# Patient Record
Sex: Male | Born: 1945 | Race: White | Hispanic: No | Marital: Married | Smoking: Former smoker
Health system: Southern US, Community
[De-identification: ages and names within clinical notes are randomized; demographics above are authoritative.]

## PROBLEM LIST (undated history)

## (undated) DIAGNOSIS — I340 Nonrheumatic mitral (valve) insufficiency: Secondary | ICD-10-CM

## (undated) DIAGNOSIS — C679 Malignant neoplasm of bladder, unspecified: Secondary | ICD-10-CM

## (undated) DIAGNOSIS — Z860100 Personal history of colon polyps, unspecified: Secondary | ICD-10-CM

## (undated) DIAGNOSIS — I739 Peripheral vascular disease, unspecified: Secondary | ICD-10-CM

## (undated) DIAGNOSIS — E559 Vitamin D deficiency, unspecified: Secondary | ICD-10-CM

## (undated) DIAGNOSIS — G4733 Obstructive sleep apnea (adult) (pediatric): Secondary | ICD-10-CM

## (undated) DIAGNOSIS — E785 Hyperlipidemia, unspecified: Secondary | ICD-10-CM

## (undated) DIAGNOSIS — H9193 Unspecified hearing loss, bilateral: Secondary | ICD-10-CM

## (undated) DIAGNOSIS — I480 Paroxysmal atrial fibrillation: Secondary | ICD-10-CM

## (undated) DIAGNOSIS — Z9989 Dependence on other enabling machines and devices: Secondary | ICD-10-CM

## (undated) DIAGNOSIS — R7303 Prediabetes: Secondary | ICD-10-CM

## (undated) DIAGNOSIS — I10 Essential (primary) hypertension: Secondary | ICD-10-CM

## (undated) HISTORY — DX: Nonrheumatic mitral (valve) insufficiency: I34.0

## (undated) HISTORY — DX: Hyperlipidemia, unspecified: E78.5

## (undated) HISTORY — DX: Vitamin D deficiency, unspecified: E55.9

## (undated) HISTORY — PX: TONSILLECTOMY: SUR1361

## (undated) HISTORY — PX: CARDIAC ELECTROPHYSIOLOGY STUDY AND ABLATION: SHX1294

## (undated) HISTORY — PX: CATARACT EXTRACTION W/ INTRAOCULAR LENS  IMPLANT, BILATERAL: SHX1307

## (undated) HISTORY — DX: Prediabetes: R73.03

## (undated) HISTORY — PX: JOINT REPLACEMENT: SHX530

---

## 1998-05-14 ENCOUNTER — Encounter: Payer: Self-pay | Admitting: Internal Medicine

## 1998-05-14 ENCOUNTER — Ambulatory Visit (HOSPITAL_COMMUNITY): Admission: RE | Admit: 1998-05-14 | Discharge: 1998-05-14 | Payer: Self-pay | Admitting: Internal Medicine

## 1999-05-17 ENCOUNTER — Ambulatory Visit (HOSPITAL_COMMUNITY): Admission: RE | Admit: 1999-05-17 | Discharge: 1999-05-17 | Payer: Self-pay | Admitting: Internal Medicine

## 1999-05-17 ENCOUNTER — Encounter: Payer: Self-pay | Admitting: Internal Medicine

## 2001-07-06 ENCOUNTER — Encounter: Payer: Self-pay | Admitting: Internal Medicine

## 2001-07-06 ENCOUNTER — Ambulatory Visit (HOSPITAL_COMMUNITY): Admission: RE | Admit: 2001-07-06 | Discharge: 2001-07-06 | Payer: Self-pay | Admitting: Internal Medicine

## 2002-04-04 ENCOUNTER — Encounter: Payer: Self-pay | Admitting: Internal Medicine

## 2002-04-04 ENCOUNTER — Ambulatory Visit (HOSPITAL_COMMUNITY): Admission: RE | Admit: 2002-04-04 | Discharge: 2002-04-04 | Payer: Self-pay | Admitting: Internal Medicine

## 2007-11-08 ENCOUNTER — Ambulatory Visit (HOSPITAL_COMMUNITY): Admission: RE | Admit: 2007-11-08 | Discharge: 2007-11-08 | Payer: Self-pay | Admitting: Gastroenterology

## 2007-11-08 ENCOUNTER — Encounter (INDEPENDENT_AMBULATORY_CARE_PROVIDER_SITE_OTHER): Payer: Self-pay | Admitting: Gastroenterology

## 2008-07-05 ENCOUNTER — Ambulatory Visit: Payer: Self-pay | Admitting: Internal Medicine

## 2008-08-03 ENCOUNTER — Ambulatory Visit: Payer: Self-pay | Admitting: Internal Medicine

## 2009-06-08 ENCOUNTER — Ambulatory Visit: Payer: Self-pay | Admitting: Internal Medicine

## 2009-07-10 ENCOUNTER — Ambulatory Visit: Payer: Self-pay | Admitting: Internal Medicine

## 2009-10-01 ENCOUNTER — Encounter: Admission: RE | Admit: 2009-10-01 | Discharge: 2009-10-01 | Payer: Self-pay | Admitting: Orthopaedic Surgery

## 2009-11-28 ENCOUNTER — Ambulatory Visit: Payer: Self-pay | Admitting: Internal Medicine

## 2010-02-08 ENCOUNTER — Inpatient Hospital Stay (HOSPITAL_COMMUNITY): Admission: RE | Admit: 2010-02-08 | Discharge: 2010-02-11 | Payer: Self-pay | Admitting: Orthopaedic Surgery

## 2010-02-08 HISTORY — PX: TOTAL HIP ARTHROPLASTY: SHX124

## 2010-10-05 LAB — CBC
HCT: 34.9 % — ABNORMAL LOW (ref 39.0–52.0)
HCT: 37.4 % — ABNORMAL LOW (ref 39.0–52.0)
Hemoglobin: 12.4 g/dL — ABNORMAL LOW (ref 13.0–17.0)
MCH: 30.4 pg (ref 26.0–34.0)
MCHC: 34.2 g/dL (ref 30.0–36.0)
MCHC: 35.5 g/dL (ref 30.0–36.0)
Platelets: 197 10*3/uL (ref 150–400)
RBC: 3.94 MIL/uL — ABNORMAL LOW (ref 4.22–5.81)
RBC: 3.97 MIL/uL — ABNORMAL LOW (ref 4.22–5.81)
RBC: 4.21 MIL/uL — ABNORMAL LOW (ref 4.22–5.81)
RDW: 12.8 % (ref 11.5–15.5)
RDW: 13 % (ref 11.5–15.5)
WBC: 10.1 10*3/uL (ref 4.0–10.5)

## 2010-10-05 LAB — BASIC METABOLIC PANEL
CO2: 27 mEq/L (ref 19–32)
Calcium: 8.2 mg/dL — ABNORMAL LOW (ref 8.4–10.5)
Calcium: 8.3 mg/dL — ABNORMAL LOW (ref 8.4–10.5)
Chloride: 102 mEq/L (ref 96–112)
Chloride: 103 mEq/L (ref 96–112)
Chloride: 105 mEq/L (ref 96–112)
Creatinine, Ser: 0.88 mg/dL (ref 0.4–1.5)
Creatinine, Ser: 0.9 mg/dL (ref 0.4–1.5)
GFR calc non Af Amer: >60 mL/min (ref >60)
Glucose, Bld: 121 mg/dL — ABNORMAL HIGH (ref 70–99)
Glucose, Bld: 147 mg/dL — ABNORMAL HIGH (ref 70–99)
Potassium: 3.4 mEq/L — ABNORMAL LOW (ref 3.5–5.1)
Sodium: 138 mEq/L (ref 135–145)
Sodium: 138 mEq/L (ref 135–145)
Sodium: 138 mEq/L (ref 135–145)

## 2010-10-05 LAB — PROTIME-INR: Prothrombin Time: 18.3 seconds — ABNORMAL HIGH (ref 11.6–15.2)

## 2010-10-06 LAB — CBC
MCH: 30.9 pg (ref 26.0–34.0)
MCHC: 34.5 g/dL (ref 30.0–36.0)
MCV: 89.5 fL (ref 78.0–100.0)
WBC: 6.8 10*3/uL (ref 4.0–10.5)

## 2010-10-06 LAB — URINALYSIS, ROUTINE W REFLEX MICROSCOPIC
Bilirubin Urine: NEGATIVE
Hgb urine dipstick: NEGATIVE
Ketones, ur: NEGATIVE mg/dL
Nitrite: NEGATIVE
Specific Gravity, Urine: 1.021 (ref 1.005–1.030)

## 2010-10-06 LAB — DIFFERENTIAL
Basophils Relative: 1 % (ref 0–1)
Eosinophils Absolute: 0.3 10*3/uL (ref 0.0–0.7)
Eosinophils Relative: 4 % (ref 0–5)
Monocytes Absolute: 0.6 10*3/uL (ref 0.1–1.0)

## 2010-10-06 LAB — BASIC METABOLIC PANEL
CO2: 26 mEq/L (ref 19–32)
Calcium: 9.3 mg/dL (ref 8.4–10.5)
Chloride: 108 mEq/L (ref 96–112)
GFR calc Af Amer: >60 mL/min (ref >60)
GFR calc non Af Amer: >60 mL/min (ref >60)
Glucose, Bld: 91 mg/dL (ref 70–99)

## 2010-10-06 LAB — SURGICAL PCR SCREEN
MRSA, PCR: NEGATIVE
Staphylococcus aureus: NEGATIVE

## 2010-10-06 LAB — PROTIME-INR
INR: 1.03 (ref 0.00–1.49)
Prothrombin Time: 13.4 seconds (ref 11.6–15.2)

## 2011-06-10 ENCOUNTER — Ambulatory Visit (HOSPITAL_COMMUNITY)
Admission: RE | Admit: 2011-06-10 | Discharge: 2011-06-10 | Disposition: A | Discharge status: Home / Self Care | Payer: Medicare Other | Source: Ambulatory Visit | Attending: Internal Medicine | Admitting: Internal Medicine

## 2011-06-10 ENCOUNTER — Other Ambulatory Visit (HOSPITAL_COMMUNITY): Payer: Self-pay | Admitting: Internal Medicine

## 2011-06-10 DIAGNOSIS — I10 Essential (primary) hypertension: Secondary | ICD-10-CM

## 2011-06-10 DIAGNOSIS — R0902 Hypoxemia: Secondary | ICD-10-CM | POA: Insufficient documentation

## 2011-06-17 ENCOUNTER — Other Ambulatory Visit (HOSPITAL_COMMUNITY): Payer: Self-pay | Admitting: Orthopaedic Surgery

## 2011-07-11 ENCOUNTER — Encounter (HOSPITAL_COMMUNITY): Payer: Self-pay

## 2011-07-11 ENCOUNTER — Encounter (HOSPITAL_COMMUNITY)
Admission: RE | Admit: 2011-07-11 | Discharge: 2011-07-11 | Payer: Medicare Other | Source: Ambulatory Visit | Attending: Orthopaedic Surgery | Admitting: Orthopaedic Surgery

## 2011-07-11 HISTORY — DX: Essential (primary) hypertension: I10

## 2011-07-11 LAB — CBC
HCT: 42.1 % (ref 39.0–52.0)
Hemoglobin: 14.3 g/dL (ref 13.0–17.0)
MCH: 29.7 pg (ref 26.0–34.0)
MCHC: 34 g/dL (ref 30.0–36.0)

## 2011-07-11 LAB — URINALYSIS, ROUTINE W REFLEX MICROSCOPIC
Bilirubin Urine: NEGATIVE
Glucose, UA: NEGATIVE mg/dL
Hgb urine dipstick: NEGATIVE
Ketones, ur: NEGATIVE mg/dL
Protein, ur: NEGATIVE mg/dL

## 2011-07-11 LAB — BASIC METABOLIC PANEL
BUN: 13 mg/dL (ref 6–23)
GFR calc non Af Amer: 89 mL/min — ABNORMAL LOW (ref >90)
Glucose, Bld: 83 mg/dL (ref 70–99)
Potassium: 3.9 mEq/L (ref 3.5–5.1)

## 2011-07-11 LAB — PROTIME-INR: INR: 1.19 (ref 0.00–1.49)

## 2011-07-11 NOTE — Patient Instructions (Signed)
20 ANIK WESCH  07/11/2011   Your procedure is scheduled on: 07-25-11  Report to [facility] at 0800 AM.  Call this number if you have problems the morning of surgery: [phone]   Remember:   Do not eat food:After Midnight.  May have clear liquids:until Midnight .  Clear liquids include soda, tea, black coffee, apple or grape juice, broth.  Take these medicines the morning of surgery with A SIP OF WATER: none.Bring Cpap mask to hospital.   Do not wear jewelry, make-up or nail polish.  Do not wear lotions, powders, or perfumes. You may wear deodorant.  Do not shave 48 hours prior to surgery.  Do not bring valuables to the hospital.  Contacts, dentures or bridgework may not be worn into surgery.  Leave suitcase in the car. After surgery it may be brought to your room.  For patients admitted to the hospital, checkout time is 11:00 AM the day of discharge.   Patients discharged the day of surgery will not be allowed to drive home.  Name and phone number of your driver: spouse  Special Instructions: CHG Shower Use Special Wash: 1/2 bottle night before surgery and 1/2 bottle morning of surgery.   Please read over the following fact sheets that you were given: Blood Transfusion Information and MRSA Information

## 2011-07-11 NOTE — Pre-Procedure Instructions (Signed)
07-11-11 ION(6'29) report with chart.

## 2011-07-12 ENCOUNTER — Other Ambulatory Visit (HOSPITAL_COMMUNITY): Payer: Medicare Other

## 2011-07-25 ENCOUNTER — Encounter (HOSPITAL_COMMUNITY): Payer: Self-pay | Admitting: *Deleted

## 2011-07-25 ENCOUNTER — Encounter (HOSPITAL_COMMUNITY): Payer: Self-pay | Admitting: Anesthesiology

## 2011-07-25 ENCOUNTER — Encounter (HOSPITAL_COMMUNITY)
Admission: RE | Disposition: A | Discharge status: Home Health | Payer: Self-pay | Source: Ambulatory Visit | Attending: Orthopaedic Surgery

## 2011-07-25 ENCOUNTER — Inpatient Hospital Stay (HOSPITAL_COMMUNITY)
Admission: RE | Admit: 2011-07-25 | Discharge: 2011-07-28 | DRG: 470 | Disposition: A | Discharge status: Home Health | Payer: Medicare Other | Source: Ambulatory Visit | Attending: Orthopaedic Surgery | Admitting: Orthopaedic Surgery

## 2011-07-25 ENCOUNTER — Inpatient Hospital Stay (HOSPITAL_COMMUNITY): Payer: Medicare Other | Admitting: Anesthesiology

## 2011-07-25 ENCOUNTER — Inpatient Hospital Stay (HOSPITAL_COMMUNITY): Payer: Medicare Other

## 2011-07-25 DIAGNOSIS — I10 Essential (primary) hypertension: Secondary | ICD-10-CM | POA: Diagnosis present

## 2011-07-25 DIAGNOSIS — H9190 Unspecified hearing loss, unspecified ear: Secondary | ICD-10-CM | POA: Diagnosis present

## 2011-07-25 DIAGNOSIS — I4891 Unspecified atrial fibrillation: Secondary | ICD-10-CM | POA: Diagnosis present

## 2011-07-25 DIAGNOSIS — G4730 Sleep apnea, unspecified: Secondary | ICD-10-CM | POA: Diagnosis present

## 2011-07-25 DIAGNOSIS — M1610 Unilateral primary osteoarthritis, unspecified hip: Principal | ICD-10-CM | POA: Diagnosis present

## 2011-07-25 DIAGNOSIS — M169 Osteoarthritis of hip, unspecified: Secondary | ICD-10-CM

## 2011-07-25 HISTORY — PX: TOTAL HIP ARTHROPLASTY: SHX124

## 2011-07-25 LAB — ABO/RH: ABO/RH(D): O POS

## 2011-07-25 SURGERY — ARTHROPLASTY, HIP, TOTAL, ANTERIOR APPROACH
Anesthesia: General | Site: Hip | Laterality: Right | Wound class: Clean

## 2011-07-25 MED ORDER — DILTIAZEM HCL ER 120 MG PO CP24
120.0000 mg | ORAL_CAPSULE | Freq: Every evening | ORAL | Status: DC
  Administered 2011-07-25 – 2011-07-27 (×3): 120 mg via ORAL
  Filled 2011-07-25 (×4): qty 1

## 2011-07-25 MED ORDER — RIVAROXABAN 10 MG PO TABS
10.0000 mg | ORAL_TABLET | Freq: Every day | ORAL | Status: DC
  Administered 2011-07-26 – 2011-07-28 (×3): 10 mg via ORAL
  Filled 2011-07-25 (×3): qty 1

## 2011-07-25 MED ORDER — HYDROMORPHONE HCL PF 1 MG/ML IJ SOLN
INTRAMUSCULAR | Status: DC | PRN
  Administered 2011-07-25 (×2): 1 mg via INTRAVENOUS

## 2011-07-25 MED ORDER — METHOCARBAMOL 500 MG PO TABS
500.0000 mg | ORAL_TABLET | Freq: Four times a day (QID) | ORAL | Status: DC | PRN
  Administered 2011-07-26 – 2011-07-28 (×2): 500 mg via ORAL
  Filled 2011-07-25 (×2): qty 1

## 2011-07-25 MED ORDER — METOCLOPRAMIDE HCL 10 MG PO TABS: 5.0000 mg | ORAL_TABLET | Freq: Three times a day (TID) | ORAL | Status: DC | PRN

## 2011-07-25 MED ORDER — SENNA 8.6 MG PO TABS
1.0000 | ORAL_TABLET | Freq: Two times a day (BID) | ORAL | Status: DC
  Administered 2011-07-25 – 2011-07-28 (×6): 8.6 mg via ORAL
  Filled 2011-07-25 (×6): qty 1

## 2011-07-25 MED ORDER — OXYCODONE HCL 5 MG PO TABS: 5.0000 mg | ORAL_TABLET | ORAL | Status: DC | PRN

## 2011-07-25 MED ORDER — ACETAMINOPHEN 325 MG PO TABS: 650.0000 mg | ORAL_TABLET | Freq: Four times a day (QID) | ORAL | Status: DC | PRN

## 2011-07-25 MED ORDER — HETASTARCH-ELECTROLYTES 6 % IV SOLN
INTRAVENOUS | Status: DC | PRN
  Administered 2011-07-25: 11:00:00 via INTRAVENOUS

## 2011-07-25 MED ORDER — ZOLPIDEM TARTRATE 5 MG PO TABS: 5.0000 mg | ORAL_TABLET | Freq: Every evening | ORAL | Status: DC | PRN

## 2011-07-25 MED ORDER — CEFAZOLIN SODIUM 1-5 GM-% IV SOLN
1.0000 g | Freq: Four times a day (QID) | INTRAVENOUS | Status: AC
  Administered 2011-07-25 – 2011-07-26 (×3): 1 g via INTRAVENOUS
  Filled 2011-07-25 (×5): qty 50

## 2011-07-25 MED ORDER — ROCURONIUM BROMIDE 100 MG/10ML IV SOLN
INTRAVENOUS | Status: DC | PRN
  Administered 2011-07-25: 60 mg via INTRAVENOUS

## 2011-07-25 MED ORDER — ONDANSETRON HCL 4 MG/2ML IJ SOLN
4.0000 mg | Freq: Four times a day (QID) | INTRAMUSCULAR | Status: DC | PRN
Start: 2011-07-25 — End: 2011-07-28

## 2011-07-25 MED ORDER — ALUM & MAG HYDROXIDE-SIMETH 200-200-20 MG/5ML PO SUSP: 30.0000 mL | ORAL | Status: DC | PRN

## 2011-07-25 MED ORDER — PROPOFOL 10 MG/ML IV EMUL
INTRAVENOUS | Status: DC | PRN
  Administered 2011-07-25: 180 mg via INTRAVENOUS

## 2011-07-25 MED ORDER — ROSUVASTATIN CALCIUM 20 MG PO TABS
20.0000 mg | ORAL_TABLET | Freq: Every evening | ORAL | Status: DC
  Administered 2011-07-25 – 2011-07-27 (×3): 20 mg via ORAL
  Filled 2011-07-25 (×4): qty 1

## 2011-07-25 MED ORDER — CEFAZOLIN SODIUM-DEXTROSE 2-3 GM-% IV SOLR
2.0000 g | INTRAVENOUS | Status: AC
  Administered 2011-07-25: 2 g via INTRAVENOUS

## 2011-07-25 MED ORDER — NEOSTIGMINE METHYLSULFATE 1 MG/ML IJ SOLN
INTRAMUSCULAR | Status: DC | PRN
  Administered 2011-07-25: 4.6 mg via INTRAVENOUS

## 2011-07-25 MED ORDER — ONDANSETRON HCL 4 MG/2ML IJ SOLN: 4.0000 mg | Freq: Four times a day (QID) | INTRAMUSCULAR | Status: DC | PRN

## 2011-07-25 MED ORDER — HYDROMORPHONE HCL PF 1 MG/ML IJ SOLN
INTRAMUSCULAR | Status: AC
  Filled 2011-07-25: qty 1

## 2011-07-25 MED ORDER — DIPHENHYDRAMINE HCL 50 MG/ML IJ SOLN: 12.5000 mg | Freq: Four times a day (QID) | INTRAMUSCULAR | Status: DC | PRN

## 2011-07-25 MED ORDER — DIPHENHYDRAMINE HCL 12.5 MG/5ML PO ELIX: 12.5000 mg | ORAL_SOLUTION | ORAL | Status: DC | PRN

## 2011-07-25 MED ORDER — PHENOL 1.4 % MT LIQD: 1.0000 | OROMUCOSAL | Status: DC | PRN

## 2011-07-25 MED ORDER — NALOXONE HCL 0.4 MG/ML IJ SOLN: 0.4000 mg | INTRAMUSCULAR | Status: DC | PRN

## 2011-07-25 MED ORDER — ADULT MULTIVITAMIN W/MINERALS CH
1.0000 | ORAL_TABLET | Freq: Every day | ORAL | Status: DC
  Administered 2011-07-26 – 2011-07-28 (×3): 1 via ORAL
  Filled 2011-07-25 (×4): qty 1

## 2011-07-25 MED ORDER — LACTATED RINGERS IV SOLN
INTRAVENOUS | Status: DC
  Administered 2011-07-25: 12:00:00 via INTRAVENOUS
  Administered 2011-07-25: 1000 mL via INTRAVENOUS

## 2011-07-25 MED ORDER — BISOPROLOL-HYDROCHLOROTHIAZIDE 10-6.25 MG PO TABS
0.5000 | ORAL_TABLET | Freq: Every evening | ORAL | Status: DC
  Administered 2011-07-26 – 2011-07-27 (×2): 0.5 via ORAL
  Filled 2011-07-25 (×5): qty 1

## 2011-07-25 MED ORDER — SODIUM CHLORIDE 0.9 % IJ SOLN: 9.0000 mL | INTRAMUSCULAR | Status: DC | PRN

## 2011-07-25 MED ORDER — SODIUM CHLORIDE 0.9 % IV SOLN
INTRAVENOUS | Status: DC
  Administered 2011-07-25: 18:00:00 via INTRAVENOUS
  Administered 2011-07-26: 1000 mL via INTRAVENOUS
  Administered 2011-07-26 – 2011-07-27 (×2): via INTRAVENOUS
  Administered 2011-07-27: 1000 mL via INTRAVENOUS

## 2011-07-25 MED ORDER — 0.9 % SODIUM CHLORIDE (POUR BTL) OPTIME
TOPICAL | Status: DC | PRN
  Administered 2011-07-25: 1000 mL

## 2011-07-25 MED ORDER — MORPHINE SULFATE (PF) 1 MG/ML IV SOLN
INTRAVENOUS | Status: DC
  Administered 2011-07-25: 13:00:00 via INTRAVENOUS
  Administered 2011-07-26: 1.5 mg via INTRAVENOUS
  Administered 2011-07-26 (×3): 3 mg via INTRAVENOUS
  Administered 2011-07-27 – 2011-07-28 (×5): 1.5 mg via INTRAVENOUS
  Administered 2011-07-28: 07:00:00 via INTRAVENOUS
  Filled 2011-07-25 (×2): qty 25

## 2011-07-25 MED ORDER — ACETAMINOPHEN 10 MG/ML IV SOLN
INTRAVENOUS | Status: DC | PRN
  Administered 2011-07-25: 1000 mg via INTRAVENOUS

## 2011-07-25 MED ORDER — ONDANSETRON HCL 4 MG PO TABS: 4.0000 mg | ORAL_TABLET | Freq: Four times a day (QID) | ORAL | Status: DC | PRN

## 2011-07-25 MED ORDER — ACETAMINOPHEN 650 MG RE SUPP: 650.0000 mg | Freq: Four times a day (QID) | RECTAL | Status: DC | PRN

## 2011-07-25 MED ORDER — METOCLOPRAMIDE HCL 5 MG/ML IJ SOLN: 5.0000 mg | Freq: Three times a day (TID) | INTRAMUSCULAR | Status: DC | PRN

## 2011-07-25 MED ORDER — LIDOCAINE HCL (CARDIAC) 20 MG/ML IV SOLN
INTRAVENOUS | Status: DC | PRN
  Administered 2011-07-25: 100 mg via INTRAVENOUS

## 2011-07-25 MED ORDER — MENTHOL 3 MG MT LOZG: 1.0000 | LOZENGE | OROMUCOSAL | Status: DC | PRN

## 2011-07-25 MED ORDER — HYDROMORPHONE HCL PF 1 MG/ML IJ SOLN
0.2500 mg | INTRAMUSCULAR | Status: DC | PRN
  Administered 2011-07-25 (×2): 0.5 mg via INTRAVENOUS

## 2011-07-25 MED ORDER — HYDROCODONE-ACETAMINOPHEN 5-325 MG PO TABS
1.0000 | ORAL_TABLET | ORAL | Status: DC | PRN
  Administered 2011-07-26: 1 via ORAL
  Filled 2011-07-25: qty 1

## 2011-07-25 MED ORDER — PROMETHAZINE HCL 25 MG/ML IJ SOLN: 6.2500 mg | INTRAMUSCULAR | Status: DC | PRN

## 2011-07-25 MED ORDER — METHOCARBAMOL 100 MG/ML IJ SOLN
500.0000 mg | Freq: Four times a day (QID) | INTRAVENOUS | Status: DC | PRN
  Administered 2011-07-25: 500 mg via INTRAVENOUS
  Filled 2011-07-25: qty 5

## 2011-07-25 MED ORDER — SUFENTANIL CITRATE 50 MCG/ML IV SOLN
INTRAVENOUS | Status: DC | PRN
  Administered 2011-07-25: 15 ug via INTRAVENOUS
  Administered 2011-07-25 (×2): 10 ug via INTRAVENOUS
  Administered 2011-07-25: 5 ug via INTRAVENOUS
  Administered 2011-07-25: 10 ug via INTRAVENOUS

## 2011-07-25 MED ORDER — DIPHENHYDRAMINE HCL 12.5 MG/5ML PO ELIX
12.5000 mg | ORAL_SOLUTION | Freq: Four times a day (QID) | ORAL | Status: DC | PRN
  Filled 2011-07-25: qty 5

## 2011-07-25 MED ORDER — MORPHINE SULFATE 2 MG/ML IJ SOLN: 1.0000 mg | INTRAMUSCULAR | Status: DC | PRN

## 2011-07-25 SURGICAL SUPPLY — 34 items
BAG ZIPLOCK 12X15 (MISCELLANEOUS) ×2 IMPLANT
BLADE SAW SGTL 18X1.27X75 (BLADE) ×2 IMPLANT
CELLS DAT CNTRL 66122 CELL SVR (MISCELLANEOUS) ×1 IMPLANT
CLOTH BEACON ORANGE TIMEOUT ST (SAFETY) ×2 IMPLANT
DRAPE C-ARM 42X72 X-RAY (DRAPES) ×2 IMPLANT
DRAPE STERI IOBAN 125X83 (DRAPES) ×2 IMPLANT
DRAPE U-SHAPE 47X51 STRL (DRAPES) ×6 IMPLANT
DRSG MEPILEX BORDER 4X8 (GAUZE/BANDAGES/DRESSINGS) ×2 IMPLANT
DURAPREP 26ML APPLICATOR (WOUND CARE) ×2 IMPLANT
ELECT BLADE TIP CTD 4 INCH (ELECTRODE) ×2 IMPLANT
ELECT REM PT RETURN 9FT ADLT (ELECTROSURGICAL) ×2
ELECTRODE REM PT RTRN 9FT ADLT (ELECTROSURGICAL) ×1 IMPLANT
FACESHIELD LNG OPTICON STERILE (SAFETY) ×6 IMPLANT
GAUZE XEROFORM 1X8 LF (GAUZE/BANDAGES/DRESSINGS) ×2 IMPLANT
GLOVE BIO SURGEON STRL SZ7 (GLOVE) IMPLANT
GLOVE BIO SURGEON STRL SZ7.5 (GLOVE) ×2 IMPLANT
GLOVE BIOGEL PI IND STRL 7.5 (GLOVE) IMPLANT
GLOVE BIOGEL PI IND STRL 8 (GLOVE) ×1 IMPLANT
GLOVE BIOGEL PI INDICATOR 7.5 (GLOVE)
GLOVE BIOGEL PI INDICATOR 8 (GLOVE) ×1
GLOVE ECLIPSE 7.0 STRL STRAW (GLOVE) IMPLANT
GOWN STRL REIN XL XLG (GOWN DISPOSABLE) ×4 IMPLANT
KIT BASIN OR (CUSTOM PROCEDURE TRAY) ×2 IMPLANT
PACK TOTAL JOINT (CUSTOM PROCEDURE TRAY) ×2 IMPLANT
PADDING CAST COTTON 6X4 STRL (CAST SUPPLIES) ×2 IMPLANT
RTRCTR WOUND ALEXIS 18CM MED (MISCELLANEOUS) ×2
STAPLER SKIN PROX WIDE 3.9 (STAPLE) ×2 IMPLANT
SUT ETHIBOND NAB CT1 #1 30IN (SUTURE) ×2 IMPLANT
SUT VIC AB 1 CT1 36 (SUTURE) ×2 IMPLANT
SUT VIC AB 2-0 CT1 27 (SUTURE) ×1
SUT VIC AB 2-0 CT1 TAPERPNT 27 (SUTURE) ×1 IMPLANT
TOWEL OR 17X26 10 PK STRL BLUE (TOWEL DISPOSABLE) ×4 IMPLANT
TOWEL OR NON WOVEN STRL DISP B (DISPOSABLE) ×2 IMPLANT
TRAY FOLEY CATH 14FRSI W/METER (CATHETERS) ×2 IMPLANT

## 2011-07-25 NOTE — Anesthesia Preprocedure Evaluation (Signed)
Anesthesia Evaluation  Patient identified by MRN, date of birth, ID band Patient awake    Reviewed: Allergy & Precautions, H&P , NPO status , Patient's Chart, lab work & pertinent test results, reviewed documented beta blocker date and time   Airway Mallampati: II TM Distance: >3 FB Neck ROM: Full    Dental  (+) Teeth Intact   Pulmonary sleep apnea and Continuous Positive Airway Pressure Ventilation ,  clear to auscultation        Cardiovascular hypertension, Pt. on medications + dysrhythmias Atrial Fibrillation Regular Normal Cardioversion X2 for A Fib   Neuro/Psych Negative Neurological ROS  Negative Psych ROS   GI/Hepatic negative GI ROS, Neg liver ROS,   Endo/Other  Negative Endocrine ROS  Renal/GU negative Renal ROS  Genitourinary negative   Musculoskeletal negative musculoskeletal ROS (+)   Abdominal   Peds negative pediatric ROS (+)  Hematology negative hematology ROS (+)   Anesthesia Other Findings   Reproductive/Obstetrics negative OB ROS                           Anesthesia Physical Anesthesia Plan  ASA: III  Anesthesia Plan: General   Post-op Pain Management:    Induction: Intravenous  Airway Management Planned: Oral ETT  Additional Equipment:   Intra-op Plan:   Post-operative Plan: Extubation in OR  Informed Consent: I have reviewed the patients History and Physical, chart, labs and discussed the procedure including the risks, benefits and alternatives for the proposed anesthesia with the patient or authorized representative who has indicated his/her understanding and acceptance.     Plan Discussed with: CRNA and Surgeon  Anesthesia Plan Comments:         Anesthesia Quick Evaluation

## 2011-07-25 NOTE — Transfer of Care (Signed)
Immediate Anesthesia Transfer of Care Note  Patient: Damon [name]  Procedure(s) Performed:  TOTAL HIP ARTHROPLASTY ANTERIOR APPROACH  Patient Location: PACU  Anesthesia Type: General  Level of Consciousness: awake, alert , oriented and patient cooperative  Airway & Oxygen Therapy: Patient Spontanous Breathing and Patient connected to face mask oxygen  Post-op Assessment: Report given to PACU RN and Post -op Vital signs reviewed and stable  Post vital signs: stable  Complications: No apparent anesthesia complications

## 2011-07-25 NOTE — Progress Notes (Signed)
Utilization review completed.  

## 2011-07-25 NOTE — Plan of Care (Signed)
Problem: Consults Goal: Diagnosis- Total Joint Replacement Right anterior hip replacement     

## 2011-07-25 NOTE — Anesthesia Postprocedure Evaluation (Signed)
  Anesthesia Post-op Note  Patient: Damon [name]  Procedure(s) Performed:  TOTAL HIP ARTHROPLASTY ANTERIOR APPROACH  Patient Location: PACU  Anesthesia Type: General  Level of Consciousness: oriented and sedated  Airway and Oxygen Therapy: Patient Spontanous Breathing and Patient connected to nasal cannula oxygen  Post-op Pain: mild  Post-op Assessment: Post-op Vital signs reviewed, Patient's Cardiovascular Status Stable, Respiratory Function Stable and Patent Airway  Post-op Vital Signs: stable  Complications: No apparent anesthesia complications

## 2011-07-25 NOTE — Brief Op Note (Signed)
07/25/2011  11:59 AM  PATIENT:  Damon [name]  66 y.o. male  PRE-OPERATIVE DIAGNOSIS:  Severe Osteoarthritis right hip  POST-OPERATIVE DIAGNOSIS:  Severe Osteoarthritis right hip  PROCEDURE:  Procedure(s): TOTAL HIP ARTHROPLASTY ANTERIOR APPROACH  SURGEON:  Surgeon(s): Kathryne Hitch  PHYSICIAN ASSISTANT:   ASSISTANTS: none   ANESTHESIA:   general  EBL:  Total I/O In: 1000 [I.V.:1000] Out: 700 [Urine:150; Blood:550]  BLOOD ADMINISTERED:none  DRAINS: none   LOCAL MEDICATIONS USED:  NONE  SPECIMEN:  No Specimen  DISPOSITION OF SPECIMEN:  N/A  COUNTS:  YES  TOURNIQUET:  * No tourniquets in log *  DICTATION: .Other Dictation: Dictation Number [id number]  PLAN OF CARE: Admit to inpatient   PATIENT DISPOSITION:  PACU - hemodynamically stable.   Delay start of Pharmacological VTE agent (>24hrs) due to surgical blood loss or risk of bleeding:  {YES/NO/NOT APPLICABLE:20182

## 2011-07-25 NOTE — H&P (Signed)
Damon [name] is an 66 y.o. male.   Chief Complaint:   Right hip pain with know end-stage arthritis HPI:   66 yo male with bilateral hip arthritis.  Underwent successful left total hip replacment over a year ago.  Now presents to have the right hip replaced.  Has failed conservative treatment up to this point with NSAIDs, injections, rest and time.  He understands fully the risks and benefits of surgery.  Past Medical History  Diagnosis Date  . Hypertension   . Dysrhythmia 07-11-11    Dx. Paroxysmal A. Fib.-Cardioverted x3  . Sleep apnea 07-11-11    Cpap used nightly most times  . Colon polyps 07-11-11    past hx. x 2 (benign)  . Arthritis 07-11-11    Osteoarthritis-hips, s/p LTHA, now rt. planned  . Hearing loss 07-11-11    Bilaterally    Past Surgical History  Procedure Date  . Joint replacement 07-11-11    LTHA  . Cataract extraction, bilateral 07-11-11    also Lasik surgery  . Tonsillectomy 07-11-11    child  . Cardioversion 07-11-11    x3 due to hx. A. Fib    No family history on file. Social History:  reports that he quit smoking about 29 years ago. He does not have any smokeless tobacco history on file. He reports that he does not drink alcohol or use illicit drugs.  Allergies: No Known Allergies  No current facility-administered medications on file as of .   No current outpatient prescriptions on file as of .    No results found for this or any previous visit (from the past 48 hour(s)). No results found.  Review of Systems  All other systems reviewed and are negative.    There were no vitals taken for this visit. Physical Exam  Constitutional: He is oriented to person, place, and time. He appears well-developed and well-nourished.  HENT:  Head: Normocephalic and atraumatic.  Eyes: EOM are normal. Pupils are equal, round, and reactive to light.  Neck: Normal range of motion. Neck supple.  Cardiovascular: Normal rate and regular rhythm.   Respiratory:  Effort normal and breath sounds normal.  GI: Soft. Bowel sounds are normal.  Musculoskeletal:       Right hip: He exhibits decreased range of motion, decreased strength, bony tenderness and crepitus.  Neurological: He is alert and oriented to person, place, and time.  Skin: Skin is warm and dry.  Psychiatric: He has a normal mood and affect.     Assessment/Plan To the OR today for a right total hip replacement then admission as an inpatient.  Damon [name] Y 07/25/2011, 7:05 AM

## 2011-07-25 NOTE — Anesthesia Procedure Notes (Signed)
Performed by: Thedore Pickel       

## 2011-07-26 LAB — BASIC METABOLIC PANEL
BUN: 13 mg/dL (ref 6–23)
CO2: 22 mEq/L (ref 19–32)
Calcium: 8.8 mg/dL (ref 8.4–10.5)
Chloride: 104 mEq/L (ref 96–112)
Creatinine, Ser: 0.8 mg/dL (ref 0.50–1.35)

## 2011-07-26 LAB — CBC
HCT: 36.8 % — ABNORMAL LOW (ref 39.0–52.0)
MCH: 29.6 pg (ref 26.0–34.0)
MCV: 86.6 fL (ref 78.0–100.0)
Platelets: 225 10*3/uL (ref 150–400)
RBC: 4.25 MIL/uL (ref 4.22–5.81)
RDW: 12.8 % (ref 11.5–15.5)
WBC: 13.9 10*3/uL — ABNORMAL HIGH (ref 4.0–10.5)

## 2011-07-26 NOTE — Progress Notes (Signed)
Physical Therapy Treatment Patient Details Name: Damon [name] MRN: [id number] DOB: 1946-03-28 Today's Date: 07/26/2011 1115-1130 PT Assessment/Plan  PT - Assessment/Plan Comments on Treatment Session: pt tolerated a second walk well. Pt states he feels discomfort in the HIP, more so than the thigh during ambulation, O/W does not have same discomfort at rest PT Plan: Discharge plan remains appropriate;Frequency remains appropriate PT Frequency: 7X/week Follow Up Recommendations: Home health PT;24 hour supervision/assistance Equipment Recommended: 3 in 1 bedside comode PT Goals  Acute Rehab PT Goals PT Goal Formulation: With patient/family Time For Goal Achievement: 7 days Pt will go Supine/Side to Sit: with supervision;with HOB 0 degrees PT Goal: Supine/Side to Sit - Progress: Progressing toward goal Pt will go Sit to Supine/Side: with supervision PT Goal: Sit to Supine/Side - Progress: Progressing toward goal Pt will go Sit to Stand: with supervision PT Goal: Sit to Stand - Progress: Progressing toward goal Pt will go Stand to Sit: with supervision PT Goal: Stand to Sit - Progress: Progressing toward goal Pt will Go Up / Down Stairs: with supervision;with rolling walker Pt will Perform Home Exercise Program: with supervision, verbal cues required/provided  PT Treatment Precautions/Restrictions  Restrictions Weight Bearing Restrictions: No Mobility (including Balance) Transfers Transfers: Yes Sit to Stand: 4: Min assist;With upper extremity assist;From chair/3-in-1 Sit to Stand Details (indicate cue type and reason): pt able to perform with proper technique Stand to Sit: 4: Min assist;To chair/3-in-1;With upper extremity assist Ambulation/Gait Ambulation/Gait: Yes Ambulation/Gait Assistance: 4: Min assist Ambulation Distance (Feet): 200 Feet Assistive device: Rolling walker Gait Pattern: Step-through pattern    Exercise   End of Session PT - End of Session Activity  Tolerance: Patient tolerated treatment well Patient left: in chair;with call bell in reach;with family/visitor present Nurse Communication: Mobility status for transfers General Behavior During Session: [facility] for tasks performed Cognition: [facility] for tasks performed  [name] 07/26/2011, 3:51 PM

## 2011-07-26 NOTE — Op Note (Signed)
Damon [name], Damon [name]              ACCOUNT NO.:  [id number]  MEDICAL RECORD NO.:  [id number]  LOCATION:  1605                         FACILITY:  [facility]  PHYSICIAN:  Vanita Panda. Magnus Ivan, M.D.DATE OF BIRTH:  04/01/1946  DATE OF PROCEDURE:  07/25/2011 DATE OF DISCHARGE:                              OPERATIVE REPORT   PREOPERATIVE DIAGNOSIS:  End-stage osteoarthritis and degenerative joint disease, right hip.  POSTOPERATIVE DIAGNOSIS:  End-stage osteoarthritis and degenerative joint disease, right hip.  PROCEDURE:  Right total hip arthroplasty through direct anterior approach.  IMPLANTS:  Size 52 sector acetabular component with Gription from DePuy, size 36 + 4 neutral polyethylene liner, size 13 KLA Corail femoral component, size 36 + 1.5 ceramic hip ball.  SURGEON:  Vanita Panda. Magnus Ivan, MD  ANESTHESIA:  General.  ANTIBIOTICS:  2 g IV Ancef.  BLOOD LOSS:  550 cc.  COMPLICATIONS:  None.  INDICATIONS:  [name] is a 66 year old gentleman with bilateral degenerative arthritis of his hips.  He underwent a successful left total hip arthroplasty over a year ago.  He now presents to have the right hip replaced.  It has gotten worse to affect his activities of daily living.  He has radiographic evidence of bone on bone wear.  He has debilitating pain and he wishes to proceed with a total hip arthroplasty on the right side given the success on the left.  PROCEDURE DESCRIPTION:  After informed consent obtained, appropriate right hip was marked.  He was brought to the operating room.  General anesthesia was obtained while he was on the stretcher.  Foley catheter was placed and then traction boots placed on his feet.  He was then positioned on the Hana fracture table with the perineal post in place and both legs in inline skeletal traction, but no traction applied.  His right hip was then prepped and draped with DuraPrep and sterile drapes. A time-out was called and he was  identified as the correct patient and correct right hip.  I then made an incision just distal and posterior to the anterior superior iliac spine and carried this obliquely down the leg.  I dissected down to the tensor fascia lata and divided the tensor fascia lata longitudinally.  I proceeded with a direct anterior approach to the hip at that point.  A cobra retractor was placed around the lateral neck followed by teasing medial retractor underneath the rectus femoris muscle.  I then coagulated the lateral femoral circumflex vessels and then divided the hip capsule to expose the femoral neck and head.  I used an oscillating saw to cut the femoral neck proximal to the lesser trochanter and completed this cut with an osteotome.  I then placed a cork screw guide within the femoral head and removed the femoral head in its entirety.  I cleaned the acetabulum off debris and then began reaming in 2 mm increments from size 44 up to a size 50.  I then reamed size 50 and 51 under direct fluoroscopic guidance.  I then placed a real size 52 acetabular component under direct visualization and direct fluoroscopic guidance allowing for appropriate inclination, abduction and anteversion of the cup.  I then placed the real  polyethylene size 36 liner and proceeded with the femur.  All traction was off the leg and the leg was externally rotated to 90 degrees, extended and adducted.  I placed retractors medially and behind the greater trochanter.  I released the tissue from behind the greater trochanter and again exposure to the femoral canal.  I opened this up with a box cutting guide followed by a rongeur.  I then began broaching from a size 8, broached all the way up to size 13 and once the 13 was felt to be stable just like the other side, I placed the high offset neck and a 36 + 1.5 head and reduced this in the acetabulum.  This was stable on rotation with minimal Shuck.  His leg lengths were near  equal and radiographic guidance and assessment under fluoroscopy.  I then removed all trial instrumentation and placed the real HA coated femoral component with a collar size 13 with high offset followed by the real 36 + 1.5 ceramic hip ball.  We reduced this back in the acetabulum again. I then copiously irrigated the tissues in normal saline solution and closed the joint capsule with interrupted #1 Ethibond followed by running #1 Vicryl in the tensor fascia lata, 2-0 Vicryl in subcutaneous tissue and interrupted staples on the skin.  Xeroform followed.  A well- padded sterile dressing was applied.  The patient was awakened, extubated, taken to the recovery room in stable condition.  All final counts were correct.  No known complications noted.     Vanita Panda. Magnus Ivan, M.D.     CYB/MEDQ  D:  07/25/2011  T:  07/26/2011  Job:  [id number]

## 2011-07-26 NOTE — Plan of Care (Signed)
Patient uses a cpap at home and was upset that no one put it on him last night despite him asking multiple times. The patient's night time Rn reported that she called respiratory multiple times and got no response. I called respiratory and spoke with Deb who told me that there was no order for a cpap in the computer. I left a "sticky note" in the computer for the MD to address this. After MD had left the floor I found that there was nothing addressing the CPAP. I called Dr. Magnus Ivan and he gave a verbal order for a cpap.  Sheran Luz Rn BSN

## 2011-07-26 NOTE — Progress Notes (Signed)
Subjective: 1 Day Post-Op Procedure(s) (LRB): TOTAL HIP ARTHROPLASTY ANTERIOR APPROACH (Right) Patient reports pain as mild.    Objective: Vital signs in last 24 hours: Temp:  [97.3 F (36.3 C)-99.1 F (37.3 C)] 99 F (37.2 C) (01/05 0546) Pulse Rate:  [56-89] 72  (01/05 0546) Resp:  [7-16] 16  (01/05 0546) BP: (116-166)/(67-80) 122/72 mmHg (01/05 0546) SpO2:  [92 %-100 %] 97 % (01/05 0546) FiO2 (%):  [2 %] 2 % (01/04 2000) Weight:  [92.987 kg (205 lb)] 205 lb (92.987 kg) (01/04 1400)  Intake/Output from previous day: 01/04 0701 - 01/05 0700 In: 5000 [P.O.:1800; I.V.:2600; IV Piggyback:600] Out: 3500 [Urine:2850; Emesis/NG output:100; Blood:550] Intake/Output this shift:     Basename 07/26/11 0435  HGB 12.6*    Basename 07/26/11 0435  WBC 13.9*  RBC 4.25  HCT 36.8*  PLT 225    Basename 07/26/11 0435  NA 137  K 3.9  CL 104  CO2 22  BUN 13  CREATININE 0.80  GLUCOSE 142*  CALCIUM 8.8   No results found for this basename: LABPT:2,INR:2 in the last 72 hours  Sensation intact distally Intact pulses distally Dorsiflexion/Plantar flexion intact Incision: scant drainage  Assessment/Plan: 1 Day Post-Op Procedure(s) (LRB): TOTAL HIP ARTHROPLASTY ANTERIOR APPROACH (Right) Up with therapy  Abigaelle Verley Y 07/26/2011, 11:07 AM

## 2011-07-26 NOTE — Progress Notes (Signed)
Physical Therapy Treatment Patient Details Name: Damon [name] MRN: [id number] DOB: 01/11/1946 Today's Date: 07/26/2011 8295-6213 1g PT Assessment/Plan1g  PT - Assessment/Plan Comments on Treatment Session: pt tolerated very well, continues to express concern for feeling of hip pain that he does not recall from  previous surgery PT Plan: Discharge plan remains appropriate;Frequency remains appropriate PT Frequency: 7X/week Follow Up Recommendations: Home health PT;24 hour supervision/assistance Equipment Recommended: 3 in 1 bedside comode PT Goals  Acute Rehab PT Goals PT Goal Formulation: With patient/family Time For Goal Achievement: 7 days Pt will go Supine/Side to Sit: with supervision;with HOB 0 degrees PT Goal: Supine/Side to Sit - Progress: Progressing toward goal Pt will go Sit to Supine/Side: with supervision PT Goal: Sit to Supine/Side - Progress: Progressing toward goal Pt will go Sit to Stand: with supervision PT Goal: Sit to Stand - Progress: Progressing toward goal Pt will go Stand to Sit: with supervision PT Goal: Stand to Sit - Progress: Progressing toward goal Pt will Go Up / Down Stairs: with supervision;with rolling walker Pt will Perform Home Exercise Program: with supervision, verbal cues required/provided  PT Treatment Precautions/Restrictions  Restrictions Weight Bearing Restrictions: No Mobility (including Balance) Bed Mobility Bed Mobility: Yes Supine to Sit: 4: Min assist;HOB elevated (Comment degrees)@ 40 degrees Sitting - Scoot to Edge of Bed: 5: Supervision Sit to Supine - Right: 4: Min assist Sit to Supine - Right Details (indicate cue type and reason): assist for RLE onto bed Transfers Transfers: Yes Sit to Stand: 4: Min assist;From elevated surface;With upper extremity assist;From bed Sit to Stand Details (indicate cue type and reason): pt able to perform with proper technique Stand to Sit: 4: Min assist;To bed;With upper extremity  assist Ambulation/Gait Ambulation/Gait: Yes Ambulation/Gait Assistance: 4: Min assist Ambulation Distance (Feet): 200 Feet Assistive device: Rolling walker Gait Pattern: Step-through pattern    Exercise   End of Session PT - End of Session Equipment Utilized During Treatment: Gait belt Activity Tolerance: Patient tolerated treatment well Patient left: in bed;with call bell in reach;with family/visitor present Nurse Communication: Mobility status for transfers General Behavior During Session: [facility] for tasks performed Cognition: [facility] for tasks performed  [name] 07/26/2011, 3:59 PM

## 2011-07-26 NOTE — Progress Notes (Signed)
Pt placed on CPAP at home setting of 12 with 2 L/M bleed-in.  Pt requested a ramp time like home machine of atleast 20 minutes.  Pt tolerating well and encouraged to notify RN/ RT of any concerns.

## 2011-07-26 NOTE — Progress Notes (Signed)
Physical Therapy Evaluation Patient Details Name: Damon [name] MRN: [id number] DOB: 1946/01/24 Today's Date: 07/26/2011 [id number] ev2 Problem List:  Patient Active Problem List  Diagnoses  . Degenerative arthritis of hip    Past Medical History:  Past Medical History  Diagnosis Date  . Hypertension   . Dysrhythmia 07-11-11    Dx. Paroxysmal A. Fib.-Cardioverted x3  . Sleep apnea 07-11-11    Cpap used nightly most times  . Colon polyps 07-11-11    past hx. x 2 (benign)  . Arthritis 07-11-11    Osteoarthritis-hips, s/p LTHA, now rt. planned  . Hearing loss 07-11-11    Bilaterally   Past Surgical History:  Past Surgical History  Procedure Date  . Joint replacement 07-11-11    LTHA  . Cataract extraction, bilateral 07-11-11    also Lasik surgery  . Tonsillectomy 07-11-11    child  . Cardioversion 07-11-11    x3 due to hx. A. Fib    PT Assessment/Plan/Recommendation PT Assessment  Pt reports THA 1.5 years ago Clinical Impression Statement: pt s/p direct Ant. RTHA, tolerated activity very well. pt can benefit from PT for functional mobility, ROM and strength to DC to home. pt may need a 3-in-1 PT Recommendation/Assessment: Patient will need skilled PT in the acute care venue PT Problem List: Decreased strength;Decreased range of motion;Decreased activity tolerance;Decreased knowledge of use of DME;Pain PT Therapy Diagnosis : Difficulty walking;Acute pain PT Plan PT Frequency: 7X/week PT Treatment/Interventions: DME instruction;Gait training;Stair training;Functional mobility training;Therapeutic activities;Therapeutic exercise;Patient/family education PT Recommendation Follow Up Recommendations: Home health PT;24 hour supervision/assistance Equipment Recommended: 3 in 1 bedside comode PT Goals  Acute Rehab PT Goals PT Goal Formulation: With patient/family Time For Goal Achievement: 7 days Pt will go Supine/Side to Sit: with supervision;with HOB 0 degrees PT Goal:  Supine/Side to Sit - Progress: Not met Pt will go Sit to Supine/Side: with supervision PT Goal: Sit to Supine/Side - Progress: Not met Pt will go Sit to Stand: with supervision PT Goal: Sit to Stand - Progress: Not met Pt will go Stand to Sit: with supervision PT Goal: Stand to Sit - Progress: Not met Pt will Go Up / Down Stairs: with supervision;with rolling walker PT Goal: Up/Down Stairs - Progress: Not met Pt will Perform Home Exercise Program: with supervision, verbal cues required/provided PT Goal: Perform Home Exercise Program - Progress: Not met  PT Evaluation Precautions/Restrictions  Restrictions Weight Bearing Restrictions: No Prior Functioning  Home Living Lives With: Spouse Receives Help From: Family Type of Home: House Home Layout: One level Home Access: Stairs to enter Entrance Stairs-Rails: None Entrance Stairs-Number of Steps: 1 Home Adaptive Equipment: Walker - rolling Prior Function Level of Independence: Independent with basic ADLs;Independent with transfers;Independent with gait Able to Take Stairs?: Yes Cognition Cognition Arousal/Alertness: Awake/alert Overall Cognitive Status: Appears within functional limits for tasks assessed Orientation Level: Oriented X4 Sensation/Coordination Sensation Light Touch: Appears Intact Coordination Gross Motor Movements are Fluid and Coordinated: Yes Extremity Assessment RUE Assessment RUE Assessment: Within Functional Limits LUE Assessment LUE Assessment: Within Functional Limits RLE Assessment RLE Assessment: Exceptions to [facility] RLE AROM (degrees) RLE Overall AROM Comments: tolerated about 80 degrees of hip flexion in supine RLE Strength RLE Overall Strength Comments: pt able to advance RLE without difficulty LLE Assessment LLE Assessment: Within Functional Limits Mobility (including Balance) Bed Mobility Bed Mobility: Yes Supine to Sit: 4: Min assist;HOB elevated (Comment degrees) Supine to Sit Details  (indicate cue type and reason): HOB @ 40 Sitting - Scoot to  Edge of Bed: 5: Supervision Transfers Transfers: Yes Sit to Stand: 4: Min assist;From elevated surface;With upper extremity assist;From bed Sit to Stand Details (indicate cue type and reason): vc to push with UE from bed Stand to Sit: 4: Min assist;To chair/3-in-1;With upper extremity assist Stand to Sit Details: vc to place RLE Ambulation/Gait Ambulation/Gait: Yes Ambulation/Gait Assistance: 4: Min assist Ambulation/Gait Assistance Details (indicate cue type and reason): vc to roll, RW, vc for sequence Ambulation Distance (Feet): 200 Feet Assistive device: Rolling walker Gait Pattern: Step-through pattern    Exercise  Total Joint Exercises Ankle Circles/Pumps: AAROM;Right Heel Slides: Right;AAROM;10 reps;Supine (used sheet to assist) End of Session PT - End of Session Equipment Utilized During Treatment: Gait belt Activity Tolerance: Patient tolerated treatment well Patient left: in chair;with call bell in reach;with family/visitor present Nurse Communication: Mobility status for transfers General Behavior During Session: [facility] for tasks performed Cognition: [facility] for tasks performed  [name] 07/26/2011, 9:26 AM

## 2011-07-27 LAB — CBC
HCT: 33 % — ABNORMAL LOW (ref 39.0–52.0)
Hemoglobin: 11.3 g/dL — ABNORMAL LOW (ref 13.0–17.0)
MCHC: 34.2 g/dL (ref 30.0–36.0)

## 2011-07-27 NOTE — Progress Notes (Signed)
Occupational Therapy Evaluation Patient Details Name: Damon [name] MRN: [id number] DOB: 1946-03-26 Today's Date: 07/27/2011  Problem List:  Patient Active Problem List  Diagnoses  . Degenerative arthritis of hip    Past Medical History:  Past Medical History  Diagnosis Date  . Hypertension   . Dysrhythmia 07-11-11    Dx. Paroxysmal A. Fib.-Cardioverted x3  . Sleep apnea 07-11-11    Cpap used nightly most times  . Colon polyps 07-11-11    past hx. x 2 (benign)  . Arthritis 07-11-11    Osteoarthritis-hips, s/p LTHA, now rt. planned  . Hearing loss 07-11-11    Bilaterally   Past Surgical History:  Past Surgical History  Procedure Date  . Joint replacement 07-11-11    LTHA  . Cataract extraction, bilateral 07-11-11    also Lasik surgery  . Tonsillectomy 07-11-11    child  . Cardioversion 07-11-11    x3 due to hx. A. Fib    OT Assessment/Plan/Recommendation All education regarding AE and DME in addition ot ADL with ant THR was completed with pt and wife. Pt has all necessary  DME. Wife will help as needed with ADL. No further OT needed. OT Goals Acute Rehab OT Goals OT Goal Formulation:  (eval only) Equipment recommendation: NONE OT Evaluation Precautions/Restrictions  Precautions Precautions: Anterior Hip Restrictions Weight Bearing Restrictions: No Prior Functioning Home Living Lives With: Spouse Receives Help From: Family Type of Home: House Home Layout: One level Home Access: Stairs to enter Entrance Stairs-Rails: None Entrance Stairs-Number of Steps: 1 Bathroom Shower/Tub: Psychologist, counselling;Door [employer] Toilet: Standard Bathroom Accessibility: Yes How Accessible: Accessible via walker Home Adaptive Equipment: Walker - rolling;Built-in shower seat Prior Function Level of Independence: Independent with basic ADLs;Independent with transfers;Independent with gait Able to Take Stairs?: Yes Driving: Yes ADL ADL Eating/Feeding: Independent Grooming:  Performed;Wash/dry hands;Wash/dry face Where Assessed - Grooming: Standing at sink Upper Body Bathing: Simulated;Chest;Right arm;Left arm;Abdomen;Independent Lower Body Bathing: Minimal assistance Where Assessed - Lower Body Bathing: Sit to stand from chair Lower Body Dressing: Minimal assistance Toilet Transfer: Modified independent Toilet Transfer Method: Ambulating Toilet Transfer Equipment: Regular height toilet Toileting - Clothing Manipulation: Independent Toileting - Hygiene: Independent Tub/Shower Transfer: Performed;Modified independent Tub/Shower Transfer Method: Science writer: Walk in Scientist, research (physical sciences) Used: Rolling walker ADL Comments: Pt at [employer] A to Mod I level with ADL. Pt has all nec DME. Pt is aware of availability of AE. Wife will help as  needed with ADL.  Vision/Perception  Vision - History Baseline Vision: No visual deficits Cognition Cognition Arousal/Alertness: Awake/alert Overall Cognitive Status: Appears within functional limits for tasks assessed Orientation Level: Oriented X4 Sensation/Coordination Sensation Light Touch: Appears Intact Coordination Gross Motor Movements are Fluid and Coordinated: Yes Extremity Assessment RUE Assessment RUE Assessment: Within Functional Limits LUE Assessment LUE Assessment: Within Functional Limits Mobility  Bed Mobility Bed Mobility: no (pt in chair) Transfers Sit to Stand: From elevated surface;With upper extremity assist;From chair - Mod I Stand to Sit: Mod I  End of Session OT - End of Session Equipment Utilized During Treatment: Gait belt Activity Tolerance: Patient tolerated treatment well Patient left: in chair;with call bell in reach;with family/visitor present General Behavior During Session: [facility] for tasks performed Cognition: [facility] for tasks performed   [facility] 07/27/2011, 5:27 PM

## 2011-07-27 NOTE — Progress Notes (Signed)
Patient ID: Damon [name], male   DOB: 1946-03-14, 66 y.o.   MRN: [id number] No acute changes. Comfortable AF/VSS Exam of right hip stable  Continue therapy D/C tomorrow

## 2011-07-27 NOTE — Progress Notes (Signed)
Physical Therapy Treatment Patient Details Name: Damon [name] MRN: [id number] DOB: [birth year] Today's Date: 07/27/2011 1138-1200 g PT Assessment/Plan  PT - Assessment/Plan Comments on Treatment Session: pt tolerated very well, pt states he asked Dr. Magnus Ivan about pain in hip and said not to be concerned of hip pain, states that it is less today PT Plan: Discharge plan remains appropriate;Frequency remains appropriate PT Frequency: 7X/week Follow Up Recommendations: Home health PT;24 hour supervision/assistance Equipment Recommended: 3 in 1 bedside comode PT Goals  Acute Rehab PT Goals PT Goal Formulation: With patient/family Time For Goal Achievement: 7 days Pt will go Supine/Side to Sit: with supervision;with HOB 0 degrees PT Goal: Supine/Side to Sit - Progress: Progressing toward goal Pt will go Sit to Supine/Side: with supervision PT Goal: Sit to Supine/Side - Progress: Progressing toward goal Pt will go Sit to Stand: with supervision PT Goal: Sit to Stand - Progress: Progressing toward goal Pt will go Stand to Sit: with supervision PT Goal: Stand to Sit - Progress: Progressing toward goal Pt will Perform Home Exercise Program: with supervision, verbal cues required/provided PT Goal: Perform Home Exercise Program - Progress: Progressing toward goal  PT Treatment Precautions/Restrictions  Restrictions Weight Bearing Restrictions: No Mobility (including Balance) Bed Mobility Bed Mobility: Yes Supine to Sit: 4: Min assist;HOB elevated (Comment degrees) Supine to Sit Details (indicate cue type and reason): vc to try not to use rail, HOB @50 , encouraged to have bed flat. Sitting - Scoot to Edge of Bed: 5: Supervision Sit to Supine - Right: 4: Min assist Transfers Transfers: Yes Sit to Stand: From elevated surface;With upper extremity assist;From bed;5: Supervision Sit to Stand Details (indicate cue type and reason): vc for technique Stand to Sit: 4: Min assist;To  chair/3-in-1;With upper extremity assist Stand to Sit Details: vc to place RLE to lower self more easily Ambulation/Gait Ambulation/Gait: Yes Ambulation/Gait Assistance: 5: Supervision Ambulation Distance (Feet): 200 Feet Assistive device: Rolling walker Gait Pattern: Step-through pattern    Exercise   End of Session PT - End of Session Activity Tolerance: Patient tolerated treatment well Patient left: in chair;with call bell in reach;with family/visitor present Nurse Communication: Mobility status for transfers General Behavior During Session: [facility] for tasks performed Cognition: [facility] for tasks performed  [name] 07/27/2011, 1:28 PM

## 2011-07-27 NOTE — Progress Notes (Signed)
Pt refused to wear CPAP for the night.  Pt encouraged to notify RN/ RT of any concerns.  

## 2011-07-27 NOTE — Progress Notes (Signed)
Physical Therapy Treatment Patient Details Name: Damon [name] MRN: [id number] DOB: 1945/08/12 Today's Date: 07/27/2011 1545-1615 1TE,1GT  PT Assessment/Plan  PT - Assessment/Plan Comments on Treatment Session: Patient tolerating increased weight throughout session this pm.  Did not have PCA for awhile this pm and RN restarted.  Pain improved after PCA restarted.   PT Plan: Discharge plan remains appropriate PT Frequency: 7X/week Follow Up Recommendations: Home health PT;24 hour supervision/assistance Equipment Recommended: 3 in 1 bedside comode PT Goals  Acute Rehab PT Goals PT Goal Formulation: With patient/family Time For Goal Achievement: 7 days Pt will go Supine/Side to Sit: with supervision;with HOB 0 degrees PT Goal: Supine/Side to Sit - Progress: Progressing toward goal Pt will go Sit to Supine/Side: with supervision PT Goal: Sit to Supine/Side - Progress: Progressing toward goal Pt will go Sit to Stand: with supervision PT Goal: Sit to Stand - Progress: Met Pt will go Stand to Sit: with supervision PT Goal: Stand to Sit - Progress: Progressing toward goal Pt will Ambulate: >150 feet;with modified independence;with rolling walker PT Goal: Ambulate - Progress: Progressing toward goal Pt will Perform Home Exercise Program: with supervision, verbal cues required/provided PT Goal: Perform Home Exercise Program - Progress: Progressing toward goal  PT Treatment Precautions/Restrictions  Restrictions Weight Bearing Restrictions: No Mobility (including Balance) Bed Mobility Bed Mobility: No (pt in chair) Transfers Transfers: Yes Sit to Stand: 5: Supervision;From chair/3-in-1;With upper extremity assist;With armrests Sit to Stand Details (indicate cue type and reason): vc for technique Stand to Sit: 4: Min assist;To chair/3-in-1;With upper extremity assist;With armrests Stand to Sit Details: minguard assist Ambulation/Gait Ambulation/Gait: Yes Ambulation/Gait  Assistance: 5: Supervision Ambulation/Gait Assistance Details (indicate cue type and reason): for safety.  Pt using multiple sequences to test increased weight bearing right LE Ambulation Distance (Feet): 400 Feet Assistive device: Rolling walker Gait Pattern: Step-through pattern;Step-to pattern;Decreased step length - left (improved step through pattern with practice)    Exercise  Total Joint Exercises Ankle Circles/Pumps: AROM;Both;15 reps;Seated Quad Sets: AROM;Right;10 reps;Seated Short Arc Quad: AROM;10 reps;Right;Seated Heel Slides: AAROM;Right;10 reps;Seated Hip ABduction/ADduction: AROM;Right;10 reps;Seated;AAROM End of Session PT - End of Session Equipment Utilized During Treatment: Gait belt Activity Tolerance: Patient tolerated treatment well Patient left: in chair;with family/visitor present;with call bell in reach Nurse Communication: Mobility status for transfers General Behavior During Session: [facility] for tasks performed Cognition: [facility] for tasks performed  [facility] 07/27/2011, 4:30 PM

## 2011-07-28 LAB — CBC
MCH: 30.2 pg (ref 26.0–34.0)
MCV: 87.3 fL (ref 78.0–100.0)
Platelets: 159 10*3/uL (ref 150–400)
RBC: 3.61 MIL/uL — ABNORMAL LOW (ref 4.22–5.81)

## 2011-07-28 MED ORDER — RIVAROXABAN 10 MG PO TABS: 10.0000 mg | ORAL_TABLET | Freq: Every day | ORAL | Status: DC

## 2011-07-28 MED ORDER — OXYCODONE-ACETAMINOPHEN 5-325 MG PO TABS: 1.0000 | ORAL_TABLET | ORAL | Status: AC | PRN

## 2011-07-28 MED ORDER — RIVAROXABAN 20 MG PO TABS: 1.0000 | ORAL_TABLET | Freq: Every evening | ORAL | Status: DC

## 2011-07-28 MED ORDER — METHOCARBAMOL 500 MG PO TABS: 500.0000 mg | ORAL_TABLET | Freq: Four times a day (QID) | ORAL | Status: AC | PRN

## 2011-07-28 NOTE — Progress Notes (Signed)
07/28/2011 Colleen Can, rn bsn ccm [phone] PT ADMITTED WITH DX SEVERE OSTEOARTHRITIS RIGHT HIP; RT TOTAL HIP REPLACEMNT-ANTERIOR CM SPOKE WITH PATIENT. Plans are for patient to return to his home in [place], [place] wher hid spouse will be caregiver. He does not have DME needs. Will require HHPT. Wants agency that is in network. [facility] can provide HHP pt with start date of day after discharge.

## 2011-07-28 NOTE — Progress Notes (Signed)
Physical Therapy Treatment Patient Details Name: Damon [name] MRN: [id number] DOB: Jul 27, 1945 Today's Date: 07/28/2011 905-934eg PT Assessment/Plan  PT - Assessment/Plan Comments on Treatment Session: ptmhas achieved goals. redy fro DC PT Plan: All goals met and education completed, patient dischaged from PT services Follow Up Recommendations: Home health PT Equipment Recommended: None recommended by OT PT Goals  Acute Rehab PT Goals PT Goal Formulation: With patient/family Time For Goal Achievement: 7 days Pt will go Supine/Side to Sit: with supervision;with HOB 0 degrees PT Goal: Supine/Side to Sit - Progress: Met Pt will go Sit to Supine/Side: with supervision PT Goal: Sit to Supine/Side - Progress: Met Pt will go Sit to Stand: with supervision PT Goal: Sit to Stand - Progress: Met Pt will go Stand to Sit: with supervision PT Goal: Stand to Sit - Progress: Met Pt will Ambulate: >150 feet;with modified independence;with rolling walker PT Goal: Ambulate - Progress: Met Pt will Go Up / Down Stairs: with supervision;with rolling walker PT Goal: Up/Down Stairs - Progress: Met Pt will Perform Home Exercise Program: with supervision, verbal cues required/provided PT Goal: Perform Home Exercise Program - Progress: Met  PT Treatment Precautions/Restrictions  Precautions Precautions: Anterior Hip Restrictions Weight Bearing Restrictions: No Mobility (including Balance) Bed Mobility Bed Mobility: Yes (pt in chair) Supine to Sit: 5: Supervision Sitting - Scoot to Edge of Bed: 5: Supervision Transfers Transfers: Yes Sit to Stand: From elevated surface;With upper extremity assist;From bed;5: Supervision Stand to Sit: 4: Min assist;With upper extremity assist;5: Supervision;To bed Ambulation/Gait Ambulation/Gait: Yes Ambulation/Gait Assistance: 5: Supervision Ambulation Distance (Feet): 400 Feet Assistive device: Rolling walker Gait Pattern: Step-through pattern;Step-to  pattern;Decreased step length - left (improved step through pattern with practice) Stairs: Yes Stairs Assistance: 4: Min assist;5: Supervision Stairs Assistance Details (indicate cue type and reason): vc for technique Stair Management Technique: Backwards;No rails;With walker Number of Stairs: 1  Height of Stairs: 6     Exercise  Total Joint Exercises Quad Sets: AROM;Right;10 reps;Supine Short Arc Quad: AROM;10 reps;Right;Supine Heel Slides: AAROM;Right;10 reps;Supine Hip ABduction/ADduction: AROM;Right;10 reps;AAROM;Supine End of Session PT - End of Session Activity Tolerance: Patient tolerated treatment well Patient left: in bed;with call bell in reach;with family/visitor present Nurse Communication: Mobility status for transfers General Behavior During Session: [facility] for tasks performed Cognition: [facility] for tasks performed  [name] 07/28/2011, 4:22 PM

## 2011-07-28 NOTE — Discharge Summary (Signed)
Physician Discharge Summary  Patient ID: DIXIE JAFRI MRN: [id number] DOB/AGE: Nov 14, 1945 66 y.o.  Admit date: 07/25/2011 Discharge date: 07/28/2011  Admission Diagnoses:  Degenerative arthritis of hip  Discharge Diagnoses:  Principal Problem:  *Degenerative arthritis of hip   Past Medical History  Diagnosis Date  . Hypertension   . Dysrhythmia 07-11-11    Dx. Paroxysmal A. Fib.-Cardioverted x3  . Sleep apnea 07-11-11    Cpap used nightly most times  . Colon polyps 07-11-11    past hx. x 2 (benign)  . Arthritis 07-11-11    Osteoarthritis-hips, s/p LTHA, now rt. planned  . Hearing loss 07-11-11    Bilaterally    Surgeries: Procedure(s): TOTAL HIP ARTHROPLASTY ANTERIOR APPROACH on 07/25/2011   Consultants (if any):    Discharged Condition: Improved  Hospital Course: ROCHESTER SERPE is an 66 y.o. male who was admitted 07/25/2011 with a diagnosis of Degenerative arthritis of hip and went to the operating room on 07/25/2011 and underwent the above named procedures.    He was given perioperative antibiotics:  Anti-infectives     Start     Dose/Rate Route Frequency Ordered Stop   07/25/11 1500   ceFAZolin (ANCEF) IVPB 1 g/50 mL premix        1 g 100 mL/hr over 30 Minutes Intravenous Every 6 hours 07/25/11 1400 07/26/11 0309   07/25/11 0815   ceFAZolin (ANCEF) IVPB 2 g/50 mL premix        2 g 100 mL/hr over 30 Minutes Intravenous 60 min pre-op 07/25/11 0805 07/25/11 1013        .  He was given sequential compression devices, early ambulation, and chemoprophylaxis for DVT prophylaxis.  He benefited maximally from their hospital stay and there were no complications.    Recent vital signs:  Filed Vitals:   07/28/11 0540  BP: 120/69  Pulse: 73  Temp: 98.2 F (36.8 C)  Resp: 17    Recent laboratory studies:  Lab Results  Component Value Date   HGB 10.9* 07/28/2011   HGB 11.3* 07/27/2011   HGB 12.6* 07/26/2011   Lab Results  Component Value Date   WBC 10.9*  07/28/2011   PLT 159 07/28/2011   Lab Results  Component Value Date   INR 1.19 07/11/2011   Lab Results  Component Value Date   NA 137 07/26/2011   K 3.9 07/26/2011   CL 104 07/26/2011   CO2 22 07/26/2011   BUN 13 07/26/2011   CREATININE 0.80 07/26/2011   GLUCOSE 142* 07/26/2011    Discharge Medications:   Current Discharge Medication List    START taking these medications   Details  methocarbamol (ROBAXIN) 500 MG tablet Take 1 tablet (500 mg total) by mouth every 6 (six) hours as needed. Qty: 60 tablet, Refills: 0    oxyCODONE-acetaminophen (ROXICET) 5-325 MG per tablet Take 1 tablet by mouth every 4 (four) hours as needed for pain. Qty: 60 tablet, Refills: 0      CONTINUE these medications which have CHANGED   Details  !! rivaroxaban (XARELTO) 10 MG TABS tablet Take 1 tablet (10 mg total) by mouth daily with breakfast. Qty: 20 tablet, Refills: 0    !! Rivaroxaban (XARELTO) 20 MG TABS Take 1 tablet by mouth every evening. 07-11-11 Pt. Advised to notify MD -he remains taking this med. Qty: 20 tablet, Refills: 0     !! - Potential duplicate medications found. Please discuss with provider.    CONTINUE these medications which have NOT CHANGED  Details  Ascorbic Acid (VITAMIN C CR) 1500 MG TBCR Take 1 tablet by mouth daily.      bisoprolol-hydrochlorothiazide (ZIAC) 10-6.25 MG per tablet Take 0.5 tablets by mouth every evening.     Cholecalciferol (VITAMIN D-3) 5000 UNITS TABS Take 1 tablet by mouth daily.      diltiazem (DILACOR XR) 120 MG 24 hr capsule Take 120 mg by mouth every evening.     fish oil-omega-3 fatty acids 1000 MG capsule Take 1 g by mouth daily.     GLUCOSAMINE HCL PO Take 1 tablet by mouth daily.     MAGNESIUM PO Take 1 tablet by mouth daily.     rosuvastatin (CRESTOR) 20 MG tablet Take 20 mg by mouth every evening.     Multiple Vitamin (MULITIVITAMIN WITH MINERALS) TABS Take 1 tablet by mouth daily. FOR MEN        Diagnostic Studies: Dg Hip Complete  Right  23-Aug-2011  *RADIOLOGY REPORT*  Clinical Data: Intraoperative assessment, right hip replacement.  RIGHT HIP - COMPLETE 2+ VIEW  Comparison: None.  Findings: The first fluoroscopic spot image is centered on the lower pelvis.  We visualize the medial aspect of the proximal portions of the bilateral hip prosthesis.  The acetabular cup orientation appears normal, and the pubic rami appear grossly unremarkable.  The second image depicts the right hip from the frontal projection. A right hip prosthesis is in place without fracture, dislocation, or complicating feature.  IMPRESSION: 1.  Fluoroscopic spot images depict a right hip prosthesis without complicating feature observed.  Original Report Authenticated By: Dellia Cloud, M.D.   Dg Pelvis Portable  23-Aug-2011  *RADIOLOGY REPORT*  Clinical Data: Postoperative for right hip prosthesis placement.  PORTABLE PELVIS  Comparison: 02/08/2010  Findings: A right hip prosthesis is in place, without fracture, dislocation, or other complicating feature.  Alignment and positioning appear normal.  Skin clips are noted laterally; some of the soft tissues of the right upper thigh region are excluded laterally.  Arterial atherosclerosis noted.  Bands of heterotopic ossification are present between the left greater trochanter in the lateral rim of the left acetabulum.  IMPRESSION:  1.  Interval right hip prosthesis placement, without complicating feature.  Original Report Authenticated By: Dellia Cloud, M.D.   Dg Hip Portable 1 View Right  08-23-11  *RADIOLOGY REPORT*  Clinical Data: Postoperative radiograph after right hip replacement.  PORTABLE RIGHT HIP - 1 VIEW  Comparison: 08-23-11.  Findings: Lateral view the right hip demonstrates location of the new right total hip arthroplasty.  Soft tissue overlap is present along the proximal femur, degrading the study.  Expected postoperative changes in the soft tissues.  IMPRESSION: No apparent complication of  right total hip arthroplasty on lateral view.  Original Report Authenticated By: Andreas Newport, M.D.   Dg C-arm 61-120 Min-no Report  08-23-11  CLINICAL DATA: anterior hip   C-ARM 61-120 MINUTES  Fluoroscopy was utilized by the requesting physician.  No radiographic  interpretation.      Disposition: to home       Signed: Kathryne Hitch 07/28/2011, 6:54 AM

## 2011-07-29 ENCOUNTER — Encounter (HOSPITAL_COMMUNITY): Payer: Self-pay | Admitting: Orthopaedic Surgery

## 2011-08-26 IMAGING — CR DG PORTABLE PELVIS
1 series · 1 of 1 positions shown · non-contrast
Comparison: None.

CLINICAL DATA: Status post left hip replacement.

PORTABLE PELVIS,PORTABLE LEFT HIP - 1 VIEW
,

[view not recorded]
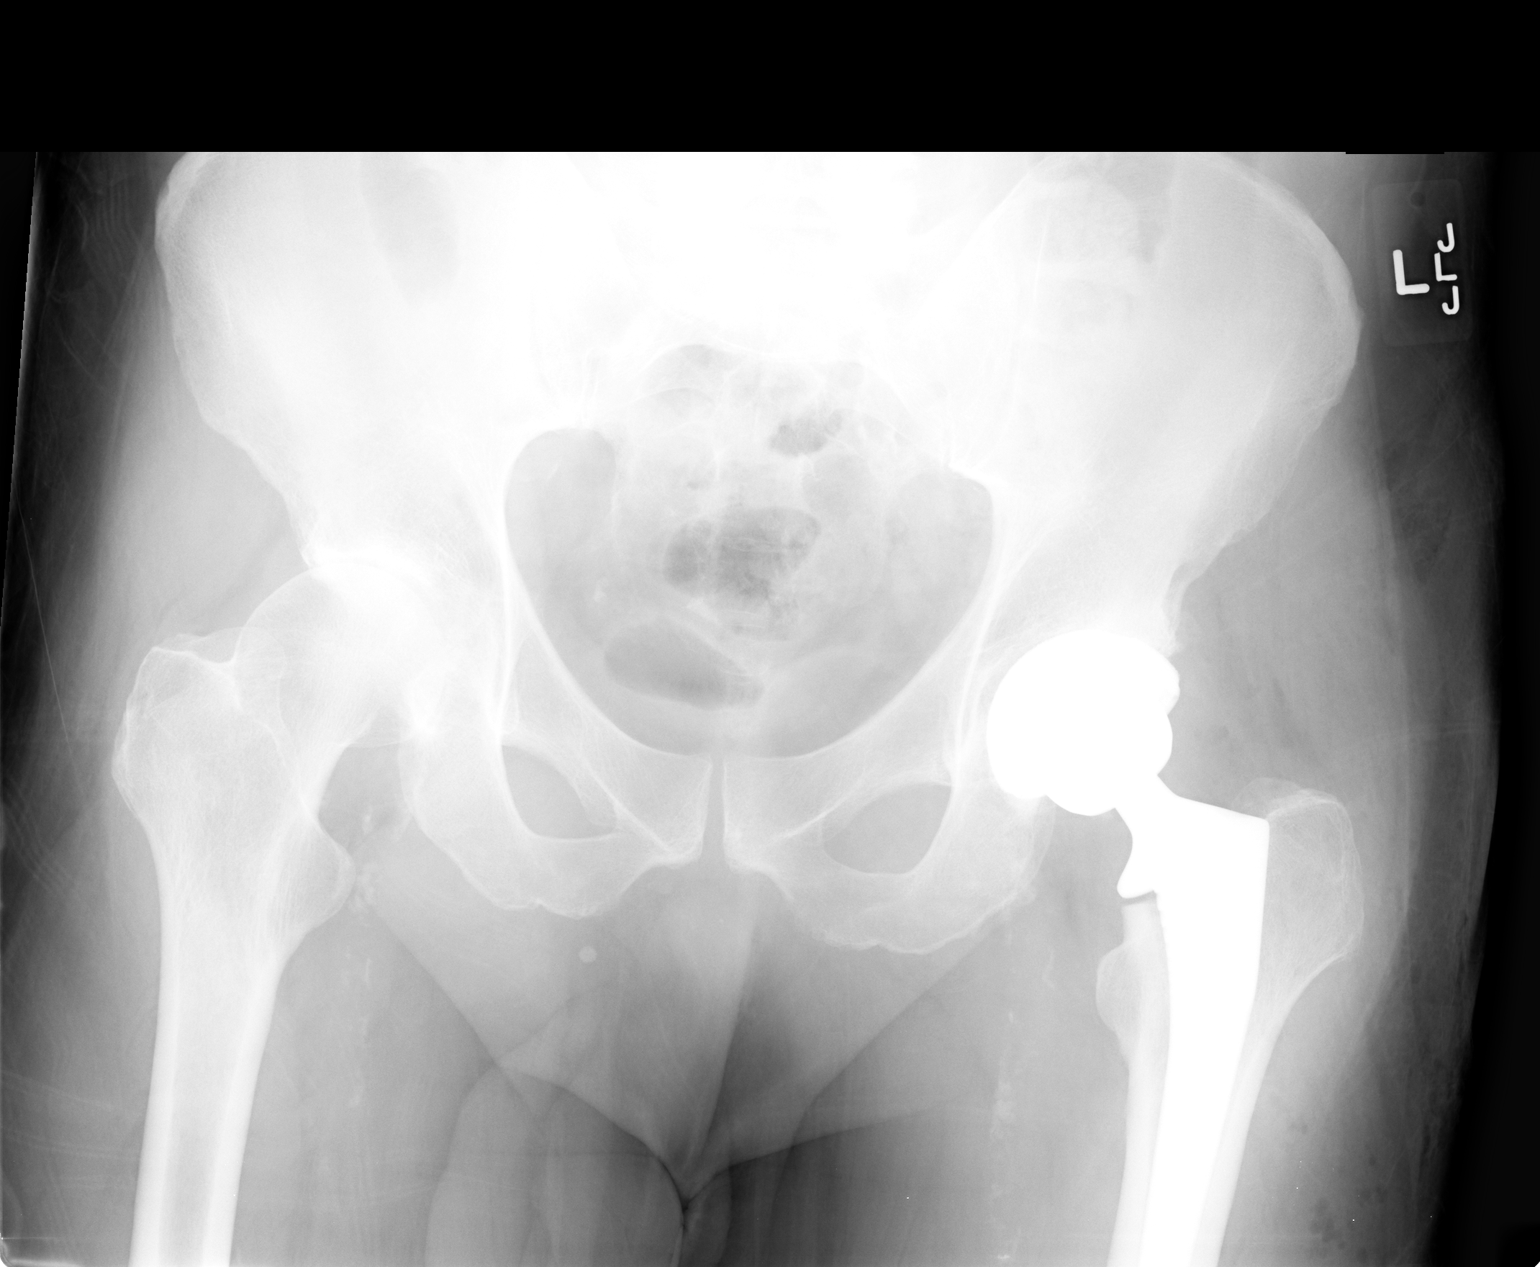

[1 of 1 positions shown; findings below may reference images not displayed]

FINDINGS: Status post total left hip replacement.  This appears in
satisfactory position.  The tip of the femoral component is not
imaged on the AP view and over penetrated lateral view.  No gross
abnormality.  Recommend follow-up for further delineation.

Right hip joint degenerative changes.  Vascular calcifications.
IMPRESSION: Grossly satisfactory position of the left hip prosthesis.  This can
be evaluated on follow-up.

This is a call report.

## 2011-08-26 IMAGING — CR DG HIP 1V PORT*L*
1 series · 1 of 1 positions shown · non-contrast
Comparison: None.

CLINICAL DATA: Status post left hip replacement.

PORTABLE PELVIS,PORTABLE LEFT HIP - 1 VIEW
,

[view not recorded]
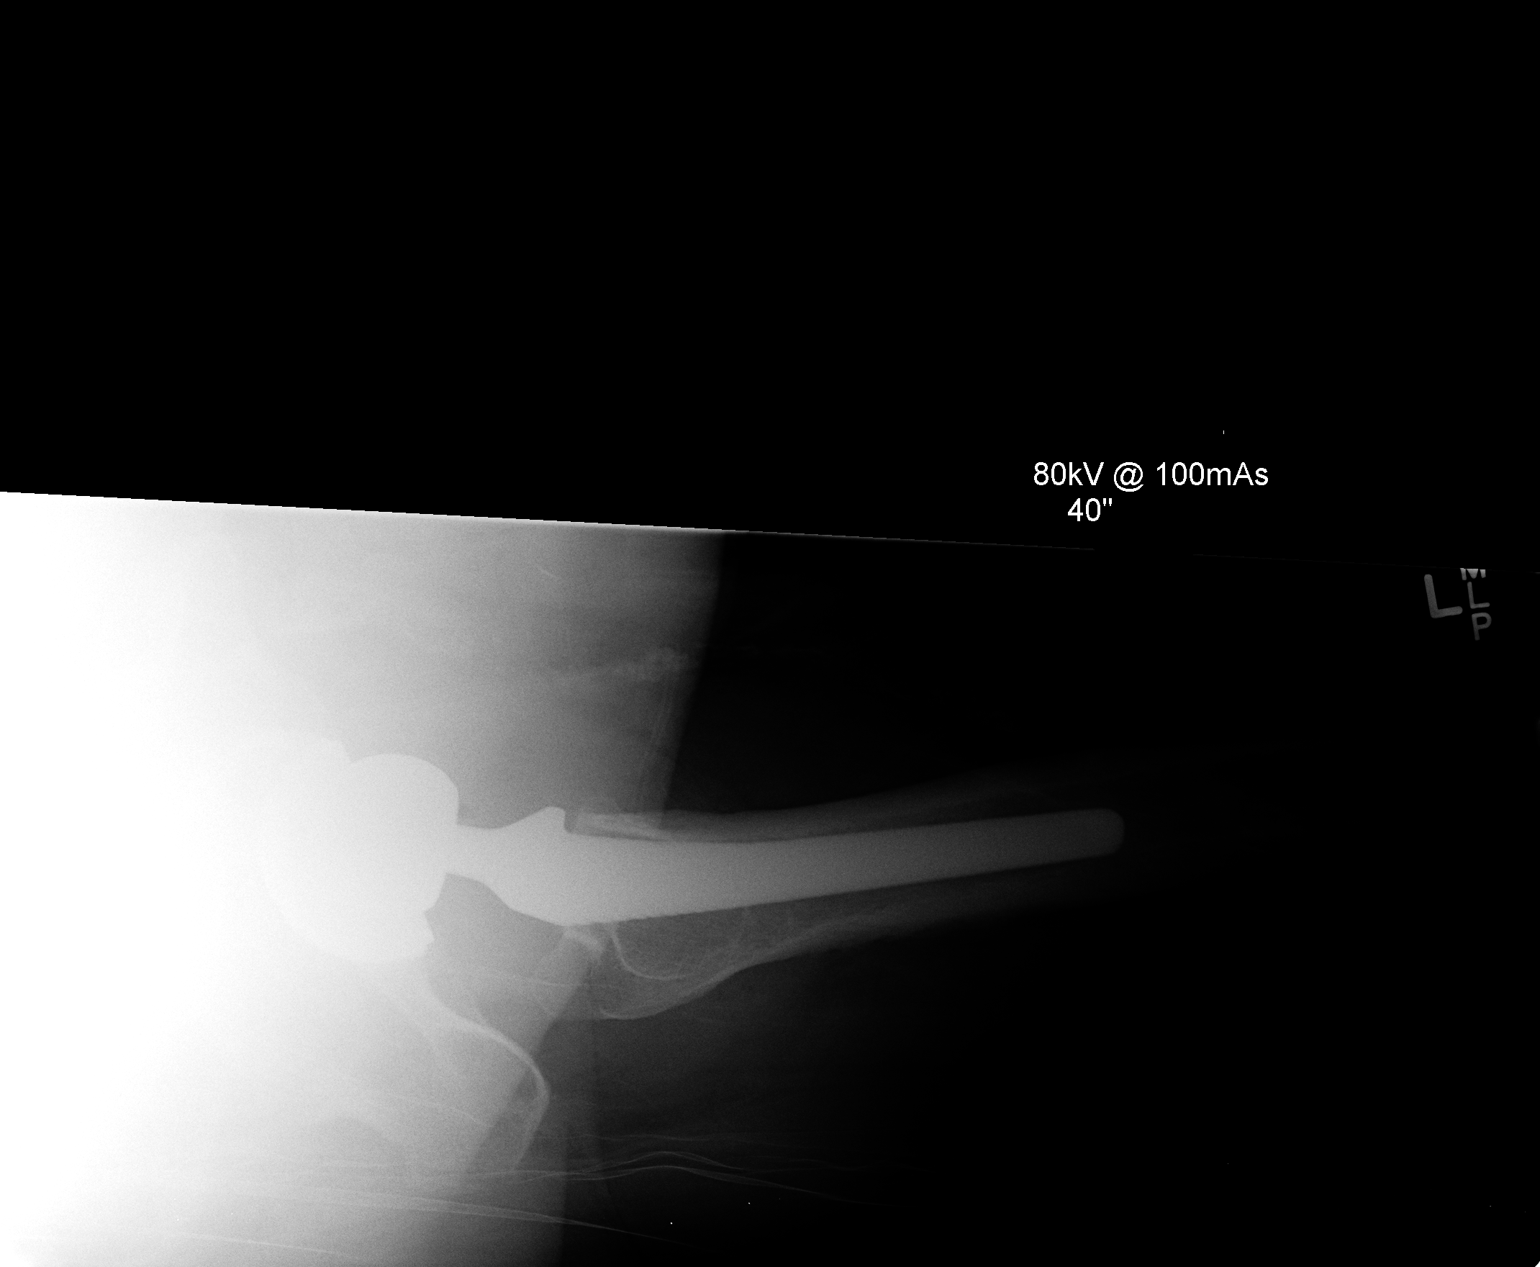

[1 of 1 positions shown; findings below may reference images not displayed]

FINDINGS: Status post total left hip replacement.  This appears in
satisfactory position.  The tip of the femoral component is not
imaged on the AP view and over penetrated lateral view.  No gross
abnormality.  Recommend follow-up for further delineation.

Right hip joint degenerative changes.  Vascular calcifications.
IMPRESSION: Grossly satisfactory position of the left hip prosthesis.  This can
be evaluated on follow-up.

This is a call report.

## 2012-03-15 ENCOUNTER — Ambulatory Visit: Payer: Self-pay | Admitting: Internal Medicine

## 2012-04-13 ENCOUNTER — Encounter: Payer: Self-pay | Admitting: *Deleted

## 2012-04-20 ENCOUNTER — Ambulatory Visit (INDEPENDENT_AMBULATORY_CARE_PROVIDER_SITE_OTHER): Payer: Medicare Other | Admitting: Internal Medicine

## 2012-04-20 ENCOUNTER — Encounter: Payer: Self-pay | Admitting: Internal Medicine

## 2012-04-20 VITALS — BP 124/82 | HR 90 | Ht 69.5 in | Wt 210.2 lb

## 2012-04-20 DIAGNOSIS — I4891 Unspecified atrial fibrillation: Secondary | ICD-10-CM

## 2012-04-20 DIAGNOSIS — G4730 Sleep apnea, unspecified: Secondary | ICD-10-CM

## 2012-04-20 DIAGNOSIS — I340 Nonrheumatic mitral (valve) insufficiency: Secondary | ICD-10-CM

## 2012-04-20 DIAGNOSIS — I10 Essential (primary) hypertension: Secondary | ICD-10-CM | POA: Insufficient documentation

## 2012-04-20 DIAGNOSIS — I059 Rheumatic mitral valve disease, unspecified: Secondary | ICD-10-CM

## 2012-04-20 MED ORDER — AMIODARONE HCL 400 MG PO TABS: ORAL_TABLET | ORAL | Status: DC

## 2012-04-20 MED ORDER — RIVAROXABAN 20 MG PO TABS: 20.0000 mg | ORAL_TABLET | Freq: Every evening | ORAL | Status: DC

## 2012-04-20 NOTE — Patient Instructions (Signed)
Your physician has recommended you make the following change in your medication:  1) Stop bisoprolol. 2) In 1 week decrease diltiazem to 120 mg once daily. 3) Start amiodarone 400 mg one tablet by mouth twice daily x 10 days, then decrease to one tablet by mouth daily. 4) Resume Xarelto 20 mg once daily.  You are being referred to : Dr. Hillis Range in our [place] office to discuss an atrial fib ablation- in the next 6-8 weeks.  Dr. Graciela Husbands will be talking with Dr. Gwen Pounds about setting you up for a TEE/ Cardioversion to be done.

## 2012-04-20 NOTE — Assessment & Plan Note (Signed)
The patient has symptomatic atrial fibrillation and has failed two antiarrhythmic drugs, propafenone and dronadarone.  Given his symptoms, he would like to pursue rhythm control is therapy with a target towards catheter ablation. In that day we discussed dofetilide and amiodarone as Bridges to therapy and he would prefer the latter as it can be initiated as an outpatient.  To that end we will begin him on amiodarone today. We will have him stop his Bystolic and decrease his diltiazem. I will be in touch with Dr. Gwen Pounds and ask him to undertake cardioversion and 10-14 days. We will also need to assess the question as to the evaluation of his mitral regurgitation and whether that is best done in sinus rhythm or atrial fibrillation. A TEE would help I think clarify the degree of his mitral regurgitation and whether it is primary or secondary.

## 2012-04-20 NOTE — Assessment & Plan Note (Signed)
As above He discuss this with Dr. Knute Neu, his father which I think makes good sense would be to reevaluate his mitral regurgitation following restoration and sustaining a sinus rhythm prior to the decision to undertake a TEE

## 2012-04-20 NOTE — Progress Notes (Signed)
ELECTROPHYSIOLOGY CONSULT NOTE  Patient ID: JULY NICKSON, MRN: [id number], DOB/AGE: 1945/11/30 66 y.o. Admit date: (Not on file) Date of Consult: 04/20/2012  Primary Physician: Nadean Corwin, MD Primary Cardiologist: BK  Chief Complaint: Afib   HPI JACQUES FIFE is a 66 y.o. male seen at the request of Dr. Gwen Pounds because of atrial fibrillation.  This initially presented about 7 years ago following an alcohol binge. He was found at that time to have apparently near-normal left ventricular function and some degree of mitral regurgitation and has been treated over the years with propafenone and previously with   Dronaderone. The latter is ineffective.  He is currently on the former and this was submitted for cardioversion a month or so ago and has reverted back to atrial fibrillation.  The atrial fibrillation and is associated with palpitations and some degree of exercise intolerance which he noted most retrospectively  following cardioversion He is aware of primarily by taking his pulse in his neck. He is sometimes unaware of it in his chest.  Echocardiogram demonstrated normal left ventricular function, a left atrial dimension 4.8 and moderate mitral regurgitation  Thromboembolic risk factors are notable for hypertension and age so his CHADS-2 score is 1 and his CHADS-VASc score is 2.  He is on Rivaroxaban   He is no longer drinking.   Past Medical History  Diagnosis Date  . Hypertension   . Atrial fibrillation 07-11-11     Cardioverted x3  . Sleep apnea 07-11-11    Cpap used nightly most times  . Colon polyps 07-11-11    past hx. x 2 (benign)  . Arthritis 07-11-11    Osteoarthritis-hips, s/p LTHA, now rt. planned  . Hearing loss 07-11-11    Bilaterally  . Hyperlipidemia       Surgical History:  Past Surgical History  Procedure Date  . Joint replacement 07-11-11    LTHA  . Cataract extraction, bilateral 07-11-11    also Lasik surgery  . Tonsillectomy  07-11-11    child  . Cardioversion 07-11-11    x3 due to hx. A. Fib  . Total hip arthroplasty 07/25/2011    Procedure: TOTAL HIP ARTHROPLASTY ANTERIOR APPROACH;  Surgeon: Kathryne Hitch;  Location: [facility];  Service: Orthopedics;  Laterality: Right;     Home Meds: Prior to Admission medications   Medication Sig Start Date End Date Taking? Authorizing Provider  Ascorbic Acid (VITAMIN C CR) 1500 MG TBCR Take 1 tablet by mouth daily.     Yes Historical Provider, MD  bisoprolol-hydrochlorothiazide (ZIAC) 10-6.25 MG per tablet Take 0.5 tablets by mouth every evening.    Yes Historical Provider, MD  Cholecalciferol (VITAMIN D-3) 5000 UNITS TABS Take 2 tablets by mouth daily.    Yes Historical Provider, MD  diltiazem (DILACOR XR) 120 MG 24 hr capsule Take 120 mg by mouth 2 (two) times daily.    Yes Historical Provider, MD  fish oil-omega-3 fatty acids 1000 MG capsule Take 1 g by mouth as needed.    Yes Historical Provider, MD  MAGNESIUM PO Take 1 tablet by mouth every other day.    Yes Historical Provider, MD  Multiple Vitamin (MULITIVITAMIN WITH MINERALS) TABS Take 1 tablet by mouth daily. FOR MEN   Yes Historical Provider, MD  Rivaroxaban (XARELTO) 20 MG TABS Take 1 tablet by mouth every evening. 07-11-11 Pt. Advised to notify MD -he remains taking this med. 07/28/11  Yes Kathryne Hitch, MD  rosuvastatin (CRESTOR) 20 MG tablet Take  20 mg by mouth every evening.    Yes Historical Provider, MD     :  Allergies: No Known Allergies  History   Social History  . Marital Status: Married    Spouse Name: N/A    Number of Children: N/A  . Years of Education: N/A   Occupational History  . Not on file.   Social History Main Topics  . Smoking status: Former Smoker -- 2.0 packs/day for 10 years    Types: Cigarettes    Quit date: 09/20/1981  . Smokeless tobacco: Not on file  . Alcohol Use: No  . Drug Use: No  . Sexually Active: Yes   Other Topics Concern  . Not on file   Social  History Narrative  . No narrative on file     Family History  Problem Relation Age of Onset  . Heart attack Mother   . Heart failure Father      ROS:  Please see the history of present illness.     All other systems reviewed and negative.    Physical Exam:Blood pressure 124/82, pulse 90, height 5' 9.5" (1.765 m), weight 210 lb 4 oz (95.369 kg). General: Well developed, well nourished male in no acute distress. Head: Normocephalic, atraumatic, sclera non-icteric, no xanthomas, nares are without discharge. Lymph Nodes:  none Back: without scoliosis/kyphosis, no CVA tendersness Neck: Negative for carotid bruits. JVD not elevated. Lungs: Clear bilaterally to auscultation without wheezes, rales, or rhonchi. Breathing is unlabored. Heart: Irregularly irregular rhythm with S1 S2. No  systolic murmur , rubs, or gallops appreciated. Abdomen: Soft, non-tender, non-distended with normoactive bowel sounds. No hepatomegaly. No rebound/guarding. No obvious abdominal masses. Msk:  Strength and tone appear normal for age. Extremities: No clubbing or cyanosis. No edema.  Distal pedal pulses are 2+ and equal bilaterally. Skin: Warm and Dry Neuro: Alert and oriented X 3. CN III-XII intact Grossly normal sensory and motor function . Psych:  Responds to questions appropriately with a normal affect.      Labs: Cardiac Enzymes No results found for this basename: CKTOTAL:4,CKMB:4,TROPONINI:4 in the last 72 hours CBC Lab Results  Component Value Date   WBC 10.9* 07/28/2011   HGB 10.9* 07/28/2011   HCT 31.5* 07/28/2011   MCV 87.3 07/28/2011   PLT 159 07/28/2011   PROTIME: No results found for this basename: LABPROT:3,INR:3 in the last 72 hours Chemistry No results found for this basename: NA,K,CL,CO2,BUN,CREATININE,CALCIUM,LABALBU,PROT,BILITOT,ALKPHOS,ALT,AST,GLUCOSE in the last 168 hours Lipids No results found for this basename: CHOL, HDL, LDLCALC, TRIG   BNP No results found for this basename: probnp    Miscellaneous No results found for this basename: DDIMER    Radiology/Studies:  No results found.  EKG: Atrial fibrillation   Assessment and Plan: Sherryl Manges

## 2012-04-20 NOTE — Assessment & Plan Note (Signed)
With hypertension, he has a CHADS2 score of one for long-term anticoagulation would be appropriate. We discussed the relative benefits of the NOACs compared to Coumadin as well as of apixaban compared to aspirin. For now we'll keep him on Xarelto .

## 2012-04-20 NOTE — Assessment & Plan Note (Signed)
treated

## 2012-04-21 ENCOUNTER — Encounter: Payer: Self-pay | Admitting: Cardiology

## 2012-05-03 ENCOUNTER — Telehealth: Payer: Self-pay | Admitting: Internal Medicine

## 2012-05-03 NOTE — Telephone Encounter (Signed)
Pt has questions about setting up for a TEE/ Cardioversion per his instructions on his last visit.  Pt states he hasn't received any further notice.  Please call pt to advise.

## 2012-05-03 NOTE — Telephone Encounter (Signed)
Do I need to schedule pt for this?

## 2012-05-09 NOTE — Telephone Encounter (Signed)
He was supposed to hear from Dr Laurena Bering;; he no longer needs a TEE but should get cardioversion and then echo to assess MR.  I will be in touch with Dr BK today and hopefully he should hear Mon tues,  We should see him in about 5 weeks, and there should be a repeat echo just before that visit thanks

## 2012-05-10 NOTE — Telephone Encounter (Signed)
lmtcb

## 2012-05-13 NOTE — Telephone Encounter (Signed)
Pt needs appt with Dr. Graciela Husbands in 5 weeks (see below) with echo just prior to

## 2012-05-17 NOTE — Telephone Encounter (Signed)
Looking at pt appt he has an appt with [facility] on 11/25 does he still need to see Graciela Husbands??

## 2012-05-18 NOTE — Telephone Encounter (Signed)
See below

## 2012-05-18 NOTE — Telephone Encounter (Signed)
Yes, seeing him for something diff

## 2012-06-10 ENCOUNTER — Encounter: Payer: Self-pay | Admitting: Internal Medicine

## 2012-06-14 ENCOUNTER — Encounter: Payer: Self-pay | Admitting: Internal Medicine

## 2012-06-14 ENCOUNTER — Ambulatory Visit (INDEPENDENT_AMBULATORY_CARE_PROVIDER_SITE_OTHER): Payer: Medicare Other | Admitting: Internal Medicine

## 2012-06-14 VITALS — BP 123/86 | HR 86 | Ht 69.5 in | Wt 214.4 lb

## 2012-06-14 DIAGNOSIS — I340 Nonrheumatic mitral (valve) insufficiency: Secondary | ICD-10-CM

## 2012-06-14 DIAGNOSIS — G4730 Sleep apnea, unspecified: Secondary | ICD-10-CM

## 2012-06-14 DIAGNOSIS — I4891 Unspecified atrial fibrillation: Secondary | ICD-10-CM

## 2012-06-14 DIAGNOSIS — I059 Rheumatic mitral valve disease, unspecified: Secondary | ICD-10-CM

## 2012-06-14 DIAGNOSIS — I10 Essential (primary) hypertension: Secondary | ICD-10-CM

## 2012-06-15 ENCOUNTER — Ambulatory Visit: Payer: Medicare Other | Admitting: Internal Medicine

## 2012-06-24 ENCOUNTER — Other Ambulatory Visit: Payer: Self-pay | Admitting: *Deleted

## 2012-06-24 ENCOUNTER — Encounter: Payer: Self-pay | Admitting: *Deleted

## 2012-06-24 DIAGNOSIS — I4891 Unspecified atrial fibrillation: Secondary | ICD-10-CM

## 2012-06-28 ENCOUNTER — Encounter: Payer: Self-pay | Admitting: Internal Medicine

## 2012-06-28 NOTE — Assessment & Plan Note (Signed)
The patient has symptomatic persistent atrial fibrillation.  He has failed medical therapy with propafenone and multaq. Therapeutic strategies for afib including medicine and ablation were discussed in detail with the patient today. Risk, benefits, and alternatives to EP study and radiofrequency ablation for afib were also discussed in detail today. These risks include but are not limited to stroke, bleeding, vascular damage, tamponade, perforation, damage to the esophagus, lungs, and other structures, pulmonary vein stenosis, worsening renal function, and death. The patient understands these risk and wishes to proceed.  We will therefore proceed with catheter ablation at the next available time.  The importance of compliance with xarelto was stressed today. We will perform TEE prior to his ablation to further evaluate his mitral regurgitation.  If his MR has progressed, then he may require surgical intervention and MAZE rather than ablation.  This will be determined following his TEE.

## 2012-06-28 NOTE — Progress Notes (Signed)
Primary Care Physician: Nadean Corwin, MD Referring Physician:  Dr Graciela Husbands Primary Cardiologist:  Dr Lupita Dawn is a 66 y.o. male with a h/o persistent atrial fibrillation who presents today for consideration of ablation.  He reports that his afib began 7 years ago and was initially triggered by ETOH. He was found at that time to have apparently near-normal left ventricular function and some degree of mitral regurgitation.  He has failed  medical therpay with propafenone and Dronaderone.   He reports increasing frequency and duration of atrial fibrillation.  He reports symptoms of palpitations and decreased exercise tolerance in afib.  Prior echo demonstrated normal left ventricular function, a left atrial dimension 4.8 and moderate mitral regurgitation.  He quit ETOH but continues to have afib.  He also uses CPAP for OSA.  He is presently anticoagulated with xarelto.   Today, he denies symptoms of  chest pain, shortness of breath, orthopnea, PND, lower extremity edema, dizziness, presyncope, syncope, or neurologic sequela. The patient is tolerating medications without difficulties and is otherwise without complaint today.   Past Medical History  Diagnosis Date  . Hypertension   . Persistent atrial fibrillation 07-11-11     Cardioverted x3  . Sleep apnea 07-11-11    Cpap used nightly most times  . Colon polyps 07-11-11    past hx. x 2 (benign)  . Arthritis 07-11-11    Osteoarthritis-hips, s/p LTHA, now rt. planned  . Hearing loss 07-11-11    Bilaterally  . Hyperlipidemia   . Mitral regurgitation    Past Surgical History  Procedure Date  . Joint replacement 07-11-11    LTHA  . Cataract extraction, bilateral 07-11-11    also Lasik surgery  . Tonsillectomy 07-11-11    child  . Cardioversion 07-11-11    x3 due to hx. A. Fib  . Total hip arthroplasty 07/25/2011    Procedure: TOTAL HIP ARTHROPLASTY ANTERIOR APPROACH;  Surgeon: Kathryne Hitch;  Location:  [facility];  Service: Orthopedics;  Laterality: Right;    Current Outpatient Prescriptions  Medication Sig Dispense Refill  . Ascorbic Acid (VITAMIN C CR) 1500 MG TBCR Take 1 tablet by mouth daily.        . bisoprolol-hydrochlorothiazide (ZIAC) 10-6.25 MG per tablet Take 1 tablet by mouth daily.      . Cholecalciferol (VITAMIN D-3) 5000 UNITS TABS Take 2 tablets by mouth daily.       Marland Kitchen diltiazem (DILACOR XR) 120 MG 24 hr capsule Take one capsule by mouth daily      . fish oil-omega-3 fatty acids 1000 MG capsule Take 1 g by mouth as needed.       Marland Kitchen MAGNESIUM PO Take 1 tablet by mouth every other day.       . Multiple Vitamin (MULITIVITAMIN WITH MINERALS) TABS Take 1 tablet by mouth daily. FOR MEN      . Rivaroxaban (XARELTO) 20 MG TABS Take 1 tablet (20 mg total) by mouth every evening. 07-11-11 Pt. Advised to notify MD -he remains taking this med.  30 tablet  6  . rosuvastatin (CRESTOR) 20 MG tablet Take 20 mg by mouth every evening.       Marland Kitchen amiodarone (PACERONE) 400 MG tablet Take one tablet by mouth twice daily for 10 days, then decrease to one tablet by mouth once daily  60 tablet  2    No Known Allergies  History   Social History  . Marital Status: Married    Spouse  Name: N/A    Number of Children: N/A  . Years of Education: N/A   Occupational History  . Not on file.   Social History Main Topics  . Smoking status: Former Smoker -- 2.0 packs/day for 10 years    Types: Cigarettes    Quit date: 09/20/1981  . Smokeless tobacco: Not on file  . Alcohol Use: No  . Drug Use: No  . Sexually Active: Yes   Other Topics Concern  . Not on file   Social History Narrative  . No narrative on file    Family History  Problem Relation Age of Onset  . Heart attack Mother   . Heart failure Father     ROS- All systems are reviewed and negative except as per the HPI above  Physical Exam: Filed Vitals:   06/14/12 1503  BP: 123/86  Pulse: 86  Height: 5' 9.5" (1.765 m)  Weight: 214  lb 6.4 oz (97.251 kg)    GEN- The patient is well appearing, alert and oriented x 3 today.   Head- normocephalic, atraumatic Eyes-  Sclera clear, conjunctiva pink Ears- hearing intact Oropharynx- clear Neck- supple, no JVP Lymph- no cervical lymphadenopathy Lungs- Clear to ausculation bilaterally, normal work of breathing Heart-irregular rate and rhythm, no murmurs, rubs or gallops, PMI not laterally displaced GI- soft, NT, ND, + BS Extremities- no clubbing, cyanosis, or edema MS- no significant deformity or atrophy Skin- no rash or lesion Psych- euthymic mood, full affect Neuro- strength and sensation are intact  EKG today reveals afib, V rate 76 bpm  Assessment and Plan:

## 2012-06-28 NOTE — Assessment & Plan Note (Signed)
Compliance with CPAP is encouraged   

## 2012-06-28 NOTE — Assessment & Plan Note (Signed)
Stable No change required today  

## 2012-06-28 NOTE — Assessment & Plan Note (Signed)
As above.

## 2012-07-15 ENCOUNTER — Encounter (HOSPITAL_COMMUNITY): Payer: Self-pay | Admitting: Pharmacy Technician

## 2012-07-19 ENCOUNTER — Other Ambulatory Visit (INDEPENDENT_AMBULATORY_CARE_PROVIDER_SITE_OTHER): Payer: Medicare Other

## 2012-07-19 DIAGNOSIS — I4891 Unspecified atrial fibrillation: Secondary | ICD-10-CM

## 2012-07-19 LAB — BASIC METABOLIC PANEL
Calcium: 8.9 mg/dL (ref 8.4–10.5)
Creatinine, Ser: 1 mg/dL (ref 0.4–1.5)

## 2012-07-20 LAB — CBC WITH DIFFERENTIAL/PLATELET
Basophils Relative: 0.2 % (ref 0.0–3.0)
Eosinophils Absolute: 0.1 10*3/uL (ref 0.0–0.7)
Eosinophils Relative: 0.8 % (ref 0.0–5.0)
Lymphocytes Relative: 11.8 % — ABNORMAL LOW (ref 12.0–46.0)
Neutrophils Relative %: 78.7 % — ABNORMAL HIGH (ref 43.0–77.0)
Platelets: 213 10*3/uL (ref 150.0–400.0)
RBC: 5.11 Mil/uL (ref 4.22–5.81)
WBC: 12.5 10*3/uL — ABNORMAL HIGH (ref 4.5–10.5)

## 2012-07-26 ENCOUNTER — Encounter (HOSPITAL_COMMUNITY)
Admission: RE | Disposition: A | Discharge status: Home / Self Care | Payer: Self-pay | Source: Ambulatory Visit | Attending: Internal Medicine

## 2012-07-26 ENCOUNTER — Ambulatory Visit (HOSPITAL_COMMUNITY)
Admission: RE | Admit: 2012-07-26 | Discharge: 2012-07-26 | Disposition: A | Discharge status: Home / Self Care | Payer: Medicare Other | Source: Ambulatory Visit | Attending: Internal Medicine | Admitting: Internal Medicine

## 2012-07-26 DIAGNOSIS — I4891 Unspecified atrial fibrillation: Secondary | ICD-10-CM

## 2012-07-26 HISTORY — PX: TEE WITHOUT CARDIOVERSION: SHX5443

## 2012-07-26 SURGERY — ECHOCARDIOGRAM, TRANSESOPHAGEAL
Anesthesia: Moderate Sedation

## 2012-07-26 MED ORDER — MIDAZOLAM HCL 10 MG/2ML IJ SOLN
INTRAMUSCULAR | Status: DC | PRN
  Administered 2012-07-26 (×2): 2 mg via INTRAVENOUS

## 2012-07-26 MED ORDER — MIDAZOLAM HCL 5 MG/ML IJ SOLN
INTRAMUSCULAR | Status: AC
  Filled 2012-07-26: qty 3

## 2012-07-26 MED ORDER — LIDOCAINE VISCOUS 2 % MT SOLN
OROMUCOSAL | Status: DC | PRN
  Administered 2012-07-26: 15 mL via OROMUCOSAL

## 2012-07-26 MED ORDER — FENTANYL CITRATE 0.05 MG/ML IJ SOLN
INTRAMUSCULAR | Status: DC | PRN
  Administered 2012-07-26 (×2): 25 ug via INTRAVENOUS

## 2012-07-26 MED ORDER — SODIUM CHLORIDE 0.9 % IV SOLN: INTRAVENOUS | Status: DC

## 2012-07-26 MED ORDER — LIDOCAINE VISCOUS 2 % MT SOLN
OROMUCOSAL | Status: AC
  Filled 2012-07-26: qty 15

## 2012-07-26 MED ORDER — FENTANYL CITRATE 0.05 MG/ML IJ SOLN
INTRAMUSCULAR | Status: AC
  Filled 2012-07-26: qty 4

## 2012-07-26 NOTE — H&P (View-Only) (Signed)
 Primary Care Physician: MCKEOWN,WILLIAM DAVID, MD Referring Physician:  Dr Klein Primary Cardiologist:  Dr Kowalski   Damon [name] is a 66 y.o. male with a h/o persistent atrial fibrillation who presents today for consideration of ablation.  He reports that his afib began 7 years ago and was initially triggered by ETOH. He was found at that time to have apparently near-normal left ventricular function and some degree of mitral regurgitation.  He has failed  medical therpay with propafenone and Dronaderone.   He reports increasing frequency and duration of atrial fibrillation.  He reports symptoms of palpitations and decreased exercise tolerance in afib.  Prior echo demonstrated normal left ventricular function, a left atrial dimension 4.8 and moderate mitral regurgitation.  He quit ETOH but continues to have afib.  He also uses CPAP for OSA.  He is presently anticoagulated with xarelto.   Today, he denies symptoms of  chest pain, shortness of breath, orthopnea, PND, lower extremity edema, dizziness, presyncope, syncope, or neurologic sequela. The patient is tolerating medications without difficulties and is otherwise without complaint today.   Past Medical History  Diagnosis Date  . Hypertension   . Persistent atrial fibrillation 07-11-11     Cardioverted x3  . Sleep apnea 07-11-11    Cpap used nightly most times  . Colon polyps 07-11-11    past hx. x 2 (benign)  . Arthritis 07-11-11    Osteoarthritis-hips, s/p LTHA, now rt. planned  . Hearing loss 07-11-11    Bilaterally  . Hyperlipidemia   . Mitral regurgitation    Past Surgical History  Procedure Date  . Joint replacement 07-11-11    LTHA  . Cataract extraction, bilateral 07-11-11    also Lasik surgery  . Tonsillectomy 07-11-11    child  . Cardioversion 07-11-11    x3 due to hx. A. Fib  . Total hip arthroplasty 07/25/2011    Procedure: TOTAL HIP ARTHROPLASTY ANTERIOR APPROACH;  Surgeon: Christopher Y Blackman;  Location:  [facility];  Service: Orthopedics;  Laterality: Right;    Current Outpatient Prescriptions  Medication Sig Dispense Refill  . Ascorbic Acid (VITAMIN C CR) 1500 MG TBCR Take 1 tablet by mouth daily.        . bisoprolol-hydrochlorothiazide (ZIAC) 10-6.25 MG per tablet Take 1 tablet by mouth daily.      . Cholecalciferol (VITAMIN D-3) 5000 UNITS TABS Take 2 tablets by mouth daily.       . diltiazem (DILACOR XR) 120 MG 24 hr capsule Take one capsule by mouth daily      . fish oil-omega-3 fatty acids 1000 MG capsule Take 1 g by mouth as needed.       . MAGNESIUM PO Take 1 tablet by mouth every other day.       . Multiple Vitamin (MULITIVITAMIN WITH MINERALS) TABS Take 1 tablet by mouth daily. FOR MEN      . Rivaroxaban (XARELTO) 20 MG TABS Take 1 tablet (20 mg total) by mouth every evening. 07-11-11 Pt. Advised to notify MD -he remains taking this med.  30 tablet  6  . rosuvastatin (CRESTOR) 20 MG tablet Take 20 mg by mouth every evening.       . amiodarone (PACERONE) 400 MG tablet Take one tablet by mouth twice daily for 10 days, then decrease to one tablet by mouth once daily  60 tablet  2    No Known Allergies  History   Social History  . Marital Status: Married    Spouse   Name: N/A    Number of Children: N/A  . Years of Education: N/A   Occupational History  . Not on file.   Social History Main Topics  . Smoking status: Former Smoker -- 2.0 packs/day for 10 years    Types: Cigarettes    Quit date: 09/20/1981  . Smokeless tobacco: Not on file  . Alcohol Use: No  . Drug Use: No  . Sexually Active: Yes   Other Topics Concern  . Not on file   Social History Narrative  . No narrative on file    Family History  Problem Relation Age of Onset  . Heart attack Mother   . Heart failure Father     ROS- All systems are reviewed and negative except as per the HPI above  Physical Exam: Filed Vitals:   06/14/12 1503  BP: 123/86  Pulse: 86  Height: 5' 9.5" (1.765 m)  Weight: 214  lb 6.4 oz (97.251 kg)    GEN- The patient is well appearing, alert and oriented x 3 today.   Head- normocephalic, atraumatic Eyes-  Sclera clear, conjunctiva pink Ears- hearing intact Oropharynx- clear Neck- supple, no JVP Lymph- no cervical lymphadenopathy Lungs- Clear to ausculation bilaterally, normal work of breathing Heart-irregular rate and rhythm, no murmurs, rubs or gallops, PMI not laterally displaced GI- soft, NT, ND, + BS Extremities- no clubbing, cyanosis, or edema MS- no significant deformity or atrophy Skin- no rash or lesion Psych- euthymic mood, full affect Neuro- strength and sensation are intact  EKG today reveals afib, V rate 76 bpm  Assessment and Plan:  

## 2012-07-26 NOTE — Op Note (Signed)
No thrombus in LAA or LA. Full report to follow.

## 2012-07-26 NOTE — Interval H&P Note (Signed)
History and Physical Interval Note:  07/26/2012 11:14 AM  Damon [name]  has presented today for surgery, with the diagnosis of a-fib  The various methods of treatment have been discussed with the patient and family. After consideration of risks, benefits and other options for treatment, the patient has consented to  Procedure(s) (LRB) with comments: TRANSESOPHAGEAL ECHOCARDIOGRAM (TEE) (N/A) as a surgical intervention .  The patient's history has been reviewed, patient examined, no change in status, stable for surgery.  I have reviewed the patient's chart and labs.  Questions were answered to the patient's satisfaction.     [name]

## 2012-07-27 ENCOUNTER — Encounter (HOSPITAL_COMMUNITY)
Admission: RE | Disposition: A | Discharge status: Home / Self Care | Payer: Self-pay | Source: Ambulatory Visit | Attending: Internal Medicine

## 2012-07-27 ENCOUNTER — Ambulatory Visit: Payer: Medicare Other | Admitting: Internal Medicine

## 2012-07-27 ENCOUNTER — Encounter (HOSPITAL_COMMUNITY): Payer: Self-pay | Admitting: Certified Registered"

## 2012-07-27 ENCOUNTER — Ambulatory Visit (HOSPITAL_COMMUNITY): Payer: Medicare Other | Admitting: Certified Registered"

## 2012-07-27 ENCOUNTER — Encounter (HOSPITAL_COMMUNITY): Payer: Self-pay | Admitting: General Practice

## 2012-07-27 ENCOUNTER — Ambulatory Visit (HOSPITAL_COMMUNITY)
Admission: RE | Admit: 2012-07-27 | Discharge: 2012-07-28 | Disposition: A | Discharge status: Home / Self Care | Payer: Medicare Other | Source: Ambulatory Visit | Attending: Internal Medicine | Admitting: Internal Medicine

## 2012-07-27 DIAGNOSIS — I340 Nonrheumatic mitral (valve) insufficiency: Secondary | ICD-10-CM | POA: Diagnosis present

## 2012-07-27 DIAGNOSIS — I10 Essential (primary) hypertension: Secondary | ICD-10-CM | POA: Diagnosis present

## 2012-07-27 DIAGNOSIS — I4891 Unspecified atrial fibrillation: Secondary | ICD-10-CM

## 2012-07-27 DIAGNOSIS — I059 Rheumatic mitral valve disease, unspecified: Secondary | ICD-10-CM | POA: Insufficient documentation

## 2012-07-27 DIAGNOSIS — E785 Hyperlipidemia, unspecified: Secondary | ICD-10-CM | POA: Insufficient documentation

## 2012-07-27 DIAGNOSIS — Z7901 Long term (current) use of anticoagulants: Secondary | ICD-10-CM | POA: Insufficient documentation

## 2012-07-27 HISTORY — PX: ATRIAL FIBRILLATION ABLATION: SHX5456

## 2012-07-27 LAB — MRSA PCR SCREENING: MRSA by PCR: NEGATIVE

## 2012-07-27 LAB — POCT ACTIVATED CLOTTING TIME
Activated Clotting Time: 306 seconds
Activated Clotting Time: 165 seconds

## 2012-07-27 SURGERY — ATRIAL FIBRILLATION ABLATION
Anesthesia: General

## 2012-07-27 MED ORDER — HEPARIN SODIUM (PORCINE) 1000 UNIT/ML IJ SOLN
INTRAMUSCULAR | Status: DC | PRN
  Administered 2012-07-27: 10000 [IU] via INTRAVENOUS
  Administered 2012-07-27: 3000 [IU] via INTRAVENOUS
  Administered 2012-07-27: 1000 [IU] via INTRAVENOUS

## 2012-07-27 MED ORDER — ONDANSETRON HCL 4 MG/2ML IJ SOLN: 4.0000 mg | Freq: Once | INTRAMUSCULAR | Status: AC | PRN

## 2012-07-27 MED ORDER — FENTANYL CITRATE 0.05 MG/ML IJ SOLN
INTRAMUSCULAR | Status: DC | PRN
  Administered 2012-07-27: 100 ug via INTRAVENOUS
  Administered 2012-07-27 (×3): 25 ug via INTRAVENOUS

## 2012-07-27 MED ORDER — BISOPROLOL-HYDROCHLOROTHIAZIDE 10-6.25 MG PO TABS
0.5000 | ORAL_TABLET | Freq: Every day | ORAL | Status: DC
  Administered 2012-07-27 – 2012-07-28 (×2): 0.5 via ORAL
  Filled 2012-07-27 (×2): qty 1

## 2012-07-27 MED ORDER — RIVAROXABAN 20 MG PO TABS
20.0000 mg | ORAL_TABLET | Freq: Every day | ORAL | Status: DC
  Administered 2012-07-27: 20 mg via ORAL
  Filled 2012-07-27 (×2): qty 1

## 2012-07-27 MED ORDER — BUPIVACAINE HCL (PF) 0.25 % IJ SOLN
INTRAMUSCULAR | Status: AC
  Filled 2012-07-27: qty 30

## 2012-07-27 MED ORDER — ACETAMINOPHEN 10 MG/ML IV SOLN
1000.0000 mg | Freq: Once | INTRAVENOUS | Status: AC | PRN
  Filled 2012-07-27: qty 100

## 2012-07-27 MED ORDER — ACETAMINOPHEN 325 MG PO TABS: 650.0000 mg | ORAL_TABLET | ORAL | Status: DC | PRN

## 2012-07-27 MED ORDER — HYDROCODONE-ACETAMINOPHEN 5-325 MG PO TABS: 1.0000 | ORAL_TABLET | ORAL | Status: DC | PRN

## 2012-07-27 MED ORDER — PROPOFOL 10 MG/ML IV BOLUS
INTRAVENOUS | Status: DC | PRN
  Administered 2012-07-27: 10 mg via INTRAVENOUS
  Administered 2012-07-27: 160 mg via INTRAVENOUS
  Administered 2012-07-27: 30 mg via INTRAVENOUS

## 2012-07-27 MED ORDER — SODIUM CHLORIDE 0.9 % IJ SOLN: 3.0000 mL | INTRAMUSCULAR | Status: DC | PRN

## 2012-07-27 MED ORDER — ONDANSETRON HCL 4 MG/2ML IJ SOLN
INTRAMUSCULAR | Status: DC | PRN
  Administered 2012-07-27: 4 mg via INTRAVENOUS

## 2012-07-27 MED ORDER — MIDAZOLAM HCL 5 MG/5ML IJ SOLN
INTRAMUSCULAR | Status: DC | PRN
  Administered 2012-07-27: 2 mg via INTRAVENOUS

## 2012-07-27 MED ORDER — ARTIFICIAL TEARS OP OINT
TOPICAL_OINTMENT | OPHTHALMIC | Status: DC | PRN
  Administered 2012-07-27: 1 via OPHTHALMIC

## 2012-07-27 MED ORDER — DILTIAZEM HCL ER 120 MG PO CP24
120.0000 mg | ORAL_CAPSULE | Freq: Two times a day (BID) | ORAL | Status: DC
  Filled 2012-07-27: qty 1

## 2012-07-27 MED ORDER — SODIUM CHLORIDE 0.9 % IV SOLN
INTRAVENOUS | Status: DC | PRN
  Administered 2012-07-27 (×2): via INTRAVENOUS

## 2012-07-27 MED ORDER — SODIUM CHLORIDE 0.9 % IJ SOLN
3.0000 mL | Freq: Two times a day (BID) | INTRAMUSCULAR | Status: DC
  Administered 2012-07-27: 3 mL via INTRAVENOUS

## 2012-07-27 MED ORDER — DILTIAZEM HCL ER COATED BEADS 120 MG PO CP24
120.0000 mg | ORAL_CAPSULE | Freq: Two times a day (BID) | ORAL | Status: DC
  Administered 2012-07-27 – 2012-07-28 (×2): 120 mg via ORAL
  Filled 2012-07-27 (×3): qty 1

## 2012-07-27 MED ORDER — PROTAMINE SULFATE 10 MG/ML IV SOLN
INTRAVENOUS | Status: DC | PRN
  Administered 2012-07-27 (×4): 10 mg via INTRAVENOUS

## 2012-07-27 MED ORDER — HYDROMORPHONE HCL PF 1 MG/ML IJ SOLN: 0.2500 mg | INTRAMUSCULAR | Status: DC | PRN

## 2012-07-27 MED ORDER — ONDANSETRON HCL 4 MG/2ML IJ SOLN: 4.0000 mg | Freq: Four times a day (QID) | INTRAMUSCULAR | Status: DC | PRN

## 2012-07-27 MED ORDER — SODIUM CHLORIDE 0.9 % IV SOLN: 250.0000 mL | INTRAVENOUS | Status: DC | PRN

## 2012-07-27 MED ORDER — HEPARIN SODIUM (PORCINE) 1000 UNIT/ML IJ SOLN
INTRAMUSCULAR | Status: AC
  Filled 2012-07-27: qty 1

## 2012-07-27 NOTE — Anesthesia Preprocedure Evaluation (Addendum)
Anesthesia Evaluation  Patient identified by MRN, date of birth, ID band Patient awake    Reviewed: Allergy & Precautions, H&P , NPO status , Patient's Chart, lab work & pertinent test results, reviewed documented beta blocker date and time   Airway Mallampati: II TM Distance: >3 FB Neck ROM: Full    Dental  (+) Teeth Intact and Dental Advisory Given   Pulmonary sleep apnea and Continuous Positive Airway Pressure Ventilation ,  breath sounds clear to auscultation        Cardiovascular hypertension, Pt. on home beta blockers + dysrhythmias Atrial Fibrillation Rhythm:Irregular Rate:Normal     Neuro/Psych    GI/Hepatic   Endo/Other    Renal/GU      Musculoskeletal   Abdominal   Peds  Hematology   Anesthesia Other Findings   Reproductive/Obstetrics                          Anesthesia Physical Anesthesia Plan  ASA: III  Anesthesia Plan: General   Post-op Pain Management:    Induction: Intravenous  Airway Management Planned: LMA  Additional Equipment:   Intra-op Plan:   Post-operative Plan:   Informed Consent: I have reviewed the patients History and Physical, chart, labs and discussed the procedure including the risks, benefits and alternatives for the proposed anesthesia with the patient or authorized representative who has indicated his/her understanding and acceptance.   Dental advisory given  Plan Discussed with: CRNA and Surgeon  Anesthesia Plan Comments: (Persistent AFib Htn   Plan GA with oral ETT  Kipp Brood, MD)       Anesthesia Quick Evaluation

## 2012-07-27 NOTE — Op Note (Signed)
SURGEON:  Hillis Range, MD  PREPROCEDURE DIAGNOSES: 1. Persistent atrial fibrillation.  POSTPROCEDURE DIAGNOSES: 1. Persistent atrial fibrillation.  PROCEDURES: 1. Comprehensive electrophysiologic study. 2. Coronary sinus pacing and recording. 3. Three-dimensional mapping of atrial fibrillation with additional mapping and ablation of a second atrial focus (CAFEs) 4. Ablation of atrial fibrillation with additional mapping and ablation of a second atrial focus (CAFEs) 5. Intracardiac echocardiography. 6. Transseptal puncture of an intact septum. 7. Rotational Angiography with processing at an independent workstation 8. Arrhythmia induction with pacing 9. External cardioversion.  INTRODUCTION:  Damon [name] is a 67 y.o. male with a history of persistent atrial fibrillation who now presents for EP study and radiofrequency ablation.  The patient reports initially being diagnosed with atrial fibrillation after presenting with symptomatic palpitations and fatgiue. The patient has symptomatic persistent atrial fibrillation. He has failed medical therapy with propafenone and multaq.   The patient therefore presents today for catheter ablation of atrial fibrillation.  DESCRIPTION OF PROCEDURE:  Informed written consent was obtained, and the patient was brought to the electrophysiology lab in a fasting state.  The patient was adequately sedated with intravenous medications as outlined in the anesthesia report.  The patient's left and right groins were prepped and draped in the usual sterile fashion by the EP lab staff.  Using a percutaneous Seldinger technique, two 7-French and one 11-French hemostasis sheath was placed into the right common femoral vein.   3 Dimensional Rotational Angiography: A 5 french pigtail catheter was introduced through the right common femoral vein and advanced into the inferior venocava.  3 demential rotational angiography was then performed by power injection of 100cc of  nonionic contrast.  Reprocessing at an independent work station was then performed.   This demonstrated a large sized left atrium with 4 separate pulmonary veins.  The inferior pulmonary veins were moderate in size. The superior pulmonary veins were large in size.  There were no anomalous veins or significant abnormalities.  A 3 dimensional rendering of the left atrium was then merged using [employer] onto the Well[employer] system and registered with intracardiac echo (see below).  The pigtail catheter was then removed.  Catheter Placement:  A 7-French Biosense Webster Decapolar coronary sinus catheter was introduced through the right common femoral vein and advanced into the coronary sinus for recording and pacing from this location.  A 6-French quadripolar Josephson catheter was introduced through the right common femoral vein and advanced into the right ventricle for recording and pacing.  This catheter was then pulled back to the His bundle location.    Initial Measurements: The patient presented to the electrophysiology lab in atrial fibrillation.  The HV interval 57 msec.     Intracardiac Echocardiography: A 10-French Biosense Webster AcuNav intracardiac echocardiography catheter was introduced through the left common femoral vein and advanced into the right atrium. Intracardiac echocardiography was performed of the left atrium, and a three-dimensional anatomical rendering of the left atrium was performed using CARTO sound technology.  The patient was noted to have a moderate sized left atrium.  The interatrial septum was prominent but not aneurysmal. All 4 pulmonary veins were visualized and noted to have separate ostia.   The left atrial appendage was visualized and did not reveal thrombus.   There was no evidence of pulmonary vein stenosis.   Transseptal Puncture: The middle right common femoral vein sheath was exchanged for an 8.5 [place] SL2 transseptal sheath and transseptal  access was achieved in a standard fashion using a  Brockenbrough needle under biplane fluoroscopy with intracardiac echocardiography confirmation of the transseptal puncture.  Once transseptal access had been achieved, heparin was administered intravenously and intra- arterially in order to maintain an ACT of greater than 350 seconds throughout the procedure.   3D Mapping and Ablation: The His bundle catheter was removed and in its place a 3.5 mm Biosense Webster EZ [employer] ablation catheter was advanced into the right atrium.  The transseptal sheath was pulled back into the IVC over a guidewire.  The ablation catheter was advanced across the transseptal hole using the wire as a guide.  The transseptal sheath was then re-advanced over the guidewire into the left atrium.  A duodecapolar Biosense Webster circular mapping catheter was introduced through the transseptal sheath and positioned over the mouth of all 4 pulmonary veins.  Three-dimensional electroanatomical mapping was performed using CARTO technology.  This demonstrated electrical activity within all four pulmonary veins at baseline. The patient underwent successful sequential electrical isolation and anatomical encircling of all four pulmonary veins using radiofrequency current with a circular mapping catheter as a guide.  Complex fractionated electrograms (CFAEs) were then identified and ablated along the roof of the left atrium, base of the left atrial appendage (LAA), the interatrial septum, along the lateral wall of the left atrium, and above the coronary sinus.     Cardioversion: The patient was then cardioverted to sinus rhythm with a single synchronized 360-J biphasic shock with cardioversion electrodes in the anterior-posterior thoracic configuration. He maintained sinus rhythm thereafter.  Measurements Following Ablation: In sinus rhythm with RR interval was 923, with PR , QRS 95 msec, and Qtc 448 msec.  Following ablation  the AH interval measured with an HV interval of 58 msec. Ventricular pacing was performed, which revealed VA dissociation when pacing at a cycle length of 600 msec.  Rapid atrial pacing was performed, which revealed an AV Wenckebach cycle length of 370 msec.  No arrhythmias were observed with rapid atrial pacing down to a cycle length of .  Electroisolation was then again confirmed in all four pulmonary veins.  Intracardiac echocardiography was again performed, which revealed no pericardial effusion.  The procedure was therefore considered completed.  All catheters were removed, and the sheaths were aspirated and flushed.  The patient was transferred to the recovery area for sheath removal per protocol.  A limited bedside transthoracic echocardiogram revealed no pericardial effusion.  There were no early apparent complications.  CONCLUSIONS: 1. Atrial fibrillation upon presentation.   2. Rotational Angiography reveals a large sized left atrium with four separate pulmonary veins without evidence of pulmonary vein stenosis. 3. Successful electrical isolation and anatomical encircling of all four pulmonary veins with radiofrequency current. 3. CFAEs were identified and ablated along the roof of the left atrium, base of the left atrial appendage (LAA),  the interatrial septum, along the lateral wall of the left atrium, and above the coronary sinus.   4. Atrial fibrillation successfully cardioverted to sinus rhythm. 5. No early apparent complications.   Fayrene Fearing Avondre Richens,MD 10:34 AM 07/27/2012

## 2012-07-27 NOTE — Preoperative (Signed)
Beta Blockers   Reason not to administer Beta Blockers:Not Applicable, last dose 07/26/12 at 19:00

## 2012-07-27 NOTE — Discharge Summary (Signed)
ELECTROPHYSIOLOGY PROCEDURE DISCHARGE SUMMARY    Patient ID: Damon [name],  MRN: [id number], DOB/AGE: December 20, 1945 67 y.o.  Admit date: 07/27/2012 Discharge date: 07/28/2012  Primary Care Physician: [name], MD Primary Cardiologist: Kyra Searles, MD Electrophysiologist: Samella Lucchetti/Allred  Primary Discharge Diagnosis:  Persistent atrial fibrillation status post ablation this admission  Secondary Discharge Diagnosis:  1.  Hypertension 2.  Sleep apnea- tx with CPAP 3.  Arthritis 4.  Chronic anticoagulation with Xarelto 5.  Hyperlipidemia 6.  Mitral regurgitation  Procedures This Admission:  1.  Electrophysiology study and radiofrequency catheter ablation on 07-27-2012 by Dr Johney Frame.  This study demonstrated atrial fibrillation upon presentation.  Rotational Angiography reveals a large sized left atrium with four separate pulmonary veins without evidence of pulmonary vein stenosis.  Successful electrical isolation and anatomical encircling of all four pulmonary veins with radiofrequency current was carried out. CFAEs were identified and ablated along the roof of the left atrium, base of the left atrial appendage (LAA), the interatrial septum, along the lateral wall of the left atrium, and above the coronary sinus.  Atrial fibrillation was successfully cardioverted to sinus rhythm. There were no early apparent complications.  Brief HPI: Damon [name] is a 67 y.o. male with a h/o persistent atrial fibrillation who was referred to Dr Johney Frame for consideration of ablation. He reports that his afib began 7 years ago and was initially triggered by ETOH. He was found at that time to have apparently near-normal left ventricular function and some degree of mitral regurgitation. He has failed medical therpay with propafenone and Dronaderone. He reports increasing frequency and duration of atrial fibrillation. He reports symptoms of palpitations and decreased exercise tolerance in afib. Prior  echo demonstrated normal left ventricular function, a left atrial dimension 4.8 and moderate mitral regurgitation. He quit ETOH but continues to have afib. He also uses CPAP for OSA. He is presently anticoagulated with xarelto. Therapeutic strategies for afib including medicine and ablation were discussed in detail with the patient today. Risk, benefits, and alternatives to EP study and radiofrequency ablation for afib were also discussed in detail and the patient wished to proceed. The patient underwent TEE on 07-26-2012 which demonstrated no LAA thrombus.  Hospital Course:  The patient was admitted and underwent ablation of atrial fibrillation with details as outlined above.  He was monitored on telemetry overnight which demonstrated sinus rhythm.  His groin incision is intact without bleeding or hematoma.  The morning after procedure, he was examined by Dr Graciela Husbands and considered stable for discharge to home.  The patient will follow up with Dr Johney Frame in 12 weeks. He will be maintained on anticoagulation until that visit.  The patient will be discharged on a PPI for 6 weeks following ablation.   Physical Exam: Vitals: Blood pressure 127/69, pulse 82, temperature 99.6 F (37.6 C), temperature source Oral, resp. rate 18, height 5' 9.5" (1.765 m), weight 205 lb (92.987 kg), SpO2 93.00%.   General: Well developed, well appearing 67 year old male in no acute distress. Heart: RRR. S1, S2 present. No murmur, rub or S3. Lungs: CTA bilaterally. No wheezes, rales or rhonchi. Abdomen: Soft, nondistended.  Extremities: No cyanosis, clubbing or edema. Groin site intact without bleeding or hematoma.   Labs:   Lab Results  Component Value Date   WBC 12.5* 07/19/2012   HGB 15.6 07/19/2012   HCT 45.3 07/19/2012   MCV 88.5 07/19/2012   PLT 213.0 07/19/2012     Discharge Medications:    Medication List  As of 07/28/2012  8:37 AM    TAKE these medications         bisoprolol-hydrochlorothiazide 10-6.25 MG  per tablet   Commonly known as: ZIAC   Take 0.5 tablets by mouth daily.      diltiazem 120 MG 24 hr capsule   Commonly known as: CARDIZEM CD   Take 120 mg by mouth 2 (two) times daily.      multivitamin with minerals Tabs   Take 1 tablet by mouth daily. FOR MEN      pantoprazole 40 MG tablet   Commonly known as: PROTONIX   Take 1 tablet (40 mg total) by mouth daily.      Rivaroxaban 20 MG Tabs   Commonly known as: XARELTO   Take 1 tablet (20 mg total) by mouth every evening. 07-11-11 Pt. Advised to notify MD -he remains taking this med.      rosuvastatin 20 MG tablet   Commonly known as: CRESTOR   Take 20 mg by mouth every evening.      Vitamin D-3 5000 UNITS Tabs   Take 1 tablet by mouth daily.        Follow-up: Follow-up Information    Follow up with Hillis Range, MD. On 10/15/2012. (At 2:00 PM)    Contact information:   [address] [place] [address] [phone]        Duration of Discharge Encounter: Greater than 30 minutes including physician time.  Results and recommendations reviewed

## 2012-07-27 NOTE — Anesthesia Postprocedure Evaluation (Signed)
  Anesthesia Post-op Note  Patient: Damon [name]  Procedure(s) Performed: Procedure(s) (LRB) with comments: ATRIAL FIBRILLATION ABLATION (N/A)  Patient Location: Cath Lab  Anesthesia Type:General  Level of Consciousness: awake, alert  and oriented  Airway and Oxygen Therapy: Patient Spontanous Breathing and Patient connected to nasal cannula oxygen  Post-op Pain: mild  Post-op Assessment: Post-op Vital signs reviewed, Patient's Cardiovascular Status Stable, Respiratory Function Stable, Patent Airway and Pain level controlled  Post-op Vital Signs: stable  Complications: No apparent anesthesia complications

## 2012-07-27 NOTE — Anesthesia Procedure Notes (Signed)
Procedure Name: LMA Insertion Date/Time: 07/27/2012 8:07 AM Performed by: Jefm Miles E Pre-anesthesia Checklist: Patient identified, Patient being monitored, Emergency Drugs available, Timeout performed and Suction available Patient Re-evaluated:Patient Re-evaluated prior to inductionOxygen Delivery Method: Circle system utilized Preoxygenation: Pre-oxygenation with 100% oxygen Intubation Type: IV induction LMA: LMA inserted LMA Size: 4.0 Number of attempts: 1 Placement Confirmation: positive ETCO2 and breath sounds checked- equal and bilateral Tube secured with: Tape Dental Injury: Teeth and Oropharynx as per pre-operative assessment

## 2012-07-27 NOTE — Progress Notes (Signed)
RT Note: Cpap set up at 13cm H2O with Full face mask per home use. Pt states he can place himself on when ready. RT will continue to monitor

## 2012-07-27 NOTE — Transfer of Care (Signed)
Immediate Anesthesia Transfer of Care Note  Patient: Damon [name]  Procedure(s) Performed: Procedure(s) (LRB) with comments: ATRIAL FIBRILLATION ABLATION (N/A)  Patient Location: PACU  Anesthesia Type:General  Level of Consciousness: awake, alert  and oriented  Airway & Oxygen Therapy: Patient Spontanous Breathing  Post-op Assessment: Report given to PACU RN  Post vital signs: Reviewed and stable  Complications: No apparent anesthesia complications

## 2012-07-27 NOTE — H&P (Signed)
Primary Care Physician: MCKEOWN,WILLIAM DAVID, MD  Referring Physician: Dr Klein  Primary Cardiologist: Dr Kowalski   Damon [name] is a 66 y.o. male with a h/o persistent atrial fibrillation who presents today for consideration of ablation. He reports that his afib began 7 years ago and was initially triggered by ETOH. He was found at that time to have apparently near-normal left ventricular function and some degree of mitral regurgitation. He has failed medical therpay with propafenone and Dronaderone. He reports increasing frequency and duration of atrial fibrillation. He reports symptoms of palpitations and decreased exercise tolerance in afib. Prior echo demonstrated normal left ventricular function, a left atrial dimension 4.8 and moderate mitral regurgitation. He quit ETOH but continues to have afib. He also uses CPAP for OSA. He is presently anticoagulated with xarelto.  Today, he denies symptoms of chest pain, shortness of breath, orthopnea, PND, lower extremity edema, dizziness, presyncope, syncope, or neurologic sequela. The patient is tolerating medications without difficulties and is otherwise without complaint today.   Past Medical History   Diagnosis  Date   .  Hypertension    .  Persistent atrial fibrillation  07-11-11     Cardioverted x3   .  Sleep apnea  07-11-11     Cpap used nightly most times   .  Colon polyps  07-11-11     past hx. x 2 (benign)   .  Arthritis  07-11-11     Osteoarthritis-hips, s/p LTHA, now rt. planned   .  Hearing loss  07-11-11     Bilaterally   .  Hyperlipidemia    .  Mitral regurgitation     Past Surgical History   Procedure  Date   .  Joint replacement  07-11-11     LTHA   .  Cataract extraction, bilateral  07-11-11     also Lasik surgery   .  Tonsillectomy  07-11-11     child   .  Cardioversion  07-11-11     x3 due to hx. A. Fib   .  Total hip arthroplasty  07/25/2011     Procedure: TOTAL HIP ARTHROPLASTY ANTERIOR APPROACH; Surgeon:  Christopher Y Blackman; Location: [facility]; Service: Orthopedics; Laterality: Right;    Current Outpatient Prescriptions   Medication  Sig  Dispense  Refill   .  Ascorbic Acid (VITAMIN C CR) 1500 MG TBCR  Take 1 tablet by mouth daily.     .  bisoprolol-hydrochlorothiazide (ZIAC) 10-6.25 MG per tablet  Take 1 tablet by mouth daily.     .  Cholecalciferol (VITAMIN D-3) 5000 UNITS TABS  Take 2 tablets by mouth daily.     .  diltiazem (DILACOR XR) 120 MG 24 hr capsule  Take one capsule by mouth daily     .  fish oil-omega-3 fatty acids 1000 MG capsule  Take 1 g by mouth as needed.     .  MAGNESIUM PO  Take 1 tablet by mouth every other day.     .  Multiple Vitamin (MULITIVITAMIN WITH MINERALS) TABS  Take 1 tablet by mouth daily. FOR MEN     .  Rivaroxaban (XARELTO) 20 MG TABS  Take 1 tablet (20 mg total) by mouth every evening. 07-11-11 Pt. Advised to notify MD -he remains taking this med.  30 tablet  6   .  rosuvastatin (CRESTOR) 20 MG tablet  Take 20 mg by mouth every evening.     .  amiodarone (PACERONE) 400 MG tablet    Take one tablet by mouth twice daily for 10 days, then decrease to one tablet by mouth once daily  60 tablet  2    No Known Allergies  History    Social History   .  Marital Status:  Married     Spouse Name:  N/A     Number of Children:  N/A   .  Years of Education:  N/A    Occupational History   .  Not on file.    Social History Main Topics   .  Smoking status:  Former Smoker -- 2.0 packs/day for 10 years     Types:  Cigarettes     Quit date:  09/20/1981   .  Smokeless tobacco:  Not on file   .  Alcohol Use:  No   .  Drug Use:  No   .  Sexually Active:  Yes    Other Topics  Concern   .  Not on file    Social History Narrative   .  No narrative on file    Family History   Problem  Relation  Age of Onset   .  Heart attack  Mother    .  Heart failure  Father     ROS- All systems are reviewed and negative except as per the HPI above   Physical Exam:  Filed  Vitals:    06/14/12 1503   BP:  123/86   Pulse:  86   Height:  5' 9.5" (1.765 m)   Weight:  214 lb 6.4 oz (97.251 kg)    GEN- The patient is well appearing, alert and oriented x 3 today.  Head- normocephalic, atraumatic  Eyes- Sclera clear, conjunctiva pink  Ears- hearing intact  Oropharynx- clear  Neck- supple, no JVP  Lymph- no cervical lymphadenopathy  Lungs- Clear to ausculation bilaterally, normal work of breathing  Heart-irregular rate and rhythm, no murmurs, rubs or gallops, PMI not laterally displaced  GI- soft, NT, ND, + BS  Extremities- no clubbing, cyanosis, or edema  MS- no significant deformity or atrophy  Skin- no rash or lesion  Psych- euthymic mood, full affect  Neuro- strength and sensation are intact    Assessment and Plan:   Atrial fibrillation - Latravia Southgate, MD  The patient has symptomatic persistent atrial fibrillation. He has failed medical therapy with propafenone and multaq.  Therapeutic strategies for afib including medicine and ablation were discussed in detail with the patient today. Risk, benefits, and alternatives to EP study and radiofrequency ablation for afib were also discussed in detail today. These risks include but are not limited to stroke, bleeding, vascular damage, tamponade, perforation, damage to the esophagus, lungs, and other structures, pulmonary vein stenosis, worsening renal function, and death. The patient understands these risk and wishes to proceed. We will therefore proceed with catheter ablation at the next available time. The importance of compliance with xarelto was stressed today.  We will perform TEE prior to his ablation to further evaluate his mitral regurgitation. If his MR has progressed, then he may require surgical intervention and MAZE rather than ablation. This will be determined following his TEE.   Mitral regurgitation - Haidar Muse, MD   As above  Hypertension - Randi College, MD   Stable  No change required today     Sleep apnea - Rochel Privett, MD   Compliance with CPAP is encouraged    

## 2012-07-28 DIAGNOSIS — I4891 Unspecified atrial fibrillation: Secondary | ICD-10-CM

## 2012-07-28 MED ORDER — PANTOPRAZOLE SODIUM 40 MG PO TBEC: 40.0000 mg | DELAYED_RELEASE_TABLET | Freq: Every day | ORAL | Status: DC

## 2012-07-30 ENCOUNTER — Telehealth: Payer: Self-pay | Admitting: Internal Medicine

## 2012-07-30 NOTE — Telephone Encounter (Signed)
PT HAS BEEN OUT OF RHYTHM SINCE YESTERDAY ABOUT 12 NOON

## 2012-07-30 NOTE — Telephone Encounter (Signed)
Pt had afib ablation 3 days ago, bp 110/78 p 100, feels ok/ denies SOB, worried because he has been out of rhythm. Discussed with Nehemiah Settle PA and she confirmed that this is normal and to call if symptoms change or pulse is over 110 bpm, pt agrees to call with questions or concerns and was told to call on Monday for an update or call during the weekend if any problems and he will be directed to on call staff, pt felt more assured and agreed to plan.

## 2012-08-02 ENCOUNTER — Encounter: Payer: Self-pay | Admitting: Internal Medicine

## 2012-08-02 NOTE — Telephone Encounter (Signed)
Pt has been in a-fib since Thursday and his ablation was Tuesday

## 2012-08-02 NOTE — Telephone Encounter (Signed)
Called and lmom for patient with Dr Jenel Lucks recommendations  He is to call me Wed morning and let me know if he is still out of rhythm.  If he is we will have him come over for an EKG and schedule a DCCV

## 2012-08-02 NOTE — Telephone Encounter (Signed)
Follow-up:    Patient called in because he is still in Afib and would like to know how to proceed.  Please call back.

## 2012-08-02 NOTE — Telephone Encounter (Signed)
This encounter was created in error - please disregard.

## 2012-08-02 NOTE — Telephone Encounter (Signed)
Damon [name] 08/02/2012 8:08 AM Signed  Pt has been in a-fib since Thursday and his ablation was Tuesday

## 2012-08-02 NOTE — Telephone Encounter (Signed)
Will need to have an ekg on Wednesday of this week. If he remains in afib, then he will likely need cardioversion.

## 2012-08-04 ENCOUNTER — Encounter: Payer: Self-pay | Admitting: *Deleted

## 2012-08-04 ENCOUNTER — Ambulatory Visit (INDEPENDENT_AMBULATORY_CARE_PROVIDER_SITE_OTHER): Payer: Medicare Other | Admitting: *Deleted

## 2012-08-04 ENCOUNTER — Ambulatory Visit: Payer: Medicare Other | Admitting: Internal Medicine

## 2012-08-04 DIAGNOSIS — I4891 Unspecified atrial fibrillation: Secondary | ICD-10-CM

## 2012-08-04 LAB — CBC WITH DIFFERENTIAL/PLATELET
Basophils Absolute: 0 10*3/uL (ref 0.0–0.1)
Basophils Relative: 0.5 % (ref 0.0–3.0)
Eosinophils Absolute: 0.2 10*3/uL (ref 0.0–0.7)
MCHC: 34 g/dL (ref 30.0–36.0)
MCV: 87.4 fl (ref 78.0–100.0)
Monocytes Absolute: 0.7 10*3/uL (ref 0.1–1.0)
Neutrophils Relative %: 71.8 % (ref 43.0–77.0)
RBC: 4.86 Mil/uL (ref 4.22–5.81)
RDW: 12.8 % (ref 11.5–14.6)

## 2012-08-04 LAB — BASIC METABOLIC PANEL
BUN: 17 mg/dL (ref 6–23)
CO2: 30 mEq/L (ref 19–32)
Chloride: 106 mEq/L (ref 96–112)
Creatinine, Ser: 1 mg/dL (ref 0.4–1.5)
Glucose, Bld: 96 mg/dL (ref 70–99)

## 2012-08-04 NOTE — Telephone Encounter (Signed)
DCCV set up for Fri

## 2012-08-05 ENCOUNTER — Other Ambulatory Visit: Payer: Self-pay | Admitting: *Deleted

## 2012-08-06 ENCOUNTER — Encounter (HOSPITAL_COMMUNITY): Payer: Self-pay | Admitting: *Deleted

## 2012-08-06 ENCOUNTER — Encounter (HOSPITAL_COMMUNITY)
Admission: RE | Disposition: A | Discharge status: Home / Self Care | Payer: Self-pay | Source: Ambulatory Visit | Attending: Internal Medicine

## 2012-08-06 ENCOUNTER — Ambulatory Visit (HOSPITAL_COMMUNITY): Payer: Medicare Other | Admitting: *Deleted

## 2012-08-06 ENCOUNTER — Ambulatory Visit (HOSPITAL_COMMUNITY)
Admission: RE | Admit: 2012-08-06 | Discharge: 2012-08-06 | Disposition: A | Discharge status: Home / Self Care | Payer: Medicare Other | Source: Ambulatory Visit | Attending: Internal Medicine | Admitting: Internal Medicine

## 2012-08-06 DIAGNOSIS — I4891 Unspecified atrial fibrillation: Secondary | ICD-10-CM | POA: Insufficient documentation

## 2012-08-06 DIAGNOSIS — Z79899 Other long term (current) drug therapy: Secondary | ICD-10-CM | POA: Insufficient documentation

## 2012-08-06 DIAGNOSIS — I059 Rheumatic mitral valve disease, unspecified: Secondary | ICD-10-CM | POA: Insufficient documentation

## 2012-08-06 DIAGNOSIS — Z7902 Long term (current) use of antithrombotics/antiplatelets: Secondary | ICD-10-CM | POA: Insufficient documentation

## 2012-08-06 DIAGNOSIS — E785 Hyperlipidemia, unspecified: Secondary | ICD-10-CM | POA: Insufficient documentation

## 2012-08-06 DIAGNOSIS — G4733 Obstructive sleep apnea (adult) (pediatric): Secondary | ICD-10-CM | POA: Insufficient documentation

## 2012-08-06 DIAGNOSIS — Z7901 Long term (current) use of anticoagulants: Secondary | ICD-10-CM | POA: Insufficient documentation

## 2012-08-06 DIAGNOSIS — I10 Essential (primary) hypertension: Secondary | ICD-10-CM | POA: Insufficient documentation

## 2012-08-06 HISTORY — PX: CARDIOVERSION: SHX1299

## 2012-08-06 SURGERY — CARDIOVERSION
Anesthesia: General

## 2012-08-06 MED ORDER — SODIUM CHLORIDE 0.9 % IV SOLN
INTRAVENOUS | Status: DC | PRN
  Administered 2012-08-06: 10:00:00 via INTRAVENOUS

## 2012-08-06 MED ORDER — PROPOFOL 10 MG/ML IV BOLUS
INTRAVENOUS | Status: DC | PRN
  Administered 2012-08-06: 60 mg via INTRAVENOUS

## 2012-08-06 MED ORDER — SODIUM CHLORIDE 0.9 % IV SOLN
INTRAVENOUS | Status: DC
  Administered 2012-08-06: 500 mL via INTRAVENOUS

## 2012-08-06 NOTE — Op Note (Signed)
Patient anesthetized with 60 mg Propofol IV  With pads in AP position patient cardioverted to SB with 200J synchronized biphasic energy. Procedure wihout complications.

## 2012-08-06 NOTE — Preoperative (Signed)
Beta Blockers   Reason not to administer Beta Blockers:Not Applicable, took last PM, QD dosing

## 2012-08-06 NOTE — Interval H&P Note (Signed)
History and Physical Interval Note:  08/06/2012 10:56 AM  Damon [name]  has presented today for surgery, with the diagnosis of a-fib  The various methods of treatment have been discussed with the patient and family. After consideration of risks, benefits and other options for treatment, the patient has consented to  Procedure(s) (LRB) with comments: CARDIOVERSION (N/A) as a surgical intervention .  The patient's history has been reviewed, patient examined, no change in status, stable for surgery.  I have reviewed the patient's chart and labs.  Questions were answered to the patient's satisfaction.     [name]  Patient went back into atrial fibrillation.  For DCCV today.

## 2012-08-06 NOTE — Anesthesia Preprocedure Evaluation (Addendum)
Anesthesia Evaluation  Patient identified by MRN, date of birth, ID band Patient awake    Reviewed: Allergy & Precautions, H&P , NPO status , Patient's Chart, lab work & pertinent test results, reviewed documented beta blocker date and time   Airway Mallampati: II TM Distance: >3 FB Neck ROM: Full    Dental  (+) Teeth Intact   Pulmonary sleep apnea and Continuous Positive Airway Pressure Ventilation ,  breath sounds clear to auscultation        Cardiovascular hypertension, Pt. on medications and Pt. on home beta blockers + dysrhythmias Atrial Fibrillation Rhythm:Irregular Rate:Normal     Neuro/Psych negative neurological ROS     GI/Hepatic negative GI ROS, Neg liver ROS,   Endo/Other  negative endocrine ROS  Renal/GU negative Renal ROS     Musculoskeletal negative musculoskeletal ROS (+)   Abdominal Normal abdominal exam  (+)   Peds  Hematology negative hematology ROS (+)   Anesthesia Other Findings   Reproductive/Obstetrics                           Anesthesia Physical Anesthesia Plan  ASA: II  Anesthesia Plan: General   Post-op Pain Management:    Induction: Intravenous  Airway Management Planned: Mask  Additional Equipment:   Intra-op Plan:   Post-operative Plan:   Informed Consent: I have reviewed the patients History and Physical, chart, labs and discussed the procedure including the risks, benefits and alternatives for the proposed anesthesia with the patient or authorized representative who has indicated his/her understanding and acceptance.   Dental advisory given  Plan Discussed with: Anesthesiologist, Surgeon and CRNA  Anesthesia Plan Comments:        Anesthesia Quick Evaluation

## 2012-08-06 NOTE — Anesthesia Postprocedure Evaluation (Signed)
  Anesthesia Post-op Note  Patient: Damon [name]  Procedure(s) Performed: Procedure(s) (LRB) with comments: CARDIOVERSION (N/A)  Patient Location: PACU and Endoscopy Unit  Anesthesia Type:General  Level of Consciousness: awake, oriented and patient cooperative  Airway and Oxygen Therapy: Patient Spontanous Breathing and Patient connected to nasal cannula oxygen  Post-op Pain: none  Post-op Assessment: Post-op Vital signs reviewed, Patient's Cardiovascular Status Stable and Respiratory Function Stable  Post-op Vital Signs: Reviewed and stable  Complications: No apparent anesthesia complications

## 2012-08-06 NOTE — H&P (View-Only) (Signed)
Primary Care Physician: Nadean Corwin, MD  Referring Physician: Dr Graciela Husbands  Primary Cardiologist: Dr Lupita Dawn is a 67 y.o. male with a h/o persistent atrial fibrillation who presents today for consideration of ablation. He reports that his afib began 7 years ago and was initially triggered by ETOH. He was found at that time to have apparently near-normal left ventricular function and some degree of mitral regurgitation. He has failed medical therpay with propafenone and Dronaderone. He reports increasing frequency and duration of atrial fibrillation. He reports symptoms of palpitations and decreased exercise tolerance in afib. Prior echo demonstrated normal left ventricular function, a left atrial dimension 4.8 and moderate mitral regurgitation. He quit ETOH but continues to have afib. He also uses CPAP for OSA. He is presently anticoagulated with xarelto.  Today, he denies symptoms of chest pain, shortness of breath, orthopnea, PND, lower extremity edema, dizziness, presyncope, syncope, or neurologic sequela. The patient is tolerating medications without difficulties and is otherwise without complaint today.   Past Medical History   Diagnosis  Date   .  Hypertension    .  Persistent atrial fibrillation  07-11-11     Cardioverted x3   .  Sleep apnea  07-11-11     Cpap used nightly most times   .  Colon polyps  07-11-11     past hx. x 2 (benign)   .  Arthritis  07-11-11     Osteoarthritis-hips, s/p LTHA, now rt. planned   .  Hearing loss  07-11-11     Bilaterally   .  Hyperlipidemia    .  Mitral regurgitation     Past Surgical History   Procedure  Date   .  Joint replacement  07-11-11     LTHA   .  Cataract extraction, bilateral  07-11-11     also Lasik surgery   .  Tonsillectomy  07-11-11     child   .  Cardioversion  07-11-11     x3 due to hx. A. Fib   .  Total hip arthroplasty  07/25/2011     Procedure: TOTAL HIP ARTHROPLASTY ANTERIOR APPROACH; Surgeon:  Kathryne Hitch; Location: [facility]; Service: Orthopedics; Laterality: Right;    Current Outpatient Prescriptions   Medication  Sig  Dispense  Refill   .  Ascorbic Acid (VITAMIN C CR) 1500 MG TBCR  Take 1 tablet by mouth daily.     .  bisoprolol-hydrochlorothiazide (ZIAC) 10-6.25 MG per tablet  Take 1 tablet by mouth daily.     .  Cholecalciferol (VITAMIN D-3) 5000 UNITS TABS  Take 2 tablets by mouth daily.     Marland Kitchen  diltiazem (DILACOR XR) 120 MG 24 hr capsule  Take one capsule by mouth daily     .  fish oil-omega-3 fatty acids 1000 MG capsule  Take 1 g by mouth as needed.     Marland Kitchen  MAGNESIUM PO  Take 1 tablet by mouth every other day.     .  Multiple Vitamin (MULITIVITAMIN WITH MINERALS) TABS  Take 1 tablet by mouth daily. FOR MEN     .  Rivaroxaban (XARELTO) 20 MG TABS  Take 1 tablet (20 mg total) by mouth every evening. 07-11-11 Pt. Advised to notify MD -he remains taking this med.  30 tablet  6   .  rosuvastatin (CRESTOR) 20 MG tablet  Take 20 mg by mouth every evening.     Marland Kitchen  amiodarone (PACERONE) 400 MG tablet  Take one tablet by mouth twice daily for 10 days, then decrease to one tablet by mouth once daily  60 tablet  2    No Known Allergies  History    Social History   .  Marital Status:  Married     Spouse Name:  N/A     Number of Children:  N/A   .  Years of Education:  N/A    Occupational History   .  Not on file.    Social History Main Topics   .  Smoking status:  Former Smoker -- 2.0 packs/day for 10 years     Types:  Cigarettes     Quit date:  09/20/1981   .  Smokeless tobacco:  Not on file   .  Alcohol Use:  No   .  Drug Use:  No   .  Sexually Active:  Yes    Other Topics  Concern   .  Not on file    Social History Narrative   .  No narrative on file    Family History   Problem  Relation  Age of Onset   .  Heart attack  Mother    .  Heart failure  Father     ROS- All systems are reviewed and negative except as per the HPI above   Physical Exam:  Filed  Vitals:    06/14/12 1503   BP:  123/86   Pulse:  86   Height:  5' 9.5" (1.765 m)   Weight:  214 lb 6.4 oz (97.251 kg)    GEN- The patient is well appearing, alert and oriented x 3 today.  Head- normocephalic, atraumatic  Eyes- Sclera clear, conjunctiva pink  Ears- hearing intact  Oropharynx- clear  Neck- supple, no JVP  Lymph- no cervical lymphadenopathy  Lungs- Clear to ausculation bilaterally, normal work of breathing  Heart-irregular rate and rhythm, no murmurs, rubs or gallops, PMI not laterally displaced  GI- soft, NT, ND, + BS  Extremities- no clubbing, cyanosis, or edema  MS- no significant deformity or atrophy  Skin- no rash or lesion  Psych- euthymic mood, full affect  Neuro- strength and sensation are intact    Assessment and Plan:   Atrial fibrillation - Hillis Range, MD  The patient has symptomatic persistent atrial fibrillation. He has failed medical therapy with propafenone and multaq.  Therapeutic strategies for afib including medicine and ablation were discussed in detail with the patient today. Risk, benefits, and alternatives to EP study and radiofrequency ablation for afib were also discussed in detail today. These risks include but are not limited to stroke, bleeding, vascular damage, tamponade, perforation, damage to the esophagus, lungs, and other structures, pulmonary vein stenosis, worsening renal function, and death. The patient understands these risk and wishes to proceed. We will therefore proceed with catheter ablation at the next available time. The importance of compliance with xarelto was stressed today.  We will perform TEE prior to his ablation to further evaluate his mitral regurgitation. If his MR has progressed, then he may require surgical intervention and MAZE rather than ablation. This will be determined following his TEE.   Mitral regurgitation - Hillis Range, MD   As above  Hypertension - Hillis Range, MD   Stable  No change required today     Sleep apnea - Hillis Range, MD   Compliance with CPAP is encouraged

## 2012-08-06 NOTE — Transfer of Care (Signed)
Immediate Anesthesia Transfer of Care Note  Patient: Damon [name]  Procedure(s) Performed: Procedure(s) (LRB) with comments: CARDIOVERSION (N/A)  Patient Location: PACU and Endoscopy Unit  Anesthesia Type:General  Level of Consciousness: awake, oriented and patient cooperative  Airway & Oxygen Therapy: Patient Spontanous Breathing and Patient connected to nasal cannula oxygen  Post-op Assessment: Report given to PACU RN and Post -op Vital signs reviewed and stable  Post vital signs: Reviewed and stable  Complications: No apparent anesthesia complications

## 2012-08-10 ENCOUNTER — Encounter (HOSPITAL_COMMUNITY): Payer: Self-pay | Admitting: Internal Medicine

## 2012-09-06 ENCOUNTER — Ambulatory Visit (INDEPENDENT_AMBULATORY_CARE_PROVIDER_SITE_OTHER): Payer: Medicare Other | Admitting: Internal Medicine

## 2012-09-06 ENCOUNTER — Encounter: Payer: Self-pay | Admitting: Internal Medicine

## 2012-09-06 VITALS — BP 118/67 | HR 44 | Ht 69.0 in | Wt 214.0 lb

## 2012-09-06 DIAGNOSIS — I4891 Unspecified atrial fibrillation: Secondary | ICD-10-CM

## 2012-09-06 NOTE — Patient Instructions (Addendum)
Keep appointment as scheduled

## 2012-09-06 NOTE — Assessment & Plan Note (Signed)
Doing well s/p ablation He remains within the 3 month healing period  No changes today

## 2012-09-06 NOTE — Progress Notes (Signed)
Primary Care Physician: Nadean Corwin, MD  Referring Physician: Dr Graciela Husbands  Primary Cardiologist: Dr Lupita Dawn is a 67 y.o. male who presents today for routine electrophysiology followup.  Since his recent afib ablation, the patient reports doing very well.  He denies procedure related complications.  He had ERAF immediately after the procedure and required cardioversion 08/06/12.  He has had no further sustained afib since that time.  Today, he denies symptoms of palpitations, chest pain, shortness of breath,  lower extremity edema, dizziness, presyncope, or syncope.  The patient is otherwise without complaint today.   Past Medical History  Diagnosis Date  . Hypertension   . Persistent atrial fibrillation 07-11-11     Cardioverted x3  . Sleep apnea 07-11-11    Cpap used nightly most times  . Colon polyps 07-11-11    past hx. x 2 (benign)  . Arthritis 07-11-11    Osteoarthritis-hips, s/p LTHA, now rt. planned  . Hearing loss 07-11-11    Bilaterally  . Hyperlipidemia   . Mitral regurgitation    Past Surgical History  Procedure Laterality Date  . Joint replacement  07-11-11    LTHA  . Cataract extraction, bilateral  07-11-11    also Lasik surgery  . Tonsillectomy  07-11-11    child  . Cardioversion  07-11-11    x3 due to hx. A. Fib  . Total hip arthroplasty  07/25/2011    Procedure: TOTAL HIP ARTHROPLASTY ANTERIOR APPROACH;  Surgeon: Kathryne Hitch;  Location: [facility];  Service: Orthopedics;  Laterality: Right;  . Tee without cardioversion  07/26/2012    Procedure: TRANSESOPHAGEAL ECHOCARDIOGRAM (TEE);  Surgeon: Pricilla Riffle, MD;  Location: [facility] ENDOSCOPY;  Service: Cardiovascular;  Laterality: N/A;  . Ablation of dysrhythmic focus  07/28/2011    Afib ablation by Dr Johney Frame  . Cardioversion  08/06/2012    Procedure: CARDIOVERSION;  Surgeon: Pricilla Riffle, MD;  Location: [facility] ENDOSCOPY;  Service: Cardiovascular;  Laterality: N/A;    Current Outpatient  Prescriptions  Medication Sig Dispense Refill  . bisoprolol-hydrochlorothiazide (ZIAC) 10-6.25 MG per tablet Take 1 tablet by mouth daily.       . Cholecalciferol (VITAMIN D-3) 5000 UNITS TABS Take 1 tablet by mouth daily.       Marland Kitchen diltiazem (CARDIZEM CD) 120 MG 24 hr capsule Take 120 mg by mouth 2 (two) times daily.      . Multiple Vitamin (MULITIVITAMIN WITH MINERALS) TABS Take 1 tablet by mouth daily. FOR MEN      . pantoprazole (PROTONIX) 40 MG tablet Take 1 tablet (40 mg total) by mouth daily.  30 tablet  1  . Rivaroxaban (XARELTO) 20 MG TABS Take 20 mg by mouth every evening.      . rosuvastatin (CRESTOR) 20 MG tablet Take 20 mg by mouth every evening.        No current facility-administered medications for this visit.    Physical Exam: Filed Vitals:   09/06/12 0938  BP: 118/67  Pulse: 44  Height: 5\' 9"  (1.753 m)  Weight: 214 lb (97.07 kg)    GEN- The patient is well appearing, alert and oriented x 3 today.   Head- normocephalic, atraumatic Eyes-  Sclera clear, conjunctiva pink Ears- hearing intact Oropharynx- clear Lungs- Clear to ausculation bilaterally, normal work of breathing Heart- Regular rate and rhythm, no murmurs, rubs or gallops, PMI not laterally displaced GI- soft, NT, ND, + BS Extremities- no clubbing, cyanosis, or edema  ekg today reveals  sinus bradycardia 45 bpm, otherwise normal ekg  Assessment and Plan:

## 2012-10-15 ENCOUNTER — Encounter: Payer: Self-pay | Admitting: Internal Medicine

## 2012-10-15 ENCOUNTER — Ambulatory Visit (INDEPENDENT_AMBULATORY_CARE_PROVIDER_SITE_OTHER): Payer: Medicare Other | Admitting: Internal Medicine

## 2012-10-15 VITALS — BP 122/60 | HR 51 | Ht 69.5 in | Wt 210.8 lb

## 2012-10-15 DIAGNOSIS — I4891 Unspecified atrial fibrillation: Secondary | ICD-10-CM

## 2012-10-15 MED ORDER — DILTIAZEM HCL ER COATED BEADS 120 MG PO CP24: 120.0000 mg | ORAL_CAPSULE | Freq: Every day | ORAL | Status: DC

## 2012-10-15 NOTE — Patient Instructions (Signed)
Your physician recommends that you schedule a follow-up appointment in: 3 months with Dr Johney Frame  Your physician has recommended you make the following change in your medication:  1) decrease Cardizem to 120mg  daily

## 2012-10-15 NOTE — Progress Notes (Signed)
PCP: Nadean Corwin, MD  Damon [name] is a 67 y.o. male who presents today for routine electrophysiology followup.  Since his afib ablation, the patient reports doing very well.  Today, he denies symptoms of palpitations, chest pain, shortness of breath,  lower extremity edema, dizziness, presyncope, or syncope.  The patient is otherwise without complaint today.   Past Medical History  Diagnosis Date  . Hypertension   . Persistent atrial fibrillation 07-11-11     Cardioverted x3  . Sleep apnea 07-11-11    Cpap used nightly most times  . Colon polyps 07-11-11    past hx. x 2 (benign)  . Arthritis 07-11-11    Osteoarthritis-hips, s/p LTHA, now rt. planned  . Hearing loss 07-11-11    Bilaterally  . Hyperlipidemia   . Mitral regurgitation    Past Surgical History  Procedure Laterality Date  . Joint replacement  07-11-11    LTHA  . Cataract extraction, bilateral  07-11-11    also Lasik surgery  . Tonsillectomy  07-11-11    child  . Cardioversion  07-11-11    x3 due to hx. A. Fib  . Total hip arthroplasty  07/25/2011    Procedure: TOTAL HIP ARTHROPLASTY ANTERIOR APPROACH;  Surgeon: Kathryne Hitch;  Location: [facility];  Service: Orthopedics;  Laterality: Right;  . Tee without cardioversion  07/26/2012    Procedure: TRANSESOPHAGEAL ECHOCARDIOGRAM (TEE);  Surgeon: Pricilla Riffle, MD;  Location: [facility] ENDOSCOPY;  Service: Cardiovascular;  Laterality: N/A;  . Ablation of dysrhythmic focus  07/28/2011    Afib ablation by Dr Johney Frame  . Cardioversion  08/06/2012    Procedure: CARDIOVERSION;  Surgeon: Pricilla Riffle, MD;  Location: [facility] ENDOSCOPY;  Service: Cardiovascular;  Laterality: N/A;    Current Outpatient Prescriptions  Medication Sig Dispense Refill  . bisoprolol-hydrochlorothiazide (ZIAC) 10-6.25 MG per tablet Take 1 tablet by mouth daily.       . Cholecalciferol (VITAMIN D-3) 5000 UNITS TABS Take 1 tablet by mouth daily.       Marland Kitchen diltiazem (CARDIZEM CD) 120 MG 24 hr capsule  Take 1 capsule (120 mg total) by mouth daily.      . Multiple Vitamin (MULITIVITAMIN WITH MINERALS) TABS Take 1 tablet by mouth daily. FOR MEN      . Rivaroxaban (XARELTO) 20 MG TABS Take 20 mg by mouth every evening.      . rosuvastatin (CRESTOR) 20 MG tablet Take 20 mg by mouth every evening.        No current facility-administered medications for this visit.    Physical Exam: Filed Vitals:   10/15/12 1409  BP: 122/60  Pulse: 51  Height: 5' 9.5" (1.765 m)  Weight: 210 lb 12.8 oz (95.618 kg)    GEN- The patient is well appearing, alert and oriented x 3 today.   Head- normocephalic, atraumatic Eyes-  Sclera clear, conjunctiva pink Ears- hearing intact Oropharynx- clear Lungs- Clear to ausculation bilaterally, normal work of breathing Heart- Regular rate and rhythm, no murmurs, rubs or gallops, PMI not laterally displaced GI- soft, NT, ND, + BS Extremities- no clubbing, cyanosis, or edema  ekg today reveals sinus bradycardia 52 bpm, otherwise normal ekg  Assessment and Plan:  1. afib Doing well s/p ablation  Maintaining sinus rhythm off of AAD Continue anticoagulation Decrease diltiazem to 120mg  daily  Return in 3 months

## 2012-11-10 ENCOUNTER — Other Ambulatory Visit: Payer: Self-pay | Admitting: Internal Medicine

## 2013-01-13 ENCOUNTER — Encounter: Payer: Self-pay | Admitting: Internal Medicine

## 2013-01-13 ENCOUNTER — Ambulatory Visit (INDEPENDENT_AMBULATORY_CARE_PROVIDER_SITE_OTHER): Payer: Medicare Other | Admitting: Internal Medicine

## 2013-01-13 VITALS — BP 112/65 | HR 50 | Wt 205.8 lb

## 2013-01-13 DIAGNOSIS — I10 Essential (primary) hypertension: Secondary | ICD-10-CM

## 2013-01-13 DIAGNOSIS — M79609 Pain in unspecified limb: Secondary | ICD-10-CM

## 2013-01-13 DIAGNOSIS — I4891 Unspecified atrial fibrillation: Secondary | ICD-10-CM

## 2013-01-13 DIAGNOSIS — M79662 Pain in left lower leg: Secondary | ICD-10-CM

## 2013-01-13 NOTE — Patient Instructions (Addendum)
Your physician wants you to follow-up in: 6 months with  Dr Jacquiline Doe will receive a reminder letter in the mail two months in advance. If you don't receive a letter, please call our office to schedule the follow-up appointment.  Your physician has recommended you make the following change in your medication:  1) Stop Diltiazem   .Your physician has requested that you have a lower or upper extremity arterial duplex. This test is an ultrasound of the arteries in the legs or arms. It looks at arterial blood flow in the legs and arms. Allow one hour for Lower and Upper Arterial scans. There are no restrictions or special instructions

## 2013-01-13 NOTE — Progress Notes (Signed)
PCP: Nadean Corwin, MD Referring Physician: Dr Graciela Husbands  Primary Cardiologist: Dr Lupita Dawn is a 67 y.o. male who presents today for routine electrophysiology followup.  Since last being seen in our clinic, the patient reports doing very well.  He has had no afib.  Today, he denies symptoms of palpitations, chest pain, shortness of breath,  lower extremity edema, dizziness, presyncope, or syncope.  He reports frequent calf pain with moderate activity.  The patient is otherwise without complaint today.   Past Medical History  Diagnosis Date  . Hypertension   . Persistent atrial fibrillation 07-11-11     Cardioverted x3  . Sleep apnea 07-11-11    Cpap used nightly most times  . Colon polyps 07-11-11    past hx. x 2 (benign)  . Arthritis 07-11-11    Osteoarthritis-hips, s/p LTHA, now rt. planned  . Hearing loss 07-11-11    Bilaterally  . Hyperlipidemia   . Mitral regurgitation    Past Surgical History  Procedure Laterality Date  . Joint replacement  07-11-11    LTHA  . Cataract extraction, bilateral  07-11-11    also Lasik surgery  . Tonsillectomy  07-11-11    child  . Cardioversion  07-11-11    x3 due to hx. A. Fib  . Total hip arthroplasty  07/25/2011    Procedure: TOTAL HIP ARTHROPLASTY ANTERIOR APPROACH;  Surgeon: Kathryne Hitch;  Location: [facility];  Service: Orthopedics;  Laterality: Right;  . Tee without cardioversion  07/26/2012    Procedure: TRANSESOPHAGEAL ECHOCARDIOGRAM (TEE);  Surgeon: Pricilla Riffle, MD;  Location: [facility] ENDOSCOPY;  Service: Cardiovascular;  Laterality: N/A;  . Ablation of dysrhythmic focus  07/28/2011    Afib ablation by Dr Johney Frame  . Cardioversion  08/06/2012    Procedure: CARDIOVERSION;  Surgeon: Pricilla Riffle, MD;  Location: [facility] ENDOSCOPY;  Service: Cardiovascular;  Laterality: N/A;    Current Outpatient Prescriptions  Medication Sig Dispense Refill  . bisoprolol-hydrochlorothiazide (ZIAC) 10-6.25 MG per tablet Take 1 tablet  by mouth daily.       . Cholecalciferol (VITAMIN D-3) 5000 UNITS TABS Take 1 tablet by mouth daily.       Marland Kitchen diltiazem (CARDIZEM CD) 120 MG 24 hr capsule Take 1 capsule (120 mg total) by mouth daily.      . Multiple Vitamin (MULITIVITAMIN WITH MINERALS) TABS Take 1 tablet by mouth daily. FOR MEN      . Rivaroxaban (XARELTO) 20 MG TABS Take 20 mg by mouth every evening.      . rosuvastatin (CRESTOR) 20 MG tablet Take 20 mg by mouth every evening.       Carlena Hurl 20 MG TABS TAKE ONE TABLET BY MOUTH EVERY DAY IN THE EVENING  30 tablet  5   No current facility-administered medications for this visit.    Physical Exam: Filed Vitals:   01/13/13 1049  BP: 112/65  Pulse: 50  Weight: 205 lb 12.8 oz (93.35 kg)    GEN- The patient is well appearing, alert and oriented x 3 today.   Head- normocephalic, atraumatic Eyes-  Sclera clear, conjunctiva pink Ears- hearing intact Oropharynx- clear Lungs- Clear to ausculation bilaterally, normal work of breathing Heart- Regular rate and rhythm, no murmurs, rubs or gallops, PMI not laterally displaced GI- soft, NT, ND, + BS Extremities- no clubbing, cyanosis, or edema  ekg today reveals sinus bradycardia 49 bpm, otherwise normal ekg  Assessment and Plan:  1. afib Doing well s/p ablation without  recurrence s/p ablation Stop diltiazem Continue xarelto  2. Calf pain Noninvasive dopplers/ ABIs  3. HTN Stable No change required today   Return in 6 months

## 2013-02-08 DIAGNOSIS — R0989 Other specified symptoms and signs involving the circulatory and respiratory systems: Secondary | ICD-10-CM

## 2013-02-08 IMAGING — CR DG HIP 1V PORT*R*
1 series · 1 of 1 positions shown · non-contrast
Comparison: 07/25/2011.

CLINICAL DATA: Postoperative radiograph after right hip
replacement.

PORTABLE RIGHT HIP - 1 VIEW

[AP]
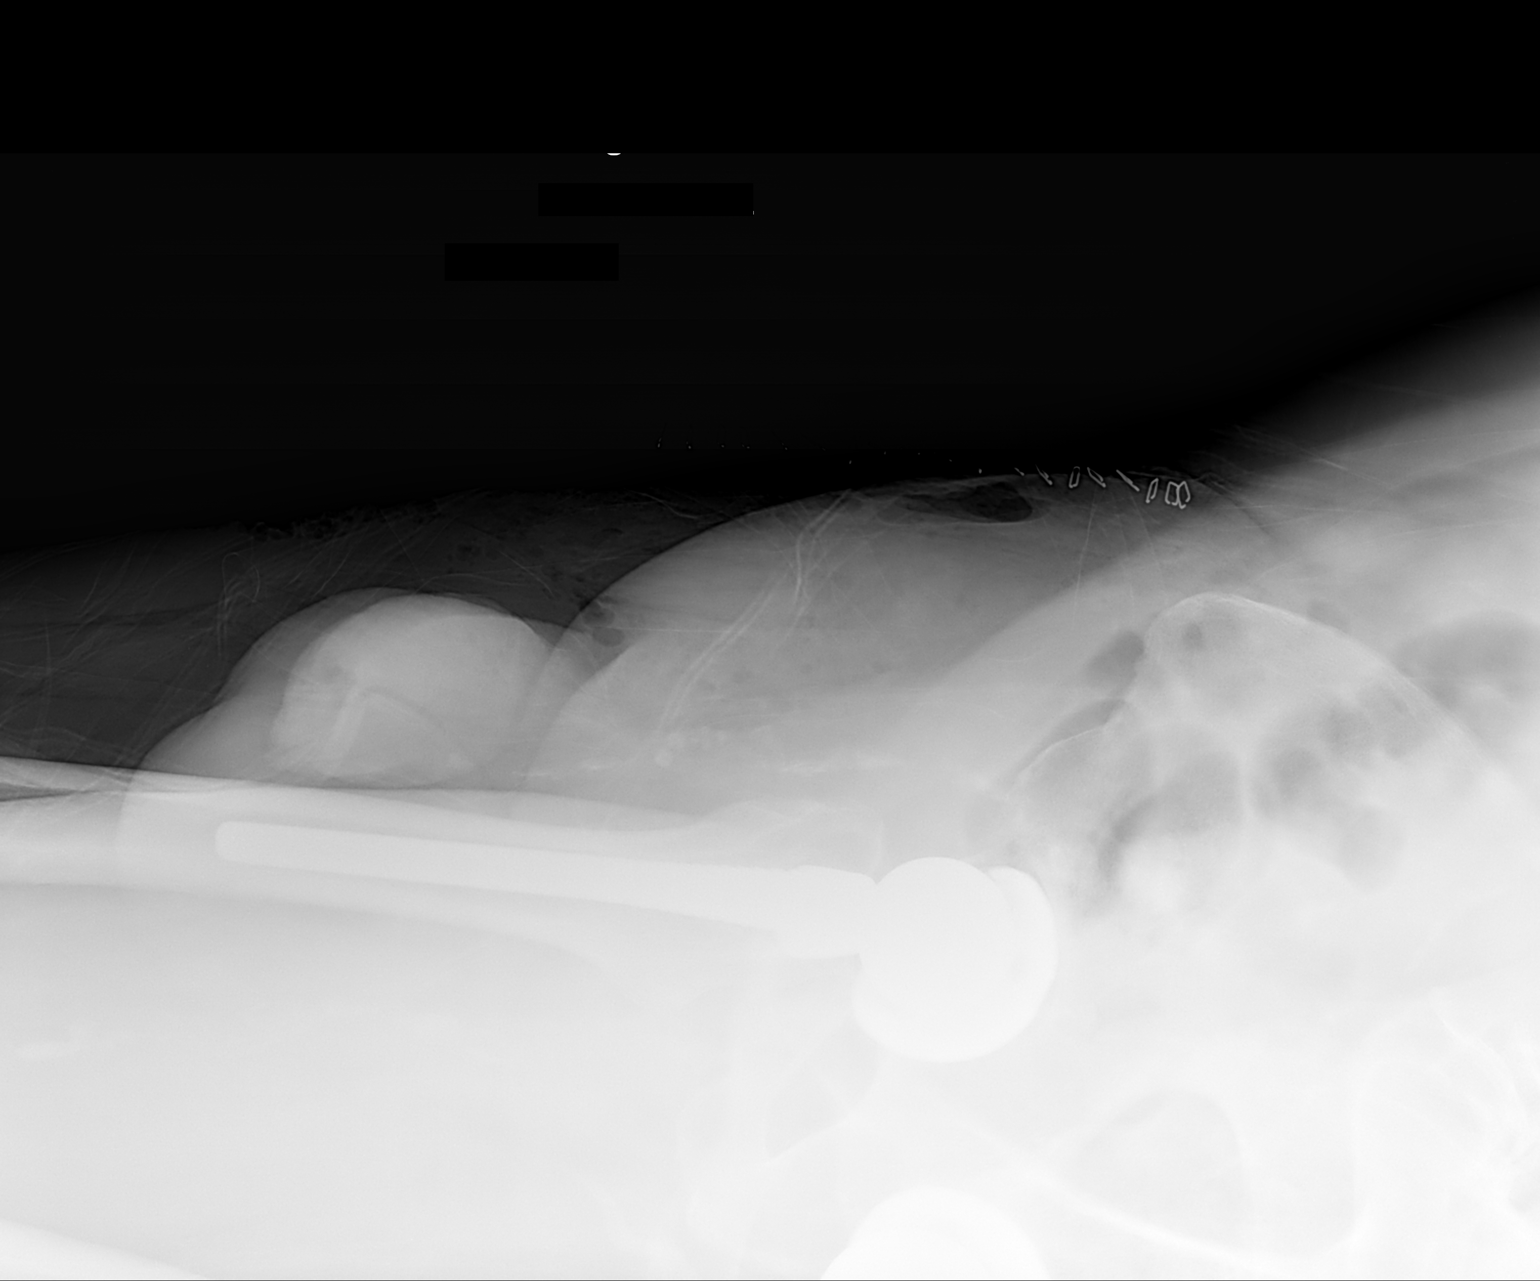

[1 of 1 positions shown; findings below may reference images not displayed]

FINDINGS: Lateral view the right hip demonstrates location of the
new right total hip arthroplasty.  Soft tissue overlap is present
along the proximal femur, degrading the study.  Expected
postoperative changes in the soft tissues.
IMPRESSION: No apparent complication of right total hip arthroplasty on lateral
view.

## 2013-02-08 IMAGING — CR DG PORTABLE PELVIS
1 series · 1 of 1 positions shown · non-contrast
Comparison: 02/08/2010

CLINICAL DATA: Postoperative for right hip prosthesis placement.

PORTABLE PELVIS

[AP]
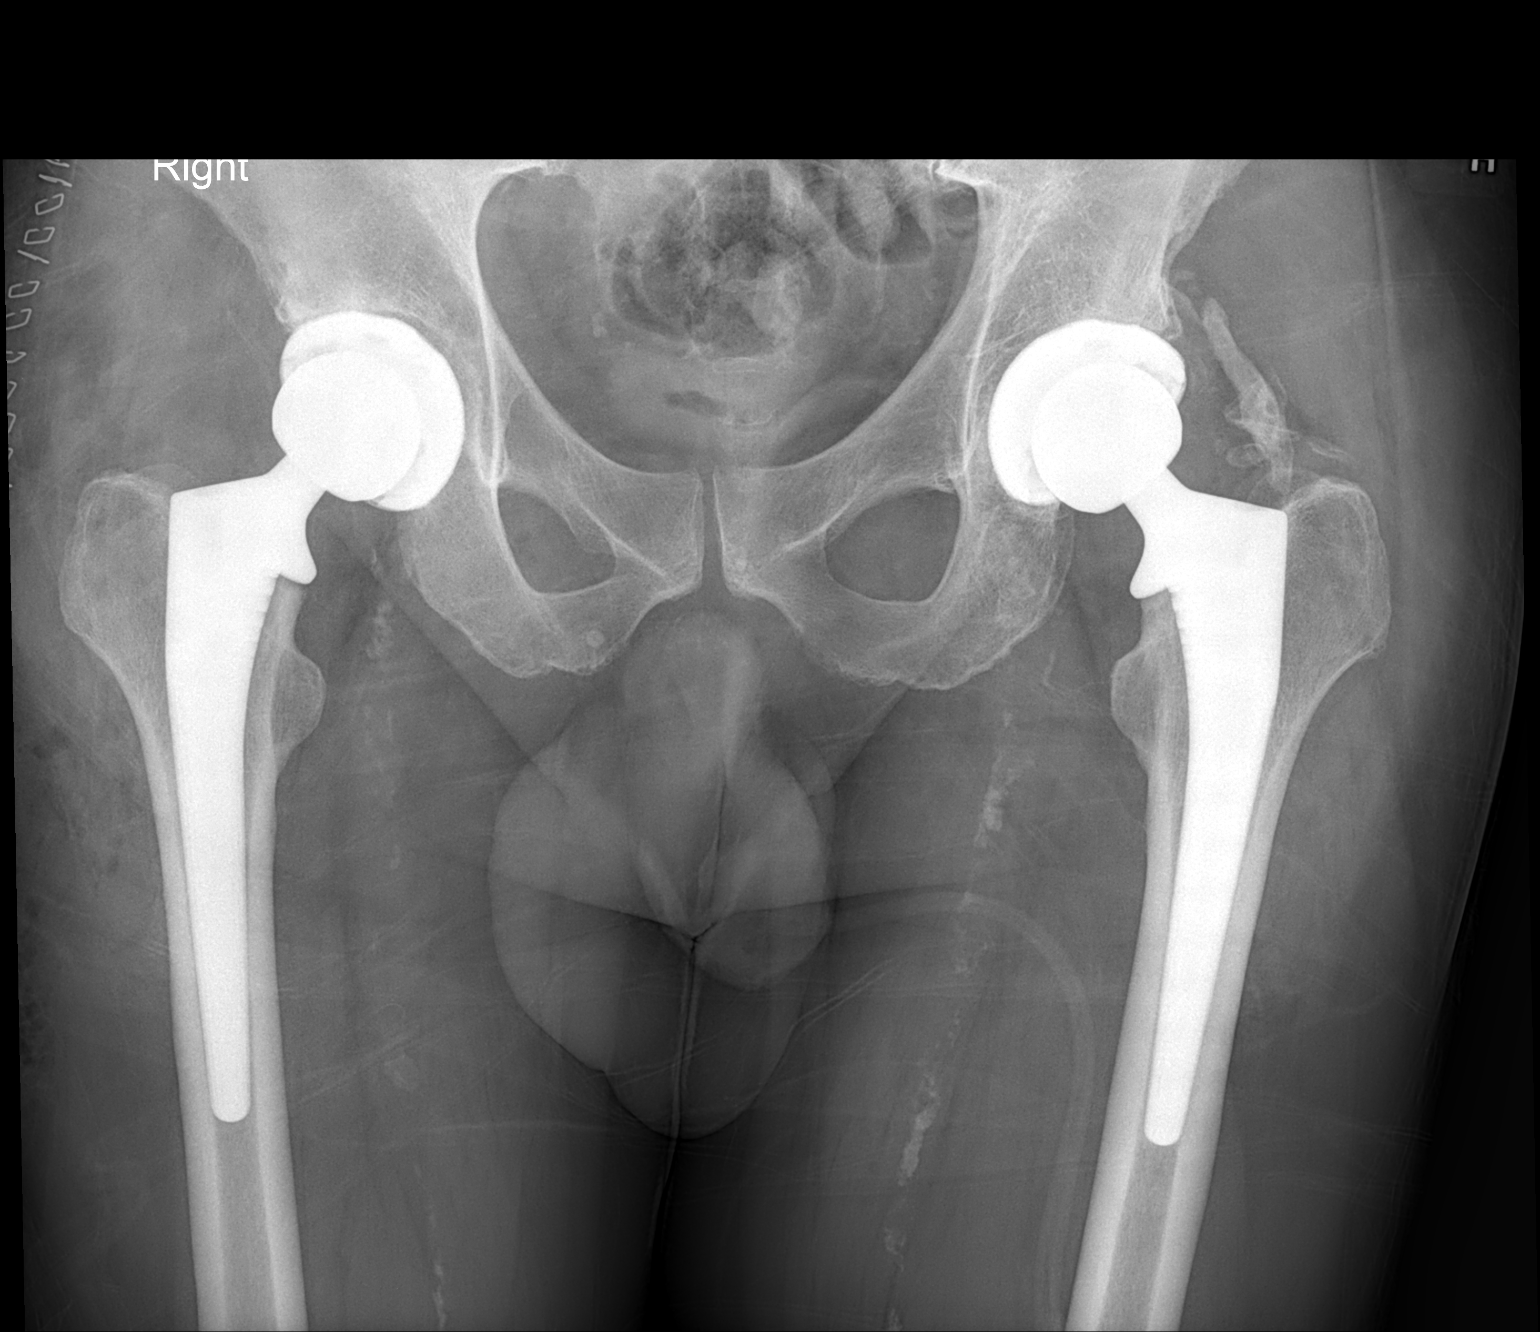

[1 of 1 positions shown; findings below may reference images not displayed]

FINDINGS: A right hip prosthesis is in place, without fracture,
dislocation, or other complicating feature.  Alignment and
positioning appear normal.  Skin clips are noted laterally; some of
the soft tissues of the right upper thigh region are excluded
laterally.

Arterial atherosclerosis noted.  Bands of heterotopic ossification
are present between the left greater trochanter in the lateral rim
of the left acetabulum.
IMPRESSION: 1.  Interval right hip prosthesis placement, without complicating
feature.

## 2013-02-08 IMAGING — RF DG HIP COMPLETE 2+V*R*
1 series · 2 of 2 positions shown · non-contrast
Comparison: None.

CLINICAL DATA: Intraoperative assessment, right hip replacement.

RIGHT HIP - COMPLETE 2+ VIEW

[Series 1: run · 2 of 2 slices shown]
[im 1/2]
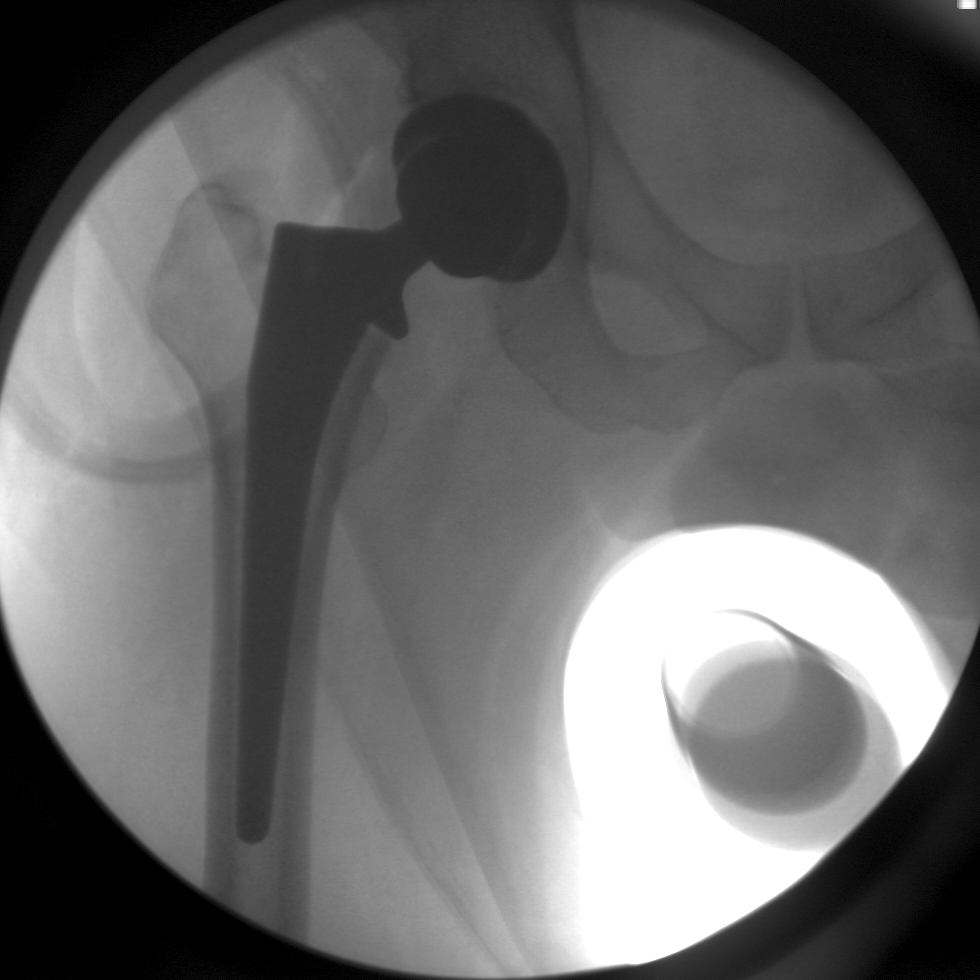
[im 2/2]
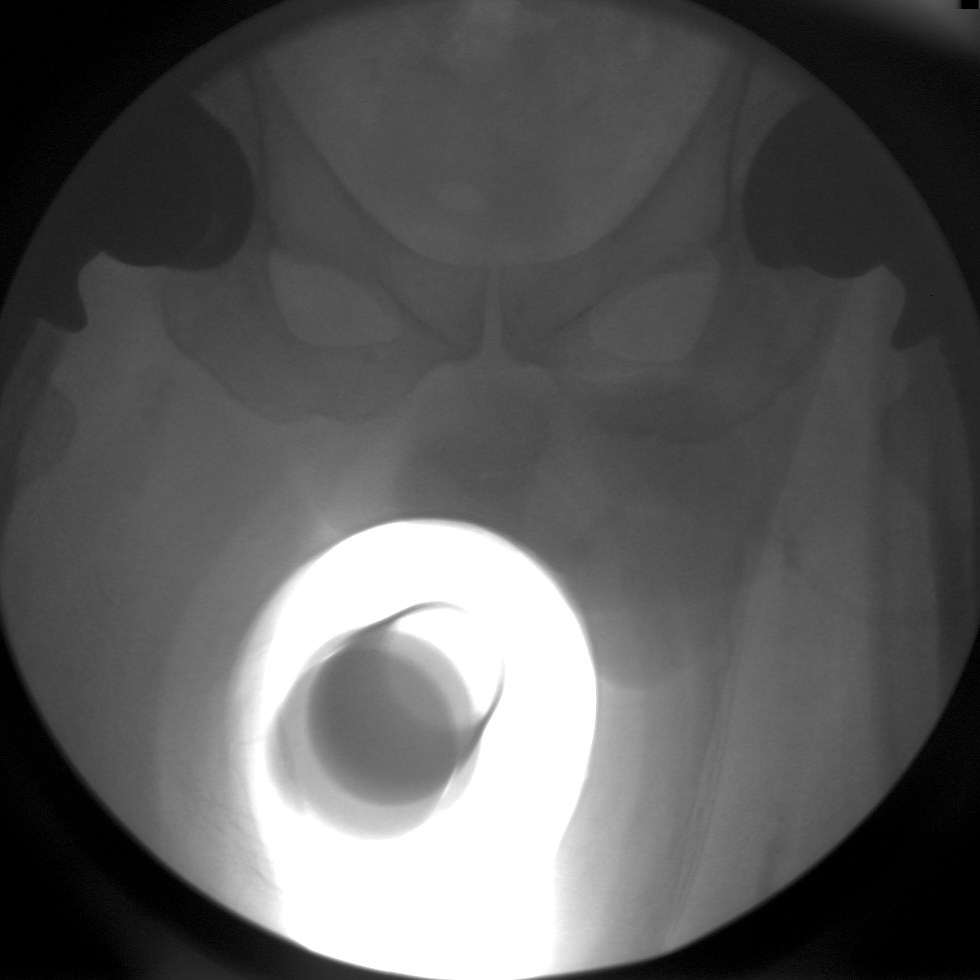

[2 of 2 positions shown; findings below may reference images not displayed]

FINDINGS: The first fluoroscopic spot image is centered on the
lower pelvis.  We visualize the medial aspect of the proximal
portions of the bilateral hip prosthesis.  The acetabular cup
orientation appears normal, and the pubic rami appear grossly
unremarkable.

The second image depicts the right hip from the frontal projection.
A right hip prosthesis is in place without fracture, dislocation,
or complicating feature.
IMPRESSION: 1.  Fluoroscopic spot images depict a right hip prosthesis without
complicating feature observed.

## 2013-02-15 ENCOUNTER — Encounter (INDEPENDENT_AMBULATORY_CARE_PROVIDER_SITE_OTHER): Payer: Medicare Other

## 2013-02-15 DIAGNOSIS — I739 Peripheral vascular disease, unspecified: Secondary | ICD-10-CM

## 2013-02-15 DIAGNOSIS — I10 Essential (primary) hypertension: Secondary | ICD-10-CM

## 2013-02-15 DIAGNOSIS — I4891 Unspecified atrial fibrillation: Secondary | ICD-10-CM

## 2013-02-15 DIAGNOSIS — M79662 Pain in left lower leg: Secondary | ICD-10-CM

## 2013-02-15 DIAGNOSIS — I70219 Atherosclerosis of native arteries of extremities with intermittent claudication, unspecified extremity: Secondary | ICD-10-CM

## 2013-03-16 ENCOUNTER — Other Ambulatory Visit: Payer: Self-pay | Admitting: Urology

## 2013-03-16 ENCOUNTER — Telehealth: Payer: Self-pay | Admitting: Internal Medicine

## 2013-03-16 NOTE — Telephone Encounter (Signed)
03-16-13 called pt to arrange appt with Kirke Corin , pt would like results of pv test in more detail before making an appt, dr Kirke Corin has appts open this Tuesday 03-22-13, [phone]

## 2013-03-16 NOTE — Telephone Encounter (Signed)
Spoke with patient and gave him the results of his dopplers and explained the need to see Dr Kirke Corin.  He is agreeable

## 2013-03-16 NOTE — Telephone Encounter (Signed)
New Problem  Pt states he received a VM saying that he needed to see Dr. Kirke Corin //request a call back for clarification of why the appt was needed.

## 2013-03-22 ENCOUNTER — Encounter: Payer: Self-pay | Admitting: Cardiovascular Disease

## 2013-03-22 ENCOUNTER — Ambulatory Visit (INDEPENDENT_AMBULATORY_CARE_PROVIDER_SITE_OTHER): Payer: Medicare Other | Admitting: Cardiovascular Disease

## 2013-03-22 VITALS — BP 120/78 | HR 57 | Ht 69.0 in | Wt 209.0 lb

## 2013-03-22 DIAGNOSIS — I739 Peripheral vascular disease, unspecified: Secondary | ICD-10-CM

## 2013-03-22 NOTE — Patient Instructions (Addendum)
Your physician wants you to follow-up in: 6 MONTHS with Dr Arida.  You will receive a reminder letter in the mail two months in advance. If you don't receive a letter, please call our office to schedule the follow-up appointment.  Your physician recommends that you continue on your current medications as directed. Please refer to the Current Medication list given to you today.  

## 2013-03-22 NOTE — Assessment & Plan Note (Signed)
The patient has evidence of underlying peripheral arterial disease with mild non-lifestyle limiting claudication. His ABI was normal on the right side moderately reduced on the left side with evidence of significant left SFA stenosis. Given that he is a nonsmoker and is not diabetic, I think the chance of progression is overall low. I suspect that his claudication will remain mild.  I discussed management options including medical therapy of risk factors versus proceeding with angiography and possible endovascular intervention. I recommend keeping the second option only for refractory symptoms. I advised him to start a walking program and regular basis. I will reevaluate his symptoms in 6 months. He is already on good medical therapy for hypertension and hyperlipidemia.

## 2013-03-22 NOTE — Progress Notes (Signed)
PCP: Nadean Corwin, MD  Damon [name] is a 67 y.o. male who was referred by Dr. Johney Frame for evaluation and management of peripheral arterial disease. He has known history of persistent atrial fibrillation status post ablation in January of 2014 with no recurrent episodes, hypertension and hyperlipidemia. He has known history of osteoarthritis status post replacement. He was noted on x-rays to have arterial calcifications and due to that he was referred for an ABI which was moderately reduced on the left side at 0.87 and normal on the right side. There was possible inflow disease in the left side with focal stenosis in the distal left SFA.  He reports bilateral calf discomfort only if he is going uphill at a fast pace. The discomfort is equal on both sides. He gets left leg discomfort that he bikes. The symptoms are overall mild and do not interfere with his activities of daily living.  Past Medical History  Diagnosis Date  . Hypertension   . Persistent atrial fibrillation 07-11-11     Cardioverted x3  . Sleep apnea 07-11-11    Cpap used nightly most times  . Colon polyps 07-11-11    past hx. x 2 (benign)  . Arthritis 07-11-11    Osteoarthritis-hips, s/p LTHA, now rt. planned  . Hearing loss 07-11-11    Bilaterally  . Hyperlipidemia   . Mitral regurgitation    Past Surgical History  Procedure Laterality Date  . Joint replacement  07-11-11    LTHA  . Cataract extraction, bilateral  07-11-11    also Lasik surgery  . Tonsillectomy  07-11-11    child  . Cardioversion  07-11-11    x3 due to hx. A. Fib  . Total hip arthroplasty  07/25/2011    Procedure: TOTAL HIP ARTHROPLASTY ANTERIOR APPROACH;  Surgeon: Kathryne Hitch;  Location: [facility];  Service: Orthopedics;  Laterality: Right;  . Tee without cardioversion  07/26/2012    Procedure: TRANSESOPHAGEAL ECHOCARDIOGRAM (TEE);  Surgeon: Pricilla Riffle, MD;  Location: [facility] ENDOSCOPY;  Service: Cardiovascular;  Laterality: N/A;  .  Ablation of dysrhythmic focus  07/28/2011    Afib ablation by Dr Johney Frame  . Cardioversion  08/06/2012    Procedure: CARDIOVERSION;  Surgeon: Pricilla Riffle, MD;  Location: [facility] ENDOSCOPY;  Service: Cardiovascular;  Laterality: N/A;    Current Outpatient Prescriptions  Medication Sig Dispense Refill  . bisoprolol-hydrochlorothiazide (ZIAC) 10-6.25 MG per tablet Take 1 tablet by mouth daily.       . Cholecalciferol (VITAMIN D-3) 5000 UNITS TABS Take 1 tablet by mouth daily.       . Multiple Vitamin (MULITIVITAMIN WITH MINERALS) TABS Take 1 tablet by mouth daily. FOR MEN      . Rivaroxaban (XARELTO) 20 MG TABS Take 20 mg by mouth every evening.      . rosuvastatin (CRESTOR) 20 MG tablet Take 20 mg by mouth every evening.        No current facility-administered medications for this visit.    Physical Exam: Filed Vitals:   03/22/13 0913  BP: 120/78  Pulse: 57  Height: 5\' 9"  (1.753 m)  Weight: 209 lb (94.802 kg)  SpO2: 96%   Constitutional: He is oriented to person, place, and time. He appears well-developed and well-nourished. No distress.  HENT: No nasal discharge.  Head: Normocephalic and atraumatic.  Eyes: Pupils are equal and round. Right eye exhibits no discharge. Left eye exhibits no discharge.  Neck: Normal range of motion. Neck supple. No JVD present. No thyromegaly  present.  Cardiovascular: Normal rate, regular rhythm, normal heart sounds and. Exam reveals no gallop and no friction rub. No murmur heard.  Pulmonary/Chest: Effort normal and breath sounds normal. No stridor. No respiratory distress. He has no wheezes. He has no rales. He exhibits no tenderness.  Abdominal: Soft. Bowel sounds are normal. He exhibits no distension. There is no tenderness. There is no rebound and no guarding.  Musculoskeletal: Normal range of motion. He exhibits no edema and no tenderness.  Neurological: He is alert and oriented to person, place, and time. Coordination normal.  Skin: Skin is warm and dry.  No rash noted. He is not diaphoretic. No erythema. No pallor.  Psychiatric: He has a normal mood and affect. His behavior is normal. Judgment and thought content normal.  Vascular: Femoral pulses are normal.  Radial pulses are normal.

## 2013-04-04 ENCOUNTER — Encounter (HOSPITAL_BASED_OUTPATIENT_CLINIC_OR_DEPARTMENT_OTHER): Payer: Self-pay | Admitting: *Deleted

## 2013-04-05 ENCOUNTER — Encounter (HOSPITAL_BASED_OUTPATIENT_CLINIC_OR_DEPARTMENT_OTHER): Payer: Self-pay | Admitting: *Deleted

## 2013-04-05 NOTE — Progress Notes (Signed)
NPO AFTER MN. ARRIVES AT 1045. NEEDS ISTAT 8. CURRENT EKG IN EPIC AND CHART.

## 2013-04-12 ENCOUNTER — Ambulatory Visit (HOSPITAL_COMMUNITY): Payer: Medicare Other

## 2013-04-12 ENCOUNTER — Ambulatory Visit (HOSPITAL_BASED_OUTPATIENT_CLINIC_OR_DEPARTMENT_OTHER)
Admission: RE | Admit: 2013-04-12 | Discharge: 2013-04-12 | Disposition: A | Discharge status: Home / Self Care | Payer: Medicare Other | Source: Ambulatory Visit | Attending: Urology | Admitting: Urology

## 2013-04-12 ENCOUNTER — Ambulatory Visit (HOSPITAL_BASED_OUTPATIENT_CLINIC_OR_DEPARTMENT_OTHER): Payer: Medicare Other | Admitting: Anesthesiology

## 2013-04-12 ENCOUNTER — Encounter (HOSPITAL_BASED_OUTPATIENT_CLINIC_OR_DEPARTMENT_OTHER): Payer: Self-pay | Admitting: Anesthesiology

## 2013-04-12 ENCOUNTER — Encounter (HOSPITAL_BASED_OUTPATIENT_CLINIC_OR_DEPARTMENT_OTHER): Payer: Self-pay

## 2013-04-12 ENCOUNTER — Encounter (HOSPITAL_BASED_OUTPATIENT_CLINIC_OR_DEPARTMENT_OTHER)
Admission: RE | Disposition: A | Discharge status: Home / Self Care | Payer: Self-pay | Source: Ambulatory Visit | Attending: Urology

## 2013-04-12 DIAGNOSIS — E785 Hyperlipidemia, unspecified: Secondary | ICD-10-CM | POA: Insufficient documentation

## 2013-04-12 DIAGNOSIS — Z7901 Long term (current) use of anticoagulants: Secondary | ICD-10-CM | POA: Insufficient documentation

## 2013-04-12 DIAGNOSIS — R310 Gross hematuria: Secondary | ICD-10-CM | POA: Insufficient documentation

## 2013-04-12 DIAGNOSIS — I10 Essential (primary) hypertension: Secondary | ICD-10-CM | POA: Insufficient documentation

## 2013-04-12 DIAGNOSIS — I4891 Unspecified atrial fibrillation: Secondary | ICD-10-CM | POA: Insufficient documentation

## 2013-04-12 DIAGNOSIS — C672 Malignant neoplasm of lateral wall of bladder: Secondary | ICD-10-CM | POA: Insufficient documentation

## 2013-04-12 DIAGNOSIS — Z87891 Personal history of nicotine dependence: Secondary | ICD-10-CM | POA: Insufficient documentation

## 2013-04-12 DIAGNOSIS — Z96649 Presence of unspecified artificial hip joint: Secondary | ICD-10-CM | POA: Insufficient documentation

## 2013-04-12 DIAGNOSIS — G4733 Obstructive sleep apnea (adult) (pediatric): Secondary | ICD-10-CM | POA: Insufficient documentation

## 2013-04-12 DIAGNOSIS — I739 Peripheral vascular disease, unspecified: Secondary | ICD-10-CM | POA: Insufficient documentation

## 2013-04-12 HISTORY — DX: Personal history of colon polyps, unspecified: Z86.0100

## 2013-04-12 HISTORY — DX: Paroxysmal atrial fibrillation: I48.0

## 2013-04-12 HISTORY — DX: Unspecified hearing loss, bilateral: H91.93

## 2013-04-12 HISTORY — DX: Peripheral vascular disease, unspecified: I73.9

## 2013-04-12 HISTORY — DX: Malignant neoplasm of bladder, unspecified: C67.9

## 2013-04-12 HISTORY — PX: CYSTOSCOPY W/ RETROGRADES: SHX1426

## 2013-04-12 HISTORY — DX: Obstructive sleep apnea (adult) (pediatric): G47.33

## 2013-04-12 HISTORY — DX: Dependence on other enabling machines and devices: Z99.89

## 2013-04-12 HISTORY — PX: TRANSURETHRAL RESECTION OF BLADDER TUMOR WITH GYRUS (TURBT-GYRUS): SHX6458

## 2013-04-12 HISTORY — DX: Personal history of colonic polyps: Z86.010

## 2013-04-12 LAB — POCT I-STAT, CHEM 8
Calcium, Ion: 1.02 mmol/L — ABNORMAL LOW (ref 1.13–1.30)
Chloride: 109 mEq/L (ref 96–112)
HCT: 44 % (ref 39.0–52.0)
Hemoglobin: 15 g/dL (ref 13.0–17.0)
Potassium: 4 mEq/L (ref 3.5–5.1)

## 2013-04-12 SURGERY — CYSTOSCOPY, WITH RETROGRADE PYELOGRAM
Anesthesia: General | Site: Ureter | Wound class: Clean Contaminated

## 2013-04-12 MED ORDER — GLYCOPYRROLATE 0.2 MG/ML IJ SOLN
INTRAMUSCULAR | Status: DC | PRN
  Administered 2013-04-12: 0.2 mg via INTRAVENOUS
  Administered 2013-04-12: 0.6 mg via INTRAVENOUS

## 2013-04-12 MED ORDER — ONDANSETRON HCL 4 MG/2ML IJ SOLN
INTRAMUSCULAR | Status: DC | PRN
  Administered 2013-04-12: 4 mg via INTRAVENOUS

## 2013-04-12 MED ORDER — SODIUM CHLORIDE 0.9 % IR SOLN
Status: DC | PRN
  Administered 2013-04-12: 6000 mL

## 2013-04-12 MED ORDER — OXYCODONE HCL 5 MG PO TABS
5.0000 mg | ORAL_TABLET | Freq: Once | ORAL | Status: AC | PRN
  Administered 2013-04-12: 5 mg via ORAL
  Filled 2013-04-12: qty 1

## 2013-04-12 MED ORDER — IOHEXOL 300 MG/ML  SOLN
INTRAMUSCULAR | Status: DC | PRN
  Administered 2013-04-12: 15 mL

## 2013-04-12 MED ORDER — LACTATED RINGERS IV SOLN
INTRAVENOUS | Status: DC
  Administered 2013-04-12 (×2): via INTRAVENOUS
  Filled 2013-04-12: qty 1000

## 2013-04-12 MED ORDER — PROMETHAZINE HCL 25 MG/ML IJ SOLN
6.2500 mg | INTRAMUSCULAR | Status: DC | PRN
  Filled 2013-04-12: qty 1

## 2013-04-12 MED ORDER — DEXAMETHASONE SODIUM PHOSPHATE 4 MG/ML IJ SOLN
INTRAMUSCULAR | Status: DC | PRN
  Administered 2013-04-12: 10 mg via INTRAVENOUS

## 2013-04-12 MED ORDER — OXYCODONE-ACETAMINOPHEN 5-325 MG PO TABS: 1.0000 | ORAL_TABLET | Freq: Four times a day (QID) | ORAL | Status: DC | PRN

## 2013-04-12 MED ORDER — FENTANYL CITRATE 0.05 MG/ML IJ SOLN
INTRAMUSCULAR | Status: DC | PRN
  Administered 2013-04-12: 100 ug via INTRAVENOUS

## 2013-04-12 MED ORDER — GENTAMICIN SULFATE 40 MG/ML IJ SOLN
400.0000 mg | INTRAVENOUS | Status: DC
  Filled 2013-04-12: qty 10

## 2013-04-12 MED ORDER — LIDOCAINE HCL (CARDIAC) 20 MG/ML IV SOLN
INTRAVENOUS | Status: DC | PRN
  Administered 2013-04-12: 80 mg via INTRAVENOUS

## 2013-04-12 MED ORDER — SENNOSIDES-DOCUSATE SODIUM 8.6-50 MG PO TABS: 1.0000 | ORAL_TABLET | Freq: Two times a day (BID) | ORAL | Status: DC

## 2013-04-12 MED ORDER — PROPOFOL 10 MG/ML IV BOLUS
INTRAVENOUS | Status: DC | PRN
  Administered 2013-04-12: 200 mg via INTRAVENOUS

## 2013-04-12 MED ORDER — NEOSTIGMINE METHYLSULFATE 1 MG/ML IJ SOLN
INTRAMUSCULAR | Status: DC | PRN
  Administered 2013-04-12: 2 mg via INTRAVENOUS
  Administered 2013-04-12: 3 mg via INTRAVENOUS

## 2013-04-12 MED ORDER — MEPERIDINE HCL 25 MG/ML IJ SOLN
6.2500 mg | INTRAMUSCULAR | Status: DC | PRN
  Filled 2013-04-12: qty 1

## 2013-04-12 MED ORDER — HYDROMORPHONE HCL PF 1 MG/ML IJ SOLN
0.2500 mg | INTRAMUSCULAR | Status: DC | PRN
  Filled 2013-04-12: qty 1

## 2013-04-12 MED ORDER — OXYCODONE HCL 5 MG/5ML PO SOLN
5.0000 mg | Freq: Once | ORAL | Status: AC | PRN
Start: 2013-04-12 — End: 2013-04-12
  Filled 2013-04-12: qty 5

## 2013-04-12 MED ORDER — GENTAMICIN IN SALINE 1.6-0.9 MG/ML-% IV SOLN
80.0000 mg | INTRAVENOUS | Status: DC
  Filled 2013-04-12: qty 50

## 2013-04-12 MED ORDER — ROCURONIUM BROMIDE 100 MG/10ML IV SOLN
INTRAVENOUS | Status: DC | PRN
  Administered 2013-04-12: 30 mg via INTRAVENOUS

## 2013-04-12 SURGICAL SUPPLY — 29 items
BAG URINE DRAINAGE (UROLOGICAL SUPPLIES) IMPLANT
BAG URINE LEG 19OZ MD ST LTX (BAG) IMPLANT
BAG URINE LEG 500ML (DRAIN) IMPLANT
BAG URO CATCHER STRL LF (DRAPE) ×3 IMPLANT
BASKET ZERO TIP NITINOL 2.4FR (BASKET) IMPLANT
CANISTER SUCT LVC 12 LTR MEDI- (MISCELLANEOUS) ×3 IMPLANT
CATH FOLEY 2WAY SLVR  5CC 22FR (CATHETERS)
CATH FOLEY 2WAY SLVR 30CC 20FR (CATHETERS) IMPLANT
CATH FOLEY 2WAY SLVR 5CC 22FR (CATHETERS) IMPLANT
CATH INTERMIT  6FR 70CM (CATHETERS) ×3 IMPLANT
CLOTH BEACON ORANGE TIMEOUT ST (SAFETY) ×3 IMPLANT
DRAPE CAMERA CLOSED 9X96 (DRAPES) ×3 IMPLANT
ELECT LOOP MED HF 24F 12D CBL (CLIP) ×3 IMPLANT
ELECT REM PT RETURN 9FT ADLT (ELECTROSURGICAL) ×3
ELECT RESECT VAPORIZE 12D CBL (ELECTRODE) IMPLANT
ELECTRODE REM PT RTRN 9FT ADLT (ELECTROSURGICAL) ×2 IMPLANT
EVACUATOR MICROVAS BLADDER (UROLOGICAL SUPPLIES) IMPLANT
GLOVE BIO SURGEON STRL SZ7.5 (GLOVE) ×3 IMPLANT
GLOVE BIOGEL PI IND STRL 7.5 (GLOVE) ×4 IMPLANT
GLOVE BIOGEL PI INDICATOR 7.5 (GLOVE) ×2
GOWN PREVENTION PLUS XLARGE (GOWN DISPOSABLE) IMPLANT
GOWN STRL NON-REIN LRG LVL3 (GOWN DISPOSABLE) ×6 IMPLANT
GUIDEWIRE ANG ZIPWIRE 038X150 (WIRE) ×3 IMPLANT
GUIDEWIRE STR DUAL SENSOR (WIRE) ×3 IMPLANT
IV NS IRRIG 3000ML ARTHROMATIC (IV SOLUTION) ×6 IMPLANT
KIT ASPIRATION TUBING (SET/KITS/TRAYS/PACK) ×3 IMPLANT
PACK CYSTOSCOPY (CUSTOM PROCEDURE TRAY) ×3 IMPLANT
SET ASPIRATION TUBING (TUBING) IMPLANT
SYRINGE IRR TOOMEY STRL 70CC (SYRINGE) IMPLANT

## 2013-04-12 NOTE — H&P (Signed)
Damon [name] is an 67 y.o. male.    Chief Complaint: Pre-op Transurethral Resection of Bladder Tumor  HPI:   1 - Bladder Cancer - Pt with left wall papillary tumor noted by Dr. Sherron Monday by office cysto on evaluation for gross hematuria. Remote 15PY smoker. 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  2 - Gross Hematuria - Pt with gross blood x several prompting GU eval 02/2013. Cysto wtih small bladder tumor. CT  without obious upper tract or locally advanced disease.   3 - Prostate Screening - No FHX prostate cancer 2014 - PSA 0.71  PMH sig for AFib (Xarelto for primary prevention), TNA, Bilateral Hip replacement. No MI/CVA.  Today Damon [name] is seen to proceed with tranurethral resection of bladder tumor. He stoppped Xarelto pre-op and denies interval fevers.  Past Medical History  Diagnosis Date  . Hyperlipidemia   . Mitral regurgitation   . Bladder cancer   . OSA on CPAP   . Hearing loss of both ears   . History of colon polyps     BENIGN  . PAD (peripheral artery disease)     ABI--  RIGHT SIDE NORMAL LEFT SIDE MODERATELY REDUCED W/ DISTAL LEFT SFA STENOSIS//  MILD CALDICATION  . S/P ablation of atrial fibrillation     07-27-2012  . Anticoagulant long-term use     XARELTO  . Hypertension   . PAF (paroxysmal atrial fibrillation) dx  2007 /   PREVIOUS CARDIOVERTED X3  LAST ONE 07-27-2012  POST  ABLATION  07-27-2012    PRIMARY CARDIOLOGIST-  DR Evon Slack CARDIO--  DR Johney Frame    Past Surgical History  Procedure Laterality Date  . Total hip arthroplasty  07/25/2011    Procedure: TOTAL HIP ARTHROPLASTY ANTERIOR APPROACH;  Surgeon: Kathryne Hitch;  Location: [facility] ORS;  Service: Orthopedics;  Laterality: Right;  . Tee without cardioversion  07/26/2012    Procedure: TRANSESOPHAGEAL ECHOCARDIOGRAM (TEE);  Surgeon: Pricilla Riffle, MD;  Location: [facility] ENDOSCOPY;  Service: Cardiovascular;  Laterality: N/A;  . Cardioversion  08/06/2012    Procedure: CARDIOVERSION;  Surgeon:  Pricilla Riffle, MD;  Location: [facility] ENDOSCOPY;  Service: Cardiovascular;  Laterality: N/A;  . Total hip arthroplasty Left 02-08-2010  . Cataract extraction w/ intraocular lens  implant, bilateral    . Cardiac electrophysiology study and ablation  07-27-2012  DR ALLRED    SUCCESSFUL ABLATION OF A-FIB  . Tonsillectomy  age 13    Family History  Problem Relation Age of Onset  . Heart attack Mother   . Heart failure Father    Social History:  reports that he quit smoking about 31 years ago. His smoking use included Cigarettes. He has a 20 pack-year smoking history. He has never used smokeless tobacco. He reports that he does not drink alcohol or use illicit drugs.  Allergies: No Known Allergies  No prescriptions prior to admission    No results found for this or any previous visit (from the past 48 hour(s)). No results found.  Review of Systems  Constitutional: Negative.  Negative for fever and chills.  HENT: Negative.   Eyes: Negative.   Respiratory: Negative.   Cardiovascular: Negative.   Gastrointestinal: Negative.  Negative for nausea and vomiting.  Genitourinary: Negative.  Negative for flank pain.  Musculoskeletal: Negative.   Skin: Negative.   Neurological: Negative.   Endo/Heme/Allergies: Negative.   Psychiatric/Behavioral: Negative.     Height 5\' 9"  (1.753 m), weight 94.802 kg (209 lb). Physical Exam  Constitutional: He is oriented  to person, place, and time. He appears well-developed and well-nourished.  HENT:  Head: Normocephalic and atraumatic.  Eyes: EOM are normal. Pupils are equal, round, and reactive to light.  Neck: Normal range of motion.  Cardiovascular: Normal rate and regular rhythm.   Respiratory: Effort normal.  GI: Soft. Bowel sounds are normal.  Genitourinary: Penis normal.  Musculoskeletal: Normal range of motion.  Neurological: He is alert and oriented to person, place, and time.  Skin: Skin is warm and dry.  Psychiatric: He has a normal mood and  affect. His behavior is normal. Judgment and thought content normal.     Assessment/Plan  1 - Bladder Cancer -  We rediscussed operative biopsy / transurethral resection as the best next step for diagnostic and therapeutic purposes with goals being to remove all visible cancer and obtain tissue for pathologic exam. We rediscussed that for some low-grade tumors, this may be all the treatment required, but that for many other tumors such as high-grade lesions, further therapy including surgery and or chemotherapy may be warranted. We also reoutlined the fact that any bladder cancer diagnosis will require close follow-up with periodic upper and lower tract evaluation. We reiscussed risks including bleeding, infection, damage to kidney / ureter / bladder including bladder perforation which can typically managed with prolonged foley catheterization. We rementioned anesthetic and other rare risks including DVT, PE, MI, and mortality. I also mentioned that adjunctive procedures such as ureteral stenting, retrograde pyelography, and ureteroscopy may be necessary to fully evaluate the urinary tract depending on intra-operative findings. After answering all questions to the patient's satisfaction, they wish to proceed.   Given left wall location I reiterated possibility of left ureteral stenting / needing to resect UO or near UO for goal of cancer resection. Proceed today as planned.   2 - Gross Hematuria - Eval with bladder cancer as per above.   3 - Prostate Screening - up to date this year, continue annual screening.  Damon [name] 04/12/2013, 8:16 AM

## 2013-04-12 NOTE — Transfer of Care (Signed)
Immediate Anesthesia Transfer of Care Note  Patient: Damon [name]  Procedure(s) Performed: Procedure(s): CYSTOSCOPY WITH RETROGRADE PYELOGRAM (Bilateral) TRANSURETHRAL RESECTION OF BLADDER TUMOR WITH GYRUS (TURBT-GYRUS) (N/A)  Patient Location: PACU  Anesthesia Type:General  Level of Consciousness: awake, alert  and oriented  Airway & Oxygen Therapy: Patient Spontanous Breathing and Patient connected to nasal cannula oxygen  Post-op Assessment: Report given to PACU RN  Post vital signs: Reviewed and stable  Complications: No apparent anesthesia complications

## 2013-04-12 NOTE — Anesthesia Procedure Notes (Signed)
Procedure Name: Intubation Date/Time: 04/12/2013 12:35 PM Performed by: Maris Berger T Pre-anesthesia Checklist: Patient identified, Emergency Drugs available, Suction available and Patient being monitored Patient Re-evaluated:Patient Re-evaluated prior to inductionOxygen Delivery Method: Circle System Utilized Preoxygenation: Pre-oxygenation with 100% oxygen Intubation Type: IV induction Ventilation: Mask ventilation without difficulty Laryngoscope Size: Mac and 4 Grade View: Grade I Tube type: Oral Tube size: 8.0 mm Number of attempts: 1 Airway Equipment and Method: stylet and oral airway Placement Confirmation: ETT inserted through vocal cords under direct vision,  positive ETCO2 and breath sounds checked- equal and bilateral Secured at: 22 cm Tube secured with: Tape Dental Injury: Teeth and Oropharynx as per pre-operative assessment

## 2013-04-12 NOTE — Anesthesia Preprocedure Evaluation (Addendum)
Anesthesia Evaluation  Patient identified by MRN, date of birth, ID band Patient awake    Reviewed: Allergy & Precautions, H&P , NPO status , Patient's Chart, lab work & pertinent test results, reviewed documented beta blocker date and time   Airway Mallampati: II TM Distance: >3 FB Neck ROM: Full    Dental  (+) Teeth Intact and Dental Advisory Given   Pulmonary sleep apnea and Continuous Positive Airway Pressure Ventilation , former smoker,  breath sounds clear to auscultation        Cardiovascular hypertension, Pt. on medications and Pt. on home beta blockers + Peripheral Vascular Disease + dysrhythmias Atrial Fibrillation Rhythm:Irregular Rate:Normal     Neuro/Psych negative neurological ROS     GI/Hepatic negative GI ROS, Neg liver ROS,   Endo/Other  negative endocrine ROS  Renal/GU negative Renal ROS     Musculoskeletal negative musculoskeletal ROS (+)   Abdominal Normal abdominal exam  (+) + obese,   Peds  Hematology negative hematology ROS (+)   Anesthesia Other Findings   Reproductive/Obstetrics                           Anesthesia Physical  Anesthesia Plan  ASA: III  Anesthesia Plan: General   Post-op Pain Management:    Induction: Intravenous  Airway Management Planned: LMA  Additional Equipment:   Intra-op Plan:   Post-operative Plan: Extubation in OR  Informed Consent: I have reviewed the patients History and Physical, chart, labs and discussed the procedure including the risks, benefits and alternatives for the proposed anesthesia with the patient or authorized representative who has indicated his/her understanding and acceptance.   Dental advisory given  Plan Discussed with: CRNA  Anesthesia Plan Comments:         Anesthesia Quick Evaluation

## 2013-04-12 NOTE — Brief Op Note (Signed)
04/12/2013  12:56 PM  PATIENT:  Damon [name]  67 y.o. male  PRE-OPERATIVE DIAGNOSIS:  BLADDER CANCER  POST-OPERATIVE DIAGNOSIS:  BLADDER CANCER  PROCEDURE:  Procedure(s): CYSTOSCOPY WITH RETROGRADE PYELOGRAM (Bilateral) TRANSURETHRAL RESECTION OF BLADDER TUMOR WITH GYRUS (TURBT-GYRUS) (N/A)  SURGEON:  Surgeon(s) and Role:    * Sebastian Ache, MD - Primary  PHYSICIAN ASSISTANT:   ASSISTANTS: none   ANESTHESIA:   general  EBL:     BLOOD ADMINISTERED:none  DRAINS: none   LOCAL MEDICATIONS USED:  NONE  SPECIMEN:  Source of Specimen:  1 - bladder tumor, 2 - base of bladder tumor  DISPOSITION OF SPECIMEN:  PATHOLOGY  COUNTS:  YES  TOURNIQUET:  * No tourniquets in log *  DICTATION: .Other Dictation: Dictation Number [id number]  PLAN OF CARE: Discharge to home after PACU  PATIENT DISPOSITION:  PACU - hemodynamically stable.   Delay start of Pharmacological VTE agent (>24hrs) due to surgical blood loss or risk of bleeding: yes

## 2013-04-12 NOTE — Anesthesia Postprocedure Evaluation (Signed)
  Anesthesia Post-op Note  Patient: Damon [name]  Procedure(s) Performed: Procedure(s) (LRB): CYSTOSCOPY WITH RETROGRADE PYELOGRAM (Bilateral) TRANSURETHRAL RESECTION OF BLADDER TUMOR WITH GYRUS (TURBT-GYRUS) (N/A)  Patient Location: PACU  Anesthesia Type: General  Level of Consciousness: awake and alert   Airway and Oxygen Therapy: Patient Spontanous Breathing  Post-op Pain: mild  Post-op Assessment: Post-op Vital signs reviewed, Patient's Cardiovascular Status Stable, Respiratory Function Stable, Patent Airway and No signs of Nausea or vomiting  Last Vitals:  Filed Vitals:   04/12/13 1510  BP: 126/66  Pulse: 46  Temp:   Resp: 14    Post-op Vital Signs: stable   Complications: No apparent anesthesia complications

## 2013-04-13 ENCOUNTER — Encounter (HOSPITAL_BASED_OUTPATIENT_CLINIC_OR_DEPARTMENT_OTHER): Payer: Self-pay | Admitting: Urology

## 2013-04-13 NOTE — Op Note (Deleted)
[name], [name]              ACCOUNT NO.:  [id number]  MEDICAL RECORD NO.:  [id number]  LOCATION:                                 FACILITY:  PHYSICIAN:  Sebastian Ache, MD          DATE OF BIRTH:  DATE OF PROCEDURE:  04/12/2013 DATE OF DISCHARGE:                              OPERATIVE REPORT   DIAGNOSIS:  Bladder cancer.  PROCEDURE: 1. Cystoscopy with bilateral retrograde pyelogram interpretation. 2. Transurethral resection of bladder tumor, volume medium.  COMPLICATIONS:  None.  SPECIMEN: 1. Bladder tumor. 2. Base of bladder tumor.  FINDINGS: 1. Papillary tumor approximately 4 cm diameter, approximately 3 cm     lateral to the left ureteral orifice. 2. Unremarkable bilateral retrograde pyelograms. 3. No evidence of bladder perforation following resection. 4. Embolus nil.  INDICATION:  Damon [name] is a very pleasant 67 year old gentleman, who on workup had gross hematuria, was found to have a papillary left wall bladder lesion consistent with carcinoma.  He had no other worrisome findings on his staging evaluation CT.  Options were discussed including transurethral resection for diagnostic and therapeutic purposes, and he wished to proceed.  Informed consent was obtained, placed in medical record.  DESCRIPTION OF PROCEDURE:  [name]. Procedure being transurethral resection of bladder tumor and retrograde pyelogram was confirmed.  The procedure was carried out.  Time-out was performed.  Intravenous antibiotics were administered.  General endotracheal anesthesia was introduced.  The patient was placed into a low lithotomy position.  Sterile field was created by prepping and draping the patient's penis, perineum, and proximal thighs using iodine x3.  Next, cystourethroscopy was performed using a 22-French rigid cystoscope with 12-degree offset lens.  Inspection of urinary bladder revealed a papillary tumor, approximately 4 cm in  diameter, 3 cm lateral to the left ureteral orifice.  There were no adjacent or additional papillary tumors upon endoscopy of his urinary bladder.  The right ureteral orifice was cannulated with a 6-French end-hole catheter and right retrograde pyelogram was obtained.  Right retrograde pyelogram demonstrated single right ureter, single system, right kidney.  No filling defects or narrowing noted. Similarly, the left ureteral orifice was cannulated and left retrograde pyelogram was obtained.  Left retrograde pyelogram demonstrated single left ureter, single system left kidney.  No filling defects or narrowing noted.  The cystoscope was then exchanged a 26-French ACMI continuous flow resectoscope with a medium-sized loop and using bipolar resection and saline.  Resection of the papillary tumor was taken down what appeared to be flushed in the urinary bladder wall.  These fragments were set aside labeled bladder tumor.  Next, additional deep swipes were taken including cold cup biopsy of the deep margin, set aside labeled base of bladder tumor. Repeat inspection revealed, no evidence of perforation.  Additional hemostasis was achieved with point coagulation.  The left ureteral orifice was uninjured and well away from the resection site and was seen to visibly expel urine following these maneuvers.  Bladder was emptied. Procedure was then terminated.  The patient tolerated the procedure well.  There were no immediate periprocedural complications.  The patient was taken to the postanesthesia care  unit in stable condition.          ______________________________ Sebastian Ache, MD     TM/MEDQ  D:  04/12/2013  T:  04/13/2013  Job:  [id number]

## 2013-04-13 NOTE — Op Note (Signed)
NAME:  [name], Damon              ACCOUNT NO.:  [id number]  MEDICAL RECORD NO.:  [id number]  LOCATION:                                 FACILITY:  PHYSICIAN:  Dot Splinter, MD          DATE OF BIRTH:  DATE OF PROCEDURE:  04/12/2013 DATE OF DISCHARGE:                              OPERATIVE REPORT   DIAGNOSIS:  Bladder cancer.  PROCEDURE: 1. Cystoscopy with bilateral retrograde pyelogram interpretation. 2. Transurethral resection of bladder tumor, volume medium.  COMPLICATIONS:  None.  SPECIMEN: 1. Bladder tumor. 2. Base of bladder tumor.  FINDINGS: 1. Papillary tumor approximately 4 cm diameter, approximately 3 cm     lateral to the left ureteral orifice. 2. Unremarkable bilateral retrograde pyelograms. 3. No evidence of bladder perforation following resection. 4. Embolus nil.  INDICATION:  Damon [name] is a very pleasant 66-year-old gentleman, who on workup had gross hematuria, was found to have a papillary left wall bladder lesion consistent with carcinoma.  He had no other worrisome findings on his staging evaluation CT.  Options were discussed including transurethral resection for diagnostic and therapeutic purposes, and he wished to proceed.  Informed consent was obtained, placed in medical record.  DESCRIPTION OF PROCEDURE:  [name]. Procedure being transurethral resection of bladder tumor and retrograde pyelogram was confirmed.  The procedure was carried out.  Time-out was performed.  Intravenous antibiotics were administered.  General endotracheal anesthesia was introduced.  The patient was placed into a low lithotomy position.  Sterile field was created by prepping and draping the patient's penis, perineum, and proximal thighs using iodine x3.  Next, cystourethroscopy was performed using a 22-French rigid cystoscope with 12-degree offset lens.  Inspection of urinary bladder revealed a papillary tumor, approximately 4 cm in  diameter, 3 cm lateral to the left ureteral orifice.  There were no adjacent or additional papillary tumors upon endoscopy of his urinary bladder.  The right ureteral orifice was cannulated with a 6-French end-hole catheter and right retrograde pyelogram was obtained.  Right retrograde pyelogram demonstrated single right ureter, single system, right kidney.  No filling defects or narrowing noted. Similarly, the left ureteral orifice was cannulated and left retrograde pyelogram was obtained.  Left retrograde pyelogram demonstrated single left ureter, single system left kidney.  No filling defects or narrowing noted.  The cystoscope was then exchanged a 26-French ACMI continuous flow resectoscope with a medium-sized loop and using bipolar resection and saline.  Resection of the papillary tumor was taken down what appeared to be flushed in the urinary bladder wall.  These fragments were set aside labeled bladder tumor.  Next, additional deep swipes were taken including cold cup biopsy of the deep margin, set aside labeled base of bladder tumor. Repeat inspection revealed, no evidence of perforation.  Additional hemostasis was achieved with point coagulation.  The left ureteral orifice was uninjured and well away from the resection site and was seen to visibly expel urine following these maneuvers.  Bladder was emptied. Procedure was then terminated.  The patient tolerated the procedure well.  There were no immediate periprocedural complications.  The patient was taken to the postanesthesia care   unit in stable condition.          ______________________________ Damon Downum, MD     TM/MEDQ  D:  04/12/2013  T:  04/13/2013  Job:  [id number] 

## 2013-06-14 ENCOUNTER — Other Ambulatory Visit: Payer: Self-pay | Admitting: Internal Medicine

## 2013-06-21 ENCOUNTER — Encounter: Payer: Self-pay | Admitting: Internal Medicine

## 2013-06-22 ENCOUNTER — Ambulatory Visit (INDEPENDENT_AMBULATORY_CARE_PROVIDER_SITE_OTHER): Payer: Medicare Other | Admitting: Internal Medicine

## 2013-06-22 ENCOUNTER — Encounter: Payer: Self-pay | Admitting: Internal Medicine

## 2013-06-22 VITALS — BP 126/80 | HR 64 | Temp 98.1°F | Resp 18 | Ht 68.75 in | Wt 212.6 lb

## 2013-06-22 DIAGNOSIS — E782 Mixed hyperlipidemia: Secondary | ICD-10-CM

## 2013-06-22 DIAGNOSIS — Z79899 Other long term (current) drug therapy: Secondary | ICD-10-CM

## 2013-06-22 DIAGNOSIS — R7309 Other abnormal glucose: Secondary | ICD-10-CM

## 2013-06-22 DIAGNOSIS — E559 Vitamin D deficiency, unspecified: Secondary | ICD-10-CM

## 2013-06-22 DIAGNOSIS — I10 Essential (primary) hypertension: Secondary | ICD-10-CM

## 2013-06-22 LAB — LIPID PANEL
HDL: 64 mg/dL (ref >39)
LDL Cholesterol: 94 mg/dL (ref 0–99)
Total CHOL/HDL Ratio: 2.7 Ratio
Triglycerides: 69 mg/dL (ref <150)
VLDL: 14 mg/dL (ref 0–40)

## 2013-06-22 LAB — BASIC METABOLIC PANEL WITH GFR
CO2: 29 mEq/L (ref 19–32)
Calcium: 9.5 mg/dL (ref 8.4–10.5)
Chloride: 104 mEq/L (ref 96–112)
Creat: 0.92 mg/dL (ref 0.50–1.35)
GFR, Est Non African American: 85 mL/min
Glucose, Bld: 81 mg/dL (ref 70–99)
Potassium: 3.9 mEq/L (ref 3.5–5.3)
Sodium: 141 mEq/L (ref 135–145)

## 2013-06-22 LAB — CBC WITH DIFFERENTIAL/PLATELET
Basophils Absolute: 0 10*3/uL (ref 0.0–0.1)
Eosinophils Absolute: 0.2 10*3/uL (ref 0.0–0.7)
Hemoglobin: 14.4 g/dL (ref 13.0–17.0)
Lymphocytes Relative: 29 % (ref 12–46)
Lymphs Abs: 2.1 10*3/uL (ref 0.7–4.0)
MCH: 29.9 pg (ref 26.0–34.0)
Monocytes Relative: 9 % (ref 3–12)
Neutrophils Relative %: 58 % (ref 43–77)
Platelets: 231 10*3/uL (ref 150–400)
RBC: 4.82 MIL/uL (ref 4.22–5.81)
WBC: 7.1 10*3/uL (ref 4.0–10.5)

## 2013-06-22 LAB — HEPATIC FUNCTION PANEL
ALT: 27 U/L (ref 0–53)
AST: 21 U/L (ref 0–37)
Albumin: 4.3 g/dL (ref 3.5–5.2)
Alkaline Phosphatase: 52 U/L (ref 39–117)
Bilirubin, Direct: 0.3 mg/dL (ref 0.0–0.3)
Indirect Bilirubin: 1 mg/dL — ABNORMAL HIGH (ref 0.0–0.9)

## 2013-06-22 LAB — TSH: TSH: 0.802 u[IU]/mL (ref 0.350–4.500)

## 2013-06-22 NOTE — Progress Notes (Signed)
Patient ID: Damon [name], male   DOB: 08-Aug-1945, 67 y.o.   MRN: [id number]   This very nice14 yo MWM presents for 3 month follow up with Hypertension, Hyperlipidemia, pre-diabetes and Vitamin D deficiency.    BP has been controlled at home. Today's BP is 126/80. Patient did undergo a cardiac ablation in January by Dr Johney Frame in January and has done well since wit rare transient/brief palpitations. Currently, he is continuing on Xarelto til f/u with Dr Johney Frame. Patient denies any cardiac type chest pain, dyspnea/orthopnea/PND, dizziness, claudication, or dependent edema.   Hyperlipidemia is controlled with diet & meds. Last cholesterol was  180, Triglycerides were 71, HDL 55, and LDL 81 - all at goal. Patient denies myalgias or other med SE's.    Also, the patient has history of prediabetes/insulin resistance with last A1c of 5.4% in June (5.7% in 2011). Patient denies any symptoms of reactive hypoglycemia, diabetic polys, paresthesias or visual blurring.   Further, Patient has history of vitamin D deficiency with last vitamin D of 82. Patient supplements vitamin D without any suspected side-effects.  Medication Sig Dispense Refill  . Ascorbic Acid (VITAMIN C) 1000 MG tablet Take 1,500 mg by mouth daily. States he takes aout 1000mg  per day      . bisoprolol-hydrochlorothiazide (ZIAC) 10-6.25 MG per tablet Take 1 tablet by mouth daily.       . cholecalciferol (VITAMIN D) 1000 UNITS tablet Take 10,000 Units by mouth daily.      . Multiple Vitamin (MULITIVITAMIN WITH MINERALS) TABS Take 1 tablet by mouth daily. FOR MEN      . rosuvastatin (CRESTOR) 20 MG tablet Take 20 mg by mouth every evening.       Carlena Hurl 20 MG TABS tablet TAKE ONE TABLET BY MOUTH IN THE EVENING  30 tablet  6     Allergies  Allergen Reactions  . Zetia [Ezetimibe] Other (See Comments)    myalgias    PMHx:   Past Medical History  Diagnosis Date  . Hyperlipidemia   . Mitral regurgitation   . Bladder cancer   . OSA  on CPAP   . Hearing loss of both ears   . History of colon polyps     BENIGN  . PAD (peripheral artery disease)     ABI--  RIGHT SIDE NORMAL LEFT SIDE MODERATELY REDUCED W/ DISTAL LEFT SFA STENOSIS//  MILD CALDICATION  . S/P ablation of atrial fibrillation     07-27-2012  . Anticoagulant long-term use     XARELTO  . Hypertension   . PAF (paroxysmal atrial fibrillation) dx  2007 /   PREVIOUS CARDIOVERTED X3  LAST ONE 07-27-2012  POST  ABLATION  07-27-2012    PRIMARY CARDIOLOGIST-  DR Evon Slack CARDIO--  DR Johney Frame  . Pre-diabetes   . Vitamin D deficiency     FHx:    Reviewed / unchanged  SHx:    Reviewed / unchanged  Systems Review: Constitutional: Denies fever, chills, wt changes, headaches, insomnia, fatigue, night sweats, change in appetite. Eyes: Denies redness, blurred vision, diplopia, discharge, itchy, watery eyes.  ENT: Denies discharge, congestion, post nasal drip, epistaxis, sore throat, earache, hearing loss, dental pain, tinnitus, vertigo, sinus pain, snoring.  CV: Denies chest pain, palpitations, irregular heartbeat, syncope, dyspnea, diaphoresis, orthopnea, PND, claudication, edema. Respiratory: denies cough, dyspnea, DOE, pleurisy, hoarseness, laryngitis, wheezing.  Gastrointestinal: Denies dysphagia, odynophagia, heartburn, reflux, water brash, abdominal pain or cramps, nausea, vomiting, bloating, diarrhea, constipation, hematemesis, melena, hematochezia,  or hemorrhoids. Genitourinary: Denies dysuria, frequency, urgency, nocturia, hesitancy, discharge, hematuria, flank pain. Musculoskeletal: Denies arthralgias, myalgias, stiffness, jt. swelling, pain, limp, strain/sprain.  Skin: Denies pruritus, rash, hives, warts, acne, eczema, change in skin lesion(s). Neuro: No weakness, tremor, incoordination, spasms, paresthesia, or pain. Psychiatric: Denies confusion, memory loss, or sensory loss. Endo: Denies change in weight, skin, hair change.  Heme/Lymph: No  excessive bleeding, bruising, orenlarged lymph nodes.  Filed Vitals:   06/22/13 1610  BP: 126/80  Pulse: 64  Temp: 98.1 F (36.7 C)  Resp: 18    Estimated body mass index is 31.63 kg/(m^2) as calculated from the following:   Height as of this encounter: 5' 8.75" (1.746 m).   Weight as of this encounter: 212 lb 9.6 oz (96.435 kg).  On Exam: Appears well nourished - in no distress. Eyes: PERRLA, EOMs, conjunctiva no swelling or erythema. Sinuses: No frontal/maxillary tenderness ENT/Mouth: EAC's clear, TM's nl w/o erythema, bulging. Nares clear w/o erythema, swelling, exudates. Oropharynx clear without erythema or exudates. Oral hygiene is good. Tongue normal, non obstructing. Hearing intact.  Neck: Supple. Thyroid nl. Car 2+/2+ without bruits, nodes or JVD. Chest: Respirations nl with BS clear & equal w/o rales, rhonchi, wheezing or stridor.  Cor: Heart sounds normal w/ regular rate and rhythm without sig. murmurs, gallops, clicks, or rubs. Peripheral pulses normal and equal  without edema.  Abdomen: Soft & bowel sounds normal. Non-tender w/o guarding, rebound, hernias, masses, or organomegaly.  Lymphatics: Unremarkable.  Musculoskeletal: Full ROM all peripheral extremities, joint stability, 5/5 strength, and normal gait.  Skin: Warm, dry without exposed rashes, lesions, ecchymosis apparent.  Neuro: Cranial nerves intact, reflexes equal bilaterally. Sensory-motor testing grossly intact. Tendon reflexes grossly intact.  Pysch: Alert & oriented x 3. Insight and judgement nl & appropriate. No ideations.  Assessment and Plan:  1. Hypertension - Continue monitor blood pressure at home. Continue diet/meds same.  2. Hyperlipidemia - Continue diet/meds, exercise,& lifestyle modifications. Continue monitor periodic cholesterol/liver & renal functions   3. Pre-diabetes - Continue diet, exercise, lifestyle modifications. Monitor appropriate labs.  4. Vitamin D Deficiency - Continue  supplementation  5. PA.fif- hx/o  Further disposition pending results of labs.

## 2013-06-22 NOTE — Patient Instructions (Signed)
Hypertension As your heart beats, it forces blood through your arteries. This force is your blood pressure. If the pressure is too high, it is called hypertension (HTN) or high blood pressure. HTN is dangerous because you may have it and not know it. High blood pressure may mean that your heart has to work harder to pump blood. Your arteries may be narrow or stiff. The extra work puts you at risk for heart disease, stroke, and other problems.  Blood pressure consists of two numbers, a higher number over a lower, 110/72, for example. It is stated as "110 over 72." The ideal is below 120 for the top number (systolic) and under 80 for the bottom (diastolic). Write down your blood pressure today. You should pay close attention to your blood pressure if you have certain conditions such as:  Heart failure.  Prior heart attack.  Diabetes  Chronic kidney disease.  Prior stroke.  Multiple risk factors for heart disease. To see if you have HTN, your blood pressure should be measured while you are seated with your arm held at the level of the heart. It should be measured at least twice. A one-time elevated blood pressure reading (especially in the Emergency Department) does not mean that you need treatment. There may be conditions in which the blood pressure is different between your right and left arms. It is important to see your caregiver soon for a recheck. Most people have essential hypertension which means that there is not a specific cause. This type of high blood pressure may be lowered by changing lifestyle factors such as:  Stress.  Smoking.  Lack of exercise.  Excessive weight.  Drug/tobacco/alcohol use.  Eating less salt. Most people do not have symptoms from high blood pressure until it has caused damage to the body. Effective treatment can often prevent, delay or reduce that damage. TREATMENT  When a cause has been identified, treatment for high blood pressure is directed at  the cause. There are a large number of medications to treat HTN. These fall into several categories, and your caregiver will help you select the medicines that are best for you. Medications may have side effects. You should review side effects with your caregiver. If your blood pressure stays high after you have made lifestyle changes or started on medicines,   Your medication(s) may need to be changed.  Other problems may need to be addressed.  Be certain you understand your prescriptions, and know how and when to take your medicine.  Be sure to follow up with your caregiver within the time frame advised (usually within two weeks) to have your blood pressure rechecked and to review your medications.  If you are taking more than one medicine to lower your blood pressure, make sure you know how and at what times they should be taken. Taking two medicines at the same time can result in blood pressure that is too low. SEEK IMMEDIATE MEDICAL CARE IF:  You develop a severe headache, blurred or changing vision, or confusion.  You have unusual weakness or numbness, or a faint feeling.  You have severe chest or abdominal pain, vomiting, or breathing problems. MAKE SURE YOU:   Understand these instructions.  Will watch your condition.  Will get help right away if you are not doing well or get worse. Document Released: 07/07/2005 Document Revised: 09/29/2011 Document Reviewed: 02/25/2008 [facility] Patient Information 2014 [place], [place]. Cholesterol Cholesterol is a white, waxy, fat-like protein needed by your body in small  amounts. The liver makes all the cholesterol you need. It is carried from the liver by the blood through the blood vessels. Deposits (plaque) may build up on blood vessel walls. This makes the arteries narrower and stiffer. Plaque increases the risk for heart attack and stroke. You cannot feel your cholesterol level even if it is very high. The only way to know is by a blood  test to check your lipid (fats) levels. Once you know your cholesterol levels, you should keep a record of the test results. Work with your caregiver to to keep your levels in the desired range. WHAT THE RESULTS MEAN:  Total cholesterol is a rough measure of all the cholesterol in your blood.  LDL is the so-called bad cholesterol. This is the type that deposits cholesterol in the walls of the arteries. You want this level to be low.  HDL is the good cholesterol because it cleans the arteries and carries the LDL away. You want this level to be high.  Triglycerides are fat that the body can either burn for energy or store. High levels are closely linked to heart disease. DESIRED LEVELS:  Total cholesterol below 200.  LDL below 100 for people at risk, below 70 for very high risk.  HDL above 50 is good, above 60 is best.  Triglycerides below 150. HOW TO LOWER YOUR CHOLESTEROL:  Diet.  Choose fish or white meat chicken and [place], roasted or baked. Limit fatty cuts of red meat, fried foods, and processed meats, such as sausage and lunch meat.  Eat lots of fresh fruits and vegetables. Choose whole grains, beans, pasta, potatoes and cereals.  Use only small amounts of olive, corn or canola oils. Avoid butter, mayonnaise, shortening or palm kernel oils. Avoid foods with trans-fats.  Use skim/nonfat milk and low-fat/nonfat yogurt and cheeses. Avoid whole milk, cream, ice cream, egg yolks and cheeses. Healthy desserts include angel food cake, ginger snaps, animal crackers, hard candy, popsicles, and low-fat/nonfat frozen yogurt. Avoid pastries, cakes, pies and cookies.  Exercise.  A regular program helps decrease LDL and raises HDL.  Helps with weight control.  Do things that increase your activity level like gardening, walking, or taking the stairs.  Medication.  May be prescribed by your caregiver to help lowering cholesterol and the risk for heart disease.  You may need medicine  even if your levels are normal if you have several risk factors. HOME CARE INSTRUCTIONS   Follow your diet and exercise programs as suggested by your caregiver.  Take medications as directed.  Have blood work done when your caregiver feels it is necessary. MAKE SURE YOU:   Understand these instructions.  Will watch your condition.  Will get help right away if you are not doing well or get worse. Document Released: 04/01/2001 Document Revised: 09/29/2011 Document Reviewed: 09/22/2007 [facility] Patient Information 2014 [place], [place]. Vitamin D Deficiency Vitamin D is an important vitamin that your body needs. Having too little of it in your body is called a deficiency. A very bad deficiency can make your bones soft and can cause a condition called rickets.  Vitamin D is important to your body for different reasons, such as:   It helps your body absorb 2 minerals called calcium and phosphorus.  It helps make your bones healthy.  It may prevent some diseases, such as diabetes and multiple sclerosis.  It helps your muscles and heart. You can get vitamin D in several ways. It is a natural part of some foods.  The vitamin is also added to some dairy products and cereals. Some people take vitamin D supplements. Also, your body makes vitamin D when you are in the sun. It changes the sun's rays into a form of the vitamin that your body can use. CAUSES   Not eating enough foods that contain vitamin D.  Not getting enough sunlight.  Having certain digestive system diseases that make it hard to absorb vitamin D. These diseases include Crohn's disease, chronic pancreatitis, and cystic fibrosis.  Having a surgery in which part of the stomach or small intestine is removed.  Being obese. Fat cells pull vitamin D out of your blood. That means that obese people may not have enough vitamin D left in their blood and in other body tissues.  Having chronic kidney or liver disease. RISK  FACTORS Risk factors are things that make you more likely to develop a vitamin D deficiency. They include:  Being older.  Not being able to get outside very much.  Living in a nursing home.  Having had broken bones.  Having weak or thin bones (osteoporosis).  Having a disease or condition that changes how your body absorbs vitamin D.  Having dark skin.  Some medicines such as seizure medicines or steroids.  Being overweight or obese. SYMPTOMS Mild cases of vitamin D deficiency may not have any symptoms. If you have a very bad case, symptoms may include:  Bone pain.  Muscle pain.  Falling often.  Broken bones caused by a minor injury, due to osteoporosis. DIAGNOSIS A blood test is the best way to tell if you have a vitamin D deficiency. TREATMENT Vitamin D deficiency can be treated in different ways. Treatment for vitamin D deficiency depends on what is causing it. Options include:  Taking vitamin D supplements.  Taking a calcium supplement. Your caregiver will suggest what dose is best for you. HOME CARE INSTRUCTIONS  Take any supplements that your caregiver prescribes. Follow the directions carefully. Take only the suggested amount.  Have your blood tested 2 months after you start taking supplements.  Eat foods that contain vitamin D. Healthy choices include:  Fortified dairy products, cereals, or juices. Fortified means vitamin D has been added to the food. Check the label on the package to be sure.  Fatty fish like salmon or trout.  Eggs.  Oysters.  Do not use a tanning bed.  Keep your weight at a healthy level. Lose weight if you need to.  Keep all follow-up appointments. Your caregiver will need to perform blood tests to make sure your vitamin D deficiency is going away. SEEK MEDICAL CARE IF:  You have any questions about your treatment.  You continue to have symptoms of vitamin D deficiency.  You have nausea or vomiting.  You are  constipated.  You feel confused.  You have severe abdominal or back pain. MAKE SURE YOU:  Understand these instructions.  Will watch your condition.  Will get help right away if you are not doing well or get worse. Document Released: 09/29/2011 Document Revised: 11/01/2012 Document Reviewed: 09/29/2011 [facility] Patient Information 2014 [place], [place].

## 2013-06-23 LAB — HEMOGLOBIN A1C: Hgb A1c MFr Bld: 5.6 % (ref <5.7)

## 2013-07-05 ENCOUNTER — Ambulatory Visit: Payer: Self-pay | Admitting: Internal Medicine

## 2013-07-20 ENCOUNTER — Encounter: Payer: Self-pay | Admitting: Internal Medicine

## 2013-08-17 ENCOUNTER — Ambulatory Visit: Payer: Medicare Other | Admitting: Internal Medicine

## 2013-09-12 ENCOUNTER — Ambulatory Visit (INDEPENDENT_AMBULATORY_CARE_PROVIDER_SITE_OTHER): Payer: Medicare Other | Admitting: Internal Medicine

## 2013-09-12 ENCOUNTER — Encounter: Payer: Self-pay | Admitting: Internal Medicine

## 2013-09-12 VITALS — BP 138/80 | HR 62 | Ht 68.75 in | Wt 216.0 lb

## 2013-09-12 DIAGNOSIS — I10 Essential (primary) hypertension: Secondary | ICD-10-CM

## 2013-09-12 DIAGNOSIS — I4891 Unspecified atrial fibrillation: Secondary | ICD-10-CM

## 2013-09-12 NOTE — Patient Instructions (Signed)
Your physician wants you to follow-up in: 12 months with Dr Allred You will receive a reminder letter in the mail two months in advance. If you don't receive a letter, please call our office to schedule the follow-up appointment.  

## 2013-09-12 NOTE — Progress Notes (Signed)
PCP: [name], MD Referring Physician: Dr Caryl Comes  [name] is a 68 y.o. male who presents today for routine electrophysiology followup.  Since last being seen in our clinic, the patient reports doing very well.  He has had no afib.  Today, he denies symptoms of palpitations, chest pain, shortness of breath,  lower extremity edema, dizziness, presyncope, or syncope.  He reports frequent calf pain with moderate activity.  The patient is otherwise without complaint today.   Past Medical History  Diagnosis Date  . Hyperlipidemia   . Mitral regurgitation   . Bladder cancer   . OSA on CPAP   . Hearing loss of both ears   . History of colon polyps     BENIGN  . PAD (peripheral artery disease)     ABI--  RIGHT SIDE NORMAL LEFT SIDE MODERATELY REDUCED W/ DISTAL LEFT SFA STENOSIS//  MILD CALDICATION  . S/P ablation of atrial fibrillation     07-27-2012  . Anticoagulant long-term use     XARELTO  . Hypertension   . PAF (paroxysmal atrial fibrillation) dx  2007 /   PREVIOUS CARDIOVERTED X3  LAST ONE 07-27-2012  POST  ABLATION  07-27-2012    PRIMARY CARDIOLOGIST-  DR [name] CARDIO--  DR [name]  . Pre-diabetes   . Vitamin D deficiency    Past Surgical History  Procedure Laterality Date  . Total hip arthroplasty  07/25/2011    Procedure: TOTAL HIP ARTHROPLASTY ANTERIOR APPROACH;  Surgeon: Mcarthur Rossetti;  Location: [facility];  Service: Orthopedics;  Laterality: Right;  . Tee without cardioversion  07/26/2012    Procedure: TRANSESOPHAGEAL ECHOCARDIOGRAM (TEE);  Surgeon: [name], MD;  Location: [facility];  Service: Cardiovascular;  Laterality: N/A;  . Cardioversion  08/06/2012    Procedure: CARDIOVERSION;  Surgeon: [name], MD;  Location: [place];  Service: Cardiovascular;  Laterality: N/A;  . Total hip arthroplasty Left 02-08-2010  . Cataract extraction w/ intraocular lens  implant, bilateral    . Cardiac  electrophysiology study and ablation  07-27-2012  DR Arby Dahir    SUCCESSFUL ABLATION OF A-FIB  . Tonsillectomy  age 31  . Cystoscopy w/ retrogrades Bilateral 04/12/2013    Procedure: CYSTOSCOPY WITH RETROGRADE PYELOGRAM;  Surgeon: [name], MD;  Location: [facility];  Service: Urology;  Laterality: Bilateral;  . Transurethral resection of bladder tumor with gyrus (turbt-gyrus) N/A 04/12/2013    Procedure: TRANSURETHRAL RESECTION OF BLADDER TUMOR WITH GYRUS (TURBT-GYRUS);  Surgeon: [name], MD;  Location: [facility];  Service: Urology;  Laterality: N/A;    Current Outpatient Prescriptions  Medication Sig Dispense Refill  . Ascorbic Acid (VITAMIN C) 1000 MG tablet Take 1,500 mg by mouth daily. States he takes aout 1000mg  per day      . bisoprolol-hydrochlorothiazide (ZIAC) 10-6.25 MG per tablet Take 1 tablet by mouth daily.       . cholecalciferol (VITAMIN D) 1000 UNITS tablet Take 10,000 Units by mouth daily.      Marland Kitchen ketorolac (ACULAR) 0.4 % SOLN Place 1 drop into both eyes 3 (three) times daily.      . Multiple Vitamin (MULITIVITAMIN WITH MINERALS) TABS Take 1 tablet by mouth daily. FOR MEN      . rosuvastatin (CRESTOR) 20 MG tablet Take 20 mg by mouth every evening.       [name] tablet TAKE ONE TABLET BY MOUTH IN THE EVENING  30 tablet  6   No current facility-administered medications for this visit.    Physical Exam: Filed Vitals:   09/12/13 1538  BP: 138/80  Pulse: 62  Height: 5' 8.75" (1.746 m)  Weight: 216 lb (97.977 kg)    GEN- The patient is well appearing, alert and oriented x 3 today.   Head- normocephalic, atraumatic Eyes-  Sclera clear, conjunctiva pink Ears- hearing intact Oropharynx- clear Lungs- Clear to ausculation bilaterally, normal work of breathing Heart- Regular rate and rhythm, no murmurs, rubs or gallops, PMI not laterally displaced GI- soft, NT, ND, + BS Extremities- no clubbing, cyanosis, or  edema  ekg today reveals sinus bradycardia 49 bpm, otherwise normal ekg  Assessment and Plan:  1. afib Doing well s/p ablation without recurrence s/p ablation off of AAD therapy His CHADS2VASC score is 2.  He would like to stop anticoagulation.  Guidelines would suggest that he should continue anticoagulaton long term.  As his stroke risk are relatively low, he may still want to stop xarelto.  He is willing to continue xarelto at this time but will let me know if he stops it going forward.  2.  HTN Stable No change required today   Return in 12 months

## 2013-09-22 ENCOUNTER — Ambulatory Visit (INDEPENDENT_AMBULATORY_CARE_PROVIDER_SITE_OTHER): Payer: Medicare Other | Admitting: Emergency Medicine

## 2013-09-22 ENCOUNTER — Encounter: Payer: Self-pay | Admitting: Emergency Medicine

## 2013-09-22 VITALS — BP 116/64 | HR 64 | Temp 98.2°F | Resp 18 | Wt 214.0 lb

## 2013-09-22 DIAGNOSIS — I10 Essential (primary) hypertension: Secondary | ICD-10-CM

## 2013-09-22 DIAGNOSIS — R5383 Other fatigue: Secondary | ICD-10-CM

## 2013-09-22 DIAGNOSIS — E782 Mixed hyperlipidemia: Secondary | ICD-10-CM

## 2013-09-22 DIAGNOSIS — R109 Unspecified abdominal pain: Secondary | ICD-10-CM

## 2013-09-22 DIAGNOSIS — Z23 Encounter for immunization: Secondary | ICD-10-CM

## 2013-09-22 DIAGNOSIS — R5381 Other malaise: Secondary | ICD-10-CM

## 2013-09-22 DIAGNOSIS — R7309 Other abnormal glucose: Secondary | ICD-10-CM

## 2013-09-22 LAB — CBC WITH DIFFERENTIAL/PLATELET
Basophils Absolute: 0 10*3/uL (ref 0.0–0.1)
Basophils Relative: 0 % (ref 0–1)
EOS ABS: 0.3 10*3/uL (ref 0.0–0.7)
EOS PCT: 4 % (ref 0–5)
HCT: 45.6 % (ref 39.0–52.0)
HEMOGLOBIN: 16.1 g/dL (ref 13.0–17.0)
Lymphocytes Relative: 25 % (ref 12–46)
Lymphs Abs: 1.8 10*3/uL (ref 0.7–4.0)
MCH: 30 pg (ref 26.0–34.0)
MCHC: 35.3 g/dL (ref 30.0–36.0)
MCV: 84.9 fL (ref 78.0–100.0)
MONOS PCT: 11 % (ref 3–12)
Monocytes Absolute: 0.8 10*3/uL (ref 0.1–1.0)
Neutro Abs: 4.3 10*3/uL (ref 1.7–7.7)
Neutrophils Relative %: 60 % (ref 43–77)
PLATELETS: 225 10*3/uL (ref 150–400)
RBC: 5.37 MIL/uL (ref 4.22–5.81)
RDW: 13.6 % (ref 11.5–15.5)
WBC: 7.2 10*3/uL (ref 4.0–10.5)

## 2013-09-22 LAB — HEMOGLOBIN A1C
Hgb A1c MFr Bld: 5.7 % — ABNORMAL HIGH (ref <5.7)
Mean Plasma Glucose: 117 mg/dL — ABNORMAL HIGH (ref <117)

## 2013-09-22 LAB — HEPATIC FUNCTION PANEL
ALT: 22 U/L (ref 0–53)
AST: 18 U/L (ref 0–37)
Albumin: 4.8 g/dL (ref 3.5–5.2)
Alkaline Phosphatase: 43 U/L (ref 39–117)
BILIRUBIN DIRECT: 0.3 mg/dL (ref 0.0–0.3)
Indirect Bilirubin: 1.5 mg/dL — ABNORMAL HIGH (ref 0.2–1.2)
Total Bilirubin: 1.8 mg/dL — ABNORMAL HIGH (ref 0.2–1.2)
Total Protein: 6.8 g/dL (ref 6.0–8.3)

## 2013-09-22 LAB — BASIC METABOLIC PANEL WITH GFR
BUN: 16 mg/dL (ref 6–23)
CO2: 28 mEq/L (ref 19–32)
Calcium: 9.8 mg/dL (ref 8.4–10.5)
Chloride: 103 mEq/L (ref 96–112)
Creat: 0.9 mg/dL (ref 0.50–1.35)
GFR, Est African American: >89 mL/min
GFR, Est Non African American: 87 mL/min
Glucose, Bld: 96 mg/dL (ref 70–99)
Potassium: 4.1 mEq/L (ref 3.5–5.3)
Sodium: 139 mEq/L (ref 135–145)

## 2013-09-22 LAB — LIPID PANEL
CHOLESTEROL: 153 mg/dL (ref 0–200)
HDL: 58 mg/dL (ref >39)
LDL Cholesterol: 83 mg/dL (ref 0–99)
TRIGLYCERIDES: 61 mg/dL (ref <150)
Total CHOL/HDL Ratio: 2.6 Ratio
VLDL: 12 mg/dL (ref 0–40)

## 2013-09-22 NOTE — Progress Notes (Signed)
Subjective:    Patient ID: Damon [name], male    DOB: 12-09-1945, 68 y.o.   MRN: [id number]  HPI Comments: 68 yo male presents for 3 month F/U for HTN, Cholesterol, Pre-Dm, D. Deficient. He has not been exercising as much with weather. He has not been eating as well with cold weather. He notes BP has been good.  LAST LABS CHOL         172   06/22/2013 HDL           64   06/22/2013 LDLCALC       94   06/22/2013 TRIG          69   06/22/2013 CHOLHDL      2.7   06/22/2013 ALT           27   06/22/2013 AST           21   06/22/2013 ALKPHOS       52   06/22/2013 BILITOT      1.3   06/22/2013 CREATININE     0.92   06/22/2013 BUN              13   06/22/2013 NA              141   06/22/2013 K               3.9   06/22/2013 CL              104   06/22/2013 CO2              29   06/22/2013 HGBA1C      5.6   06/22/2013 VIT D 77 MAG 1.9 Insulin 6  He notes mild allergy drainage x 43month. He denies any OTC.   He has noted increased low abdomen pain/ fullness mostly with sitting. He has evaluation by urology (Dr Tresa Moore) 9/ 2014. His colonoscopy 12/2009 and is due to repeat 2016. He denies any urine/ BM changes/ difficulties. He notes CT abd/pelvis was undiagnostic due to artifact from hip replacements.  Hyperlipidemia  Hypertension    Current Outpatient Prescriptions on File Prior to Visit  Medication Sig Dispense Refill  . Ascorbic Acid (VITAMIN C) 1000 MG tablet Take 1,500 mg by mouth daily. States he takes aout 1000mg  per day      . bisoprolol-hydrochlorothiazide (ZIAC) 10-6.25 MG per tablet Take 1 tablet by mouth daily.       . cholecalciferol (VITAMIN D) 1000 UNITS tablet Take 10,000 Units by mouth daily.      . Multiple Vitamin (MULITIVITAMIN WITH MINERALS) TABS Take 1 tablet by mouth daily. FOR MEN      . rosuvastatin (CRESTOR) 20 MG tablet Take 20 mg by mouth every evening.       [name] tablet TAKE ONE TABLET BY MOUTH IN THE EVENING  30 tablet  6   No current  facility-administered medications on file prior to visit.   Allergies  Allergen Reactions  . Zetia [Ezetimibe] Other (See Comments)    myalgias   Past Medical History  Diagnosis Date  . Hyperlipidemia   . Mitral regurgitation   . Bladder cancer   . OSA on CPAP   . Hearing loss of both ears   . History of colon polyps     BENIGN  . PAD (peripheral artery disease)     ABI--  RIGHT SIDE NORMAL LEFT SIDE MODERATELY REDUCED W/ DISTAL LEFT  SFA STENOSIS//  MILD CALDICATION  . S/P ablation of atrial fibrillation     07-27-2012  . Anticoagulant long-term use     XARELTO  . Hypertension   . PAF (paroxysmal atrial fibrillation) dx  2007 /   PREVIOUS CARDIOVERTED X3  LAST ONE 07-27-2012  POST  ABLATION  07-27-2012    PRIMARY CARDIOLOGIST-  DR [name] CARDIO--  DR [name]  . Pre-diabetes   . Vitamin D deficiency       Review of Systems  Constitutional: Positive for fatigue.  Gastrointestinal: Positive for abdominal pain.  All other systems reviewed and are negative.   BP 116/64  Pulse 64  Temp(Src) 98.2 F (36.8 C) (Temporal)  Resp 18  Wt 214 lb (97.07 kg)     Objective:   Physical Exam  Nursing note and vitals reviewed. Constitutional: He is oriented to person, place, and time. He appears well-developed and well-nourished.  HENT:  Head: Normocephalic and atraumatic.  Right Ear: External ear normal.  Left Ear: External ear normal.  Nose: Nose normal.  Mouth/Throat: No oropharyngeal exudate.  Cobblestones posterior pharynx   Eyes: Conjunctivae and EOM are normal.  Neck: Normal range of motion. Neck supple. No JVD present. No thyromegaly present.  Cardiovascular: Normal rate, regular rhythm, normal heart sounds and intact distal pulses.   Pulmonary/Chest: Effort normal and breath sounds normal.  Abdominal: Soft. Bowel sounds are normal. He exhibits no distension and no mass. There is no tenderness. There is no rebound and no guarding.  Musculoskeletal: Normal  range of motion. He exhibits no edema and no tenderness.  Lymphadenopathy:    He has no cervical adenopathy.  Neurological: He is alert and oriented to person, place, and time. He has normal reflexes. No cranial nerve deficit. Coordination normal.  Skin: Skin is warm and dry.  Psychiatric: He has a normal mood and affect. His behavior is normal. Judgment and thought content normal.      NEG Depression/ Fall screen    Assessment & Plan:  1.  3 month F/U for HTN, Cholesterol, Pre-Dm, D. Deficient. Needs healthy diet, cardio QD and obtain healthy weight. Check Labs, Check BP if >130/80 call office 2. Occasional Fatigue- check labs, increase activity and H2O 3. Low abdomen pain/ fullness- Request CT from Urology, consider pelvis ultrasound with artifact on prior CT.  4. Allergic rhinitis- Allegra OTC, increase H2o, allergy hygiene explained.

## 2013-09-22 NOTE — Patient Instructions (Signed)
PRETetanus, Diphtheria, Pertussis (Tdap) Vaccine What You Need to Know WHY GET VACCINATED? Tetanus, diphtheria and pertussis can be very serious diseases, even for adolescents and adults. Tdap vaccine can protect [place] from these diseases. TETANUS (Lockjaw) causes painful muscle tightening and stiffness, usually all over the body.  It can lead to tightening of muscles in the head and neck so you can't open your mouth, swallow, or sometimes even breathe. Tetanus kills about 1 out of 5 people who are infected. DIPHTHERIA can cause a thick coating to form in the back of the throat.  It can lead to breathing problems, paralysis, heart failure, and death. PERTUSSIS (Whooping Cough) causes severe coughing spells, which can cause difficulty breathing, vomiting and disturbed sleep.  It can also lead to weight loss, incontinence, and rib fractures. Up to 2 in 100 adolescents and 5 in 100 adults with pertussis are hospitalized or have complications, which could include pneumonia and death. These diseases are caused by bacteria. Diphtheria and pertussis are spread from person to person through coughing or sneezing. Tetanus enters the body through cuts, scratches, or wounds. Before vaccines, the [place] saw as many as 200,000 cases a year of diphtheria and pertussis, and hundreds of cases of tetanus. Since vaccination began, tetanus and diphtheria have dropped by about 99% and pertussis by about 80%. TDAP VACCINE Tdap vaccine can protect adolescents and adults from tetanus, diphtheria, and pertussis. One dose of Tdap is routinely given at age 71 or 61. People who did not get Tdap at that age should get it as soon as possible. Tdap is especially important for health care professionals and anyone having close contact with a baby younger than 12 months. Pregnant women should get a dose of Tdap during every pregnancy, to protect the newborn from pertussis. Infants are most at risk for severe, life-threatening  complications from pertussis. A similar vaccine, called Td, protects from tetanus and diphtheria, but not pertussis. A Td booster should be given every 10 years. Tdap may be given as one of these boosters if you have not already gotten a dose. Tdap may also be given after a severe cut or burn to prevent tetanus infection. Your doctor can give you more information. Tdap may safely be given at the same time as other vaccines. SOME PEOPLE SHOULD NOT GET THIS VACCINE  If you ever had a life-threatening allergic reaction after a dose of any tetanus, diphtheria, or pertussis containing vaccine, OR if you have a severe allergy to any part of this vaccine, you should not get Tdap. Tell your doctor if you have any severe allergies.  If you had a coma, or long or multiple seizures within 7 days after a childhood dose of DTP or DTaP, you should not get Tdap, unless a cause other than the vaccine was found. You can still get Td.  Talk to your doctor if you:  have epilepsy or another nervous system problem,  had severe pain or swelling after any vaccine containing diphtheria, tetanus or pertussis,  ever had Guillain-Barr Syndrome (GBS),  aren't feeling well on the day the shot is scheduled. RISKS OF A VACCINE REACTION With any medicine, including vaccines, there is a chance of side effects. These are usually mild and go away on their own, but serious reactions are also possible. Brief fainting spells can follow a vaccination, leading to injuries from falling. Sitting or lying down for about 15 minutes can help prevent these. Tell your doctor if you feel dizzy or light-headed, or  have vision changes or ringing in the ears. Mild problems following Tdap (Did not interfere with activities)  Pain where the shot was given (about 3 in 4 adolescents or 2 in 3 adults)  Redness or swelling where the shot was given (about 1 person in 5)  Mild fever of at least 100.26F (up to about 1 in 25 adolescents or 1 in  100 adults)  Headache (about 3 or 4 people in 10)  Tiredness (about 1 person in 3 or 4)  Nausea, vomiting, diarrhea, stomach ache (up to 1 in 4 adolescents or 1 in 10 adults)  Chills, body aches, sore joints, rash, swollen glands (uncommon) Moderate problems following Tdap (Interfered with activities, but did not require medical attention)  Pain where the shot was given (about 1 in 5 adolescents or 1 in 100 adults)  Redness or swelling where the shot was given (up to about 1 in 16 adolescents or 1 in 25 adults)  Fever over 102F (about 1 in 100 adolescents or 1 in 250 adults)  Headache (about 3 in 20 adolescents or 1 in 10 adults)  Nausea, vomiting, diarrhea, stomach ache (up to 1 or 3 people in 100)  Swelling of the entire arm where the shot was given (up to about 3 in 100). Severe problems following Tdap (Unable to perform usual activities, required medical attention)  Swelling, severe pain, bleeding and redness in the arm where the shot was given (rare). A severe allergic reaction could occur after any vaccine (estimated less than 1 in a million doses). WHAT IF THERE IS A SERIOUS REACTION? What should I look for?  Look for anything that concerns you, such as signs of a severe allergic reaction, very high fever, or behavior changes. Signs of a severe allergic reaction can include hives, swelling of the face and throat, difficulty breathing, a fast heartbeat, dizziness, and weakness. These would start a few minutes to a few hours after the vaccination. What should I do?  If you think it is a severe allergic reaction or other emergency that can't wait, call 9-1-1 or get the person to the nearest hospital. Otherwise, call your doctor.  Afterward, the reaction should be reported to the "Vaccine Adverse Event Reporting System" (VAERS). Your doctor might file this report, or you can do it yourself through the VAERS web site at www.vaers.LAgents.no, or by calling 1-831-488-6284. VAERS is  only for reporting reactions. They do not give medical advice.  THE NATIONAL VACCINE INJURY COMPENSATION PROGRAM The National Vaccine Injury Compensation Program (VICP) is a federal program that was created to compensate people who may have been injured by certain vaccines. Persons who believe they may have been injured by a vaccine can learn about the program and about filing a claim by calling 1-217-618-1616 or visiting the VICP website at SpiritualWord.at. HOW CAN I LEARN MORE?  Ask your doctor.  Call your local or state health department.  Contact the Centers for Disease Control and Prevention (CDC):  Call 321-143-1952 or visit CDC's website at PicCapture.uy. CDC Tdap Vaccine VIS (11/27/11) Document Released: 01/06/2012 Document Revised: 11/01/2012 Document Reviewed: 10/27/2012 [facility] Patient Information 2014 [place], [place]. Diabetes and Exercise Exercising regularly is important. It is not just about losing weight. It has many health benefits, such as:  Improving your overall fitness, flexibility, and endurance.  Increasing your bone density.  Helping with weight control.  Decreasing your body fat.  Increasing your muscle strength.  Reducing stress and tension.  Improving your overall health. People with  diabetes who exercise gain additional benefits because exercise:  Reduces appetite.  Improves the body's use of blood sugar (glucose).  Helps lower or control blood glucose.  Decreases blood pressure.  Helps control blood lipids (such as cholesterol and triglycerides).  Improves the body's use of the hormone insulin by:  Increasing the body's insulin sensitivity.  Reducing the body's insulin needs.  Decreases the risk for heart disease because exercising:  Lowers cholesterol and triglycerides levels.  Increases the levels of good cholesterol (such as high-density lipoproteins [HDL]) in the body.  Lowers blood glucose  levels. YOUR ACTIVITY PLAN  Choose an activity that you enjoy and set realistic goals. Your health care provider or diabetes educator can help you make an activity plan that works for you. You can break activities into 2 or 3 sessions throughout the day. Doing so is as good as one long session. Exercise ideas include:  Taking the dog for a walk.  Taking the stairs instead of the elevator.  Dancing to your favorite song.  Doing your favorite exercise with a friend. RECOMMENDATIONS FOR EXERCISING WITH TYPE 1 OR TYPE 2 DIABETES   Check your blood glucose before exercising. If blood glucose levels are greater than 240 mg/dL, check for urine ketones. Do not exercise if ketones are present.  Avoid injecting insulin into areas of the body that are going to be exercised. For example, avoid injecting insulin into:  The arms when playing tennis.  The legs when jogging.  Keep a record of:  Food intake before and after you exercise.  Expected peak times of insulin action.  Blood glucose levels before and after you exercise.  The type and amount of exercise you have done.  Review your records with your health care provider. Your health care provider will help you to develop guidelines for adjusting food intake and insulin amounts before and after exercising.  If you take insulin or oral hypoglycemic agents, watch for signs and symptoms of hypoglycemia. They include:  Dizziness.  Shaking.  Sweating.  Chills.  Confusion.  Drink plenty of water while you exercise to prevent dehydration or heat stroke. Body water is lost during exercise and must be replaced.  Talk to your health care provider before starting an exercise program to make sure it is safe for you. Remember, almost any type of activity is better than none. Document Released: 09/27/2003 Document Revised: 03/09/2013 Document Reviewed: 12/14/2012 [facility] Patient Information 2014 [place]. Adhesions  Adhesions are  strings of scar tissue that can make the insides of your belly (abdomen) stick together. Belly adhesions can twist your intestines. This can block the passage of food or cause pain. Adhesions can be caused by surgery, infection, or radiation treatment. The most common places where adhesions form are the belly and pelvis. HOME CARE  Only take medicine as told by your doctor.  Pay attention to the pain:  Has it changed?  Has it moved?  Is it gone?  Eat or drink as told by your doctor. Ask your doctor if you should be on a special diet. GET HELP RIGHT AWAY IF:  Your pain is not gone in 24 hours.  Your pain is worse, changes location, or feels different.  You have a fever.  You cannot stop throwing up (vomiting).  You have blood or brown flecks (like coffee grounds) in your throw up.  You have blood in your poop (stool).  Your poop is dark or black.  You cannot poop (constipation) or pass gas.  MAKE SURE YOU:  Understand these instructions.  Will watch your condition.  Will get help right away if you are not doing well or get worse. Document Released: 10/03/2008 Document Revised: 09/29/2011 Document Reviewed: 04/27/2009 [facility] Patient Information 2014 [place], [place].

## 2013-09-29 ENCOUNTER — Telehealth: Payer: Self-pay | Admitting: Internal Medicine

## 2013-09-29 NOTE — Telephone Encounter (Signed)
PATIENT CALLED TO SEE STATUS OF THE LOWER ABD CT SCAN THAT WAS TO BE ORDERED AFTER HIS RECENT APPT.  PLEASE ADVISE PT.

## 2013-09-30 ENCOUNTER — Other Ambulatory Visit: Payer: Self-pay | Admitting: Emergency Medicine

## 2013-09-30 ENCOUNTER — Ambulatory Visit
Admission: RE | Admit: 2013-09-30 | Discharge: 2013-09-30 | Disposition: A | Discharge status: Home / Self Care | Payer: Medicare Other | Source: Ambulatory Visit | Attending: Emergency Medicine | Admitting: Emergency Medicine

## 2013-09-30 DIAGNOSIS — R918 Other nonspecific abnormal finding of lung field: Secondary | ICD-10-CM

## 2013-09-30 DIAGNOSIS — R109 Unspecified abdominal pain: Secondary | ICD-10-CM

## 2013-10-03 ENCOUNTER — Other Ambulatory Visit: Payer: Self-pay | Admitting: Emergency Medicine

## 2013-10-03 ENCOUNTER — Telehealth: Payer: Self-pay | Admitting: *Deleted

## 2013-10-03 DIAGNOSIS — R109 Unspecified abdominal pain: Secondary | ICD-10-CM

## 2013-10-03 NOTE — Telephone Encounter (Signed)
PT IS STILL HAVING THE ISSUE ASKING TO GO AHEAD & SEND REF TO " GI DR "    ALSO ASKING ABOUT INFO FOUND ON CXR PLEASE CALL PT & CLARIFY THE RESULTS

## 2013-10-14 ENCOUNTER — Ambulatory Visit
Admission: RE | Admit: 2013-10-14 | Discharge: 2013-10-14 | Disposition: A | Discharge status: Home / Self Care | Payer: Medicare Other | Source: Ambulatory Visit | Attending: Emergency Medicine | Admitting: Emergency Medicine

## 2013-10-14 ENCOUNTER — Other Ambulatory Visit: Payer: Medicare Other

## 2013-10-14 DIAGNOSIS — R918 Other nonspecific abnormal finding of lung field: Secondary | ICD-10-CM

## 2013-10-17 ENCOUNTER — Encounter: Payer: Self-pay | Admitting: Internal Medicine

## 2013-10-17 ENCOUNTER — Other Ambulatory Visit: Payer: Self-pay | Admitting: Emergency Medicine

## 2013-10-17 DIAGNOSIS — R918 Other nonspecific abnormal finding of lung field: Secondary | ICD-10-CM

## 2013-10-18 ENCOUNTER — Ambulatory Visit (INDEPENDENT_AMBULATORY_CARE_PROVIDER_SITE_OTHER): Payer: Medicare Other | Admitting: Nurse Practitioner

## 2013-10-18 ENCOUNTER — Encounter: Payer: Self-pay | Admitting: Nurse Practitioner

## 2013-10-18 VITALS — BP 130/72 | HR 60 | Ht 69.5 in | Wt 212.8 lb

## 2013-10-18 DIAGNOSIS — I10 Essential (primary) hypertension: Secondary | ICD-10-CM

## 2013-10-18 DIAGNOSIS — R9389 Abnormal findings on diagnostic imaging of other specified body structures: Secondary | ICD-10-CM

## 2013-10-18 NOTE — Patient Instructions (Addendum)
We will arrange for a stress Myoview  Keep your follow up with Dr. [name] as planned  Stay on your current medicines for now  Call the [place] office at [phone] if you have any questions, problems or concerns.

## 2013-10-18 NOTE — Progress Notes (Addendum)
Damon [name] Date of Birth: 11-19-1945 Medical Record #[id number]  History of Present Illness: Damon [name] is seen back today for a work in visit. Seen for Damon [name]. He is a 68 year old male with prior AF ablation, HLD, MR, OSA on CPAP, PAD and HTN.  Last seen here in February of 2015. Felt to be doing ok but noted to have claudication in the past. Referred to Dr. Fletcher Anon. His recommendations were as follows:    "The patient has evidence of underlying peripheral arterial disease with mild non-lifestyle limiting claudication. His ABI was normal on the right side moderately  reduced on the left side with evidence of significant left SFA stenosis. Given that he is a nonsmoker and is not diabetic, I think the chance of progression is overall low.  I suspect that his claudication will remain mild.   I discussed management options including medical therapy of risk factors versus proceeding with angiography and possible endovascular intervention. I recommend  keeping the second option only for refractory symptoms. I advised him to start a walking program and regular basis. I will reevaluate his symptoms in 6 months.   He is already on good medical therapy for hypertension and hyperlipidemia."  Comes back today. Here alone. Here because of a chest CT that he had for lung screening. Saw a message on MyChart. Thought his CT was reviewed by Damon [name]. No active symptoms. No chest pain. No chest tightness. Might get a little dizzy if he bends over. Remote smoker. FH positive for CAD. Remains on statin therapy. Wants to know what he should do next.   Current Outpatient Prescriptions  Medication Sig Dispense Refill  . Ascorbic Acid (VITAMIN C) 1000 MG tablet Take 1,500 mg by mouth daily. States he takes aout 1000mg  per day      . bisoprolol-hydrochlorothiazide (ZIAC) 10-6.25 MG per tablet Take 1 tablet by mouth daily.       . cholecalciferol (VITAMIN D) 1000 UNITS tablet Take 10,000 Units by mouth daily.       . magnesium 30 MG tablet Take 30 mg by mouth daily.      . Multiple Vitamin (MULITIVITAMIN WITH MINERALS) TABS Take 1 tablet by mouth daily. FOR MEN      . rosuvastatin (CRESTOR) 20 MG tablet Take 20 mg by mouth every evening.       [name] tablet TAKE ONE TABLET BY MOUTH IN THE EVENING  30 tablet  6   No current facility-administered medications for this visit.    Allergies  Allergen Reactions  . Zetia [Ezetimibe] Other (See Comments)    myalgias    Past Medical History  Diagnosis Date  . Hyperlipidemia   . Mitral regurgitation   . Bladder cancer   . OSA on CPAP   . Hearing loss of both ears   . History of colon polyps     BENIGN  . PAD (peripheral artery disease)     ABI--  RIGHT SIDE NORMAL LEFT SIDE MODERATELY REDUCED W/ DISTAL LEFT SFA STENOSIS//  MILD CALDICATION  . S/P ablation of atrial fibrillation     07-27-2012  . Anticoagulant long-term use     XARELTO  . Hypertension   . PAF (paroxysmal atrial fibrillation) dx  2007 /   PREVIOUS CARDIOVERTED X3  LAST ONE 07-27-2012  POST  ABLATION  07-27-2012    PRIMARY CARDIOLOGIST-  DR [name] CARDIO--  DR Rayann [name]  . Pre-diabetes   .  Vitamin D deficiency     Past Surgical History  Procedure Laterality Date  . Total hip arthroplasty  07/25/2011    Procedure: TOTAL HIP ARTHROPLASTY ANTERIOR APPROACH;  Surgeon: Mcarthur Rossetti;  Location: [facility];  Service: Orthopedics;  Laterality: Right;  . Tee without cardioversion  07/26/2012    Procedure: TRANSESOPHAGEAL ECHOCARDIOGRAM (TEE);  Surgeon: [name], MD;  Location: [facility];  Service: Cardiovascular;  Laterality: N/A;  . Cardioversion  08/06/2012    Procedure: CARDIOVERSION;  Surgeon: [name], MD;  Location: [facility];  Service: Cardiovascular;  Laterality: N/A;  . Total hip arthroplasty Left 02-08-2010  . Cataract extraction w/ intraocular lens  implant, bilateral    . Cardiac electrophysiology study and ablation  07-27-2012  DR  ALLRED    SUCCESSFUL ABLATION OF A-FIB  . Tonsillectomy  age 89  . Cystoscopy w/ retrogrades Bilateral 04/12/2013    Procedure: CYSTOSCOPY WITH RETROGRADE PYELOGRAM;  Surgeon: [name], MD;  Location: [facility];  Service: Urology;  Laterality: Bilateral;  . Transurethral resection of bladder tumor with gyrus (turbt-gyrus) N/A 04/12/2013    Procedure: TRANSURETHRAL RESECTION OF BLADDER TUMOR WITH GYRUS (TURBT-GYRUS);  Surgeon: [name], MD;  Location: [facility];  Service: Urology;  Laterality: N/A;    History  Smoking status  . Former Smoker -- 2.00 packs/day for 10 years  . Types: Cigarettes  . Quit date: 09/20/1981  Smokeless tobacco  . Never Used    History  Alcohol Use No    Comment: pt states he has stopped drinking alcohol    Family History  Problem Relation Age of Onset  . Heart attack Mother   . Heart failure Father     Review of Systems: The review of systems is per the HPI.  All other systems were reviewed and are negative.  Physical Exam: BP 130/72  Pulse 60  Ht 5' 9.5" (1.765 m)  Wt 212 lb 12.8 oz (96.525 kg)  BMI 30.98 kg/m2  SpO2 93% Patient is very pleasant and in no acute distress. Skin is warm and dry. Color is normal.  HEENT is unremarkable. Normocephalic/atraumatic. PERRL. Sclera are nonicteric. Neck is supple. No masses. No JVD. Lungs are clear. Cardiac exam shows a regular rate and rhythm. Abdomen is soft. Extremities are without edema. Gait and ROM are intact. No gross neurologic deficits noted.  LABORATORY DATA:   Lab Results  Component Value Date   WBC 7.2 09/22/2013   HGB 16.1 09/22/2013   HCT 45.6 09/22/2013   PLT 225 09/22/2013   GLUCOSE 96 09/22/2013   CHOL 153 09/22/2013   TRIG 61 09/22/2013   HDL 58 09/22/2013   LDLCALC 83 09/22/2013   ALT 22 09/22/2013   AST 18 09/22/2013   NA 139 09/22/2013   K 4.1 09/22/2013   CL 103 09/22/2013   CREATININE 0.90 09/22/2013   BUN 16 09/22/2013   CO2 28 09/22/2013   TSH 0.802  06/22/2013   INR 1.19 07/11/2011   HGBA1C 5.7* 09/22/2013   CT CHEST FINDINGS: 7 mm nodule in the left lower lobe on image 43 is stable since prior study. Small clustered nodules peripherally in the left lower lobe on image 40 are also stable. 4 mm nodule in the right middle lobe on image 37. This area was not definitively seen on the prior abdominal CT. Linear densities and ground-glass opacities in the lingula likely reflects scarring, stable.  3 mm nodule in the right lower lobe on image  74.  Densely calcified coronary arteries. Insert Heart aortic arch and descending thoracic aorta calcified. No mediastinal, hilar, or axillary adenopathy. Chest wall soft tissues are unremarkable.  Imaging into the upper abdomen demonstrates calcifications in the liver and spleen compatible with old granulomatous disease.  No acute bony abnormality.  IMPRESSION: Stable nodules in the left lower lobe. There are also small nodules in the right middle lobe and right lower lobe which were not imaged on the prior CT abdomen. Recommend additional follow-up study in 12 months to assess stability.  Severe coronary artery disease.  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   Electronically Signed By: Rolm Baptise M.D. On: 10/14/2013 14:19      Assessment / Plan: 1. PAF - prior ablation - remains on his Xarelto and in sinus by last EKG last month.  2. Abnormal chest CT - has known HLD and PAD with positive FH - ? Needs cardiac CT versus stress testing? Have talked with both Dr. Caryl Comes and Dr. Burt Knack. Dr. Burt Knack prefers to proceed on with stress Myoview. Has had some past nonspecific T wave changes noted on last EKG. He is advised that risk factor modification is priority as well. He has been walking at home and we will attempt to walk on the treadmill as well.  Patient is agreeable to this plan and will call if any problems develop in the interim.   Burtis Junes, RN,  [place] [address] [place], [place]  [phone]

## 2013-10-28 ENCOUNTER — Encounter: Payer: Self-pay | Admitting: Cardiovascular Disease

## 2013-10-30 ENCOUNTER — Other Ambulatory Visit: Payer: Self-pay | Admitting: Emergency Medicine

## 2013-10-30 DIAGNOSIS — R918 Other nonspecific abnormal finding of lung field: Secondary | ICD-10-CM

## 2013-10-31 ENCOUNTER — Ambulatory Visit (HOSPITAL_COMMUNITY): Payer: Medicare Other | Attending: Internal Medicine | Admitting: Radiology

## 2013-10-31 VITALS — BP 121/67 | HR 64 | Ht 69.5 in | Wt 211.0 lb

## 2013-10-31 DIAGNOSIS — Z8249 Family history of ischemic heart disease and other diseases of the circulatory system: Secondary | ICD-10-CM | POA: Insufficient documentation

## 2013-10-31 DIAGNOSIS — R0602 Shortness of breath: Secondary | ICD-10-CM

## 2013-10-31 DIAGNOSIS — Z87891 Personal history of nicotine dependence: Secondary | ICD-10-CM | POA: Insufficient documentation

## 2013-10-31 MED ORDER — TECHNETIUM TC 99M SESTAMIBI GENERIC - CARDIOLITE
30.0000 | Freq: Once | INTRAVENOUS | Status: AC | PRN
  Administered 2013-10-31: 30 via INTRAVENOUS

## 2013-10-31 MED ORDER — TECHNETIUM TC 99M SESTAMIBI GENERIC - CARDIOLITE
10.0000 | Freq: Once | INTRAVENOUS | Status: AC | PRN
  Administered 2013-10-31: 10 via INTRAVENOUS

## 2013-10-31 NOTE — Progress Notes (Signed)
[facility] [address] [place], [place] [address] [phone]    Cardiology Nuclear Med Study  JAYVIER BURGHER is a 68 y.o. male     MRN : [id number]     DOB: [birth year]  Procedure Date: 10/31/2013  Nuclear Med Background Indication for Stress Test:  Evaluation for Ischemia, and 09-30-2013 CT Chest: (+) Coronary Calcification History:  No known CAD, Prior AF ablation, Echo 2014, MPI 2003 EF 65% Cardiac Risk Factors: Family History - CAD, History of Smoking, Hypertension, Lipids and PVD  Symptoms:  SOB   Nuclear Pre-Procedure Caffeine/Decaff Intake:  11:00pm NPO After: 11:00pm   Lungs:  clear O2 Sat: 94% on room air. IV 0.9% NS with Angio Cath:  22g  IV Site: R Antecubital x 1, tolerated well IV Started by:  Irven Baltimore, RN  Chest Size (in):  44 Cup Size: n/a  Height: 5' 9.5" (1.765 m)  Weight:  211 lb (95.709 kg)  BMI:  Body mass index is 30.72 kg/(m^2). Tech Comments: Last dose Ziac was 6:00pm yesterday    Nuclear Med Study 1 or 2 day study: 1 day  Stress Test Type:  Stress  Reading MD: N/A  Order Authorizing Provider:  [name], MD, Truitt Merle, NP  Resting Radionuclide: Technetium 38m Sestamibi  Resting Radionuclide Dose: 11.0 mCi   Stress Radionuclide:  Technetium 23m Sestamibi  Stress Radionuclide Dose: 33.0 mCi           Stress Protocol Rest HR: 64 Stress HR: 142  Rest BP: 121/67 Stress BP: 181/80  Exercise Time (min): 7:30 METS: 9.3           Dose of Adenosine (mg):  n/a Dose of Lexiscan: n/a mg  Dose of Atropine (mg): n/a Dose of Dobutamine: n/a mcg/kg/min (at max HR)  Stress Test Technologist: Glade Lloyd, BS-ES  Nuclear Technologist:  Annye Rusk, CNMT     Rest Procedure:  Myocardial perfusion imaging was performed at rest 45 minutes following the intravenous administration of Technetium 46m Sestamibi. Rest ECG: NSR - Normal EKG  Stress Procedure:  The patient exercised on the treadmill utilizing the Bruce  Protocol for 7:30 minutes. The patient stopped due to SOB and 1-2/10 chest tightness.  Technetium 24m Sestamibi was injected at peak exercise and myocardial perfusion imaging was performed after a brief delay.  Stress ECG: 2-3 mm horizontal ST depression in lateral leads  QPS Raw Data Images:  Normal; no motion artifact; normal heart/lung ratio. Stress Images:  There is decreased uptake in the inferior wall. Rest Images:  There is decreased uptake in the inferior wall. Subtraction (SDS):  There is a fixed inferior defect that is most consistent with diaphragmatic attenuation. Transient Ischemic Dilatation (Normal <1.22):  0.86 Lung/Heart Ratio (Normal <0.45):  0.36  Quantitative Gated Spect Images QGS EDV:  119 ml QGS ESV:  54 ml  Impression Exercise Capacity:  Good exercise capacity. BP Response:  Normal blood pressure response. Clinical Symptoms:  1-2/10 chest tightness, dyspnea, PVC's ECG Impression:  2 mm horizontal ST depression laterally, scattered PVC's Comparison with Prior Nuclear Study: Prior study in 2013 was non-ischemic  Overall Impression:  Intermediate risk stress nuclear study with fixed inferior bowel attenuation artifact. and abnormal treadmill EKG findings with ST depression, chest pain and Duke Score of -2.  Clinical correlation is advised.  LV Ejection Fraction: 55%.  LV Wall Motion:  NL LV Function; NL Wall Motion  Pixie Casino, MD, [facility] Board Certified in Nuclear Cardiology  Attending Cardiologist [place]   .

## 2013-11-02 ENCOUNTER — Encounter: Payer: Self-pay | Admitting: Internal Medicine

## 2013-11-03 ENCOUNTER — Telehealth: Payer: Self-pay | Admitting: *Deleted

## 2013-11-03 NOTE — Telephone Encounter (Signed)
S/w pt's wife scheduled appt with Dr. [name] in the on 4/29   office for pre-cath instructions stated with Cecille Rubin noted EKG changes and chest discomfort

## 2013-11-16 ENCOUNTER — Encounter: Payer: Self-pay | Admitting: Nurse Practitioner

## 2013-11-16 ENCOUNTER — Ambulatory Visit (INDEPENDENT_AMBULATORY_CARE_PROVIDER_SITE_OTHER): Payer: Medicare Other | Admitting: Nurse Practitioner

## 2013-11-16 VITALS — BP 118/70 | HR 58 | Ht 69.5 in | Wt 208.4 lb

## 2013-11-16 DIAGNOSIS — I10 Essential (primary) hypertension: Secondary | ICD-10-CM

## 2013-11-16 DIAGNOSIS — E785 Hyperlipidemia, unspecified: Secondary | ICD-10-CM

## 2013-11-16 DIAGNOSIS — R9439 Abnormal result of other cardiovascular function study: Secondary | ICD-10-CM

## 2013-11-16 DIAGNOSIS — I4891 Unspecified atrial fibrillation: Secondary | ICD-10-CM

## 2013-11-16 LAB — BASIC METABOLIC PANEL
BUN: 17 mg/dL (ref 6–23)
CO2: 25 mEq/L (ref 19–32)
Calcium: 9.7 mg/dL (ref 8.4–10.5)
Chloride: 104 mEq/L (ref 96–112)
Creatinine, Ser: 0.9 mg/dL (ref 0.4–1.5)
GFR: 95.12 mL/min (ref >60.00)
Glucose, Bld: 78 mg/dL (ref 70–99)
Potassium: 3.6 mEq/L (ref 3.5–5.1)
Sodium: 139 mEq/L (ref 135–145)

## 2013-11-16 LAB — PROTIME-INR
INR: 1.1 ratio — ABNORMAL HIGH (ref 0.8–1.0)
Prothrombin Time: 12.1 s (ref 9.6–13.1)

## 2013-11-16 LAB — CBC
HCT: 45 % (ref 39.0–52.0)
Hemoglobin: 15.4 g/dL (ref 13.0–17.0)
MCHC: 34.1 g/dL (ref 30.0–36.0)
MCV: 88.9 fl (ref 78.0–100.0)
Platelets: 227 10*3/uL (ref 150.0–400.0)
RBC: 5.06 Mil/uL (ref 4.22–5.81)
RDW: 13.3 % (ref 11.5–14.6)
WBC: 8 10*3/uL (ref 4.5–10.5)

## 2013-11-16 LAB — APTT: aPTT: 29.9 s — ABNORMAL HIGH (ref 21.7–28.8)

## 2013-11-16 NOTE — Progress Notes (Signed)
Damon [name] Date of Birth: 03-Jan-1946 Medical Record #[id number]  History of Present Illness: Damon [name] is seen back today for a follow up visit. Seen for Dr. [name]. He is a 68 year old male with prior AF ablation, HLD, MR, OSA on CPAP, PAD and HTN.   Last seen here in February of 2015. Felt to be doing ok but noted to have claudication in the past. Referred to Dr. Fletcher Anon. His recommendations were as follows:  "The patient has evidence of underlying peripheral arterial disease with mild non-lifestyle limiting claudication. His ABI was normal on the right side moderately reduced on the left side with evidence of significant left SFA stenosis. Given that he is a nonsmoker and is not diabetic, I think the chance of progression is overall low. I suspect that his claudication will remain mild.  I discussed management options including medical therapy of risk factors versus proceeding with angiography and possible endovascular intervention. I recommend keeping the second option only for refractory symptoms. I advised him to start a walking program and regular basis. I will reevaluate his symptoms in 6 months.  He is already on good medical therapy for hypertension and hyperlipidemia."   I saw him last month - He was here because of a chest CT that he had for a lung screening. Saw a message on MyChart.  No active symptoms. No chest pain. No chest tightness. Wanted to know what he should do next. Has multiple CV risk factors for CAD. He was referred for Myoview after discussion with both Dr. Caryl Comes and Dr. Burt Knack.   Comes back today. Here alone. His Myoview is reviewed with him. He says he got really short of breath with the study but no chest pain. He plays golf a couple of times a week and is active on his farm but no real exercise program. FH positive for CAD - both parents died in their 52's. Has treated HTN and HLD. Remote smoke. Gender is male. He has cut his Xarelto back to every other day - had  been told by Dr. [name] in the past that he could stop but he has elected to stay on - just taking every other day.   Current Outpatient Prescriptions  Medication Sig Dispense Refill  . Ascorbic Acid (VITAMIN C) 1000 MG tablet Take 1,500 mg by mouth daily. States he takes aout 1000mg  per day      . bisoprolol-hydrochlorothiazide (ZIAC) 10-6.25 MG per tablet Take 1 tablet by mouth daily.       . cholecalciferol (VITAMIN D) 1000 UNITS tablet Take 10,000 Units by mouth daily.      . Multiple Vitamin (MULITIVITAMIN WITH MINERALS) TABS Take 1 tablet by mouth daily. FOR MEN      . Rivaroxaban (XARELTO) 20 MG TABS tablet TAKE ONE TABLET BY MOUTH every other day      . rosuvastatin (CRESTOR) 20 MG tablet Take 20 mg by mouth every evening.        No current facility-administered medications for this visit.    Allergies  Allergen Reactions  . Zetia [Ezetimibe] Other (See Comments)    myalgias    Past Medical History  Diagnosis Date  . Hyperlipidemia   . Mitral regurgitation   . Bladder cancer   . OSA on CPAP   . Hearing loss of both ears   . History of colon polyps     BENIGN  . PAD (peripheral artery disease)     ABI--  RIGHT SIDE NORMAL  LEFT SIDE MODERATELY REDUCED W/ DISTAL LEFT SFA STENOSIS//  MILD CALDICATION  . S/P ablation of atrial fibrillation     07-27-2012  . Anticoagulant long-term use     XARELTO  . Hypertension   . PAF (paroxysmal atrial fibrillation) dx  2007 /   PREVIOUS CARDIOVERTED X3  LAST ONE 07-27-2012  POST  ABLATION  07-27-2012    PRIMARY CARDIOLOGIST-  DR [name] CARDIO--  DR [name]  . Pre-diabetes   . Vitamin D deficiency     Past Surgical History  Procedure Laterality Date  . Total hip arthroplasty  07/25/2011    Procedure: TOTAL HIP ARTHROPLASTY ANTERIOR APPROACH;  Surgeon: Mcarthur Rossetti;  Location: [facility];  Service: Orthopedics;  Laterality: Right;  . Tee without cardioversion  07/26/2012    Procedure: TRANSESOPHAGEAL ECHOCARDIOGRAM  (TEE);  Surgeon: [name], MD;  Location: [facility];  Service: Cardiovascular;  Laterality: N/A;  . Cardioversion  08/06/2012    Procedure: CARDIOVERSION;  Surgeon: [name], MD;  Location: [place];  Service: Cardiovascular;  Laterality: N/A;  . Total hip arthroplasty Left 02-08-2010  . Cataract extraction w/ intraocular lens  implant, bilateral    . Cardiac electrophysiology study and ablation  07-27-2012  DR ALLRED    SUCCESSFUL ABLATION OF A-FIB  . Tonsillectomy  age 14  . Cystoscopy w/ retrogrades Bilateral 04/12/2013    Procedure: CYSTOSCOPY WITH RETROGRADE PYELOGRAM;  Surgeon: [name], MD;  Location: [facility];  Service: Urology;  Laterality: Bilateral;  . Transurethral resection of bladder tumor with gyrus (turbt-gyrus) N/A 04/12/2013    Procedure: TRANSURETHRAL RESECTION OF BLADDER TUMOR WITH GYRUS (TURBT-GYRUS);  Surgeon: [name], MD;  Location: [facility];  Service: Urology;  Laterality: N/A;    History  Smoking status  . Former Smoker -- 2.00 packs/day for 10 years  . Types: Cigarettes  . Quit date: 09/20/1981  Smokeless tobacco  . Never Used    History  Alcohol Use No    Comment: pt states he has stopped drinking alcohol    Family History  Problem Relation Age of Onset  . Heart attack Mother   . Heart failure Father     Review of Systems: The review of systems is per the HPI.  All other systems were reviewed and are negative.  Physical Exam: BP 118/70  Pulse 58  Ht 5' 9.5" (1.765 m)  Wt 208 lb 6.4 oz (94.53 kg)  BMI 30.34 kg/m2 Patient is very pleasant and in no acute distress. Skin is warm and dry. Color is normal.  HEENT is unremarkable. Normocephalic/atraumatic. PERRL. Sclera are nonicteric. Neck is supple. No masses. No JVD. Lungs are clear. Cardiac exam shows a regular rate and rhythm. Abdomen is soft. Extremities are without edema. Gait and ROM are intact. No gross neurologic deficits  noted.  LABORATORY DATA: EKG shows sinus brady and is normal.   Lab Results  Component Value Date   WBC 7.2 09/22/2013   HGB 16.1 09/22/2013   HCT 45.6 09/22/2013   PLT 225 09/22/2013   GLUCOSE 96 09/22/2013   CHOL 153 09/22/2013   TRIG 61 09/22/2013   HDL 58 09/22/2013   LDLCALC 83 09/22/2013   ALT 22 09/22/2013   AST 18 09/22/2013   NA 139 09/22/2013   K 4.1 09/22/2013   CL 103 09/22/2013   CREATININE 0.90 09/22/2013   BUN 16 09/22/2013   CO2 28 09/22/2013   TSH 0.802 06/22/2013   INR  1.19 07/11/2011   HGBA1C 5.7* 09/22/2013      Myoview Overall Impression: Intermediate risk stress nuclear study with fixed inferior bowel attenuation artifact. and abnormal treadmill EKG findings with ST depression, chest pain and Duke Score of -2. Clinical correlation is advised.  LV Ejection Fraction: 55%. LV Wall Motion: NL LV Function; NL Wall Motion  Pixie Casino, MD, [facility]  Board Certified in Nuclear Cardiology  Attending Cardiologist  [facility]  CT CHEST FINDINGS: 7 mm nodule in the left lower lobe on image 43 is stable since prior study. Small clustered nodules peripherally in the left lower lobe on image 40 are also stable. 4 mm nodule in the right middle lobe on image 37. This area was not definitively seen on the prior abdominal CT. Linear densities and ground-glass opacities in the lingula likely reflects scarring, stable.  3 mm nodule in the right lower lobe on image 31.  Densely calcified coronary arteries. Insert Heart aortic arch and descending thoracic aorta calcified. No mediastinal, hilar, or axillary adenopathy. Chest wall soft tissues are unremarkable.  Imaging into the upper abdomen demonstrates calcifications in the liver and spleen compatible with old granulomatous disease.  No acute bony abnormality.  IMPRESSION: Stable nodules in the left lower lobe. There are also small nodules in the right middle lobe and right lower lobe which were not imaged on the prior CT abdomen.  Recommend additional follow-up study in 12 months to assess stability.  Severe coronary artery disease.  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   Electronically Signed By: Rolm Baptise M.D. On: 10/14/2013 14:19   Assessment / Plan: 1. PAF - prior ablation - remains on his Xarelto (but taking every other day - probably not beneficial) and remains in sinus.   2. Abnormal chest CT - as noted above.  3. Intermediate Myoview in a patient with multiple CV risk factors - discussed option of continued medical management and CV risk factor modification versus cardiac cath - patient is wanting to proceed with cardiac cath - discussed with Dr. [name] who is in agreement.   Patient is agreeable to this plan and will call if any problems develop in the interim.   Burtis Junes, RN, [place]  [address] [place]  [place], [place] [address]  [phone]

## 2013-11-16 NOTE — Patient Instructions (Signed)
We will check labs today  We will arrange for a cardiac catheterization with Dr. Shirley Friar are scheduled for a cardiac catheterization on Thursday, May 7th at [place] with Dr. Angelena Form or associate.  Go to [facility] 2nd Floor Short Stay on Thursday, May 7th at [place] thru the [employer] entrance A No food or drink after midnight on Wednesday, May 6th. You may take your medications with a sip of water on the day of your procedure.   You will need someone to drive you home after your procedure.   Do not take your Xarelto for 48 hours prior to this procedure (skip Tuesday and Wednesday)  Call the [place] office at [phone] if you have any questions, problems or concerns.  Coronary Angiography Coronary angiography is an X-ray procedure used to look at the arteries in the heart. In this procedure, a dye (contrast dye) is injected through a long, hollow tube (catheter). The catheter is about the size of a piece of cooked spaghetti and is inserted through your groin, wrist, or arm. The dye is injected into each artery, and X-rays are then taken to show if there is a blockage in the arteries of your heart. LET [facility] CARE PROVIDER KNOW ABOUT:  Any allergies you have, including allergies to shellfish or contrast dye.   All medicines you are taking, including vitamins, herbs, eye drops, creams, and over-the-counter medicines.   Previous problems you or members of your family have had with the use of anesthetics.   Any blood disorders you have.   Previous surgeries you have had.  History of kidney problems or failure.   Other medical conditions you have. RISKS AND COMPLICATIONS  Generally, coronary angiography is a safe procedure. However, as with any procedure, complications can occur. Possible complications include:  Allergic reaction to the dye.  Bleeding from the access site or other locations.  Kidney injury, especially in people  with impaired kidney function.  Stroke (rare).  Heart attack (rare). BEFORE THE PROCEDURE   Do not eat or drink anything after midnight the night before the procedure, or as directed by your health care provider.   Ask your health care provider if it is okay to take any needed medicines with a sip of water.  PROCEDURE  You may be given a medicine to help you relax (sedative) before the procedure. This medicine is given through an intravenous (IV) access tube that is inserted into one of your veins.   The area where the catheter will be inserted is washed and shaved. This is usually done in the groin but may be done in the fold of your arm (near your elbow) or in the wrist.   A medicine will be given to numb the area where the catheter will be inserted (local anesthetic).   The health care provider will insert the catheter into an artery. The catheter is guided by using a special type of X-ray (fluoroscopy) of the blood vessel being examined.   A special dye is then injected into the catheter, and X-rays are taken. The dye helps to show where any narrowing or blockages are located in the heart arteries.  AFTER THE PROCEDURE   If the procedure is done through the leg, you will be kept in bed lying flat for several hours. You will be instructed to not bend or cross your legs.  The insertion site will be checked frequently.   The pulse in your  feet or wrist will be checked frequently.   Additional blood tests, X-rays, and an electrocardiogram may be done.   You may need to stay in the hospital overnight for observation.  Document Released: 01/11/2003 Document Revised: 03/09/2013 Document Reviewed: 11/29/2012 [facility] Patient Information 2014 [place].

## 2013-11-24 ENCOUNTER — Ambulatory Visit (HOSPITAL_COMMUNITY)
Admission: RE | Admit: 2013-11-24 | Discharge: 2013-11-25 | Disposition: A | Discharge status: Home / Self Care | Payer: Medicare Other | Source: Ambulatory Visit | Attending: Cardiovascular Disease | Admitting: Cardiovascular Disease

## 2013-11-24 ENCOUNTER — Encounter (HOSPITAL_COMMUNITY)
Admission: RE | Disposition: A | Discharge status: Home / Self Care | Payer: Self-pay | Source: Ambulatory Visit | Attending: Cardiovascular Disease

## 2013-11-24 DIAGNOSIS — I200 Unstable angina: Secondary | ICD-10-CM | POA: Insufficient documentation

## 2013-11-24 DIAGNOSIS — Z96649 Presence of unspecified artificial hip joint: Secondary | ICD-10-CM | POA: Insufficient documentation

## 2013-11-24 DIAGNOSIS — I4891 Unspecified atrial fibrillation: Secondary | ICD-10-CM

## 2013-11-24 DIAGNOSIS — I2510 Atherosclerotic heart disease of native coronary artery without angina pectoris: Secondary | ICD-10-CM

## 2013-11-24 DIAGNOSIS — I10 Essential (primary) hypertension: Secondary | ICD-10-CM | POA: Diagnosis present

## 2013-11-24 DIAGNOSIS — I059 Rheumatic mitral valve disease, unspecified: Secondary | ICD-10-CM | POA: Insufficient documentation

## 2013-11-24 DIAGNOSIS — R7309 Other abnormal glucose: Secondary | ICD-10-CM | POA: Insufficient documentation

## 2013-11-24 DIAGNOSIS — Z8249 Family history of ischemic heart disease and other diseases of the circulatory system: Secondary | ICD-10-CM | POA: Insufficient documentation

## 2013-11-24 DIAGNOSIS — E559 Vitamin D deficiency, unspecified: Secondary | ICD-10-CM | POA: Insufficient documentation

## 2013-11-24 DIAGNOSIS — R918 Other nonspecific abnormal finding of lung field: Secondary | ICD-10-CM | POA: Insufficient documentation

## 2013-11-24 DIAGNOSIS — E782 Mixed hyperlipidemia: Secondary | ICD-10-CM | POA: Insufficient documentation

## 2013-11-24 DIAGNOSIS — Z87891 Personal history of nicotine dependence: Secondary | ICD-10-CM | POA: Insufficient documentation

## 2013-11-24 DIAGNOSIS — Z8551 Personal history of malignant neoplasm of bladder: Secondary | ICD-10-CM | POA: Insufficient documentation

## 2013-11-24 DIAGNOSIS — G4733 Obstructive sleep apnea (adult) (pediatric): Secondary | ICD-10-CM | POA: Insufficient documentation

## 2013-11-24 DIAGNOSIS — Z7901 Long term (current) use of anticoagulants: Secondary | ICD-10-CM | POA: Insufficient documentation

## 2013-11-24 DIAGNOSIS — I70219 Atherosclerosis of native arteries of extremities with intermittent claudication, unspecified extremity: Secondary | ICD-10-CM | POA: Insufficient documentation

## 2013-11-24 HISTORY — PX: LEFT HEART CATHETERIZATION WITH CORONARY ANGIOGRAM: SHX5451

## 2013-11-24 HISTORY — PX: PERCUTANEOUS CORONARY STENT INTERVENTION (PCI-S): SHX5485

## 2013-11-24 LAB — POCT ACTIVATED CLOTTING TIME
ACTIVATED CLOTTING TIME: 254 s
ACTIVATED CLOTTING TIME: 570 s
Activated Clotting Time: >1000 seconds

## 2013-11-24 SURGERY — LEFT HEART CATHETERIZATION WITH CORONARY ANGIOGRAM
Anesthesia: LOCAL

## 2013-11-24 MED ORDER — SODIUM CHLORIDE 0.9 % IJ SOLN: 3.0000 mL | Freq: Two times a day (BID) | INTRAMUSCULAR | Status: DC

## 2013-11-24 MED ORDER — FAMOTIDINE IN NACL 20-0.9 MG/50ML-% IV SOLN
INTRAVENOUS | Status: AC
Start: 2013-11-24 — End: 2013-11-24
  Filled 2013-11-24: qty 50

## 2013-11-24 MED ORDER — MIDAZOLAM HCL 2 MG/2ML IJ SOLN
INTRAMUSCULAR | Status: AC
  Filled 2013-11-24: qty 2

## 2013-11-24 MED ORDER — ATORVASTATIN CALCIUM 40 MG PO TABS
40.0000 mg | ORAL_TABLET | Freq: Every day | ORAL | Status: DC
  Administered 2013-11-24: 17:00:00 40 mg via ORAL
  Filled 2013-11-24 (×2): qty 1

## 2013-11-24 MED ORDER — CLOPIDOGREL BISULFATE 75 MG PO TABS
ORAL_TABLET | ORAL | Status: AC
Start: 2013-11-24 — End: 2013-11-24
  Filled 2013-11-24: qty 8

## 2013-11-24 MED ORDER — ADENOSINE 12 MG/4ML IV SOLN
16.0000 mL | Freq: Once | INTRAVENOUS | Status: DC
  Filled 2013-11-24: qty 16

## 2013-11-24 MED ORDER — VERAPAMIL HCL 2.5 MG/ML IV SOLN
INTRAVENOUS | Status: AC
  Filled 2013-11-24: qty 2

## 2013-11-24 MED ORDER — DIAZEPAM 5 MG PO TABS
10.0000 mg | ORAL_TABLET | ORAL | Status: AC
  Administered 2013-11-24: 10 mg via ORAL
  Filled 2013-11-24: qty 2

## 2013-11-24 MED ORDER — SODIUM CHLORIDE 0.9 % IV SOLN: 250.0000 mL | INTRAVENOUS | Status: DC | PRN

## 2013-11-24 MED ORDER — SODIUM CHLORIDE 0.9 % IV SOLN
INTRAVENOUS | Status: DC
  Administered 2013-11-24: 08:00:00 via INTRAVENOUS

## 2013-11-24 MED ORDER — NITROGLYCERIN 0.2 MG/ML ON CALL CATH LAB
INTRAVENOUS | Status: AC
  Filled 2013-11-24: qty 1

## 2013-11-24 MED ORDER — LIDOCAINE HCL (PF) 1 % IJ SOLN
INTRAMUSCULAR | Status: AC
Start: 2013-11-24 — End: 2013-11-24
  Filled 2013-11-24: qty 30

## 2013-11-24 MED ORDER — ASPIRIN 81 MG PO CHEW
81.0000 mg | CHEWABLE_TABLET | ORAL | Status: AC
  Administered 2013-11-24: 81 mg via ORAL
  Filled 2013-11-24: qty 1

## 2013-11-24 MED ORDER — HEPARIN SODIUM (PORCINE) 1000 UNIT/ML IJ SOLN
INTRAMUSCULAR | Status: AC
Start: 2013-11-24 — End: 2013-11-24
  Filled 2013-11-24: qty 1

## 2013-11-24 MED ORDER — HEPARIN (PORCINE) IN NACL 2-0.9 UNIT/ML-% IJ SOLN
INTRAMUSCULAR | Status: AC
  Filled 2013-11-24: qty 1000

## 2013-11-24 MED ORDER — FENTANYL CITRATE 0.05 MG/ML IJ SOLN
INTRAMUSCULAR | Status: AC
  Filled 2013-11-24: qty 2

## 2013-11-24 MED ORDER — ASPIRIN 81 MG PO CHEW
81.0000 mg | CHEWABLE_TABLET | Freq: Every day | ORAL | Status: DC
  Administered 2013-11-25: 81 mg via ORAL
  Filled 2013-11-24: qty 1

## 2013-11-24 MED ORDER — BISOPROLOL-HYDROCHLOROTHIAZIDE 10-6.25 MG PO TABS
1.0000 | ORAL_TABLET | Freq: Every day | ORAL | Status: DC
  Administered 2013-11-24 – 2013-11-25 (×2): 1 via ORAL
  Filled 2013-11-24 (×2): qty 1

## 2013-11-24 MED ORDER — HEPARIN SODIUM (PORCINE) 1000 UNIT/ML IJ SOLN
INTRAMUSCULAR | Status: AC
  Filled 2013-11-24: qty 1

## 2013-11-24 MED ORDER — CLOPIDOGREL BISULFATE 75 MG PO TABS
75.0000 mg | ORAL_TABLET | Freq: Every day | ORAL | Status: DC
  Administered 2013-11-25: 08:00:00 75 mg via ORAL
  Filled 2013-11-24: qty 1

## 2013-11-24 MED ORDER — SODIUM CHLORIDE 0.9 % IJ SOLN: 3.0000 mL | INTRAMUSCULAR | Status: DC | PRN

## 2013-11-24 MED ORDER — SODIUM CHLORIDE 0.9 % IV SOLN: INTRAVENOUS | Status: AC

## 2013-11-24 NOTE — CV Procedure (Signed)
Cardiac Catheterization Operative Report  Damon [name] [id number] 5/7/201511:01 AM MCKEOWN,WILLIAM DAVID, MD  Procedure Performed:  1. Left Heart Catheterization 2. Selective Coronary Angiography 3. Left ventricular angiogram 4. PTCA proximal Circumflex with balloon angioplasty only, failed attempt to place stent due to calcified vessel. 5. Fractional flow reserve LAD  Operator: Lauree Chandler, MD  Arterial access site:  Right radial artery.   Indication:  68 yo male with HTN, HLD, former tobacco abuse, atrial fib with recent stress myoview with inferior ischemia, chest pain and dypsnea with exercise, ST changes c/w ischemia. He reports class II exertional angina/dyspnea.                                     Procedure Details: The risks, benefits, complications, treatment options, and expected outcomes were discussed with the patient. The patient and/or family concurred with the proposed plan, giving informed consent. The patient was brought to the cath lab after IV hydration was begun and oral premedication was given. The patient was further sedated with Versed and Fentanyl. The right wrist was assessed with an Allens test which was positive. The right wrist was prepped and draped in a sterile fashion. 1% lidocaine was used for local anesthesia. Using the modified Seldinger access technique, a 5 French sheath was placed in the right radial artery. 3 mg Verapamil was given through the sheath. 5000 units IV heparin was given. Standard diagnostic catheters were used to perform selective coronary angiography. A pigtail catheter was used to perform a left ventricular angiogram. He was found to have moderate LAD stenosis and severe proximal Circumflex stenosis. I elected to proceed to FFR of the LAD and PCI of the Circumflex.   PCI Note:   FFR of LAD: The patient was given an additional 4000 Units IV heparin. ACT was over 200. The left main was engaged with a XB LAD 3.5 guiding  catheter. I then passed a flow wire down the LAD. Baseline FFR 0.93. With infusion of IV adenosine the FFR was 0.87 suggesting the moderate calcified mid LAD stenosis was not flow limiting. I then turned my attention to the Circumflex.   Lesion #1 proximal Circumflex: He was given 600 mg Plavix po x 1. I then advanced a Cougar IC wire down the Circumflex. I could not pass a balloon. I then used a Whisper wire as a buddy wire for support. I was then able to pass a 1.5 mm balloon into the proximal Circumflex and inflated to 12 atm x 2. I then passed a 2.0 x 12 mm balloon over the wire and inflated x 2 at 12 atm. I was unable to pass a stent over the wire. I tried to pass an Angiosculpt balloon into the vessel could not pass this device to calcification. I then passed a Mailman wire into the Circumflex for better support but could not pass the stent over this heavier wire. I had good flow down the vessel so I stopped the case with plans to stage rotablator atherectomy next week.   The sheath was removed from the right radial artery and a Terumo hemostasis band was applied at the arteriotomy site on the right wrist.  There were no immediate complications. The patient was taken to the recovery area in stable condition.   Hemodynamic Findings: Central aortic pressure: 110/55 Left ventricular pressure: 106/7/14  Angiographic Findings:  Left main: Diffuse proximal and mid  20-30% stenosis.   Left Anterior Descending Artery: Large caliber vessel that courses to the apex. The vessel is heavily calcified in the proximal and mid segments. The mid vessel has a 50% heavily calcified stenosis. The distal vessel has diffuse plaque. The first diagonal branch is very small in caliber with ostial 50% stenosis. The second diagonal branch is moderate in caliber with mild plaque.    Circumflex Artery: Large vessel with heavy calcification in the proximal segment. There is a 99% proximal stenosis just beyond the ostium. The  first OM branch is patent with mild plaque. The continuation of the AV groove Circumflex has a 40% stenosis.   Right Coronary Artery: Large dominant vessel with diffuse 20% stenosis, moderate proximal and mid calcification.   Left Ventricular Angiogram: LVEF=55-60%.   Impression: 1. Severe single vessel CAD with heavily calcified stenosis in the proximal Circumflex, likely culprit lesion for unstable angina 2. PTCA of the Circumflex with some improvement in luminal narrowing but unable to deliver stent due to calcification 3. Moderate LAD stenosis with FFR demonstrating adequate flow down vessel 4. Normal LV systolic function  Recommendations: Will continue ASA and Plavix. Will plan to bring back in 1-2 weeks for staged PCI with rotablator atherectomy of the proximal Circumflex. Continue statin, beta blocker. Monitor overnight and discharge home in am if stable.        Complications:  None. The patient tolerated the procedure well.

## 2013-11-24 NOTE — Interval H&P Note (Signed)
History and Physical Interval Note:  11/24/2013 8:51 AM  Damon [name]  has presented today for cardiac cath with the diagnosis of abnormal myoview  The various methods of treatment have been discussed with the patient and family. After consideration of risks, benefits and other options for treatment, the patient has consented to  Procedure(s): LEFT HEART CATHETERIZATION WITH CORONARY ANGIOGRAM (N/A) as a surgical intervention .  The patient's history has been reviewed, patient examined, no change in status, stable for surgery.  I have reviewed the patient's chart and labs.  Questions were answered to the patient's satisfaction.    Cath Lab Visit (complete for each Cath Lab visit)  Clinical Evaluation Leading to the Procedure:   ACS: no  Non-ACS:    Anginal Classification: No Symptoms  Anti-ischemic medical therapy: single therapy  Non-Invasive Test Results: Intermediate-risk stress test findings: cardiac mortality 1-3%/year  Prior CABG: No previous CABG        [name]

## 2013-11-24 NOTE — H&P (View-Only) (Signed)
Damon [name] Date of Birth: 03-Jan-1946 Medical Record #[id number]  History of Present Illness: Damon [name] is seen back today for a follow up visit. Seen for Dr. [name]. He is a 68 year old male with prior AF ablation, HLD, MR, OSA on CPAP, PAD and HTN.   Last seen here in February of 2015. Felt to be doing ok but noted to have claudication in the past. Referred to Dr. Fletcher Anon. His recommendations were as follows:  "The patient has evidence of underlying peripheral arterial disease with mild non-lifestyle limiting claudication. His ABI was normal on the right side moderately reduced on the left side with evidence of significant left SFA stenosis. Given that he is a nonsmoker and is not diabetic, I think the chance of progression is overall low. I suspect that his claudication will remain mild.  I discussed management options including medical therapy of risk factors versus proceeding with angiography and possible endovascular intervention. I recommend keeping the second option only for refractory symptoms. I advised him to start a walking program and regular basis. I will reevaluate his symptoms in 6 months.  He is already on good medical therapy for hypertension and hyperlipidemia."   I saw him last month - He was here because of a chest CT that he had for a lung screening. Saw a message on MyChart.  No active symptoms. No chest pain. No chest tightness. Wanted to know what he should do next. Has multiple CV risk factors for CAD. He was referred for Myoview after discussion with both Dr. Caryl Comes and Dr. Burt Knack.   Comes back today. Here alone. His Myoview is reviewed with him. He says he got really short of breath with the study but no chest pain. He plays golf a couple of times a week and is active on his farm but no real exercise program. FH positive for CAD - both parents died in their 52's. Has treated HTN and HLD. Remote smoke. Gender is male. He has cut his Xarelto back to every other day - had  been told by Dr. [name] in the past that he could stop but he has elected to stay on - just taking every other day.   Current Outpatient Prescriptions  Medication Sig Dispense Refill  . Ascorbic Acid (VITAMIN C) 1000 MG tablet Take 1,500 mg by mouth daily. States he takes aout 1000mg  per day      . bisoprolol-hydrochlorothiazide (ZIAC) 10-6.25 MG per tablet Take 1 tablet by mouth daily.       . cholecalciferol (VITAMIN D) 1000 UNITS tablet Take 10,000 Units by mouth daily.      . Multiple Vitamin (MULITIVITAMIN WITH MINERALS) TABS Take 1 tablet by mouth daily. FOR MEN      . Rivaroxaban (XARELTO) 20 MG TABS tablet TAKE ONE TABLET BY MOUTH every other day      . rosuvastatin (CRESTOR) 20 MG tablet Take 20 mg by mouth every evening.        No current facility-administered medications for this visit.    Allergies  Allergen Reactions  . Zetia [Ezetimibe] Other (See Comments)    myalgias    Past Medical History  Diagnosis Date  . Hyperlipidemia   . Mitral regurgitation   . Bladder cancer   . OSA on CPAP   . Hearing loss of both ears   . History of colon polyps     BENIGN  . PAD (peripheral artery disease)     ABI--  RIGHT SIDE NORMAL  LEFT SIDE MODERATELY REDUCED W/ DISTAL LEFT SFA STENOSIS//  MILD CALDICATION  . S/P ablation of atrial fibrillation     07-27-2012  . Anticoagulant long-term use     XARELTO  . Hypertension   . PAF (paroxysmal atrial fibrillation) dx  2007 /   PREVIOUS CARDIOVERTED X3  LAST ONE 07-27-2012  POST  ABLATION  07-27-2012    PRIMARY CARDIOLOGIST-  DR KOWALSKI/   [facility]--  DR ALLRED  . Pre-diabetes   . Vitamin D deficiency     Past Surgical History  Procedure Laterality Date  . Total hip arthroplasty  07/25/2011    Procedure: TOTAL HIP ARTHROPLASTY ANTERIOR APPROACH;  Surgeon: Christopher Y Blackman;  Location: [facility];  Service: Orthopedics;  Laterality: Right;  . Tee without cardioversion  07/26/2012    Procedure: TRANSESOPHAGEAL ECHOCARDIOGRAM  (TEE);  Surgeon: Paula V Ross, MD;  Location: [facility];  Service: Cardiovascular;  Laterality: N/A;  . Cardioversion  08/06/2012    Procedure: CARDIOVERSION;  Surgeon: Paula V Ross, MD;  Location: [facility];  Service: Cardiovascular;  Laterality: N/A;  . Total hip arthroplasty Left 02-08-2010  . Cataract extraction w/ intraocular lens  implant, bilateral    . Cardiac electrophysiology study and ablation  07-27-2012  DR ALLRED    SUCCESSFUL ABLATION OF A-FIB  . Tonsillectomy  age 18  . Cystoscopy w/ retrogrades Bilateral 04/12/2013    Procedure: CYSTOSCOPY WITH RETROGRADE PYELOGRAM;  Surgeon: Theodore Manny, MD;  Location: [facility];  Service: Urology;  Laterality: Bilateral;  . Transurethral resection of bladder tumor with gyrus (turbt-gyrus) N/A 04/12/2013    Procedure: TRANSURETHRAL RESECTION OF BLADDER TUMOR WITH GYRUS (TURBT-GYRUS);  Surgeon: Theodore Manny, MD;  Location: [facility];  Service: Urology;  Laterality: N/A;    History  Smoking status  . Former Smoker -- 2.00 packs/day for 10 years  . Types: Cigarettes  . Quit date: 09/20/1981  Smokeless tobacco  . Never Used    History  Alcohol Use No    Comment: pt states he has stopped drinking alcohol    Family History  Problem Relation Age of Onset  . Heart attack Mother   . Heart failure Father     Review of Systems: The review of systems is per the HPI.  All other systems were reviewed and are negative.  Physical Exam: BP 118/70  Pulse 58  Ht 5' 9.5" (1.765 m)  Wt 208 lb 6.4 oz (94.53 kg)  BMI 30.34 kg/m2 Patient is very pleasant and in no acute distress. Skin is warm and dry. Color is normal.  HEENT is unremarkable. Normocephalic/atraumatic. PERRL. Sclera are nonicteric. Neck is supple. No masses. No JVD. Lungs are clear. Cardiac exam shows a regular rate and rhythm. Abdomen is soft. Extremities are without edema. Gait and ROM are intact. No gross neurologic deficits  noted.  LABORATORY DATA: EKG shows sinus brady and is normal.   Lab Results  Component Value Date   WBC 7.2 09/22/2013   HGB 16.1 09/22/2013   HCT 45.6 09/22/2013   PLT 225 09/22/2013   GLUCOSE 96 09/22/2013   CHOL 153 09/22/2013   TRIG 61 09/22/2013   HDL 58 09/22/2013   LDLCALC 83 09/22/2013   ALT 22 09/22/2013   AST 18 09/22/2013   NA 139 09/22/2013   K 4.1 09/22/2013   CL 103 09/22/2013   CREATININE 0.90 09/22/2013   BUN 16 09/22/2013   CO2 28 09/22/2013   TSH 0.802 06/22/2013   INR   1.19 07/11/2011   HGBA1C 5.7* 09/22/2013      Myoview Overall Impression: Intermediate risk stress nuclear study with fixed inferior bowel attenuation artifact. and abnormal treadmill EKG findings with ST depression, chest pain and Duke Score of -2. Clinical correlation is advised.  LV Ejection Fraction: 55%. LV Wall Motion: NL LV Function; NL Wall Motion  Kenneth C. Hilty, MD, FACC  Board Certified in Nuclear Cardiology  Attending Cardiologist  [facility]  CT CHEST FINDINGS: 7 mm nodule in the left lower lobe on image 43 is stable since prior study. Small clustered nodules peripherally in the left lower lobe on image 40 are also stable. 4 mm nodule in the right middle lobe on image 37. This area was not definitively seen on the prior abdominal CT. Linear densities and ground-glass opacities in the lingula likely reflects scarring, stable.  3 mm nodule in the right lower lobe on image 31.  Densely calcified coronary arteries. Insert Heart aortic arch and descending thoracic aorta calcified. No mediastinal, hilar, or axillary adenopathy. Chest wall soft tissues are unremarkable.  Imaging into the upper abdomen demonstrates calcifications in the liver and spleen compatible with old granulomatous disease.  No acute bony abnormality.  IMPRESSION: Stable nodules in the left lower lobe. There are also small nodules in the right middle lobe and right lower lobe which were not imaged on the prior CT abdomen.  Recommend additional follow-up study in 12 months to assess stability.  Severe coronary artery disease.  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   Electronically Signed By: Kevin Dover M.D. On: 10/14/2013 14:19   Assessment / Plan: 1. PAF - prior ablation - remains on his Xarelto (but taking every other day - probably not beneficial) and remains in sinus.   2. Abnormal chest CT - as noted above.  3. Intermediate Myoview in a patient with multiple CV risk factors - discussed option of continued medical management and CV risk factor modification versus cardiac cath - patient is wanting to proceed with cardiac cath - discussed with Dr. Allred who is in agreement.   Patient is agreeable to this plan and will call if any problems develop in the interim.   Ashara Lounsbury C. Consuelo Suthers, RN, ANP-C  [facility]  [address]  [place], [place] [address]  [phone]  

## 2013-11-25 DIAGNOSIS — I200 Unstable angina: Secondary | ICD-10-CM

## 2013-11-25 DIAGNOSIS — I2510 Atherosclerotic heart disease of native coronary artery without angina pectoris: Secondary | ICD-10-CM | POA: Diagnosis present

## 2013-11-25 LAB — BASIC METABOLIC PANEL
BUN: 15 mg/dL (ref 6–23)
CHLORIDE: 106 meq/L (ref 96–112)
CO2: 23 meq/L (ref 19–32)
Calcium: 8.8 mg/dL (ref 8.4–10.5)
Creatinine, Ser: 0.88 mg/dL (ref 0.50–1.35)
GFR calc non Af Amer: 86 mL/min — ABNORMAL LOW (ref >90)
Glucose, Bld: 100 mg/dL — ABNORMAL HIGH (ref 70–99)
POTASSIUM: 3.6 meq/L — AB (ref 3.7–5.3)
SODIUM: 144 meq/L (ref 137–147)

## 2013-11-25 LAB — CBC
HCT: 39.6 % (ref 39.0–52.0)
Hemoglobin: 13.6 g/dL (ref 13.0–17.0)
MCH: 29.9 pg (ref 26.0–34.0)
MCHC: 34.3 g/dL (ref 30.0–36.0)
MCV: 87 fL (ref 78.0–100.0)
PLATELETS: 174 10*3/uL (ref 150–400)
RBC: 4.55 MIL/uL (ref 4.22–5.81)
RDW: 13.4 % (ref 11.5–15.5)
WBC: 5.4 10*3/uL (ref 4.0–10.5)

## 2013-11-25 MED ORDER — ASPIRIN 81 MG PO CHEW: 81.0000 mg | CHEWABLE_TABLET | Freq: Every day | ORAL | Status: DC

## 2013-11-25 MED ORDER — CLOPIDOGREL BISULFATE 75 MG PO TABS: 75.0000 mg | ORAL_TABLET | Freq: Every day | ORAL | Status: DC

## 2013-11-25 MED ORDER — NITROGLYCERIN 0.4 MG SL SUBL: 0.4000 mg | SUBLINGUAL_TABLET | SUBLINGUAL | Status: DC | PRN

## 2013-11-25 NOTE — Progress Notes (Signed)
CARDIAC REHAB PHASE I   PRE:  Rate/Rhythm: 53 SB  BP:  Supine: 117/58  Sitting:   Standing:    SaO2:   MODE:  Ambulation: 1000 ft   POST:  Rate/Rhythm: 68 SR  BP:  Supine:   Sitting: 111/60  Standing:    SaO2:  5366-4403 Pt tolerated ambulation well without c/o of pain or SOB. VS stable Pt to bed after walk with call light in reach. Completed PCI discharge education with pt. He voices understanding. Encouraged pt to not do anything strenuous over the weekend. We will follow pt on his return procedure as ordered. We did discuss Outpt. CRP and I gave information on the program.  Rodney Langton RN 11/25/2013 9:20 AM

## 2013-11-25 NOTE — Discharge Summary (Signed)
   Patient ID: Damon [name],  MRN: [id number], DOB/AGE: 69/17/1947 67 y.o.  Admit date: 11/24/2013 Discharge date: 11/25/2013  Primary Care Provider: Dr McKeown Primary Cardiologist: Dr Alred  Discharge Diagnoses Active Problems:   CAD- CFX PTCA 11/24/13 with plans for DES 11/28/13   Abnormal cardiovascular stress test   Atrial fibrillation   Chronic anticoagulation- Xarelto stopped for PCI   Hypertension   Sleep apnea   Peripheral arterial disease   Mixed hyperlipidemia    Procedures: Coronary angiogram, PTCA of CFX 11/24/13   Hospital Course: Pt is a 67 year old male with prior AF ablation followed by Dr Alred. He has multiple risk factors for CAD including HLD,  PAD, a family history of CAD, and HTN. He had a screening chest CT as an OP in March that showed "severe CAD". He has no angina history. Myoview as an OP 11/01/13 was abnormal with chest pain and ST depression with stress. It was decided to proceed with diagnostic cath. This was done 11/24/13 and revealed severe single vessel CAD with heavily calcified stenosis in the proximal Circumflex, likely culprit lesion for unstable angina. He underwent PTCA of the CFX on 11/24/13. He also had moderate LAD stenosis with FFR demonstrating adequate flow down vessel and normal LV systolic function. The plan is to continue ASA and Plavix, (no Xarelto) and bring back in Monday 5/11 for staged PCI with rotablator atherectomy of the proximal Circumflex. He was seen the morning of the 8th and felt to be stable for discharge.      Discharge Vitals:  Blood pressure 117/58, pulse 48, temperature 98.3 F (36.8 C), temperature source Oral, resp. rate 16, height 5' 9.5" (1.765 m), weight 209 lb 14.1 oz (95.2 kg), SpO2 95.00%.    Labs: Results for orders placed during the hospital encounter of 11/24/13 (from the past 48 hour(s))  POCT ACTIVATED CLOTTING TIME     Status: None   Collection Time    11/24/13  9:37 AM      Result Value Ref Range   Activated Clotting Time 254    POCT ACTIVATED CLOTTING TIME     Status: None   Collection Time    11/24/13  9:58 AM      Result Value Ref Range   Activated Clotting Time 570    POCT ACTIVATED CLOTTING TIME     Status: None   Collection Time    11/24/13 10:46 AM      Result Value Ref Range   Activated Clotting Time >1000    CBC     Status: None   Collection Time    11/25/13  4:47 AM      Result Value Ref Range   WBC 5.4  4.0 - 10.5 K/uL   RBC 4.55  4.22 - 5.81 MIL/uL   Hemoglobin 13.6  13.0 - 17.0 g/dL   HCT 39.6  39.0 - 52.0 %   MCV 87.0  78.0 - 100.0 fL   MCH 29.9  26.0 - 34.0 pg   MCHC 34.3  30.0 - 36.0 g/dL   RDW 13.4  11.5 - 15.5 %   Platelets 174  150 - 400 K/uL  BASIC METABOLIC PANEL     Status: Abnormal   Collection Time    11/25/13  4:47 AM      Result Value Ref Range   Sodium 144  137 - 147 mEq/L   Potassium 3.6 (*) 3.7 - 5.3 mEq/L   Chloride 106  96 -   112 mEq/L   CO2 23  19 - 32 mEq/L   Glucose, Bld 100 (*) 70 - 99 mg/dL   BUN 15  6 - 23 mg/dL   Creatinine, Ser 0.88  0.50 - 1.35 mg/dL   Calcium 8.8  8.4 - 10.5 mg/dL   GFR calc non Af Amer 86 (*) >90 mL/min   GFR calc Af Amer >90  >90 mL/min   Comment: (NOTE)     The eGFR has been calculated using the CKD EPI equation.     This calculation has not been validated in all clinical situations.     eGFR's persistently <90 mL/min signify possible Chronic Kidney     Disease.    Disposition:      Follow-up Information   Follow up with Lauree Chandler, MD. (PCI Monday- arrive 11 am)    Specialty:  Cardiology   Contact information:   [address] [place] [address] [phone]       Discharge Medications:    Medication List    STOP taking these medications       rivaroxaban 20 MG Tabs tablet  Commonly known as:  XARELTO      TAKE these medications       aspirin 81 MG chewable tablet  Chew 1 tablet (81 mg total) by mouth daily.     bisoprolol-hydrochlorothiazide 10-6.25  MG per tablet  Commonly known as:  ZIAC  Take 1 tablet by mouth daily.     cholecalciferol 1000 UNITS tablet  Commonly known as:  VITAMIN D  Take 5,000 Units by mouth daily.     clopidogrel 75 MG tablet  Commonly known as:  PLAVIX  Take 1 tablet (75 mg total) by mouth daily with breakfast.     multivitamin with minerals Tabs tablet  Take 1 tablet by mouth daily. FOR MEN     nitroGLYCERIN 0.4 MG SL tablet  Commonly known as:  NITROSTAT  Place 1 tablet (0.4 mg total) under the tongue every 5 (five) minutes as needed for chest pain.     rosuvastatin 20 MG tablet  Commonly known as:  CRESTOR  Take 20 mg by mouth every evening.     sodium chloride 5 % ophthalmic solution  Commonly known as:  MURO 128  Place 1 drop into both eyes every 4 (four) hours as needed for irritation.     vitamin C 1000 MG tablet  Take 1,000 mg by mouth daily.         Duration of Discharge Encounter: Greater than 30 minutes including physician time.  Angelena Form PA-C 11/25/2013 8:47 AM

## 2013-11-25 NOTE — Progress Notes (Signed)
     SUBJECTIVE: No chest pain or SOB.   BP 114/66  Pulse 50  Temp(Src) 98.2 F (36.8 C) (Oral)  Resp 18  Ht 5' 9.5" (1.765 m)  Wt 209 lb 14.1 oz (95.2 kg)  BMI 30.56 kg/m2  SpO2 94%  Intake/Output Summary (Last 24 hours) at 11/25/13 0706 Last data filed at 11/24/13 2045  Gross per 24 hour  Intake  907.5 ml  Output   1000 ml  Net  -92.5 ml    PHYSICAL EXAM General: Well developed, well nourished, in no acute distress. Alert and oriented x 3.  Psych:  Good affect, responds appropriately Neck: No JVD. No masses noted.  Lungs: Clear bilaterally with no wheezes or rhonci noted.  Heart: RRR with no murmurs noted. Abdomen: Bowel sounds are present. Soft, non-tender.  Extremities: No lower extremity edema.   LABS: Basic Metabolic Panel:  Recent Labs  11/25/13 0447  NA 144  K 3.6*  CL 106  CO2 23  GLUCOSE 100*  BUN 15  CREATININE 0.88  CALCIUM 8.8   CBC:  Recent Labs  11/25/13 0447  WBC 5.4  HGB 13.6  HCT 39.6  MCV 87.0  PLT 174    Current Meds: . adenosine  16 mL Intravenous Once  . aspirin  81 mg Oral Daily  . atorvastatin  40 mg Oral q1800  . bisoprolol-hydrochlorothiazide  1 tablet Oral Daily  . clopidogrel  75 mg Oral Q breakfast     ASSESSMENT AND PLAN:  1. Unstable angina/CAD: Pt with recent fatigue and dyspnea on exertion c/w class III unstable angina, abnormal stress test.  Cardiac cath with severe stenosis ostial/proximal Circumflex and moderate disease mid LAD. FFR of LAD showed that the LAD lesion is not flow limiting. Attempted PCI yesterday of Circumflex but could not place stent due to calcification of vessel. I did perform balloon angioplasty of the Circumflex. Will bring back next Monday for rotablator atherectomy of the Circumflex at 1pm. Continue ASA, Plavix, beta blocker, statin. Will not restart Xarelto until after PCI next week. Discharge home today. I will place cath orders. Pt should be at short stay next week at 11am. Same  procedure for parking etc as yesterday.   [name]  5/8/20157:06 AM

## 2013-11-25 NOTE — Discharge Instructions (Signed)
Coronary Angioplasty Coronary angioplasty is a procedure to widen a narrowed or blocked blood vessel of the heart (coronary arteries). The artery is usually blocked by cholesterol buildup (plaque) in the lining or walls. When a vessel in the heart becomes partially blocked, there is decreased blood flow to that area. This may lead to chest pain or a heart attack (myocardial infarction).  LET YOUR CAREGIVER KNOW ABOUT:  Any allergies you have, including allergies to shellfish or contrast dye.  All medicines you are taking, including vitamins, herbs, eye drops, creams, and over-the-counter medicines.  Previous problems you or members of your family have had with the use of anesthetics.  Any blood disorders you have.  Previous surgeries you have had.  Any previous kidney problems or failure you have had.  Medical conditions you have.  Possibility of pregnancy, if this applies. RISKS AND COMPLICATIONS Generally, this is a safe procedure. However, as with any procedure, problems can occur. Possible problems include:   Injury to the blood vessels, including rupture or bleeding.   Infection or bruising at the catheter site.   Allergic reaction to the dye or contrast used.   Kidney damage from the dye or contrast used.   Blood clots that can lead to a stroke or heart attack.  BEFORE THE PROCEDURE  Do not eat or drink after midnight on the night before the procedure up until the procedure or as directed by your health care provider.   You may drink enough water to take your medicines the morning of the procedure, if you were instructed to do so.  PROCEDURE  You may be given a medicine to help you relax (sedative) before and during the procedure through an IV tube.   The area where the catheter will be inserted will be washed and shaved. This is usually done in the groin but may be done in the fold of your arm (near your elbow) or in the wrist.   A medicine will be given to  numb the area where the catheter will be inserted (local anesthetic).   The catheter will be inserted into an artery using a guide wire. The catheter is guided to the location of the narrowed or blocked artery with a type of X-ray called fluoroscopy.   Dye is then injected and X-rays are taken. The dye helps to show where any narrowing or blockages are located.   Once positioned at the narrowed or blocked portion of the blood vessel, a balloon is inflated to make the artery wider. Expanding the balloon crushes the plaque into the wall of the vessel and improves the blood flow.   Sometimes the artery may be made wider by removing plaque using a laser or other tools.   When the blood flow is better, the balloon is deflated and the catheter is removed.   The catheter is removed and a special dressing is placed over the insertion site. AFTER THE PROCEDURE  If the procedure is done through the leg, you will be kept in bed lying flat for 6 hours. You will be told to not bend or cross your legs.   The insertion site will be checked often.   The pulse in your feet or wrist will be checked often.   Additional blood tests, X-rays, and an electrocardiogram (or electrocardiography) may be done.   You may need to stay in the hospital overnight for monitoring.  Document Released: 07/04/2000 Document Revised: 04/27/2013 Document Reviewed: 03/14/2013 [facility] Patient Information 2014 [place].

## 2013-11-25 NOTE — Discharge Summary (Signed)
See full note this am. cdm 

## 2013-11-27 NOTE — Addendum Note (Signed)
Addended by: Lauree Chandler D on: 11/27/2013 12:26 PM   Modules accepted: Orders

## 2013-11-28 ENCOUNTER — Other Ambulatory Visit: Payer: Self-pay

## 2013-11-28 ENCOUNTER — Encounter (HOSPITAL_COMMUNITY)
Admission: AD | Disposition: A | Discharge status: Home / Self Care | Payer: Medicare Other | Source: Ambulatory Visit | Attending: Cardiovascular Disease

## 2013-11-28 ENCOUNTER — Ambulatory Visit (HOSPITAL_COMMUNITY)
Admission: AD | Admit: 2013-11-28 | Discharge: 2013-11-29 | Disposition: A | Discharge status: Home / Self Care | Payer: Medicare Other | Source: Ambulatory Visit | Attending: Cardiovascular Disease | Admitting: Cardiovascular Disease

## 2013-11-28 DIAGNOSIS — I10 Essential (primary) hypertension: Secondary | ICD-10-CM

## 2013-11-28 DIAGNOSIS — E785 Hyperlipidemia, unspecified: Secondary | ICD-10-CM | POA: Insufficient documentation

## 2013-11-28 DIAGNOSIS — I70219 Atherosclerosis of native arteries of extremities with intermittent claudication, unspecified extremity: Secondary | ICD-10-CM | POA: Insufficient documentation

## 2013-11-28 DIAGNOSIS — Z87891 Personal history of nicotine dependence: Secondary | ICD-10-CM | POA: Insufficient documentation

## 2013-11-28 DIAGNOSIS — R9439 Abnormal result of other cardiovascular function study: Secondary | ICD-10-CM

## 2013-11-28 DIAGNOSIS — I200 Unstable angina: Secondary | ICD-10-CM

## 2013-11-28 DIAGNOSIS — H9190 Unspecified hearing loss, unspecified ear: Secondary | ICD-10-CM | POA: Insufficient documentation

## 2013-11-28 DIAGNOSIS — E782 Mixed hyperlipidemia: Secondary | ICD-10-CM

## 2013-11-28 DIAGNOSIS — G4733 Obstructive sleep apnea (adult) (pediatric): Secondary | ICD-10-CM | POA: Insufficient documentation

## 2013-11-28 DIAGNOSIS — I4891 Unspecified atrial fibrillation: Secondary | ICD-10-CM | POA: Insufficient documentation

## 2013-11-28 DIAGNOSIS — I2510 Atherosclerotic heart disease of native coronary artery without angina pectoris: Secondary | ICD-10-CM

## 2013-11-28 DIAGNOSIS — Z860100 Personal history of colon polyps, unspecified: Secondary | ICD-10-CM | POA: Insufficient documentation

## 2013-11-28 DIAGNOSIS — I059 Rheumatic mitral valve disease, unspecified: Secondary | ICD-10-CM | POA: Insufficient documentation

## 2013-11-28 DIAGNOSIS — R7309 Other abnormal glucose: Secondary | ICD-10-CM | POA: Insufficient documentation

## 2013-11-28 DIAGNOSIS — E559 Vitamin D deficiency, unspecified: Secondary | ICD-10-CM | POA: Insufficient documentation

## 2013-11-28 DIAGNOSIS — Z96649 Presence of unspecified artificial hip joint: Secondary | ICD-10-CM | POA: Insufficient documentation

## 2013-11-28 DIAGNOSIS — Z8551 Personal history of malignant neoplasm of bladder: Secondary | ICD-10-CM | POA: Insufficient documentation

## 2013-11-28 DIAGNOSIS — Z7901 Long term (current) use of anticoagulants: Secondary | ICD-10-CM | POA: Insufficient documentation

## 2013-11-28 HISTORY — PX: PERCUTANEOUS CORONARY ROTOBLATOR INTERVENTION (PCI-R): SHX5484

## 2013-11-28 LAB — POCT ACTIVATED CLOTTING TIME: ACTIVATED CLOTTING TIME: 581 s

## 2013-11-28 SURGERY — PERCUTANEOUS CORONARY ROTOBLATOR INTERVENTION (PCI-R)
Anesthesia: LOCAL

## 2013-11-28 MED ORDER — SODIUM CHLORIDE 0.9 % IJ SOLN: 3.0000 mL | INTRAMUSCULAR | Status: DC | PRN

## 2013-11-28 MED ORDER — NITROGLYCERIN 0.4 MG SL SUBL: 0.4000 mg | SUBLINGUAL_TABLET | SUBLINGUAL | Status: DC | PRN

## 2013-11-28 MED ORDER — FENTANYL CITRATE 0.05 MG/ML IJ SOLN
INTRAMUSCULAR | Status: AC
  Filled 2013-11-28: qty 2

## 2013-11-28 MED ORDER — LIDOCAINE HCL (PF) 1 % IJ SOLN
INTRAMUSCULAR | Status: AC
  Filled 2013-11-28: qty 30

## 2013-11-28 MED ORDER — VERAPAMIL HCL 2.5 MG/ML IV SOLN
INTRAVENOUS | Status: AC
Start: 2013-11-28 — End: 2013-11-28
  Filled 2013-11-28: qty 2

## 2013-11-28 MED ORDER — SODIUM CHLORIDE 0.9 % IJ SOLN: 3.0000 mL | Freq: Two times a day (BID) | INTRAMUSCULAR | Status: DC

## 2013-11-28 MED ORDER — LORAZEPAM 0.5 MG PO TABS
0.5000 mg | ORAL_TABLET | ORAL | Status: DC | PRN
Start: 2013-11-28 — End: 2013-11-29
  Administered 2013-11-28: 0.5 mg via ORAL
  Filled 2013-11-28: qty 1

## 2013-11-28 MED ORDER — SODIUM CHLORIDE 0.9 % IV SOLN
INTRAVENOUS | Status: AC
  Administered 2013-11-28: 16:00:00 via INTRAVENOUS

## 2013-11-28 MED ORDER — HEPARIN SODIUM (PORCINE) 1000 UNIT/ML IJ SOLN
INTRAMUSCULAR | Status: AC
  Filled 2013-11-28: qty 1

## 2013-11-28 MED ORDER — ATORVASTATIN CALCIUM 40 MG PO TABS
40.0000 mg | ORAL_TABLET | Freq: Every day | ORAL | Status: DC
  Filled 2013-11-28 (×2): qty 1

## 2013-11-28 MED ORDER — CLOPIDOGREL BISULFATE 75 MG PO TABS
75.0000 mg | ORAL_TABLET | Freq: Every day | ORAL | Status: DC
  Administered 2013-11-29: 75 mg via ORAL
  Filled 2013-11-28: qty 1

## 2013-11-28 MED ORDER — VERAPAMIL HCL 2.5 MG/ML IV SOLN
INTRAVENOUS | Status: AC
  Filled 2013-11-28: qty 2

## 2013-11-28 MED ORDER — NITROGLYCERIN 0.2 MG/ML ON CALL CATH LAB
INTRAVENOUS | Status: AC
  Filled 2013-11-28: qty 1

## 2013-11-28 MED ORDER — DIAZEPAM 5 MG PO TABS: 5.0000 mg | ORAL_TABLET | Freq: Four times a day (QID) | ORAL | Status: DC | PRN

## 2013-11-28 MED ORDER — HEPARIN (PORCINE) IN NACL 2-0.9 UNIT/ML-% IJ SOLN
INTRAMUSCULAR | Status: AC
  Filled 2013-11-28: qty 1000

## 2013-11-28 MED ORDER — MIDAZOLAM HCL 2 MG/2ML IJ SOLN
INTRAMUSCULAR | Status: AC
  Filled 2013-11-28: qty 2

## 2013-11-28 MED ORDER — BIVALIRUDIN 250 MG IV SOLR
INTRAVENOUS | Status: AC
  Filled 2013-11-28: qty 250

## 2013-11-28 MED ORDER — ASPIRIN 81 MG PO CHEW
81.0000 mg | CHEWABLE_TABLET | Freq: Every day | ORAL | Status: DC
  Filled 2013-11-28 (×2): qty 1

## 2013-11-28 MED ORDER — ASPIRIN 81 MG PO CHEW
CHEWABLE_TABLET | ORAL | Status: AC
  Filled 2013-11-28: qty 1

## 2013-11-28 MED ORDER — BISOPROLOL-HYDROCHLOROTHIAZIDE 10-6.25 MG PO TABS
1.0000 | ORAL_TABLET | Freq: Every day | ORAL | Status: DC
  Filled 2013-11-28 (×2): qty 1

## 2013-11-28 MED ORDER — DIAZEPAM 5 MG PO TABS
ORAL_TABLET | ORAL | Status: AC
  Administered 2013-11-28: 5 mg
  Filled 2013-11-28: qty 1

## 2013-11-28 MED ORDER — ASPIRIN 81 MG PO CHEW
81.0000 mg | CHEWABLE_TABLET | ORAL | Status: AC
  Administered 2013-11-28: 81 mg via ORAL

## 2013-11-28 MED ORDER — SODIUM CHLORIDE 0.9 % IV SOLN: 250.0000 mL | INTRAVENOUS | Status: DC | PRN

## 2013-11-28 MED ORDER — SODIUM CHLORIDE 0.9 % IV SOLN
INTRAVENOUS | Status: DC
  Administered 2013-11-28: 12:00:00 via INTRAVENOUS

## 2013-11-28 NOTE — H&P (View-Only) (Signed)
Damon [name] Date of Birth: 03-Jan-1946 Medical Record #[id number]  History of Present Illness: Damon [name] is seen back today for a follow up visit. Seen for Dr. [name]. He is a 68 year old male with prior AF ablation, HLD, MR, OSA on CPAP, PAD and HTN.   Last seen here in February of 2015. Felt to be doing ok but noted to have claudication in the past. Referred to Dr. Fletcher Anon. His recommendations were as follows:  "The patient has evidence of underlying peripheral arterial disease with mild non-lifestyle limiting claudication. His ABI was normal on the right side moderately reduced on the left side with evidence of significant left SFA stenosis. Given that he is a nonsmoker and is not diabetic, I think the chance of progression is overall low. I suspect that his claudication will remain mild.  I discussed management options including medical therapy of risk factors versus proceeding with angiography and possible endovascular intervention. I recommend keeping the second option only for refractory symptoms. I advised him to start a walking program and regular basis. I will reevaluate his symptoms in 6 months.  He is already on good medical therapy for hypertension and hyperlipidemia."   I saw him last month - He was here because of a chest CT that he had for a lung screening. Saw a message on MyChart.  No active symptoms. No chest pain. No chest tightness. Wanted to know what he should do next. Has multiple CV risk factors for CAD. He was referred for Myoview after discussion with both Dr. Caryl Comes and Dr. Burt Knack.   Comes back today. Here alone. His Myoview is reviewed with him. He says he got really short of breath with the study but no chest pain. He plays golf a couple of times a week and is active on his farm but no real exercise program. FH positive for CAD - both parents died in their 52's. Has treated HTN and HLD. Remote smoke. Gender is male. He has cut his Xarelto back to every other day - had  been told by Dr. [name] in the past that he could stop but he has elected to stay on - just taking every other day.   Current Outpatient Prescriptions  Medication Sig Dispense Refill  . Ascorbic Acid (VITAMIN C) 1000 MG tablet Take 1,500 mg by mouth daily. States he takes aout 1000mg  per day      . bisoprolol-hydrochlorothiazide (ZIAC) 10-6.25 MG per tablet Take 1 tablet by mouth daily.       . cholecalciferol (VITAMIN D) 1000 UNITS tablet Take 10,000 Units by mouth daily.      . Multiple Vitamin (MULITIVITAMIN WITH MINERALS) TABS Take 1 tablet by mouth daily. FOR MEN      . Rivaroxaban (XARELTO) 20 MG TABS tablet TAKE ONE TABLET BY MOUTH every other day      . rosuvastatin (CRESTOR) 20 MG tablet Take 20 mg by mouth every evening.        No current facility-administered medications for this visit.    Allergies  Allergen Reactions  . Zetia [Ezetimibe] Other (See Comments)    myalgias    Past Medical History  Diagnosis Date  . Hyperlipidemia   . Mitral regurgitation   . Bladder cancer   . OSA on CPAP   . Hearing loss of both ears   . History of colon polyps     BENIGN  . PAD (peripheral artery disease)     ABI--  RIGHT SIDE NORMAL  LEFT SIDE MODERATELY REDUCED W/ DISTAL LEFT SFA STENOSIS//  MILD CALDICATION  . S/P ablation of atrial fibrillation     07-27-2012  . Anticoagulant long-term use     XARELTO  . Hypertension   . PAF (paroxysmal atrial fibrillation) dx  2007 /   PREVIOUS CARDIOVERTED X3  LAST ONE 07-27-2012  POST  ABLATION  07-27-2012    PRIMARY CARDIOLOGIST-  DR KOWALSKI/   [facility]--  DR ALLRED  . Pre-diabetes   . Vitamin D deficiency     Past Surgical History  Procedure Laterality Date  . Total hip arthroplasty  07/25/2011    Procedure: TOTAL HIP ARTHROPLASTY ANTERIOR APPROACH;  Surgeon: Christopher Y Blackman;  Location: [facility];  Service: Orthopedics;  Laterality: Right;  . Tee without cardioversion  07/26/2012    Procedure: TRANSESOPHAGEAL ECHOCARDIOGRAM  (TEE);  Surgeon: Paula V Ross, MD;  Location: [facility];  Service: Cardiovascular;  Laterality: N/A;  . Cardioversion  08/06/2012    Procedure: CARDIOVERSION;  Surgeon: Paula V Ross, MD;  Location: [facility];  Service: Cardiovascular;  Laterality: N/A;  . Total hip arthroplasty Left 02-08-2010  . Cataract extraction w/ intraocular lens  implant, bilateral    . Cardiac electrophysiology study and ablation  07-27-2012  DR ALLRED    SUCCESSFUL ABLATION OF A-FIB  . Tonsillectomy  age 18  . Cystoscopy w/ retrogrades Bilateral 04/12/2013    Procedure: CYSTOSCOPY WITH RETROGRADE PYELOGRAM;  Surgeon: Theodore Manny, MD;  Location: [facility];  Service: Urology;  Laterality: Bilateral;  . Transurethral resection of bladder tumor with gyrus (turbt-gyrus) N/A 04/12/2013    Procedure: TRANSURETHRAL RESECTION OF BLADDER TUMOR WITH GYRUS (TURBT-GYRUS);  Surgeon: Theodore Manny, MD;  Location: [facility];  Service: Urology;  Laterality: N/A;    History  Smoking status  . Former Smoker -- 2.00 packs/day for 10 years  . Types: Cigarettes  . Quit date: 09/20/1981  Smokeless tobacco  . Never Used    History  Alcohol Use No    Comment: pt states he has stopped drinking alcohol    Family History  Problem Relation Age of Onset  . Heart attack Mother   . Heart failure Father     Review of Systems: The review of systems is per the HPI.  All other systems were reviewed and are negative.  Physical Exam: BP 118/70  Pulse 58  Ht 5' 9.5" (1.765 m)  Wt 208 lb 6.4 oz (94.53 kg)  BMI 30.34 kg/m2 Patient is very pleasant and in no acute distress. Skin is warm and dry. Color is normal.  HEENT is unremarkable. Normocephalic/atraumatic. PERRL. Sclera are nonicteric. Neck is supple. No masses. No JVD. Lungs are clear. Cardiac exam shows a regular rate and rhythm. Abdomen is soft. Extremities are without edema. Gait and ROM are intact. No gross neurologic deficits  noted.  LABORATORY DATA: EKG shows sinus brady and is normal.   Lab Results  Component Value Date   WBC 7.2 09/22/2013   HGB 16.1 09/22/2013   HCT 45.6 09/22/2013   PLT 225 09/22/2013   GLUCOSE 96 09/22/2013   CHOL 153 09/22/2013   TRIG 61 09/22/2013   HDL 58 09/22/2013   LDLCALC 83 09/22/2013   ALT 22 09/22/2013   AST 18 09/22/2013   NA 139 09/22/2013   K 4.1 09/22/2013   CL 103 09/22/2013   CREATININE 0.90 09/22/2013   BUN 16 09/22/2013   CO2 28 09/22/2013   TSH 0.802 06/22/2013   INR   1.19 07/11/2011   HGBA1C 5.7* 09/22/2013      Myoview Overall Impression: Intermediate risk stress nuclear study with fixed inferior bowel attenuation artifact. and abnormal treadmill EKG findings with ST depression, chest pain and Duke Score of -2. Clinical correlation is advised.  LV Ejection Fraction: 55%. LV Wall Motion: NL LV Function; NL Wall Motion  Kenneth C. Hilty, MD, FACC  Board Certified in Nuclear Cardiology  Attending Cardiologist  [facility]  CT CHEST FINDINGS: 7 mm nodule in the left lower lobe on image 43 is stable since prior study. Small clustered nodules peripherally in the left lower lobe on image 40 are also stable. 4 mm nodule in the right middle lobe on image 37. This area was not definitively seen on the prior abdominal CT. Linear densities and ground-glass opacities in the lingula likely reflects scarring, stable.  3 mm nodule in the right lower lobe on image 31.  Densely calcified coronary arteries. Insert Heart aortic arch and descending thoracic aorta calcified. No mediastinal, hilar, or axillary adenopathy. Chest wall soft tissues are unremarkable.  Imaging into the upper abdomen demonstrates calcifications in the liver and spleen compatible with old granulomatous disease.  No acute bony abnormality.  IMPRESSION: Stable nodules in the left lower lobe. There are also small nodules in the right middle lobe and right lower lobe which were not imaged on the prior CT abdomen.  Recommend additional follow-up study in 12 months to assess stability.  Severe coronary artery disease.  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   Electronically Signed By: Kevin Dover M.D. On: 10/14/2013 14:19   Assessment / Plan: 1. PAF - prior ablation - remains on his Xarelto (but taking every other day - probably not beneficial) and remains in sinus.   2. Abnormal chest CT - as noted above.  3. Intermediate Myoview in a patient with multiple CV risk factors - discussed option of continued medical management and CV risk factor modification versus cardiac cath - patient is wanting to proceed with cardiac cath - discussed with Dr. Allred who is in agreement.   Patient is agreeable to this plan and will call if any problems develop in the interim.   Steel Kerney C. Evania Lyne, RN, ANP-C  [facility]  [address]  [place], [place] [address]  [phone]  

## 2013-11-28 NOTE — Progress Notes (Signed)
Site area: right groin  Site Prior to Removal:  Level 0  Pressure Applied For 10 MINUTES    Minutes Beginning at 1750  Manual:   yes  Patient Status During Pull:  stable  Post Pull Groin Site:  Level 0  Post Pull Instructions Given:  yes  Post Pull Pulses Present:  yes  Dressing Applied:  yes  Comments:

## 2013-11-28 NOTE — Interval H&P Note (Signed)
History and Physical Interval Note:  11/28/2013 1:11 PM  Damon [name]  has presented today for cardiac cath with the diagnosis of CAD, unstable angina.  The various methods of treatment have been discussed with the patient and family. After consideration of risks, benefits and other options for treatment, the patient has consented to  Procedure(s): PERCUTANEOUS CORONARY ROTOBLATOR INTERVENTION (PCI-R) (N/A) as a surgical intervention .  The patient's history has been reviewed, patient examined, no change in status, stable for surgery.  I have reviewed the patient's chart and labs.  Questions were answered to the patient's satisfaction.    Cath Lab Visit (complete for each Cath Lab visit)  Clinical Evaluation Leading to the Procedure:   ACS: no  Non-ACS:    Anginal Classification: CCS III  Anti-ischemic medical therapy: Minimal Therapy (1 class of medications)  Non-Invasive Test Results: Intermediate-risk stress test findings: cardiac mortality 1-3%/year  Prior CABG: No previous CABG        [name]

## 2013-11-28 NOTE — CV Procedure (Addendum)
     Cardiac Catheterization Operative Report  Damon [name] [id number] 5/11/20152:53 PM MCKEOWN,WILLIAM DAVID, MD  Procedure Performed:  1. Rotablator atherectomy circumflex 2. PTCA/DES x 1 proximal Circumflex 3. Angioseal RFA  Operator: Lauree Chandler, MD  Indication: 68 yo male with history of HTN, CAD with recent dyspnea and fatigue c/w class III angina. Stress myoview with ischemia, intermediate risk. Cath last week with severe stenosis proximal Circumflex and attempted PCI of Circumflex, unable to deliver stent.                                    Procedure Details: The risks, benefits, complications, treatment options, and expected outcomes were discussed with the patient. The patient and/or family concurred with the proposed plan, giving informed consent. The patient was brought to the cath lab after IV hydration was begun and oral premedication was given. The patient was further sedated with Versed and Fentanyl. The right groin was prepped and draped in the usual manner. Using the modified Seldinger access technique, a 7 French sheath was placed in the right femoral artery. A 6 French sheath was placed in the right femoral vein. He had been loaded with Plavix. He was given a weight based bolus of Angiomax and a drip was started. I then engaged the left main with a CLS 3.5 7Frguide. A temporary pacing wire was placed in the RV. When the ACT was over 200, I passed a rotafloppy wire down the Circumflex. I then made 4 runs with a 1.5 mm rotablator burr. A 2.75 x 8 mm [place] balloon was inflated x 2 in the proximal vessel. I then carefully positioned and deployed a 3.0 x 12 mm Resolute Integrity DES in the proximal Circumflex. The stent was post-dilated with a 3.25 x 8 mm [place] balloon x 1. The stenosis was taken from 99% down to 0%.   There were no immediate complications. The patient was taken to the recovery area in stable condition.   Hemodynamic Findings: Central aortic pressure:  98/30  Impression: 1. Unstable angina (class III) 2. Severe, calcified stenosis proximal Circumflex 3. Successful rotablator atherectomy followed by PTCA/DES proximal Circumflex  Recommendations: He will need to be continued on ASA and Plavix. He had been on Xarelto following atrial fibrillation ablation but has been told by Dr. [name] that he no longer needs to be anti-coagulated. He has had no recurrence of atrial fibrillation. Will not restart Xarelto.  Hopefully d/c home in am.        Complications:  None. The patient tolerated the procedure well.

## 2013-11-29 ENCOUNTER — Other Ambulatory Visit: Payer: Self-pay | Admitting: Physician Assistant

## 2013-11-29 DIAGNOSIS — E782 Mixed hyperlipidemia: Secondary | ICD-10-CM

## 2013-11-29 DIAGNOSIS — I10 Essential (primary) hypertension: Secondary | ICD-10-CM

## 2013-11-29 DIAGNOSIS — I200 Unstable angina: Secondary | ICD-10-CM

## 2013-11-29 DIAGNOSIS — R9439 Abnormal result of other cardiovascular function study: Secondary | ICD-10-CM

## 2013-11-29 DIAGNOSIS — I2510 Atherosclerotic heart disease of native coronary artery without angina pectoris: Secondary | ICD-10-CM

## 2013-11-29 LAB — CBC
HEMATOCRIT: 40.9 % (ref 39.0–52.0)
Hemoglobin: 14 g/dL (ref 13.0–17.0)
MCH: 30.2 pg (ref 26.0–34.0)
MCHC: 34.2 g/dL (ref 30.0–36.0)
MCV: 88.1 fL (ref 78.0–100.0)
Platelets: 176 10*3/uL (ref 150–400)
RBC: 4.64 MIL/uL (ref 4.22–5.81)
RDW: 13.4 % (ref 11.5–15.5)
WBC: 6 10*3/uL (ref 4.0–10.5)

## 2013-11-29 LAB — BASIC METABOLIC PANEL
BUN: 13 mg/dL (ref 6–23)
CHLORIDE: 108 meq/L (ref 96–112)
CO2: 24 mEq/L (ref 19–32)
Calcium: 8.7 mg/dL (ref 8.4–10.5)
Creatinine, Ser: 0.8 mg/dL (ref 0.50–1.35)
GFR, EST NON AFRICAN AMERICAN: 90 mL/min — AB (ref >90)
Glucose, Bld: 102 mg/dL — ABNORMAL HIGH (ref 70–99)
POTASSIUM: 3.7 meq/L (ref 3.7–5.3)
Sodium: 140 mEq/L (ref 137–147)

## 2013-11-29 MED FILL — Sodium Chloride IV Soln 0.9%: INTRAVENOUS | Qty: 50 | Status: AC

## 2013-11-29 NOTE — Discharge Summary (Signed)
Physician Discharge Summary     Patient ID: Damon [name] MRN: [id number] DOB/AGE: 69-Dec-1947 69 y.o.  Admit date: 11/28/2013 Discharge date: 11/29/2013  Admission Diagnoses:  Unstable angina  Discharge Diagnoses:  Active Problems:   Unstable angina   CAD    HLD  Discharged Condition: stable  Hospital Course:  68 yo male with history of HTN, CAD with recent dyspnea and fatigue c/w class III angina. Stress myoview with ischemia, intermediate risk. Cath last week with severe stenosis proximal Circumflex and attempted PCI of Circumflex, unable to deliver stent.   The patient presented for PCI and had successful rotablator atherectomy followed by PTCA/DES proximal Circumflex.  He will be continued on ASA and Plavix.  Xarelto discontinued because he has had no reoccurrence of afib after his ablation.  The patient was seen by Dr. Martinique who felt he was stable for DC home.  He is on a statin and beta blocker.      Consults: None  Significant Diagnostic Studies:   Procedure Details:  The risks, benefits, complications, treatment options, and expected outcomes were discussed with the patient. The patient and/or family concurred with the proposed plan, giving informed consent. The patient was brought to the cath lab after IV hydration was begun and oral premedication was given. The patient was further sedated with Versed and Fentanyl. The right groin was prepped and draped in the usual manner. Using the modified Seldinger access technique, a 7 French sheath was placed in the right femoral artery. A 6 French sheath was placed in the right femoral vein. He had been loaded with Plavix. He was given a weight based bolus of Angiomax and a drip was started. I then engaged the left main with a CLS 3.5 7Frguide. A temporary pacing wire was placed in the RV. When the ACT was over 200, I passed a rotafloppy wire down the Circumflex. I then made 4 runs with a 1.5 mm rotablator burr. A 2.75 x 8 mm [place]  balloon was inflated x 2 in the proximal vessel. I then carefully positioned and deployed a 3.0 x 12 mm Resolute Integrity DES in the proximal Circumflex. The stent was post-dilated with a 3.25 x 8 mm [place] balloon x 1. The stenosis was taken from 99% down to 0%.  There were no immediate complications. The patient was taken to the recovery area in stable condition.  Hemodynamic Findings:  Central aortic pressure: 98/30  Impression:  1. Unstable angina (class III)  2. Severe, calcified stenosis proximal Circumflex  3. Successful rotablator atherectomy followed by PTCA/DES proximal Circumflex  Recommendations: He will need to be continued on ASA and Plavix. He had been on Xarelto following atrial fibrillation ablation but has been told by Dr. [name] that he no longer needs to be anti-coagulated. He has had no recurrence of atrial fibrillation. Will not restart Xarelto. Hopefully d/c home in am.  Complications: None. The patient tolerated the procedure well.    Treatments: See above  Discharge Exam: Blood pressure 112/63, pulse 54, temperature 97.9 F (36.6 C), temperature source Oral, resp. rate 17, height 5' 9.5" (1.765 m), weight 205 lb 11 oz (93.3 kg), SpO2 94.00%.   Disposition: 01-Home or Self Care      Discharge Orders   Future Appointments Provider Department Dept Phone   12/28/2013 3:00 PM [name], MD [facility] [phone]   01/10/2014 9:30 AM Burtis Junes, NP [facility] [phone]   Future Orders Complete By Expires  Diet - low sodium heart healthy  As directed    Discharge instructions  As directed    Increase activity slowly  As directed        Medication List         aspirin 81 MG chewable tablet  Chew 1 tablet (81 mg total) by mouth daily.     bisoprolol-hydrochlorothiazide 10-6.25 MG per tablet  Commonly known as:  ZIAC  Take 1 tablet by mouth daily.     cholecalciferol 1000 UNITS tablet    Commonly known as:  VITAMIN D  Take 5,000 Units by mouth daily.     clopidogrel 75 MG tablet  Commonly known as:  PLAVIX  Take 75 mg by mouth daily with breakfast.     multivitamin with minerals Tabs tablet  Take 1 tablet by mouth daily. FOR MEN     nitroGLYCERIN 0.4 MG SL tablet  Commonly known as:  NITROSTAT  Place 1 tablet (0.4 mg total) under the tongue every 5 (five) minutes as needed for chest pain.     rosuvastatin 20 MG tablet  Commonly known as:  CRESTOR  Take 20 mg by mouth every evening.     sodium chloride 5 % ophthalmic solution  Commonly known as:  MURO 128  Place 1 drop into both eyes every 4 (four) hours as needed for irritation.     vitamin C 1000 MG tablet  Take 1,000 mg by mouth daily.       Follow-up Information   Follow up with Truitt Merle, NP On 01/10/2014. (9:30 AM)    Specialty:  Nurse Practitioner   Contact information:   [address] [place] [address] [phone]       Signed: Tarri Fuller, [facility] 11/29/2013, 8:29 AM  Patient seen and examined and history reviewed. Agree with above findings and plan. See earlier rounding note.  Ander Slade [facility] 11/29/2013 9:28 AM

## 2013-11-29 NOTE — Progress Notes (Signed)
    Subjective: No complaints  Objective: Vital signs in last 24 hours: Temp:  [97.6 F (36.4 C)-98.4 F (36.9 C)] 97.9 F (36.6 C) (05/12 0528) Pulse Rate:  [25-67] 54 (05/12 0528) Resp:  [15-20] 17 (05/12 0528) BP: (103-133)/(58-76) 112/63 mmHg (05/12 0528) SpO2:  [94 %-99 %] 94 % (05/12 0528) Weight:  [205 lb (92.987 kg)-205 lb 11 oz (93.3 kg)] 205 lb 11 oz (93.3 kg) (05/12 0026) Last BM Date: 11/24/13  Intake/Output from previous day: 05/11 0701 - 05/12 0700 In: 751.3 [P.O.:360; I.V.:391.3] Out: 1250 [Urine:1250] Intake/Output this shift: Total I/O In: 131.3 [I.V.:131.3] Out: 1250 [Urine:1250]  Medications Current Facility-Administered Medications  Medication Dose Route Frequency Provider Last Rate Last Dose  . aspirin chewable tablet 81 mg  81 mg Oral Daily [name], MD      . atorvastatin (LIPITOR) tablet 40 mg  40 mg Oral q1800 [name], MD      . bisoprolol-hydrochlorothiazide [facility]) 10-6.25 MG per tablet 1 tablet  1 tablet Oral Daily [name], MD      . clopidogrel (PLAVIX) tablet 75 mg  75 mg Oral Q breakfast [name], MD      . LORazepam (ATIVAN) tablet 0.5 mg  0.5 mg Oral Q4H PRN [name], MD   0.5 mg at 11/28/13 1649  . nitroGLYCERIN (NITROSTAT) SL tablet 0.4 mg  0.4 mg Sublingual Q5 min PRN [name], MD        PE: General appearance: alert, cooperative and no distress Lungs: clear to auscultation bilaterally Heart: regular rate and rhythm, S1, S2 normal, no murmur, click, rub or gallop Extremities: No LEE Pulses: 2+ and symmetric Skin:  Right groin mildly tender. Neurologic: Grossly normal  Lab Results:   Recent Labs  11/29/13 0357  WBC 6.0  HGB 14.0  HCT 40.9  PLT 176   BMET  Recent Labs  11/29/13 0357  NA 140  K 3.7  CL 108  CO2 24  GLUCOSE 102*  BUN 13  CREATININE 0.80  CALCIUM 8.7     Assessment/Plan 68 yo male with history of HTN, CAD with  recent dyspnea and fatigue c/w class III angina. Stress myoview with ischemia, intermediate risk. Cath last week with severe stenosis proximal Circumflex and attempted PCI of Circumflex, unable to deliver stent.   Active Problems:   Unstable angina   CAD   HLD    Plan:  SP successful rotablator atherectomy followed by PTCA/DES proximal Circumflex.   BP and HR stable.  ASA, plavix.  Statin.  BB.  SCr stable.  Sinus brady on tele.  DC home today.    LOS: 1 day    Tarri Fuller PA-C 11/29/2013 6:15 AM  Patient seen and examined and history reviewed. Agree with above findings and plan. Patient without complaints today. Exam is normal. Right groin without hematoma. Ecg is normal. Labs are stable. OK for DC today. Continue ASA and Plavix for at least one year. Can DC Xarelto. Follow up in office about 2 weeks.  Ander Slade [facility] 11/29/2013 6:32 AM

## 2013-11-29 NOTE — Progress Notes (Signed)
CARDIAC REHAB PHASE I   PRE:  Rate/Rhythm: 51 SB  BP:  Supine: 131/66  Sitting:   Standing:    SaO2:   MODE:  Ambulation: 800 ft   POST:  Rate/Rhythm: 65 SR  BP:  Supine: 128/63  Sitting:   Standing:    SaO2:  0569-7948 Pt walked 800 ft with steady gait independently. No CP. Brief review of ed as pt just here last week and seen. Gave ex ed and discussed importance of plavix, reviewed NTG use. Pt voiced understanding. Not interested in Phase 2 Cardiac Rehab. Wants to ex on his own.   Graylon Good, RN BSN  11/29/2013 8:23 AM

## 2013-12-26 ENCOUNTER — Other Ambulatory Visit: Payer: Self-pay | Admitting: *Deleted

## 2013-12-26 MED ORDER — BISOPROLOL-HYDROCHLOROTHIAZIDE 10-6.25 MG PO TABS: 1.0000 | ORAL_TABLET | Freq: Every day | ORAL | Status: DC

## 2013-12-28 ENCOUNTER — Encounter: Payer: Self-pay | Admitting: Internal Medicine

## 2013-12-28 ENCOUNTER — Ambulatory Visit (INDEPENDENT_AMBULATORY_CARE_PROVIDER_SITE_OTHER): Payer: Medicare Other | Admitting: Internal Medicine

## 2013-12-28 VITALS — BP 116/68 | HR 60 | Temp 97.9°F | Resp 18 | Ht 69.0 in | Wt 208.4 lb

## 2013-12-28 DIAGNOSIS — Z0000 Encounter for general adult medical examination without abnormal findings: Secondary | ICD-10-CM

## 2013-12-28 DIAGNOSIS — E559 Vitamin D deficiency, unspecified: Secondary | ICD-10-CM

## 2013-12-28 DIAGNOSIS — Z125 Encounter for screening for malignant neoplasm of prostate: Secondary | ICD-10-CM

## 2013-12-28 DIAGNOSIS — Z79899 Other long term (current) drug therapy: Secondary | ICD-10-CM

## 2013-12-28 DIAGNOSIS — E782 Mixed hyperlipidemia: Secondary | ICD-10-CM

## 2013-12-28 DIAGNOSIS — I10 Essential (primary) hypertension: Secondary | ICD-10-CM

## 2013-12-28 DIAGNOSIS — Z789 Other specified health status: Secondary | ICD-10-CM

## 2013-12-28 DIAGNOSIS — R7309 Other abnormal glucose: Secondary | ICD-10-CM

## 2013-12-28 DIAGNOSIS — Z23 Encounter for immunization: Secondary | ICD-10-CM

## 2013-12-28 DIAGNOSIS — Z1331 Encounter for screening for depression: Secondary | ICD-10-CM

## 2013-12-28 DIAGNOSIS — Z1212 Encounter for screening for malignant neoplasm of rectum: Secondary | ICD-10-CM

## 2013-12-28 LAB — CBC WITH DIFFERENTIAL/PLATELET
BASOS ABS: 0 10*3/uL (ref 0.0–0.1)
BASOS PCT: 0 % (ref 0–1)
Eosinophils Absolute: 0.1 10*3/uL (ref 0.0–0.7)
Eosinophils Relative: 2 % (ref 0–5)
HEMATOCRIT: 41.8 % (ref 39.0–52.0)
Hemoglobin: 14.5 g/dL (ref 13.0–17.0)
LYMPHS PCT: 25 % (ref 12–46)
Lymphs Abs: 1.5 10*3/uL (ref 0.7–4.0)
MCH: 30.2 pg (ref 26.0–34.0)
MCHC: 34.7 g/dL (ref 30.0–36.0)
MCV: 87.1 fL (ref 78.0–100.0)
MONO ABS: 0.4 10*3/uL (ref 0.1–1.0)
Monocytes Relative: 7 % (ref 3–12)
NEUTROS ABS: 4 10*3/uL (ref 1.7–7.7)
NEUTROS PCT: 66 % (ref 43–77)
Platelets: 191 10*3/uL (ref 150–400)
RBC: 4.8 MIL/uL (ref 4.22–5.81)
RDW: 13.4 % (ref 11.5–15.5)
WBC: 6.1 10*3/uL (ref 4.0–10.5)

## 2013-12-28 NOTE — Progress Notes (Signed)
Patient ID: Damon [name], male   DOB: 1946/05/28, 68 y.o.   MRN: [id number]   Annual Screening Comprehensive Examination  This very nice 68 y.o.MWM presents for complete physical.  Patient has been followed for HTN, Diabetes  Prediabetes, Hyperlipidemia, and Vitamin D Deficiency. Patient had recent CV and ablation for pAfib in Jan 2014 and more recently on Nov 28, 2013 has Rotablastor Atherectomy and PTCA/DES placement in the prox. Circ.   HTN predates since 1996. Patient's BP has been controlled at home.Today's BP: 116/68 mmHg. Patient denies any cardiac symptoms as chest pain, palpitations, shortness of breath, dizziness or ankle swelling.   Patient's hyperlipidemia is controlled with diet and medications. Patient denies myalgias or other medication SE's. Last Lipids in Mar 2015 a below were at goal.  Lab Results  Component Value Date   CHOL 153 09/22/2013   HDL 58 09/22/2013   LDLCALC 83 09/22/2013   TRIG 61 09/22/2013   CHOLHDL 2.6 09/22/2013    Patient has prediabetes with A1c 5.7% since May 2011 and last A1c was 5.7% in Mar 2015.Marland Kitchen Patient denies reactive hypoglycemic symptoms, visual blurring, diabetic polys or paresthesias.    Finally, patient has history of Vitamin D Deficiency of 27 in 2009  and last vitamin D   Medication Sig  . VITAMIN C 1000 MG tablet Take 1,000 mg by mouth daily.   Marland Kitchen aspirin 81 MG Chew 1 tablet (81 mg total) by mouth daily.  . bisoprolol-hctz (ZIAC) 10-6.25 MG per tablet Take 1 tablet by mouth daily.  Marland Kitchen VITAMIN D 1000 UNITS tablet Take 5,000 Units by mouth daily.   . clopidogrel (PLAVIX) 75 MG tablet Take 75 mg by mouth daily with breakfast.  . MULITIVITAMIN WITH MINERALS Take 1 tablet by mouth daily. FOR MEN  . NITROSTAT 0.4 MG SL tablet  as needed for chest pain.  . rosuvastatin  20 MG tablet Take 20 mg by mouth every evening.   . sodium chloride (MURO 128) 5 % ophthalmic solution Place 1 drop into both eyes every 4 (four) hours as needed    Allergies   Allergen Reactions  . Zetia [Ezetimibe] Other (See Comments)    myalgias    Past Medical History  Diagnosis Date  . Hyperlipidemia   . Mitral regurgitation   . Bladder cancer   . OSA on CPAP   . Hearing loss of both ears   . History of colon polyps     BENIGN  . PAD (peripheral artery disease)     ABI--  RIGHT SIDE NORMAL LEFT SIDE MODERATELY REDUCED W/ DISTAL LEFT SFA STENOSIS//  MILD CALDICATION  . S/P ablation of atrial fibrillation     07-27-2012  . Anticoagulant long-term use     XARELTO  . Hypertension   . PAF (paroxysmal atrial fibrillation) dx  2007 /   PREVIOUS CARDIOVERTED X3  LAST ONE 07-27-2012  POST  ABLATION  07-27-2012    PRIMARY CARDIOLOGIST-  DR [name] CARDIO--  DR [name]  . Pre-diabetes   . Vitamin D deficiency     Past Surgical History  Procedure Laterality Date  . Total hip arthroplasty  07/25/2011    Procedure: TOTAL HIP ARTHROPLASTY ANTERIOR APPROACH;  Surgeon: Mcarthur Rossetti;  Location: [facility];  Service: Orthopedics;  Laterality: Right;  . Tee without cardioversion  07/26/2012    Procedure: TRANSESOPHAGEAL ECHOCARDIOGRAM (TEE);  Surgeon: [name], MD;  Location: [place];  Service: Cardiovascular;  Laterality: N/A;  . Cardioversion  08/06/2012    Procedure: CARDIOVERSION;  Surgeon: [name], MD;  Location: [place];  Service: Cardiovascular;  Laterality: N/A;  . Total hip arthroplasty Left 02-08-2010  . Cataract extraction w/ intraocular lens  implant, bilateral    . Cardiac electrophysiology study and ablation  07-27-2012  DR ALLRED    SUCCESSFUL ABLATION OF A-FIB  . Tonsillectomy  age 12  . Cystoscopy w/ retrogrades Bilateral 04/12/2013    Procedure: CYSTOSCOPY WITH RETROGRADE PYELOGRAM;  Surgeon: [name], MD;  Location: [facility];  Service: Urology;  Laterality: Bilateral;  . Transurethral resection of bladder tumor with gyrus (turbt-gyrus) N/A 04/12/2013    Procedure: TRANSURETHRAL RESECTION  OF BLADDER TUMOR WITH GYRUS (TURBT-GYRUS);  Surgeon: [name], MD;  Location: [facility];  Service: Urology;  Laterality: N/A;   Family History  Problem Relation Age of Onset  . Heart attack Mother   . Heart disease Mother   . Heart failure Father   . Heart disease Father    History   Social History  . Marital Status: Married    Spouse Name: N/A    Number of Children: N/A  . Years of Education: N/A   Occupational History  . 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   Social History Main Topics  . Smoking status: Former Smoker -- 2.00 packs/day for 10 years    Types: Cigarettes    Quit date: 09/20/1981  . Smokeless tobacco: Never Used  . Alcohol Use: No     Comment: pt states he has stopped drinking alcohol  . Drug Use: No  . Sexual Activity: Not on file    ROS Constitutional: Denies fever, chills, weight loss/gain, headaches, insomnia, fatigue, night sweats or change in appetite. Eyes: Denies redness, blurred vision, diplopia, discharge, itchy or watery eyes.  ENT: Denies discharge, congestion, post nasal drip, epistaxis, sore throat, earache, hearing loss, dental pain, Tinnitus, Vertigo, Sinus pain or snoring.  Cardio: Denies chest pain, palpitations, irregular heartbeat, syncope, dyspnea, diaphoresis, orthopnea, PND, claudication or edema Respiratory: denies cough, dyspnea, DOE, pleurisy, hoarseness, laryngitis or wheezing.  Gastrointestinal: Denies dysphagia, heartburn, reflux, water brash, pain, cramps, nausea, vomiting, bloating, diarrhea, constipation, hematemesis, melena, hematochezia, jaundice or hemorrhoids Genitourinary: Denies dysuria, frequency, urgency, nocturia, hesitancy, discharge, hematuria or flank pain Musculoskeletal: Denies arthralgia, myalgia, stiffness, Jt. Swelling, pain, limp or strain/sprain. Skin: Denies puritis, rash, hives, warts, acne, eczema or change in skin lesion Neuro: No weakness, tremor, incoordination, spasms,  paresthesia or pain Psychiatric: Denies confusion, memory loss or sensory loss Endocrine: Denies change in weight, skin, hair change, nocturia, and paresthesia, diabetic polys, visual blurring or hyper / hypo glycemic episodes.  Heme/Lymph: No excessive bleeding, bruising or enlarged lymph nodes.  Physical Exam  BP 116/68  Pulse 60  Temp 97.9 F   Resp 18  Ht '5\' 9"'    Wt 208 lb 6.4 oz   BMI 30.76 kg/m2  General Appearance: Well nourished, in no apparent distress. Eyes: PERRLA, EOMs, conjunctiva no swelling or erythema, normal fundi and vessels. Sinuses: No frontal/maxillary tenderness ENT/Mouth: EACs patent / TMs  nl. Nares clear without erythema, swelling, mucoid exudates. Oral hygiene is good. No erythema, swelling, or exudate. Tongue normal, non-obstructing. Tonsils not swollen or erythematous. Hearing normal.  Neck: Supple, thyroid normal. No bruits, nodes or JVD. Respiratory: Respiratory effort normal.  BS equal and clear bilateral without rales, rhonci, wheezing or stridor. Cardio: Heart sounds are normal with regular rate and rhythm and no murmurs, rubs or gallops.  Peripheral pulses are normal and equal bilaterally without edema. No aortic or femoral bruits. Chest: symmetric with normal excursions and percussion.  Abdomen: Flat, soft, with bowl sounds. Nontender, no guarding, rebound, hernias, masses, or organomegaly.  Lymphatics: Non tender without lymphadenopathy.  Genitourinary: No hernias.Testes nl. DRE - prostate nl for age - smooth & firm w/o nodules. Musculoskeletal: Full ROM all peripheral extremities, joint stability, 5/5 strength, and normal gait. Skin: Warm and dry without rashes, lesions, cyanosis, clubbing or  ecchymosis.  Neuro: Cranial nerves intact, reflexes equal bilaterally. Normal muscle tone, no cerebellar symptoms. Sensation intact.  Pysch: Awake and oriented X 3, normal affect, insight and judgment appropriate.   Assessment and Plan  1. Annual Screening  Examination 2. Hypertension  3. Hyperlipidemia 4. Pre Diabetes 5. Vitamin D Deficiency 6. ASCAD, s/p PTCA & DES (11/2013) 7. pAfib, s/p CV & ablation  Continue prudent diet as discussed, weight control, BP monitoring, regular exercise, and medications as discussed.  Discussed med effects and SE's. Routine screening labs and tests as requested with regular follow-up as recommended.

## 2013-12-28 NOTE — Patient Instructions (Signed)

## 2013-12-29 LAB — BASIC METABOLIC PANEL WITH GFR
BUN: 9 mg/dL (ref 6–23)
CHLORIDE: 107 meq/L (ref 96–112)
CO2: 28 mEq/L (ref 19–32)
Calcium: 9.1 mg/dL (ref 8.4–10.5)
Creat: 0.78 mg/dL (ref 0.50–1.35)
GFR, Est Non African American: >89 mL/min
Glucose, Bld: 85 mg/dL (ref 70–99)
POTASSIUM: 3.9 meq/L (ref 3.5–5.3)
SODIUM: 142 meq/L (ref 135–145)

## 2013-12-29 LAB — URINALYSIS, MICROSCOPIC ONLY
Bacteria, UA: NONE SEEN
CASTS: NONE SEEN
CRYSTALS: NONE SEEN
Squamous Epithelial / LPF: NONE SEEN

## 2013-12-29 LAB — PSA: PSA: 0.89 ng/mL (ref <=4.00)

## 2013-12-29 LAB — HEPATIC FUNCTION PANEL
ALK PHOS: 50 U/L (ref 39–117)
ALT: 19 U/L (ref 0–53)
AST: 18 U/L (ref 0–37)
Albumin: 4.3 g/dL (ref 3.5–5.2)
BILIRUBIN DIRECT: 0.3 mg/dL (ref 0.0–0.3)
BILIRUBIN TOTAL: 1.7 mg/dL — AB (ref 0.2–1.2)
Indirect Bilirubin: 1.4 mg/dL — ABNORMAL HIGH (ref 0.2–1.2)
Total Protein: 6.4 g/dL (ref 6.0–8.3)

## 2013-12-29 LAB — MICROALBUMIN / CREATININE URINE RATIO
Creatinine, Urine: 43.4 mg/dL
MICROALB/CREAT RATIO: 11.5 mg/g (ref 0.0–30.0)
Microalb, Ur: 0.5 mg/dL (ref 0.00–1.89)

## 2013-12-29 LAB — LIPID PANEL
Cholesterol: 141 mg/dL (ref 0–200)
HDL: 59 mg/dL (ref >39)
LDL Cholesterol: 67 mg/dL (ref 0–99)
Total CHOL/HDL Ratio: 2.4 Ratio
Triglycerides: 76 mg/dL (ref <150)
VLDL: 15 mg/dL (ref 0–40)

## 2013-12-29 LAB — VITAMIN D 25 HYDROXY (VIT D DEFICIENCY, FRACTURES): Vit D, 25-Hydroxy: 91 ng/mL — ABNORMAL HIGH (ref 30–89)

## 2013-12-29 LAB — TSH: TSH: 0.424 u[IU]/mL (ref 0.350–4.500)

## 2013-12-29 LAB — MAGNESIUM: Magnesium: 1.9 mg/dL (ref 1.5–2.5)

## 2013-12-29 LAB — HEMOGLOBIN A1C
HEMOGLOBIN A1C: 5.3 % (ref <5.7)
Mean Plasma Glucose: 105 mg/dL (ref <117)

## 2013-12-29 LAB — INSULIN, FASTING: Insulin fasting, serum: 8 u[IU]/mL (ref 3–28)

## 2014-01-10 ENCOUNTER — Encounter: Payer: Self-pay | Admitting: Nurse Practitioner

## 2014-01-10 ENCOUNTER — Ambulatory Visit (INDEPENDENT_AMBULATORY_CARE_PROVIDER_SITE_OTHER): Payer: Medicare Other | Admitting: Nurse Practitioner

## 2014-01-10 VITALS — BP 100/70 | HR 68 | Ht 69.0 in | Wt 207.8 lb

## 2014-01-10 DIAGNOSIS — I739 Peripheral vascular disease, unspecified: Secondary | ICD-10-CM

## 2014-01-10 DIAGNOSIS — I480 Paroxysmal atrial fibrillation: Secondary | ICD-10-CM

## 2014-01-10 DIAGNOSIS — I259 Chronic ischemic heart disease, unspecified: Secondary | ICD-10-CM

## 2014-01-10 DIAGNOSIS — Z9861 Coronary angioplasty status: Secondary | ICD-10-CM

## 2014-01-10 DIAGNOSIS — I4891 Unspecified atrial fibrillation: Secondary | ICD-10-CM

## 2014-01-10 DIAGNOSIS — Z955 Presence of coronary angioplasty implant and graft: Secondary | ICD-10-CM

## 2014-01-10 NOTE — Progress Notes (Signed)
Damon [name] Date of Birth: 1946/04/19 Medical Record #[id number]  History of Present Illness: Damon [name] is seen back today for a follow up visit. Seen for Dr. [name]. He is a 68 year old male with prior AF ablation, HLD, MR, OSA on CPAP, PAD and HTN.   Last seen here in February of 2015. Felt to be doing ok but noted to have claudication in the past. Referred to Dr. Fletcher Anon. His recommendations were as follows:  "The patient has evidence of underlying peripheral arterial disease with mild non-lifestyle limiting claudication. His ABI was normal on the right side moderately reduced on the left side with evidence of significant left SFA stenosis. Given that he is a nonsmoker and is not diabetic, I think the chance of progression is overall low. I suspect that his claudication will remain mild.  I discussed management options including medical therapy of risk factors versus proceeding with angiography and possible endovascular intervention. I recommend keeping the second option only for refractory symptoms. I advised him to start a walking program and regular basis. I will reevaluate his symptoms in 6 months.  He is already on good medical therapy for hypertension and hyperlipidemia."   I saw him in March of 2015 - He was here because of a chest CT that he had for a lung screening. Saw a message on MyChart. No active symptoms. No chest pain. No chest tightness. Wanted to know what he should do next. Has multiple CV risk factors for CAD. He was referred for Myoview after discussion with both Dr. Caryl Comes and Dr. Burt Knack. This turned out to be abnormal - he was then referred for cardiac cath with subsequent PCI per Dr. Angelena Form.   Comes back today. Here alone. [name]. No chest pain. Feels like he has some "spasms" in the back of his neck. It moves around and goes into his chest - worse with twisting and turning. No exertional symptoms. Thinks his fatigue/dyspnea has improved some. Tolerating his  medicines. Now on aspirin/Plavix. Rhythm ok. Notes that his claudication symptoms have basically resolved.   Current Outpatient Prescriptions  Medication Sig Dispense Refill  . Ascorbic Acid (VITAMIN C) 1000 MG tablet Take 1,000 mg by mouth daily.       Marland Kitchen aspirin 81 MG chewable tablet Chew 1 tablet (81 mg total) by mouth daily.      . bisoprolol-hydrochlorothiazide (ZIAC) 10-6.25 MG per tablet Take 1 tablet by mouth daily.  90 tablet  3  . cholecalciferol (VITAMIN D) 1000 UNITS tablet Take 5,000 Units by mouth daily.       . clopidogrel (PLAVIX) 75 MG tablet Take 75 mg by mouth daily with breakfast.      . Multiple Vitamin (MULITIVITAMIN WITH MINERALS) TABS Take 1 tablet by mouth daily. FOR MEN      . nitroGLYCERIN (NITROSTAT) 0.4 MG SL tablet Place 1 tablet (0.4 mg total) under the tongue every 5 (five) minutes as needed for chest pain.  25 tablet  2  . rosuvastatin (CRESTOR) 20 MG tablet Take 20 mg by mouth every evening.       . sodium chloride (MURO 128) 5 % ophthalmic solution Place 1 drop into both eyes every 4 (four) hours as needed for irritation.       No current facility-administered medications for this visit.    Allergies  Allergen Reactions  . Zetia [Ezetimibe] Other (See Comments)    myalgias    Past Medical History  Diagnosis Date  . Hyperlipidemia   .  Mitral regurgitation   . Bladder cancer   . OSA on CPAP   . Hearing loss of both ears   . History of colon polyps     BENIGN  . PAD (peripheral artery disease)     ABI--  RIGHT SIDE NORMAL LEFT SIDE MODERATELY REDUCED W/ DISTAL LEFT SFA STENOSIS//  MILD CALDICATION  . S/P ablation of atrial fibrillation     07-27-2012  . Anticoagulant long-term use     XARELTO  . Hypertension   . PAF (paroxysmal atrial fibrillation) dx  2007 /   PREVIOUS CARDIOVERTED X3  LAST ONE 07-27-2012  POST  ABLATION  07-27-2012    PRIMARY CARDIOLOGIST-  DR [name] CARDIO--  DR [name]  . Pre-diabetes   . Vitamin D deficiency       Past Surgical History  Procedure Laterality Date  . Total hip arthroplasty  07/25/2011    Procedure: TOTAL HIP ARTHROPLASTY ANTERIOR APPROACH;  Surgeon: Mcarthur Rossetti;  Location: [facility];  Service: Orthopedics;  Laterality: Right;  . Tee without cardioversion  07/26/2012    Procedure: TRANSESOPHAGEAL ECHOCARDIOGRAM (TEE);  Surgeon: [name], MD;  Location: [facility];  Service: Cardiovascular;  Laterality: N/A;  . Cardioversion  08/06/2012    Procedure: CARDIOVERSION;  Surgeon: [name], MD;  Location: [place];  Service: Cardiovascular;  Laterality: N/A;  . Total hip arthroplasty Left 02-08-2010  . Cataract extraction w/ intraocular lens  implant, bilateral    . Cardiac electrophysiology study and ablation  07-27-2012  DR ALLRED    SUCCESSFUL ABLATION OF A-FIB  . Tonsillectomy  age 66  . Cystoscopy w/ retrogrades Bilateral 04/12/2013    Procedure: CYSTOSCOPY WITH RETROGRADE PYELOGRAM;  Surgeon: [name], MD;  Location: [facility];  Service: Urology;  Laterality: Bilateral;  . Transurethral resection of bladder tumor with gyrus (turbt-gyrus) N/A 04/12/2013    Procedure: TRANSURETHRAL RESECTION OF BLADDER TUMOR WITH GYRUS (TURBT-GYRUS);  Surgeon: [name], MD;  Location: [facility];  Service: Urology;  Laterality: N/A;    History  Smoking status  . Former Smoker -- 2.00 packs/day for 10 years  . Types: Cigarettes  . Quit date: 09/20/1981  Smokeless tobacco  . Never Used    History  Alcohol Use No    Comment: pt states he has stopped drinking alcohol    Family History  Problem Relation Age of Onset  . Heart attack Mother   . Heart disease Mother   . Heart failure Father   . Heart disease Father     Review of Systems: The review of systems is per the HPI.  All other systems were reviewed and are negative.  Physical Exam: There were no vitals taken for this visit. Patient is very pleasant and in no acute  distress. Skin is warm and dry. Color is normal.  HEENT is unremarkable. Normocephalic/atraumatic. PERRL. Sclera are nonicteric. Neck is supple. No masses. No JVD. Lungs are clear. Cardiac exam shows a regular rate and rhythm. Abdomen is soft. Extremities are without edema. Gait and ROM are intact. No gross neurologic deficits noted.  LABORATORY DATA: Lab Results  Component Value Date   WBC 6.1 12/28/2013   HGB 14.5 12/28/2013   HCT 41.8 12/28/2013   PLT 191 12/28/2013   GLUCOSE 85 12/28/2013   CHOL 141 12/28/2013   TRIG 76 12/28/2013   HDL 59 12/28/2013   LDLCALC 67 12/28/2013   ALT 19 12/28/2013   AST 18 12/28/2013  NA 142 12/28/2013   K 3.9 12/28/2013   CL 107 12/28/2013   CREATININE 0.78 12/28/2013   BUN 9 12/28/2013   CO2 28 12/28/2013   TSH 0.424 12/28/2013   PSA 0.89 12/28/2013   INR 1.1* 11/16/2013   HGBA1C 5.3 12/28/2013   MICROALBUR 0.50 12/28/2013      BNP (last 3 results) No results found for this basename: PROBNP,  in the last 8760 hours  Procedure Performed:  1. Left Heart Catheterization 2. Selective Coronary Angiography 3. Left ventricular angiogram 4. PTCA proximal Circumflex with balloon angioplasty only, failed attempt to place stent due to calcified vessel. 5. Fractional flow reserve LAD Operator: Lauree Chandler, MD  Arterial access site: Right radial artery.  Indication: 68 yo male with HTN, HLD, former tobacco abuse, atrial fib with recent stress myoview with inferior ischemia, chest pain and dypsnea with exercise, ST changes c/w ischemia. He reports class II exertional angina/dyspnea.  Procedure Details:  The risks, benefits, complications, treatment options, and expected outcomes were discussed with the patient. The patient and/or family concurred with the proposed plan, giving informed consent. The patient was brought to the cath lab after IV hydration was begun and oral premedication was given. The patient was further sedated with Versed and Fentanyl. The right  wrist was assessed with an Allens test which was positive. The right wrist was prepped and draped in a sterile fashion. 1% lidocaine was used for local anesthesia. Using the modified Seldinger access technique, a 5 French sheath was placed in the right radial artery. 3 mg Verapamil was given through the sheath. 5000 units IV heparin was given. Standard diagnostic catheters were used to perform selective coronary angiography. A pigtail catheter was used to perform a left ventricular angiogram. He was found to have moderate LAD stenosis and severe proximal Circumflex stenosis. I elected to proceed to FFR of the LAD and PCI of the Circumflex.  PCI Note:  FFR of LAD: The patient was given an additional 4000 Units IV heparin. ACT was over 200. The left main was engaged with a XB LAD 3.5 guiding catheter. I then passed a flow wire down the LAD. Baseline FFR 0.93. With infusion of IV adenosine the FFR was 0.87 suggesting the moderate calcified mid LAD stenosis was not flow limiting. I then turned my attention to the Circumflex.  Lesion #1 proximal Circumflex: He was given 600 mg Plavix po x 1. I then advanced a Cougar IC wire down the Circumflex. I could not pass a balloon. I then used a Whisper wire as a buddy wire for support. I was then able to pass a 1.5 mm balloon into the proximal Circumflex and inflated to 12 atm x 2. I then passed a 2.0 x 12 mm balloon over the wire and inflated x 2 at 12 atm. I was unable to pass a stent over the wire. I tried to pass an Angiosculpt balloon into the vessel could not pass this device to calcification. I then passed a Mailman wire into the Circumflex for better support but could not pass the stent over this heavier wire. I had good flow down the vessel so I stopped the case with plans to stage rotablator atherectomy next week.  The sheath was removed from the right radial artery and a Terumo hemostasis band was applied at the arteriotomy site on the right wrist. There were no  immediate complications. The patient was taken to the recovery area in stable condition.  Hemodynamic Findings:  Central aortic pressure:  110/55  Left ventricular pressure: 106/7/14  Angiographic Findings:  Left main: Diffuse proximal and mid 20-30% stenosis.  Left Anterior Descending Artery: Large caliber vessel that courses to the apex. The vessel is heavily calcified in the proximal and mid segments. The mid vessel has a 50% heavily calcified stenosis. The distal vessel has diffuse plaque. The first diagonal branch is very small in caliber with ostial 50% stenosis. The second diagonal branch is moderate in caliber with mild plaque.  Circumflex Artery: Large vessel with heavy calcification in the proximal segment. There is a 99% proximal stenosis just beyond the ostium. The first OM branch is patent with mild plaque. The continuation of the AV groove Circumflex has a 40% stenosis.  Right Coronary Artery: Large dominant vessel with diffuse 20% stenosis, moderate proximal and mid calcification.  Left Ventricular Angiogram: LVEF=55-60%.  Impression:  1. Severe single vessel CAD with heavily calcified stenosis in the proximal Circumflex, likely culprit lesion for unstable angina  2. PTCA of the Circumflex with some improvement in luminal narrowing but unable to deliver stent due to calcification  3. Moderate LAD stenosis with FFR demonstrating adequate flow down vessel  4. Normal LV systolic function  Recommendations: Will continue ASA and Plavix. Will plan to bring back in 1-2 weeks for staged PCI with rotablator atherectomy of the proximal Circumflex. Continue statin, beta blocker. Monitor overnight and discharge home in am if stable.  Complications: None. The patient tolerated the procedure well.     Procedure Performed:  1. Rotablator atherectomy circumflex  2. PTCA/DES x 1 proximal Circumflex  3. Angioseal RFA  Operator: Lauree Chandler, MD  Indication: 68 yo male with history of  HTN, CAD with recent dyspnea and fatigue c/w class III angina. Stress myoview with ischemia, intermediate risk. Cath last week with severe stenosis proximal Circumflex and attempted PCI of Circumflex, unable to deliver stent.  Procedure Details:  The risks, benefits, complications, treatment options, and expected outcomes were discussed with the patient. The patient and/or family concurred with the proposed plan, giving informed consent. The patient was brought to the cath lab after IV hydration was begun and oral premedication was given. The patient was further sedated with Versed and Fentanyl. The right groin was prepped and draped in the usual manner. Using the modified Seldinger access technique, a 7 French sheath was placed in the right femoral artery. A 6 French sheath was placed in the right femoral vein. He had been loaded with Plavix. He was given a weight based bolus of Angiomax and a drip was started. I then engaged the left main with a CLS 3.5 7Frguide. A temporary pacing wire was placed in the RV. When the ACT was over 200, I passed a rotafloppy wire down the Circumflex. I then made 4 runs with a 1.5 mm rotablator burr. A 2.75 x 8 mm Slater balloon was inflated x 2 in the proximal vessel. I then carefully positioned and deployed a 3.0 x 12 mm Resolute Integrity DES in the proximal Circumflex. The stent was post-dilated with a 3.25 x 8 mm Mosheim balloon x 1. The stenosis was taken from 99% down to 0%.  There were no immediate complications. The patient was taken to the recovery area in stable condition.  Hemodynamic Findings:  Central aortic pressure: 98/30  Impression:  1. Unstable angina (class III)  2. Severe, calcified stenosis proximal Circumflex  3. Successful rotablator atherectomy followed by PTCA/DES proximal Circumflex  Recommendations: He will need to be continued on ASA and Plavix.  He had been on Xarelto following atrial fibrillation ablation but has been told by Dr. [name] that he no  longer needs to be anti-coagulated. He has had no recurrence of atrial fibrillation. Will not restart Xarelto. Hopefully d/c home in am.  Complications: None. The patient tolerated the procedure well.   Assessment / Plan: 1. Post PCI to the LCX with rotablator atherectomy followed by PTCA/DES - residual disease in the LAD to be treated medically. Doing well clinically. See back in 4 months. Continue with CV risk factor modification.   2. PAF - rhythm ok on exam today.   3. PVD - no real symptoms at this time.   Patient is agreeable to this plan and will call if any problems develop in the interim.   Burtis Junes, RN, [place] [address] [place], [place]  [phone]

## 2014-01-10 NOTE — Patient Instructions (Signed)
I will see you in 4 months  Stay on your current medicines  Stay active  Call the [place] office at [phone] if you have any questions, problems or concerns.

## 2014-03-01 NOTE — Progress Notes (Signed)
MEDICARE ANNUAL WELLNESS VISIT AND FOLLOW UP Assessment:   1. Essential hypertension - CBC with Differential - BASIC METABOLIC PANEL WITH GFR - Hepatic function panel - TSH  2. Peripheral arterial disease Continue exercise and ASA  3. Paroxysmal atrial fibrillation Continue anticoagulant and follow up with cardio, controlled rate  4. Encounter for long-term (current) use of other medications - Magnesium  5. Hyperlipidemia - Lipid panel  6. PreDiabetes Discussed general issues about diabetes pathophysiology and management., Educational material distributed., Suggested low cholesterol diet., Encouraged aerobic exercise., Discussed foot care., Reminded to get yearly retinal exam. - [facility] DIABETES FOOT EXAM  7. Sleep apnea Continue CPAP  8. Vitamin D Deficiency - Vit D  25 hydroxy (rtn osteoporosis monitoring)  9. Atypical chest pain, non exertional, improving-  ? GERD versus musculoskeletal, does not want meds right now since improving, if worse will call office and if exertional/SOB, dizziness will follow up cardio   Plan:   During the course of the visit the patient was educated and counseled about appropriate screening and preventive services including:    Pneumococcal vaccine   Influenza vaccine  Td vaccine  Screening electrocardiogram  Colorectal cancer screening  Diabetes screening  Glaucoma screening  Nutrition counseling   Screening recommendations, referrals: Vaccinations: Tdap vaccine not indicated Influenza vaccine requested Pneumococcal vaccine dicussed prevnar 13 Shingles vaccine declined Hep B vaccine not indicated  Nutrition assessed and recommended  Colonoscopy due 2016 Recommended yearly ophthalmology/optometry visit for glaucoma screening and checkup Recommended yearly dental visit for hygiene and checkup Advanced directives - declined  Conditions/risks identified: BMI: Discussed weight loss, diet, and increase physical activity.   Increase physical activity: AHA recommends 150 minutes of physical activity a week.  Medications reviewed Diabetes is at goal, ACE/ARB therapy: only predm Urinary Incontinence is not an issue: discussed non pharmacology and pharmacology options.  Fall risk: low- discussed PT, home fall assessment, medications.   Subjective:  Damon [name] is a 68 y.o. male who presents for Medicare Annual Wellness Visit and 3 month follow up for HTN, hyperlipidemia, prediabetes, and vitamin D Def.  Date of last medicare wellness visit was is unknown.  His blood pressure has been controlled at home, today their BP is BP: 110/78 mmHg He does workout. He denies chest pain, shortness of breath, dizziness.  He is on cholesterol medication and denies myalgias. His cholesterol is at goal. The cholesterol last visit was:   Lab Results  Component Value Date   CHOL 141 12/28/2013   HDL 59 12/28/2013   LDLCALC 67 12/28/2013   TRIG 76 12/28/2013   CHOLHDL 2.4 12/28/2013   He has been working on diet and exercise for prediabetes, and denies polydipsia and polyuria. Last A1C in the office was:  Lab Results  Component Value Date   HGBA1C 5.3 12/28/2013   Patient is on Vitamin D supplement.   Lab Results  Component Value Date   VD25OH 60* 12/28/2013     He has a complicated cardiovascular history. He follows with Dr. [name] for pAfib s/p ablation 07/2012, PAD, and most recently had a PTCA/DES to proximal circumflex in May 2015.  Has history of multiple pulmonary nodules, more recent CT chest 09/2013 recommends repeat study in 1 year.   Wt Readings from Last 3 Encounters:  03/02/14 202 lb (91.627 kg)  01/10/14 207 lb 12.8 oz (94.257 kg)  12/28/13 208 lb 6.4 oz (94.53 kg)   Since stent placement has had intermittent nonexertional very localized substernal "ache", no radiation, no  SOB, dizziness, sweatiness. He states he can occ move his neck and it can help it. Last for 10 mins- an hour. Possibly after eating, but  nothing really worse or better. Denies NSAID use.  Has been becoming less frequent.   Names of Other Physician/Practitioners you currently use: 1. [facility] here for primary care 2. Dr. Graciella Belton, eye doctor, q 2 years saw 1 year 3. NONE dentist, last visit as needed Patient Care Team: [name], MD as PCP - General (Internal Medicine) Dwan Bolt, MD as Referring Physician (Cardiology) [name], MD as Consulting Physician (Cardiology) [name], MD as Consulting Physician (Urology) Mcarthur Rossetti, MD as Consulting Physician (Orthopedic Surgery) Ponciano Ort, MD as Referring Physician (Urology)  Medication Review: Current Outpatient Prescriptions on File Prior to Visit  Medication Sig Dispense Refill  . Ascorbic Acid (VITAMIN C) 1000 MG tablet Take 1,000 mg by mouth as needed.       Marland Kitchen aspirin 81 MG chewable tablet Chew 1 tablet (81 mg total) by mouth daily.      . bisoprolol-hydrochlorothiazide (ZIAC) 10-6.25 MG per tablet Take 1 tablet by mouth daily.  90 tablet  3  . cholecalciferol (VITAMIN D) 1000 UNITS tablet Take 5,000 Units by mouth daily.       . clopidogrel (PLAVIX) 75 MG tablet Take 75 mg by mouth daily with breakfast.      . Multiple Vitamin (MULITIVITAMIN WITH MINERALS) TABS Take 1 tablet by mouth daily. FOR MEN      . nitroGLYCERIN (NITROSTAT) 0.4 MG SL tablet Place 1 tablet (0.4 mg total) under the tongue every 5 (five) minutes as needed for chest pain.  25 tablet  2  . rosuvastatin (CRESTOR) 20 MG tablet Take 20 mg by mouth every evening.       . sodium chloride (MURO 128) 5 % ophthalmic solution Place 1 drop into both eyes every 4 (four) hours as needed for irritation.       No current facility-administered medications on file prior to visit.    Current Problems (verified) Patient Active Problem List   Diagnosis Date Noted  . Encounter for long-term (current) use of other medications  12/28/2013  . CAD- CFX PTCA 11/24/13 with plans for DES 11/28/13 11/25/2013  . Abnormal cardiovascular stress test 11/25/2013  . Chronic anticoagulation- Xarelto stopped for PCI 11/25/2013  . Unstable angina 11/24/2013  . Hyperlipidemia 06/22/2013  . PreDiabetes 06/22/2013  . Vitamin D Deficiency 06/22/2013  . Peripheral arterial disease 03/22/2013  . Hypertension   . Mitral regurgitation   . Degenerative arthritis of hip 07/25/2011  . Atrial fibrillation 07/11/2011  . Sleep apnea 07/11/2011    Screening Tests Health Maintenance  Topic Date Due  . Zostavax  05/04/2005  . Pneumococcal Polysaccharide Vaccine Age 43 And Over  05/04/2010  . Influenza Vaccine  02/18/2014  . Colonoscopy  12/26/2019  . Tetanus/tdap  09/23/2023    Immunization History  Administered Date(s) Administered  . DT 07/22/2003, 12/28/2013  . Influenza Whole 05/19/2013  . Pneumococcal Polysaccharide-23 12/04/2009  . Tdap 09/22/2013    Preventative care: Last colonoscopy: 2011 due 2016  Prior vaccinations: TD or Tdap: 2015  Influenza: 2014  Pneumococcal: 2011 Prevnar 13: discussed Shingles/Zostavax: declines- has had shingles twice and was minor Cath May 2015 s/p PTCA/DES pCircumflex   History reviewed: allergies, current medications, past family history, past medical history, past social history, past surgical history and problem list   Risk Factors: Tobacco History  Substance Use Topics  . Smoking status: Former Smoker -- 2.00 packs/day for 10 years    Types: Cigarettes    Quit date: 09/20/1981  . Smokeless tobacco: Never Used  . Alcohol Use: No     Comment: pt states he has stopped drinking alcohol   He does not smoke.  Patient is a former smoker. Are there smokers in your home (other than you)?  No  Alcohol Current alcohol use: none  Caffeine Current caffeine use: coffee 2 /day  Exercise Current exercise: golfs and bikes 12-15 miles a day  Nutrition/Diet Current diet: in  general, a "healthy" diet    Cardiac risk factors: advanced age (older than 4 for men, 65 for women), dyslipidemia, hypertension, male gender, obesity (BMI >= 30 kg/m2) and sedentary lifestyle.  Depression Screen (Note: if answer to either of the following is "Yes", a more complete depression screening is indicated)   Q1: Over the past two weeks, have you felt down, depressed or hopeless? No  Q2: Over the past two weeks, have you felt little interest or pleasure in doing things? No  Have you lost interest or pleasure in daily life? No  Do you often feel hopeless? No  Do you cry easily over simple problems? No  Activities of Daily Living In your present state of health, do you have any difficulty performing the following activities?:  Driving? No Managing money?  No Feeding yourself? No Getting from bed to chair? No Climbing a flight of stairs? No Preparing food and eating?: No Bathing or showering? No Getting dressed: No Getting to the toilet? No Using the toilet:No Moving around from place to place: No In the past year have you fallen or had a near fall?:No   Are you sexually active?  Yes  Do you have more than one partner?  No  Vision Difficulties: No  Hearing Difficulties: Yes Do you often ask people to speak up or repeat themselves? Yes Do you experience ringing or noises in your ears? No Do you have difficulty understanding soft or whispered voices? Yes  Cognition  Do you feel that you have a problem with memory?No  Do you often misplace items? No  Do you feel safe at home?  Yes  Advanced directives Does patient have a [place]? No Does patient have a Living Will? No   Objective:   Blood pressure 110/78, pulse 56, temperature 97.7 F (36.5 C), resp. rate 16, height 5\' 8"  (1.727 m), weight 202 lb (91.627 kg). Body mass index is 30.72 kg/(m^2).  General appearance: alert, no distress, WD/WN, male Cognitive Testing  Alert? Yes  Normal  Appearance?Yes  Oriented to person? Yes  Place? Yes   Time? Yes  Recall of three objects?  Yes  Can perform simple calculations? Yes  Displays appropriate judgment?Yes  Can read the correct time from a watch face?Yes  HEENT: normocephalic, sclerae anicteric, TMs pearly, nares patent, no discharge or erythema, pharynx normal Oral cavity: MMM, no lesions Neck: supple, no lymphadenopathy, no thyromegaly, no masses Heart: RRR, normal S1, S2, no murmurs Lungs: CTA bilaterally, no wheezes, rhonchi, or rales Abdomen: +bs, soft, non tender, non distended, no masses, no hepatomegaly, no splenomegaly Musculoskeletal: nontender, no swelling, no obvious deformity Extremities: no edema, no cyanosis, no clubbing Pulses: 2+ symmetric, upper and lower extremities, normal cap refill Neurological: alert, oriented x 3, CN2-12 intact, strength normal upper extremities and lower extremities, sensation normal throughout, DTRs 2+ throughout, no cerebellar signs, gait normal Psychiatric:  normal affect, behavior normal, pleasant   Medicare Attestation I have personally reviewed: The patient's medical and social history Their use of alcohol, tobacco or illicit drugs Their current medications and supplements The patient's functional ability including ADLs,fall risks, home safety risks, cognitive, and hearing and visual impairment Diet and physical activities Evidence for depression or mood disorders  The patient's weight, height, BMI, and visual acuity have been recorded in the chart.  I have made referrals, counseling, and provided education to the patient based on review of the above and I have provided the patient with a written personalized care plan for preventive services.     Vicie Mutters, PA-C   03/02/2014

## 2014-03-02 ENCOUNTER — Encounter: Payer: Self-pay | Admitting: Physician Assistant

## 2014-03-02 ENCOUNTER — Ambulatory Visit (INDEPENDENT_AMBULATORY_CARE_PROVIDER_SITE_OTHER): Payer: Medicare Other | Admitting: Physician Assistant

## 2014-03-02 VITALS — BP 110/78 | HR 56 | Temp 97.7°F | Resp 16 | Ht 68.0 in | Wt 202.0 lb

## 2014-03-02 DIAGNOSIS — I480 Paroxysmal atrial fibrillation: Secondary | ICD-10-CM

## 2014-03-02 DIAGNOSIS — G4730 Sleep apnea, unspecified: Secondary | ICD-10-CM

## 2014-03-02 DIAGNOSIS — E782 Mixed hyperlipidemia: Secondary | ICD-10-CM

## 2014-03-02 DIAGNOSIS — Z0000 Encounter for general adult medical examination without abnormal findings: Secondary | ICD-10-CM

## 2014-03-02 DIAGNOSIS — E559 Vitamin D deficiency, unspecified: Secondary | ICD-10-CM

## 2014-03-02 DIAGNOSIS — I739 Peripheral vascular disease, unspecified: Secondary | ICD-10-CM

## 2014-03-02 DIAGNOSIS — Z789 Other specified health status: Secondary | ICD-10-CM

## 2014-03-02 DIAGNOSIS — I10 Essential (primary) hypertension: Secondary | ICD-10-CM

## 2014-03-02 DIAGNOSIS — R7309 Other abnormal glucose: Secondary | ICD-10-CM

## 2014-03-02 DIAGNOSIS — Z79899 Other long term (current) drug therapy: Secondary | ICD-10-CM

## 2014-03-02 DIAGNOSIS — Z1331 Encounter for screening for depression: Secondary | ICD-10-CM

## 2014-03-02 LAB — BASIC METABOLIC PANEL WITH GFR
BUN: 18 mg/dL (ref 6–23)
CHLORIDE: 108 meq/L (ref 96–112)
CO2: 26 mEq/L (ref 19–32)
Calcium: 9.5 mg/dL (ref 8.4–10.5)
Creat: 0.8 mg/dL (ref 0.50–1.35)
GFR, Est African American: >89 mL/min
Glucose, Bld: 110 mg/dL — ABNORMAL HIGH (ref 70–99)
Potassium: 4.1 mEq/L (ref 3.5–5.3)
Sodium: 143 mEq/L (ref 135–145)

## 2014-03-02 LAB — CBC WITH DIFFERENTIAL/PLATELET
BASOS PCT: 0 % (ref 0–1)
Basophils Absolute: 0 10*3/uL (ref 0.0–0.1)
EOS PCT: 5 % (ref 0–5)
Eosinophils Absolute: 0.3 10*3/uL (ref 0.0–0.7)
HEMATOCRIT: 42.5 % (ref 39.0–52.0)
Hemoglobin: 14.9 g/dL (ref 13.0–17.0)
LYMPHS PCT: 25 % (ref 12–46)
Lymphs Abs: 1.5 10*3/uL (ref 0.7–4.0)
MCH: 30 pg (ref 26.0–34.0)
MCHC: 35.1 g/dL (ref 30.0–36.0)
MCV: 85.7 fL (ref 78.0–100.0)
Monocytes Absolute: 0.5 10*3/uL (ref 0.1–1.0)
Monocytes Relative: 8 % (ref 3–12)
Neutro Abs: 3.6 10*3/uL (ref 1.7–7.7)
Neutrophils Relative %: 62 % (ref 43–77)
Platelets: 192 10*3/uL (ref 150–400)
RBC: 4.96 MIL/uL (ref 4.22–5.81)
RDW: 13.1 % (ref 11.5–15.5)
WBC: 5.8 10*3/uL (ref 4.0–10.5)

## 2014-03-02 LAB — LIPID PANEL
Cholesterol: 164 mg/dL (ref 0–200)
HDL: 66 mg/dL (ref >39)
LDL CALC: 84 mg/dL (ref 0–99)
Total CHOL/HDL Ratio: 2.5 Ratio
Triglycerides: 68 mg/dL (ref <150)
VLDL: 14 mg/dL (ref 0–40)

## 2014-03-02 LAB — TSH: TSH: 0.757 u[IU]/mL (ref 0.350–4.500)

## 2014-03-02 LAB — HEPATIC FUNCTION PANEL
ALBUMIN: 4.3 g/dL (ref 3.5–5.2)
ALK PHOS: 51 U/L (ref 39–117)
ALT: 22 U/L (ref 0–53)
AST: 19 U/L (ref 0–37)
Bilirubin, Direct: 0.2 mg/dL (ref 0.0–0.3)
Indirect Bilirubin: 0.8 mg/dL (ref 0.2–1.2)
Total Bilirubin: 1 mg/dL (ref 0.2–1.2)
Total Protein: 6.7 g/dL (ref 6.0–8.3)

## 2014-03-02 LAB — MAGNESIUM: Magnesium: 1.8 mg/dL (ref 1.5–2.5)

## 2014-03-02 NOTE — Patient Instructions (Signed)
Preventative Care for Adults, Male       REGULAR HEALTH EXAMS:  A routine yearly physical is a good way to check in with your primary care provider about your health and preventive screening. It is also an opportunity to share updates about your health and any concerns you have, and receive a thorough all-over exam.   Most health insurance companies pay for at least some preventative services.  Check with your health plan for specific coverages.  WHAT PREVENTATIVE SERVICES DO MEN NEED?  Adult men should have their weight and blood pressure checked regularly.   Men age 35 and older should have their cholesterol levels checked regularly.  Beginning at age 50 and continuing to age 75, men should be screened for colorectal cancer.  Certain people should may need continued testing until age 85.  Other cancer screening may include exams for testicular and prostate cancer.  Updating vaccinations is part of preventative care.  Vaccinations help protect against diseases such as the flu.  Lab tests are generally done as part of preventative care to screen for anemia and blood disorders, to screen for problems with the kidneys and liver, to screen for bladder problems, to check blood sugar, and to check your cholesterol level.  Preventative services generally include counseling about diet, exercise, avoiding tobacco, drugs, excessive alcohol consumption, and sexually transmitted infections.    GENERAL RECOMMENDATIONS FOR GOOD HEALTH:  Healthy diet:  Eat a variety of foods, including fruit, vegetables, animal or vegetable protein, such as meat, fish, chicken, and eggs, or beans, lentils, tofu, and grains, such as rice.  Drink plenty of water daily.  Decrease saturated fat in the diet, avoid lots of red meat, processed foods, sweets, fast foods, and fried foods.  Exercise:  Aerobic exercise helps maintain good heart health. At least 30-40 minutes of moderate-intensity exercise is recommended.  For example, a brisk walk that increases your heart rate and breathing. This should be done on most days of the week.   Find a type of exercise or a variety of exercises that you enjoy so that it becomes a part of your daily life.  Examples are running, walking, swimming, water aerobics, and biking.  For motivation and support, explore group exercise such as aerobic class, spin class, Zumba, Yoga,or  martial arts, etc.    Set exercise goals for yourself, such as a certain weight goal, walk or run in a race such as a 5k walk/run.  Speak to your primary care provider about exercise goals.  Disease prevention:  If you smoke or chew tobacco, find out from your caregiver how to quit. It can literally save your life, no matter how long you have been a tobacco user. If you do not use tobacco, never begin.   Maintain a healthy diet and normal weight. Increased weight leads to problems with blood pressure and diabetes.   The Body Mass Index or BMI is a way of measuring how much of your body is fat. Having a BMI above 27 increases the risk of heart disease, diabetes, hypertension, stroke and other problems related to obesity. Your caregiver can help determine your BMI and based on it develop an exercise and dietary program to help you achieve or maintain this important measurement at a healthful level.  High blood pressure causes heart and blood vessel problems.  Persistent high blood pressure should be treated with medicine if weight loss and exercise do not work.   Fat and cholesterol leaves deposits in your arteries   that can block them. This causes heart disease and vessel disease elsewhere in your body.  If your cholesterol is found to be high, or if you have heart disease or certain other medical conditions, then you may need to have your cholesterol monitored frequently and be treated with medication.   Ask if you should have a stress test if your history suggests this. A stress test is a test done on  a treadmill that looks for heart disease. This test can find disease prior to there being a problem.  Avoid drinking alcohol in excess (more than two drinks per day).  Avoid use of street drugs. Do not share needles with anyone. Ask for professional help if you need assistance or instructions on stopping the use of alcohol, cigarettes, and/or drugs.  Brush your teeth twice a day with fluoride toothpaste, and floss once a day. Good oral hygiene prevents tooth decay and gum disease. The problems can be painful, unattractive, and can cause other health problems. Visit your dentist for a routine oral and dental check up and preventive care every 6-12 months.   Look at your skin regularly.  Use a mirror to look at your back. Notify your caregivers of changes in moles, especially if there are changes in shapes, colors, a size larger than a pencil eraser, an irregular border, or development of new moles.  Safety:  Use seatbelts 100% of the time, whether driving or as a passenger.  Use safety devices such as hearing protection if you work in environments with loud noise or significant background noise.  Use safety glasses when doing any work that could send debris in to the eyes.  Use a helmet if you ride a bike or motorcycle.  Use appropriate safety gear for contact sports.  Talk to your caregiver about gun safety.  Use sunscreen with a SPF (or skin protection factor) of 15 or greater.  Lighter skinned people are at a greater risk of skin cancer. Don't forget to also wear sunglasses in order to protect your eyes from too much damaging sunlight. Damaging sunlight can accelerate cataract formation.   Practice safe sex. Use condoms. Condoms are used for birth control and to help reduce the spread of sexually transmitted infections (or STIs).  Some of the STIs are gonorrhea (the clap), chlamydia, syphilis, trichomonas, herpes, HPV (human papilloma virus) and HIV (human immunodeficiency virus) which causes AIDS.  The herpes, HIV and HPV are viral illnesses that have no cure. These can result in disability, cancer and death.   Keep carbon monoxide and smoke detectors in your home functioning at all times. Change the batteries every 6 months or use a model that plugs into the wall.   Vaccinations:  Stay up to date with your tetanus shots and other required immunizations. You should have a booster for tetanus every 10 years. Be sure to get your flu shot every year, since 5%-20% of the [place] population comes down with the flu. The flu vaccine changes each year, so being vaccinated once is not enough. Get your shot in the fall, before the flu season peaks.   Other vaccines to consider:  Pneumococcal vaccine to protect against certain types of pneumonia.  This is normally recommended for adults age 65 or older.  However, adults younger than 69 years old with certain underlying conditions such as diabetes, heart or lung disease should also receive the vaccine.  Shingles vaccine to protect against Varicella Zoster if you are older than age 60, or younger   than 69 years old with certain underlying illness.  Hepatitis A vaccine to protect against a form of infection of the liver by a virus acquired from food.  Hepatitis B vaccine to protect against a form of infection of the liver by a virus acquired from blood or body fluids, particularly if you work in health care.  If you plan to travel internationally, check with your local health department for specific vaccination recommendations.  Cancer Screening:  Most routine colon cancer screening begins at the age of 50. On a yearly basis, doctors may provide special easy to use take-home tests to check for hidden blood in the stool. Sigmoidoscopy or colonoscopy can detect the earliest forms of colon cancer and is life saving. These tests use a small camera at the end of a tube to directly examine the colon. Speak to your caregiver about this at age 50, when routine  screening begins (and is repeated every 5 years unless early forms of pre-cancerous polyps or small growths are found).   At the age of 50 men usually start screening for prostate cancer every year. Screening may begin at a younger age for those with higher risk. Those at higher risk include African-Americans or having a family history of prostate cancer. There are two types of tests for prostate cancer:   Prostate-specific antigen (PSA) testing. Recent studies raise questions about prostate cancer using PSA and you should discuss this with your caregiver.   Digital rectal exam (in which your doctor's lubricated and gloved finger feels for enlargement of the prostate through the anus).   Screening for testicular cancer.  Do a monthly exam of your testicles. Gently roll each testicle between your thumb and fingers, feeling for any abnormal lumps. The best time to do this is after a hot shower or bath when the tissues are looser. Notify your caregivers of any lumps, tenderness or changes in size or shape immediately.     

## 2014-03-03 LAB — VITAMIN D 25 HYDROXY (VIT D DEFICIENCY, FRACTURES): Vit D, 25-Hydroxy: 84 ng/mL (ref 30–89)

## 2014-03-16 ENCOUNTER — Other Ambulatory Visit: Payer: Medicare Other

## 2014-03-16 DIAGNOSIS — Z1212 Encounter for screening for malignant neoplasm of rectum: Secondary | ICD-10-CM

## 2014-03-16 LAB — POC HEMOCCULT BLD/STL (HOME/3-CARD/SCREEN)
Card #3 Fecal Occult Blood, POC: NEGATIVE
FECAL OCCULT BLD: NEGATIVE
FECAL OCCULT BLD: NEGATIVE

## 2014-04-06 ENCOUNTER — Ambulatory Visit: Payer: Self-pay | Admitting: Physician Assistant

## 2014-05-09 ENCOUNTER — Encounter: Payer: Self-pay | Admitting: Nurse Practitioner

## 2014-05-09 ENCOUNTER — Ambulatory Visit (INDEPENDENT_AMBULATORY_CARE_PROVIDER_SITE_OTHER): Payer: Medicare Other | Admitting: Nurse Practitioner

## 2014-05-09 VITALS — BP 108/62 | HR 48 | Ht 69.0 in | Wt 203.0 lb

## 2014-05-09 DIAGNOSIS — Z955 Presence of coronary angioplasty implant and graft: Secondary | ICD-10-CM

## 2014-05-09 DIAGNOSIS — I739 Peripheral vascular disease, unspecified: Secondary | ICD-10-CM

## 2014-05-09 DIAGNOSIS — I10 Essential (primary) hypertension: Secondary | ICD-10-CM

## 2014-05-09 DIAGNOSIS — E785 Hyperlipidemia, unspecified: Secondary | ICD-10-CM

## 2014-05-09 DIAGNOSIS — Z23 Encounter for immunization: Secondary | ICD-10-CM

## 2014-05-09 DIAGNOSIS — I259 Chronic ischemic heart disease, unspecified: Secondary | ICD-10-CM

## 2014-05-09 DIAGNOSIS — I480 Paroxysmal atrial fibrillation: Secondary | ICD-10-CM

## 2014-05-09 NOTE — Progress Notes (Signed)
Damon [name] Date of Birth: [birth year] Medical Record #[id number]  History of Present Illness: Damon [name] is seen back today for a follow up visit. This is a 4 month check. Seen for Dr. [name]. He is a 68 year old male with prior AF ablation, HLD, MR, OSA on CPAP, PAD and HTN.   Seen here in February of 2015. Felt to be doing ok but noted to have claudication in the past. Referred to Dr. Fletcher Anon. His recommendations were as follows:  "The patient has evidence of underlying peripheral arterial disease with mild non-lifestyle limiting claudication. His ABI was normal on the right side moderately reduced on the left side with evidence of significant left SFA stenosis. Given that he is a nonsmoker and is not diabetic, I think the chance of progression is overall low. I suspect that his claudication will remain mild.  I discussed management options including medical therapy of risk factors versus proceeding with angiography and possible endovascular intervention. I recommend keeping the second option only for refractory symptoms. I advised him to start a walking program and regular basis. I will reevaluate his symptoms in 6 months.  He is already on good medical therapy for hypertension and hyperlipidemia."   I saw him in March of 2015 - He was here because of a chest CT that he had for a lung screening. Saw a message on MyChart. No active symptoms. No chest pain. No chest tightness. Wanted to know what he should do next. Has multiple CV risk factors for CAD. He was referred for Myoview after discussion with both Dr. Caryl Comes and Dr. Burt Knack. This turned out to be abnormal - he was then referred for cardiac cath with subsequent PCI to the LCX - has residual moderate LAD disease per Dr. Angelena Form in May of 2015.   Last seen by me in June - he was doing well from our standpoint.  Comes back today. Here alone. No chest pain. Not short of breath. No claudication. Rhythm good - just a rare palpitation. Not  dizzy or lightheaded. Riding his bike most days. Tolerating his medicines without issue. Has had labs earlier this fall with PCP and this looked ok.   Current Outpatient Prescriptions  Medication Sig Dispense Refill  . Ascorbic Acid (VITAMIN C) 1000 MG tablet Take 1,000 mg by mouth as needed.       Marland Kitchen aspirin 81 MG chewable tablet Chew 1 tablet (81 mg total) by mouth daily.      . bisoprolol-hydrochlorothiazide (ZIAC) 10-6.25 MG per tablet Take 1 tablet by mouth daily.  90 tablet  3  . cholecalciferol (VITAMIN D) 1000 UNITS tablet Take 5,000 Units by mouth daily.       . clopidogrel (PLAVIX) 75 MG tablet Take 75 mg by mouth daily with breakfast.      . Multiple Vitamin (MULITIVITAMIN WITH MINERALS) TABS Take 1 tablet by mouth daily. FOR MEN      . nitroGLYCERIN (NITROSTAT) 0.4 MG SL tablet Place 1 tablet (0.4 mg total) under the tongue every 5 (five) minutes as needed for chest pain.  25 tablet  2  . rosuvastatin (CRESTOR) 20 MG tablet Take 20 mg by mouth every evening.       . sodium chloride (MURO 128) 5 % ophthalmic solution Place 1 drop into both eyes every 4 (four) hours as needed for irritation.       No current facility-administered medications for this visit.    Allergies  Allergen Reactions  . Zetia [  Ezetimibe] Other (See Comments)    myalgias    Past Medical History  Diagnosis Date  . Hyperlipidemia   . Mitral regurgitation   . Bladder cancer   . OSA on CPAP   . Hearing loss of both ears   . History of colon polyps     BENIGN  . PAD (peripheral artery disease)     ABI--  RIGHT SIDE NORMAL LEFT SIDE MODERATELY REDUCED W/ DISTAL LEFT SFA STENOSIS//  MILD CALDICATION  . S/P ablation of atrial fibrillation     07-27-2012  . Anticoagulant long-term use     XARELTO  . Hypertension   . PAF (paroxysmal atrial fibrillation) dx  2007 /   PREVIOUS CARDIOVERTED X3  LAST ONE 07-27-2012  POST  ABLATION  07-27-2012    PRIMARY CARDIOLOGIST-  DR [name] CARDIO--  DR [name]   . Pre-diabetes   . Vitamin D deficiency     Past Surgical History  Procedure Laterality Date  . Total hip arthroplasty  07/25/2011    Procedure: TOTAL HIP ARTHROPLASTY ANTERIOR APPROACH;  Surgeon: Mcarthur Rossetti;  Location: [facility];  Service: Orthopedics;  Laterality: Right;  . Tee without cardioversion  07/26/2012    Procedure: TRANSESOPHAGEAL ECHOCARDIOGRAM (TEE);  Surgeon: [name], MD;  Location: [facility];  Service: Cardiovascular;  Laterality: N/A;  . Cardioversion  08/06/2012    Procedure: CARDIOVERSION;  Surgeon: [name], MD;  Location: [place];  Service: Cardiovascular;  Laterality: N/A;  . Total hip arthroplasty Left 02-08-2010  . Cataract extraction w/ intraocular lens  implant, bilateral    . Cardiac electrophysiology study and ablation  07-27-2012  DR ALLRED    SUCCESSFUL ABLATION OF A-FIB  . Tonsillectomy  age 58  . Cystoscopy w/ retrogrades Bilateral 04/12/2013    Procedure: CYSTOSCOPY WITH RETROGRADE PYELOGRAM;  Surgeon: [name], MD;  Location: [facility];  Service: Urology;  Laterality: Bilateral;  . Transurethral resection of bladder tumor with gyrus (turbt-gyrus) N/A 04/12/2013    Procedure: TRANSURETHRAL RESECTION OF BLADDER TUMOR WITH GYRUS (TURBT-GYRUS);  Surgeon: [name], MD;  Location: [facility];  Service: Urology;  Laterality: N/A;    History  Smoking status  . Former Smoker -- 2.00 packs/day for 10 years  . Types: Cigarettes  . Quit date: 09/20/1981  Smokeless tobacco  . Never Used    History  Alcohol Use No    Comment: pt states he has stopped drinking alcohol    Family History  Problem Relation Age of Onset  . Heart attack Mother   . Heart disease Mother   . Heart failure Father   . Heart disease Father     Review of Systems: The review of systems is per the HPI.  All other systems were reviewed and are negative.  Physical Exam: BP 108/62  Pulse 48  Ht '5\' 9"'  (1.753 m)  Wt  203 lb (92.08 kg)  BMI 29.96 kg/m2  SpO2 96% Patient is very pleasant and in no acute distress. Skin is warm and dry. Color is normal.  HEENT is unremarkable. Normocephalic/atraumatic. PERRL. Sclera are nonicteric. Neck is supple. No masses. No JVD. Lungs are clear. Cardiac exam shows a regular rate and rhythm. Abdomen is soft. Extremities are without edema. Gait and ROM are intact. No gross neurologic deficits noted.  Wt Readings from Last 3 Encounters:  05/09/14 203 lb (92.08 kg)  03/02/14 202 lb (91.627 kg)  01/10/14 207 lb 12.8 oz (94.257 kg)  LABORATORY DATA/PROCEDURES:  Lab Results  Component Value Date   WBC 5.8 03/02/2014   HGB 14.9 03/02/2014   HCT 42.5 03/02/2014   PLT 192 03/02/2014   GLUCOSE 110* 03/02/2014   CHOL 164 03/02/2014   TRIG 68 03/02/2014   HDL 66 03/02/2014   LDLCALC 84 03/02/2014   ALT 22 03/02/2014   AST 19 03/02/2014   NA 143 03/02/2014   K 4.1 03/02/2014   CL 108 03/02/2014   CREATININE 0.80 03/02/2014   BUN 18 03/02/2014   CO2 26 03/02/2014   TSH 0.757 03/02/2014   PSA 0.89 12/28/2013   INR 1.1* 11/16/2013   HGBA1C 5.3 12/28/2013   MICROALBUR 0.50 12/28/2013    BNP (last 3 results) No results found for this basename: PROBNP,  in the last 8760 hours  Procedure Performed:  1. Left Heart Catheterization 2. Selective Coronary Angiography 3. Left ventricular angiogram 4. PTCA proximal Circumflex with balloon angioplasty only, failed attempt to place stent due to calcified vessel. 5. Fractional flow reserve LAD Operator: Lauree Chandler, MD  Arterial access site: Right radial artery.  Indication: 68 yo male with HTN, HLD, former tobacco abuse, atrial fib with recent stress myoview with inferior ischemia, chest pain and dypsnea with exercise, ST changes c/w ischemia. He reports class II exertional angina/dyspnea.  Procedure Details:  The risks, benefits, complications, treatment options, and expected outcomes were discussed with the patient. The patient  and/or family concurred with the proposed plan, giving informed consent. The patient was brought to the cath lab after IV hydration was begun and oral premedication was given. The patient was further sedated with Versed and Fentanyl. The right wrist was assessed with an Allens test which was positive. The right wrist was prepped and draped in a sterile fashion. 1% lidocaine was used for local anesthesia. Using the modified Seldinger access technique, a 5 French sheath was placed in the right radial artery. 3 mg Verapamil was given through the sheath. 5000 units IV heparin was given. Standard diagnostic catheters were used to perform selective coronary angiography. A pigtail catheter was used to perform a left ventricular angiogram. He was found to have moderate LAD stenosis and severe proximal Circumflex stenosis. I elected to proceed to FFR of the LAD and PCI of the Circumflex.  PCI Note:  FFR of LAD: The patient was given an additional 4000 Units IV heparin. ACT was over 200. The left main was engaged with a XB LAD 3.5 guiding catheter. I then passed a flow wire down the LAD. Baseline FFR 0.93. With infusion of IV adenosine the FFR was 0.87 suggesting the moderate calcified mid LAD stenosis was not flow limiting. I then turned my attention to the Circumflex.  Lesion #1 proximal Circumflex: He was given 600 mg Plavix po x 1. I then advanced a Cougar IC wire down the Circumflex. I could not pass a balloon. I then used a Whisper wire as a buddy wire for support. I was then able to pass a 1.5 mm balloon into the proximal Circumflex and inflated to 12 atm x 2. I then passed a 2.0 x 12 mm balloon over the wire and inflated x 2 at 12 atm. I was unable to pass a stent over the wire. I tried to pass an Angiosculpt balloon into the vessel could not pass this device to calcification. I then passed a Mailman wire into the Circumflex for better support but could not pass the stent over this heavier wire. I had good flow  down the vessel so I stopped the case with plans to stage rotablator atherectomy next week.  The sheath was removed from the right radial artery and a Terumo hemostasis band was applied at the arteriotomy site on the right wrist. There were no immediate complications. The patient was taken to the recovery area in stable condition.  Hemodynamic Findings:  Central aortic pressure: 110/55  Left ventricular pressure: 106/7/14  Angiographic Findings:  Left main: Diffuse proximal and mid 20-30% stenosis.  Left Anterior Descending Artery: Large caliber vessel that courses to the apex. The vessel is heavily calcified in the proximal and mid segments. The mid vessel has a 50% heavily calcified stenosis. The distal vessel has diffuse plaque. The first diagonal branch is very small in caliber with ostial 50% stenosis. The second diagonal branch is moderate in caliber with mild plaque.  Circumflex Artery: Large vessel with heavy calcification in the proximal segment. There is a 99% proximal stenosis just beyond the ostium. The first OM branch is patent with mild plaque. The continuation of the AV groove Circumflex has a 40% stenosis.  Right Coronary Artery: Large dominant vessel with diffuse 20% stenosis, moderate proximal and mid calcification.  Left Ventricular Angiogram: LVEF=55-60%.  Impression:  1. Severe single vessel CAD with heavily calcified stenosis in the proximal Circumflex, likely culprit lesion for unstable angina  2. PTCA of the Circumflex with some improvement in luminal narrowing but unable to deliver stent due to calcification  3. Moderate LAD stenosis with FFR demonstrating adequate flow down vessel  4. Normal LV systolic function  Recommendations: Will continue ASA and Plavix. Will plan to bring back in 1-2 weeks for staged PCI with rotablator atherectomy of the proximal Circumflex. Continue statin, beta blocker. Monitor overnight and discharge home in am if stable.  Complications: None. The  patient tolerated the procedure well.     Procedure Performed:  1. Rotablator atherectomy circumflex  2. PTCA/DES x 1 proximal Circumflex  3. Angioseal RFA  Operator: Lauree Chandler, MD  Indication: 68 yo male with history of HTN, CAD with recent dyspnea and fatigue c/w class III angina. Stress myoview with ischemia, intermediate risk. Cath last week with severe stenosis proximal Circumflex and attempted PCI of Circumflex, unable to deliver stent.  Procedure Details:  The risks, benefits, complications, treatment options, and expected outcomes were discussed with the patient. The patient and/or family concurred with the proposed plan, giving informed consent. The patient was brought to the cath lab after IV hydration was begun and oral premedication was given. The patient was further sedated with Versed and Fentanyl. The right groin was prepped and draped in the usual manner. Using the modified Seldinger access technique, a 7 French sheath was placed in the right femoral artery. A 6 French sheath was placed in the right femoral vein. He had been loaded with Plavix. He was given a weight based bolus of Angiomax and a drip was started. I then engaged the left main with a CLS 3.5 7Frguide. A temporary pacing wire was placed in the RV. When the ACT was over 200, I passed a rotafloppy wire down the Circumflex. I then made 4 runs with a 1.5 mm rotablator burr. A 2.75 x 8 mm Pillager balloon was inflated x 2 in the proximal vessel. I then carefully positioned and deployed a 3.0 x 12 mm Resolute Integrity DES in the proximal Circumflex. The stent was post-dilated with a 3.25 x 8 mm [place] balloon x 1. The stenosis was taken from 99% down  to 0%.  There were no immediate complications. The patient was taken to the recovery area in stable condition.  Hemodynamic Findings:  Central aortic pressure: 98/30  Impression:  1. Unstable angina (class III)  2. Severe, calcified stenosis proximal Circumflex  3. Successful  rotablator atherectomy followed by PTCA/DES proximal Circumflex  Recommendations: He will need to be continued on ASA and Plavix. He had been on Xarelto following atrial fibrillation ablation but has been told by Dr. [name] that he no longer needs to be anti-coagulated. He has had no recurrence of atrial fibrillation. Will not restart Xarelto. Hopefully d/c home in am.  Complications: None. The patient tolerated the procedure well.     Assessment / Plan:  1. Post PCI to the LCX with rotablator atherectomy followed by PTCA/DES from May 2015 - residual disease in the LAD to be treated medically. Doing well clinically. Seeing Dr. [name] in February.  Continue with CV risk factor modification.   2. PAF - rhythm ok on exam today.   3. PVD - no symptoms at this time.   4. HLD - on statin - recent lipids noted.   Flu shot today. No change in his current regimen. See back in February as planned.  Patient is agreeable to this plan and will call if any problems develop in the interim.   Burtis Junes, RN, [facility] [address] [place], [place]  [address] [phone]

## 2014-05-09 NOTE — Patient Instructions (Addendum)
Stay on your current medicines  We will give you a flu shot today  See Dr. [name] in February of 2016  Call the [place] office at [phone] if you have any questions, problems or concerns.

## 2014-05-17 ENCOUNTER — Other Ambulatory Visit: Payer: Self-pay | Admitting: Internal Medicine

## 2014-06-26 ENCOUNTER — Other Ambulatory Visit: Payer: Self-pay

## 2014-06-26 MED ORDER — ROSUVASTATIN CALCIUM 40 MG PO TABS: 40.0000 mg | ORAL_TABLET | Freq: Every day | ORAL | Status: DC

## 2014-06-29 ENCOUNTER — Encounter (HOSPITAL_COMMUNITY): Payer: Self-pay | Admitting: Internal Medicine

## 2014-07-11 ENCOUNTER — Encounter: Payer: Self-pay | Admitting: Physician Assistant

## 2014-07-11 ENCOUNTER — Ambulatory Visit (INDEPENDENT_AMBULATORY_CARE_PROVIDER_SITE_OTHER): Payer: Medicare Other | Admitting: Physician Assistant

## 2014-07-11 VITALS — BP 102/60 | HR 60 | Temp 98.1°F | Resp 16 | Ht 68.0 in | Wt 208.0 lb

## 2014-07-11 DIAGNOSIS — R7303 Prediabetes: Secondary | ICD-10-CM

## 2014-07-11 DIAGNOSIS — I480 Paroxysmal atrial fibrillation: Secondary | ICD-10-CM

## 2014-07-11 DIAGNOSIS — E782 Mixed hyperlipidemia: Secondary | ICD-10-CM

## 2014-07-11 DIAGNOSIS — Z23 Encounter for immunization: Secondary | ICD-10-CM

## 2014-07-11 DIAGNOSIS — I10 Essential (primary) hypertension: Secondary | ICD-10-CM

## 2014-07-11 DIAGNOSIS — Z79899 Other long term (current) drug therapy: Secondary | ICD-10-CM

## 2014-07-11 DIAGNOSIS — E559 Vitamin D deficiency, unspecified: Secondary | ICD-10-CM

## 2014-07-11 DIAGNOSIS — R7309 Other abnormal glucose: Secondary | ICD-10-CM

## 2014-07-11 LAB — HEPATIC FUNCTION PANEL
ALBUMIN: 4.1 g/dL (ref 3.5–5.2)
ALT: 22 U/L (ref 0–53)
AST: 20 U/L (ref 0–37)
Alkaline Phosphatase: 44 U/L (ref 39–117)
Bilirubin, Direct: 0.3 mg/dL (ref 0.0–0.3)
Indirect Bilirubin: 1.4 mg/dL — ABNORMAL HIGH (ref 0.2–1.2)
TOTAL PROTEIN: 6 g/dL (ref 6.0–8.3)
Total Bilirubin: 1.7 mg/dL — ABNORMAL HIGH (ref 0.2–1.2)

## 2014-07-11 LAB — CBC WITH DIFFERENTIAL/PLATELET
Basophils Absolute: 0 10*3/uL (ref 0.0–0.1)
Basophils Relative: 0 % (ref 0–1)
EOS PCT: 2 % (ref 0–5)
Eosinophils Absolute: 0.2 10*3/uL (ref 0.0–0.7)
HCT: 42.3 % (ref 39.0–52.0)
Hemoglobin: 14.9 g/dL (ref 13.0–17.0)
LYMPHS ABS: 1.5 10*3/uL (ref 0.7–4.0)
LYMPHS PCT: 17 % (ref 12–46)
MCH: 30.5 pg (ref 26.0–34.0)
MCHC: 35.2 g/dL (ref 30.0–36.0)
MCV: 86.5 fL (ref 78.0–100.0)
MPV: 9.1 fL — ABNORMAL LOW (ref 9.4–12.4)
Monocytes Absolute: 0.5 10*3/uL (ref 0.1–1.0)
Monocytes Relative: 6 % (ref 3–12)
Neutro Abs: 6.7 10*3/uL (ref 1.7–7.7)
Neutrophils Relative %: 75 % (ref 43–77)
Platelets: 206 10*3/uL (ref 150–400)
RBC: 4.89 MIL/uL (ref 4.22–5.81)
RDW: 13.6 % (ref 11.5–15.5)
WBC: 8.9 10*3/uL (ref 4.0–10.5)

## 2014-07-11 LAB — BASIC METABOLIC PANEL WITH GFR
BUN: 12 mg/dL (ref 6–23)
CHLORIDE: 108 meq/L (ref 96–112)
CO2: 26 meq/L (ref 19–32)
CREATININE: 0.84 mg/dL (ref 0.50–1.35)
Calcium: 9.1 mg/dL (ref 8.4–10.5)
GFR, Est African American: >89 mL/min
GFR, Est Non African American: 89 mL/min
GLUCOSE: 86 mg/dL (ref 70–99)
Potassium: 3.9 mEq/L (ref 3.5–5.3)
Sodium: 142 mEq/L (ref 135–145)

## 2014-07-11 LAB — LIPID PANEL
Cholesterol: 136 mg/dL (ref 0–200)
HDL: 55 mg/dL (ref >39)
LDL Cholesterol: 65 mg/dL (ref 0–99)
Total CHOL/HDL Ratio: 2.5 Ratio
Triglycerides: 78 mg/dL (ref <150)
VLDL: 16 mg/dL (ref 0–40)

## 2014-07-11 LAB — MAGNESIUM: Magnesium: 1.8 mg/dL (ref 1.5–2.5)

## 2014-07-11 NOTE — Progress Notes (Signed)
Assessment and Plan:  Hypertension: Continue medication, monitor blood pressure at home. Continue DASH diet.  Reminder to go to the ER if any CP, SOB, nausea, dizziness, severe HA, changes vision/speech, left arm numbness and tingling, and jaw pain. Cholesterol: Continue diet and exercise. Check cholesterol.  Pre-diabetes-Continue diet and exercise. Check A1C Vitamin D Def- check level and continue medications.  CAD-Control blood pressure, cholesterol, glucose, increase exercise.  PAD- continue walking, bASA Obesity with co morbidities- long discussion about weight loss, diet, and exercise   Continue diet and meds as discussed. Further disposition pending results of labs.  HPI 68 y.o. male  presents for 3 month follow up with hypertension, hyperlipidemia, prediabetes and vitamin D. His blood pressure has been controlled at home, today their BP is BP: 102/60 mmHg He does workout, did not for a while due to having a bacterial infection on his back and being on levaquin. He denies chest pain, shortness of breath, dizziness.  Has history of PTCA/DES in May 2015, pAFib s/p ablation in 2014 and is no longer on anticoagulation per Dr. [name], PAD follows with Dr. Ruben Im, NP. He is on ASA and plavix, has NTG but does not use it.  He is on cholesterol medication, crestor 40mg  (1/2 daily) and denies myalgias. His cholesterol is at goal of less than 70. The cholesterol last visit was:   Lab Results  Component Value Date   CHOL 164 03/02/2014   HDL 66 03/02/2014   LDLCALC 84 03/02/2014   TRIG 68 03/02/2014   CHOLHDL 2.5 03/02/2014   He has been working on diet and exercise for prediabetes, and denies polyuria and visual disturbances. Last A1C in the office was:  Lab Results  Component Value Date   HGBA1C 5.3 12/28/2013   Patient is on Vitamin D supplement.   Lab Results  Component Value Date   VD25OH 84 03/02/2014     BMI is Body mass index is 31.63 kg/(m^2)., he is working on  diet and exercise. Wt Readings from Last 3 Encounters:  07/11/14 208 lb (94.348 kg)  05/09/14 203 lb (92.08 kg)  03/02/14 202 lb (91.627 kg)    Current Medications:  Current Outpatient Prescriptions on File Prior to Visit  Medication Sig Dispense Refill  . Ascorbic Acid (VITAMIN C) 1000 MG tablet Take 1,000 mg by mouth as needed.     Marland Kitchen aspirin 81 MG chewable tablet Chew 1 tablet (81 mg total) by mouth daily.    . bisoprolol-hydrochlorothiazide (ZIAC) 10-6.25 MG per tablet Take 1 tablet by mouth daily. 90 tablet 3  . cholecalciferol (VITAMIN D) 1000 UNITS tablet Take 5,000 Units by mouth daily.     . clopidogrel (PLAVIX) 75 MG tablet Take 75 mg by mouth daily with breakfast.    . Multiple Vitamin (MULITIVITAMIN WITH MINERALS) TABS Take 1 tablet by mouth daily. FOR MEN    . nitroGLYCERIN (NITROSTAT) 0.4 MG SL tablet Place 1 tablet (0.4 mg total) under the tongue every 5 (five) minutes as needed for chest pain. 25 tablet 2  . rosuvastatin (CRESTOR) 40 MG tablet Take 1 tablet (40 mg total) by mouth daily. 30 tablet 0  . sodium chloride (MURO 128) 5 % ophthalmic solution Place 1 drop into both eyes every 4 (four) hours as needed for irritation.     No current facility-administered medications on file prior to visit.   Medical History:  Past Medical History  Diagnosis Date  . Hyperlipidemia   . Mitral regurgitation   . Bladder  cancer   . OSA on CPAP   . Hearing loss of both ears   . History of colon polyps     BENIGN  . PAD (peripheral artery disease)     ABI--  RIGHT SIDE NORMAL LEFT SIDE MODERATELY REDUCED W/ DISTAL LEFT SFA STENOSIS//  MILD CALDICATION  . S/P ablation of atrial fibrillation     07-27-2012  . Anticoagulant long-term use     XARELTO  . Hypertension   . PAF (paroxysmal atrial fibrillation) dx  2007 /   PREVIOUS CARDIOVERTED X3  LAST ONE 07-27-2012  POST  ABLATION  07-27-2012    PRIMARY CARDIOLOGIST-  DR [name] CARDIO--  DR [name]  . Pre-diabetes   .  Vitamin D deficiency    Allergies:  Allergies  Allergen Reactions  . Zetia [Ezetimibe] Other (See Comments)    myalgias     Review of Systems:  Review of Systems  Constitutional: Negative.   HENT: Negative.   Eyes: Negative.   Respiratory: Negative.  Negative for shortness of breath.   Cardiovascular: Positive for claudication and leg swelling. Negative for chest pain, palpitations, orthopnea and PND.  Gastrointestinal: Negative.   Genitourinary: Negative.   Musculoskeletal: Negative.   Skin: Negative.   Neurological: Negative.   Psychiatric/Behavioral: Negative.    Family history- Review and unchanged Social history- Review and unchanged Physical Exam: BP 102/60 mmHg  Pulse 60  Temp(Src) 98.1 F (36.7 C)  Resp 16  Ht 5\' 8"  (1.727 m)  Wt 208 lb (94.348 kg)  BMI 31.63 kg/m2 Wt Readings from Last 3 Encounters:  07/11/14 208 lb (94.348 kg)  05/09/14 203 lb (92.08 kg)  03/02/14 202 lb (91.627 kg)   General Appearance: Well nourished, in no apparent distress. Eyes: PERRLA, EOMs, conjunctiva no swelling or erythema Sinuses: No Frontal/maxillary tenderness ENT/Mouth: Ext aud canals clear, TMs without erythema, bulging. No erythema, swelling, or exudate on post pharynx.  Tonsils not swollen or erythematous. Hearing normal.  Neck: Supple, thyroid normal.  Respiratory: Respiratory effort normal, BS equal bilaterally without rales, rhonchi, wheezing or stridor.  Cardio: RRR with no MRGs. Brisk peripheral pulses with 1-2 + edema.  Abdomen: Soft, + BS.  Non tender, no guarding, rebound, hernias, masses. Lymphatics: Non tender without lymphadenopathy.  Musculoskeletal: Full ROM, 5/5 strength, normal gait.  Skin: Warm, dry without rashes, lesions, ecchymosis.  Neuro: Cranial nerves intact. Normal muscle tone, no cerebellar symptoms. Sensation intact.  Psych: Awake and oriented X 3, normal affect, Insight and Judgment appropriate.    Vicie Mutters, PA-C 1:27 PM [facility]

## 2014-07-11 NOTE — Patient Instructions (Signed)
Your LDL is not in range. Your LDL is the bad cholesterol that can lead to heart attack and stroke. To lower your number you can decrease your fatty foods, red meat, cheese, milk and increase fiber like whole grains and veggies. You can also add a fiber supplement like Metamucil or Benefiber.   Benefiber is good for constipation/diarrhea/irritable bowel syndrome, it helps with weight loss and can help lower your bad cholesterol. Please do 1-2 TBSP in the morning in water, coffee, or tea. It can take up to a month before you can see a difference with your bowel movements. It is cheapest from [employer]co, sam's, [employer].   Intermittent Claudication Blockage of leg arteries results from poor circulation of blood in the leg arteries. This produces an aching, tired, and sometimes burning pain in the legs that is brought on by exercise and made better by rest. Claudication refers to the limping that happens from leg cramps. It is also referred to as Vaso-occlusive disease of the legs, arterial insufficiency of the legs, recurrent leg pain, recurrent leg cramping and calf pain with exercise.  CAUSES  This condition is due to narrowing or blockage of the arteries (muscular vessels which carry blood away from the heart and around the body). Blockage of arteries can occur anywhere in the body. If they occur in the heart, a person may experience angina (chest pain) or even a heart attack. If they occur in the neck or the brain, a person may have a stroke. Intermittent claudication is when the blockage occurs in the legs, most commonly in the calf or the foot.  Atherosclerosis, or blockage of arteries, can occur for many reasons. Some of these are smoking, diabetes, and high cholesterol. SYMPTOMS  Intermittent claudication may occur in both legs, and it often continues to get worse over time. However, some people complain only of weakness in the legs when walking, or a feeling of "tiredness" in the buttocks. Impotence  (not able to have an erection) is an occasional complaint in men. Pain while resting is uncommon.  WHAT TO EXPECT AT [facility] PROVIDER'S OFFICE: Your medical history will be asked for and a physical examination will be performed. Medical history questions documenting claudication in detail may include:   Time pattern  Do you have leg cramps at night (nocturnal cramps)?  How often does leg pain with cramping occur?  Is it getting worse?  What is the quality of the pain?  Is the pain sharp?  Is there an aching pain with the cramps?  Aggravating factors  Is it worse after you exercise?  Is it worse after you are standing for a while?  Do you smoke? How much?  Do you drink alcohol? How much?  Are you diabetic? How well is your blood sugar controlled?  Other  What other symptoms are also present?  Has there been impotence (men)?  Is there pain in the back?  Is there a darkening of the skin of the legs, feet or toes?  Is there weakness or paralysis of the legs? The physical examination may include evaluation of the femoral pulse (in the groin) and the other areas where the pulse can be felt in the legs. DIAGNOSIS  Diagnostic tests that may be performed include:  Blood pressure measured in arms and legs for comparison.  Doppler ultrasonography on the legs and the heart.  Duplex Doppler/ultrasound exam of extremity to visualize arterial blood flow.  ECG- to evaluate the activity of your heart.  Aortography- to visualize blockages in your arteries. TREATMENT Surgical treatment may be suggested if claudication interferes with the patient's activities or work, and if the diseased arteries do not seem to be improving after treatment. Be aware that this condition can worsen over time and you should carefully monitor your condition. HOME CARE INSTRUCTIONS  Talk to your caregiver about the cause of your leg cramping and about what to do at home to relieve it.  A  healthy diet is important to lessen the likeliness of atherosclerosis.  A program of daily walking for short periods, and stopping for pain or cramping, may help improve function.  It is important to stop smoking.  Avoid putting hot or cold items on legs.  Avoid tight shoes. SEEK MEDICAL CARE IF: There are many other causes of leg pain such as arthritis or low blood potassium. However, some causes of leg pain may be life threatening such as a blood clot in the legs. Seek medical attention if you have:  Leg pain that does not go away.  Legs that may be red, hot or swollen.  Ulcers or sores appear on your ankle or foot.  Any chest pain or shortness of breath accompanying leg pain.  Diabetes.  You are pregnant. SEEK IMMEDIATE MEDICAL CARE IF:   Your leg pain becomes severe or will not go away.  Your foot turns blue or a dark color.  Your leg becomes red, hot or swollen or you develop a fever over 102F.  Any chest pain or shortness of breath accompanying leg pain. MAKE SURE YOU:   Understand these instructions.  Will watch your condition.  Will get help right away if you are not doing well or get worse. Document Released: 05/09/2004 Document Revised: 09/29/2011 Document Reviewed: 10/13/2013 [facility] Patient Information 2015 [place], [place]. This information is not intended to replace advice given to you by your health care provider. Make sure you discuss any questions you have with your health care provider.

## 2014-07-12 LAB — VITAMIN D 25 HYDROXY (VIT D DEFICIENCY, FRACTURES): Vit D, 25-Hydroxy: 64 ng/mL (ref 30–100)

## 2014-07-12 LAB — TSH: TSH: 0.689 u[IU]/mL (ref 0.350–4.500)

## 2014-07-18 ENCOUNTER — Ambulatory Visit: Payer: Self-pay | Admitting: Internal Medicine

## 2014-08-15 ENCOUNTER — Telehealth: Payer: Self-pay | Admitting: Internal Medicine

## 2014-08-15 NOTE — Telephone Encounter (Signed)
Will have Melissa call and offer him to morrow with Roderic Palau, NP

## 2014-08-15 NOTE — Telephone Encounter (Signed)
On to see Butch Penny at 9:30am

## 2014-08-15 NOTE — Telephone Encounter (Signed)
New problem   Pt stated his heart is in afib and want to speak to nurse. Offered pt an appt for next week but he declined. Please call pt.

## 2014-08-16 ENCOUNTER — Encounter: Payer: Self-pay | Admitting: Nurse Practitioner

## 2014-08-16 ENCOUNTER — Ambulatory Visit (INDEPENDENT_AMBULATORY_CARE_PROVIDER_SITE_OTHER): Payer: Medicare Other | Admitting: Nurse Practitioner

## 2014-08-16 VITALS — BP 128/76 | HR 87 | Ht 69.0 in | Wt 207.0 lb

## 2014-08-16 DIAGNOSIS — I4819 Other persistent atrial fibrillation: Secondary | ICD-10-CM

## 2014-08-16 DIAGNOSIS — I481 Persistent atrial fibrillation: Secondary | ICD-10-CM

## 2014-08-16 MED ORDER — DILTIAZEM HCL ER COATED BEADS 120 MG PO CP24: 120.0000 mg | ORAL_CAPSULE | Freq: Every day | ORAL | Status: DC

## 2014-08-16 MED ORDER — RIVAROXABAN 20 MG PO TABS: 20.0000 mg | ORAL_TABLET | Freq: Every day | ORAL | Status: DC

## 2014-08-16 NOTE — Patient Instructions (Addendum)
Your physician has recommended you make the following change in your medication:   1.) STOP ASPIRIN 2.) START DILTIAZEM 120 MG (CARDIZEM CD) ONCE DAILY 3.) RIVAROXABAN 20 MG (XARELTO) ONCE DAILY (WITH DINNER)  YOU MAY USE PLAIN ROBITUSSIN OR [place] WITHOUT DECONGESTANT  Your physician recommends that you return for a follow up appointment Wednesday February 3 AT 2:30 PM WITH DR [name].

## 2014-08-16 NOTE — Progress Notes (Signed)
PCP: Damon Richards, MD Referring Physician: Dr Caryl Comes  Primary Cardiologist: Dr Austin Miles is a 69 y.o. male who presents today for  electrophysiology follow up for return of afib this past weekend, first episode since his ablation in 1/ 2014.Damon KitchenHe was in his usual state of health until he developed a cold and head congestion and took over the counter decongestants and ate a lot of chocolate ice cream. He is minimally symptomatic with just the sensation of irregular heart beat. Ekg shows rate controlled afib.  He was last seen 6/14 by Dr. [name] at which time chads2vasc score was 2, he was maintaining SR and pt wanted to stop NOAC, although  he was encouraged to continue. Was on diltiazem which was stopped after ablation.  Since last being seen in our clinic last, the patient had LHC in May 2015, receiving PCI and had successful rotablator atherectomy followed by PTCA/DES proximal Circumflex. He was continued on ASA and Plavix. Xarelto discontinued because he has had no reoccurrence of afib after his ablation.   He now has a chads2vasc score of 3 (AGE, HTN, CAD)    Today, he denies symptoms of palpitations, chest pain, shortness of breath,  lower extremity edema, dizziness, presyncope, or syncope.  He reports frequent calf pain with moderate activity.  The patient is otherwise without complaint today.   Past Medical History  Diagnosis Date  . Hyperlipidemia   . Mitral regurgitation   . Bladder cancer   . OSA on CPAP   . Hearing loss of both ears   . History of colon polyps     BENIGN  . PAD (peripheral artery disease)     ABI--  RIGHT SIDE NORMAL LEFT SIDE MODERATELY REDUCED W/ DISTAL LEFT SFA STENOSIS//  MILD CALDICATION  . S/P ablation of atrial fibrillation     07-27-2012  . Anticoagulant long-term use     XARELTO  . Hypertension   . PAF (paroxysmal atrial fibrillation) dx  2007 /   PREVIOUS CARDIOVERTED X3  LAST ONE 07-27-2012  POST  ABLATION  07-27-2012     PRIMARY CARDIOLOGIST-  DR [name] CARDIO--  DR [name]  . Pre-diabetes   . Vitamin D deficiency    Past Surgical History  Procedure Laterality Date  . Total hip arthroplasty  07/25/2011    Procedure: TOTAL HIP ARTHROPLASTY ANTERIOR APPROACH;  Surgeon: Mcarthur Rossetti;  Location: [facility];  Service: Orthopedics;  Laterality: Right;  . Tee without cardioversion  07/26/2012    Procedure: TRANSESOPHAGEAL ECHOCARDIOGRAM (TEE);  Surgeon: [name], MD;  Location: [facility] ENDOSCOPY;  Service: Cardiovascular;  Laterality: N/A;  . Cardioversion  08/06/2012    Procedure: CARDIOVERSION;  Surgeon: [name], MD;  Location: [facility];  Service: Cardiovascular;  Laterality: N/A;  . Total hip arthroplasty Left 02-08-2010  . Cataract extraction w/ intraocular lens  implant, bilateral    . Cardiac electrophysiology study and ablation  07-27-2012  DR ALLRED    SUCCESSFUL ABLATION OF A-FIB  . Tonsillectomy  age 15  . Cystoscopy w/ retrogrades Bilateral 04/12/2013    Procedure: CYSTOSCOPY WITH RETROGRADE PYELOGRAM;  Surgeon: [name], MD;  Location: [facility];  Service: Urology;  Laterality: Bilateral;  . Transurethral resection of bladder tumor with gyrus (turbt-gyrus) N/A 04/12/2013    Procedure: TRANSURETHRAL RESECTION OF BLADDER TUMOR WITH GYRUS (TURBT-GYRUS);  Surgeon: [name], MD;  Location: [facility];  Service: Urology;  Laterality: N/A;  .  Atrial fibrillation ablation N/A 07/27/2012    Procedure: ATRIAL FIBRILLATION ABLATION;  Surgeon: [name], MD;  Location: [facility] CATH LAB;  Service: Cardiovascular;  Laterality: N/A;  . Left heart catheterization with coronary angiogram N/A 11/24/2013    Procedure: LEFT HEART CATHETERIZATION WITH CORONARY ANGIOGRAM;  Surgeon: [name], MD;  Location: [facility] CATH LAB;  Service: Cardiovascular;  Laterality: N/A;  . Percutaneous coronary stent intervention (pci-s)  11/24/2013    Procedure: PERCUTANEOUS  CORONARY STENT INTERVENTION (PCI-S);  Surgeon: [name], MD;  [facility] CATH LAB;  Service: Cardiovascular;;  . Percutaneous coronary rotoblator intervention (pci-r) N/A 11/28/2013    Procedure: PERCUTANEOUS CORONARY ROTOBLATOR INTERVENTION (PCI-R);  Surgeon: [name], MD;  Location: [facility] CATH LAB;  Service: Cardiovascular;  Laterality: N/A;    Current Outpatient Prescriptions  Medication Sig Dispense Refill  . Ascorbic Acid (VITAMIN C) 1000 MG tablet Take 1,000 mg by mouth as directed.     . bisoprolol-hydrochlorothiazide (ZIAC) 10-6.25 MG per tablet Take 1 tablet by mouth daily. 90 tablet 3  . cholecalciferol (VITAMIN D) 1000 UNITS tablet Take 5,000 Units by mouth daily.     . clopidogrel (PLAVIX) 75 MG tablet Take 75 mg by mouth daily with breakfast.    . diltiazem (CARDIZEM CD) 120 MG 24 hr capsule Take 1 capsule (120 mg total) by mouth daily. 30 capsule 11  . Multiple Vitamin (MULITIVITAMIN WITH MINERALS) TABS Take 1 tablet by mouth daily. FOR MEN    . nitroGLYCERIN (NITROSTAT) 0.4 MG SL tablet Place 1 tablet (0.4 mg total) under the tongue every 5 (five) minutes as needed for chest pain. 25 tablet 2  . rivaroxaban (XARELTO) 20 MG TABS tablet Take 1 tablet (20 mg total) by mouth daily with supper. 30 tablet 11  . rosuvastatin (CRESTOR) 40 MG tablet Take 1 tablet (40 mg total) by mouth daily. 30 tablet 0  . sodium chloride (MURO 128) 5 % ophthalmic solution Place 1 drop into both eyes every 4 (four) hours as needed for irritation.     No current facility-administered medications for this visit.    Physical Exam: Filed Vitals:   08/16/14 0932  BP: 128/76  Pulse: 87  Height: 5\' 9"  (1.753 m)  Weight: 207 lb (93.895 kg)    GEN- The patient is well appearing, alert and oriented x 3 today.   Head- normocephalic, atraumatic Eyes-  Sclera clear, conjunctiva pink Ears- hearing intact Oropharynx- clear Lungs- Clear to ausculation bilaterally, normal work of  breathing Heart- Irregular rate and rhythm, no murmurs, rubs or gallops, PMI not laterally displaced GI- soft, NT, ND, + BS Extremities- no clubbing, cyanosis, or edema  ekg today reveals afib at 87 bpm,    Assessment and Plan:  1. afib s/p ablation Maintained sinus rhythm until last few days, possibly triggered by OTC decongestants and chocolate ice cream. Continue bb, add back Cardizem cd 120 mg to encourage return to SR.   2.  Chads2vasc score is now 3 with DES stent May 2015. Spoke to DOD, Dr. Burt Knack and he recommended to stop asa and add back Xarelto 20 mg qd, continue plavix, so pt would be at less risk of bleeding from triple therapy.  3. Can take plain muccinex for sinus congestion, avoid decongestants.  4. F/u with Dr. [name] 08-23-14.

## 2014-08-18 ENCOUNTER — Encounter: Payer: Self-pay | Admitting: Physician Assistant

## 2014-08-18 ENCOUNTER — Ambulatory Visit (INDEPENDENT_AMBULATORY_CARE_PROVIDER_SITE_OTHER): Payer: 59 | Admitting: Physician Assistant

## 2014-08-18 VITALS — BP 140/82 | HR 88 | Temp 99.0°F | Resp 16 | Ht 68.0 in | Wt 213.0 lb

## 2014-08-18 DIAGNOSIS — J0100 Acute maxillary sinusitis, unspecified: Secondary | ICD-10-CM

## 2014-08-18 MED ORDER — AZITHROMYCIN 250 MG PO TABS: ORAL_TABLET | ORAL | Status: DC

## 2014-08-18 NOTE — Patient Instructions (Signed)
Sinusitis can be uncomfortable. People with sinusitis have congestion with yellow/green/gray discharge, sinus pain/pressure, pain around the eyes. Sinus infections almost ALWAYS stem from a viral infection and antibiotics don't work against a virus. Even when bacteria is responsible, the infections usually clear up on their own in a week or so.   PLEASE TRY TO DO OVER THE COUNTER TREATMENT AND PREDNISONE FOR 5-7 DAYS AND IF YOU ARE NOT GETTING BETTER OR GETTING WORSE THEN YOU CAN START ON AN ANTIBIOTIC GIVEN.  Can take the prednisone AT NIGHT WITH DINNER, it take 8-12 hours to start working so it will NOT affect your sleeping if you take it at night with your food!! Take two pills the first night and 1 or two pill the second night and then 1 pill the other nights.   Risk of antibiotic use: About 1 in 4 people who take antibiotics have side effects including stomach problems, dizziness, or rashes. Those problems clear up soon after stopping the drugs, but in rare cases antibiotics can cause severe allergic reaction. Over use of antibiotics also encourages the growth of bacteria that can't be controlled easily with drugs. That makes you more vunerable to antibiotic-resistant infections and undermines the benefits of antibiotics for others.   Waste of Money: Antibiotics often aren't very expensive, but any money spent on unnecessary drugs is money down the drain.   When are antibiotics needed? Only when symptoms last longer than a week.  Start to improve but then worsen again  -It can take up to 2 weeks to feel better.   -If you do not get better in 7-10 days (Have fever, facial pain, dental pain and swelling), then please call the office and it is now appropriate to start an antibiotic.   -Please take Tylenol or Ibuprofen for pain. -Acetaminiphen 325mg orally every 4-6 hours for pain.  Max: 10 per day -Ibuprofen 200mg orally every 6-8 hours for pain.  Take with food to avoid ulcers.   Max 10 per  day  Please pick one of the over the counter allergy medications below and take it once daily for allergies.  Claritin or loratadine cheapest but likely the weakest  Zyrtec or certizine at night because it can make you sleepy The strongest is allegra or fexafinadine  Cheapest at [employer]mart, [employer], [employer]co  -While drinking fluids, pinch and hold nose close and swallow.  This will help open up your eustachian tubes to drain the fluid behind your ear drums. -Try steam showers to open your nasal passages.   Drink lots of water to stay hydrated and to thin mucous.  Flonase/Nasonex is to help the inflammation.  Take 2 sprays in each nostril at bedtime.  Make sure you spray towards the outside of each nostril towards the outer corner of your eye, hold nose close and tilt head back.  This will help the medication get into your sinuses.  If you do not like this medication, then use saline nasal sprays same directions as above for Flonase. Stop the medication right away if you get blurring of your vision or nose bleeds.  Sinusitis Sinusitis is redness, soreness, and inflammation of the paranasal sinuses. Paranasal sinuses are air pockets within the bones of your face (beneath the eyes, the middle of the forehead, or above the eyes). In healthy paranasal sinuses, mucus is able to drain out, and air is able to circulate through them by way of your nose. However, when your paranasal sinuses are inflamed, mucus and air can   become trapped. This can allow bacteria and other germs to grow and cause infection. Sinusitis can develop quickly and last only a short time (acute) or continue over a long period (chronic). Sinusitis that lasts for more than 12 weeks is considered chronic.  CAUSES  Causes of sinusitis include: Allergies. Structural abnormalities, such as displacement of the cartilage that separates your nostrils (deviated septum), which can decrease the air flow through your nose and sinuses and affect sinus  drainage. Functional abnormalities, such as when the small hairs (cilia) that line your sinuses and help remove mucus do not work properly or are not present. SIGNS AND SYMPTOMS  Symptoms of acute and chronic sinusitis are the same. The primary symptoms are pain and pressure around the affected sinuses. Other symptoms include: Upper toothache. Earache. Headache. Bad breath. Decreased sense of smell and taste. A cough, which worsens when you are lying flat. Fatigue. Fever. Thick drainage from your nose, which often is green and may contain pus (purulent). Swelling and warmth over the affected sinuses. DIAGNOSIS  Your health care provider will perform a physical exam. During the exam, your health care provider may: Look in your nose for signs of abnormal growths in your nostrils (nasal polyps).  Tap over the affected sinus to check for signs of infection. View the inside of your sinuses (endoscopy) using an imaging device that has a light attached (endoscope). If your health care provider suspects that you have chronic sinusitis, one or more of the following tests may be recommended: Allergy tests. Nasal culture. A sample of mucus is taken from your nose, sent to a lab, and screened for bacteria. Nasal cytology. A sample of mucus is taken from your nose and examined by your health care provider to determine if your sinusitis is related to an allergy. TREATMENT  Most cases of acute sinusitis are related to a viral infection and will resolve on their own within 10 days. Sometimes medicines are prescribed to help relieve symptoms (pain medicine, decongestants, nasal steroid sprays, or saline sprays).  However, for sinusitis related to a bacterial infection, your health care provider will prescribe antibiotic medicines. These are medicines that will help kill the bacteria causing the infection.  Rarely, sinusitis is caused by a fungal infection. In theses cases, your health care provider will  prescribe antifungal medicine. For some cases of chronic sinusitis, surgery is needed. Generally, these are cases in which sinusitis recurs more than 3 times per year, despite other treatments. HOME CARE INSTRUCTIONS  Drink plenty of water. Water helps thin the mucus so your sinuses can drain more easily. Use a humidifier. Inhale steam 3 to 4 times a day (for example, sit in the bathroom with the shower running). Apply a warm, moist washcloth to your face 3 to 4 times a day, or as directed by your health care provider. Use saline nasal sprays to help moisten and clean your sinuses. Take medicines only as directed by your health care provider. If you were prescribed either an antibiotic or antifungal medicine, finish it all even if you start to feel better. SEEK IMMEDIATE MEDICAL CARE IF: You have increasing pain or severe headaches. You have nausea, vomiting, or drowsiness. You have swelling around your face. You have vision problems. You have a stiff neck. You have difficulty breathing. MAKE SURE YOU:  Understand these instructions. Will watch your condition. Will get help right away if you are not doing well or get worse. Document Released: 07/07/2005 Document Revised: 11/21/2013 Document Reviewed: 07/22/2011 [employer]   Patient Information 2015 [employer]. This information is not intended to replace advice given to you by your health care provider. Make sure you discuss any questions you have with your health care provider.   

## 2014-08-18 NOTE — Progress Notes (Signed)
   Subjective:    Patient ID: Damon [name], male    DOB: [birth year], 69 y.o.   MRN: [id number]  HPI 69 y.o. male with history of HTN, Afib, PAD, CAD, OSA, preDM presents with sinus symptoms. Patient has not had Afib for 2 year but states has had afib with this cold, saw cardiologist Wednesday, started back on xarelto and cardizem 120mg  and will follow up next week.  Symptoms include bilateral ear pressure/pain, congestion, facial pain, no  fever, post nasal drip and sore throat. Onset of symptoms was 10 days ago, and has been gradually worsening since that time. Treatment to date: none.  Review of Systems  Constitutional: Negative for fever, chills and diaphoresis.  HENT: Positive for congestion, sinus pressure, sneezing and sore throat. Negative for ear pain, trouble swallowing and voice change.   Eyes: Negative.   Respiratory: Positive for cough. Negative for chest tightness, shortness of breath and wheezing.   Cardiovascular: Positive for palpitations. Negative for chest pain and leg swelling.  Gastrointestinal: Negative.   Genitourinary: Negative.   Musculoskeletal: Negative for neck pain.  Neurological: Negative.  Negative for headaches.      Objective:   Physical Exam  Constitutional: He is oriented to person, place, and time. He appears well-developed and well-nourished.  HENT:  Head: Normocephalic and atraumatic.  Right Ear: Hearing and tympanic membrane normal.  Left Ear: Hearing and tympanic membrane normal.  Nose: Right sinus exhibits maxillary sinus tenderness. Left sinus exhibits maxillary sinus tenderness.  Mouth/Throat: Uvula is midline and mucous membranes are normal. Posterior oropharyngeal erythema present. No oropharyngeal exudate or posterior oropharyngeal edema.  Eyes: Conjunctivae are normal. Pupils are equal, round, and reactive to light.  Neck: Normal range of motion. Neck supple.  Cardiovascular: Normal rate.  An irregularly irregular rhythm present.   Pulmonary/Chest: Effort normal and breath sounds normal.  Abdominal: Soft. Bowel sounds are normal.  Musculoskeletal: Normal range of motion.  Lymphadenopathy:    He has no cervical adenopathy.  Neurological: He is alert and oriented to person, place, and time.  Skin: Skin is warm and dry. No rash noted.       Assessment & Plan:  1. Acute maxillary sinusitis, recurrence not specified [J01.00] Will give dymista samples, given information about sinus infection, follow up cardio.  - azithromycin (ZITHROMAX) 250 MG tablet; 2 tablets by mouth today then one tablet daily for 4 days.  Dispense: 6 tablet; Refill: 1

## 2014-08-23 ENCOUNTER — Ambulatory Visit (INDEPENDENT_AMBULATORY_CARE_PROVIDER_SITE_OTHER): Payer: Medicare Other | Admitting: Internal Medicine

## 2014-08-23 ENCOUNTER — Encounter: Payer: Self-pay | Admitting: Internal Medicine

## 2014-08-23 ENCOUNTER — Encounter: Payer: Self-pay | Admitting: *Deleted

## 2014-08-23 VITALS — BP 122/78 | HR 91 | Ht 69.0 in | Wt 213.0 lb

## 2014-08-23 DIAGNOSIS — I481 Persistent atrial fibrillation: Secondary | ICD-10-CM

## 2014-08-23 DIAGNOSIS — I2510 Atherosclerotic heart disease of native coronary artery without angina pectoris: Secondary | ICD-10-CM

## 2014-08-23 DIAGNOSIS — I10 Essential (primary) hypertension: Secondary | ICD-10-CM

## 2014-08-23 DIAGNOSIS — I4819 Other persistent atrial fibrillation: Secondary | ICD-10-CM

## 2014-08-23 LAB — CBC WITH DIFFERENTIAL/PLATELET
Basophils Absolute: 0.1 10*3/uL (ref 0.0–0.1)
Basophils Relative: 0.6 % (ref 0.0–3.0)
Eosinophils Absolute: 0.3 10*3/uL (ref 0.0–0.7)
Eosinophils Relative: 3.2 % (ref 0.0–5.0)
HCT: 44.9 % (ref 39.0–52.0)
HEMOGLOBIN: 15.4 g/dL (ref 13.0–17.0)
LYMPHS PCT: 24.6 % (ref 12.0–46.0)
Lymphs Abs: 2.1 10*3/uL (ref 0.7–4.0)
MCHC: 34.2 g/dL (ref 30.0–36.0)
MCV: 88 fl (ref 78.0–100.0)
MONO ABS: 0.7 10*3/uL (ref 0.1–1.0)
Monocytes Relative: 8.4 % (ref 3.0–12.0)
Neutro Abs: 5.3 10*3/uL (ref 1.4–7.7)
Neutrophils Relative %: 63.2 % (ref 43.0–77.0)
Platelets: 282 10*3/uL (ref 150.0–400.0)
RBC: 5.1 Mil/uL (ref 4.22–5.81)
RDW: 13 % (ref 11.5–15.5)
WBC: 8.3 10*3/uL (ref 4.0–10.5)

## 2014-08-23 LAB — BASIC METABOLIC PANEL
BUN: 13 mg/dL (ref 6–23)
CALCIUM: 9.3 mg/dL (ref 8.4–10.5)
CHLORIDE: 105 meq/L (ref 96–112)
CO2: 30 meq/L (ref 19–32)
Creatinine, Ser: 0.92 mg/dL (ref 0.40–1.50)
GFR: 86.62 mL/min (ref >60.00)
Glucose, Bld: 87 mg/dL (ref 70–99)
Potassium: 3.8 mEq/L (ref 3.5–5.1)
SODIUM: 139 meq/L (ref 135–145)

## 2014-08-23 NOTE — Progress Notes (Signed)
PCP: [name], MD [name] is a 69 y.o. male who presents today for routine electrophysiology followup.  He underwent PCI of the LCx with a Resolute Integrity DES 11/2013.  He did well thereafter.  Most recently, he developed URI two weeks ago.  With this, he has returned to afib (after being in sinus rhythm for about a year.  He was seen by Roderic Palau NP and started on both xarelto and diltiazem.  He remains in afib today.  He reports fatigue and decreased exercise tolerance.   He has occasional palpitations.  Today, he denies symptoms of chest pain, shortness of breath,  lower extremity edema, dizziness, presyncope, or syncope.  He reports frequent calf pain with moderate activity.  The patient is otherwise without complaint today.   Past Medical History  Diagnosis Date  . Hyperlipidemia   . Mitral regurgitation   . Bladder cancer   . OSA on CPAP   . Hearing loss of both ears   . History of colon polyps     BENIGN  . PAD (peripheral artery disease)     ABI--  RIGHT SIDE NORMAL LEFT SIDE MODERATELY REDUCED W/ DISTAL LEFT SFA STENOSIS//  MILD CALDICATION  . S/P ablation of atrial fibrillation     07-27-2012  . Anticoagulant long-term use     XARELTO  . Hypertension   . PAF (paroxysmal atrial fibrillation) dx  2007 /   PREVIOUS CARDIOVERTED X3  LAST ONE 07-27-2012  POST  ABLATION  07-27-2012    PRIMARY CARDIOLOGIST-  DR [name] CARDIO--  DR [name]  . Pre-diabetes   . Vitamin D deficiency    Past Surgical History  Procedure Laterality Date  . Total hip arthroplasty  07/25/2011    Procedure: TOTAL HIP ARTHROPLASTY ANTERIOR APPROACH;  Surgeon: Mcarthur Rossetti;  Location: [facility];  Service: Orthopedics;  Laterality: Right;  . Tee without cardioversion  07/26/2012    Procedure: TRANSESOPHAGEAL ECHOCARDIOGRAM (TEE);  Surgeon: [name], MD;  Location: [facility] ENDOSCOPY;  Service: Cardiovascular;  Laterality: N/A;  .  Cardioversion  08/06/2012    Procedure: CARDIOVERSION;  Surgeon: [name], MD;  Location: [place];  Service: Cardiovascular;  Laterality: N/A;  . Total hip arthroplasty Left 02-08-2010  . Cataract extraction w/ intraocular lens  implant, bilateral    . Cardiac electrophysiology study and ablation  07-27-2012  DR Kiya Eno    SUCCESSFUL ABLATION OF A-FIB  . Tonsillectomy  age 1  . Cystoscopy w/ retrogrades Bilateral 04/12/2013    Procedure: CYSTOSCOPY WITH RETROGRADE PYELOGRAM;  Surgeon: [name], MD;  Location: [facility];  Service: Urology;  Laterality: Bilateral;  . Transurethral resection of bladder tumor with gyrus (turbt-gyrus) N/A 04/12/2013    Procedure: TRANSURETHRAL RESECTION OF BLADDER TUMOR WITH GYRUS (TURBT-GYRUS);  Surgeon: [name], MD;  Location: [facility];  Service: Urology;  Laterality: N/A;  . Atrial fibrillation ablation N/A 07/27/2012    Procedure: ATRIAL FIBRILLATION ABLATION;  Surgeon: [name], MD;  [facility] CATH LAB;  Service: Cardiovascular;  Laterality: N/A;  . Left heart catheterization with coronary angiogram N/A 11/24/2013    Procedure: LEFT HEART CATHETERIZATION WITH CORONARY ANGIOGRAM;  Surgeon: [name], MD;  Location: [facility] CATH LAB;  Service: Cardiovascular;  Laterality: N/A;  . Percutaneous coronary stent intervention (pci-s)  11/24/2013    Procedure: PERCUTANEOUS CORONARY STENT INTERVENTION (PCI-S);  Surgeon: [name], MD;  Location: [facility] CATH LAB;  Service: Cardiovascular;;  . Percutaneous coronary rotoblator intervention (pci-r) N/A 11/28/2013    Procedure: PERCUTANEOUS CORONARY ROTOBLATOR INTERVENTION (PCI-R);  Surgeon: [name], MD;  Location: [facility] CATH LAB;  Service: Cardiovascular;  Laterality: N/A;    Current Outpatient Prescriptions  Medication Sig Dispense Refill  . Ascorbic Acid (VITAMIN C) 1000 MG tablet Take 1,000 mg by mouth as directed.     .  bisoprolol-hydrochlorothiazide (ZIAC) 10-6.25 MG per tablet Take 1 tablet by mouth daily. 90 tablet 3  . cholecalciferol (VITAMIN D) 1000 UNITS tablet Take 5,000 Units by mouth daily.     . clopidogrel (PLAVIX) 75 MG tablet Take 75 mg by mouth daily with breakfast.    . diltiazem (CARDIZEM CD) 120 MG 24 hr capsule Take 1 capsule (120 mg total) by mouth daily. 30 capsule 11  . Multiple Vitamin (MULITIVITAMIN WITH MINERALS) TABS Take 1 tablet by mouth daily. FOR MEN    . nitroGLYCERIN (NITROSTAT) 0.4 MG SL tablet Place 1 tablet (0.4 mg total) under the tongue every 5 (five) minutes as needed for chest pain. 25 tablet 2  . rivaroxaban (XARELTO) 20 MG TABS tablet Take 1 tablet (20 mg total) by mouth daily with supper. 30 tablet 11  . rosuvastatin (CRESTOR) 40 MG tablet Take 1 tablet (40 mg total) by mouth daily. 30 tablet 0   No current facility-administered medications for this visit.    Physical Exam: Filed Vitals:   08/23/14 1415  BP: 122/78  Pulse: 91  Height: 5\' 9"  (1.753 m)  Weight: 213 lb (96.616 kg)    GEN- The patient is well appearing, alert and oriented x 3 today.   Head- normocephalic, atraumatic Eyes-  Sclera clear, conjunctiva pink Ears- hearing intact Oropharynx- clear Lungs- Clear to ausculation bilaterally, normal work of breathing Heart- Regular rate and rhythm, no murmurs, rubs or gallops, PMI not laterally displaced GI- soft, NT, ND, + BS Extremities- no clubbing, cyanosis, or edema  ekg today reveals afib, V rate 91 bpm  Cath reports and Roderic Palau NPs notes are reviewed  Assessment and Plan:  1. afib Doing well s/p ablation without recurrence s/p ablation off of AAD therapy until his recent URI At this time, I would favor cardioversion.  He has been on xarelto for 1 week now.  He is at risks for worsening afib, clinical decompensation, CHF, and hospitalization.  I would live to proceed with cardioversion at the next available time.  Risks, benefits, and  alternatives to TEE guided cardioversion were discussed at length with the patient who wishes to proceed.  Continue diltiazem for rate control in the interim.   2.  HTN Stable No change required today  3. CAD Stable No change required today Hopefully he can stop plavix in May I will arrange follow-up with Dr Angelena Form to follow-up on his CAD 5/16  Follow-up with Roderic Palau NP in the AF clinic in 4 weeks

## 2014-08-23 NOTE — Patient Instructions (Addendum)
Your physician recommends that you schedule a follow-up appointment in: 4 weeks with Roderic Palau, NP and in May with Dr Julianne Handler     Your physician has requested that you have a TEE/Cardioversion. During a TEE, sound waves are used to create images of your heart. It provides your doctor with information about the size and shape of your heart and how well your heart's chambers and valves are working. In this test, a transducer is attached to the end of a flexible tube that is guided down you throat and into your esophagus (the tube leading from your mouth to your stomach) to get a more detailed image of your heart. Once the TEE has determined that a blood clot is not present, the cardioversion begins. Electrical Cardioversion uses a jolt of electricity to your heart either through paddles or wired patches attached to your chest. This is a controlled, usually prescheduled, procedure. This procedure is done at the hospital and you are not awake during the procedure. You usually go home the day of the procedure. Please see the instruction sheet given to you today for more information.  See instruction sheet for procedure

## 2014-08-24 ENCOUNTER — Encounter (HOSPITAL_COMMUNITY): Payer: Self-pay | Admitting: Pharmacy Technician

## 2014-08-25 ENCOUNTER — Encounter (HOSPITAL_COMMUNITY): Payer: Self-pay | Admitting: *Deleted

## 2014-08-25 ENCOUNTER — Ambulatory Visit (HOSPITAL_COMMUNITY)
Admission: RE | Admit: 2014-08-25 | Discharge: 2014-08-25 | Disposition: A | Discharge status: Home / Self Care | Payer: Medicare Other | Source: Ambulatory Visit | Attending: Cardiology | Admitting: Cardiology

## 2014-08-25 ENCOUNTER — Ambulatory Visit (HOSPITAL_COMMUNITY): Payer: Medicare Other | Admitting: Anesthesiology

## 2014-08-25 ENCOUNTER — Encounter (HOSPITAL_COMMUNITY)
Admission: RE | Disposition: A | Discharge status: Home / Self Care | Payer: Self-pay | Source: Ambulatory Visit | Attending: Cardiology

## 2014-08-25 DIAGNOSIS — I10 Essential (primary) hypertension: Secondary | ICD-10-CM | POA: Insufficient documentation

## 2014-08-25 DIAGNOSIS — Z7901 Long term (current) use of anticoagulants: Secondary | ICD-10-CM | POA: Insufficient documentation

## 2014-08-25 DIAGNOSIS — I739 Peripheral vascular disease, unspecified: Secondary | ICD-10-CM | POA: Insufficient documentation

## 2014-08-25 DIAGNOSIS — Z955 Presence of coronary angioplasty implant and graft: Secondary | ICD-10-CM | POA: Diagnosis not present

## 2014-08-25 DIAGNOSIS — G4733 Obstructive sleep apnea (adult) (pediatric): Secondary | ICD-10-CM | POA: Diagnosis not present

## 2014-08-25 DIAGNOSIS — I2510 Atherosclerotic heart disease of native coronary artery without angina pectoris: Secondary | ICD-10-CM | POA: Insufficient documentation

## 2014-08-25 DIAGNOSIS — I4891 Unspecified atrial fibrillation: Secondary | ICD-10-CM | POA: Insufficient documentation

## 2014-08-25 DIAGNOSIS — Z8551 Personal history of malignant neoplasm of bladder: Secondary | ICD-10-CM | POA: Diagnosis not present

## 2014-08-25 DIAGNOSIS — E785 Hyperlipidemia, unspecified: Secondary | ICD-10-CM | POA: Diagnosis not present

## 2014-08-25 DIAGNOSIS — E559 Vitamin D deficiency, unspecified: Secondary | ICD-10-CM | POA: Diagnosis not present

## 2014-08-25 DIAGNOSIS — Z87891 Personal history of nicotine dependence: Secondary | ICD-10-CM | POA: Insufficient documentation

## 2014-08-25 HISTORY — PX: CARDIOVERSION: SHX1299

## 2014-08-25 HISTORY — PX: TEE WITHOUT CARDIOVERSION: SHX5443

## 2014-08-25 SURGERY — ECHOCARDIOGRAM, TRANSESOPHAGEAL
Anesthesia: Monitor Anesthesia Care

## 2014-08-25 MED ORDER — LIDOCAINE HCL (CARDIAC) 20 MG/ML IV SOLN
INTRAVENOUS | Status: DC | PRN
  Administered 2014-08-25: 50 mg via INTRAVENOUS

## 2014-08-25 MED ORDER — LACTATED RINGERS IV SOLN
INTRAVENOUS | Status: DC
  Administered 2014-08-25: 1000 mL via INTRAVENOUS

## 2014-08-25 MED ORDER — PROPOFOL 10 MG/ML IV BOLUS
INTRAVENOUS | Status: DC | PRN
  Administered 2014-08-25 (×4): 20 mg via INTRAVENOUS
  Administered 2014-08-25: 30 mg via INTRAVENOUS
  Administered 2014-08-25: 10 mg via INTRAVENOUS
  Administered 2014-08-25: 30 mg via INTRAVENOUS
  Administered 2014-08-25: 10 mg via INTRAVENOUS
  Administered 2014-08-25 (×2): 20 mg via INTRAVENOUS

## 2014-08-25 NOTE — Progress Notes (Signed)
  Echocardiogram Echocardiogram Transesophageal has been performed.  Damon [name] [name] 08/25/2014, 1:33 PM

## 2014-08-25 NOTE — Discharge Instructions (Signed)
Transesophageal Echocardiogram Transesophageal echocardiography (TEE) is a picture test of your heart using sound waves. The pictures taken can give very detailed pictures of your heart. This can help your doctor see if there are problems with your heart. TEE can check:  If your heart has blood clots in it.  How well your heart valves are working.  If you have an infection on the inside of your heart.  Some of the major arteries of your heart.  If your heart valve is working after a Office manager.  Your heart before a procedure that uses a shock to your heart to get the rhythm back to normal. BEFORE THE PROCEDURE  Do not eat or drink for 6 hours before the procedure or as told by your doctor.  Electrical Cardioversion, Care After Refer to this sheet in the next few weeks. These instructions provide you with information on caring for yourself after your procedure. Your health care provider may also give you more specific instructions. Your treatment has been planned according to current medical practices, but problems sometimes occur. Call your health care provider if you have any problems or questions after your procedure. WHAT TO EXPECT AFTER THE PROCEDURE After your procedure, it is typical to have the following sensations:  Some redness on the skin where the shocks were delivered. If this is tender, a sunburn lotion or hydrocortisone cream may help.  Possible return of an abnormal heart rhythm within hours or days after the procedure. HOME CARE INSTRUCTIONS  Take medicines only as directed by your health care provider. Be sure you understand how and when to take your medicine.  Learn how to feel your pulse and check it often.  Limit your activity for 48 hours after the procedure or as directed by your health care provider.  Avoid or minimize caffeine and other stimulants as directed by your health care provider. SEEK MEDICAL CARE IF:  You feel like your heart is beating too fast  or your pulse is not regular.  You have any questions about your medicines.  You have bleeding that will not stop. SEEK IMMEDIATE MEDICAL CARE IF:  You are dizzy or feel faint.  It is hard to breathe or you feel short of breath.  There is a change in discomfort in your chest.  Your speech is slurred or you have trouble moving an arm or leg on one side of your body.  You get a serious muscle cramp that does not go away.  Your fingers or toes turn cold or blue. Document Released: 04/27/2013 Document Revised: 11/21/2013 Document Reviewed: 04/27/2013 [facility] Patient Information 2015 [place], [place]. This information is not intended to replace advice given to you by your health care provider. Make sure you discuss any questions you have with your health care provider.   Make plans to have someone drive you home after the procedure. Do not drive yourself home.  An IV tube will be put in your arm. PROCEDURE  You will be given a medicine to help you relax (sedative). It will be given through the IV tube.  A numbing medicine will be sprayed or gargled in the back of your throat to help numb it.  The tip of the probe is placed into the back of your mouth. You will be asked to swallow. This helps to pass the probe into your esophagus.  Once the tip of the probe is in the right place, your doctor can take pictures of your heart.  You may feel  pressure at the back of your throat. AFTER THE PROCEDURE  You will be taken to a recovery area so the sedative can wear off.  Your throat may be sore and scratchy. This will go away slowly over time.  You will go home when you are fully awake and able to swallow liquids.  You should have someone stay with you for the next 24 hours.  Do not drive or operate machinery for the next 24 hours. Document Released: 05/04/2009 Document Revised: 07/12/2013 Document Reviewed: 01/06/2013 [facility] Patient Information 2015 [place], [place]. This  information is not intended to replace advice given to you by your health care provider. Make sure you discuss any questions you have with your health care provider.   Conscious Sedation, Adult, Care After Refer to this sheet in the next few weeks. These instructions provide you with information on caring for yourself after your procedure. Your health care provider may also give you more specific instructions. Your treatment has been planned according to current medical practices, but problems sometimes occur. Call your health care provider if you have any problems or questions after your procedure. WHAT TO EXPECT AFTER THE PROCEDURE  After your procedure:  You may feel sleepy, clumsy, and have poor balance for several hours.  Vomiting may occur if you eat too soon after the procedure. HOME CARE INSTRUCTIONS  Do not participate in any activities where you could become injured for at least 24 hours. Do not:  Drive.  Swim.  Ride a bicycle.  Operate heavy machinery.  Cook.  Use power tools.  Climb ladders.  Work from a high place.  Do not make important decisions or sign legal documents until you are improved.  If you vomit, drink water, juice, or soup when you can drink without vomiting. Make sure you have little or no nausea before eating solid foods.  Only take over-the-counter or prescription medicines for pain, discomfort, or fever as directed by your health care provider.  Make sure you and your family fully understand everything about the medicines given to you, including what side effects may occur.  You should not drink alcohol, take sleeping pills, or take medicines that cause drowsiness for at least 24 hours.  If you smoke, do not smoke without supervision.  If you are feeling better, you may resume normal activities 24 hours after you were sedated.  Keep all appointments with your health care provider. SEEK MEDICAL CARE IF:  Your skin is pale or bluish in  color.  You continue to feel nauseous or vomit.  Your pain is getting worse and is not helped by medicine.  You have bleeding or swelling.  You are still sleepy or feeling clumsy after 24 hours. SEEK IMMEDIATE MEDICAL CARE IF:  You develop a rash.  You have difficulty breathing.  You develop any type of allergic problem.  You have a fever. MAKE SURE YOU:  Understand these instructions.  Will watch your condition.  Will get help right away if you are not doing well or get worse. Document Released: 04/27/2013 Document Reviewed: 04/27/2013 [facility] Patient Information 2015 [place], [place]. This information is not intended to replace advice given to you by your health care provider. Make sure you discuss any questions you have with your health care provider.

## 2014-08-25 NOTE — Transfer of Care (Signed)
Immediate Anesthesia Transfer of Care Note  Patient: [name]  Procedure(s) Performed: Procedure(s): TRANSESOPHAGEAL ECHOCARDIOGRAM (TEE) (N/A) CARDIOVERSION (N/A)  Patient Location: Endoscopy Unit  Anesthesia Type:MAC  Level of Consciousness: awake, oriented and patient cooperative  Airway & Oxygen Therapy: Patient Spontanous Breathing and Patient connected to nasal cannula oxygen  Post-op Assessment: Report given to RN and Post -op Vital signs reviewed and stable  Post vital signs: Reviewed  Last Vitals:  Filed Vitals:   08/25/14 1337  BP: 93/62  Temp: 36.6 C  Resp: 11    Complications: No apparent anesthesia complications

## 2014-08-25 NOTE — H&P (View-Only) (Signed)
PCP: [name], MD [name] is a 69 y.o. male who presents today for routine electrophysiology followup.  He underwent PCI of the LCx with a Resolute Integrity DES 11/2013.  He did well thereafter.  Most recently, he developed URI two weeks ago.  With this, he has returned to afib (after being in sinus rhythm for about a year.  He was seen by Roderic Palau NP and started on both xarelto and diltiazem.  He remains in afib today.  He reports fatigue and decreased exercise tolerance.   He has occasional palpitations.  Today, he denies symptoms of chest pain, shortness of breath,  lower extremity edema, dizziness, presyncope, or syncope.  He reports frequent calf pain with moderate activity.  The patient is otherwise without complaint today.   Past Medical History  Diagnosis Date  . Hyperlipidemia   . Mitral regurgitation   . Bladder cancer   . OSA on CPAP   . Hearing loss of both ears   . History of colon polyps     BENIGN  . PAD (peripheral artery disease)     ABI--  RIGHT SIDE NORMAL LEFT SIDE MODERATELY REDUCED W/ DISTAL LEFT SFA STENOSIS//  MILD CALDICATION  . S/P ablation of atrial fibrillation     07-27-2012  . Anticoagulant long-term use     XARELTO  . Hypertension   . PAF (paroxysmal atrial fibrillation) dx  2007 /   PREVIOUS CARDIOVERTED X3  LAST ONE 07-27-2012  POST  ABLATION  07-27-2012    PRIMARY CARDIOLOGIST-  DR [name] CARDIO--  DR [name]  . Pre-diabetes   . Vitamin D deficiency    Past Surgical History  Procedure Laterality Date  . Total hip arthroplasty  07/25/2011    Procedure: TOTAL HIP ARTHROPLASTY ANTERIOR APPROACH;  Surgeon: Mcarthur Rossetti;  Location: [facility];  Service: Orthopedics;  Laterality: Right;  . Tee without cardioversion  07/26/2012    Procedure: TRANSESOPHAGEAL ECHOCARDIOGRAM (TEE);  Surgeon: [name], MD;  Location: [facility] ENDOSCOPY;  Service: Cardiovascular;  Laterality: N/A;  .  Cardioversion  08/06/2012    Procedure: CARDIOVERSION;  Surgeon: [name], MD;  Location: [place];  Service: Cardiovascular;  Laterality: N/A;  . Total hip arthroplasty Left 02-08-2010  . Cataract extraction w/ intraocular lens  implant, bilateral    . Cardiac electrophysiology study and ablation  07-27-2012  DR Reann Dobias    SUCCESSFUL ABLATION OF A-FIB  . Tonsillectomy  age 53  . Cystoscopy w/ retrogrades Bilateral 04/12/2013    Procedure: CYSTOSCOPY WITH RETROGRADE PYELOGRAM;  Surgeon: [name], MD;  Location: [facility];  Service: Urology;  Laterality: Bilateral;  . Transurethral resection of bladder tumor with gyrus (turbt-gyrus) N/A 04/12/2013    Procedure: TRANSURETHRAL RESECTION OF BLADDER TUMOR WITH GYRUS (TURBT-GYRUS);  Surgeon: [name], MD;  Location: [facility];  Service: Urology;  Laterality: N/A;  . Atrial fibrillation ablation N/A 07/27/2012    Procedure: ATRIAL FIBRILLATION ABLATION;  Surgeon: [name], MD;  Location: [facility] CATH LAB;  Service: Cardiovascular;  Laterality: N/A;  . Left heart catheterization with coronary angiogram N/A 11/24/2013    Procedure: LEFT HEART CATHETERIZATION WITH CORONARY ANGIOGRAM;  Surgeon: [name], MD;  Location: [facility] CATH LAB;  Service: Cardiovascular;  Laterality: N/A;  . Percutaneous coronary stent intervention (pci-s)  11/24/2013    Procedure: PERCUTANEOUS CORONARY STENT INTERVENTION (PCI-S);  Surgeon: [name], MD;  Location: [facility] CATH LAB;  Service: Cardiovascular;;  . Percutaneous coronary rotoblator intervention (pci-r) N/A 11/28/2013    Procedure: PERCUTANEOUS CORONARY ROTOBLATOR INTERVENTION (PCI-R);  Surgeon: [name], MD;  Location: [facility] CATH LAB;  Service: Cardiovascular;  Laterality: N/A;    Current Outpatient Prescriptions  Medication Sig Dispense Refill  . Ascorbic Acid (VITAMIN C) 1000 MG tablet Take 1,000 mg by mouth as directed.     .  bisoprolol-hydrochlorothiazide (ZIAC) 10-6.25 MG per tablet Take 1 tablet by mouth daily. 90 tablet 3  . cholecalciferol (VITAMIN D) 1000 UNITS tablet Take 5,000 Units by mouth daily.     . clopidogrel (PLAVIX) 75 MG tablet Take 75 mg by mouth daily with breakfast.    . diltiazem (CARDIZEM CD) 120 MG 24 hr capsule Take 1 capsule (120 mg total) by mouth daily. 30 capsule 11  . Multiple Vitamin (MULITIVITAMIN WITH MINERALS) TABS Take 1 tablet by mouth daily. FOR MEN    . nitroGLYCERIN (NITROSTAT) 0.4 MG SL tablet Place 1 tablet (0.4 mg total) under the tongue every 5 (five) minutes as needed for chest pain. 25 tablet 2  . rivaroxaban (XARELTO) 20 MG TABS tablet Take 1 tablet (20 mg total) by mouth daily with supper. 30 tablet 11  . rosuvastatin (CRESTOR) 40 MG tablet Take 1 tablet (40 mg total) by mouth daily. 30 tablet 0   No current facility-administered medications for this visit.    Physical Exam: Filed Vitals:   08/23/14 1415  BP: 122/78  Pulse: 91  Height: 5\' 9"  (1.753 m)  Weight: 213 lb (96.616 kg)    GEN- The patient is well appearing, alert and oriented x 3 today.   Head- normocephalic, atraumatic Eyes-  Sclera clear, conjunctiva pink Ears- hearing intact Oropharynx- clear Lungs- Clear to ausculation bilaterally, normal work of breathing Heart- Regular rate and rhythm, no murmurs, rubs or gallops, PMI not laterally displaced GI- soft, NT, ND, + BS Extremities- no clubbing, cyanosis, or edema  ekg today reveals afib, V rate 91 bpm  Cath reports and Roderic Palau NPs notes are reviewed  Assessment and Plan:  1. afib Doing well s/p ablation without recurrence s/p ablation off of AAD therapy until his recent URI At this time, I would favor cardioversion.  He has been on xarelto for 1 week now.  He is at risks for worsening afib, clinical decompensation, CHF, and hospitalization.  I would live to proceed with cardioversion at the next available time.  Risks, benefits, and  alternatives to TEE guided cardioversion were discussed at length with the patient who wishes to proceed.  Continue diltiazem for rate control in the interim.   2.  HTN Stable No change required today  3. CAD Stable No change required today Hopefully he can stop plavix in May I will arrange follow-up with Dr Angelena Form to follow-up on his CAD 5/16  Follow-up with Roderic Palau NP in the AF clinic in 4 weeks

## 2014-08-25 NOTE — Anesthesia Preprocedure Evaluation (Addendum)
Anesthesia Evaluation  Patient identified by MRN, date of birth, ID band Patient awake    Reviewed: Allergy & Precautions, NPO status , Patient's Chart, lab work & pertinent test results, reviewed documented beta blocker date and time   Airway Mallampati: II   Neck ROM: Full    Dental  (+) Dental Advisory Given, Teeth Intact   Pulmonary sleep apnea , former smoker,  breath sounds clear to auscultation        Cardiovascular hypertension, Pt. on medications + angina + CAD and + Cardiac Stents Rhythm:Irregular  STENT 11/2013   Neuro/Psych    GI/Hepatic negative GI ROS, Neg liver ROS,   Endo/Other    Renal/GU negative Renal ROS     Musculoskeletal   Abdominal (+)  Abdomen: soft.    Peds  Hematology   Anesthesia Other Findings   Reproductive/Obstetrics                           Anesthesia Physical Anesthesia Plan  ASA: III  Anesthesia Plan: MAC   Post-op Pain Management:    Induction: Intravenous  Airway Management Planned: Nasal Cannula  Additional Equipment:   Intra-op Plan:   Post-operative Plan:   Informed Consent:   Plan Discussed with:   Anesthesia Plan Comments:         Anesthesia Quick Evaluation

## 2014-08-25 NOTE — Interval H&P Note (Signed)
History and Physical Interval Note:  08/25/2014 1:08 PM  Damon [name]  has presented today for surgery, with the diagnosis of afib  The various methods of treatment have been discussed with the patient and family. After consideration of risks, benefits and other options for treatment, the patient has consented to  Procedure(s): TRANSESOPHAGEAL ECHOCARDIOGRAM (TEE) (N/A) CARDIOVERSION (N/A) as a surgical intervention .  The patient's history has been reviewed, patient examined, no change in status, stable for surgery.  I have reviewed the patient's chart and labs.  Questions were answered to the patient's satisfaction.     Amberlea Spagnuolo [employer]

## 2014-08-25 NOTE — CV Procedure (Signed)
Procedure: TEE  Indication: Pre-DCCV  Sedation: Propofol per anesthesiology  Findings:  See echo section for full report.  Normal LV size and systolic function, EF 41-66%. Normal RV size and systolic function.  Trivial MR.  Trileaflet aortic valve with no AI or AS.  Mild LAE with no LAA thrombus.  Negative bubble study, no ASD or PFO.  Normal caliber aorta with grade III plaque in the descending thoracic aorta.   May proceed to DCCV.   Loralie Champagne 08/25/2014 1:24 PM

## 2014-08-25 NOTE — Procedures (Addendum)
Electrical Cardioversion Procedure Note Damon [name] [id number] 25-Sep-1945  Procedure: Electrical Cardioversion Indications:  Atrial Fibrillation.  Patient has been on Xarelto without missing doses.  TEE showed no LAA thrombus.   Procedure Details Consent: Risks of procedure as well as the alternatives and risks of each were explained to the (patient/caregiver).  Consent for procedure obtained. Time Out: Verified patient identification, verified procedure, site/side was marked, verified correct patient position, special equipment/implants available, medications/allergies/relevent history reviewed, required imaging and test results available.  Performed  Patient placed on cardiac monitor, pulse oximetry, supplemental oxygen as necessary.  Sedation given: Propofol Pacer pads placed anterior and posterior chest.  Cardioverted 1 time(s).  Cardioverted at [place].  Evaluation Findings: Post procedure EKG shows: NSR Complications: None Patient did tolerate procedure well.   Loralie Champagne 08/25/2014, 1:25 PM

## 2014-08-25 NOTE — Anesthesia Postprocedure Evaluation (Signed)
  Anesthesia Post-op Note  Patient: Damon [name]  Procedure(s) Performed: Procedure(s): TRANSESOPHAGEAL ECHOCARDIOGRAM (TEE) (N/A) CARDIOVERSION (N/A)  Patient Location: PACU  Anesthesia Type:General  Level of Consciousness: awake and alert   Airway and Oxygen Therapy: Patient Spontanous Breathing  Post-op Pain: mild  Post-op Assessment: Post-op Vital signs reviewed, Patient's Cardiovascular Status Stable, Respiratory Function Stable, Patent Airway and No signs of Nausea or vomiting  Post-op Vital Signs: Reviewed and stable  Last Vitals:  Filed Vitals:   08/25/14 1337  BP: 93/62  Temp: 36.6 C  Resp: 11    Complications: No apparent anesthesia complications

## 2014-08-28 ENCOUNTER — Encounter (HOSPITAL_COMMUNITY): Payer: Self-pay | Admitting: Cardiology

## 2014-09-14 ENCOUNTER — Ambulatory Visit: Payer: Medicare Other | Admitting: Internal Medicine

## 2014-09-29 ENCOUNTER — Inpatient Hospital Stay (HOSPITAL_COMMUNITY): Admission: RE | Admit: 2014-09-29 | Payer: Medicare Other | Source: Ambulatory Visit | Admitting: Nurse Practitioner

## 2014-10-03 ENCOUNTER — Encounter: Payer: Self-pay | Admitting: Nurse Practitioner

## 2014-10-03 ENCOUNTER — Ambulatory Visit (INDEPENDENT_AMBULATORY_CARE_PROVIDER_SITE_OTHER): Payer: Medicare Other | Admitting: Nurse Practitioner

## 2014-10-03 VITALS — BP 128/74 | HR 55 | Ht 69.0 in | Wt 210.4 lb

## 2014-10-03 DIAGNOSIS — I480 Paroxysmal atrial fibrillation: Secondary | ICD-10-CM

## 2014-10-03 DIAGNOSIS — I4819 Other persistent atrial fibrillation: Secondary | ICD-10-CM

## 2014-10-03 DIAGNOSIS — I481 Persistent atrial fibrillation: Secondary | ICD-10-CM

## 2014-10-03 NOTE — Progress Notes (Signed)
PCP: [name], MD Primary Cardiologist: Dabid Godown is a 69 y.o. male who presents today for routine electrophysiology followup.  He underwent PCI of the LCx with a Resolute Integrity DES 11/2013.  He did well thereafter.  Most recently, he developed URI 1/16.  With this, he has returned to afib (after being in sinus rhythm for about a year.  He was seen  and started on both xarelto and diltiazem.  He reported fatigue and decreased exercise tolerance.   He had successful DCCV 08/25/14 and has been maintaining SR. He has a chads2vasc score of 3 and should stay on xarelto but fp would like to d/c if possible.He continues on Plavix for but may be eligible to have this d/c'ed when he f/u's with cardiology in May. Currently, no bleeding issues.   Today, he denies symptoms of chest pain, shortness of breath,  lower extremity edema, dizziness, presyncope, or syncope.  He reports frequent calf pain with moderate activity.  The patient is otherwise without complaint today.   Past Medical History  Diagnosis Date  . Hyperlipidemia   . Mitral regurgitation   . Bladder cancer   . OSA on CPAP   . Hearing loss of both ears   . History of colon polyps     BENIGN  . PAD (peripheral artery disease)     ABI--  RIGHT SIDE NORMAL LEFT SIDE MODERATELY REDUCED W/ DISTAL LEFT SFA STENOSIS//  MILD CALDICATION  . S/P ablation of atrial fibrillation     07-27-2012  . Anticoagulant long-term use     XARELTO  . Hypertension   . PAF (paroxysmal atrial fibrillation) dx  2007 /   PREVIOUS CARDIOVERTED X3  LAST ONE 07-27-2012  POST  ABLATION  07-27-2012    PRIMARY CARDIOLOGIST-  DR [name] CARDIO--  DR [name]  . Pre-diabetes   . Vitamin D deficiency    Past Surgical History  Procedure Laterality Date  . Total hip arthroplasty  07/25/2011    Procedure: TOTAL HIP ARTHROPLASTY ANTERIOR APPROACH;  Surgeon: Mcarthur Rossetti;  Location: [facility];  Service: Orthopedics;   Laterality: Right;  . Tee without cardioversion  07/26/2012    Procedure: TRANSESOPHAGEAL ECHOCARDIOGRAM (TEE);  Surgeon: [name], MD;  Location: [facility] ENDOSCOPY;  Service: Cardiovascular;  Laterality: N/A;  . Cardioversion  08/06/2012    Procedure: CARDIOVERSION;  Surgeon: [name], MD;  Location: [place];  Service: Cardiovascular;  Laterality: N/A;  . Total hip arthroplasty Left 02-08-2010  . Cataract extraction w/ intraocular lens  implant, bilateral    . Cardiac electrophysiology study and ablation  07-27-2012  DR ALLRED    SUCCESSFUL ABLATION OF A-FIB  . Tonsillectomy  age 39  . Cystoscopy w/ retrogrades Bilateral 04/12/2013    Procedure: CYSTOSCOPY WITH RETROGRADE PYELOGRAM;  Surgeon: [name], MD;  Location: [facility];  Service: Urology;  Laterality: Bilateral;  . Transurethral resection of bladder tumor with gyrus (turbt-gyrus) N/A 04/12/2013    Procedure: TRANSURETHRAL RESECTION OF BLADDER TUMOR WITH GYRUS (TURBT-GYRUS);  Surgeon: [name], MD;  Location: [facility];  Service: Urology;  Laterality: N/A;  . Atrial fibrillation ablation N/A 07/27/2012    Procedure: ATRIAL FIBRILLATION ABLATION;  Surgeon: [name], MD;  Location: [facility] CATH LAB;  Service: Cardiovascular;  Laterality: N/A;  . Left heart catheterization with coronary angiogram N/A 11/24/2013    Procedure: LEFT HEART CATHETERIZATION WITH CORONARY ANGIOGRAM;  Surgeon: [name],  MD;  Location: [place] CATH LAB;  Service: Cardiovascular;  Laterality: N/A;  . Percutaneous coronary stent intervention (pci-s)  11/24/2013    Procedure: PERCUTANEOUS CORONARY STENT INTERVENTION (PCI-S);  Surgeon: [name], MD;  Location: [facility] CATH LAB;  Service: Cardiovascular;;  . Percutaneous coronary rotoblator intervention (pci-r) N/A 11/28/2013    Procedure: PERCUTANEOUS CORONARY ROTOBLATOR INTERVENTION (PCI-R);  Surgeon: [name], MD;  Location: [facility] CATH LAB;   Service: Cardiovascular;  Laterality: N/A;  . Tee without cardioversion N/A 08/25/2014    Procedure: TRANSESOPHAGEAL ECHOCARDIOGRAM (TEE);  Surgeon: Larey Dresser, MD;  Location: [place];  Service: Cardiovascular;  Laterality: N/A;  . Cardioversion N/A 08/25/2014    Procedure: CARDIOVERSION;  Surgeon: Larey Dresser, MD;  Location: [place];  Service: Cardiovascular;  Laterality: N/A;    Current Outpatient Prescriptions  Medication Sig Dispense Refill  . Ascorbic Acid (VITAMIN C) 1000 MG tablet Take 1,000 mg by mouth daily as needed (for calcium).     . bisoprolol-hydrochlorothiazide (ZIAC) 10-6.25 MG per tablet Take 1 tablet by mouth daily. 90 tablet 3  . cholecalciferol (VITAMIN D) 1000 UNITS tablet Take 5,000 Units by mouth daily.     . clopidogrel (PLAVIX) 75 MG tablet Take 75 mg by mouth daily with breakfast.    . diltiazem (CARDIZEM CD) 120 MG 24 hr capsule Take 1 capsule (120 mg total) by mouth daily. 30 capsule 11  . Multiple Vitamin (MULITIVITAMIN WITH MINERALS) TABS Take 1 tablet by mouth daily. FOR MEN    . nitroGLYCERIN (NITROSTAT) 0.4 MG SL tablet Place 1 tablet (0.4 mg total) under the tongue every 5 (five) minutes as needed for chest pain. 25 tablet 2  . rivaroxaban (XARELTO) 20 MG TABS tablet Take 1 tablet (20 mg total) by mouth daily with supper. 30 tablet 11  . rosuvastatin (CRESTOR) 40 MG tablet Take 1 tablet (40 mg total) by mouth daily. 30 tablet 0   No current facility-administered medications for this visit.    Physical Exam: Filed Vitals:   10/03/14 1403  BP: 128/74  Pulse: 55  Height: 5\' 9"  (1.753 m)  Weight: 210 lb 6.4 oz (95.437 kg)    GEN- The patient is well appearing, alert and oriented x 3 today.   Head- normocephalic, atraumatic Eyes-  Sclera clear, conjunctiva pink Ears- hearing intact Oropharynx- clear Lungs- Clear to ausculation bilaterally, normal work of breathing Heart- Regular rate and rhythm, no murmurs, rubs or gallops, PMI not  laterally displaced GI- soft, NT, ND, + BS Extremities- no clubbing, cyanosis, or edema  ekg today reveals SR at 55 bpm, normal ekg.  DCCV notes reviewed.  Assessment and Plan:  1.Persitent afib Doing well s/p DCCV continuing in SR.  Chads2vasc score of 3(HTN, AGE, CAD) Advised pt to stay on NOAC but will review with Dr. [name] since pt  wishes to stop drug but willing to defer to our advise. Eligible to stop Plavix maybe in May since stent placed last May, defer to cardiologist..Has f/u pending. May stop diltiazem.   2.  HTN Stable No change required today  3. CAD Stable No change required today   Follow-up with Dr. [name] in 3 months.

## 2014-10-03 NOTE — Patient Instructions (Signed)
Your physician recommends that you schedule a follow-up appointment in 3 months with Dr Allred    

## 2014-10-04 ENCOUNTER — Telehealth: Payer: Self-pay | Admitting: *Deleted

## 2014-10-04 NOTE — Telephone Encounter (Signed)
Pt notified of Dr. Jackalyn Lombard recommendation to stay on xarelto due to risk factors of CHADsVas score of 3 (htn, age, cad). Told him to talk with his general cardiologist in May regarding continuing the plavix but xarelto should be continued. Patient verbalized understanding.

## 2014-10-17 ENCOUNTER — Ambulatory Visit (INDEPENDENT_AMBULATORY_CARE_PROVIDER_SITE_OTHER): Payer: Medicare Other | Admitting: Internal Medicine

## 2014-10-17 ENCOUNTER — Encounter: Payer: Self-pay | Admitting: Internal Medicine

## 2014-10-17 VITALS — BP 118/74 | HR 72 | Temp 97.5°F | Resp 16 | Ht 68.75 in | Wt 208.6 lb

## 2014-10-17 DIAGNOSIS — Z79899 Other long term (current) drug therapy: Secondary | ICD-10-CM

## 2014-10-17 DIAGNOSIS — R7303 Prediabetes: Secondary | ICD-10-CM

## 2014-10-17 DIAGNOSIS — J0410 Acute tracheitis without obstruction: Secondary | ICD-10-CM

## 2014-10-17 DIAGNOSIS — E782 Mixed hyperlipidemia: Secondary | ICD-10-CM

## 2014-10-17 DIAGNOSIS — I10 Essential (primary) hypertension: Secondary | ICD-10-CM

## 2014-10-17 DIAGNOSIS — E559 Vitamin D deficiency, unspecified: Secondary | ICD-10-CM

## 2014-10-17 DIAGNOSIS — R7309 Other abnormal glucose: Secondary | ICD-10-CM

## 2014-10-17 LAB — HEPATIC FUNCTION PANEL
ALT: 21 U/L (ref 0–53)
AST: 20 U/L (ref 0–37)
Albumin: 4.1 g/dL (ref 3.5–5.2)
Alkaline Phosphatase: 50 U/L (ref 39–117)
BILIRUBIN INDIRECT: 1.3 mg/dL — AB (ref 0.2–1.2)
Bilirubin, Direct: 0.3 mg/dL (ref 0.0–0.3)
Total Bilirubin: 1.6 mg/dL — ABNORMAL HIGH (ref 0.2–1.2)
Total Protein: 6.4 g/dL (ref 6.0–8.3)

## 2014-10-17 LAB — LIPID PANEL
CHOLESTEROL: 146 mg/dL (ref 0–200)
HDL: 60 mg/dL (ref >=40)
LDL Cholesterol: 70 mg/dL (ref 0–99)
Total CHOL/HDL Ratio: 2.4 Ratio
Triglycerides: 80 mg/dL (ref <150)
VLDL: 16 mg/dL (ref 0–40)

## 2014-10-17 LAB — CBC WITH DIFFERENTIAL/PLATELET
Basophils Absolute: 0 10*3/uL (ref 0.0–0.1)
Basophils Relative: 0 % (ref 0–1)
Eosinophils Absolute: 0.2 10*3/uL (ref 0.0–0.7)
Eosinophils Relative: 3 % (ref 0–5)
HCT: 44.3 % (ref 39.0–52.0)
Hemoglobin: 14.9 g/dL (ref 13.0–17.0)
LYMPHS ABS: 1.5 10*3/uL (ref 0.7–4.0)
LYMPHS PCT: 28 % (ref 12–46)
MCH: 29.9 pg (ref 26.0–34.0)
MCHC: 33.6 g/dL (ref 30.0–36.0)
MCV: 88.8 fL (ref 78.0–100.0)
MONO ABS: 0.4 10*3/uL (ref 0.1–1.0)
MPV: 9.4 fL (ref 8.6–12.4)
Monocytes Relative: 7 % (ref 3–12)
NEUTROS ABS: 3.3 10*3/uL (ref 1.7–7.7)
Neutrophils Relative %: 62 % (ref 43–77)
Platelets: 174 10*3/uL (ref 150–400)
RBC: 4.99 MIL/uL (ref 4.22–5.81)
RDW: 14 % (ref 11.5–15.5)
WBC: 5.3 10*3/uL (ref 4.0–10.5)

## 2014-10-17 LAB — BASIC METABOLIC PANEL WITH GFR
BUN: 12 mg/dL (ref 6–23)
CO2: 27 mEq/L (ref 19–32)
Calcium: 9.3 mg/dL (ref 8.4–10.5)
Chloride: 106 mEq/L (ref 96–112)
Creat: 0.77 mg/dL (ref 0.50–1.35)
GFR, Est African American: >89 mL/min
Glucose, Bld: 87 mg/dL (ref 70–99)
Potassium: 4.2 mEq/L (ref 3.5–5.3)
Sodium: 142 mEq/L (ref 135–145)

## 2014-10-17 LAB — TSH: TSH: 0.781 u[IU]/mL (ref 0.350–4.500)

## 2014-10-17 LAB — MAGNESIUM: MAGNESIUM: 2 mg/dL (ref 1.5–2.5)

## 2014-10-17 MED ORDER — AZITHROMYCIN 250 MG PO TABS: ORAL_TABLET | ORAL | Status: DC

## 2014-10-17 NOTE — Patient Instructions (Signed)

## 2014-10-17 NOTE — Progress Notes (Signed)
Patient ID: Damon [name], male   DOB: 1946-03-27, 69 y.o.   MRN: [id number]   This very nice 69 y.o. MWM presents for 3 month follow up with Hypertension, ASCAD, Hyperlipidemia, Pre-Diabetes and Vitamin D Deficiency.    Patient is treated for HTN since 1996  & BP has been controlled at home. Today's BP: 118/74 mmHg. Patient has had no complaints of any cardiac type chest pain, palpitations, dyspnea/orthopnea/PND, dizziness, claudication, or dependent edema.Pt has hx/o pAfib & had an ablation in 2012 and did well until a reoccurrence prompted an elective CV in Feb this year.  Pt is also s/p DES placement in May 2015 and is followed by Dr's Alred & Aundra Dubin.    Hyperlipidemia is controlled with diet & meds. Patient denies myalgias or other med SE's. Last Lipids were at goal - Total Chol 136; HDL 55; LDL 65; Trig 78 on 07/11/2014:   Also, the patient has history of Morbid Obesity (BMI 31+) and consequent PreDiabetes since May 2011 with A1c 5.7% and has had no symptoms of reactive hypoglycemia, diabetic polys, paresthesias or visual blurring.  Last A1c was 5.3% on   12/28/2013.   Further, the patient also has history of Vitamin D Deficiency and supplements vitamin D without any suspected side-effects. Last vitamin D was  64 on  07/11/2014.  Medication Sig  . Ascorbic Acid (VITAMIN C) 1000 MG tablet Take 1,000 mg by mouth daily as needed (for calcium).   . bisoprolol-hctz (ZIAC) 10-6.25 MG per tablet Take 1 tablet by mouth daily.  Marland Kitchen VITAMIN D Take 5,000 Units by mouth daily.   . clopidogrel (PLAVIX) 75 MG tablet Take 75 mg by mouth daily with breakfast.  . MULITIVIT WITH MIN Take 1 tablet by mouth daily. FOR MEN  . nitroGLYCERIN 0.4 MG SL tablet Place 1 tablet (0.4 mg total) under the tongue every 5 (five) minutes as needed for chest pain.  . rivaroxaban (XARELTO) 20 MG TABS tablet Take 1 tablet (20 mg total) by mouth daily with supper.  . rosuvastatin (CRESTOR) 40 MG tablet Take 1 tablet (40 mg  total) by mouth daily.  Marland Kitchen diltiazem (CARDIZEM CD) 120 MG 24 hr capsule Take 1 capsule (120 mg total) by mouth daily.   Allergies  Allergen Reactions  . Zetia [Ezetimibe] Other (See Comments)    myalgias   PMHx:   Past Medical History  Diagnosis Date  . Hyperlipidemia   . Mitral regurgitation   . Bladder cancer   . OSA on CPAP   . Hearing loss of both ears   . History of colon polyps     BENIGN  . PAD (peripheral artery disease)     ABI--  RIGHT SIDE NORMAL LEFT SIDE MODERATELY REDUCED W/ DISTAL LEFT SFA STENOSIS//  MILD CALDICATION  . S/P ablation of atrial fibrillation     07-27-2012  . Anticoagulant long-term use     XARELTO  . Hypertension   . PAF (paroxysmal atrial fibrillation) dx  2007 /   PREVIOUS CARDIOVERTED X3  LAST ONE 07-27-2012  POST  ABLATION  07-27-2012    PRIMARY CARDIOLOGIST-  DR [name] CARDIO--  DR [name]  . Pre-diabetes   . Vitamin D deficiency    Immunization History  Administered Date(s) Administered  . DT 07/22/2003, 12/28/2013  . Influenza Whole 05/19/2013  . Influenza,inj,Quad PF,36+ Mos 05/09/2014  . Pneumococcal Conjugate-13 07/11/2014  . Pneumococcal Polysaccharide-23 12/04/2009  . Tdap 09/22/2013   Past Surgical History  Procedure Laterality Date  .  Total hip arthroplasty  07/25/2011    Procedure: TOTAL HIP ARTHROPLASTY ANTERIOR APPROACH;  Surgeon: Mcarthur Rossetti;  Location: [facility];  Service: Orthopedics;  Laterality: Right;  . Tee without cardioversion  07/26/2012    Procedure: TRANSESOPHAGEAL ECHOCARDIOGRAM (TEE);  Surgeon: [name], MD;  Location: [facility] ENDOSCOPY;  Service: Cardiovascular;  Laterality: N/A;  . Cardioversion  08/06/2012    Procedure: CARDIOVERSION;  Surgeon: [name], MD;  Location: [place];  Service: Cardiovascular;  Laterality: N/A;  . Total hip arthroplasty Left 02-08-2010  . Cataract extraction w/ intraocular lens  implant, bilateral    . Cardiac electrophysiology study and ablation   07-27-2012  DR ALLRED    SUCCESSFUL ABLATION OF A-FIB  . Tonsillectomy  age 46  . Cystoscopy w/ retrogrades Bilateral 04/12/2013    Procedure: CYSTOSCOPY WITH RETROGRADE PYELOGRAM;  Surgeon: [name], MD;  Location: [facility];  Service: Urology;  Laterality: Bilateral;  . Transurethral resection of bladder tumor with gyrus (turbt-gyrus) N/A 04/12/2013    Procedure: TRANSURETHRAL RESECTION OF BLADDER TUMOR WITH GYRUS (TURBT-GYRUS);  Surgeon: [name], MD;  Location: [facility];  Service: Urology;  Laterality: N/A;  . Atrial fibrillation ablation N/A 07/27/2012    Procedure: ATRIAL FIBRILLATION ABLATION;  Surgeon: [name], MD;  Location: [facility] CATH LAB;  Service: Cardiovascular;  Laterality: N/A;  . Left heart catheterization with coronary angiogram N/A 11/24/2013    Procedure: LEFT HEART CATHETERIZATION WITH CORONARY ANGIOGRAM;  Surgeon: [name], MD;  Location: [facility] CATH LAB;  Service: Cardiovascular;  Laterality: N/A;  . Percutaneous coronary stent intervention (pci-s)  11/24/2013    Procedure: PERCUTANEOUS CORONARY STENT INTERVENTION (PCI-S);  Surgeon: [name], MD;  Location: [facility] CATH LAB;  Service: Cardiovascular;;  . Percutaneous coronary rotoblator intervention (pci-r) N/A 11/28/2013    Procedure: PERCUTANEOUS CORONARY ROTOBLATOR INTERVENTION (PCI-R);  Surgeon: [name], MD;  Location: [facility] CATH LAB;  Service: Cardiovascular;  Laterality: N/A;  . Tee without cardioversion N/A 08/25/2014    Procedure: TRANSESOPHAGEAL ECHOCARDIOGRAM (TEE);  Surgeon: Larey Dresser, MD;  Location: [place];  Service: Cardiovascular;  Laterality: N/A;  . Cardioversion N/A 08/25/2014    Procedure: CARDIOVERSION;  Surgeon: Larey Dresser, MD;  Location: [facility] ENDOSCOPY;  Service: Cardiovascular;  Laterality: N/A;   FHx:    Reviewed / unchanged  SHx:    Reviewed / unchanged  Systems Review:  Constitutional: Denies fever, chills, wt  changes, headaches, insomnia, fatigue, night sweats, change in appetite. Eyes: Denies redness, blurred vision, diplopia, discharge, itchy, watery eyes.  ENT: Denies discharge, congestion, post nasal drip, epistaxis, sore throat, earache, hearing loss, dental pain, tinnitus, vertigo, sinus pain, snoring.  CV: Denies chest pain, palpitations, irregular heartbeat, syncope, dyspnea, diaphoresis, orthopnea, PND, claudication or edema. Respiratory: denies cough, dyspnea, DOE, pleurisy, hoarseness, laryngitis, wheezing.  Gastrointestinal: Denies dysphagia, odynophagia, heartburn, reflux, water brash, abdominal pain or cramps, nausea, vomiting, bloating, diarrhea, constipation, hematemesis, melena, hematochezia  or hemorrhoids. Genitourinary: Denies dysuria, frequency, urgency, nocturia, hesitancy, discharge, hematuria or flank pain. Musculoskeletal: Denies arthralgias, myalgias, stiffness, jt. swelling, pain, limping or strain/sprain.  Skin: Denies pruritus, rash, hives, warts, acne, eczema or change in skin lesion(s). Neuro: No weakness, tremor, incoordination, spasms, paresthesia or pain. Psychiatric: Denies confusion, memory loss or sensory loss. Endo: Denies change in weight, skin or hair change.  Heme/Lymph: No excessive bleeding, bruising or enlarged lymph nodes.  Physical Exam  BP 118/74   Pulse 72  Temp(Src) 97.5 F   Resp 16  Ht 5' 8.75"   Wt 208 lb 9.6 oz     BMI 31.04   Appears well nourished and in no distress. Eyes: PERRLA, EOMs, conjunctiva no swelling or erythema. Sinuses: No frontal/maxillary tenderness ENT/Mouth: EAC's clear, TM's nl w/o erythema, bulging. Nares clear w/o erythema, swelling, exudates. Oropharynx clear without erythema or exudates. Oral hygiene is good. Tongue normal, non obstructing. Hearing intact.  Neck: Supple. Thyroid nl. Car 2+/2+ without bruits, nodes or JVD. Chest: Respirations nl with BS clear & equal w/o rales, rhonchi, wheezing or stridor.  Cor:  Heart sounds normal w/ regular rate and rhythm without sig. murmurs, gallops, clicks, or rubs. Peripheral pulses normal and equal  without edema.  Abdomen: Soft & bowel sounds normal. Non-tender w/o guarding, rebound, hernias, masses, or organomegaly.  Lymphatics: Unremarkable.  Musculoskeletal: Full ROM all peripheral extremities, joint stability, 5/5 strength, and normal gait.  Skin: Warm, dry without exposed rashes, lesions or ecchymosis apparent.  Neuro: Cranial nerves intact, reflexes equal bilaterally. Sensory-motor testing grossly intact. Tendon reflexes grossly intact.  Pysch: Alert & oriented x 3.  Insight and judgement nl & appropriate. No ideations.  Assessment and Plan:   1. Essential hypertension  - TSH  2. Hyperlipidemia  - Lipid panel  3. Prediabetes  - Hemoglobin A1c - Insulin, random  4. Vitamin D deficiency  - Vit D  25 hydroxy (rtn osteoporosis monitoring)  5. Medication management  - CBC with Differential/Platelet - BASIC METABOLIC PANEL WITH GFR - Hepatic function panel - Magnesium  6. ASCAD, s/p DES and s/p Ablation pAfib -  7. Morbid Obesity (BMI 31) -   Recommended regular exercise, BP monitoring, weight control, and discussed med and SE's. Recommended labs to assess and monitor clinical status. Further disposition pending results of labs. Over 30 minutes of exam, counseling, chart review was performed

## 2014-10-18 LAB — INSULIN, RANDOM: INSULIN: 5.6 u[IU]/mL (ref 2.0–19.6)

## 2014-10-18 LAB — HEMOGLOBIN A1C
Hgb A1c MFr Bld: 5.6 % (ref <5.7)
Mean Plasma Glucose: 114 mg/dL (ref <117)

## 2014-10-18 LAB — VITAMIN D 25 HYDROXY (VIT D DEFICIENCY, FRACTURES): Vit D, 25-Hydroxy: 49 ng/mL (ref 30–100)

## 2014-10-24 ENCOUNTER — Ambulatory Visit: Payer: Self-pay | Admitting: Internal Medicine

## 2014-11-10 ENCOUNTER — Encounter (HOSPITAL_COMMUNITY): Payer: Self-pay | Admitting: Nurse Practitioner

## 2014-11-10 ENCOUNTER — Other Ambulatory Visit (HOSPITAL_COMMUNITY): Payer: Self-pay | Admitting: *Deleted

## 2014-11-10 ENCOUNTER — Telehealth: Payer: Self-pay | Admitting: Internal Medicine

## 2014-11-10 ENCOUNTER — Ambulatory Visit (HOSPITAL_COMMUNITY)
Admission: RE | Admit: 2014-11-10 | Discharge: 2014-11-10 | Disposition: A | Discharge status: Home / Self Care | Payer: Medicare Other | Source: Ambulatory Visit | Attending: Nurse Practitioner | Admitting: Nurse Practitioner

## 2014-11-10 VITALS — BP 110/78 | HR 97 | Ht 69.0 in | Wt 204.0 lb

## 2014-11-10 DIAGNOSIS — I481 Persistent atrial fibrillation: Secondary | ICD-10-CM | POA: Diagnosis present

## 2014-11-10 DIAGNOSIS — I2510 Atherosclerotic heart disease of native coronary artery without angina pectoris: Secondary | ICD-10-CM | POA: Insufficient documentation

## 2014-11-10 DIAGNOSIS — I10 Essential (primary) hypertension: Secondary | ICD-10-CM | POA: Insufficient documentation

## 2014-11-10 DIAGNOSIS — I4819 Other persistent atrial fibrillation: Secondary | ICD-10-CM

## 2014-11-10 LAB — BASIC METABOLIC PANEL
ANION GAP: 8 (ref 5–15)
BUN: 16 mg/dL (ref 6–23)
CALCIUM: 9.4 mg/dL (ref 8.4–10.5)
CO2: 24 mmol/L (ref 19–32)
Chloride: 108 mmol/L (ref 96–112)
Creatinine, Ser: 0.81 mg/dL (ref 0.50–1.35)
GFR calc Af Amer: >90 mL/min (ref >90)
GFR calc non Af Amer: 89 mL/min — ABNORMAL LOW (ref >90)
GLUCOSE: 109 mg/dL — AB (ref 70–99)
Potassium: 3.9 mmol/L (ref 3.5–5.1)
Sodium: 140 mmol/L (ref 135–145)

## 2014-11-10 LAB — CBC
HEMATOCRIT: 47.8 % (ref 39.0–52.0)
HEMOGLOBIN: 16.6 g/dL (ref 13.0–17.0)
MCH: 30.2 pg (ref 26.0–34.0)
MCHC: 34.7 g/dL (ref 30.0–36.0)
MCV: 87.1 fL (ref 78.0–100.0)
PLATELETS: 217 10*3/uL (ref 150–400)
RBC: 5.49 MIL/uL (ref 4.22–5.81)
RDW: 13 % (ref 11.5–15.5)
WBC: 7.4 10*3/uL (ref 4.0–10.5)

## 2014-11-10 MED ORDER — DILTIAZEM HCL ER COATED BEADS 120 MG PO CP24: 120.0000 mg | ORAL_CAPSULE | Freq: Every day | ORAL | Status: DC

## 2014-11-10 NOTE — Telephone Encounter (Signed)
New Message  Pt called states that he is back in Afib. Req a call back to discuss possible sooner appt//sr

## 2014-11-10 NOTE — Patient Instructions (Signed)
Cardioversion is scheduled for Monday, April 25th @ 2pm Arrive at [facility] at [employer] and go to admitting Do not eat or drink anything after midnight. Take your medication with a sip of water prior to arrival.   Your physician has recommended you make the following change in your medication:  1)Cardizem 120mg  once a day.

## 2014-11-10 NOTE — Telephone Encounter (Signed)
Talked with patient stated his heart rate has been up since last night; highest 120 but it jumps around 90-110s.  No symptoms other than can feel some pressure in his chest but states hes not having chest pain.  Offered for him to come in today for EKG and he wanted to do this. Appointment made in AFib clinic for 11 today.

## 2014-11-10 NOTE — Progress Notes (Signed)
Patient ID: Damon [name], male   DOB: February 26, 1946, 68 y.o.   MRN: [id number]        PCP: [name], MD Primary Cardiologist: Omid Deardorff is a 69 y.o. male who presents today for urgent  evaluation in the afib clinic. H/o Afib ablation 1/14 and last DCCV 2/16 following an upper respiratory infection. Also h/o  PCI of the LCx with a Resolute Integrity DES 11/2013.  He is on concomitant therapy with plavix and xarelto for a chadsvasc score of 3.No bleeding issues.  He had successful DCCV 08/25/14 and has been maintaining SR until yesterday when he started into afibwith v rates in the 120's. No identifiable triggers. No alcohol, using cpap consistently. Started back on cardizem 120 mg qd with v rates now in 90's. He is symptomatic with fatigue when in afib. Usually requires DCCV to restore rhythm. He is wanting to discuss with Dr. [name] a second ablation when he sees him 6/2. No missed doses of xarelto.    Today, he denies symptoms of chest pain, shortness of breath,  lower extremity edema, dizziness, presyncope, or syncope.  He reports frequent calf pain with moderate activity.Positive for fatigue.The patient is otherwise without complaint today.   Past Medical History  Diagnosis Date  . Hyperlipidemia   . Mitral regurgitation   . Bladder cancer   . OSA on CPAP   . Hearing loss of both ears   . History of colon polyps     BENIGN  . PAD (peripheral artery disease)     ABI--  RIGHT SIDE NORMAL LEFT SIDE MODERATELY REDUCED W/ DISTAL LEFT SFA STENOSIS//  MILD CALDICATION  . S/P ablation of atrial fibrillation     07-27-2012  . Anticoagulant long-term use     XARELTO  . Hypertension   . PAF (paroxysmal atrial fibrillation) dx  2007 /   PREVIOUS CARDIOVERTED X3  LAST ONE 07-27-2012  POST  ABLATION  07-27-2012    PRIMARY CARDIOLOGIST-  DR [name] CARDIO--  DR [name]  . Pre-diabetes   . Vitamin D deficiency    Past Surgical History  Procedure  Laterality Date  . Total hip arthroplasty  07/25/2011    Procedure: TOTAL HIP ARTHROPLASTY ANTERIOR APPROACH;  Surgeon: Mcarthur Rossetti;  Location: [facility];  Service: Orthopedics;  Laterality: Right;  . Tee without cardioversion  07/26/2012    Procedure: TRANSESOPHAGEAL ECHOCARDIOGRAM (TEE);  Surgeon: [name], MD;  Location: [facility] ENDOSCOPY;  Service: Cardiovascular;  Laterality: N/A;  . Cardioversion  08/06/2012    Procedure: CARDIOVERSION;  Surgeon: [name], MD;  Location: [place];  Service: Cardiovascular;  Laterality: N/A;  . Total hip arthroplasty Left 02-08-2010  . Cataract extraction w/ intraocular lens  implant, bilateral    . Cardiac electrophysiology study and ablation  07-27-2012  DR ALLRED    SUCCESSFUL ABLATION OF A-FIB  . Tonsillectomy  age 10  . Cystoscopy w/ retrogrades Bilateral 04/12/2013    Procedure: CYSTOSCOPY WITH RETROGRADE PYELOGRAM;  Surgeon: [name], MD;  Location: [facility];  Service: Urology;  Laterality: Bilateral;  . Transurethral resection of bladder tumor with gyrus (turbt-gyrus) N/A 04/12/2013    Procedure: TRANSURETHRAL RESECTION OF BLADDER TUMOR WITH GYRUS (TURBT-GYRUS);  Surgeon: [name], MD;  Location: [facility];  Service: Urology;  Laterality: N/A;  . Atrial fibrillation ablation N/A 07/27/2012    Procedure: ATRIAL FIBRILLATION ABLATION;  Surgeon: [name], MD;  Location: [facility]  CATH LAB;  Service: Cardiovascular;  Laterality: N/A;  . Left heart catheterization with coronary angiogram N/A 11/24/2013    Procedure: LEFT HEART CATHETERIZATION WITH CORONARY ANGIOGRAM;  Surgeon: [name], MD;  Location: [facility] CATH LAB;  Service: Cardiovascular;  Laterality: N/A;  . Percutaneous coronary stent intervention (pci-s)  11/24/2013    Procedure: PERCUTANEOUS CORONARY STENT INTERVENTION (PCI-S);  Surgeon: [name], MD;  Location: [facility] CATH LAB;  Service: Cardiovascular;;  . Percutaneous coronary  rotoblator intervention (pci-r) N/A 11/28/2013    Procedure: PERCUTANEOUS CORONARY ROTOBLATOR INTERVENTION (PCI-R);  Surgeon: [name], MD;  Location: [facility] CATH LAB;  Service: Cardiovascular;  Laterality: N/A;  . Tee without cardioversion N/A 08/25/2014    Procedure: TRANSESOPHAGEAL ECHOCARDIOGRAM (TEE);  Surgeon: Larey Dresser, MD;  Location: [place];  Service: Cardiovascular;  Laterality: N/A;  . Cardioversion N/A 08/25/2014    Procedure: CARDIOVERSION;  Surgeon: Larey Dresser, MD;  Location: [place];  Service: Cardiovascular;  Laterality: N/A;    Current Outpatient Prescriptions  Medication Sig Dispense Refill  . Ascorbic Acid (VITAMIN C) 1000 MG tablet Take 1,000 mg by mouth daily as needed (for calcium).     . bisoprolol-hydrochlorothiazide (ZIAC) 10-6.25 MG per tablet Take 1 tablet by mouth daily. 90 tablet 3  . cholecalciferol (VITAMIN D) 1000 UNITS tablet Take 5,000 Units by mouth daily.     . clopidogrel (PLAVIX) 75 MG tablet Take 75 mg by mouth daily with breakfast.    . Multiple Vitamin (MULITIVITAMIN WITH MINERALS) TABS Take 1 tablet by mouth daily. FOR MEN    . nitroGLYCERIN (NITROSTAT) 0.4 MG SL tablet Place 1 tablet (0.4 mg total) under the tongue every 5 (five) minutes as needed for chest pain. 25 tablet 2  . rivaroxaban (XARELTO) 20 MG TABS tablet Take 1 tablet (20 mg total) by mouth daily with supper. 30 tablet 11  . rosuvastatin (CRESTOR) 40 MG tablet Take 1 tablet (40 mg total) by mouth daily. 30 tablet 0  . diltiazem (CARDIZEM CD) 120 MG 24 hr capsule Take 1 capsule (120 mg total) by mouth daily. 30 capsule 1   No current facility-administered medications for this encounter.    Physical Exam: Filed Vitals:   11/10/14 1054  BP: 110/78  Pulse: 97  Height: 5\' 9"  (1.753 m)  Weight: 204 lb (92.534 kg)    GEN- The patient is well appearing, alert and oriented x 3 today.   Head- normocephalic, atraumatic Eyes-  Sclera clear, conjunctiva pink Ears-  hearing intact Oropharynx- clear Lungs- Clear to ausculation bilaterally, normal work of breathing Heart- Irregular rate and rhythm, no murmurs, rubs or gallops, PMI not laterally displaced GI- soft, NT, ND, + BS Extremities- no clubbing, cyanosis, or edema  ekg today reveals afib at 97 bpm. Epic notes reviewed.  Assessment and Plan:  1.Persitent afib Onset afib with RVR yesterday.Usually requires DCCV to restore rhythm. Will schedule  DCCV for Monday, no missed doses of xarelto. Chads2vasc score of 3(HTN, AGE, CAD) Continue on Cardizem 120 mg a day.  CBC,BMET today  2.  HTN Stable No change required today  3. CAD Stable No change required today Continue plavix.   Follow-up with Dr. [name] in June as scheduled at which time pt wants to discuss repeat ablation.

## 2014-11-11 NOTE — Addendum Note (Signed)
Encounter addended by: Yehuda Mao on: 11/11/2014 12:05 PM<BR>     Documentation filed: Charges VN

## 2014-11-13 ENCOUNTER — Encounter (HOSPITAL_COMMUNITY)
Admission: RE | Disposition: A | Discharge status: Home / Self Care | Payer: Self-pay | Source: Ambulatory Visit | Attending: Cardiology

## 2014-11-13 ENCOUNTER — Ambulatory Visit (HOSPITAL_COMMUNITY): Payer: Medicare Other | Admitting: Anesthesiology

## 2014-11-13 ENCOUNTER — Ambulatory Visit (HOSPITAL_COMMUNITY)
Admission: RE | Admit: 2014-11-13 | Discharge: 2014-11-13 | Disposition: A | Discharge status: Home / Self Care | Payer: Medicare Other | Source: Ambulatory Visit | Attending: Cardiology | Admitting: Cardiology

## 2014-11-13 DIAGNOSIS — E785 Hyperlipidemia, unspecified: Secondary | ICD-10-CM | POA: Insufficient documentation

## 2014-11-13 DIAGNOSIS — H9193 Unspecified hearing loss, bilateral: Secondary | ICD-10-CM | POA: Insufficient documentation

## 2014-11-13 DIAGNOSIS — I481 Persistent atrial fibrillation: Secondary | ICD-10-CM | POA: Insufficient documentation

## 2014-11-13 DIAGNOSIS — I2510 Atherosclerotic heart disease of native coronary artery without angina pectoris: Secondary | ICD-10-CM | POA: Insufficient documentation

## 2014-11-13 DIAGNOSIS — I739 Peripheral vascular disease, unspecified: Secondary | ICD-10-CM | POA: Insufficient documentation

## 2014-11-13 DIAGNOSIS — I10 Essential (primary) hypertension: Secondary | ICD-10-CM | POA: Insufficient documentation

## 2014-11-13 DIAGNOSIS — Z87891 Personal history of nicotine dependence: Secondary | ICD-10-CM | POA: Insufficient documentation

## 2014-11-13 DIAGNOSIS — G4733 Obstructive sleep apnea (adult) (pediatric): Secondary | ICD-10-CM | POA: Diagnosis not present

## 2014-11-13 DIAGNOSIS — Z9989 Dependence on other enabling machines and devices: Secondary | ICD-10-CM | POA: Insufficient documentation

## 2014-11-13 DIAGNOSIS — Z8551 Personal history of malignant neoplasm of bladder: Secondary | ICD-10-CM | POA: Diagnosis not present

## 2014-11-13 DIAGNOSIS — Z7902 Long term (current) use of antithrombotics/antiplatelets: Secondary | ICD-10-CM | POA: Diagnosis not present

## 2014-11-13 DIAGNOSIS — Z7901 Long term (current) use of anticoagulants: Secondary | ICD-10-CM | POA: Insufficient documentation

## 2014-11-13 DIAGNOSIS — Z96641 Presence of right artificial hip joint: Secondary | ICD-10-CM | POA: Diagnosis not present

## 2014-11-13 DIAGNOSIS — Z955 Presence of coronary angioplasty implant and graft: Secondary | ICD-10-CM | POA: Diagnosis not present

## 2014-11-13 DIAGNOSIS — E559 Vitamin D deficiency, unspecified: Secondary | ICD-10-CM | POA: Diagnosis not present

## 2014-11-13 DIAGNOSIS — I4891 Unspecified atrial fibrillation: Secondary | ICD-10-CM | POA: Diagnosis present

## 2014-11-13 DIAGNOSIS — Z9889 Other specified postprocedural states: Secondary | ICD-10-CM | POA: Insufficient documentation

## 2014-11-13 HISTORY — PX: CARDIOVERSION: SHX1299

## 2014-11-13 SURGERY — CARDIOVERSION
Anesthesia: Monitor Anesthesia Care

## 2014-11-13 MED ORDER — LIDOCAINE HCL (CARDIAC) 20 MG/ML IV SOLN
INTRAVENOUS | Status: DC | PRN
  Administered 2014-11-13: 60 mg via INTRAVENOUS

## 2014-11-13 MED ORDER — PROPOFOL 10 MG/ML IV BOLUS
INTRAVENOUS | Status: DC | PRN
  Administered 2014-11-13: 100 mg via INTRAVENOUS

## 2014-11-13 MED ORDER — SODIUM CHLORIDE 0.9 % IV SOLN
INTRAVENOUS | Status: DC
  Administered 2014-11-13: 14:00:00 via INTRAVENOUS
  Administered 2014-11-13: 1000 mL via INTRAVENOUS

## 2014-11-13 NOTE — Transfer of Care (Signed)
Immediate Anesthesia Transfer of Care Note  Patient: Damon [name]  Procedure(s) Performed: Procedure(s): CARDIOVERSION (N/A)  Patient Location: Endoscopy Unit  Anesthesia Type:MAC  Level of Consciousness: awake, alert  and oriented  Airway & Oxygen Therapy: Patient Spontanous Breathing and Patient connected to nasal cannula oxygen  Post-op Assessment: Report given to RN and Post -op Vital signs reviewed and stable  Post vital signs: Reviewed and stable  Last Vitals:  Filed Vitals:   11/13/14 1358  BP:   Temp: 15.3 C    Complications: No apparent anesthesia complications

## 2014-11-13 NOTE — Anesthesia Preprocedure Evaluation (Addendum)
Anesthesia Evaluation  Patient identified by MRN, date of birth, ID band Patient awake    Reviewed: Allergy & Precautions, NPO status , Patient's Chart, lab work & pertinent test results  Airway Mallampati: II  TM Distance: >3 FB Neck ROM: Full    Dental  (+) Teeth Intact   Pulmonary former smoker,  breath sounds clear to auscultation        Cardiovascular hypertension, Pt. on medications and Pt. on home beta blockers + dysrhythmias Rhythm:Irregular Rate:Normal     Neuro/Psych    GI/Hepatic   Endo/Other    Renal/GU      Musculoskeletal   Abdominal   Peds  Hematology   Anesthesia Other Findings   Reproductive/Obstetrics                            Anesthesia Physical Anesthesia Plan  ASA: III  Anesthesia Plan: MAC   Post-op Pain Management:    Induction: Intravenous  Airway Management Planned: Mask  Additional Equipment:   Intra-op Plan:   Post-operative Plan:   Informed Consent: I have reviewed the patients History and Physical, chart, labs and discussed the procedure including the risks, benefits and alternatives for the proposed anesthesia with the patient or authorized representative who has indicated his/her understanding and acceptance.   Dental advisory given  Plan Discussed with: Anesthesiologist and Surgeon  Anesthesia Plan Comments:         Anesthesia Quick Evaluation

## 2014-11-13 NOTE — H&P (View-Only) (Signed)
Patient ID: Damon [name], male   DOB: 1946-01-08, 69 y.o.   MRN: [id number]        PCP: [name], MD Primary Cardiologist: [name] is a 69 y.o. male who presents today for urgent  evaluation in the afib clinic. H/o Afib ablation 1/14 and last DCCV 2/16 following an upper respiratory infection. Also h/o  PCI of the LCx with a Resolute Integrity DES 11/2013.  He is on concomitant therapy with plavix and xarelto for a chadsvasc score of 3.No bleeding issues.  He had successful DCCV 08/25/14 and has been maintaining SR until yesterday when he started into afibwith v rates in the 120's. No identifiable triggers. No alcohol, using cpap consistently. Started back on cardizem 120 mg qd with v rates now in 90's. He is symptomatic with fatigue when in afib. Usually requires DCCV to restore rhythm. He is wanting to discuss with Dr. [name] a second ablation when he sees him 6/2. No missed doses of xarelto.    Today, he denies symptoms of chest pain, shortness of breath,  lower extremity edema, dizziness, presyncope, or syncope.  He reports frequent calf pain with moderate activity.Positive for fatigue.The patient is otherwise without complaint today.   Past Medical History  Diagnosis Date  . Hyperlipidemia   . Mitral regurgitation   . Bladder cancer   . OSA on CPAP   . Hearing loss of both ears   . History of colon polyps     BENIGN  . PAD (peripheral artery disease)     ABI--  RIGHT SIDE NORMAL LEFT SIDE MODERATELY REDUCED W/ DISTAL LEFT SFA STENOSIS//  MILD CALDICATION  . S/P ablation of atrial fibrillation     07-27-2012  . Anticoagulant long-term use     XARELTO  . Hypertension   . PAF (paroxysmal atrial fibrillation) dx  2007 /   PREVIOUS CARDIOVERTED X3  LAST ONE 07-27-2012  POST  ABLATION  07-27-2012    PRIMARY CARDIOLOGIST-  DR [name] CARDIO--  DR [name]  . Pre-diabetes   . Vitamin D deficiency    Past Surgical History  Procedure  Laterality Date  . Total hip arthroplasty  07/25/2011    Procedure: TOTAL HIP ARTHROPLASTY ANTERIOR APPROACH;  Surgeon: Mcarthur Rossetti;  Location: [facility];  Service: Orthopedics;  Laterality: Right;  . Tee without cardioversion  07/26/2012    Procedure: TRANSESOPHAGEAL ECHOCARDIOGRAM (TEE);  Surgeon: [name], MD;  Location: [facility] ENDOSCOPY;  Service: Cardiovascular;  Laterality: N/A;  . Cardioversion  08/06/2012    Procedure: CARDIOVERSION;  Surgeon: [name], MD;  Location: [place];  Service: Cardiovascular;  Laterality: N/A;  . Total hip arthroplasty Left 02-08-2010  . Cataract extraction w/ intraocular lens  implant, bilateral    . Cardiac electrophysiology study and ablation  07-27-2012  DR ALLRED    SUCCESSFUL ABLATION OF A-FIB  . Tonsillectomy  age 39  . Cystoscopy w/ retrogrades Bilateral 04/12/2013    Procedure: CYSTOSCOPY WITH RETROGRADE PYELOGRAM;  Surgeon: [name], MD;  Location: [facility];  Service: Urology;  Laterality: Bilateral;  . Transurethral resection of bladder tumor with gyrus (turbt-gyrus) N/A 04/12/2013    Procedure: TRANSURETHRAL RESECTION OF BLADDER TUMOR WITH GYRUS (TURBT-GYRUS);  Surgeon: [name], MD;  Location: [facility];  Service: Urology;  Laterality: N/A;  . Atrial fibrillation ablation N/A 07/27/2012    Procedure: ATRIAL FIBRILLATION ABLATION;  Surgeon: [name], MD;  Location: [facility]  CATH LAB;  Service: Cardiovascular;  Laterality: N/A;  . Left heart catheterization with coronary angiogram N/A 11/24/2013    Procedure: LEFT HEART CATHETERIZATION WITH CORONARY ANGIOGRAM;  Surgeon: [name], MD;  Location: [facility] CATH LAB;  Service: Cardiovascular;  Laterality: N/A;  . Percutaneous coronary stent intervention (pci-s)  11/24/2013    Procedure: PERCUTANEOUS CORONARY STENT INTERVENTION (PCI-S);  Surgeon: [name], MD;  Location: [facility] CATH LAB;  Service: Cardiovascular;;  . Percutaneous coronary  rotoblator intervention (pci-r) N/A 11/28/2013    Procedure: PERCUTANEOUS CORONARY ROTOBLATOR INTERVENTION (PCI-R);  Surgeon: [name], MD;  Location: [facility] CATH LAB;  Service: Cardiovascular;  Laterality: N/A;  . Tee without cardioversion N/A 08/25/2014    Procedure: TRANSESOPHAGEAL ECHOCARDIOGRAM (TEE);  Surgeon: Larey Dresser, MD;  Location: [place];  Service: Cardiovascular;  Laterality: N/A;  . Cardioversion N/A 08/25/2014    Procedure: CARDIOVERSION;  Surgeon: Larey Dresser, MD;  Location: [place];  Service: Cardiovascular;  Laterality: N/A;    Current Outpatient Prescriptions  Medication Sig Dispense Refill  . Ascorbic Acid (VITAMIN C) 1000 MG tablet Take 1,000 mg by mouth daily as needed (for calcium).     . bisoprolol-hydrochlorothiazide (ZIAC) 10-6.25 MG per tablet Take 1 tablet by mouth daily. 90 tablet 3  . cholecalciferol (VITAMIN D) 1000 UNITS tablet Take 5,000 Units by mouth daily.     . clopidogrel (PLAVIX) 75 MG tablet Take 75 mg by mouth daily with breakfast.    . Multiple Vitamin (MULITIVITAMIN WITH MINERALS) TABS Take 1 tablet by mouth daily. FOR MEN    . nitroGLYCERIN (NITROSTAT) 0.4 MG SL tablet Place 1 tablet (0.4 mg total) under the tongue every 5 (five) minutes as needed for chest pain. 25 tablet 2  . rivaroxaban (XARELTO) 20 MG TABS tablet Take 1 tablet (20 mg total) by mouth daily with supper. 30 tablet 11  . rosuvastatin (CRESTOR) 40 MG tablet Take 1 tablet (40 mg total) by mouth daily. 30 tablet 0  . diltiazem (CARDIZEM CD) 120 MG 24 hr capsule Take 1 capsule (120 mg total) by mouth daily. 30 capsule 1   No current facility-administered medications for this encounter.    Physical Exam: Filed Vitals:   11/10/14 1054  BP: 110/78  Pulse: 97  Height: 5\' 9"  (1.753 m)  Weight: 204 lb (92.534 kg)    GEN- The patient is well appearing, alert and oriented x 3 today.   Head- normocephalic, atraumatic Eyes-  Sclera clear, conjunctiva pink Ears-  hearing intact Oropharynx- clear Lungs- Clear to ausculation bilaterally, normal work of breathing Heart- Irregular rate and rhythm, no murmurs, rubs or gallops, PMI not laterally displaced GI- soft, NT, ND, + BS Extremities- no clubbing, cyanosis, or edema  ekg today reveals afib at 97 bpm. Epic notes reviewed.  Assessment and Plan:  1.Persitent afib Onset afib with RVR yesterday.Usually requires DCCV to restore rhythm. Will schedule  DCCV for Monday, no missed doses of xarelto. Chads2vasc score of 3(HTN, AGE, CAD) Continue on Cardizem 120 mg a day.  CBC,BMET today  2.  HTN Stable No change required today  3. CAD Stable No change required today Continue plavix.   Follow-up with Dr. [name] in June as scheduled at which time pt wants to discuss repeat ablation.

## 2014-11-13 NOTE — Interval H&P Note (Signed)
History and Physical Interval Note:  11/13/2014 2:45 PM  Damon [name]  has presented today for surgery, with the diagnosis of AFIB  The various methods of treatment have been discussed with the patient and family. After consideration of risks, benefits and other options for treatment, the patient has consented to  Procedure(s): CARDIOVERSION (N/A) as a surgical intervention .  The patient's history has been reviewed, patient examined, no change in status, stable for surgery.  I have reviewed the patient's chart and labs.  Questions were answered to the patient's satisfaction.     Zaeden Lastinger R

## 2014-11-13 NOTE — Discharge Instructions (Signed)

## 2014-11-13 NOTE — Interval H&P Note (Signed)
History and Physical Interval Note:  11/13/2014 2:39 PM  Damon [name]  has presented today for surgery, with the diagnosis of AFIB  The various methods of treatment have been discussed with the patient and family. After consideration of risks, benefits and other options for treatment, the patient has consented to  Procedure(s): CARDIOVERSION (N/A) as a surgical intervention .  The patient's history has been reviewed, patient examined, no change in status, stable for surgery.  I have reviewed the patient's chart and labs.  Questions were answered to the patient's satisfaction.     TURNER,TRACI R

## 2014-11-13 NOTE — Anesthesia Postprocedure Evaluation (Signed)
  Anesthesia Post-op Note  Patient: Damon [name]  Procedure(s) Performed: Procedure(s): CARDIOVERSION (N/A)  Patient Location: Endoscopy Unit  Anesthesia Type:MAC  Level of Consciousness: awake, alert  and oriented  Airway and Oxygen Therapy: Patient Spontanous Breathing and Patient connected to nasal cannula oxygen  Post-op Pain: none  Post-op Assessment: Post-op Vital signs reviewed, Patient's Cardiovascular Status Stable and Respiratory Function Stable  Post-op Vital Signs: Reviewed and stable  Last Vitals:  Filed Vitals:   11/13/14 1358  BP:   Temp: 16.1 C    Complications: No apparent anesthesia complications

## 2014-11-13 NOTE — CV Procedure (Signed)
   Electrical Cardioversion Procedure Note Damon [name] [id number] Jul 24, 1945  Procedure: Electrical Cardioversion Indications:  Atrial Fibrillation  Time Out: Verified patient identification, verified procedure,medications/allergies/relevent history reviewed, required imaging and test results available.  Performed  Procedure Details  The patient was NPO after midnight. Anesthesia was administered at the beside  by Dr. Ola Spurr with 100mg  of propofol and Lidocaine 50mg  IV.  Cardioversion was done with synchronized biphasic defibrillation with AP pads with 150watts.  The patient converted to normal sinus rhythm. The patient tolerated the procedure well   IMPRESSION:  Successful cardioversion of atrial fibrillation    Damon [name] 11/13/2014, 2:40 PM

## 2014-11-14 ENCOUNTER — Encounter (HOSPITAL_COMMUNITY): Payer: Self-pay | Admitting: Cardiology

## 2014-11-16 ENCOUNTER — Other Ambulatory Visit: Payer: Medicare Other

## 2014-11-16 ENCOUNTER — Other Ambulatory Visit: Payer: Self-pay | Admitting: Gastroenterology

## 2014-11-16 ENCOUNTER — Ambulatory Visit
Admission: RE | Admit: 2014-11-16 | Discharge: 2014-11-16 | Disposition: A | Discharge status: Home / Self Care | Payer: Medicare Other | Source: Ambulatory Visit | Attending: Emergency Medicine | Admitting: Emergency Medicine

## 2014-11-16 DIAGNOSIS — R918 Other nonspecific abnormal finding of lung field: Secondary | ICD-10-CM

## 2014-11-22 ENCOUNTER — Other Ambulatory Visit: Payer: Self-pay | Admitting: Cardiovascular Disease

## 2014-11-22 MED ORDER — CLOPIDOGREL BISULFATE 75 MG PO TABS: 75.0000 mg | ORAL_TABLET | Freq: Every day | ORAL | Status: DC

## 2014-11-28 ENCOUNTER — Encounter: Payer: Self-pay | Admitting: Cardiovascular Disease

## 2014-11-28 ENCOUNTER — Ambulatory Visit (INDEPENDENT_AMBULATORY_CARE_PROVIDER_SITE_OTHER): Payer: Medicare Other | Admitting: Cardiovascular Disease

## 2014-11-28 VITALS — BP 116/60 | HR 48 | Ht 69.0 in | Wt 206.1 lb

## 2014-11-28 DIAGNOSIS — I480 Paroxysmal atrial fibrillation: Secondary | ICD-10-CM

## 2014-11-28 DIAGNOSIS — I2510 Atherosclerotic heart disease of native coronary artery without angina pectoris: Secondary | ICD-10-CM

## 2014-11-28 MED ORDER — ASPIRIN EC 81 MG PO TBEC: 81.0000 mg | DELAYED_RELEASE_TABLET | Freq: Every day | ORAL | Status: DC

## 2014-11-28 NOTE — Patient Instructions (Signed)
Medication Instructions:  Your physician has recommended you make the following change in your medication:  Stop Clopidogrel. Start aspirin 81 mg by mouth daily.   Labwork: none  Testing/Procedures: none  Follow-Up: Your physician wants you to follow-up in: 12 months.  You will receive a reminder letter in the mail two months in advance. If you don't receive a letter, please call our office to schedule the follow-up appointment.

## 2014-11-28 NOTE — Progress Notes (Signed)
Chief Complaint  Patient presents with  . Follow-up    History of Present Illness: 69 yo male with history of CAD, HTN and paroxysmal atrial fibrillation who is here today for follow up of his CAD. His atrial fibrillation is followed by Dr. [name]. He has not been seen in my office before today. I performed a cardiac cath in May 2015 with rotablator atherectomy of the proximal Circumflex followed by placement of a DES in the Circumflex. FFR of the LAD was not flow limiting. He has had several recent cardioversions arranged by Dr. [name].   He tells me today that he feels well. He has no chest pain or SOB. He has been cycling several days per week, 10-11 miles per day. No LE edema.   Primary Care Physician: Melford Aase  Last Lipid Profile:Lipid Panel     Component Value Date/Time   CHOL 146 10/17/2014 1232   TRIG 80 10/17/2014 1232   HDL 60 10/17/2014 1232   CHOLHDL 2.4 10/17/2014 1232   VLDL 16 10/17/2014 1232   LDLCALC 70 10/17/2014 1232     Past Medical History  Diagnosis Date  . Hyperlipidemia   . Mitral regurgitation   . Bladder cancer   . OSA on CPAP   . Hearing loss of both ears   . History of colon polyps     BENIGN  . PAD (peripheral artery disease)     ABI--  RIGHT SIDE NORMAL LEFT SIDE MODERATELY REDUCED W/ DISTAL LEFT SFA STENOSIS//  MILD CALDICATION  . S/P ablation of atrial fibrillation     07-27-2012  . Anticoagulant long-term use     XARELTO  . Hypertension   . PAF (paroxysmal atrial fibrillation) dx  2007 /   PREVIOUS CARDIOVERTED X3  LAST ONE 07-27-2012  POST  ABLATION  07-27-2012    PRIMARY CARDIOLOGIST-  DR [name] CARDIO--  DR [name]  . Pre-diabetes   . Vitamin D deficiency     Past Surgical History  Procedure Laterality Date  . Total hip arthroplasty  07/25/2011    Procedure: TOTAL HIP ARTHROPLASTY ANTERIOR APPROACH;  Surgeon: Mcarthur Rossetti;  Location: [facility];  Service: Orthopedics;  Laterality: Right;  . Tee without  cardioversion  07/26/2012    Procedure: TRANSESOPHAGEAL ECHOCARDIOGRAM (TEE);  Surgeon: [name], MD;  Location: [facility] ENDOSCOPY;  Service: Cardiovascular;  Laterality: N/A;  . Cardioversion  08/06/2012    Procedure: CARDIOVERSION;  Surgeon: [name], MD;  Location: [place];  Service: Cardiovascular;  Laterality: N/A;  . Total hip arthroplasty Left 02-08-2010  . Cataract extraction w/ intraocular lens  implant, bilateral    . Cardiac electrophysiology study and ablation  07-27-2012  DR ALLRED    SUCCESSFUL ABLATION OF A-FIB  . Tonsillectomy  age 45  . Cystoscopy w/ retrogrades Bilateral 04/12/2013    Procedure: CYSTOSCOPY WITH RETROGRADE PYELOGRAM;  Surgeon: [name], MD;  Location: [facility];  Service: Urology;  Laterality: Bilateral;  . Transurethral resection of bladder tumor with gyrus (turbt-gyrus) N/A 04/12/2013    Procedure: TRANSURETHRAL RESECTION OF BLADDER TUMOR WITH GYRUS (TURBT-GYRUS);  Surgeon: [name], MD;  Location: [facility];  Service: Urology;  Laterality: N/A;  . Atrial fibrillation ablation N/A 07/27/2012    Procedure: ATRIAL FIBRILLATION ABLATION;  Surgeon: [name], MD;  Location: [facility] CATH LAB;  Service: Cardiovascular;  Laterality: N/A;  . Left heart catheterization with coronary angiogram N/A 11/24/2013    Procedure: LEFT HEART CATHETERIZATION  WITH CORONARY ANGIOGRAM;  Surgeon: [name], MD;  Location: [facility] CATH LAB;  Service: Cardiovascular;  Laterality: N/A;  . Percutaneous coronary stent intervention (pci-s)  11/24/2013    Procedure: PERCUTANEOUS CORONARY STENT INTERVENTION (PCI-S);  Surgeon: [name], MD;  Location: [facility] CATH LAB;  Service: Cardiovascular;;  . Percutaneous coronary rotoblator intervention (pci-r) N/A 11/28/2013    Procedure: PERCUTANEOUS CORONARY ROTOBLATOR INTERVENTION (PCI-R);  Surgeon: [name], MD;  Location: [facility] CATH LAB;  Service: Cardiovascular;  Laterality: N/A;   . Tee without cardioversion N/A 08/25/2014    Procedure: TRANSESOPHAGEAL ECHOCARDIOGRAM (TEE);  Surgeon: Larey Dresser, MD;  Location: [place];  Service: Cardiovascular;  Laterality: N/A;  . Cardioversion N/A 08/25/2014    Procedure: CARDIOVERSION;  Surgeon: Larey Dresser, MD;  Location: [place];  Service: Cardiovascular;  Laterality: N/A;  . Cardioversion N/A 11/13/2014    Procedure: CARDIOVERSION;  Surgeon: Sueanne Margarita, MD;  Location: [place] ENDOSCOPY;  Service: Cardiovascular;  Laterality: N/A;    Current Outpatient Prescriptions  Medication Sig Dispense Refill  . Ascorbic Acid (VITAMIN C) 1000 MG tablet Take 1,000 mg by mouth daily as needed (for calcium).     . bisoprolol-hydrochlorothiazide (ZIAC) 10-6.25 MG per tablet Take 1 tablet by mouth daily. 90 tablet 3  . cholecalciferol (VITAMIN D) 1000 UNITS tablet Take 5,000 Units by mouth daily.     Marland Kitchen diltiazem (CARDIZEM CD) 120 MG 24 hr capsule Take 1 capsule (120 mg total) by mouth daily. 30 capsule 1  . Multiple Vitamin (MULITIVITAMIN WITH MINERALS) TABS Take 1 tablet by mouth daily. FOR MEN    . nitroGLYCERIN (NITROSTAT) 0.4 MG SL tablet Place 1 tablet (0.4 mg total) under the tongue every 5 (five) minutes as needed for chest pain. 25 tablet 2  . rivaroxaban (XARELTO) 20 MG TABS tablet Take 1 tablet (20 mg total) by mouth daily with supper. 30 tablet 11  . rosuvastatin (CRESTOR) 40 MG tablet Take 1 tablet (40 mg total) by mouth daily. 30 tablet 0  . aspirin EC 81 MG tablet Take 1 tablet (81 mg total) by mouth daily. 90 tablet 3   No current facility-administered medications for this visit.    Allergies  Allergen Reactions  . Zetia [Ezetimibe] Other (See Comments)    myalgias    History   Social History  . Marital Status: Married    Spouse Name: N/A  . Number of Children: N/A  . Years of Education: N/A   Occupational History  . Not on file.   Social History Main Topics  . Smoking status: Former Smoker -- 2.00  packs/day for 10 years    Types: Cigarettes    Quit date: 09/20/1981  . Smokeless tobacco: Never Used  . Alcohol Use: No     Comment: pt states he has stopped drinking alcohol  . Drug Use: No  . Sexual Activity: Not on file   Other Topics Concern  . Not on file   Social History Narrative    Family History  Problem Relation Age of Onset  . Heart attack Mother   . Heart disease Mother   . Heart failure Father   . Heart disease Father     Review of Systems:  As stated in the HPI and otherwise negative.   BP 116/60 mmHg  Pulse 48  Ht 5\' 9"  (1.753 m)  Wt 206 lb 1.9 oz (93.495 kg)  BMI 30.42 kg/m2  Physical Examination: General: Well developed, well nourished, NAD HEENT: OP  clear, mucus membranes moist SKIN: warm, dry. No rashes. Neuro: No focal deficits Musculoskeletal: Muscle strength 5/5 all ext Psychiatric: Mood and affect normal Neck: No JVD, no carotid bruits, no thyromegaly, no lymphadenopathy. Lungs:Clear bilaterally, no wheezes, rhonci, crackles Cardiovascular: Regular rate and rhythm. No murmurs, gallops or rubs. Abdomen:Soft. Bowel sounds present. Non-tender.  Extremities: No lower extremity edema. Pulses are 2 + in the bilateral DP/PT.  Cardiac cath 11/24/13: Angiographic Findings: Left main: Diffuse proximal and mid 20-30% stenosis.  Left Anterior Descending Artery: Large caliber vessel that courses to the apex. The vessel is heavily calcified in the proximal and mid segments. The mid vessel has a 50% heavily calcified stenosis. The distal vessel has diffuse plaque. The first diagonal branch is very small in caliber with ostial 50% stenosis. The second diagonal branch is moderate in caliber with mild plaque.  Circumflex Artery: Large vessel with heavy calcification in the proximal segment. There is a 99% proximal stenosis just beyond the ostium. The first OM branch is patent with mild plaque. The continuation of the AV groove Circumflex has a 40% stenosis.   Right Coronary Artery: Large dominant vessel with diffuse 20% stenosis, moderate proximal and mid calcification.  Left Ventricular Angiogram: LVEF=55-60%.   EKG:  EKG is ordered today. The ekg ordered today demonstrates Sinus brady, rate 48 bpm.   Recent Labs: 10/17/2014: ALT 21; Magnesium 2.0; TSH 0.781 11/10/2014: BUN 16; Creatinine 0.81; Hemoglobin 16.6; Platelets 217; Potassium 3.9; Sodium 140   Lipid Panel    Component Value Date/Time   CHOL 146 10/17/2014 1232   TRIG 80 10/17/2014 1232   HDL 60 10/17/2014 1232   CHOLHDL 2.4 10/17/2014 1232   VLDL 16 10/17/2014 1232   LDLCALC 70 10/17/2014 1232     Wt Readings from Last 3 Encounters:  11/28/14 206 lb 1.9 oz (93.495 kg)  11/10/14 204 lb (92.534 kg)  10/17/14 208 lb 9.6 oz (94.62 kg)     Other studies Reviewed: Additional studies/ records that were reviewed today include:  Review of the above records demonstrates:    Assessment and Plan:   1. CAD: Stable. He has no chest pain. Will stop Plavix since he is one year out from stent and is now on Xarelto. Will add ASA 81 mg daily. Continue statin.   2. Atrial fibrillation, paroxysmal: Sinus today on Cardizem and Xarelto. Followed in atrial fib clinic.   3. HTN: BP controlled. No changes.   Current medicines are reviewed at length with the patient today.  The patient does not have concerns regarding medicines.  The following changes have been made:  no change  Labs/ tests ordered today include:   Orders Placed This Encounter  Procedures  . EKG 12-Lead     Disposition:   FU with me in 12  months  Signed, Lauree Chandler, MD 11/28/2014 11:48 AM    [facility] [address], [place], [place]  [address] Phone: [phone]; Fax: [phone]

## 2014-12-21 ENCOUNTER — Ambulatory Visit: Payer: Medicare Other | Admitting: Internal Medicine

## 2014-12-22 ENCOUNTER — Other Ambulatory Visit: Payer: Self-pay

## 2014-12-22 ENCOUNTER — Encounter: Payer: Self-pay | Admitting: Internal Medicine

## 2014-12-22 ENCOUNTER — Ambulatory Visit (INDEPENDENT_AMBULATORY_CARE_PROVIDER_SITE_OTHER): Payer: Medicare Other | Admitting: Internal Medicine

## 2014-12-22 VITALS — BP 112/62 | HR 54 | Ht 69.0 in | Wt 210.2 lb

## 2014-12-22 DIAGNOSIS — I10 Essential (primary) hypertension: Secondary | ICD-10-CM

## 2014-12-22 DIAGNOSIS — G4730 Sleep apnea, unspecified: Secondary | ICD-10-CM | POA: Diagnosis not present

## 2014-12-22 DIAGNOSIS — I480 Paroxysmal atrial fibrillation: Secondary | ICD-10-CM

## 2014-12-22 DIAGNOSIS — I2510 Atherosclerotic heart disease of native coronary artery without angina pectoris: Secondary | ICD-10-CM

## 2014-12-22 NOTE — Progress Notes (Signed)
Electrophysiology Office Note   Date:  12/22/2014   ID:  Damon [name], DOB 1946/04/10, MRN [id number]  PCP:  [name], MD  Cardiologist:  Dr Angelena Form Primary Electrophysiologist: [name], MD    Chief Complaint  Patient presents with  . Persistent AFIB     History of Present Illness: Damon [name] is a 69 y.o. male who presents today for electrophysiology evaluation.   He has had recurrent afib 1/16 and 4/16 both requiring cardioversion.  Prior to this, he had had no afib since his ablation in 2014.  During afib, he reports fatigue and decreased exercise tolerance.  Today, he denies symptoms of palpitations, chest pain, shortness of breath, orthopnea, PND, lower extremity edema, claudication, dizziness, presyncope, syncope, bleeding, or neurologic sequela. The patient is tolerating medications without difficulties and is otherwise without complaint today.    Past Medical History  Diagnosis Date  . Hyperlipidemia   . Mitral regurgitation   . Bladder cancer   . OSA on CPAP   . Hearing loss of both ears   . History of colon polyps     BENIGN  . PAD (peripheral artery disease)     ABI--  RIGHT SIDE NORMAL LEFT SIDE MODERATELY REDUCED W/ DISTAL LEFT SFA STENOSIS//  MILD CALDICATION  . S/P ablation of atrial fibrillation     07-27-2012  . Anticoagulant long-term use     XARELTO  . Hypertension   . PAF (paroxysmal atrial fibrillation) dx  2007 /   PREVIOUS CARDIOVERTED X3  LAST ONE 07-27-2012  POST  ABLATION  07-27-2012    PRIMARY CARDIOLOGIST-  DR [name] CARDIO--  DR [name]  . Pre-diabetes   . Vitamin D deficiency    Past Surgical History  Procedure Laterality Date  . Total hip arthroplasty  07/25/2011    Procedure: TOTAL HIP ARTHROPLASTY ANTERIOR APPROACH;  Surgeon: Mcarthur Rossetti;  Location: [facility];  Service: Orthopedics;  Laterality: Right;  . Tee without cardioversion  07/26/2012    Procedure: TRANSESOPHAGEAL ECHOCARDIOGRAM  (TEE);  Surgeon: [name], MD;  Location: [facility] ENDOSCOPY;  Service: Cardiovascular;  Laterality: N/A;  . Cardioversion  08/06/2012    Procedure: CARDIOVERSION;  Surgeon: [name], MD;  Location: [place];  Service: Cardiovascular;  Laterality: N/A;  . Total hip arthroplasty Left 02-08-2010  . Cataract extraction w/ intraocular lens  implant, bilateral    . Cardiac electrophysiology study and ablation  07-27-2012  DR Dnya Hickle    SUCCESSFUL ABLATION OF A-FIB  . Tonsillectomy  age 89  . Cystoscopy w/ retrogrades Bilateral 04/12/2013    Procedure: CYSTOSCOPY WITH RETROGRADE PYELOGRAM;  Surgeon: [name], MD;  Location: [facility];  Service: Urology;  Laterality: Bilateral;  . Transurethral resection of bladder tumor with gyrus (turbt-gyrus) N/A 04/12/2013    Procedure: TRANSURETHRAL RESECTION OF BLADDER TUMOR WITH GYRUS (TURBT-GYRUS);  Surgeon: [name], MD;  Location: [facility];  Service: Urology;  Laterality: N/A;  . Atrial fibrillation ablation N/A 07/27/2012    Procedure: ATRIAL FIBRILLATION ABLATION;  Surgeon: [name], MD;  Location: [facility] CATH LAB;  Service: Cardiovascular;  Laterality: N/A;  . Left heart catheterization with coronary angiogram N/A 11/24/2013    Procedure: LEFT HEART CATHETERIZATION WITH CORONARY ANGIOGRAM;  Surgeon: [name], MD;  Location: [facility] CATH LAB;  Service: Cardiovascular;  Laterality: N/A;  . Percutaneous coronary stent intervention (pci-s)  11/24/2013    Procedure: PERCUTANEOUS CORONARY STENT INTERVENTION (PCI-S);  Surgeon: Annita Brod  Angelena Form, MD;  Location: [place] CATH LAB;  Service: Cardiovascular;;  . Percutaneous coronary rotoblator intervention (pci-r) N/A 11/28/2013    Procedure: PERCUTANEOUS CORONARY ROTOBLATOR INTERVENTION (PCI-R);  Surgeon: [name], MD;  Location: [facility] CATH LAB;  Service: Cardiovascular;  Laterality: N/A;  . Tee without cardioversion N/A 08/25/2014    Procedure:  TRANSESOPHAGEAL ECHOCARDIOGRAM (TEE);  Surgeon: Larey Dresser, MD;  Location: [place];  Service: Cardiovascular;  Laterality: N/A;  . Cardioversion N/A 08/25/2014    Procedure: CARDIOVERSION;  Surgeon: Larey Dresser, MD;  Location: [place];  Service: Cardiovascular;  Laterality: N/A;  . Cardioversion N/A 11/13/2014    Procedure: CARDIOVERSION;  Surgeon: Sueanne Margarita, MD;  Location: [place] ENDOSCOPY;  Service: Cardiovascular;  Laterality: N/A;     Current Outpatient Prescriptions  Medication Sig Dispense Refill  . Ascorbic Acid (VITAMIN C) 1000 MG tablet Take 1,000 mg by mouth daily as needed (vitamin supplement).     Marland Kitchen aspirin EC 81 MG tablet Take 1 tablet (81 mg total) by mouth daily. 90 tablet 3  . bisoprolol-hydrochlorothiazide (ZIAC) 10-6.25 MG per tablet Take 1 tablet by mouth daily. 90 tablet 3  . cholecalciferol (VITAMIN D) 1000 UNITS tablet Take 5,000 Units by mouth daily.     Marland Kitchen diltiazem (CARDIZEM CD) 120 MG 24 hr capsule Take 1 capsule (120 mg total) by mouth daily. 30 capsule 1  . Multiple Vitamin (MULITIVITAMIN WITH MINERALS) TABS Take 1 tablet by mouth daily. FOR MEN    . nitroGLYCERIN (NITROSTAT) 0.4 MG SL tablet Place 1 tablet (0.4 mg total) under the tongue every 5 (five) minutes as needed for chest pain. 25 tablet 2  . rivaroxaban (XARELTO) 20 MG TABS tablet Take 1 tablet (20 mg total) by mouth daily with supper. 30 tablet 11  . rosuvastatin (CRESTOR) 40 MG tablet Take 1 tablet (40 mg total) by mouth daily. 30 tablet 0   No current facility-administered medications for this visit.    Allergies:   Zetia   Social History:  The patient  reports that he quit smoking about 33 years ago. His smoking use included Cigarettes. He has a 20 pack-year smoking history. He has never used smokeless tobacco. He reports that he does not drink alcohol or use illicit drugs.   Family History:  The patient's  family history includes Heart attack in his mother; Heart disease in his  father and mother; Heart failure in his father.    ROS:  Please see the history of present illness.   All other systems are reviewed and negative.    PHYSICAL EXAM: VS:  BP 112/62 mmHg  Pulse 54  Ht 5\' 9"  (1.753 m)  Wt 95.346 kg (210 lb 3.2 oz)  BMI 31.03 kg/m2 , BMI Body mass index is 31.03 kg/(m^2). GEN: Well nourished, well developed, in no acute distress HEENT: normal Neck: no JVD, carotid bruits, or masses Cardiac: RRR; no murmurs, rubs, or gallops,no edema  Respiratory:  clear to auscultation bilaterally, normal work of breathing GI: soft, nontender, nondistended, + BS MS: no deformity or atrophy Skin: warm and dry  Neuro:  Strength and sensation are intact Psych: euthymic mood, full affect  EKG:  EKG is ordered today. The ekg ordered today shows sinus rhythm 54 bpm, otherwise normal ekg  Recent Labs: 10/17/2014: ALT 21; Magnesium 2.0; TSH 0.781 11/10/2014: BUN 16; Creatinine 0.81; Hemoglobin 16.6; Platelets 217; Potassium 3.9; Sodium 140    Lipid Panel     Component Value Date/Time   CHOL 146  10/17/2014 1232   TRIG 80 10/17/2014 1232   HDL 60 10/17/2014 1232   CHOLHDL 2.4 10/17/2014 1232   VLDL 16 10/17/2014 1232   LDLCALC 70 10/17/2014 1232     Wt Readings from Last 3 Encounters:  12/22/14 95.346 kg (210 lb 3.2 oz)  11/28/14 93.495 kg (206 lb 1.9 oz)  11/10/14 92.534 kg (204 lb)     Other studies Reviewed: Additional studies/ records that were reviewed today include: Roderic Palau NPs notes are reviewed   ASSESSMENT AND PLAN:  1.  Persistent atrial fibrillation Recurrent afib post ablation.  Therapeutic strategies for afib including medicine and repeat ablation were discussed in detail with the patient today. Risk, benefits, and alternatives to EP study and radiofrequency ablation were also discussed in detail today. These risks include but are not limited to stroke, bleeding, vascular damage, tamponade, perforation, damage to the esophagus, lungs, and  other structures, pulmonary vein stenosis, worsening renal function, and death. The patient understands these risk and wishes to proceed.  We will therefore proceed with catheter at the next available time. Continue anticoagulation  2. CAD Doing well Dr Angelena Form has recently stopped plavix.  Now on asa and xarelto  3. OSA He reports compliance with CPAP but wonders about its effectiveness.  Does not have anyone actively following his CPAP.  Requests referral to Dr Maxwell Caul for sleep management.  4. HTN Stable No change required today   Current medicines are reviewed at length with the patient today.   The patient does not have concerns regarding his medicines.  The following changes were made today:  none  Labs/ tests ordered today include:  Orders Placed This Encounter  Procedures  . Basic metabolic panel  . CBC with Differential/Platelet  . Ambulatory referral to Internal Medicine  . EKG 12-Lead    Signed, [name], MD  12/22/2014 2:13 PM     [place] [address] [phone] (office) [phone] (fax)

## 2014-12-22 NOTE — Patient Instructions (Addendum)
Medication Instructions: - no changes  Labwork: - none  Procedures/Testing: - Your physician has requested that you have a TEE. During a TEE, sound waves are used to create images of your heart. It provides your doctor with information about the size and shape of your heart and how well your heart's chambers and valves are working. In this test, a transducer is attached to the end of a flexible tube that's guided down your throat and into your esophagus (the tube leading from you mouth to your stomach) to get a more detailed image of your heart. You are not awake for the procedure. Please see the instruction sheet given to you today. For further information please visit [email or web address].  -Your physician has recommended that you have an ablation. Catheter ablation is a medical procedure used to treat some cardiac arrhythmias (irregular heartbeats). During catheter ablation, a long, thin, flexible tube is put into a blood vessel in your groin (upper thigh), or neck. This tube is called an ablation catheter. It is then guided to your heart through the blood vessel. Radio frequency waves destroy small areas of heart tissue where abnormal heartbeats may cause an arrhythmia to start. Please see the instruction sheet given to you today.  We will mail you a copy of all of your instructions with the dates and times of your procedures.  Follow-Up: - Your physician recommends that you schedule a follow-up appointment in: 4 weeks (from 01/26/15 - post procedure) with Roderic Palau, NP for Dr. [name]   - You have been referred to Dr. Nehemiah Settle [facility]) for evaluation of sleep apnea   Any Additional Special Instructions Will Be Listed Below (If Applicable). - none

## 2014-12-25 ENCOUNTER — Ambulatory Visit (HOSPITAL_COMMUNITY)
Admission: RE | Admit: 2014-12-25 | Discharge: 2014-12-25 | Disposition: A | Discharge status: Home / Self Care | Payer: Medicare Other | Source: Ambulatory Visit | Attending: Nurse Practitioner | Admitting: Nurse Practitioner

## 2014-12-25 ENCOUNTER — Encounter: Payer: Self-pay | Admitting: *Deleted

## 2014-12-25 ENCOUNTER — Encounter (HOSPITAL_COMMUNITY): Payer: Self-pay | Admitting: Nurse Practitioner

## 2014-12-25 VITALS — BP 110/70 | HR 78 | Ht 69.0 in | Wt 207.6 lb

## 2014-12-25 DIAGNOSIS — I4819 Other persistent atrial fibrillation: Secondary | ICD-10-CM

## 2014-12-25 DIAGNOSIS — I2510 Atherosclerotic heart disease of native coronary artery without angina pectoris: Secondary | ICD-10-CM | POA: Insufficient documentation

## 2014-12-25 DIAGNOSIS — Z87891 Personal history of nicotine dependence: Secondary | ICD-10-CM | POA: Insufficient documentation

## 2014-12-25 DIAGNOSIS — I10 Essential (primary) hypertension: Secondary | ICD-10-CM | POA: Insufficient documentation

## 2014-12-25 DIAGNOSIS — I481 Persistent atrial fibrillation: Secondary | ICD-10-CM | POA: Diagnosis not present

## 2014-12-25 LAB — BASIC METABOLIC PANEL
Anion gap: 6 (ref 5–15)
BUN: 16 mg/dL (ref 6–20)
CALCIUM: 9 mg/dL (ref 8.9–10.3)
CO2: 25 mmol/L (ref 22–32)
CREATININE: 0.88 mg/dL (ref 0.61–1.24)
Chloride: 110 mmol/L (ref 101–111)
GFR calc Af Amer: >60 mL/min (ref >60)
GLUCOSE: 140 mg/dL — AB (ref 65–99)
Potassium: 3.7 mmol/L (ref 3.5–5.1)
Sodium: 141 mmol/L (ref 135–145)

## 2014-12-25 LAB — CBC
HEMATOCRIT: 44.7 % (ref 39.0–52.0)
Hemoglobin: 15.4 g/dL (ref 13.0–17.0)
MCH: 29.8 pg (ref 26.0–34.0)
MCHC: 34.5 g/dL (ref 30.0–36.0)
MCV: 86.6 fL (ref 78.0–100.0)
PLATELETS: 208 10*3/uL (ref 150–400)
RBC: 5.16 MIL/uL (ref 4.22–5.81)
RDW: 12.8 % (ref 11.5–15.5)
WBC: 5.8 10*3/uL (ref 4.0–10.5)

## 2014-12-25 NOTE — Patient Instructions (Signed)
Cardioversion is scheduled for Thursday, June 9th @ [place] at the [facility] @ 730am  - Do not eat or drink anything after midnight  - Take your medications with a sip of water prior to arrival  - You will not be able to drive home after your procedure.

## 2014-12-25 NOTE — Progress Notes (Signed)
Patient ID: Damon [name], male   DOB: 26-Aug-1945, 69 y.o.   MRN: [id number]           PCP: [name], MD Primary Cardiologist: Linford Quintela is a 69 y.o. male who presents today for urgent evaluation in the afib clinic. H/o Afib ablation 1/14 and last DCCV 2/16 following an upper respiratory infection. Also h/o  PCI of the LCx with a Resolute Integrity DES 11/2013. Is now off plavix recently due to therapy x one year. He is on concomitant therapy with asa and xarelto for a chadsvasc score of 3.No bleeding issues.  He had successful DCCV 08/25/14 and 11/13/14 but asked to be seen today due to returning to Afib since Saturday. He has persistent afib and requires DCCV to return to SR. V rates are controlled but he does not feel well in afib and is requesting a repeat DCCV to hopefully hold him in SR until he has repeat ablation scheduled early July. No identifiable triggers. No alcohol, using cpap consistently. Marland KitchenNo missed doses of xarelto.   Today, he denies symptoms of chest pain, shortness of breath,  lower extremity edema, dizziness, presyncope, or syncope. Positive for fatigue.The patient is otherwise without complaint today.   Past Medical History  Diagnosis Date  . Hyperlipidemia   . Mitral regurgitation   . Bladder cancer   . OSA on CPAP   . Hearing loss of both ears   . History of colon polyps     BENIGN  . PAD (peripheral artery disease)     ABI--  RIGHT SIDE NORMAL LEFT SIDE MODERATELY REDUCED W/ DISTAL LEFT SFA STENOSIS//  MILD CALDICATION  . S/P ablation of atrial fibrillation     07-27-2012  . Anticoagulant long-term use     XARELTO  . Hypertension   . PAF (paroxysmal atrial fibrillation) dx  2007 /   PREVIOUS CARDIOVERTED X3  LAST ONE 07-27-2012  POST  ABLATION  07-27-2012    PRIMARY CARDIOLOGIST-  DR [name] CARDIO--  DR [name]  . Pre-diabetes   . Vitamin D deficiency    Past Surgical History  Procedure Laterality Date  . Total  hip arthroplasty  07/25/2011    Procedure: TOTAL HIP ARTHROPLASTY ANTERIOR APPROACH;  Surgeon: Mcarthur Rossetti;  Location: [facility];  Service: Orthopedics;  Laterality: Right;  . Tee without cardioversion  07/26/2012    Procedure: TRANSESOPHAGEAL ECHOCARDIOGRAM (TEE);  Surgeon: [name], MD;  Location: [facility] ENDOSCOPY;  Service: Cardiovascular;  Laterality: N/A;  . Cardioversion  08/06/2012    Procedure: CARDIOVERSION;  Surgeon: [name], MD;  Location: [place];  Service: Cardiovascular;  Laterality: N/A;  . Total hip arthroplasty Left 02-08-2010  . Cataract extraction w/ intraocular lens  implant, bilateral    . Cardiac electrophysiology study and ablation  07-27-2012  DR ALLRED    SUCCESSFUL ABLATION OF A-FIB  . Tonsillectomy  age 82  . Cystoscopy w/ retrogrades Bilateral 04/12/2013    Procedure: CYSTOSCOPY WITH RETROGRADE PYELOGRAM;  Surgeon: [name], MD;  Location: [facility];  Service: Urology;  Laterality: Bilateral;  . Transurethral resection of bladder tumor with gyrus (turbt-gyrus) N/A 04/12/2013    Procedure: TRANSURETHRAL RESECTION OF BLADDER TUMOR WITH GYRUS (TURBT-GYRUS);  Surgeon: [name], MD;  Location: [facility];  Service: Urology;  Laterality: N/A;  . Atrial fibrillation ablation N/A 07/27/2012    Procedure: ATRIAL FIBRILLATION ABLATION;  Surgeon: [name], MD;  Location: [facility] CATH LAB;  Service: Cardiovascular;  Laterality: N/A;  . Left heart catheterization with coronary angiogram N/A 11/24/2013    Procedure: LEFT HEART CATHETERIZATION WITH CORONARY ANGIOGRAM;  Surgeon: [name], MD;  Location: [facility] CATH LAB;  Service: Cardiovascular;  Laterality: N/A;  . Percutaneous coronary stent intervention (pci-s)  11/24/2013    Procedure: PERCUTANEOUS CORONARY STENT INTERVENTION (PCI-S);  Surgeon: [name], MD;  Location: [facility] CATH LAB;  Service: Cardiovascular;;  . Percutaneous coronary rotoblator intervention  (pci-r) N/A 11/28/2013    Procedure: PERCUTANEOUS CORONARY ROTOBLATOR INTERVENTION (PCI-R);  Surgeon: [name], MD;  Location: [facility] CATH LAB;  Service: Cardiovascular;  Laterality: N/A;  . Tee without cardioversion N/A 08/25/2014    Procedure: TRANSESOPHAGEAL ECHOCARDIOGRAM (TEE);  Surgeon: Larey Dresser, MD;  Location: [place];  Service: Cardiovascular;  Laterality: N/A;  . Cardioversion N/A 08/25/2014    Procedure: CARDIOVERSION;  Surgeon: Larey Dresser, MD;  Location: [place];  Service: Cardiovascular;  Laterality: N/A;  . Cardioversion N/A 11/13/2014    Procedure: CARDIOVERSION;  Surgeon: Sueanne Margarita, MD;  Location: [place] ENDOSCOPY;  Service: Cardiovascular;  Laterality: N/A;    Current Outpatient Prescriptions  Medication Sig Dispense Refill  . Ascorbic Acid (VITAMIN C) 1000 MG tablet Take 1,000 mg by mouth daily as needed (vitamin supplement).     Marland Kitchen aspirin EC 81 MG tablet Take 1 tablet (81 mg total) by mouth daily. 90 tablet 3  . bisoprolol-hydrochlorothiazide (ZIAC) 10-6.25 MG per tablet Take 1 tablet by mouth daily. 90 tablet 3  . cholecalciferol (VITAMIN D) 1000 UNITS tablet Take 5,000 Units by mouth daily.     Marland Kitchen diltiazem (CARDIZEM CD) 120 MG 24 hr capsule Take 1 capsule (120 mg total) by mouth daily. 30 capsule 1  . Multiple Vitamin (MULITIVITAMIN WITH MINERALS) TABS Take 1 tablet by mouth daily. FOR MEN    . nitroGLYCERIN (NITROSTAT) 0.4 MG SL tablet Place 1 tablet (0.4 mg total) under the tongue every 5 (five) minutes as needed for chest pain. 25 tablet 2  . rivaroxaban (XARELTO) 20 MG TABS tablet Take 1 tablet (20 mg total) by mouth daily with supper. 30 tablet 11  . rosuvastatin (CRESTOR) 40 MG tablet Take 1 tablet (40 mg total) by mouth daily. 30 tablet 0   No current facility-administered medications for this encounter.    Physical Exam: Filed Vitals:   12/25/14 1109  BP: 110/70  Pulse: 78  Height: 5\' 9"  (1.753 m)  Weight: 207 lb 9.6 oz (94.167 kg)     GEN- The patient is well appearing, alert and oriented x 3 today.   Head- normocephalic, atraumatic Eyes-  Sclera clear, conjunctiva pink Ears- hearing intact Oropharynx- clear Lungs- Clear to ausculation bilaterally, normal work of breathing Heart- Irregular rate and rhythm, no murmurs, rubs or gallops, PMI not laterally displaced GI- soft, NT, ND, + BS Extremities- no clubbing, cyanosis, or edema  ekg today reveals afib at 78bpm, . Epic notes reviewed.  Assessment and Plan:  1.Persitent afib Requires DCCV to restore rhythm. Will schedule  DCCV for Thursday, no missed doses of xarelto. Chads2vasc score of 3(HTN, AGE, CAD) Continue on Cardizem 120 mg a day.  CBC,BMET today  2.  HTN Stable No change required today  3. CAD Stable No change required today Now off plavix, continue asa.   Follow-up with Dr. [name] in July as scheduled for redo abaltion.

## 2014-12-28 ENCOUNTER — Encounter (HOSPITAL_COMMUNITY): Payer: Self-pay | Admitting: *Deleted

## 2014-12-28 ENCOUNTER — Ambulatory Visit (HOSPITAL_COMMUNITY): Payer: Medicare Other | Admitting: Anesthesiology

## 2014-12-28 ENCOUNTER — Encounter (HOSPITAL_COMMUNITY)
Admission: RE | Disposition: A | Discharge status: Home / Self Care | Payer: Self-pay | Source: Ambulatory Visit | Attending: Internal Medicine

## 2014-12-28 ENCOUNTER — Ambulatory Visit (HOSPITAL_COMMUNITY)
Admission: RE | Admit: 2014-12-28 | Discharge: 2014-12-28 | Disposition: A | Discharge status: Home / Self Care | Payer: Medicare Other | Source: Ambulatory Visit | Attending: Internal Medicine | Admitting: Internal Medicine

## 2014-12-28 DIAGNOSIS — I481 Persistent atrial fibrillation: Secondary | ICD-10-CM | POA: Diagnosis not present

## 2014-12-28 DIAGNOSIS — I4891 Unspecified atrial fibrillation: Secondary | ICD-10-CM | POA: Diagnosis present

## 2014-12-28 HISTORY — PX: CARDIOVERSION: SHX1299

## 2014-12-28 SURGERY — CARDIOVERSION
Anesthesia: General

## 2014-12-28 MED ORDER — PROPOFOL 10 MG/ML IV BOLUS
INTRAVENOUS | Status: DC | PRN
  Administered 2014-12-28: 100 mg via INTRAVENOUS

## 2014-12-28 MED ORDER — SODIUM CHLORIDE 0.9 % IV SOLN
INTRAVENOUS | Status: DC | PRN
  Administered 2014-12-28: 10:00:00 via INTRAVENOUS

## 2014-12-28 MED ORDER — LIDOCAINE HCL (CARDIAC) 20 MG/ML IV SOLN
INTRAVENOUS | Status: DC | PRN
  Administered 2014-12-28: 50 mg via INTRAVENOUS

## 2014-12-28 NOTE — Anesthesia Preprocedure Evaluation (Addendum)
Anesthesia Evaluation  Patient identified by MRN, date of birth, ID band Patient awake    Reviewed: Allergy & Precautions, H&P , NPO status , Patient's Chart, lab work & pertinent test results, reviewed documented beta blocker date and time   Airway Mallampati: III  TM Distance: >3 FB Neck ROM: Full    Dental no notable dental hx. (+) Teeth Intact, Dental Advisory Given   Pulmonary sleep apnea and Continuous Positive Airway Pressure Ventilation , former smoker,  breath sounds clear to auscultation  Pulmonary exam normal       Cardiovascular hypertension, Pt. on medications and Pt. on home beta blockers + CAD and + Peripheral Vascular Disease + dysrhythmias Atrial Fibrillation Rhythm:Irregular Rate:Normal     Neuro/Psych negative neurological ROS  negative psych ROS   GI/Hepatic negative GI ROS, Neg liver ROS,   Endo/Other  negative endocrine ROS  Renal/GU negative Renal ROS  negative genitourinary   Musculoskeletal  (+) Arthritis -, Osteoarthritis,    Abdominal   Peds  Hematology negative hematology ROS (+)   Anesthesia Other Findings   Reproductive/Obstetrics negative OB ROS                            Anesthesia Physical Anesthesia Plan  ASA: III  Anesthesia Plan: General   Post-op Pain Management:    Induction: Intravenous  Airway Management Planned: Mask  Additional Equipment:   Intra-op Plan:   Post-operative Plan:   Informed Consent: I have reviewed the patients History and Physical, chart, labs and discussed the procedure including the risks, benefits and alternatives for the proposed anesthesia with the patient or authorized representative who has indicated his/her understanding and acceptance.   Dental advisory given  Plan Discussed with: CRNA  Anesthesia Plan Comments:         Anesthesia Quick Evaluation

## 2014-12-28 NOTE — Op Note (Signed)
Patient anesthetized with propofol IV With pads in AP position patient cardioverted to SR with 150 J synchronized biphasic energy 12 lead EKG pending   Procedure without complications.

## 2014-12-28 NOTE — Transfer of Care (Signed)
Immediate Anesthesia Transfer of Care Note  Patient: Damon [name]  Procedure(s) Performed: Procedure(s): CARDIOVERSION (N/A)  Patient Location: Endoscopy Unit  Anesthesia Type:MAC  Level of Consciousness: awake and responds to stimulation  Airway & Oxygen Therapy: Patient Spontanous Breathing  Post-op Assessment: Report given to RN and Post -op Vital signs reviewed and stable  Post vital signs: Reviewed and stable  Last Vitals:  Filed Vitals:   12/28/14 1005  BP: 120/76  Pulse:   Temp:   Resp: 14    Complications: No apparent anesthesia complications

## 2014-12-28 NOTE — Anesthesia Postprocedure Evaluation (Signed)
  Anesthesia Post-op Note  Patient: Damon [name]  Procedure(s) Performed: Procedure(s): CARDIOVERSION (N/A)  Patient Location: PACU  Anesthesia Type:General  Level of Consciousness: awake and alert   Airway and Oxygen Therapy: Patient Spontanous Breathing  Post-op Pain: none  Post-op Assessment: Post-op Vital signs reviewed              Post-op Vital Signs: Reviewed  Last Vitals:  Filed Vitals:   12/28/14 1050  BP: 114/71  Pulse: 55  Temp:   Resp: 17    Complications: No apparent anesthesia complications

## 2014-12-28 NOTE — Discharge Instructions (Signed)

## 2014-12-29 ENCOUNTER — Encounter (HOSPITAL_COMMUNITY): Payer: Self-pay | Admitting: Internal Medicine

## 2015-01-03 ENCOUNTER — Telehealth: Payer: Self-pay | Admitting: Internal Medicine

## 2015-01-03 ENCOUNTER — Encounter: Payer: Self-pay | Admitting: Internal Medicine

## 2015-01-03 NOTE — Telephone Encounter (Signed)
ERROR

## 2015-01-04 ENCOUNTER — Ambulatory Visit (HOSPITAL_COMMUNITY)
Admission: RE | Admit: 2015-01-04 | Discharge: 2015-01-04 | Disposition: A | Discharge status: Home / Self Care | Payer: Medicare Other | Source: Ambulatory Visit | Attending: Nurse Practitioner | Admitting: Nurse Practitioner

## 2015-01-04 ENCOUNTER — Encounter (HOSPITAL_COMMUNITY): Payer: Self-pay | Admitting: Nurse Practitioner

## 2015-01-04 VITALS — BP 130/68 | HR 48 | Ht 69.0 in | Wt 203.6 lb

## 2015-01-04 DIAGNOSIS — I4891 Unspecified atrial fibrillation: Secondary | ICD-10-CM | POA: Diagnosis present

## 2015-01-04 DIAGNOSIS — I481 Persistent atrial fibrillation: Secondary | ICD-10-CM | POA: Diagnosis not present

## 2015-01-04 DIAGNOSIS — I4819 Other persistent atrial fibrillation: Secondary | ICD-10-CM

## 2015-01-04 DIAGNOSIS — R001 Bradycardia, unspecified: Secondary | ICD-10-CM | POA: Diagnosis not present

## 2015-01-04 NOTE — Progress Notes (Signed)
Patient ID: Damon [name], male   DOB: [birth year], 69 y.o.   MRN: [id number]     Primary Care Physician: [name], MD Referring Physician:   [name] is a 69 y.o. male with a h/o persistent afib for evaluation today in the afib clinic s/p cardioversion 12/28/14. He had an successful cardioversion and is maintaining sinus rhythm. He does have sinus brady today at 48 bpm but is asymptomatic . He is pending a redo ablation 7/8 with Dr. [name].  Today, he denies symptoms of palpitations, chest pain, shortness of breath, orthopnea, PND, lower extremity edema, dizziness, presyncope, syncope, or neurologic sequela. The patient is tolerating medications without difficulties and is otherwise without complaint today.   Past Medical History  Diagnosis Date  . Hyperlipidemia   . Mitral regurgitation   . Bladder cancer   . OSA on CPAP   . Hearing loss of both ears   . History of colon polyps     BENIGN  . PAD (peripheral artery disease)     ABI--  RIGHT SIDE NORMAL LEFT SIDE MODERATELY REDUCED W/ DISTAL LEFT SFA STENOSIS//  MILD CALDICATION  . S/P ablation of atrial fibrillation     07-27-2012  . Anticoagulant long-term use     XARELTO  . Hypertension   . PAF (paroxysmal atrial fibrillation) dx  2007 /   PREVIOUS CARDIOVERTED X3  LAST ONE 07-27-2012  POST  ABLATION  07-27-2012    PRIMARY CARDIOLOGIST-  DR [name] CARDIO--  DR [name]  . Pre-diabetes   . Vitamin D deficiency    Past Surgical History  Procedure Laterality Date  . Total hip arthroplasty  07/25/2011    Procedure: TOTAL HIP ARTHROPLASTY ANTERIOR APPROACH;  Surgeon: Mcarthur Rossetti;  Location: [facility];  Service: Orthopedics;  Laterality: Right;  . Tee without cardioversion  07/26/2012    Procedure: TRANSESOPHAGEAL ECHOCARDIOGRAM (TEE);  Surgeon: [name], MD;  Location: [facility] ENDOSCOPY;  Service: Cardiovascular;  Laterality: N/A;  . Cardioversion  08/06/2012    Procedure: CARDIOVERSION;   Surgeon: [name], MD;  Location: [place];  Service: Cardiovascular;  Laterality: N/A;  . Total hip arthroplasty Left 02-08-2010  . Cataract extraction w/ intraocular lens  implant, bilateral    . Cardiac electrophysiology study and ablation  07-27-2012  DR ALLRED    SUCCESSFUL ABLATION OF A-FIB  . Tonsillectomy  age 28  . Cystoscopy w/ retrogrades Bilateral 04/12/2013    Procedure: CYSTOSCOPY WITH RETROGRADE PYELOGRAM;  Surgeon: [name], MD;  Location: [facility];  Service: Urology;  Laterality: Bilateral;  . Transurethral resection of bladder tumor with gyrus (turbt-gyrus) N/A 04/12/2013    Procedure: TRANSURETHRAL RESECTION OF BLADDER TUMOR WITH GYRUS (TURBT-GYRUS);  Surgeon: [name], MD;  Location: [facility];  Service: Urology;  Laterality: N/A;  . Atrial fibrillation ablation N/A 07/27/2012    Procedure: ATRIAL FIBRILLATION ABLATION;  Surgeon: [name], MD;  Location: [facility] CATH LAB;  Service: Cardiovascular;  Laterality: N/A;  . Left heart catheterization with coronary angiogram N/A 11/24/2013    Procedure: LEFT HEART CATHETERIZATION WITH CORONARY ANGIOGRAM;  Surgeon: [name], MD;  Location: [facility] CATH LAB;  Service: Cardiovascular;  Laterality: N/A;  . Percutaneous coronary stent intervention (pci-s)  11/24/2013    Procedure: PERCUTANEOUS CORONARY STENT INTERVENTION (PCI-S);  Surgeon: [name], MD;  Location: [facility] CATH LAB;  Service: Cardiovascular;;  . Percutaneous coronary rotoblator intervention (pci-r) N/A 11/28/2013    Procedure: PERCUTANEOUS CORONARY  ROTOBLATOR INTERVENTION (PCI-R);  Surgeon: [name], MD;  Location: [facility] CATH LAB;  Service: Cardiovascular;  Laterality: N/A;  . Tee without cardioversion N/A 08/25/2014    Procedure: TRANSESOPHAGEAL ECHOCARDIOGRAM (TEE);  Surgeon: Larey Dresser, MD;  Location: [place];  Service: Cardiovascular;  Laterality: N/A;  . Cardioversion N/A 08/25/2014     Procedure: CARDIOVERSION;  Surgeon: Larey Dresser, MD;  Location: [place];  Service: Cardiovascular;  Laterality: N/A;  . Cardioversion N/A 11/13/2014    Procedure: CARDIOVERSION;  Surgeon: Sueanne Margarita, MD;  Location: [place];  Service: Cardiovascular;  Laterality: N/A;  . Cardioversion N/A 12/28/2014    Procedure: CARDIOVERSION;  Surgeon: [name], MD;  Location: [facility] ENDOSCOPY;  Service: Cardiovascular;  Laterality: N/A;    Current Outpatient Prescriptions  Medication Sig Dispense Refill  . Ascorbic Acid (VITAMIN C) 1000 MG tablet Take 1,000 mg by mouth daily as needed (vitamin supplement).     Marland Kitchen aspirin EC 81 MG tablet Take 1 tablet (81 mg total) by mouth daily. 90 tablet 3  . bisoprolol-hydrochlorothiazide (ZIAC) 10-6.25 MG per tablet Take 1 tablet by mouth daily. 90 tablet 3  . cholecalciferol (VITAMIN D) 1000 UNITS tablet Take 5,000 Units by mouth daily.     Marland Kitchen diltiazem (CARDIZEM CD) 120 MG 24 hr capsule Take 1 capsule (120 mg total) by mouth daily. 30 capsule 1  . Multiple Vitamin (MULITIVITAMIN WITH MINERALS) TABS Take 1 tablet by mouth daily. FOR MEN    . nitroGLYCERIN (NITROSTAT) 0.4 MG SL tablet Place 1 tablet (0.4 mg total) under the tongue every 5 (five) minutes as needed for chest pain. 25 tablet 2  . rivaroxaban (XARELTO) 20 MG TABS tablet Take 1 tablet (20 mg total) by mouth daily with supper. 30 tablet 11  . rosuvastatin (CRESTOR) 40 MG tablet Take 1 tablet (40 mg total) by mouth daily. (Patient taking differently: Take 20 mg by mouth daily. ) 30 tablet 0   No current facility-administered medications for this encounter.    Allergies  Allergen Reactions  . Zetia [Ezetimibe] Other (See Comments)    myalgias    History   Social History  . Marital Status: Married    Spouse Name: N/A  . Number of Children: N/A  . Years of Education: N/A   Occupational History  . Not on file.   Social History Main Topics  . Smoking status: Former Smoker -- 2.00  packs/day for 10 years    Types: Cigarettes    Quit date: 09/20/1981  . Smokeless tobacco: Never Used  . Alcohol Use: No     Comment: pt states he has stopped drinking alcohol  . Drug Use: No  . Sexual Activity: Not on file   Other Topics Concern  . Not on file   Social History Narrative    Family History  Problem Relation Age of Onset  . Heart attack Mother   . Heart disease Mother   . Heart failure Father   . Heart disease Father     ROS- All systems are reviewed and negative except as per the HPI above  Physical Exam: Filed Vitals:   01/04/15 1102  BP: 130/68  Pulse: 48  Height: 5\' 9"  (1.753 m)  Weight: 203 lb 9.6 oz (92.352 kg)    GEN- The patient is well appearing, alert and oriented x 3 today.   Head- normocephalic, atraumatic Eyes-  Sclera clear, conjunctiva pink Ears- hearing intact Oropharynx- clear Neck- supple, no JVP Lymph- no cervical  lymphadenopathy Lungs- Clear to ausculation bilaterally, normal work of breathing Heart- Regular rate and rhythm, no murmurs, rubs or gallops, PMI not laterally displaced GI- soft, NT, ND, + BS Extremities- no clubbing, cyanosis, or edema MS- no significant deformity or atrophy Skin- no rash or lesion Psych- euthymic mood, full affect Neuro- strength and sensation are intact  EKG- Sinus brady at 48 bpm, otherwise normal EKG.  Assessment and Plan:  1. Persistent afib Maintaining SR after successful cardioversion Scheduled for redo ablation 7/8.  2. Bradycardia Asymptomatic  3. HTN Stable  AFib clinic as needed

## 2015-01-19 ENCOUNTER — Other Ambulatory Visit: Payer: Medicare Other

## 2015-01-23 ENCOUNTER — Other Ambulatory Visit (INDEPENDENT_AMBULATORY_CARE_PROVIDER_SITE_OTHER): Payer: Medicare Other | Admitting: *Deleted

## 2015-01-23 DIAGNOSIS — I480 Paroxysmal atrial fibrillation: Secondary | ICD-10-CM | POA: Diagnosis not present

## 2015-01-23 LAB — BASIC METABOLIC PANEL
BUN: 23 mg/dL (ref 6–23)
CALCIUM: 9.5 mg/dL (ref 8.4–10.5)
CHLORIDE: 106 meq/L (ref 96–112)
CO2: 26 meq/L (ref 19–32)
CREATININE: 0.94 mg/dL (ref 0.40–1.50)
GFR: 84.4 mL/min (ref >60.00)
Glucose, Bld: 91 mg/dL (ref 70–99)
Potassium: 3.9 mEq/L (ref 3.5–5.1)
Sodium: 141 mEq/L (ref 135–145)

## 2015-01-23 LAB — CBC WITH DIFFERENTIAL/PLATELET
BASOS PCT: 0.4 % (ref 0.0–3.0)
Basophils Absolute: 0 10*3/uL (ref 0.0–0.1)
EOS PCT: 3.6 % (ref 0.0–5.0)
Eosinophils Absolute: 0.2 10*3/uL (ref 0.0–0.7)
HCT: 43.9 % (ref 39.0–52.0)
HEMOGLOBIN: 14.9 g/dL (ref 13.0–17.0)
Lymphocytes Relative: 22.8 % (ref 12.0–46.0)
Lymphs Abs: 1.3 10*3/uL (ref 0.7–4.0)
MCHC: 34.1 g/dL (ref 30.0–36.0)
MCV: 89.2 fl (ref 78.0–100.0)
MONOS PCT: 7.7 % (ref 3.0–12.0)
Monocytes Absolute: 0.4 10*3/uL (ref 0.1–1.0)
NEUTROS ABS: 3.8 10*3/uL (ref 1.4–7.7)
Neutrophils Relative %: 65.5 % (ref 43.0–77.0)
Platelets: 196 10*3/uL (ref 150.0–400.0)
RBC: 4.92 Mil/uL (ref 4.22–5.81)
RDW: 12.9 % (ref 11.5–15.5)
WBC: 5.8 10*3/uL (ref 4.0–10.5)

## 2015-01-26 ENCOUNTER — Encounter (HOSPITAL_COMMUNITY): Payer: Self-pay | Admitting: Certified Registered Nurse Anesthetist

## 2015-01-26 ENCOUNTER — Encounter (HOSPITAL_COMMUNITY)
Admission: RE | Disposition: A | Discharge status: Home / Self Care | Payer: Self-pay | Source: Ambulatory Visit | Attending: Internal Medicine

## 2015-01-26 ENCOUNTER — Ambulatory Visit (HOSPITAL_COMMUNITY): Payer: Medicare Other | Admitting: Anesthesiology

## 2015-01-26 ENCOUNTER — Ambulatory Visit (HOSPITAL_COMMUNITY)
Admission: RE | Admit: 2015-01-26 | Discharge: 2015-01-27 | Disposition: A | Discharge status: Home / Self Care | Payer: Medicare Other | Source: Ambulatory Visit | Attending: Internal Medicine | Admitting: Internal Medicine

## 2015-01-26 ENCOUNTER — Ambulatory Visit (HOSPITAL_BASED_OUTPATIENT_CLINIC_OR_DEPARTMENT_OTHER): Payer: Medicare Other

## 2015-01-26 DIAGNOSIS — Z87891 Personal history of nicotine dependence: Secondary | ICD-10-CM | POA: Diagnosis not present

## 2015-01-26 DIAGNOSIS — E785 Hyperlipidemia, unspecified: Secondary | ICD-10-CM | POA: Diagnosis not present

## 2015-01-26 DIAGNOSIS — Z96641 Presence of right artificial hip joint: Secondary | ICD-10-CM | POA: Diagnosis not present

## 2015-01-26 DIAGNOSIS — I340 Nonrheumatic mitral (valve) insufficiency: Secondary | ICD-10-CM | POA: Diagnosis not present

## 2015-01-26 DIAGNOSIS — Z7982 Long term (current) use of aspirin: Secondary | ICD-10-CM | POA: Insufficient documentation

## 2015-01-26 DIAGNOSIS — C679 Malignant neoplasm of bladder, unspecified: Secondary | ICD-10-CM | POA: Diagnosis not present

## 2015-01-26 DIAGNOSIS — I481 Persistent atrial fibrillation: Secondary | ICD-10-CM | POA: Insufficient documentation

## 2015-01-26 DIAGNOSIS — Z7901 Long term (current) use of anticoagulants: Secondary | ICD-10-CM | POA: Diagnosis not present

## 2015-01-26 DIAGNOSIS — G4733 Obstructive sleep apnea (adult) (pediatric): Secondary | ICD-10-CM | POA: Diagnosis not present

## 2015-01-26 DIAGNOSIS — I10 Essential (primary) hypertension: Secondary | ICD-10-CM | POA: Insufficient documentation

## 2015-01-26 DIAGNOSIS — I4892 Unspecified atrial flutter: Secondary | ICD-10-CM | POA: Diagnosis not present

## 2015-01-26 DIAGNOSIS — I4891 Unspecified atrial fibrillation: Secondary | ICD-10-CM | POA: Diagnosis not present

## 2015-01-26 HISTORY — PX: ELECTROPHYSIOLOGIC STUDY: SHX172A

## 2015-01-26 HISTORY — PX: TEE WITHOUT CARDIOVERSION: SHX5443

## 2015-01-26 HISTORY — PX: ABLATION OF DYSRHYTHMIC FOCUS: SHX254

## 2015-01-26 LAB — POCT ACTIVATED CLOTTING TIME
ACTIVATED CLOTTING TIME: 300 s
ACTIVATED CLOTTING TIME: 307 s
ACTIVATED CLOTTING TIME: 313 s
ACTIVATED CLOTTING TIME: 153 s
Activated Clotting Time: 325 seconds

## 2015-01-26 SURGERY — ATRIAL FIBRILLATION ABLATION
Anesthesia: General

## 2015-01-26 SURGERY — ECHOCARDIOGRAM, TRANSESOPHAGEAL
Anesthesia: Moderate Sedation

## 2015-01-26 MED ORDER — PROPOFOL 10 MG/ML IV BOLUS
INTRAVENOUS | Status: DC | PRN
  Administered 2015-01-26: 20 mg via INTRAVENOUS
  Administered 2015-01-26: 150 mg via INTRAVENOUS

## 2015-01-26 MED ORDER — HEPARIN SODIUM (PORCINE) 1000 UNIT/ML IJ SOLN
INTRAMUSCULAR | Status: DC | PRN
  Administered 2015-01-26: 12000 [IU] via INTRAVENOUS

## 2015-01-26 MED ORDER — IOHEXOL 350 MG/ML SOLN
INTRAVENOUS | Status: DC | PRN
  Administered 2015-01-26: 8 mL via INTRAVENOUS
  Administered 2015-01-26: 100 mL via INTRAVENOUS

## 2015-01-26 MED ORDER — SODIUM CHLORIDE 0.9 % IV SOLN
INTRAVENOUS | Status: DC
Start: 2015-01-26 — End: 2015-01-26
  Administered 2015-01-26: 07:00:00 via INTRAVENOUS

## 2015-01-26 MED ORDER — HEPARIN SODIUM (PORCINE) 1000 UNIT/ML IJ SOLN
INTRAMUSCULAR | Status: DC | PRN
  Administered 2015-01-26: 2000 [IU] via INTRAVENOUS
  Administered 2015-01-26: 1000 [IU] via INTRAVENOUS
  Administered 2015-01-26: 2000 [IU] via INTRAVENOUS

## 2015-01-26 MED ORDER — PROTAMINE SULFATE 10 MG/ML IV SOLN
INTRAVENOUS | Status: DC | PRN
  Administered 2015-01-26: 30 mg via INTRAVENOUS

## 2015-01-26 MED ORDER — HYDROCODONE-ACETAMINOPHEN 5-325 MG PO TABS: 1.0000 | ORAL_TABLET | ORAL | Status: DC | PRN

## 2015-01-26 MED ORDER — PANTOPRAZOLE SODIUM 40 MG PO TBEC: 40.0000 mg | DELAYED_RELEASE_TABLET | Freq: Every day | ORAL | Status: DC

## 2015-01-26 MED ORDER — BUPIVACAINE HCL (PF) 0.25 % IJ SOLN
INTRAMUSCULAR | Status: AC
  Filled 2015-01-26: qty 30

## 2015-01-26 MED ORDER — MIDAZOLAM HCL 5 MG/5ML IJ SOLN
INTRAMUSCULAR | Status: DC | PRN
  Administered 2015-01-26 (×2): 1 mg via INTRAVENOUS

## 2015-01-26 MED ORDER — DOBUTAMINE IN D5W 4-5 MG/ML-% IV SOLN
INTRAVENOUS | Status: AC
  Filled 2015-01-26: qty 250

## 2015-01-26 MED ORDER — RIVAROXABAN 20 MG PO TABS
20.0000 mg | ORAL_TABLET | Freq: Every day | ORAL | Status: DC
  Administered 2015-01-26: 20 mg via ORAL
  Filled 2015-01-26: qty 1

## 2015-01-26 MED ORDER — SODIUM CHLORIDE 0.9 % IJ SOLN: 3.0000 mL | Freq: Two times a day (BID) | INTRAMUSCULAR | Status: DC

## 2015-01-26 MED ORDER — FENTANYL CITRATE (PF) 100 MCG/2ML IJ SOLN: 25.0000 ug | INTRAMUSCULAR | Status: DC | PRN

## 2015-01-26 MED ORDER — SODIUM CHLORIDE 0.9 % IV SOLN: 250.0000 mL | INTRAVENOUS | Status: DC | PRN

## 2015-01-26 MED ORDER — ONDANSETRON HCL 4 MG/2ML IJ SOLN
INTRAMUSCULAR | Status: DC | PRN
  Administered 2015-01-26: 4 mg via INTRAVENOUS

## 2015-01-26 MED ORDER — SODIUM CHLORIDE 0.9 % IJ SOLN: 3.0000 mL | INTRAMUSCULAR | Status: DC | PRN

## 2015-01-26 MED ORDER — HEPARIN SODIUM (PORCINE) 1000 UNIT/ML IJ SOLN
INTRAMUSCULAR | Status: AC
  Filled 2015-01-26: qty 1

## 2015-01-26 MED ORDER — ACETAMINOPHEN 325 MG PO TABS: 650.0000 mg | ORAL_TABLET | ORAL | Status: DC | PRN

## 2015-01-26 MED ORDER — LIDOCAINE HCL (CARDIAC) 20 MG/ML IV SOLN
INTRAVENOUS | Status: DC | PRN
  Administered 2015-01-26: 20 mg via INTRAVENOUS
  Administered 2015-01-26: 50 mg via INTRAVENOUS

## 2015-01-26 MED ORDER — DOBUTAMINE IN D5W 4-5 MG/ML-% IV SOLN
INTRAVENOUS | Status: DC | PRN
  Administered 2015-01-26: 10 ug/kg/min via INTRAVENOUS

## 2015-01-26 MED ORDER — ONDANSETRON HCL 4 MG/2ML IJ SOLN: 4.0000 mg | Freq: Four times a day (QID) | INTRAMUSCULAR | Status: DC | PRN

## 2015-01-26 MED ORDER — PROPOFOL INFUSION 10 MG/ML OPTIME
INTRAVENOUS | Status: DC | PRN
  Administered 2015-01-26: 50 ug/kg/min via INTRAVENOUS

## 2015-01-26 MED ORDER — SODIUM CHLORIDE 0.9 % IV SOLN
INTRAVENOUS | Status: DC
  Administered 2015-01-26 (×2): via INTRAVENOUS

## 2015-01-26 MED ORDER — FENTANYL CITRATE (PF) 100 MCG/2ML IJ SOLN
INTRAMUSCULAR | Status: DC | PRN
  Administered 2015-01-26 (×8): 25 ug via INTRAVENOUS

## 2015-01-26 SURGICAL SUPPLY — 24 items
BAG SNAP BAND KOVER 36X36 (MISCELLANEOUS) ×4 IMPLANT
BLANKET WARM UNDERBOD FULL ACC (MISCELLANEOUS) ×4 IMPLANT
CATH DIAG 6FR PIGTAIL (CATHETERS) ×4 IMPLANT
CATH NAVISTAR SMARTTOUCH DF (ABLATOR) ×4 IMPLANT
CATH SOUNDSTAR 3D IMAGING (CATHETERS) ×4 IMPLANT
CATH VARIABLE LASSO NAV 2515 (CATHETERS) ×4 IMPLANT
CATH WEBSTER BI DIR CS D-F CRV (CATHETERS) ×4 IMPLANT
COVER SWIFTLINK CONNECTOR (BAG) ×4 IMPLANT
NDL TRANSSPETAL BRK-XS 71CM (NEEDLE) ×2 IMPLANT
NEEDLE TRANSEP BRK 71CM 407200 (NEEDLE) ×4 IMPLANT
NEEDLE TRANSSPETAL BRK-XS 71CM (NEEDLE) ×2
PACK EP LATEX FREE (CUSTOM PROCEDURE TRAY) ×2
PACK EP LF (CUSTOM PROCEDURE TRAY) ×2 IMPLANT
PAD DEFIB LIFELINK (PAD) ×4 IMPLANT
PATCH CARTO3 (PAD) ×4 IMPLANT
SHEATH AVANTI 11F 11CM (SHEATH) ×4 IMPLANT
SHEATH PINNACLE 7F 10CM (SHEATH) ×8 IMPLANT
SHEATH PINNACLE 9F 10CM (SHEATH) ×4 IMPLANT
SHEATH SWARTZ TS SL2 63CM 8.5F (SHEATH) ×4 IMPLANT
SHIELD RADPAD SCOOP 12X17 (MISCELLANEOUS) ×4 IMPLANT
SYR MEDRAD MARK V 150ML (SYRINGE) ×4 IMPLANT
TUBING CONTRAST HIGH PRESS 48 (TUBING) ×4 IMPLANT
TUBING COOLFLOW (TUBING) ×4 IMPLANT
TUBING SMART ABLATE COOLFLOW (TUBING) ×4 IMPLANT

## 2015-01-26 NOTE — Interval H&P Note (Signed)
History and Physical Interval Note:  01/26/2015 7:36 AM  [name]  has presented today for surgery, with the diagnosis of afib  The various methods of treatment have been discussed with the patient and family. After consideration of risks, benefits and other options for treatment, the patient has consented to  Procedure(s): Atrial Fibrillation Ablation (N/A) Transesophageal Echocardiogram [name]) as a surgical intervention .  The patient's history has been reviewed, patient examined, no change in status, stable for surgery.  I have reviewed the patient's chart and labs.  Questions were answered to the patient's satisfaction.     chads2vasc score is 3.  He reports compliance with xarelto.  TEE by Dr Acie Fredrickson this am prior to ablation. Therapeutic strategies for afib including medicine and ablation were discussed in detail with the patient today. Risk, benefits, and alternatives to EP study and radiofrequency ablation for afib were also discussed in detail today. These risks include but are not limited to stroke, bleeding, vascular damage, tamponade, perforation, damage to the esophagus, lungs, and other structures, pulmonary vein stenosis, worsening renal function, and death. The patient understands these risk and wishes to proceed.     [name]

## 2015-01-26 NOTE — Transfer of Care (Signed)
Immediate Anesthesia Transfer of Care Note  Patient: Damon [name]  Procedure(s) Performed: Procedure(s): Atrial Fibrillation Ablation (N/A) Transesophageal Echocardiogram [name])  Patient Location: PACU and Cath Lab  Anesthesia Type:General  Level of Consciousness: awake, patient cooperative and responds to stimulation  Airway & Oxygen Therapy: Patient Spontanous Breathing and Patient connected to nasal cannula oxygen  Post-op Assessment: Report given to RN and Post -op Vital signs reviewed and stable  Post vital signs: Reviewed and stable  Last Vitals:  Filed Vitals:   01/26/15 0558  BP: 116/71  Pulse: 54  Temp: 36.9 C  Resp: 18    Complications: No apparent anesthesia complications

## 2015-01-26 NOTE — H&P (View-Only) (Signed)
Patient ID: Damon [name], male   DOB: 1946-07-09, 69 y.o.   MRN: [id number]     Primary Care Physician: [name], MD Referring Physician:   RINALDO MACQUEEN is a 69 y.o. male with a h/o persistent afib for evaluation today in the afib clinic s/p cardioversion 12/28/14. He had an successful cardioversion and is maintaining sinus rhythm. He does have sinus brady today at 48 bpm but is asymptomatic . He is pending a redo ablation 7/8 with Dr. [name].  Today, he denies symptoms of palpitations, chest pain, shortness of breath, orthopnea, PND, lower extremity edema, dizziness, presyncope, syncope, or neurologic sequela. The patient is tolerating medications without difficulties and is otherwise without complaint today.   Past Medical History  Diagnosis Date  . Hyperlipidemia   . Mitral regurgitation   . Bladder cancer   . OSA on CPAP   . Hearing loss of both ears   . History of colon polyps     BENIGN  . PAD (peripheral artery disease)     ABI--  RIGHT SIDE NORMAL LEFT SIDE MODERATELY REDUCED W/ DISTAL LEFT SFA STENOSIS//  MILD CALDICATION  . S/P ablation of atrial fibrillation     07-27-2012  . Anticoagulant long-term use     XARELTO  . Hypertension   . PAF (paroxysmal atrial fibrillation) dx  2007 /   PREVIOUS CARDIOVERTED X3  LAST ONE 07-27-2012  POST  ABLATION  07-27-2012    PRIMARY CARDIOLOGIST-  DR [name] CARDIO--  DR [name]  . Pre-diabetes   . Vitamin D deficiency    Past Surgical History  Procedure Laterality Date  . Total hip arthroplasty  07/25/2011    Procedure: TOTAL HIP ARTHROPLASTY ANTERIOR APPROACH;  Surgeon: Mcarthur Rossetti;  Location: [facility];  Service: Orthopedics;  Laterality: Right;  . Tee without cardioversion  07/26/2012    Procedure: TRANSESOPHAGEAL ECHOCARDIOGRAM (TEE);  Surgeon: [name], MD;  Location: [facility] ENDOSCOPY;  Service: Cardiovascular;  Laterality: N/A;  . Cardioversion  08/06/2012    Procedure: CARDIOVERSION;   Surgeon: [name], MD;  Location: [place];  Service: Cardiovascular;  Laterality: N/A;  . Total hip arthroplasty Left 02-08-2010  . Cataract extraction w/ intraocular lens  implant, bilateral    . Cardiac electrophysiology study and ablation  07-27-2012  DR ALLRED    SUCCESSFUL ABLATION OF A-FIB  . Tonsillectomy  age 45  . Cystoscopy w/ retrogrades Bilateral 04/12/2013    Procedure: CYSTOSCOPY WITH RETROGRADE PYELOGRAM;  Surgeon: [name], MD;  Location: [facility];  Service: Urology;  Laterality: Bilateral;  . Transurethral resection of bladder tumor with gyrus (turbt-gyrus) N/A 04/12/2013    Procedure: TRANSURETHRAL RESECTION OF BLADDER TUMOR WITH GYRUS (TURBT-GYRUS);  Surgeon: [name], MD;  Location: [facility];  Service: Urology;  Laterality: N/A;  . Atrial fibrillation ablation N/A 07/27/2012    Procedure: ATRIAL FIBRILLATION ABLATION;  Surgeon: [name], MD;  Location: [facility] CATH LAB;  Service: Cardiovascular;  Laterality: N/A;  . Left heart catheterization with coronary angiogram N/A 11/24/2013    Procedure: LEFT HEART CATHETERIZATION WITH CORONARY ANGIOGRAM;  Surgeon: [name], MD;  Location: [facility] CATH LAB;  Service: Cardiovascular;  Laterality: N/A;  . Percutaneous coronary stent intervention (pci-s)  11/24/2013    Procedure: PERCUTANEOUS CORONARY STENT INTERVENTION (PCI-S);  Surgeon: [name], MD;  Location: [facility] CATH LAB;  Service: Cardiovascular;;  . Percutaneous coronary rotoblator intervention (pci-r) N/A 11/28/2013    Procedure: PERCUTANEOUS CORONARY  ROTOBLATOR INTERVENTION (PCI-R);  Surgeon: [name], MD;  Location: [facility] CATH LAB;  Service: Cardiovascular;  Laterality: N/A;  . Tee without cardioversion N/A 08/25/2014    Procedure: TRANSESOPHAGEAL ECHOCARDIOGRAM (TEE);  Surgeon: Larey Dresser, MD;  Location: [place];  Service: Cardiovascular;  Laterality: N/A;  . Cardioversion N/A 08/25/2014     Procedure: CARDIOVERSION;  Surgeon: Larey Dresser, MD;  Location: [place];  Service: Cardiovascular;  Laterality: N/A;  . Cardioversion N/A 11/13/2014    Procedure: CARDIOVERSION;  Surgeon: Sueanne Margarita, MD;  Location: [place];  Service: Cardiovascular;  Laterality: N/A;  . Cardioversion N/A 12/28/2014    Procedure: CARDIOVERSION;  Surgeon: [name], MD;  Location: [facility] ENDOSCOPY;  Service: Cardiovascular;  Laterality: N/A;    Current Outpatient Prescriptions  Medication Sig Dispense Refill  . Ascorbic Acid (VITAMIN C) 1000 MG tablet Take 1,000 mg by mouth daily as needed (vitamin supplement).     Marland Kitchen aspirin EC 81 MG tablet Take 1 tablet (81 mg total) by mouth daily. 90 tablet 3  . bisoprolol-hydrochlorothiazide (ZIAC) 10-6.25 MG per tablet Take 1 tablet by mouth daily. 90 tablet 3  . cholecalciferol (VITAMIN D) 1000 UNITS tablet Take 5,000 Units by mouth daily.     Marland Kitchen diltiazem (CARDIZEM CD) 120 MG 24 hr capsule Take 1 capsule (120 mg total) by mouth daily. 30 capsule 1  . Multiple Vitamin (MULITIVITAMIN WITH MINERALS) TABS Take 1 tablet by mouth daily. FOR MEN    . nitroGLYCERIN (NITROSTAT) 0.4 MG SL tablet Place 1 tablet (0.4 mg total) under the tongue every 5 (five) minutes as needed for chest pain. 25 tablet 2  . rivaroxaban (XARELTO) 20 MG TABS tablet Take 1 tablet (20 mg total) by mouth daily with supper. 30 tablet 11  . rosuvastatin (CRESTOR) 40 MG tablet Take 1 tablet (40 mg total) by mouth daily. (Patient taking differently: Take 20 mg by mouth daily. ) 30 tablet 0   No current facility-administered medications for this encounter.    Allergies  Allergen Reactions  . Zetia [Ezetimibe] Other (See Comments)    myalgias    History   Social History  . Marital Status: Married    Spouse Name: N/A  . Number of Children: N/A  . Years of Education: N/A   Occupational History  . Not on file.   Social History Main Topics  . Smoking status: Former Smoker -- 2.00  packs/day for 10 years    Types: Cigarettes    Quit date: 09/20/1981  . Smokeless tobacco: Never Used  . Alcohol Use: No     Comment: pt states he has stopped drinking alcohol  . Drug Use: No  . Sexual Activity: Not on file   Other Topics Concern  . Not on file   Social History Narrative    Family History  Problem Relation Age of Onset  . Heart attack Mother   . Heart disease Mother   . Heart failure Father   . Heart disease Father     ROS- All systems are reviewed and negative except as per the HPI above  Physical Exam: Filed Vitals:   01/04/15 1102  BP: 130/68  Pulse: 48  Height: 5\' 9"  (1.753 m)  Weight: 203 lb 9.6 oz (92.352 kg)    GEN- The patient is well appearing, alert and oriented x 3 today.   Head- normocephalic, atraumatic Eyes-  Sclera clear, conjunctiva pink Ears- hearing intact Oropharynx- clear Neck- supple, no JVP Lymph- no cervical  lymphadenopathy Lungs- Clear to ausculation bilaterally, normal work of breathing Heart- Regular rate and rhythm, no murmurs, rubs or gallops, PMI not laterally displaced GI- soft, NT, ND, + BS Extremities- no clubbing, cyanosis, or edema MS- no significant deformity or atrophy Skin- no rash or lesion Psych- euthymic mood, full affect Neuro- strength and sensation are intact  EKG- Sinus brady at 48 bpm, otherwise normal EKG.  Assessment and Plan:  1. Persistent afib Maintaining SR after successful cardioversion Scheduled for redo ablation 7/8.  2. Bradycardia Asymptomatic  3. HTN Stable  AFib clinic as needed

## 2015-01-26 NOTE — Progress Notes (Signed)
  Echocardiogram Echocardiogram Transesophageal has been performed.  Damon [name] 01/26/2015, 8:16 AM

## 2015-01-26 NOTE — Discharge Instructions (Signed)
No driving for 4 days. No lifting over 5 lbs for 1 week. No sexual activity for 1 week. You may return to work in 1 week. Keep procedure site clean & dry. If you notice increased pain, swelling, bleeding or pus, call/return!  You may shower, but no soaking baths/hot tubs/pools for 1 week.  ° ° °

## 2015-01-26 NOTE — Anesthesia Postprocedure Evaluation (Signed)
Anesthesia Post Note  Patient: Damon [name]  Procedure(s) Performed: Procedure(s) (LRB): Atrial Fibrillation Ablation (N/A) Transesophageal Echocardiogram [name])  Anesthesia type: general  Patient location: PACU  Post pain: Pain level controlled  Post assessment: Patient's Cardiovascular Status Stable  Last Vitals:  Filed Vitals:   01/26/15 1300  BP: 125/74  Pulse: 76  Temp:   Resp: 14    Post vital signs: Reviewed and stable  Level of consciousness: sedated  Complications: No apparent anesthesia complications

## 2015-01-26 NOTE — Progress Notes (Signed)
Wife in to visit.Resting quietly.

## 2015-01-26 NOTE — CV Procedure (Signed)
    Transesophageal Echocardiogram Note  Damon [name] [id number] [birth year]  Procedure: Transesophageal Echocardiogram Indications: pre-ablation , hx of atrial fib   Procedure Details Consent: Obtained Time Out: Verified patient identification, verified procedure, site/side was marked, verified correct patient position, special equipment/implants available, Radiology Safety Procedures followed,  medications/allergies/relevent history reviewed, required imaging and test results available.  Performed  Medications: Propofol : 50 mg iv drip   Left Ventrical:  Normal LV function   Mitral Valve: trivial MR   Aortic Valve: normal AV  Tricuspid Valve: normal TR, trivial TR   Pulmonic Valve: normal, whiff of PI  Left Atrium/ Left atrial appendage: no thrombi   Atrial septum: no PFO by color flow   Aorta: mild calcification    Complications: No apparent complications Patient did tolerate procedure well.   [name], Brooke Bonito., MD, [facility] 01/26/2015, 8:03 AM

## 2015-01-26 NOTE — Discharge Summary (Signed)
ELECTROPHYSIOLOGY PROCEDURE DISCHARGE SUMMARY    Patient ID: Damon [name],  MRN: [id number], DOB/AGE: 10-09-1945 69 y.o.  Admit date: 01/26/2015 Discharge date: 01/26/2015  Primary Care Physician: [name], MD Primary Cardiologist: Julianne Handler Electrophysiologist: [name], MD  Primary Discharge Diagnosis:  Persistent atrial fibrillation and atrial flutter status post ablation this admission  Secondary Discharge Diagnosis:  1.  Hyperlipidemia 2.  Mitral regurgitation 3.  Hypertension 4.  Bladder cancer 5.  OSA on CPAP  Procedures This Admission:  1.  Electrophysiology study and radiofrequency catheter ablation on 01/26/15 by Dr [name].  This study demonstrated sinus rhythm upon presentation; rotational Angiography reveals a moderate sized left atrium with four separate pulmonary veins without evidence of pulmonary vein stenosis; return of conduction within the right inferior and left inferior pulmonary veins after a prior ablation procedure; successful electrical re-isolation and anatomical encircling of all four pulmonary veins with radiofrequency current. A WACA approach was used. Additional left atrial ablation was performed to create a box lesions along the posterior wall; cavo-tricuspid isthmus ablation was performed with complete bidirectional isthmus block achieved; no inducible arrhythmias following ablation both on and off of dobutamine; no early apparent complications..    Brief HPI: Damon [name] is a 69 y.o. male with a history of persistent atrial fibrillation.  They have failed medical therapy with propafenone and dronederone.  He underwent ablation in 2014 and did very well but recently developed recurrent atrial fibrillation requiring cardioversion.  Risks, benefits, and alternatives to catheter ablation of atrial fibrillation were reviewed with the patient who wished to proceed.  The patient underwent TEE prior to the procedure which  demonstrated normal LV function and no LAA thrombus.    Hospital Course:  The patient was admitted and underwent EPS/RFCA of atrial fibrillation with details as outlined above.  They were monitored on telemetry overnight which demonstrated nsr.  Groin was without complication on the day of discharge.  The patient was examined and considered to be stable for discharge.  Wound care and restrictions were reviewed with the patient.  The patient will be seen back by Roderic Palau in 4 weeks and Dr [name] in 12 weeks for post ablation follow up.   Protonix will be added for 6 weeks following ablation.  Will discontinue Diltiazem at discharge.   This patients CHA2DS2-VASc Score and unadjusted Ischemic Stroke Rate (% per year) is equal to 2.2 % stroke rate/year from a score of 2 Above score calculated as 1 point each if present [CHF, HTN, DM, Vascular=MI/PAD/Aortic Plaque, Age if 65-74, or Male] Above score calculated as 2 points each if present [Age > 75, or Stroke/TIA/TE]   Physical Exam: Filed Vitals:   01/26/15 1440 01/26/15 1515 01/26/15 1700 01/26/15 1948  BP: 144/65 132/61 122/62 113/55  Pulse: 76 76 85 85  Temp:  98.5 F (36.9 C)  98.4 F (36.9 C)  TempSrc:  Oral Oral Oral  Resp: 13 16  20   Height:      Weight:      SpO2: 96% 97% 92% 94%    GEN- The patient is well appearing, alert and oriented x 3 today.   HEENT: normocephalic, atraumatic; sclera clear, conjunctiva pink; hearing intact; oropharynx clear; neck supple, no JVP Lymph- no cervical lymphadenopathy Lungs- Clear to ausculation bilaterally, normal work of breathing.  No wheezes, rales, rhonchi Heart- Regular rate and rhythm, no murmurs, rubs or gallops, PMI not laterally displaced GI- soft, non-tender, non-distended, bowel sounds present, no hepatosplenomegaly Extremities- no  clubbing, cyanosis, or edema; DP/PT/radial pulses 2+ bilaterally, groin without hematoma/bruit MS- no significant deformity or atrophy Skin- warm  and dry, no rash or lesion Psych- euthymic mood, full affect Neuro- strength and sensation are intact   Labs:   Lab Results  Component Value Date   WBC 5.8 01/23/2015   HGB 14.9 01/23/2015   HCT 43.9 01/23/2015   MCV 89.2 01/23/2015   PLT 196.0 01/23/2015     Recent Labs Lab 01/23/15 0909  NA 141  K 3.9  CL 106  CO2 26  BUN 23  CREATININE 0.94  CALCIUM 9.5  GLUCOSE 91     Discharge Medications:    Medication List    STOP taking these medications        diltiazem 120 MG 24 hr capsule  Commonly known as:  CARDIZEM CD      TAKE these medications        aspirin EC 81 MG tablet  Take 1 tablet (81 mg total) by mouth daily.     bisoprolol-hydrochlorothiazide 10-6.25 MG per tablet  Commonly known as:  ZIAC  Take 1 tablet by mouth daily.     cholecalciferol 1000 UNITS tablet  Commonly known as:  VITAMIN D  Take 5,000 Units by mouth daily.     multivitamin with minerals Tabs tablet  Take 1 tablet by mouth daily. FOR MEN     nitroGLYCERIN 0.4 MG SL tablet  Commonly known as:  NITROSTAT  Place 1 tablet (0.4 mg total) under the tongue every 5 (five) minutes as needed for chest pain.     pantoprazole 40 MG tablet  Commonly known as:  PROTONIX  Take 1 tablet (40 mg total) by mouth daily.     rivaroxaban 20 MG Tabs tablet  Commonly known as:  XARELTO  Take 1 tablet (20 mg total) by mouth daily with supper.     rosuvastatin 40 MG tablet  Commonly known as:  CRESTOR  Take 1 tablet (40 mg total) by mouth daily.     vitamin C 1000 MG tablet  Take 1,000 mg by mouth daily as needed (vitamin supplement).        Disposition:  Discharge Instructions    Diet - low sodium heart healthy    Complete by:  As directed      Increase activity slowly    Complete by:  As directed           Follow-up Information    Follow up with [facility] On 02/23/2015.   Why:  at [facility] information:   [place]  [phone]      Follow up with [name], MD On 04/30/2015.   Specialty:  Cardiology   Why:  at [facility] information:   [place] [address] [phone]       Duration of Discharge Encounter: Greater than 30 minutes including physician time.  Signed, Chanetta Marshall, NP 01/26/2015 7:55 PM  EP Attending  Patient seen and examined. Agree with note above. He is doing well after catheter ablation. Minimal chest pain which is pleuritic. He is stable for discharge. Usual followup.   Mikle Bosworth.D.

## 2015-01-26 NOTE — Progress Notes (Signed)
IV flushed w/sterile NS and locked per order. Waiting on TCU/2H bed assignment.

## 2015-01-26 NOTE — Progress Notes (Signed)
Site area: rt groin 3 fv sheaths Site Prior to Removal:  Level 0 Pressure Applied For: 15 minutes Manual:   yes Patient Status During Pull:  stable Post Pull Site:  Level  0 Post Pull Instructions Given:  yes Post Pull Pulses Present: yes Dressing Applied:  Small tegaderm Bedrest begins @  1310 Comments:  none

## 2015-01-26 NOTE — Anesthesia Preprocedure Evaluation (Addendum)
Anesthesia Evaluation  Patient identified by MRN, date of birth, ID band Patient awake    Reviewed: Allergy & Precautions, NPO status , Patient's Chart, lab work & pertinent test results  Airway Mallampati: II  TM Distance: >3 FB Neck ROM: Full    Dental   Pulmonary sleep apnea , former smoker,  breath sounds clear to auscultation        Cardiovascular hypertension, + angina + CAD and + Peripheral Vascular Disease Rhythm:Regular Rate:Normal     Neuro/Psych    GI/Hepatic negative GI ROS, Neg liver ROS,   Endo/Other  negative endocrine ROS  Renal/GU negative Renal ROS     Musculoskeletal   Abdominal   Peds  Hematology   Anesthesia Other Findings   Reproductive/Obstetrics                            Anesthesia Physical Anesthesia Plan  ASA: III  Anesthesia Plan: General   Post-op Pain Management:    Induction: Intravenous  Airway Management Planned: LMA  Additional Equipment:   Intra-op Plan:   Post-operative Plan: Extubation in OR  Informed Consent: I have reviewed the patients History and Physical, chart, labs and discussed the procedure including the risks, benefits and alternatives for the proposed anesthesia with the patient or authorized representative who has indicated his/her understanding and acceptance.   Dental advisory given  Plan Discussed with: CRNA and Anesthesiologist  Anesthesia Plan Comments:         Anesthesia Quick Evaluation

## 2015-01-26 NOTE — Interval H&P Note (Signed)
History and Physical Interval Note:  01/26/2015 8:03 AM  Damon [name]  has presented today for surgery, with the diagnosis of afib  The various methods of treatment have been discussed with the patient and family. After consideration of risks, benefits and other options for treatment, the patient has consented to  Procedure(s): Atrial Fibrillation Ablation (N/A) Transesophageal Echocardiogram [name]) as a surgical intervention .  The patient's history has been reviewed, patient examined, no change in status, stable for surgery.  I have reviewed the patient's chart and labs.  Questions were answered to the patient's satisfaction.     Nahser, Wonda Cheng

## 2015-01-27 DIAGNOSIS — I480 Paroxysmal atrial fibrillation: Secondary | ICD-10-CM

## 2015-01-27 DIAGNOSIS — I10 Essential (primary) hypertension: Secondary | ICD-10-CM | POA: Diagnosis not present

## 2015-01-27 DIAGNOSIS — I481 Persistent atrial fibrillation: Secondary | ICD-10-CM | POA: Diagnosis not present

## 2015-01-27 DIAGNOSIS — I340 Nonrheumatic mitral (valve) insufficiency: Secondary | ICD-10-CM | POA: Diagnosis not present

## 2015-01-27 DIAGNOSIS — E785 Hyperlipidemia, unspecified: Secondary | ICD-10-CM | POA: Diagnosis not present

## 2015-01-27 LAB — BASIC METABOLIC PANEL
Anion gap: 7 (ref 5–15)
BUN: 12 mg/dL (ref 6–20)
CALCIUM: 8.5 mg/dL — AB (ref 8.9–10.3)
CO2: 26 mmol/L (ref 22–32)
Chloride: 106 mmol/L (ref 101–111)
Creatinine, Ser: 0.97 mg/dL (ref 0.61–1.24)
GFR calc Af Amer: >60 mL/min (ref >60)
GLUCOSE: 95 mg/dL (ref 65–99)
Potassium: 3.4 mmol/L — ABNORMAL LOW (ref 3.5–5.1)
Sodium: 139 mmol/L (ref 135–145)

## 2015-01-27 MED ORDER — OFF THE BEAT BOOK
Freq: Once | Status: AC
  Administered 2015-01-27: 06:00:00
  Filled 2015-01-27: qty 1

## 2015-01-29 MED FILL — Bupivacaine HCl Preservative Free (PF) Inj 0.25%: INTRAMUSCULAR | Qty: 30 | Status: AC

## 2015-02-06 ENCOUNTER — Encounter (HOSPITAL_COMMUNITY): Payer: Self-pay | Admitting: *Deleted

## 2015-02-12 ENCOUNTER — Other Ambulatory Visit: Payer: Self-pay | Admitting: Gastroenterology

## 2015-02-13 ENCOUNTER — Ambulatory Visit (HOSPITAL_COMMUNITY): Payer: Medicare Other | Admitting: Anesthesiology

## 2015-02-13 ENCOUNTER — Encounter (HOSPITAL_COMMUNITY)
Admission: RE | Disposition: A | Discharge status: Home / Self Care | Payer: Self-pay | Source: Ambulatory Visit | Attending: Gastroenterology

## 2015-02-13 ENCOUNTER — Encounter (HOSPITAL_COMMUNITY): Payer: Self-pay

## 2015-02-13 ENCOUNTER — Ambulatory Visit (HOSPITAL_COMMUNITY)
Admission: RE | Admit: 2015-02-13 | Discharge: 2015-02-13 | Disposition: A | Discharge status: Home / Self Care | Payer: Medicare Other | Source: Ambulatory Visit | Attending: Gastroenterology | Admitting: Gastroenterology

## 2015-02-13 DIAGNOSIS — Z8551 Personal history of malignant neoplasm of bladder: Secondary | ICD-10-CM | POA: Diagnosis not present

## 2015-02-13 DIAGNOSIS — I4891 Unspecified atrial fibrillation: Secondary | ICD-10-CM | POA: Insufficient documentation

## 2015-02-13 DIAGNOSIS — D120 Benign neoplasm of cecum: Secondary | ICD-10-CM | POA: Diagnosis not present

## 2015-02-13 DIAGNOSIS — G4730 Sleep apnea, unspecified: Secondary | ICD-10-CM | POA: Diagnosis not present

## 2015-02-13 DIAGNOSIS — Z9989 Dependence on other enabling machines and devices: Secondary | ICD-10-CM | POA: Insufficient documentation

## 2015-02-13 DIAGNOSIS — M1990 Unspecified osteoarthritis, unspecified site: Secondary | ICD-10-CM | POA: Diagnosis not present

## 2015-02-13 DIAGNOSIS — Z09 Encounter for follow-up examination after completed treatment for conditions other than malignant neoplasm: Secondary | ICD-10-CM | POA: Diagnosis present

## 2015-02-13 DIAGNOSIS — I739 Peripheral vascular disease, unspecified: Secondary | ICD-10-CM | POA: Diagnosis not present

## 2015-02-13 DIAGNOSIS — K219 Gastro-esophageal reflux disease without esophagitis: Secondary | ICD-10-CM | POA: Insufficient documentation

## 2015-02-13 DIAGNOSIS — I10 Essential (primary) hypertension: Secondary | ICD-10-CM | POA: Diagnosis not present

## 2015-02-13 DIAGNOSIS — D759 Disease of blood and blood-forming organs, unspecified: Secondary | ICD-10-CM | POA: Insufficient documentation

## 2015-02-13 DIAGNOSIS — Z86010 Personal history of colonic polyps: Secondary | ICD-10-CM | POA: Diagnosis not present

## 2015-02-13 DIAGNOSIS — I2510 Atherosclerotic heart disease of native coronary artery without angina pectoris: Secondary | ICD-10-CM | POA: Diagnosis not present

## 2015-02-13 DIAGNOSIS — Z7902 Long term (current) use of antithrombotics/antiplatelets: Secondary | ICD-10-CM | POA: Insufficient documentation

## 2015-02-13 DIAGNOSIS — Z87891 Personal history of nicotine dependence: Secondary | ICD-10-CM | POA: Diagnosis not present

## 2015-02-13 DIAGNOSIS — D122 Benign neoplasm of ascending colon: Secondary | ICD-10-CM | POA: Diagnosis not present

## 2015-02-13 DIAGNOSIS — Z79899 Other long term (current) drug therapy: Secondary | ICD-10-CM | POA: Insufficient documentation

## 2015-02-13 DIAGNOSIS — Z7982 Long term (current) use of aspirin: Secondary | ICD-10-CM | POA: Diagnosis not present

## 2015-02-13 DIAGNOSIS — Z6830 Body mass index (BMI) 30.0-30.9, adult: Secondary | ICD-10-CM | POA: Diagnosis not present

## 2015-02-13 DIAGNOSIS — Z955 Presence of coronary angioplasty implant and graft: Secondary | ICD-10-CM | POA: Insufficient documentation

## 2015-02-13 HISTORY — PX: COLONOSCOPY WITH PROPOFOL: SHX5780

## 2015-02-13 SURGERY — COLONOSCOPY WITH PROPOFOL
Anesthesia: Monitor Anesthesia Care

## 2015-02-13 MED ORDER — LIDOCAINE HCL (CARDIAC) 20 MG/ML IV SOLN
INTRAVENOUS | Status: DC | PRN
  Administered 2015-02-13: 100 mg via INTRAVENOUS

## 2015-02-13 MED ORDER — SPOT INK MARKER SYRINGE KIT
PACK | SUBMUCOSAL | Status: DC | PRN
  Administered 2015-02-13: 1 mL via SUBMUCOSAL

## 2015-02-13 MED ORDER — SPOT INK MARKER SYRINGE KIT
PACK | SUBMUCOSAL | Status: AC
  Filled 2015-02-13: qty 5

## 2015-02-13 MED ORDER — LACTATED RINGERS IV SOLN
INTRAVENOUS | Status: DC
  Administered 2015-02-13: 1000 mL via INTRAVENOUS

## 2015-02-13 MED ORDER — PROPOFOL 10 MG/ML IV BOLUS
INTRAVENOUS | Status: AC
Start: 2015-02-13 — End: 2015-02-13
  Filled 2015-02-13: qty 20

## 2015-02-13 MED ORDER — PROPOFOL 10 MG/ML IV BOLUS
INTRAVENOUS | Status: AC
  Filled 2015-02-13: qty 20

## 2015-02-13 MED ORDER — SODIUM CHLORIDE 0.9 % IV SOLN: INTRAVENOUS | Status: DC

## 2015-02-13 MED ORDER — PROPOFOL 10 MG/ML IV BOLUS
INTRAVENOUS | Status: DC | PRN
  Administered 2015-02-13: 20 mg via INTRAVENOUS
  Administered 2015-02-13: 10 mg via INTRAVENOUS
  Administered 2015-02-13: 50 mg via INTRAVENOUS
  Administered 2015-02-13 (×2): 20 mg via INTRAVENOUS
  Administered 2015-02-13: 50 mg via INTRAVENOUS
  Administered 2015-02-13 (×2): 20 mg via INTRAVENOUS
  Administered 2015-02-13 (×2): 50 mg via INTRAVENOUS
  Administered 2015-02-13 (×2): 20 mg via INTRAVENOUS

## 2015-02-13 SURGICAL SUPPLY — 21 items

## 2015-02-13 NOTE — Transfer of Care (Signed)
Immediate Anesthesia Transfer of Care Note  Patient: Damon [name]  Procedure(s) Performed: Procedure(s): COLONOSCOPY WITH PROPOFOL (N/A)  Patient Location: PACU  Anesthesia Type:MAC  Level of Consciousness: awake, alert  and oriented  Airway & Oxygen Therapy: Patient Spontanous Breathing and Patient connected to face mask oxygen  Post-op Assessment: Report given to RN and Post -op Vital signs reviewed and stable  Post vital signs: Reviewed and stable  Last Vitals:  Filed Vitals:   02/13/15 1055  BP: 130/68  Pulse: 65  Temp: 36.4 C  Resp: 14    Complications: No apparent anesthesia complications

## 2015-02-13 NOTE — Op Note (Signed)
Procedure: Surveillance colonoscopy. History of adenomatous colon polyps removed colonoscopically in the past. Chronic aspirin therapy following placement of a drug eluting coronary artery stent in May 2015. Xarelto anticoagulation to treat atrial fibrillation. Recent cardiac ablation therapy to treat atrial fibrillation. Patient is in sinus rhythm.  Endoscopist: Earle Gell  Premedication: Propofol administered by anesthesia  Procedure: The patient was placed in the left lateral decubitus position. Anal inspection and digital rectal exam were normal. The Pentax pediatric colonoscope was introduced into the rectum and advanced to the cecum. A normal-appearing appendiceal orifice was identified. A normal-appearing ileocecal valve was intubated and the terminal ileum suspected. Colonic preparation for the exam today was good. Withdrawal time was 24 minutes  Rectum. Normal. Retroflex view of the distal rectum was normal  Sigmoid colon and descending colon. Normal  Splenic flexure. Normal  Transverse colon. Normal  Hepatic flexure. Normal  Ascending colon. Located in the proximal ascending colon behind a mucosal fold was a 2 cm sessile polyp which was biopsied to confirm the presence of neoplastic tissue. The mucosal fold proximal to the suspected polyp was tattooed with spot for future location of the polyp.  Cecum and ileocecal valve. A 3 mm sessile polyp was removed from the cecum with the cold biopsy forceps  Terminal ileum. Normal  Assessment:  #1. A diminutive polyp was removed from the cecum with the cold biopsy forceps  #2. A suspected neoplastic polyp located in the proximal ascending colon behind a mucosal fold was biopsied to determine if the polyp should be removed by Dr. Ivor Messier at [facility]. The mucosal fold just proximal to the suspicious polyp was tattooed with spot for future identification.

## 2015-02-13 NOTE — Anesthesia Postprocedure Evaluation (Signed)
  Anesthesia Post-op Note  Patient: Damon [name]  Procedure(s) Performed: Procedure(s): COLONOSCOPY WITH PROPOFOL (N/A)  Patient Location: Endoscopy Unit  Anesthesia Type:MAC  Level of Consciousness: awake, alert , oriented and patient cooperative  Airway and Oxygen Therapy: Patient Spontanous Breathing  Post-op Pain: none  Post-op Assessment: Post-op Vital signs reviewed, Patient's Cardiovascular Status Stable, Respiratory Function Stable, Patent Airway, No signs of Nausea or vomiting and Pain level controlled              Post-op Vital Signs: Reviewed and stable  Last Vitals:  Filed Vitals:   02/13/15 1230  BP: 126/74  Pulse: 58  Temp:   Resp: 16    Complications: No apparent anesthesia complications

## 2015-02-13 NOTE — H&P (Signed)
  Procedure: Surveillance colonoscopy. History of adenomatous colon polyps removed colonoscopically in the past. 657/2011 normal surveillance colonoscopy. Xarelto anticoagulation to treat atrial fibrillation. Chronic aspirin and Plavix therapy prescribed following placement of a drug eluting coronary artery stent in May 2015.  History: The patient is a 69 year old male born Oct 25, 1945. He is scheduled to undergo a surveillance colonoscopy.  In May 2015, the patient had a drug eluting coronary artery stent placed. He chronically takes Plavix.  He has undergone 2 cardiac ablation therapies to treat atrial fibrillation. His last procedure was performed a few weeks ago and he is currently in sinus rhythm.  The patient stopped taking Plavix one week ago and stopped taking xarelto 4 days ago.  Medication allergies: None  Past medical history: Hypertriglyceridemia. Hypertension. Atrial fibrillation. Coronary artery disease with drug-eluting coronary artery stent placement in May 2015. Cataract surgery. Tonsillectomy.  Exam: The patient is alert and lying comfortably on the endoscopy stretcher. Abdomen is soft and nontender to palpation. Lungs are clear to auscultation. Cardiac exam reveals a regular rhythm post cardiac ablation therapy to treat atrial fibrillation.  Plan: Proceed with surveillance colonoscopy

## 2015-02-13 NOTE — Discharge Instructions (Signed)
Conscious Sedation, Adult, Care After Refer to this sheet in the next few weeks. These instructions provide you with information on caring for yourself after your procedure. Your health care provider may also give you more specific instructions. Your treatment has been planned according to current medical practices, but problems sometimes occur. Call your health care provider if you have any problems or questions after your procedure. WHAT TO EXPECT AFTER THE PROCEDURE  After your procedure:  You may feel sleepy, clumsy, and have poor balance for several hours.  Vomiting may occur if you eat too soon after the procedure. HOME CARE INSTRUCTIONS  Do not participate in any activities where you could become injured for at least 24 hours. Do not:  Drive.  Swim.  Ride a bicycle.  Operate heavy machinery.  Cook.  Use power tools.  Climb ladders.  Work from a high place.  Do not make important decisions or sign legal documents until you are improved.  If you vomit, drink water, juice, or soup when you can drink without vomiting. Make sure you have little or no nausea before eating solid foods.  Only take over-the-counter or prescription medicines for pain, discomfort, or fever as directed by your health care provider.  Make sure you and your family fully understand everything about the medicines given to you, including what side effects may occur.  You should not drink alcohol, take sleeping pills, or take medicines that cause drowsiness for at least 24 hours.  If you smoke, do not smoke without supervision.  If you are feeling better, you may resume normal activities 24 hours after you were sedated.  Keep all appointments with your health care provider. SEEK MEDICAL CARE IF:  Your skin is pale or bluish in color.  You continue to feel nauseous or vomit.  Your pain is getting worse and is not helped by medicine.  You have bleeding or swelling.  You are still sleepy or  feeling clumsy after 24 hours. SEEK IMMEDIATE MEDICAL CARE IF:  You develop a rash.  You have difficulty breathing.  You develop any type of allergic problem.  You have a fever. MAKE SURE YOU:  Understand these instructions.  Will watch your condition.  Will get help right away if you are not doing well or get worse. Document Released: 04/27/2013 Document Reviewed: 04/27/2013 [facility] Patient Information 2015 [place], [place]. This information is not intended to replace advice given to you by your health care provider. Make sure you discuss any questions you have with your health care provider.     Colonoscopy, Care After Refer to this sheet in the next few weeks. These instructions provide you with information on caring for yourself after your procedure. Your health care provider may also give you more specific instructions. Your treatment has been planned according to current medical practices, but problems sometimes occur. Call your health care provider if you have any problems or questions after your procedure. WHAT TO EXPECT AFTER THE PROCEDURE  After your procedure, it is typical to have the following:  A small amount of blood in your stool.  Moderate amounts of gas and mild abdominal cramping or bloating. HOME CARE INSTRUCTIONS  Do not drive, operate machinery, or sign important documents for 24 hours.  You may shower and resume your regular physical activities, but move at a slower pace for the first 24 hours.  Take frequent rest periods for the first 24 hours.  Walk around or put a warm pack on your abdomen to  help reduce abdominal cramping and bloating.  Drink enough fluids to keep your urine clear or pale yellow.  You may resume your normal diet as instructed by your health care provider. Avoid heavy or fried foods that are hard to digest.  Avoid drinking alcohol for 24 hours or as instructed by your health care provider.  Only take over-the-counter or  prescription medicines as directed by your health care provider.  If a tissue sample (biopsy) was taken during your procedure:  Do not take aspirin or blood thinners for 7 days, or as instructed by your health care provider.  Do not drink alcohol for 7 days, or as instructed by your health care provider.  Eat soft foods for the first 24 hours. SEEK MEDICAL CARE IF: You have persistent spotting of blood in your stool 2-3 days after the procedure. SEEK IMMEDIATE MEDICAL CARE IF:  You have more than a small spotting of blood in your stool.  You pass large blood clots in your stool.  Your abdomen is swollen (distended).  You have nausea or vomiting.  You have a fever.  You have increasing abdominal pain that is not relieved with medicine. Document Released: 02/19/2004 Document Revised: 04/27/2013 Document Reviewed: 03/14/2013 [facility] Patient Information 2015 [place], [place]. This information is not intended to replace advice given to you by your health care provider. Make sure you discuss any questions you have with your health care provider.

## 2015-02-13 NOTE — Anesthesia Preprocedure Evaluation (Addendum)
Anesthesia Evaluation  Patient identified by MRN, date of birth, ID band Patient awake    Reviewed: Allergy & Precautions, NPO status , Patient's Chart, lab work & pertinent test results  History of Anesthesia Complications Negative for: history of anesthetic complications  Airway Mallampati: II  TM Distance: >3 FB Neck ROM: Full    Dental  (+) Dental Advisory Given   Pulmonary sleep apnea and Continuous Positive Airway Pressure Ventilation , former smoker (quit 1983),  breath sounds clear to auscultation        Cardiovascular hypertension, Pt. on medications and Pt. on home beta blockers - angina+ CAD, + Cardiac Stents and + Peripheral Vascular Disease + dysrhythmias (s/p ablation) Atrial Fibrillation Rhythm:Regular Rate:Normal  1/16 TEE: EF 55-60%, valves OK   Neuro/Psych negative neurological ROS     GI/Hepatic Neg liver ROS, GERD-  Medicated,  Endo/Other  Morbid obesity  Renal/GU negative Renal ROS   Bladder cancer    Musculoskeletal  (+) Arthritis -,   Abdominal (+) + obese,   Peds  Hematology  (+) Blood dyscrasia (xarelto), ,   Anesthesia Other Findings   Reproductive/Obstetrics                          Anesthesia Physical Anesthesia Plan  ASA: III  Anesthesia Plan: MAC   Post-op Pain Management:    Induction: Intravenous  Airway Management Planned: Natural Airway and Nasal Cannula  Additional Equipment:   Intra-op Plan:   Post-operative Plan:   Informed Consent: I have reviewed the patients History and Physical, chart, labs and discussed the procedure including the risks, benefits and alternatives for the proposed anesthesia with the patient or authorized representative who has indicated his/her understanding and acceptance.   Dental advisory given  Plan Discussed with: CRNA and Surgeon  Anesthesia Plan Comments: (Plan routine monitors, MAC)        Anesthesia  Quick Evaluation

## 2015-02-14 ENCOUNTER — Encounter (HOSPITAL_COMMUNITY): Payer: Self-pay | Admitting: Gastroenterology

## 2015-02-15 ENCOUNTER — Encounter: Payer: Self-pay | Admitting: Internal Medicine

## 2015-02-16 ENCOUNTER — Encounter: Payer: Self-pay | Admitting: Internal Medicine

## 2015-02-16 ENCOUNTER — Ambulatory Visit (INDEPENDENT_AMBULATORY_CARE_PROVIDER_SITE_OTHER): Payer: Medicare Other | Admitting: Internal Medicine

## 2015-02-16 VITALS — BP 112/74 | HR 72 | Temp 97.7°F | Resp 16 | Ht 69.5 in | Wt 194.4 lb

## 2015-02-16 DIAGNOSIS — Z6828 Body mass index (BMI) 28.0-28.9, adult: Secondary | ICD-10-CM

## 2015-02-16 DIAGNOSIS — I25118 Atherosclerotic heart disease of native coronary artery with other forms of angina pectoris: Secondary | ICD-10-CM

## 2015-02-16 DIAGNOSIS — C679 Malignant neoplasm of bladder, unspecified: Secondary | ICD-10-CM | POA: Insufficient documentation

## 2015-02-16 DIAGNOSIS — G4730 Sleep apnea, unspecified: Secondary | ICD-10-CM

## 2015-02-16 DIAGNOSIS — R7309 Other abnormal glucose: Secondary | ICD-10-CM | POA: Diagnosis not present

## 2015-02-16 DIAGNOSIS — R6889 Other general symptoms and signs: Secondary | ICD-10-CM | POA: Diagnosis not present

## 2015-02-16 DIAGNOSIS — I10 Essential (primary) hypertension: Secondary | ICD-10-CM

## 2015-02-16 DIAGNOSIS — E559 Vitamin D deficiency, unspecified: Secondary | ICD-10-CM

## 2015-02-16 DIAGNOSIS — Z0001 Encounter for general adult medical examination with abnormal findings: Secondary | ICD-10-CM | POA: Diagnosis not present

## 2015-02-16 DIAGNOSIS — Z125 Encounter for screening for malignant neoplasm of prostate: Secondary | ICD-10-CM

## 2015-02-16 DIAGNOSIS — Z79899 Other long term (current) drug therapy: Secondary | ICD-10-CM

## 2015-02-16 DIAGNOSIS — R7303 Prediabetes: Secondary | ICD-10-CM

## 2015-02-16 DIAGNOSIS — K219 Gastro-esophageal reflux disease without esophagitis: Secondary | ICD-10-CM | POA: Insufficient documentation

## 2015-02-16 DIAGNOSIS — Z9181 History of falling: Secondary | ICD-10-CM

## 2015-02-16 DIAGNOSIS — Z1331 Encounter for screening for depression: Secondary | ICD-10-CM

## 2015-02-16 DIAGNOSIS — E782 Mixed hyperlipidemia: Secondary | ICD-10-CM

## 2015-02-16 LAB — TSH: TSH: 0.954 u[IU]/mL (ref 0.350–4.500)

## 2015-02-16 LAB — CBC WITH DIFFERENTIAL/PLATELET
Basophils Absolute: 0.1 10*3/uL (ref 0.0–0.1)
Basophils Relative: 1 % (ref 0–1)
EOS ABS: 0.2 10*3/uL (ref 0.0–0.7)
EOS PCT: 3 % (ref 0–5)
HCT: 44.1 % (ref 39.0–52.0)
HEMOGLOBIN: 15.2 g/dL (ref 13.0–17.0)
Lymphocytes Relative: 23 % (ref 12–46)
Lymphs Abs: 1.5 10*3/uL (ref 0.7–4.0)
MCH: 30.2 pg (ref 26.0–34.0)
MCHC: 34.5 g/dL (ref 30.0–36.0)
MCV: 87.5 fL (ref 78.0–100.0)
MPV: 9.4 fL (ref 8.6–12.4)
Monocytes Absolute: 0.6 10*3/uL (ref 0.1–1.0)
Monocytes Relative: 9 % (ref 3–12)
Neutro Abs: 4.2 10*3/uL (ref 1.7–7.7)
Neutrophils Relative %: 64 % (ref 43–77)
Platelets: 207 10*3/uL (ref 150–400)
RBC: 5.04 MIL/uL (ref 4.22–5.81)
RDW: 13.7 % (ref 11.5–15.5)
WBC: 6.5 10*3/uL (ref 4.0–10.5)

## 2015-02-16 LAB — HEMOGLOBIN A1C
Hgb A1c MFr Bld: 5.5 % (ref <5.7)
MEAN PLASMA GLUCOSE: 111 mg/dL (ref <117)

## 2015-02-16 NOTE — Patient Instructions (Signed)

## 2015-02-16 NOTE — Progress Notes (Signed)
Patient ID: Damon [name], male   DOB: 1946-01-28, 69 y.o.   MRN: [id number]  MEDICARE ANNUAL WELLNESS VISIT AND CPE  Assessment:   1. Essential hypertension  - Microalbumin / creatinine urine ratio - [place], RETROPERITNL ABD,  LTD - TSH  2. Hyperlipidemia  - Lipid panel  3. Prediabetes  - Hemoglobin A1c - Insulin, random  4. Vitamin D deficiency  - Vit D  25 hydroxy   5. ASCAD s/p PTCA/DES   6. Gastroesophageal reflux disease   7. Sleep apnea   8. Prostate cancer screening  - PSA  9. Depression screen   10. At low risk for fall   11. Medication management  - Urine Microscopic - BASIC METABOLIC PANEL WITH GFR - Hepatic function panel - Magnesium  12. Encounter for general adult medical examination with abnormal findings  - Urine Microscopic - Microalbumin / creatinine urine ratio - [place], RETROPERITNL ABD,  LTD - PSA - CBC with Differential/Platelet - BASIC METABOLIC PANEL WITH GFR - Hepatic function panel - Magnesium - Lipid panel - TSH - Hemoglobin A1c - Insulin, random - Vit D  25 hydroxy (rtn osteoporosis monitoring)  Plan:   During the course of the visit the patient was educated and counseled about appropriate screening and preventive services including:    Pneumococcal vaccine   Influenza vaccine  Td vaccine  Screening electrocardiogram  Bone densitometry screening  Colorectal cancer screening  Diabetes screening  Glaucoma screening  Nutrition counseling   Advanced directives: requested  Screening recommendations, referrals: Vaccinations: Immunization History  Administered Date(s) Administered  . DT 07/22/2003, 12/28/2013  . Influenza Whole 05/19/2013  . Influenza,inj,Quad PF,36+ Mos 05/09/2014  . Pneumococcal Conjugate-13 07/11/2014  . Pneumococcal Polysaccharide-23 12/04/2009  . Tdap 09/22/2013  Shingles vaccine declined Hep B vaccine not indicated  Nutrition assessed and recommended  Colonoscopy 02/13/2015  as above Recommended yearly ophthalmology/optometry visit for glaucoma screening and checkup Recommended yearly dental visit for hygiene and checkup Advanced directives - no - offered forms  Conditions/risks identified: BMI: Discussed weight loss, diet, and increase physical activity.  Increase physical activity: AHA recommends 150 minutes of physical activity a week.  Medications reviewed PreDiabetes is at goal, ACE/ARB therapy: Not Indicated Urinary Incontinence is not an issue: discussed non pharmacology and pharmacology options.  Fall risk: low- discussed PT, home fall assessment, medications.   Subjective:     Damon [name]  presents for Medicare Annual Wellness Visit and complete physical.  Date of 1st medicare wellness visit was 03/20/2014. This very nice 69 y.o. MWM presents for follow up with Hypertension, Hyperlipidemia, Pre-Diabetes and Vitamin D Deficiency. Patient also relates he had recent Colonoscopy on 02/13/2015 by Dr Jerilynn Mages. Wynetta Emery and path of a "flat" polyp is pending. Patient also has GERD with Sx's controlled with diet & meds.     In Sept 2014 , Patient had TUR of a Bladder cancer by Dr Carrie Mew who continues close surveillance.     Patient is treated for HTN since 1996  & BP has been controlled at home. Today's BP: 112/74 mmHg.  Patient has hx/o pAfib/flutter predating back to 2004 and has had several CV with # 3 this year in Jan, Feb & June. His 1st ablation was in Jan 2014 and 2sd most recent was on January 26 2015.  In May 2015 he had PDCA/DES following Rotablator atherotomy at the prox circ. Patient has had no complaints of any cardiac type chest pain, palpitations, dyspnea/orthopnea/PND, dizziness, claudication, or dependent edema.  Hyperlipidemia is controlled with diet & meds. Patient denies myalgias or other med SE's. Last Lipids were at goal -  Cholesterol 146; HDL 60; LDL 70; Triglycerides 80 on 10/17/2014.    Also, the patient has history of PreDiabetes with A1c 5.7%  in 2011 and 5.9% in 2013, and then 5.6% x 2 in late 2013.  He's had no symptoms of reactive hypoglycemia, diabetic polys, paresthesias or visual blurring.  Last A1c was  5.6% on 10/17/2014.    Further, the patient also has history of Vitamin D Deficiency of 27 in 2008 and supplements vitamin D without any suspected side-effects. Last vitamin D was 49 on 10/17/2014.  Names of Other Physician/Practitioners you currently use: 1. [facility] here for primary care 2. Dr Sherley Bounds, eye doctor, last visit  2015 3. ? Name of his dentist, dentist, last visit 2014  Patient Care Team: [name], MD as PCP - General (Internal Medicine) Dwan Bolt, MD as Referring Physician (Cardiology) [name], MD as Consulting Physician (Cardiology) [name], MD as Consulting Physician (Urology) Mcarthur Rossetti, MD as Consulting Physician (Orthopedic Surgery) Ponciano Ort, MD as Referring Physician (Urology) Garlan Fair, MD as Consulting Physician (Gastroenterology) [name], MD as Consulting Physician (Cardiology)  Medication Review: Medication Sig  . VITAMIN C 1000 MG tablet Take 1,000 mg by mouth daily as needed (vitamin supplement).   Marland Kitchen aspirin EC 81 MG tablet Take 1 tablet (81 mg total) by mouth daily.  . bisoprolol-hctz (ZIAC) 10-6.25 MG per tablet Take 1 tablet by mouth daily.  Marland Kitchen VITAMIN D 1000 UNITS tablet Take 5,000 Units by mouth daily.   Edd Fabian WITH MINERALS Take 1 tablet by mouth daily. FOR MEN  . NITROSTAT 0.4 MG SL tablet Place 1 tablet (0.4 mg total) under the tongue every 5 (five) minutes as needed for chest pain.  . pantoprazole  40 MG tablet Take 1 tablet (40 mg total) by mouth daily.  . rivaroxaban (XARELTO) 20 MG TABS  Take 1 tablet (20 mg total) by mouth daily with supper.   Diltiazem 120 mg CD Take 1 tablet daily  . rosuvastatin  40 MG tablet Take 1 tablet (40 mg total) by mouth  daily. (Patient taking differently: Take 20 mg by mouth daily. )   Allergies  Allergen Reactions  . Zetia [Ezetimibe] Other (See Comments)    myalgias   Current Problems (verified) Patient Active Problem List   Diagnosis Date Noted  . Bladder cancer 02/16/2015  . GERD  02/16/2015  . A-fib 01/26/2015  . Persistent atrial fibrillation   . Medication management 12/28/2013  . CAD- CFX PTCA 11/24/13 with plans for DES 11/28/13 11/25/2013  . Hyperlipidemia 06/22/2013  . Prediabetes 06/22/2013  . Vitamin D deficiency 06/22/2013  . Peripheral arterial disease 03/22/2013  . Hypertension   . Mitral regurgitation   . Degenerative arthritis of hip 07/25/2011  . Atrial fibrillation 07/11/2011  . Sleep apnea 07/11/2011    Screening Tests Health Maintenance  Topic Date Due  . ZOSTAVAX  05/04/2005  . INFLUENZA VACCINE  02/19/2015  . PNA vac Low Risk Adult (2 of 2 - PPSV23) 07/12/2015  . COLONOSCOPY  02/13/2020  . TETANUS/TDAP  09/23/2023    Immunization History  Administered Date(s) Administered  . DT 07/22/2003, 12/28/2013  . Influenza Whole 05/19/2013  . Influenza,inj,Quad PF,36+ Mos 05/09/2014  . Pneumococcal Conjugate-13 07/11/2014  . Pneumococcal Polysaccharide-23 12/04/2009  . Tdap 09/22/2013    Preventative care:  Last colonoscopy: 02/13/2015 as above  Past Medical History  Diagnosis Date  . Hyperlipidemia   . Mitral regurgitation   . Bladder cancer   . Hearing loss of both ears   . History of colon polyps     BENIGN  . PAD (peripheral artery disease)     ABI--  RIGHT SIDE NORMAL LEFT SIDE MODERATELY REDUCED W/ DISTAL LEFT SFA STENOSIS//  MILD CALDICATION  . Hypertension   . PAF (paroxysmal atrial fibrillation)      a. s/p PVI 2014; b. s/p redo PVI and CTI 01/2015  . Pre-diabetes   . Vitamin D deficiency   . OSA on CPAP     cpap settign of 13    Past Surgical History  Procedure Laterality Date  . Total hip arthroplasty  07/25/2011    Procedure: TOTAL HIP  ARTHROPLASTY ANTERIOR APPROACH;  Surgeon: Mcarthur Rossetti;  Location: [facility];  Service: Orthopedics;  Laterality: Right;  . Tee without cardioversion  07/26/2012    Procedure: TRANSESOPHAGEAL ECHOCARDIOGRAM (TEE);  Surgeon: [name], MD;  Location: [facility] ENDOSCOPY;  Service: Cardiovascular;  Laterality: N/A;  . Cardioversion  08/06/2012    Procedure: CARDIOVERSION;  Surgeon: [name], MD;  Location: [place];  Service: Cardiovascular;  Laterality: N/A;  . Total hip arthroplasty Left 02-08-2010  . Cataract extraction w/ intraocular lens  implant, bilateral    . Cardiac electrophysiology study and ablation  07-27-2012  DR ALLRED    SUCCESSFUL ABLATION OF A-FIB  . Tonsillectomy  age 67  . Cystoscopy w/ retrogrades Bilateral 04/12/2013    Procedure: CYSTOSCOPY WITH RETROGRADE PYELOGRAM;  Surgeon: [name], MD;  Location: [facility];  Service: Urology;  Laterality: Bilateral;  . Transurethral resection of bladder tumor with gyrus (turbt-gyrus) N/A 04/12/2013    Procedure: TRANSURETHRAL RESECTION OF BLADDER TUMOR WITH GYRUS (TURBT-GYRUS);  Surgeon: [name], MD;  Location: [facility];  Service: Urology;  Laterality: N/A;  . Atrial fibrillation ablation N/A 07/27/2012    Procedure: ATRIAL FIBRILLATION ABLATION;  Surgeon: [name], MD;  Location: [facility] CATH LAB;  Service: Cardiovascular;  Laterality: N/A;  . Left heart catheterization with coronary angiogram N/A 11/24/2013    Procedure: LEFT HEART CATHETERIZATION WITH CORONARY ANGIOGRAM;  Surgeon: [name], MD;  Location: [facility] CATH LAB;  Service: Cardiovascular;  Laterality: N/A;  . Percutaneous coronary stent intervention (pci-s)  11/24/2013    Procedure: PERCUTANEOUS CORONARY STENT INTERVENTION (PCI-S);  Surgeon: [name], MD;  Location: [facility] CATH LAB;  Service: Cardiovascular;;  . Percutaneous coronary rotoblator intervention (pci-r) N/A 11/28/2013    Procedure: PERCUTANEOUS  CORONARY ROTOBLATOR INTERVENTION (PCI-R);  Surgeon: [name], MD;  Location: [facility] CATH LAB;  Service: Cardiovascular;  Laterality: N/A;  . Tee without cardioversion N/A 08/25/2014    Procedure: TRANSESOPHAGEAL ECHOCARDIOGRAM (TEE);  Surgeon: Larey Dresser, MD;  Location: [place];  Service: Cardiovascular;  Laterality: N/A;  . Cardioversion N/A 08/25/2014    Procedure: CARDIOVERSION;  Surgeon: Larey Dresser, MD;  Location: [place];  Service: Cardiovascular;  Laterality: N/A;  . Cardioversion N/A 11/13/2014    Procedure: CARDIOVERSION;  Surgeon: Sueanne Margarita, MD;  Location: [facility] ENDOSCOPY;  Service: Cardiovascular;  Laterality: N/A;  . Cardioversion N/A 12/28/2014    Procedure: CARDIOVERSION;  Surgeon: [name], MD;  Location: [place];  Service: Cardiovascular;  Laterality: N/A;  . Electrophysiologic study N/A 01/26/2015    Procedure: Atrial Fibrillation Ablation;  Surgeon: [name], MD;  Location: [facility]  INVASIVE CV LAB;  Service: Cardiovascular;  Laterality: N/A;  . Tee without cardioversion  01/26/2015    Procedure: Transesophageal Echocardiogram (Tee);  Surgeon: [name], MD;  Location: [place] CV LAB;  Service: Cardiovascular;;  . Ablation of dysrhythmic focus  01/26/2015  . Colonoscopy with propofol N/A 02/13/2015    Procedure: COLONOSCOPY WITH PROPOFOL;  Surgeon: Garlan Fair, MD;  Location: [facility] ENDOSCOPY;  Service: Endoscopy;  Laterality: N/A;    Risk Factors: Tobacco History  Substance Use Topics  . Smoking status: Former Smoker -- 2.00 packs/day for 10 years    Types: Cigarettes    Quit date: 09/20/1981  . Smokeless tobacco: Never Used  . Alcohol Use: No     Comment: pt states he has stopped drinking alcohol   He does not smoke.  Patient is a former smoker. Are there smokers in your home (other than you)?  No  Alcohol Current alcohol use: none  Caffeine Current caffeine use: denies use  Exercise Current exercise: bicycling, walking  and occas golf  Nutrition/Diet Current diet: in general, a "healthy" diet    Cardiac risk factors: advanced age (older than 108 for men, 53 for women), dyslipidemia, hypertension, male gender, obesity (BMI >= 30 kg/m2), sedentary lifestyle and smoking/ tobacco exposure.  Depression Screen (Note: if answer to either of the following is "Yes", a more complete depression screening is indicated)   Q1: Over the past two weeks, have you felt down, depressed or hopeless? No  Q2: Over the past two weeks, have you felt little interest or pleasure in doing things? No  Have you lost interest or pleasure in daily life? No  Do you often feel hopeless? No  Do you cry easily over simple problems? No  Activities of Daily Living In your present state of health, do you have any difficulty performing the following activities?:  Driving? No Managing money?  No Feeding yourself? No Getting from bed to chair? No Climbing a flight of stairs? No Preparing food and eating?: No Bathing or showering? No Getting dressed: No Getting to the toilet? No Using the toilet:No Moving around from place to place: No In the past year have you fallen or had a near fall?:No   Are you sexually active?  Yes  Do you have more than one partner?  No  Vision Difficulties: No  Hearing Difficulties: No Do you often ask people to speak up or repeat themselves? No Do you experience ringing or noises in your ears? No Do you have difficulty understanding soft or whispered voices? No  Cognition  Do you feel that you have a problem with memory?No  Do you often misplace items? No  Do you feel safe at home?  Yes  Advanced directives Does patient have a [place]? No - offered forms   Does patient have a Living Will? No - offered forms  ROS: Constitutional: Denies fever, chills, weight loss/gain, headaches, insomnia, fatigue, night sweats or change in appetite. Eyes: Denies redness, blurred vision,  diplopia, discharge, itchy or watery eyes.  ENT: Denies discharge, congestion, post nasal drip, epistaxis, sore throat, earache, hearing loss, dental pain, Tinnitus, Vertigo, Sinus pain or snoring.  Cardio: Denies chest pain, palpitations, irregular heartbeat, syncope, dyspnea, diaphoresis, orthopnea, PND, claudication or edema Respiratory: denies cough, dyspnea, DOE, pleurisy, hoarseness, laryngitis or wheezing.  Gastrointestinal: Denies dysphagia, heartburn, reflux, water brash, pain, cramps, nausea, vomiting, bloating, diarrhea, constipation, hematemesis, melena, hematochezia, jaundice or hemorrhoids Genitourinary: Denies dysuria, frequency, urgency, nocturia,  hesitancy, discharge, hematuria or flank pain Musculoskeletal: Denies arthralgia, myalgia, stiffness, Jt. Swelling, pain, limp or strain/sprain. Denies Falls. Skin: Denies puritis, rash, hives, warts, acne, eczema or change in skin lesion Neuro: No weakness, tremor, incoordination, spasms, paresthesia or pain Psychiatric: Denies confusion, memory loss or sensory loss. Denies Depression. Endocrine: Denies change in weight, skin, hair change, nocturia, and paresthesia, diabetic polys, visual blurring or hyper / hypo glycemic episodes.  Heme/Lymph: No excessive bleeding, bruising or enlarged lymph nodes.  Objective:     BP 112/74   Pulse 72  Temp 97.7 F   Resp 16  Ht 5' 9.5"   Wt 194 lb 6.4 oz    BMI 28.31   General Appearance:  Alert  WD/WN, male  in no apparent distress. Eyes: PERRLA, EOMs nl, conjunctiva normal, normal fundi and vessels. Sinuses: No frontal/maxillary tenderness ENT/Mouth: EACs patent / TMs  nl. Nares clear without erythema, swelling, mucoid exudates. Oral hygiene is good. No erythema, swelling, or exudate. Tongue normal, non-obstructing. Tonsils not swollen or erythematous. Hearing normal.  Neck: Supple, thyroid normal. No bruits, nodes or JVD. Respiratory: Respiratory effort normal.  BS equal and clear  bilateral without rales, rhonci, wheezing or stridor. Cardio: Heart sounds are normal with regular rate and rhythm and no murmurs, rubs or gallops. Peripheral pulses are normal and equal bilaterally without edema. No aortic or femoral bruits. Chest: symmetric with normal excursions and percussion.  Abdomen: Flat, soft, with nl bowel sounds. Nontender, no guarding, rebound, hernias, masses, or organomegaly.  Lymphatics: Non tender without lymphadenopathy.  Genitourinary: Deferred for recent Colonoscopy and seeds Dr Carrie Mew annually for f/u of Bladder Cancer Musculoskeletal: Full ROM all peripheral extremities, joint stability, 5/5 strength, and normal gait. Skin: Warm and dry without rashes, lesions, cyanosis, clubbing or  ecchymosis.  Neuro: Cranial nerves intact, reflexes equal bilaterally. Normal muscle tone, no cerebellar symptoms. Sensation intact.  Pysch: Alert and oriented X 3 with normal affect, insight and judgment appropriate.   Cognitive Testing  Alert? Yes  Normal Appearance? Yes  Oriented to person? Yes  Place? Yes   Time? Yes  Recall of three objects?  Yes  Can perform simple calculations? Yes  Displays appropriate judgment? Yes  Can read the correct time from a watch/clock? Yes  Medicare Attestation I have personally reviewed: The patient's medical and social history Their use of alcohol, tobacco or illicit drugs Their current medications and supplements The patient's functional ability including ADLs,fall risks, home safety risks, cognitive, and hearing and visual impairment Diet and physical activities Evidence for depression or mood disorders  The patient's weight, height, BMI, and visual acuity have been recorded in the chart.  I have made referrals, counseling, and provided education to the patient based on review of the above and I have provided the patient with a written personalized care plan for preventive services.  Over 40 minutes of exam, counseling, chart review  was performed.  Willman Cuny DAVID, MD   02/16/2015

## 2015-02-17 LAB — MICROALBUMIN / CREATININE URINE RATIO
Creatinine, Urine: 93.4 mg/dL
MICROALB/CREAT RATIO: 3.2 mg/g (ref 0.0–30.0)
Microalb, Ur: 0.3 mg/dL (ref <2.0)

## 2015-02-17 LAB — BASIC METABOLIC PANEL WITH GFR
BUN: 22 mg/dL (ref 7–25)
CHLORIDE: 101 mmol/L (ref 98–110)
CO2: 22 mmol/L (ref 20–31)
CREATININE: 0.87 mg/dL (ref 0.70–1.25)
Calcium: 9.3 mg/dL (ref 8.6–10.3)
GFR, Est Non African American: 88 mL/min (ref >=60)
Glucose, Bld: 93 mg/dL (ref 65–99)
Potassium: 3.9 mmol/L (ref 3.5–5.3)
Sodium: 141 mmol/L (ref 135–146)

## 2015-02-17 LAB — URINALYSIS, MICROSCOPIC ONLY
Bacteria, UA: NONE SEEN [HPF]
CASTS: NONE SEEN [LPF]
CRYSTALS: NONE SEEN [HPF]
RBC / HPF: NONE SEEN RBC/HPF (ref <=2)
SQUAMOUS EPITHELIAL / LPF: NONE SEEN [HPF] (ref <=5)
WBC, UA: NONE SEEN WBC/HPF (ref <=5)
YEAST: NONE SEEN [HPF]

## 2015-02-17 LAB — VITAMIN D 25 HYDROXY (VIT D DEFICIENCY, FRACTURES): Vit D, 25-Hydroxy: 74 ng/mL (ref 30–100)

## 2015-02-17 LAB — HEPATIC FUNCTION PANEL
ALT: 22 U/L (ref 9–46)
AST: 20 U/L (ref 10–35)
Albumin: 4.4 g/dL (ref 3.6–5.1)
Alkaline Phosphatase: 51 U/L (ref 40–115)
Bilirubin, Direct: 0.3 mg/dL — ABNORMAL HIGH (ref <=0.2)
Indirect Bilirubin: 1.2 mg/dL (ref 0.2–1.2)
TOTAL PROTEIN: 6.6 g/dL (ref 6.1–8.1)
Total Bilirubin: 1.5 mg/dL — ABNORMAL HIGH (ref 0.2–1.2)

## 2015-02-17 LAB — MAGNESIUM: MAGNESIUM: 2 mg/dL (ref 1.5–2.5)

## 2015-02-17 LAB — LIPID PANEL
Cholesterol: 166 mg/dL (ref 125–200)
HDL: 58 mg/dL (ref >=40)
LDL CALC: 92 mg/dL (ref <130)
Total CHOL/HDL Ratio: 2.9 Ratio (ref <=5.0)
Triglycerides: 81 mg/dL (ref <150)
VLDL: 16 mg/dL (ref <30)

## 2015-02-17 LAB — INSULIN, RANDOM: INSULIN: 3.9 u[IU]/mL (ref 2.0–19.6)

## 2015-02-17 LAB — PSA: PSA: 0.77 ng/mL (ref <=4.00)

## 2015-02-22 ENCOUNTER — Ambulatory Visit (HOSPITAL_COMMUNITY): Payer: Medicare Other | Admitting: Nurse Practitioner

## 2015-02-23 ENCOUNTER — Inpatient Hospital Stay (HOSPITAL_COMMUNITY): Admit: 2015-02-23 | Payer: Medicare Other | Admitting: Nurse Practitioner

## 2015-03-06 ENCOUNTER — Ambulatory Visit (HOSPITAL_COMMUNITY)
Admission: RE | Admit: 2015-03-06 | Discharge: 2015-03-06 | Disposition: A | Discharge status: Home / Self Care | Payer: Medicare Other | Source: Ambulatory Visit | Attending: Nurse Practitioner | Admitting: Nurse Practitioner

## 2015-03-06 ENCOUNTER — Encounter (HOSPITAL_COMMUNITY): Payer: Self-pay | Admitting: Nurse Practitioner

## 2015-03-06 VITALS — BP 114/72 | HR 64 | Ht 69.0 in | Wt 197.0 lb

## 2015-03-06 DIAGNOSIS — I4819 Other persistent atrial fibrillation: Secondary | ICD-10-CM

## 2015-03-06 DIAGNOSIS — E559 Vitamin D deficiency, unspecified: Secondary | ICD-10-CM | POA: Insufficient documentation

## 2015-03-06 DIAGNOSIS — I481 Persistent atrial fibrillation: Secondary | ICD-10-CM | POA: Insufficient documentation

## 2015-03-06 DIAGNOSIS — Z7982 Long term (current) use of aspirin: Secondary | ICD-10-CM | POA: Insufficient documentation

## 2015-03-06 DIAGNOSIS — R7309 Other abnormal glucose: Secondary | ICD-10-CM | POA: Diagnosis not present

## 2015-03-06 DIAGNOSIS — Z8249 Family history of ischemic heart disease and other diseases of the circulatory system: Secondary | ICD-10-CM | POA: Insufficient documentation

## 2015-03-06 DIAGNOSIS — G4733 Obstructive sleep apnea (adult) (pediatric): Secondary | ICD-10-CM | POA: Insufficient documentation

## 2015-03-06 DIAGNOSIS — Z8551 Personal history of malignant neoplasm of bladder: Secondary | ICD-10-CM | POA: Diagnosis not present

## 2015-03-06 DIAGNOSIS — I10 Essential (primary) hypertension: Secondary | ICD-10-CM | POA: Diagnosis not present

## 2015-03-06 DIAGNOSIS — I739 Peripheral vascular disease, unspecified: Secondary | ICD-10-CM | POA: Insufficient documentation

## 2015-03-06 DIAGNOSIS — E785 Hyperlipidemia, unspecified: Secondary | ICD-10-CM | POA: Insufficient documentation

## 2015-03-06 DIAGNOSIS — Z87891 Personal history of nicotine dependence: Secondary | ICD-10-CM | POA: Diagnosis not present

## 2015-03-06 DIAGNOSIS — Z79899 Other long term (current) drug therapy: Secondary | ICD-10-CM | POA: Diagnosis not present

## 2015-03-06 NOTE — Progress Notes (Signed)
Patient ID: Damon [name], male   DOB: 1946-06-06, 69 y.o.   MRN: [id number]     Primary Care Physician: [name], MD Referring Physician: [name] is a 69 y.o. male with a h/o  persistent afib s/p ablation x2 with last procedure being 01/26/15. He reports no residual effects from procedure. Is doing well without any afib burden. Is taking DOAC on a regular basis.   Today, he denies symptoms of palpitations, chest pain, shortness of breath, orthopnea, PND, lower extremity edema, dizziness, presyncope, syncope, or neurologic sequela. The patient is tolerating medications without difficulties and is otherwise without complaint today.   Past Medical History  Diagnosis Date  . Hyperlipidemia   . Mitral regurgitation   . Bladder cancer   . Hearing loss of both ears   . History of colon polyps     BENIGN  . PAD (peripheral artery disease)     ABI--  RIGHT SIDE NORMAL LEFT SIDE MODERATELY REDUCED W/ DISTAL LEFT SFA STENOSIS//  MILD CALDICATION  . Hypertension   . PAF (paroxysmal atrial fibrillation)      a. s/p PVI 2014; b. s/p redo PVI and CTI 01/2015  . Pre-diabetes   . Vitamin D deficiency   . OSA on CPAP     cpap settign of 13   Past Surgical History  Procedure Laterality Date  . Total hip arthroplasty  07/25/2011    Procedure: TOTAL HIP ARTHROPLASTY ANTERIOR APPROACH;  Surgeon: Mcarthur Rossetti;  [facility]: [facility];  Service: Orthopedics;  Laterality: Right;  . Tee without cardioversion  07/26/2012    Procedure: TRANSESOPHAGEAL ECHOCARDIOGRAM (TEE);  Surgeon: [name], MD;  Location: [facility] ENDOSCOPY;  Service: Cardiovascular;  Laterality: N/A;  . Cardioversion  08/06/2012    Procedure: CARDIOVERSION;  Surgeon: [name], MD;  Location: [place];  Service: Cardiovascular;  Laterality: N/A;  . Total hip arthroplasty Left 02-08-2010  . Cataract extraction w/ intraocular lens  implant, bilateral    . Cardiac electrophysiology study and  ablation  07-27-2012  DR ALLRED    SUCCESSFUL ABLATION OF A-FIB  . Tonsillectomy  age 23  . Cystoscopy w/ retrogrades Bilateral 04/12/2013    Procedure: CYSTOSCOPY WITH RETROGRADE PYELOGRAM;  Surgeon: [name], MD;  Location: [facility];  Service: Urology;  Laterality: Bilateral;  . Transurethral resection of bladder tumor with gyrus (turbt-gyrus) N/A 04/12/2013    Procedure: TRANSURETHRAL RESECTION OF BLADDER TUMOR WITH GYRUS (TURBT-GYRUS);  Surgeon: [name], MD;  Location: [facility];  Service: Urology;  Laterality: N/A;  . Atrial fibrillation ablation N/A 07/27/2012    Procedure: ATRIAL FIBRILLATION ABLATION;  Surgeon: [name], MD;  Location: [facility] CATH LAB;  Service: Cardiovascular;  Laterality: N/A;  . Left heart catheterization with coronary angiogram N/A 11/24/2013    Procedure: LEFT HEART CATHETERIZATION WITH CORONARY ANGIOGRAM;  Surgeon: [name], MD;  Location: [facility] CATH LAB;  Service: Cardiovascular;  Laterality: N/A;  . Percutaneous coronary stent intervention (pci-s)  11/24/2013    Procedure: PERCUTANEOUS CORONARY STENT INTERVENTION (PCI-S);  Surgeon: [name], MD;  Location: [facility] CATH LAB;  Service: Cardiovascular;;  . Percutaneous coronary rotoblator intervention (pci-r) N/A 11/28/2013    Procedure: PERCUTANEOUS CORONARY ROTOBLATOR INTERVENTION (PCI-R);  Surgeon: [name], MD;  Location: [facility] CATH LAB;  Service: Cardiovascular;  Laterality: N/A;  . Tee without cardioversion N/A 08/25/2014    Procedure: TRANSESOPHAGEAL ECHOCARDIOGRAM (TEE);  Surgeon: Larey Dresser, MD;  Location: [facility]  ENDOSCOPY;  Service: Cardiovascular;  Laterality: N/A;  . Cardioversion N/A 08/25/2014    Procedure: CARDIOVERSION;  Surgeon: Larey Dresser, MD;  Location: [place];  Service: Cardiovascular;  Laterality: N/A;  . Cardioversion N/A 11/13/2014    Procedure: CARDIOVERSION;  Surgeon: Sueanne Margarita, MD;  Location: [facility] ENDOSCOPY;   Service: Cardiovascular;  Laterality: N/A;  . Cardioversion N/A 12/28/2014    Procedure: CARDIOVERSION;  Surgeon: [name], MD;  Location: [place];  Service: Cardiovascular;  Laterality: N/A;  . Electrophysiologic study N/A 01/26/2015    Procedure: Atrial Fibrillation Ablation;  Surgeon: [name], MD;  Location: [place] CV LAB;  Service: Cardiovascular;  Laterality: N/A;  . Tee without cardioversion  01/26/2015    Procedure: Transesophageal Echocardiogram (Tee);  Surgeon: [name], MD;  Location: [place] CV LAB;  Service: Cardiovascular;;  . Ablation of dysrhythmic focus  01/26/2015  . Colonoscopy with propofol N/A 02/13/2015    Procedure: COLONOSCOPY WITH PROPOFOL;  Surgeon: Garlan Fair, MD;  [facility] ENDOSCOPY;  Service: Endoscopy;  Laterality: N/A;    Current Outpatient Prescriptions  Medication Sig Dispense Refill  . Ascorbic Acid (VITAMIN C) 1000 MG tablet Take 1,000 mg by mouth daily as needed (vitamin supplement).     Marland Kitchen aspirin EC 81 MG tablet Take 1 tablet (81 mg total) by mouth daily. 90 tablet 3  . bisoprolol-hydrochlorothiazide (ZIAC) 10-6.25 MG per tablet Take 1 tablet by mouth daily. 90 tablet 3  . CARTIA XT 120 MG 24 hr capsule 120 mg daily.     . cholecalciferol (VITAMIN D) 1000 UNITS tablet Take 5,000 Units by mouth daily.     . Multiple Vitamin (MULITIVITAMIN WITH MINERALS) TABS Take 1 tablet by mouth daily. FOR MEN    . pantoprazole (PROTONIX) 40 MG tablet Take 1 tablet (40 mg total) by mouth daily. 45 tablet 0  . rivaroxaban (XARELTO) 20 MG TABS tablet Take 1 tablet (20 mg total) by mouth daily with supper. 30 tablet 11  . rosuvastatin (CRESTOR) 40 MG tablet Take 1 tablet (40 mg total) by mouth daily. (Patient taking differently: Take 20 mg by mouth daily. ) 30 tablet 0  . nitroGLYCERIN (NITROSTAT) 0.4 MG SL tablet Place 1 tablet (0.4 mg total) under the tongue every 5 (five) minutes as needed for chest pain. (Patient not taking: Reported on  03/06/2015) 25 tablet 2   No current facility-administered medications for this encounter.    Allergies  Allergen Reactions  . Zetia [Ezetimibe] Other (See Comments)    myalgias    Social History   Social History  . Marital Status: Married    Spouse Name: N/A  . Number of Children: N/A  . Years of Education: N/A   Occupational History  . Not on file.   Social History Main Topics  . Smoking status: Former Smoker -- 2.00 packs/day for 10 years    Types: Cigarettes    Quit date: 09/20/1981  . Smokeless tobacco: Never Used  . Alcohol Use: No     Comment: pt states he has stopped drinking alcohol  . Drug Use: No  . Sexual Activity: Not on file   Other Topics Concern  . Not on file   Social History Narrative    Family History  Problem Relation Age of Onset  . Heart attack Mother   . Heart disease Mother   . Heart failure Father   . Heart disease Father     ROS- All systems are reviewed and negative  except as per the HPI above  Physical Exam: Filed Vitals:   03/06/15 0853  BP: 114/72  Pulse: 64  Height: 5\' 9"  (1.753 m)  Weight: 197 lb (89.359 kg)    GEN- The patient is well appearing, alert and oriented x 3 today.   Head- normocephalic, atraumatic Eyes-  Sclera clear, conjunctiva pink Ears- hearing intact Oropharynx- clear Neck- supple, no JVP Lymph- no cervical lymphadenopathy Lungs- Clear to ausculation bilaterally, normal work of breathing Heart- Regular rate and rhythm, no murmurs, rubs or gallops, PMI not laterally displaced GI- soft, NT, ND, + BS Extremities- no clubbing, cyanosis, or edema MS- no significant deformity or atrophy Skin- no rash or lesion Psych- euthymic mood, full affect Neuro- strength and sensation are intact  EKG- NSR, normal EKG. HR 64 bpm, Print 154 ms, QRS int 90 ms, QTc 433 m.   Assessment and Plan:  1. Persistent afib Maintaining SR after ablation Continue cardizem, bisoprolol Taking xarelto without missed  doses  2. HTN  Stable  F/u with Dr. [name] as scheduled 10/10.  Geroge Baseman [place], [facility] [address] [place], [place] [address] [phone]

## 2015-03-27 ENCOUNTER — Other Ambulatory Visit: Payer: Self-pay | Admitting: Internal Medicine

## 2015-04-30 ENCOUNTER — Ambulatory Visit (INDEPENDENT_AMBULATORY_CARE_PROVIDER_SITE_OTHER): Payer: Medicare Other | Admitting: Internal Medicine

## 2015-04-30 ENCOUNTER — Encounter: Payer: Self-pay | Admitting: Internal Medicine

## 2015-04-30 VITALS — BP 104/70 | HR 62 | Ht 69.0 in | Wt 198.6 lb

## 2015-04-30 DIAGNOSIS — I4819 Other persistent atrial fibrillation: Secondary | ICD-10-CM

## 2015-04-30 DIAGNOSIS — I25118 Atherosclerotic heart disease of native coronary artery with other forms of angina pectoris: Secondary | ICD-10-CM | POA: Diagnosis not present

## 2015-04-30 DIAGNOSIS — I481 Persistent atrial fibrillation: Secondary | ICD-10-CM | POA: Diagnosis not present

## 2015-04-30 DIAGNOSIS — G4730 Sleep apnea, unspecified: Secondary | ICD-10-CM | POA: Diagnosis not present

## 2015-04-30 DIAGNOSIS — I10 Essential (primary) hypertension: Secondary | ICD-10-CM

## 2015-04-30 NOTE — Progress Notes (Signed)
Electrophysiology Office Note   Date:  04/30/2015   ID:  Damon [name], DOB [birth year], MRN [id number]  PCP:  [name], MD  Cardiologist:  Dr Angelena Form Primary Electrophysiologist: [name], MD    Chief Complaint  Patient presents with  . PAF     History of Present Illness: Damon [name] is a 69 y.o. male who presents today for electrophysiology evaluation.   Doing well at this time without afib off of AADs post ablation.  Denies procedure related complications. Today, he denies symptoms of palpitations, chest pain, shortness of breath, orthopnea, PND, lower extremity edema, claudication, dizziness, presyncope, syncope, bleeding, or neurologic sequela. The patient is tolerating medications without difficulties and is otherwise without complaint today.    Past Medical History  Diagnosis Date  . Hyperlipidemia   . Mitral regurgitation   . Bladder cancer ([place])   . Hearing loss of both ears   . History of colon polyps     BENIGN  . PAD (peripheral artery disease) (HCC)     ABI--  RIGHT SIDE NORMAL LEFT SIDE MODERATELY REDUCED W/ DISTAL LEFT SFA STENOSIS//  MILD CALDICATION  . Hypertension   . PAF (paroxysmal atrial fibrillation) ([place])      a. s/p PVI 2014; b. s/p redo PVI and CTI 01/2015  . Pre-diabetes   . Vitamin D deficiency   . OSA on CPAP     cpap settign of 13   Past Surgical History  Procedure Laterality Date  . Total hip arthroplasty  07/25/2011    Procedure: TOTAL HIP ARTHROPLASTY ANTERIOR APPROACH;  Surgeon: Mcarthur Rossetti;  [facility];  Service: Orthopedics;  Laterality: Right;  . Tee without cardioversion  07/26/2012    Procedure: TRANSESOPHAGEAL ECHOCARDIOGRAM (TEE);  Surgeon: [name], MD;  Location: [facility] ENDOSCOPY;  Service: Cardiovascular;  Laterality: N/A;  . Cardioversion  08/06/2012    Procedure: CARDIOVERSION;  Surgeon: [name], MD;  Location: [place];  Service: Cardiovascular;  Laterality: N/A;  . Total hip  arthroplasty Left 02-08-2010  . Cataract extraction w/ intraocular lens  implant, bilateral    . Cardiac electrophysiology study and ablation  07-27-2012  DR Shelley Pooley    SUCCESSFUL ABLATION OF A-FIB  . Tonsillectomy  age 32  . Cystoscopy w/ retrogrades Bilateral 04/12/2013    Procedure: CYSTOSCOPY WITH RETROGRADE PYELOGRAM;  Surgeon: [name], MD;  Location: [facility];  Service: Urology;  Laterality: Bilateral;  . Transurethral resection of bladder tumor with gyrus (turbt-gyrus) N/A 04/12/2013    Procedure: TRANSURETHRAL RESECTION OF BLADDER TUMOR WITH GYRUS (TURBT-GYRUS);  Surgeon: [name], MD;  Location: [facility];  Service: Urology;  Laterality: N/A;  . Atrial fibrillation ablation N/A 07/27/2012    Procedure: ATRIAL FIBRILLATION ABLATION;  Surgeon: [name], MD;  Location: [facility] CATH LAB;  Service: Cardiovascular;  Laterality: N/A;  . Left heart catheterization with coronary angiogram N/A 11/24/2013    Procedure: LEFT HEART CATHETERIZATION WITH CORONARY ANGIOGRAM;  Surgeon: [name], MD;  Location: [facility] CATH LAB;  Service: Cardiovascular;  Laterality: N/A;  . Percutaneous coronary stent intervention (pci-s)  11/24/2013    Procedure: PERCUTANEOUS CORONARY STENT INTERVENTION (PCI-S);  Surgeon: [name], MD;  Location: [facility] CATH LAB;  Service: Cardiovascular;;  . Percutaneous coronary rotoblator intervention (pci-r) N/A 11/28/2013    Procedure: PERCUTANEOUS CORONARY ROTOBLATOR INTERVENTION (PCI-R);  Surgeon: [name], MD;  Location: [facility] CATH LAB;  Service: Cardiovascular;  Laterality: N/A;  . Tee without cardioversion  N/A 08/25/2014    Procedure: TRANSESOPHAGEAL ECHOCARDIOGRAM (TEE);  Surgeon: Larey Dresser, MD;  Location: [place];  Service: Cardiovascular;  Laterality: N/A;  . Cardioversion N/A 08/25/2014    Procedure: CARDIOVERSION;  Surgeon: Larey Dresser, MD;  Location: [place];  Service: Cardiovascular;   Laterality: N/A;  . Cardioversion N/A 11/13/2014    Procedure: CARDIOVERSION;  Surgeon: Sueanne Margarita, MD;  Location: [facility] ENDOSCOPY;  Service: Cardiovascular;  Laterality: N/A;  . Cardioversion N/A 12/28/2014    Procedure: CARDIOVERSION;  Surgeon: [name], MD;  Location: [place];  Service: Cardiovascular;  Laterality: N/A;  . Electrophysiologic study N/A 01/26/2015    Procedure: Atrial Fibrillation Ablation;  Surgeon: [name], MD;  Location: [place] CV LAB;  Service: Cardiovascular;  Laterality: N/A;  . Tee without cardioversion  01/26/2015    Procedure: Transesophageal Echocardiogram (Tee);  Surgeon: [name], MD;  Location: [place] CV LAB;  Service: Cardiovascular;;  . Ablation of dysrhythmic focus  01/26/2015  . Colonoscopy with propofol N/A 02/13/2015    Procedure: COLONOSCOPY WITH PROPOFOL;  Surgeon: Garlan Fair, MD;  [facility];  Service: Endoscopy;  Laterality: N/A;     Current Outpatient Prescriptions  Medication Sig Dispense Refill  . Ascorbic Acid (VITAMIN C) 1000 MG tablet Take 1,000 mg by mouth daily as needed (vitamin supplement).     Marland Kitchen aspirin EC 81 MG tablet Take 1 tablet (81 mg total) by mouth daily. 90 tablet 3  . bisoprolol-hydrochlorothiazide (ZIAC) 10-6.25 MG per tablet TAKE ONE TABLET BY MOUTH ONCE DAILY 90 tablet 1  . Multiple Vitamin (MULITIVITAMIN WITH MINERALS) TABS Take 1 tablet by mouth daily. FOR MEN    . nitroGLYCERIN (NITROSTAT) 0.4 MG SL tablet Place 1 tablet (0.4 mg total) under the tongue every 5 (five) minutes as needed for chest pain. 25 tablet 2  . pantoprazole (PROTONIX) 40 MG tablet Take 1 tablet (40 mg total) by mouth daily. 45 tablet 0  . rivaroxaban (XARELTO) 20 MG TABS tablet Take 1 tablet (20 mg total) by mouth daily with supper. 30 tablet 11  . rosuvastatin (CRESTOR) 40 MG tablet Take 1 tablet (40 mg total) by mouth daily. (Patient taking differently: Take 20 mg by mouth daily. ) 30 tablet 0  . VITAMIN D,  ERGOCALCIFEROL, PO Take 5,000 Units by mouth daily. Take 1 tablet daily     No current facility-administered medications for this visit.    Allergies:   Zetia   Social History:  The patient  reports that he quit smoking about 33 years ago. His smoking use included Cigarettes. He has a 20 pack-year smoking history. He has never used smokeless tobacco. He reports that he does not drink alcohol or use illicit drugs.   Family History:  The patient's  family history includes Heart attack in his mother; Heart disease in his father and mother; Heart failure in his father.    ROS:  Please see the history of present illness.   All other systems are reviewed and negative.    PHYSICAL EXAM: VS:  BP 104/70 mmHg  Pulse 62  Ht 5\' 9"  (1.753 m)  Wt 198 lb 9.6 oz (90.084 kg)  BMI 29.31 kg/m2 , BMI Body mass index is 29.31 kg/(m^2). GEN: Well nourished, well developed, in no acute distress HEENT: normal Neck: no JVD, carotid bruits, or masses Cardiac: RRR; no murmurs, rubs, or gallops,no edema  Respiratory:  clear to auscultation bilaterally, normal work of breathing GI: soft, nontender, nondistended, +  BS MS: no deformity or atrophy Skin: warm and dry  Neuro:  Strength and sensation are intact Psych: euthymic mood, full affect  EKG:  EKG is ordered today. The ekg ordered today shows sinus rhythm   Recent Labs: 02/16/2015: ALT 22; BUN 22; Creat 0.87; Hemoglobin 15.2; Magnesium 2.0; Platelets 207; Potassium 3.9; Sodium 141; TSH 0.954    Lipid Panel     Component Value Date/Time   CHOL 166 02/16/2015 1201   TRIG 81 02/16/2015 1201   HDL 58 02/16/2015 1201   CHOLHDL 2.9 02/16/2015 1201   VLDL 16 02/16/2015 1201   LDLCALC 92 02/16/2015 1201     Wt Readings from Last 3 Encounters:  04/30/15 198 lb 9.6 oz (90.084 kg)  03/06/15 197 lb (89.359 kg)  02/16/15 194 lb 6.4 oz (88.179 kg)      ASSESSMENT AND PLAN:  1.  Persistent atrial fibrillation Doing well off of AAD post ablation.      Continue anticoagulation for chads2vasc score of 3. Stop diltiazem at this time  2. CAD Doing well Dr Angelena Form has recently stopped plavix.  Now on asa and xarelto  3. OSA Compliance is encouraged  4. HTN Reports "low BP" recently. If BP remains well controlled off of diltiazem, he can stop [facility]   Current medicines are reviewed at length with the patient today.   The patient does not have concerns regarding his medicines.  The following changes were made today:  none  Follow-up with Roderic Palau NP in the AF clinic every 3 months and I will see when needed Follow-up with Dr Angelena Form as scheduled  Signed, [name], MD  04/30/2015 11:57 AM     [place] [address] [phone] (office) [phone] (fax)

## 2015-04-30 NOTE — Patient Instructions (Addendum)
Medication Instructions:  Your physician has recommended you make the following change in your medication:  1) STOP Diltiazem  - monitor your blood pressure over the next several weeks.  If blood pressure is normal at the end of two weeks you may stop Ziac.  Labwork: None ordered  Testing/Procedures: None  ordered  Follow-Up: Your physician recommends that you schedule a follow-up appointment in: 3 months with Roderic Palau, NP.   Any Other Special Instructions Will Be Listed Below (If Applicable). Thank you for choosing [facility]!!

## 2015-05-22 ENCOUNTER — Ambulatory Visit: Payer: Self-pay | Admitting: Internal Medicine

## 2015-06-10 ENCOUNTER — Encounter: Payer: Self-pay | Admitting: *Deleted

## 2015-06-12 ENCOUNTER — Encounter: Payer: Self-pay | Admitting: Internal Medicine

## 2015-06-12 ENCOUNTER — Ambulatory Visit (INDEPENDENT_AMBULATORY_CARE_PROVIDER_SITE_OTHER): Payer: Medicare Other | Admitting: Internal Medicine

## 2015-06-12 VITALS — BP 140/82 | HR 70 | Temp 98.2°F | Resp 16 | Ht 69.5 in | Wt 204.0 lb

## 2015-06-12 DIAGNOSIS — R7303 Prediabetes: Secondary | ICD-10-CM

## 2015-06-12 DIAGNOSIS — E782 Mixed hyperlipidemia: Secondary | ICD-10-CM

## 2015-06-12 DIAGNOSIS — I10 Essential (primary) hypertension: Secondary | ICD-10-CM

## 2015-06-12 DIAGNOSIS — I481 Persistent atrial fibrillation: Secondary | ICD-10-CM | POA: Diagnosis not present

## 2015-06-12 DIAGNOSIS — E559 Vitamin D deficiency, unspecified: Secondary | ICD-10-CM

## 2015-06-12 DIAGNOSIS — Z79899 Other long term (current) drug therapy: Secondary | ICD-10-CM | POA: Diagnosis not present

## 2015-06-12 DIAGNOSIS — I4819 Other persistent atrial fibrillation: Secondary | ICD-10-CM

## 2015-06-12 LAB — BASIC METABOLIC PANEL WITH GFR
BUN: 9 mg/dL (ref 7–25)
CHLORIDE: 108 mmol/L (ref 98–110)
CO2: 25 mmol/L (ref 20–31)
CREATININE: 0.81 mg/dL (ref 0.70–1.18)
Calcium: 9.1 mg/dL (ref 8.6–10.3)
GFR, Est African American: >89 mL/min (ref >=60)
GFR, Est Non African American: >89 mL/min (ref >=60)
GLUCOSE: 86 mg/dL (ref 65–99)
POTASSIUM: 4 mmol/L (ref 3.5–5.3)
Sodium: 142 mmol/L (ref 135–146)

## 2015-06-12 LAB — HEPATIC FUNCTION PANEL
ALBUMIN: 3.9 g/dL (ref 3.6–5.1)
ALT: 25 U/L (ref 9–46)
AST: 19 U/L (ref 10–35)
Alkaline Phosphatase: 57 U/L (ref 40–115)
Bilirubin, Direct: 0.2 mg/dL (ref <=0.2)
Indirect Bilirubin: 1 mg/dL (ref 0.2–1.2)
Total Bilirubin: 1.2 mg/dL (ref 0.2–1.2)
Total Protein: 6 g/dL — ABNORMAL LOW (ref 6.1–8.1)

## 2015-06-12 LAB — CBC WITH DIFFERENTIAL/PLATELET
BASOS ABS: 0.1 10*3/uL (ref 0.0–0.1)
Basophils Relative: 1 % (ref 0–1)
Eosinophils Absolute: 0.2 10*3/uL (ref 0.0–0.7)
Eosinophils Relative: 3 % (ref 0–5)
HEMATOCRIT: 44.1 % (ref 39.0–52.0)
HEMOGLOBIN: 14.8 g/dL (ref 13.0–17.0)
LYMPHS PCT: 21 % (ref 12–46)
Lymphs Abs: 1.3 10*3/uL (ref 0.7–4.0)
MCH: 30 pg (ref 26.0–34.0)
MCHC: 33.6 g/dL (ref 30.0–36.0)
MCV: 89.5 fL (ref 78.0–100.0)
MONOS PCT: 8 % (ref 3–12)
MPV: 9.2 fL (ref 8.6–12.4)
Monocytes Absolute: 0.5 10*3/uL (ref 0.1–1.0)
NEUTROS PCT: 67 % (ref 43–77)
Neutro Abs: 4.2 10*3/uL (ref 1.7–7.7)
Platelets: 210 10*3/uL (ref 150–400)
RBC: 4.93 MIL/uL (ref 4.22–5.81)
RDW: 13 % (ref 11.5–15.5)
WBC: 6.2 10*3/uL (ref 4.0–10.5)

## 2015-06-12 LAB — LIPID PANEL
Cholesterol: 136 mg/dL (ref 125–200)
HDL: 66 mg/dL (ref >=40)
LDL Cholesterol: 58 mg/dL (ref <130)
Total CHOL/HDL Ratio: 2.1 Ratio (ref <=5.0)
Triglycerides: 58 mg/dL (ref <150)
VLDL: 12 mg/dL (ref <30)

## 2015-06-12 LAB — HEMOGLOBIN A1C
HEMOGLOBIN A1C: 5.4 % (ref <5.7)
MEAN PLASMA GLUCOSE: 108 mg/dL (ref <117)

## 2015-06-12 LAB — TSH: TSH: 0.739 u[IU]/mL (ref 0.350–4.500)

## 2015-06-12 NOTE — Progress Notes (Signed)
Patient ID: RYAAN BATTAGLIA, male   DOB: [birth year], 69 y.o.   MRN: [id number]  Assessment and Plan:  Hypertension:  -BP good, stay off diltiazem -Continue medication,  -monitor blood pressure at home.  -Continue DASH diet.   -Reminder to go to the ER if any CP, SOB, nausea, dizziness, severe HA, changes vision/speech, left arm numbness and tingling, and jaw pain.  Cholesterol: -Continue diet and exercise.  -Check cholesterol.   Pre-diabetes: -Continue diet and exercise.  -Check A1C  Vitamin D Def: -check level -continue medications.   All vaccines up to date.  Patient declined zostavax.  Continue diet and meds as discussed. Further disposition pending results of labs.  HPI 69 y.o. male  presents for 3 month follow up with hypertension, hyperlipidemia, prediabetes and vitamin D.   His blood pressure has been controlled at home, today their BP is BP: 140/82 mmHg.   He does workout. He denies chest pain, shortness of breath, dizziness.  He is riding a bike, walking, and playing golf.     He is on cholesterol medication and denies myalgias. His cholesterol is at goal. The cholesterol last visit was:   Lab Results  Component Value Date   CHOL 166 02/16/2015   HDL 58 02/16/2015   LDLCALC 92 02/16/2015   TRIG 81 02/16/2015   CHOLHDL 2.9 02/16/2015  Occasional leg cramps every once in while.     He has been working on diet and exercise for prediabetes, and denies foot ulcerations, hyperglycemia, hypoglycemia , increased appetite, nausea, paresthesia of the feet, polydipsia, polyuria, visual disturbances, vomiting and weight loss. Last A1C in the office was:  Lab Results  Component Value Date   HGBA1C 5.5 02/16/2015    Patient is on Vitamin D supplement.  Lab Results  Component Value Date   VD25OH 74 02/16/2015      Current Medications:  Current Outpatient Prescriptions on File Prior to Visit  Medication Sig Dispense Refill  . Ascorbic Acid (VITAMIN C) 1000 MG tablet  Take 1,000 mg by mouth daily as needed (vitamin supplement).     Marland Kitchen aspirin EC 81 MG tablet Take 1 tablet (81 mg total) by mouth daily. 90 tablet 3  . bisoprolol-hydrochlorothiazide (ZIAC) 10-6.25 MG per tablet TAKE ONE TABLET BY MOUTH ONCE DAILY 90 tablet 1  . Multiple Vitamin (MULITIVITAMIN WITH MINERALS) TABS Take 1 tablet by mouth daily. FOR MEN    . nitroGLYCERIN (NITROSTAT) 0.4 MG SL tablet Place 1 tablet (0.4 mg total) under the tongue every 5 (five) minutes as needed for chest pain. 25 tablet 2  . rivaroxaban (XARELTO) 20 MG TABS tablet Take 1 tablet (20 mg total) by mouth daily with supper. 30 tablet 11  . rosuvastatin (CRESTOR) 40 MG tablet Take 1 tablet (40 mg total) by mouth daily. (Patient taking differently: Take 20 mg by mouth daily. ) 30 tablet 0  . VITAMIN D, ERGOCALCIFEROL, PO Take 5,000 Units by mouth daily. Take 1 tablet daily     No current facility-administered medications on file prior to visit.    Medical History:  Past Medical History  Diagnosis Date  . Hyperlipidemia   . Mitral regurgitation   . Bladder cancer ([place])   . Hearing loss of both ears   . History of colon polyps     BENIGN  . PAD (peripheral artery disease) ([facility])     ABI--  RIGHT SIDE NORMAL LEFT SIDE MODERATELY REDUCED W/ DISTAL LEFT SFA STENOSIS//  MILD CALDICATION  . Hypertension   .  PAF (paroxysmal atrial fibrillation) ([place])      a. s/p PVI 2014; b. s/p redo PVI and CTI 01/2015  . Pre-diabetes   . Vitamin D deficiency   . OSA on CPAP     cpap settign of 13    Allergies:  Allergies  Allergen Reactions  . Zetia [Ezetimibe] Other (See Comments)    Other reaction(s): Other (See Comments) myalgias myalgias     Review of Systems:  Review of Systems  Constitutional: Negative for fever, chills and malaise/fatigue.  HENT: Negative for congestion, ear pain and sore throat.   Respiratory: Negative for cough, shortness of breath and wheezing.   Cardiovascular: Negative for chest pain,  palpitations and leg swelling.  Gastrointestinal: Negative for heartburn, diarrhea, constipation, blood in stool and melena.  Genitourinary: Negative for dysuria, urgency, frequency and hematuria.  Neurological: Negative for dizziness, sensory change, loss of consciousness and headaches.  Psychiatric/Behavioral: Negative for depression. The patient is not nervous/anxious and does not have insomnia.     Family history- Review and unchanged  Social history- Review and unchanged  Physical Exam: BP 140/82 mmHg  Pulse 70  Temp(Src) 98.2 F (36.8 C) (Temporal)  Resp 16  Ht 5' 9.5" (1.765 m)  Wt 204 lb (92.534 kg)  BMI 29.70 kg/m2 Wt Readings from Last 3 Encounters:  06/12/15 204 lb (92.534 kg)  04/30/15 198 lb 9.6 oz (90.084 kg)  03/06/15 197 lb (89.359 kg)    General Appearance: Well nourished well developed, in no apparent distress. Eyes: PERRLA, EOMs, conjunctiva no swelling or erythema ENT/Mouth: Ear canals normal without obstruction, swelling, erythma, discharge.  TMs normal bilaterally.  Oropharynx moist, clear, without exudate, or postoropharyngeal swelling. Neck: Supple, thyroid normal,no cervical adenopathy  Respiratory: Respiratory effort normal, Breath sounds clear A&P without rhonchi, wheeze, or rale.  No retractions, no accessory usage. Cardio: RRR with no MRGs. Brisk peripheral pulses without edema.  Abdomen: Soft, + BS,  Non tender, no guarding, rebound, hernias, masses. Musculoskeletal: Full ROM, 5/5 strength, Normal gait Skin: Warm, dry without rashes, lesions, ecchymosis.  Neuro: Awake and oriented X 3, Cranial nerves intact. Normal muscle tone, no cerebellar symptoms. Psych: Normal affect, Insight and Judgment appropriate.    Starlyn Skeans, PA-C 9:43 AM [facility]

## 2015-07-16 ENCOUNTER — Encounter: Payer: Self-pay | Admitting: *Deleted

## 2015-07-25 ENCOUNTER — Ambulatory Visit (HOSPITAL_COMMUNITY)
Admission: RE | Admit: 2015-07-25 | Discharge: 2015-07-25 | Disposition: A | Discharge status: Home / Self Care | Payer: Medicare Other | Source: Ambulatory Visit | Attending: Nurse Practitioner | Admitting: Nurse Practitioner

## 2015-07-25 ENCOUNTER — Encounter (HOSPITAL_COMMUNITY): Payer: Self-pay | Admitting: Nurse Practitioner

## 2015-07-25 VITALS — BP 128/84 | HR 65 | Ht 69.0 in | Wt 207.6 lb

## 2015-07-25 DIAGNOSIS — I481 Persistent atrial fibrillation: Secondary | ICD-10-CM | POA: Diagnosis present

## 2015-07-25 DIAGNOSIS — I4819 Other persistent atrial fibrillation: Secondary | ICD-10-CM

## 2015-07-25 NOTE — Progress Notes (Signed)
Patient ID: Damon [name], male   DOB: [birth year], 70 y.o.   MRN: [id number]     Primary Care Physician: [name], MD Referring Physician: [name] is a 70 y.o. male with a h/o persistent afib s/p ablation 7/16. He continues in SR, no afib to speak of other than a few flutters intermittently. He continues on xarelto. He has no complaints.  Today, he denies symptoms of palpitations, chest pain, shortness of breath, orthopnea, PND, lower extremity edema, dizziness, presyncope, syncope, or neurologic sequela. The patient is tolerating medications without difficulties and is otherwise without complaint today.   Past Medical History  Diagnosis Date  . Hyperlipidemia   . Mitral regurgitation   . Bladder cancer ([place])   . Hearing loss of both ears   . History of colon polyps     BENIGN  . PAD (peripheral artery disease) (HCC)     ABI--  RIGHT SIDE NORMAL LEFT SIDE MODERATELY REDUCED W/ DISTAL LEFT SFA STENOSIS//  MILD CALDICATION  . Hypertension   . PAF (paroxysmal atrial fibrillation) ([place])      a. s/p PVI 2014; b. s/p redo PVI and CTI 01/2015  . Pre-diabetes   . Vitamin D deficiency   . OSA on CPAP     cpap settign of 13   Past Surgical History  Procedure Laterality Date  . Total hip arthroplasty  07/25/2011    Procedure: TOTAL HIP ARTHROPLASTY ANTERIOR APPROACH;  Surgeon: Mcarthur Rossetti;  [facility]: [facility];  Service: Orthopedics;  Laterality: Right;  . Tee without cardioversion  07/26/2012    Procedure: TRANSESOPHAGEAL ECHOCARDIOGRAM (TEE);  Surgeon: [name], MD;  Location: [facility] ENDOSCOPY;  Service: Cardiovascular;  Laterality: N/A;  . Cardioversion  08/06/2012    Procedure: CARDIOVERSION;  Surgeon: [name], MD;  Location: [place];  Service: Cardiovascular;  Laterality: N/A;  . Total hip arthroplasty Left 02-08-2010  . Cataract extraction w/ intraocular lens  implant, bilateral    . Cardiac electrophysiology study and ablation   07-27-2012  DR ALLRED    SUCCESSFUL ABLATION OF A-FIB  . Tonsillectomy  age 77  . Cystoscopy w/ retrogrades Bilateral 04/12/2013    Procedure: CYSTOSCOPY WITH RETROGRADE PYELOGRAM;  Surgeon: [name], MD;  Location: [facility];  Service: Urology;  Laterality: Bilateral;  . Transurethral resection of bladder tumor with gyrus (turbt-gyrus) N/A 04/12/2013    Procedure: TRANSURETHRAL RESECTION OF BLADDER TUMOR WITH GYRUS (TURBT-GYRUS);  Surgeon: [name], MD;  Location: [facility];  Service: Urology;  Laterality: N/A;  . Atrial fibrillation ablation N/A 07/27/2012    Procedure: ATRIAL FIBRILLATION ABLATION;  Surgeon: [name], MD;  Location: [facility] CATH LAB;  Service: Cardiovascular;  Laterality: N/A;  . Left heart catheterization with coronary angiogram N/A 11/24/2013    Procedure: LEFT HEART CATHETERIZATION WITH CORONARY ANGIOGRAM;  Surgeon: [name], MD;  Location: [facility] CATH LAB;  Service: Cardiovascular;  Laterality: N/A;  . Percutaneous coronary stent intervention (pci-s)  11/24/2013    Procedure: PERCUTANEOUS CORONARY STENT INTERVENTION (PCI-S);  Surgeon: [name], MD;  Location: [facility] CATH LAB;  Service: Cardiovascular;;  . Percutaneous coronary rotoblator intervention (pci-r) N/A 11/28/2013    Procedure: PERCUTANEOUS CORONARY ROTOBLATOR INTERVENTION (PCI-R);  Surgeon: [name], MD;  Location: [facility] CATH LAB;  Service: Cardiovascular;  Laterality: N/A;  . Tee without cardioversion N/A 08/25/2014    Procedure: TRANSESOPHAGEAL ECHOCARDIOGRAM (TEE);  Surgeon: Larey Dresser, MD;  Location: [place];  Service: Cardiovascular;  Laterality: N/A;  . Cardioversion N/A 08/25/2014    Procedure: CARDIOVERSION;  Surgeon: Larey Dresser, MD;  Location: [place];  Service: Cardiovascular;  Laterality: N/A;  . Cardioversion N/A 11/13/2014    Procedure: CARDIOVERSION;  Surgeon: Sueanne Margarita, MD;  Location: [facility] ENDOSCOPY;  Service:  Cardiovascular;  Laterality: N/A;  . Cardioversion N/A 12/28/2014    Procedure: CARDIOVERSION;  Surgeon: [name], MD;  Location: [place];  Service: Cardiovascular;  Laterality: N/A;  . Electrophysiologic study N/A 01/26/2015    Procedure: Atrial Fibrillation Ablation;  Surgeon: [name], MD;  Location: [place] CV LAB;  Service: Cardiovascular;  Laterality: N/A;  . Tee without cardioversion  01/26/2015    Procedure: Transesophageal Echocardiogram (Tee);  Surgeon: [name], MD;  Location: [place] CV LAB;  Service: Cardiovascular;;  . Ablation of dysrhythmic focus  01/26/2015  . Colonoscopy with propofol N/A 02/13/2015    Procedure: COLONOSCOPY WITH PROPOFOL;  Surgeon: Garlan Fair, MD;  [facility];  Service: Endoscopy;  Laterality: N/A;    Current Outpatient Prescriptions  Medication Sig Dispense Refill  . Ascorbic Acid (VITAMIN C) 1000 MG tablet Take 1,000 mg by mouth daily as needed (vitamin supplement).     Marland Kitchen aspirin EC 81 MG tablet Take 1 tablet (81 mg total) by mouth daily. 90 tablet 3  . bisoprolol-hydrochlorothiazide (ZIAC) 10-6.25 MG per tablet TAKE ONE TABLET BY MOUTH ONCE DAILY 90 tablet 1  . Multiple Vitamin (MULITIVITAMIN WITH MINERALS) TABS Take 1 tablet by mouth daily. FOR MEN    . rivaroxaban (XARELTO) 20 MG TABS tablet Take 1 tablet (20 mg total) by mouth daily with supper. 30 tablet 11  . rosuvastatin (CRESTOR) 40 MG tablet Take 1 tablet (40 mg total) by mouth daily. (Patient taking differently: Take 20 mg by mouth daily. ) 30 tablet 0  . VITAMIN D, ERGOCALCIFEROL, PO Take 5,000 Units by mouth daily. Take 1 tablet daily    . nitroGLYCERIN (NITROSTAT) 0.4 MG SL tablet Place 1 tablet (0.4 mg total) under the tongue every 5 (five) minutes as needed for chest pain. (Patient not taking: Reported on 07/25/2015) 25 tablet 2   No current facility-administered medications for this encounter.    Allergies  Allergen Reactions  . Zetia [Ezetimibe]  Other (See Comments)    Other reaction(s): Other (See Comments) myalgias myalgias    Social History   Social History  . Marital Status: Married    Spouse Name: N/A  . Number of Children: N/A  . Years of Education: N/A   Occupational History  . Not on file.   Social History Main Topics  . Smoking status: Former Smoker -- 2.00 packs/day for 10 years    Types: Cigarettes    Quit date: 09/20/1981  . Smokeless tobacco: Never Used  . Alcohol Use: No     Comment: pt states he has stopped drinking alcohol  . Drug Use: No  . Sexual Activity: Not on file   Other Topics Concern  . Not on file   Social History Narrative    Family History  Problem Relation Age of Onset  . Heart attack Mother   . Heart disease Mother   . Heart failure Father   . Heart disease Father     ROS- All systems are reviewed and negative except as per the HPI above  Physical Exam: Filed Vitals:   07/25/15 1014  BP: 128/84  Pulse: 65  Height: 5\' 9"  (1.753 m)  Weight: 207 lb 9.6 oz (94.167 kg)    GEN- The patient is well appearing, alert and oriented x 3 today.   Head- normocephalic, atraumatic Eyes-  Sclera clear, conjunctiva pink Ears- hearing intact Oropharynx- clear Neck- supple, no JVP Lymph- no cervical lymphadenopathy Lungs- Clear to ausculation bilaterally, normal work of breathing Heart- Regular rate and rhythm, no murmurs, rubs or gallops, PMI not laterally displaced GI- soft, NT, ND, + BS Extremities- no clubbing, cyanosis, or edema MS- no significant deformity or atrophy Skin- no rash or lesion Psych- euthymic mood, full affect Neuro- strength and sensation are intact  EKG- NSR, normal EKG, pr int 148 ms, qrs duration 90 ms, qtc 436 ms. Epic records reviewed  Assessment and Plan: 1. afib  Staying in SR s/p ablation Doing well on DOAC without bleeding issues. Continues on bisoprolol/hctz with controlled BP  F/u Dr. [name] in 3 months  Geroge Baseman. Trace Wirick, [facility] [address] [place], [place] [address] [phone]

## 2015-07-31 ENCOUNTER — Ambulatory Visit (HOSPITAL_COMMUNITY): Payer: Medicare Other | Admitting: Nurse Practitioner

## 2015-08-22 ENCOUNTER — Other Ambulatory Visit: Payer: Self-pay | Admitting: Internal Medicine

## 2015-09-04 ENCOUNTER — Encounter: Payer: Self-pay | Admitting: Internal Medicine

## 2015-09-04 ENCOUNTER — Ambulatory Visit (INDEPENDENT_AMBULATORY_CARE_PROVIDER_SITE_OTHER): Payer: Medicare Other | Admitting: Internal Medicine

## 2015-09-04 VITALS — BP 132/84 | HR 72 | Temp 97.6°F | Resp 16 | Ht 69.5 in | Wt 207.2 lb

## 2015-09-04 DIAGNOSIS — Z79899 Other long term (current) drug therapy: Secondary | ICD-10-CM

## 2015-09-04 DIAGNOSIS — I10 Essential (primary) hypertension: Secondary | ICD-10-CM | POA: Diagnosis not present

## 2015-09-04 DIAGNOSIS — E782 Mixed hyperlipidemia: Secondary | ICD-10-CM

## 2015-09-04 DIAGNOSIS — I480 Paroxysmal atrial fibrillation: Secondary | ICD-10-CM

## 2015-09-04 DIAGNOSIS — E559 Vitamin D deficiency, unspecified: Secondary | ICD-10-CM

## 2015-09-04 DIAGNOSIS — R7303 Prediabetes: Secondary | ICD-10-CM | POA: Diagnosis not present

## 2015-09-04 LAB — CBC WITH DIFFERENTIAL/PLATELET
BASOS PCT: 1 % (ref 0–1)
Basophils Absolute: 0.1 10*3/uL (ref 0.0–0.1)
Eosinophils Absolute: 0.3 10*3/uL (ref 0.0–0.7)
Eosinophils Relative: 5 % (ref 0–5)
HEMATOCRIT: 45.6 % (ref 39.0–52.0)
HEMOGLOBIN: 15.5 g/dL (ref 13.0–17.0)
LYMPHS PCT: 24 % (ref 12–46)
Lymphs Abs: 1.4 10*3/uL (ref 0.7–4.0)
MCH: 29.6 pg (ref 26.0–34.0)
MCHC: 34 g/dL (ref 30.0–36.0)
MCV: 87.2 fL (ref 78.0–100.0)
MONOS PCT: 11 % (ref 3–12)
MPV: 9.4 fL (ref 8.6–12.4)
Monocytes Absolute: 0.7 10*3/uL (ref 0.1–1.0)
NEUTROS ABS: 3.5 10*3/uL (ref 1.7–7.7)
NEUTROS PCT: 59 % (ref 43–77)
Platelets: 191 10*3/uL (ref 150–400)
RBC: 5.23 MIL/uL (ref 4.22–5.81)
RDW: 13.4 % (ref 11.5–15.5)
WBC: 6 10*3/uL (ref 4.0–10.5)

## 2015-09-04 LAB — BASIC METABOLIC PANEL WITH GFR
BUN: 19 mg/dL (ref 7–25)
CHLORIDE: 107 mmol/L (ref 98–110)
CO2: 26 mmol/L (ref 20–31)
CREATININE: 0.93 mg/dL (ref 0.70–1.18)
Calcium: 9.6 mg/dL (ref 8.6–10.3)
GFR, EST NON AFRICAN AMERICAN: 83 mL/min (ref >=60)
GFR, Est African American: >89 mL/min (ref >=60)
Glucose, Bld: 97 mg/dL (ref 65–99)
Potassium: 3.9 mmol/L (ref 3.5–5.3)
SODIUM: 140 mmol/L (ref 135–146)

## 2015-09-04 LAB — HEPATIC FUNCTION PANEL
ALT: 22 U/L (ref 9–46)
AST: 18 U/L (ref 10–35)
Albumin: 4.1 g/dL (ref 3.6–5.1)
Alkaline Phosphatase: 52 U/L (ref 40–115)
BILIRUBIN DIRECT: 0.3 mg/dL — AB (ref <=0.2)
BILIRUBIN INDIRECT: 1.4 mg/dL — AB (ref 0.2–1.2)
Total Bilirubin: 1.7 mg/dL — ABNORMAL HIGH (ref 0.2–1.2)
Total Protein: 6.6 g/dL (ref 6.1–8.1)

## 2015-09-04 LAB — LIPID PANEL
CHOL/HDL RATIO: 2.9 ratio (ref <=5.0)
Cholesterol: 188 mg/dL (ref 125–200)
HDL: 65 mg/dL (ref >=40)
LDL Cholesterol: 113 mg/dL (ref <130)
Triglycerides: 51 mg/dL (ref <150)
VLDL: 10 mg/dL (ref <30)

## 2015-09-04 LAB — TSH: TSH: 1 m[IU]/L (ref 0.40–4.50)

## 2015-09-04 LAB — MAGNESIUM: Magnesium: 2.1 mg/dL (ref 1.5–2.5)

## 2015-09-04 LAB — HEMOGLOBIN A1C
HEMOGLOBIN A1C: 5.8 % — AB (ref <5.7)
Mean Plasma Glucose: 120 mg/dL — ABNORMAL HIGH (ref <117)

## 2015-09-04 NOTE — Patient Instructions (Signed)

## 2015-09-04 NOTE — Progress Notes (Signed)
Patient ID: Damon [name], male   DOB: 12-13-1945, 70 y.o.   MRN: [id number]    This very nice 71 y.o. MWM presents for 6 month follow up with Hypertension, ASHD s/p pAFib/Flutter, ASPVD s/p SFA stent,  OSA/CPAP,  Hyperlipidemia, Pre-Diabetes and Vitamin D Deficiency.    Patient is treated for HTN since 1996 & BP has been controlled at home. Today's BP: 132/84 mmHg.  Patient has hx/o pAfib predating to 2004 and had his 1st RFA in Jan 2014 and more recently a 2sd RFA in July 2016. Since then has done well with only an occasional transient brief palpitation. He is on Xarelto .  Patient has had no complaints of any cardiac type chest pain,  dyspnea/orthopnea/PND, dizziness, claudication, or dependent edema.   Hyperlipidemia is controlled with diet & meds. Patient denies myalgias or other med SE's. Last Lipids were at goal with Cholesterol 136; HDL 66; LDL 58; Triglycerides 58 on 06/12/2015.   Also, the patient has history of PreDiabetes since May 2011 with A1c 5.7%  and has had no symptoms of reactive hypoglycemia, diabetic polys, paresthesias or visual blurring.  Last A1c was 5.4% on 06/12/2015.   Further, the patient also has history of Vitamin D Deficiency of "27" in 2008 and supplements vitamin D without any suspected side-effects. Last vitamin D was  74 on 02/16/2015.   Medication Sig  . VITAMIN C 1000 MG  Take 1,000 mg by mouth daily as needed (vitamin supplement).   Marland Kitchen aspirin EC 81 MG  Take 1 tablet (81 mg total) by mouth daily.  . bisoprolol-hctz [facility]) 10-6.25  TAKE ONE TABLET BY MOUTH ONCE DAILY  . CRESTOR 40 MG TAKE ONE TABLET BY MOUTH ONCE DAILY  . MULITIVITAMIN WITH MINERALS Take 1 tablet by mouth daily. FOR MEN  . NITROSTAT 0.4 MG SL Place 1 tablet (0.4 mg total) under the tongue every 5 (five) minutes as needed for chest pain.  . rivaroxaban (XARELTO) 20 MG  Take 1 tablet (20 mg total) by mouth daily with supper.  Marland Kitchen VITAMIN D Take 5,000 Units by mouth daily. Take 1 tablet daily    Allergies  Allergen Reactions  . Zetia [Ezetimibe] myalgias   PMHx:   Past Medical History  Diagnosis Date  . Hyperlipidemia   . Mitral regurgitation   . Bladder cancer ([place])   . Hearing loss of both ears   . History of colon polyps     BENIGN  . PAD (peripheral artery disease) (HCC)     ABI--  RIGHT SIDE NORMAL LEFT SIDE MODERATELY REDUCED W/ DISTAL LEFT SFA STENOSIS//  MILD CALDICATION  . Hypertension   . PAF (paroxysmal atrial fibrillation) ([place])      a. s/p PVI 2014; b. s/p redo PVI and CTI 01/2015  . Pre-diabetes   . Vitamin D deficiency   . OSA on CPAP     cpap settign of 13   Immunization History  Administered Date(s) Administered  . DT 07/22/2003, 12/28/2013  . Influenza Whole 05/19/2013  . Influenza,inj,Quad PF,36+ Mos 05/09/2014  . Influenza-Unspecified 05/14/2015  . Pneumococcal Conjugate-13 07/11/2014  . Pneumococcal Polysaccharide-23 12/04/2009  . Tdap 09/22/2013   Past Surgical History  Procedure Laterality Date  . Total hip arthroplasty  07/25/2011    Procedure: TOTAL HIP ARTHROPLASTY ANTERIOR APPROACH;  Surgeon: Mcarthur Rossetti;  [facility];  Service: Orthopedics;  Laterality: Right;  . Tee without cardioversion  07/26/2012    Procedure: TRANSESOPHAGEAL ECHOCARDIOGRAM (TEE);  Surgeon: [name],  MD;  Location: [place];  Service: Cardiovascular;  Laterality: N/A;  . Cardioversion  08/06/2012    Procedure: CARDIOVERSION;  Surgeon: [name], MD;  Location: [place];  Service: Cardiovascular;  Laterality: N/A;  . Total hip arthroplasty Left 02-08-2010  . Cataract extraction w/ intraocular lens  implant, bilateral    . Cardiac electrophysiology study and ablation  07-27-2012  DR ALLRED    SUCCESSFUL ABLATION OF A-FIB  . Tonsillectomy  age 15  . Cystoscopy w/ retrogrades Bilateral 04/12/2013    Procedure: CYSTOSCOPY WITH RETROGRADE PYELOGRAM;  Surgeon: [name], MD;  Location: [facility];  Service: Urology;   Laterality: Bilateral;  . Transurethral resection of bladder tumor with gyrus (turbt-gyrus) N/A 04/12/2013    Procedure: TRANSURETHRAL RESECTION OF BLADDER TUMOR WITH GYRUS (TURBT-GYRUS);  Surgeon: [name], MD;  Location: [facility];  Service: Urology;  Laterality: N/A;  . Atrial fibrillation ablation N/A 07/27/2012    Procedure: ATRIAL FIBRILLATION ABLATION;  Surgeon: [name], MD;  Location: [facility] CATH LAB;  Service: Cardiovascular;  Laterality: N/A;  . Left heart catheterization with coronary angiogram N/A 11/24/2013    Procedure: LEFT HEART CATHETERIZATION WITH CORONARY ANGIOGRAM;  Surgeon: [name], MD;  Location: [facility] CATH LAB;  Service: Cardiovascular;  Laterality: N/A;  . Percutaneous coronary stent intervention (pci-s)  11/24/2013    Procedure: PERCUTANEOUS CORONARY STENT INTERVENTION (PCI-S);  Surgeon: [name], MD;  Location: [facility] CATH LAB;  Service: Cardiovascular;;  . Percutaneous coronary rotoblator intervention (pci-r) N/A 11/28/2013    Procedure: PERCUTANEOUS CORONARY ROTOBLATOR INTERVENTION (PCI-R);  Surgeon: [name], MD;  Location: [facility] CATH LAB;  Service: Cardiovascular;  Laterality: N/A;  . Tee without cardioversion N/A 08/25/2014    Procedure: TRANSESOPHAGEAL ECHOCARDIOGRAM (TEE);  Surgeon: Larey Dresser, MD;  Location: [place];  Service: Cardiovascular;  Laterality: N/A;  . Cardioversion N/A 08/25/2014    Procedure: CARDIOVERSION;  Surgeon: Larey Dresser, MD;  Location: [place];  Service: Cardiovascular;  Laterality: N/A;  . Cardioversion N/A 11/13/2014    Procedure: CARDIOVERSION;  Surgeon: Sueanne Margarita, MD;  Location: [facility] ENDOSCOPY;  Service: Cardiovascular;  Laterality: N/A;  . Cardioversion N/A 12/28/2014    Procedure: CARDIOVERSION;  Surgeon: [name], MD;  Location: [place];  Service: Cardiovascular;  Laterality: N/A;  . Electrophysiologic study N/A 01/26/2015    Procedure: Atrial Fibrillation  Ablation;  Surgeon: [name], MD;  Location: [place] CV LAB;  Service: Cardiovascular;  Laterality: N/A;  . Tee without cardioversion  01/26/2015    Procedure: Transesophageal Echocardiogram (Tee);  Surgeon: [name], MD;  Location: [place] CV LAB;  Service: Cardiovascular;;  . Ablation of dysrhythmic focus  01/26/2015  . Colonoscopy with propofol N/A 02/13/2015    Procedure: COLONOSCOPY WITH PROPOFOL;  Surgeon: Garlan Fair, MD;  [facility];  Service: Endoscopy;  Laterality: N/A;   FHx:    Reviewed / unchanged  SHx:    Reviewed / unchanged  Systems Review:  Constitutional: Denies fever, chills, wt changes, headaches, insomnia, fatigue, night sweats, change in appetite. Eyes: Denies redness, blurred vision, diplopia, discharge, itchy, watery eyes.  ENT: Denies discharge, congestion, post nasal drip, epistaxis, sore throat, earache, hearing loss, dental pain, tinnitus, vertigo, sinus pain, snoring.  CV: Denies chest pain, palpitations, irregular heartbeat, syncope, dyspnea, diaphoresis, orthopnea, PND, claudication or edema. Respiratory: denies cough, dyspnea, DOE, pleurisy, hoarseness, laryngitis, wheezing.  Gastrointestinal: Denies dysphagia, odynophagia, heartburn, reflux, water brash, abdominal pain or cramps, nausea,  vomiting, bloating, diarrhea, constipation, hematemesis, melena, hematochezia  or hemorrhoids. Genitourinary: Denies dysuria, frequency, urgency, nocturia, hesitancy, discharge, hematuria or flank pain. Musculoskeletal: Denies arthralgias, myalgias, stiffness, jt. swelling, pain, limping or strain/sprain.  Skin: Denies pruritus, rash, hives, warts, acne, eczema or change in skin lesion(s). Neuro: No weakness, tremor, incoordination, spasms, paresthesia or pain. Psychiatric: Denies confusion, memory loss or sensory loss. Endo: Denies change in weight, skin or hair change.  Heme/Lymph: No excessive bleeding, bruising or enlarged lymph  nodes.  Physical Exam  BP 132/84 mmHg  Pulse 72  Temp(Src) 97.6 F (36.4 C)  Resp 16  Ht 5' 9.5" (1.765 m)  Wt 207 lb 3.2 oz (93.985 kg)  BMI 30.17 kg/m2  Appears well nourished and in no distress. Eyes: PERRLA, EOMs, conjunctiva no swelling or erythema. Sinuses: No frontal/maxillary tenderness ENT/Mouth: EAC's clear, TM's nl w/o erythema, bulging. Nares clear w/o erythema, swelling, exudates. Oropharynx clear without erythema or exudates. Oral hygiene is good. Tongue normal, non obstructing. Hearing intact.  Neck: Supple. Thyroid nl. Car 2+/2+ without bruits, nodes or JVD. Chest: Respirations nl with BS clear & equal w/o rales, rhonchi, wheezing or stridor.  Cor: Heart sounds normal w/ regular rate and rhythm without sig. murmurs, gallops, clicks, or rubs. Peripheral pulses normal and equal  without edema.  Abdomen: Soft & bowel sounds normal. Non-tender w/o guarding, rebound, hernias, masses, or organomegaly.  Lymphatics: Unremarkable.  Musculoskeletal: Full ROM all peripheral extremities, joint stability, 5/5 strength, and normal gait.  Skin: Warm, dry without exposed rashes, lesions or ecchymosis apparent.  Neuro: Cranial nerves intact, reflexes equal bilaterally. Sensory-motor testing grossly intact. Tendon reflexes grossly intact.  Pysch: Alert & oriented x 3.  Insight and judgement nl & appropriate. No ideations.  Assessment and Plan:  1. Essential hypertension  - TSH  2. Hyperlipidemia  - Lipid panel - TSH  3. Prediabetes  - Hemoglobin A1c - Insulin, random  4. Vitamin D deficiency  - VITAMIN D 25 Hydroxy   5. Medication management  - CBC with Differential/Platelet - BASIC METABOLIC PANEL WITH GFR - Hepatic function panel - Magnesium  6. Paroxysmal atrial fibrillation (HCC)   Recommended regular exercise, BP monitoring, weight control, and discussed med and SE's. Recommended labs to assess and monitor clinical status. Further disposition pending results  of labs. Over 30 minutes of exam, counseling, chart review was performed

## 2015-09-05 LAB — INSULIN, RANDOM: Insulin: 6.7 u[IU]/mL (ref 2.0–19.6)

## 2015-09-05 LAB — VITAMIN D 25 HYDROXY (VIT D DEFICIENCY, FRACTURES): Vit D, 25-Hydroxy: 74 ng/mL (ref 30–100)

## 2015-09-10 ENCOUNTER — Other Ambulatory Visit: Payer: Self-pay | Admitting: Nurse Practitioner

## 2015-10-10 ENCOUNTER — Other Ambulatory Visit: Payer: Self-pay | Admitting: Cardiology

## 2015-12-04 ENCOUNTER — Ambulatory Visit (INDEPENDENT_AMBULATORY_CARE_PROVIDER_SITE_OTHER): Payer: Medicare Other | Admitting: Physician Assistant

## 2015-12-04 ENCOUNTER — Encounter: Payer: Self-pay | Admitting: Physician Assistant

## 2015-12-04 VITALS — BP 130/80 | HR 64 | Temp 97.5°F | Resp 16 | Ht 69.5 in | Wt 201.0 lb

## 2015-12-04 DIAGNOSIS — E559 Vitamin D deficiency, unspecified: Secondary | ICD-10-CM | POA: Diagnosis not present

## 2015-12-04 DIAGNOSIS — C679 Malignant neoplasm of bladder, unspecified: Secondary | ICD-10-CM | POA: Diagnosis not present

## 2015-12-04 DIAGNOSIS — I340 Nonrheumatic mitral (valve) insufficiency: Secondary | ICD-10-CM

## 2015-12-04 DIAGNOSIS — I25118 Atherosclerotic heart disease of native coronary artery with other forms of angina pectoris: Secondary | ICD-10-CM

## 2015-12-04 DIAGNOSIS — G4730 Sleep apnea, unspecified: Secondary | ICD-10-CM

## 2015-12-04 DIAGNOSIS — I481 Persistent atrial fibrillation: Secondary | ICD-10-CM

## 2015-12-04 DIAGNOSIS — Z0001 Encounter for general adult medical examination with abnormal findings: Secondary | ICD-10-CM

## 2015-12-04 DIAGNOSIS — R6889 Other general symptoms and signs: Secondary | ICD-10-CM | POA: Diagnosis not present

## 2015-12-04 DIAGNOSIS — M1610 Unilateral primary osteoarthritis, unspecified hip: Secondary | ICD-10-CM

## 2015-12-04 DIAGNOSIS — I739 Peripheral vascular disease, unspecified: Secondary | ICD-10-CM

## 2015-12-04 DIAGNOSIS — Z0000 Encounter for general adult medical examination without abnormal findings: Secondary | ICD-10-CM

## 2015-12-04 DIAGNOSIS — I10 Essential (primary) hypertension: Secondary | ICD-10-CM | POA: Diagnosis not present

## 2015-12-04 DIAGNOSIS — I4819 Other persistent atrial fibrillation: Secondary | ICD-10-CM

## 2015-12-04 DIAGNOSIS — Z79899 Other long term (current) drug therapy: Secondary | ICD-10-CM

## 2015-12-04 DIAGNOSIS — R7303 Prediabetes: Secondary | ICD-10-CM | POA: Diagnosis not present

## 2015-12-04 DIAGNOSIS — K219 Gastro-esophageal reflux disease without esophagitis: Secondary | ICD-10-CM

## 2015-12-04 DIAGNOSIS — E782 Mixed hyperlipidemia: Secondary | ICD-10-CM | POA: Diagnosis not present

## 2015-12-04 LAB — BASIC METABOLIC PANEL WITH GFR
BUN: 15 mg/dL (ref 7–25)
CHLORIDE: 106 mmol/L (ref 98–110)
CO2: 27 mmol/L (ref 20–31)
CREATININE: 0.89 mg/dL (ref 0.70–1.18)
Calcium: 9.8 mg/dL (ref 8.6–10.3)
GFR, Est African American: >89 mL/min (ref >=60)
GFR, Est Non African American: 87 mL/min (ref >=60)
GLUCOSE: 92 mg/dL (ref 65–99)
POTASSIUM: 4 mmol/L (ref 3.5–5.3)
Sodium: 142 mmol/L (ref 135–146)

## 2015-12-04 LAB — CBC WITH DIFFERENTIAL/PLATELET
BASOS ABS: 0 {cells}/uL (ref 0–200)
BASOS PCT: 0 %
Eosinophils Absolute: 174 cells/uL (ref 15–500)
Eosinophils Relative: 3 %
HEMATOCRIT: 45.1 % (ref 38.5–50.0)
Hemoglobin: 15.2 g/dL (ref 13.2–17.1)
LYMPHS PCT: 28 %
Lymphs Abs: 1624 cells/uL (ref 850–3900)
MCH: 29.9 pg (ref 27.0–33.0)
MCHC: 33.7 g/dL (ref 32.0–36.0)
MCV: 88.8 fL (ref 80.0–100.0)
MONO ABS: 464 {cells}/uL (ref 200–950)
MPV: 9.4 fL (ref 7.5–12.5)
Monocytes Relative: 8 %
Neutro Abs: 3538 cells/uL (ref 1500–7800)
Neutrophils Relative %: 61 %
Platelets: 179 10*3/uL (ref 140–400)
RBC: 5.08 MIL/uL (ref 4.20–5.80)
RDW: 13 % (ref 11.0–15.0)
WBC: 5.8 10*3/uL (ref 3.8–10.8)

## 2015-12-04 LAB — LIPID PANEL
CHOL/HDL RATIO: 2.3 ratio (ref <=5.0)
Cholesterol: 150 mg/dL (ref 125–200)
HDL: 64 mg/dL (ref >=40)
LDL Cholesterol: 74 mg/dL (ref <130)
Triglycerides: 62 mg/dL (ref <150)
VLDL: 12 mg/dL (ref <30)

## 2015-12-04 LAB — HEPATIC FUNCTION PANEL
ALBUMIN: 4.3 g/dL (ref 3.6–5.1)
ALK PHOS: 51 U/L (ref 40–115)
ALT: 21 U/L (ref 9–46)
AST: 18 U/L (ref 10–35)
Bilirubin, Direct: 0.3 mg/dL — ABNORMAL HIGH (ref <=0.2)
Indirect Bilirubin: 1.4 mg/dL — ABNORMAL HIGH (ref 0.2–1.2)
TOTAL PROTEIN: 6.4 g/dL (ref 6.1–8.1)
Total Bilirubin: 1.7 mg/dL — ABNORMAL HIGH (ref 0.2–1.2)

## 2015-12-04 LAB — HEMOGLOBIN A1C
Hgb A1c MFr Bld: 5.4 % (ref <5.7)
Mean Plasma Glucose: 108 mg/dL

## 2015-12-04 LAB — MAGNESIUM: Magnesium: 2 mg/dL (ref 1.5–2.5)

## 2015-12-04 NOTE — Progress Notes (Signed)
Patient ID: Damon [name], male   DOB: 05/01/1946, 70 y.o.   MRN: [id number]  MEDICARE ANNUAL WELLNESS VISIT AND OV  Assessment:   1. Essential hypertension - CBC with Differential/Platelet - BASIC METABOLIC PANEL WITH GFR - Hepatic function panel - TSH  2. Persistent atrial fibrillation ([name]) Continue follow up cardio  3. Hyperlipidemia - Lipid panel  4. Prediabetes - Hemoglobin A1c  5. Vitamin D deficiency - VITAMIN D 25 Hydroxy (Vit-D Deficiency, Fractures)  6. Atherosclerosis of native coronary artery of native heart with other form of angina pectoris ([name]) Control blood pressure, cholesterol, glucose, increase exercise.   7. Malignant neoplasm of urinary bladder, unspecified site [name]) Continue close monitoring  8. Sleep apnea  9. Medication management - Magnesium  10. Mitral regurgitation  11. Peripheral arterial disease (HCC) Control blood pressure, cholesterol, glucose, increase exercise.   12. Primary osteoarthritis of hip, unspecified laterality  13. Gastroesophageal reflux disease, esophagitis presence not specified  14. Encounter for Medicare annual wellness exam   Plan:   During the course of the visit the patient was educated and counseled about appropriate screening and preventive services including:    Pneumococcal vaccine   Influenza vaccine  Td vaccine  Screening electrocardiogram  Bone densitometry screening  Colorectal cancer screening  Diabetes screening  Glaucoma screening  Nutrition counseling   Advanced directives: requested  Conditions/risks identified: BMI: Discussed weight loss, diet, and increase physical activity.  Increase physical activity: AHA recommends 150 minutes of physical activity a week.  Medications reviewed PreDiabetes is at goal, ACE/ARB therapy: Not Indicated Urinary Incontinence is not an issue: discussed non pharmacology and pharmacology options.  Fall risk: low- discussed PT, home fall  assessment, medications.   Subjective:   Damon [name]  presents for Medicare Annual Wellness Visit and 3 month follow up for Hypertension, Hyperlipidemia, Pre-Diabetes and Vitamin D Deficiency.   In Sept 2014 , Patient had TUR of a Bladder cancer by Dr Carrie Mew, follows regularly.   Patient has HTN. Today's BP: 130/80 mmHg.  Patient has hx/o pAfib/flutter predating back to 2004 and has had several CV.   In May 2015 he had PDCA/DES following Rotablator atherotomy at the prox circ. Patient has had no complaints of any cardiac type chest pain, palpitations, dyspnea/orthopnea/PND, dizziness, claudication, or dependent edema.  Hyperlipidemia is controlled with diet & meds. Patient denies myalgias or other med SE's. Last Lipids were at goal -   Lab Results  Component Value Date   CHOL 188 09/04/2015   HDL 65 09/04/2015   LDLCALC 113 09/04/2015   TRIG 51 09/04/2015   CHOLHDL 2.9 09/04/2015   Also, the patient has history of PreDiabetes with A1c 5.7% in 2011 and 5.9% in 2013, and then 5.6% x 2 in late 2013.  He's had no symptoms of reactive hypoglycemia, diabetic polys, paresthesias or visual blurring.   Lab Results  Component Value Date   HGBA1C 5.8* 09/04/2015   Further, the patient also has history of Vitamin D Deficiency of 27 in 2008 and supplements vitamin D without any suspected side-effects. Lab Results  Component Value Date   VD25OH 74 09/04/2015    Names of Other Physician/Practitioners you currently use: 1. [facility] here for primary care 2. Dr Sherley Bounds, eye doctor, last visit  Oct/Nov 2016 3. ? Name of his dentist, dentist, last visit 2014, several years  Patient Care Team: [name], MD as PCP - General (Internal Medicine) Dwan Bolt, MD as Referring Physician (  Cardiology) [name], MD as Consulting Physician (Cardiology) [name], MD as Consulting Physician (Urology) Mcarthur Rossetti, MD as  Consulting Physician (Orthopedic Surgery) Ponciano Ort, MD as Referring Physician (Urology) Garlan Fair, MD as Consulting Physician (Gastroenterology) [name], MD as Consulting Physician (Cardiology)  Medication Review:   Medication List       This list is accurate as of: 12/04/15  9:36 AM.  Always use your most recent med list.               aspirin EC 81 MG tablet  Take 1 tablet (81 mg total) by mouth daily.     bisoprolol-hydrochlorothiazide 10-6.25 MG tablet  Commonly known as:  ZIAC  TAKE ONE TABLET BY MOUTH ONCE DAILY     CRESTOR 40 MG tablet  Generic drug:  rosuvastatin  TAKE ONE TABLET BY MOUTH ONCE DAILY     multivitamin with minerals Tabs tablet  Take 1 tablet by mouth daily. FOR MEN     NITROSTAT 0.4 MG SL tablet  Generic drug:  nitroGLYCERIN  DISSOLVE ONE TABLET UNDER THE TONGUE EVERY 5 MINUTES AS NEEDED FOR CHEST PAIN.  DO NOT EXCEED A TOTAL OF 3 DOSES IN 15 MINUTES     vitamin C 1000 MG tablet  Take 1,000 mg by mouth daily as needed (vitamin supplement).     VITAMIN D (ERGOCALCIFEROL) PO  Take 5,000 Units by mouth daily. Take 1 tablet daily     XARELTO 20 MG Tabs tablet  Generic drug:  rivaroxaban  TAKE ONE TABLET BY MOUTH ONCE DAILY WITH SUPPER        Current Problems (verified) Patient Active Problem List   Diagnosis Date Noted  . Bladder cancer ([place]) 02/16/2015  . GERD  02/16/2015  . A-fib ([place]) 01/26/2015  . Medication management 12/28/2013  . CAD- CFX PTCA 11/24/13 with plans for DES 11/28/13 11/25/2013  . Hyperlipidemia 06/22/2013  . Prediabetes 06/22/2013  . Vitamin D deficiency 06/22/2013  . Peripheral arterial disease ([place]) 03/22/2013  . Hypertension   . Mitral regurgitation   . Degenerative arthritis of hip 07/25/2011  . Atrial fibrillation ([place]) 07/11/2011  . Sleep apnea 07/11/2011    Screening Tests Immunization History  Administered Date(s) Administered  . DT 07/22/2003, 12/28/2013  . Influenza  Whole 05/19/2013  . Influenza,inj,Quad PF,36+ Mos 05/09/2014  . Influenza-Unspecified 05/14/2015  . Pneumococcal Conjugate-13 07/11/2014  . Pneumococcal Polysaccharide-23 12/04/2009  . Tdap 09/22/2013   TDAP 2015 Influenza 2016 Prevnar 13 2015 Pneumonia 2011  Preventative care: Last colonoscopy: 02/13/2015 as above   Past Surgical History  Procedure Laterality Date  . Total hip arthroplasty  07/25/2011    Procedure: TOTAL HIP ARTHROPLASTY ANTERIOR APPROACH;  Surgeon: Mcarthur Rossetti;  [facility];  Service: Orthopedics;  Laterality: Right;  . Tee without cardioversion  07/26/2012    Procedure: TRANSESOPHAGEAL ECHOCARDIOGRAM (TEE);  Surgeon: [name], MD;  Location: [facility] ENDOSCOPY;  Service: Cardiovascular;  Laterality: N/A;  . Cardioversion  08/06/2012    Procedure: CARDIOVERSION;  Surgeon: [name], MD;  Location: [place];  Service: Cardiovascular;  Laterality: N/A;  . Total hip arthroplasty Left 02-08-2010  . Cataract extraction w/ intraocular lens  implant, bilateral    . Cardiac electrophysiology study and ablation  07-27-2012  DR ALLRED    SUCCESSFUL ABLATION OF A-FIB  . Tonsillectomy  age 85  . Cystoscopy w/ retrogrades Bilateral 04/12/2013    Procedure: CYSTOSCOPY WITH RETROGRADE PYELOGRAM;  Surgeon: [name], MD;  Location: [place];  Service: Urology;  Laterality: Bilateral;  . Transurethral resection of bladder tumor with gyrus (turbt-gyrus) N/A 04/12/2013    Procedure: TRANSURETHRAL RESECTION OF BLADDER TUMOR WITH GYRUS (TURBT-GYRUS);  Surgeon: [name], MD;  [facility];  Service: Urology;  Laterality: N/A;  . Atrial fibrillation ablation N/A 07/27/2012    Procedure: ATRIAL FIBRILLATION ABLATION;  Surgeon: [name], MD;  Location: [facility] CATH LAB;  Service: Cardiovascular;  Laterality: N/A;  . Left heart catheterization with coronary angiogram N/A 11/24/2013    Procedure: LEFT HEART CATHETERIZATION WITH  CORONARY ANGIOGRAM;  Surgeon: [name], MD;  Location: [facility] CATH LAB;  Service: Cardiovascular;  Laterality: N/A;  . Percutaneous coronary stent intervention (pci-s)  11/24/2013    Procedure: PERCUTANEOUS CORONARY STENT INTERVENTION (PCI-S);  Surgeon: [name], MD;  Location: [facility] CATH LAB;  Service: Cardiovascular;;  . Percutaneous coronary rotoblator intervention (pci-r) N/A 11/28/2013    Procedure: PERCUTANEOUS CORONARY ROTOBLATOR INTERVENTION (PCI-R);  Surgeon: [name], MD;  Location: [facility] CATH LAB;  Service: Cardiovascular;  Laterality: N/A;  . Tee without cardioversion N/A 08/25/2014    Procedure: TRANSESOPHAGEAL ECHOCARDIOGRAM (TEE);  Surgeon: Larey Dresser, MD;  Location: [place];  Service: Cardiovascular;  Laterality: N/A;  . Cardioversion N/A 08/25/2014    Procedure: CARDIOVERSION;  Surgeon: Larey Dresser, MD;  Location: [place];  Service: Cardiovascular;  Laterality: N/A;  . Cardioversion N/A 11/13/2014    Procedure: CARDIOVERSION;  Surgeon: Sueanne Margarita, MD;  Location: [facility] ENDOSCOPY;  Service: Cardiovascular;  Laterality: N/A;  . Cardioversion N/A 12/28/2014    Procedure: CARDIOVERSION;  Surgeon: [name], MD;  Location: [place];  Service: Cardiovascular;  Laterality: N/A;  . Electrophysiologic study N/A 01/26/2015    Procedure: Atrial Fibrillation Ablation;  Surgeon: [name], MD;  Location: [place] CV LAB;  Service: Cardiovascular;  Laterality: N/A;  . Tee without cardioversion  01/26/2015    Procedure: Transesophageal Echocardiogram (Tee);  Surgeon: [name], MD;  Location: [place] CV LAB;  Service: Cardiovascular;;  . Ablation of dysrhythmic focus  01/26/2015  . Colonoscopy with propofol N/A 02/13/2015    Procedure: COLONOSCOPY WITH PROPOFOL;  Surgeon: Garlan Fair, MD;  [facility]OS[facility];  Service: Endoscopy;  Laterality: N/A;    Risk Factors: Tobacco Social History  Substance Use Topics  . Smoking  status: Former Smoker -- 2.00 packs/day for 10 years    Types: Cigarettes    Quit date: 09/20/1981  . Smokeless tobacco: Never Used  . Alcohol Use: No     Comment: pt states he has stopped drinking alcohol   He does not smoke.  Patient is a former smoker. Are there smokers in your home (other than you)?  No  Alcohol Current alcohol use: none  Caffeine Current caffeine use: denies use  Exercise Current exercise: bicycling, walking and occas golf  Nutrition/Diet Current diet: in general, a "healthy" diet    Cardiac risk factors: advanced age (older than 36 for men, 40 for women), dyslipidemia, hypertension, male gender, obesity (BMI >= 30 kg/m2), sedentary lifestyle and smoking/ tobacco exposure.  Depression Screen (Note: if answer to either of the following is "Yes", a more complete depression screening is indicated)   Q1: Over the past two weeks, have you felt down, depressed or hopeless? No  Q2: Over the past two weeks, have you felt little interest or pleasure in doing things? No  Have you lost interest or pleasure in daily life?  No  Do you often feel hopeless? No  Do you cry easily over simple problems? No  Activities of Daily Living In your present state of health, do you have any difficulty performing the following activities?:  Driving? No Managing money?  No Feeding yourself? No Getting from bed to chair? No Climbing a flight of stairs? No Preparing food and eating?: No Bathing or showering? No Getting dressed: No Getting to the toilet? No Using the toilet:No Moving around from place to place: No In the past year have you fallen or had a near fall?:No  Vision Difficulties: No  Hearing Difficulties: No Do you often ask people to speak up or repeat themselves? No Do you experience ringing or noises in your ears? No Do you have difficulty understanding soft or whispered voices? No  Cognition  Do you feel that you have a problem with memory?No  Do you often  misplace items? No  Do you feel safe at home?  Yes  Advanced directives Does patient have a [place]? No - offered forms   Does patient have a Living Will? No - offered forms    Objective:     Blood pressure 130/80, pulse 64, temperature 97.5 F (36.4 C), temperature source Temporal, resp. rate 16, height 5' 9.5" (1.765 m), weight 201 lb (91.173 kg), SpO2 95 %.   General Appearance:  Alert  WD/WN, male  in no apparent distress. Eyes: PERRLA, EOMs nl, conjunctiva normal, normal fundi and vessels. Sinuses: No frontal/maxillary tenderness ENT/Mouth: EACs patent / TMs  nl. Nares clear without erythema, swelling, mucoid exudates. Oral hygiene is good. No erythema, swelling, or exudate. Tongue normal, non-obstructing. Tonsils not swollen or erythematous. Hearing normal.  Neck: Supple, thyroid normal. No bruits, nodes or JVD. Respiratory: Respiratory effort normal.  BS equal and clear bilateral without rales, rhonci, wheezing or stridor. Cardio: Heart sounds are normal with regular rate and rhythm and no murmurs, rubs or gallops. Peripheral pulses are normal and equal bilaterally without edema. No aortic or femoral bruits. Chest: symmetric with normal excursions and percussion.  Abdomen: Flat, soft, with nl bowel sounds. Nontender, no guarding, rebound, hernias, masses, or organomegaly.  Lymphatics: Non tender without lymphadenopathy.  Genitourinary: Deferred for recent Colonoscopy and seeds Dr Carrie Mew annually for f/u of Bladder Cancer Musculoskeletal: Full ROM all peripheral extremities, joint stability, 5/5 strength, and normal gait. Skin: Warm and dry without rashes, lesions, cyanosis, clubbing or  ecchymosis.  Neuro: Cranial nerves intact, reflexes equal bilaterally. Normal muscle tone, no cerebellar symptoms. Sensation intact.  Pysch: Alert and oriented X 3 with normal affect, insight and judgment appropriate.   Cognitive Testing  Alert? Yes  Normal Appearance?  Yes  Oriented to person? Yes  Place? Yes   Time? Yes  Recall of three objects?  Yes  Can perform simple calculations? Yes  Displays appropriate judgment? Yes  Can read the correct time from a watch/clock? Yes  Medicare Attestation I have personally reviewed: The patient's medical and social history Their use of alcohol, tobacco or illicit drugs Their current medications and supplements The patient's functional ability including ADLs,fall risks, home safety risks, cognitive, and hearing and visual impairment Diet and physical activities Evidence for depression or mood disorders  The patient's weight, height, BMI, and visual acuity have been recorded in the chart.  I have made referrals, counseling, and provided education to the patient based on review of the above and I have provided the patient with a written personalized care plan for  preventive services.  Over 40 minutes of exam, counseling, chart review was performed.  Vicie Mutters, PA-C   12/04/2015

## 2015-12-04 NOTE — Patient Instructions (Signed)
[employer] with no obligation # [phone] Do not have to be a member Tues-Sat 10-6  [place]- free test with no obligation # [phone] MUST BE A MEMBER Call for store hours  Have had patient's get good cheaper hearing aids from [employer] The air version has good reviews.

## 2015-12-05 LAB — VITAMIN D 25 HYDROXY (VIT D DEFICIENCY, FRACTURES): Vit D, 25-Hydroxy: 84 ng/mL (ref 30–100)

## 2015-12-05 LAB — TSH: TSH: 0.78 mIU/L (ref 0.40–4.50)

## 2016-01-04 ENCOUNTER — Encounter: Payer: Self-pay | Admitting: Internal Medicine

## 2016-01-04 ENCOUNTER — Ambulatory Visit (INDEPENDENT_AMBULATORY_CARE_PROVIDER_SITE_OTHER): Payer: Medicare Other | Admitting: Internal Medicine

## 2016-01-04 VITALS — BP 119/70 | HR 66 | Ht 69.5 in | Wt 202.2 lb

## 2016-01-04 DIAGNOSIS — I4819 Other persistent atrial fibrillation: Secondary | ICD-10-CM

## 2016-01-04 DIAGNOSIS — I10 Essential (primary) hypertension: Secondary | ICD-10-CM

## 2016-01-04 DIAGNOSIS — I481 Persistent atrial fibrillation: Secondary | ICD-10-CM

## 2016-01-04 NOTE — Patient Instructions (Addendum)
Medication Instructions:  Your physician has recommended you make the following change in your medication:  1) STOP Aspirin  Labwork: None  ordered  Testing/Procedures: None ordered  Follow-Up: Your physician recommends that you schedule a follow-up appointment in: 3 months with Dr. Angelena Form  Your physician wants you to follow-up in: 6 months with Dr. [name]. You will receive a reminder letter in the mail two months in advance. If you don't receive a letter, please call our office to schedule the follow-up appointment.    Any Other Special Instructions Will Be Listed Below (If Applicable).     If you need a refill on your cardiac medications before your next appointment, please call your pharmacy.

## 2016-01-04 NOTE — Progress Notes (Signed)
Electrophysiology Office Note   Date:  01/04/2016   ID:  Damon [name], DOB Jul 03, 1946, MRN [id number]  PCP:  [name], MD  Cardiologist:  Dr Angelena Form Primary Electrophysiologist: [name], MD    Chief Complaint  Patient presents with  . Atrial Fibrillation     History of Present Illness: Damon [name] is a 70 y.o. male who presents today for electrophysiology evaluation.   Doing well at this time without afib off of AADs post ablation.   Today, he denies symptoms of palpitations, chest pain, shortness of breath, orthopnea, PND, lower extremity edema, claudication, dizziness, presyncope, syncope, bleeding, or neurologic sequela. The patient is tolerating medications without difficulties and is otherwise without complaint today.    Past Medical History  Diagnosis Date  . Hyperlipidemia   . Mitral regurgitation   . Bladder cancer ([place])   . Hearing loss of both ears   . History of colon polyps     BENIGN  . PAD (peripheral artery disease) (HCC)     ABI--  RIGHT SIDE NORMAL LEFT SIDE MODERATELY REDUCED W/ DISTAL LEFT SFA STENOSIS//  MILD CALDICATION  . Hypertension   . PAF (paroxysmal atrial fibrillation) ([place])      a. s/p PVI 2014; b. s/p redo PVI and CTI 01/2015  . Pre-diabetes   . Vitamin D deficiency   . OSA on CPAP     cpap settign of 13   Past Surgical History  Procedure Laterality Date  . Total hip arthroplasty  07/25/2011    Procedure: TOTAL HIP ARTHROPLASTY ANTERIOR APPROACH;  Surgeon: Mcarthur Rossetti;  [facility];  Service: Orthopedics;  Laterality: Right;  . Tee without cardioversion  07/26/2012    Procedure: TRANSESOPHAGEAL ECHOCARDIOGRAM (TEE);  Surgeon: [name], MD;  Location: [facility] ENDOSCOPY;  Service: Cardiovascular;  Laterality: N/A;  . Cardioversion  08/06/2012    Procedure: CARDIOVERSION;  Surgeon: [name], MD;  Location: [place];  Service: Cardiovascular;  Laterality: N/A;  . Total hip arthroplasty Left  02-08-2010  . Cataract extraction w/ intraocular lens  implant, bilateral    . Cardiac electrophysiology study and ablation  07-27-2012  DR Moussa Wiegand    SUCCESSFUL ABLATION OF A-FIB  . Tonsillectomy  age 34  . Cystoscopy w/ retrogrades Bilateral 04/12/2013    Procedure: CYSTOSCOPY WITH RETROGRADE PYELOGRAM;  Surgeon: [name], MD;  Location: [facility];  Service: Urology;  Laterality: Bilateral;  . Transurethral resection of bladder tumor with gyrus (turbt-gyrus) N/A 04/12/2013    Procedure: TRANSURETHRAL RESECTION OF BLADDER TUMOR WITH GYRUS (TURBT-GYRUS);  Surgeon: [name], MD;  Location: [facility];  Service: Urology;  Laterality: N/A;  . Atrial fibrillation ablation N/A 07/27/2012    Procedure: ATRIAL FIBRILLATION ABLATION;  Surgeon: [name], MD;  Location: [facility] CATH LAB;  Service: Cardiovascular;  Laterality: N/A;  . Left heart catheterization with coronary angiogram N/A 11/24/2013    Procedure: LEFT HEART CATHETERIZATION WITH CORONARY ANGIOGRAM;  Surgeon: [name], MD;  Location: [facility] CATH LAB;  Service: Cardiovascular;  Laterality: N/A;  . Percutaneous coronary stent intervention (pci-s)  11/24/2013    Procedure: PERCUTANEOUS CORONARY STENT INTERVENTION (PCI-S);  Surgeon: [name], MD;  Location: [facility] CATH LAB;  Service: Cardiovascular;;  . Percutaneous coronary rotoblator intervention (pci-r) N/A 11/28/2013    Procedure: PERCUTANEOUS CORONARY ROTOBLATOR INTERVENTION (PCI-R);  Surgeon: [name], MD;  Location: [facility] CATH LAB;  Service: Cardiovascular;  Laterality: N/A;  . Tee without cardioversion N/A 08/25/2014  Procedure: TRANSESOPHAGEAL ECHOCARDIOGRAM (TEE);  Surgeon: Larey Dresser, MD;  Location: [place];  Service: Cardiovascular;  Laterality: N/A;  . Cardioversion N/A 08/25/2014    Procedure: CARDIOVERSION;  Surgeon: Larey Dresser, MD;  Location: [place];  Service: Cardiovascular;  Laterality: N/A;  .  Cardioversion N/A 11/13/2014    Procedure: CARDIOVERSION;  Surgeon: Sueanne Margarita, MD;  Location: [facility] ENDOSCOPY;  Service: Cardiovascular;  Laterality: N/A;  . Cardioversion N/A 12/28/2014    Procedure: CARDIOVERSION;  Surgeon: [name], MD;  Location: [place];  Service: Cardiovascular;  Laterality: N/A;  . Electrophysiologic study N/A 01/26/2015    Procedure: Atrial Fibrillation Ablation;  Surgeon: [name], MD;  Location: [place] CV LAB;  Service: Cardiovascular;  Laterality: N/A;  . Tee without cardioversion  01/26/2015    Procedure: Transesophageal Echocardiogram (Tee);  Surgeon: [name], MD;  Location: [place] CV LAB;  Service: Cardiovascular;;  . Ablation of dysrhythmic focus  01/26/2015  . Colonoscopy with propofol N/A 02/13/2015    Procedure: COLONOSCOPY WITH PROPOFOL;  Surgeon: Garlan Fair, MD;  [facility]OS[facility];  Service: Endoscopy;  Laterality: N/A;     Current Outpatient Prescriptions  Medication Sig Dispense Refill  . Ascorbic Acid (VITAMIN C) 1000 MG tablet Take 1,000 mg by mouth daily as needed (vitamin supplement).     Marland Kitchen aspirin EC 81 MG tablet Take 1 tablet (81 mg total) by mouth daily. 90 tablet 3  . bisoprolol-hydrochlorothiazide (ZIAC) 10-6.25 MG per tablet TAKE ONE TABLET BY MOUTH ONCE DAILY 90 tablet 1  . CRESTOR 40 MG tablet TAKE ONE TABLET BY MOUTH ONCE DAILY 90 tablet 1  . Multiple Vitamin (MULITIVITAMIN WITH MINERALS) TABS Take 1 tablet by mouth daily. FOR MEN    . NITROSTAT 0.4 MG SL tablet DISSOLVE ONE TABLET UNDER THE TONGUE EVERY 5 MINUTES AS NEEDED FOR CHEST PAIN.  DO NOT EXCEED A TOTAL OF 3 DOSES IN 15 MINUTES 25 tablet 6  . VITAMIN D, ERGOCALCIFEROL, PO Take 5,000 Units by mouth daily. Take 1 tablet daily    . XARELTO 20 MG TABS tablet TAKE ONE TABLET BY MOUTH ONCE DAILY WITH SUPPER 30 tablet 6   No current facility-administered medications for this visit.    Allergies:   Zetia   Social History:  The patient  reports that  he quit smoking about 34 years ago. His smoking use included Cigarettes. He has a 20 pack-year smoking history. He has never used smokeless tobacco. He reports that he does not drink alcohol or use illicit drugs.   Family History:  The patient's  family history includes Heart attack in his mother; Heart disease in his father and mother; Heart failure in his father.    ROS:  Please see the history of present illness.   All other systems are reviewed and negative.    PHYSICAL EXAM: VS:  BP 119/70 mmHg  Pulse 66  Ht 5' 9.5" (1.765 m)  Wt 202 lb 3.2 oz (91.717 kg)  BMI 29.44 kg/m2 , BMI Body mass index is 29.44 kg/(m^2). GEN: Well nourished, well developed, in no acute distress HEENT: normal Neck: no JVD, carotid bruits, or masses Cardiac: RRR; no murmurs, rubs, or gallops,no edema  Respiratory:  clear to auscultation bilaterally, normal work of breathing GI: soft, nontender, nondistended, + BS MS: no deformity or atrophy Skin: warm and dry  Neuro:  Strength and sensation are intact Psych: euthymic mood, full affect  EKG:  EKG is ordered today. The ekg  ordered today shows sinus rhythm   Recent Labs: 12/04/2015: ALT 21; BUN 15; Creat 0.89; Hemoglobin 15.2; Magnesium 2.0; Platelets 179; Potassium 4.0; Sodium 142; TSH 0.78    Lipid Panel     Component Value Date/Time   CHOL 150 12/04/2015 0956   TRIG 62 12/04/2015 0956   HDL 64 12/04/2015 0956   CHOLHDL 2.3 12/04/2015 0956   VLDL 12 12/04/2015 0956   LDLCALC 74 12/04/2015 0956     Wt Readings from Last 3 Encounters:  01/04/16 202 lb 3.2 oz (91.717 kg)  12/04/15 201 lb (91.173 kg)  09/04/15 207 lb 3.2 oz (93.985 kg)      ASSESSMENT AND PLAN:  1.  Persistent atrial fibrillation Doing well off of AAD post ablation.    Continue anticoagulation for chads2vasc score of 3. Stop ASA  2. CAD Doing well Stop ASA  3. OSA Compliance is encouraged  4. HTN Stable No change required today   Current medicines are  reviewed at length with the patient today.   The patient does not have concerns regarding his medicines.  The following changes were made today:  none  Follow-up with Dr Angelena Form in 3 months and I will see  In 6 months  Signed, [name], MD  01/04/2016 12:44 PM     [facility] [address] [place]   [id number] [phone] (office) [phone] (fax)

## 2016-02-03 ENCOUNTER — Other Ambulatory Visit: Payer: Self-pay | Admitting: Internal Medicine

## 2016-03-06 ENCOUNTER — Ambulatory Visit (INDEPENDENT_AMBULATORY_CARE_PROVIDER_SITE_OTHER): Payer: Medicare Other | Admitting: Internal Medicine

## 2016-03-06 ENCOUNTER — Encounter: Payer: Self-pay | Admitting: Internal Medicine

## 2016-03-06 VITALS — BP 130/86 | HR 65 | Temp 97.9°F | Resp 16 | Ht 69.5 in | Wt 203.2 lb

## 2016-03-06 DIAGNOSIS — G4730 Sleep apnea, unspecified: Secondary | ICD-10-CM

## 2016-03-06 DIAGNOSIS — I10 Essential (primary) hypertension: Secondary | ICD-10-CM

## 2016-03-06 DIAGNOSIS — Z0000 Encounter for general adult medical examination without abnormal findings: Secondary | ICD-10-CM

## 2016-03-06 DIAGNOSIS — Z0001 Encounter for general adult medical examination with abnormal findings: Secondary | ICD-10-CM

## 2016-03-06 DIAGNOSIS — Z136 Encounter for screening for cardiovascular disorders: Secondary | ICD-10-CM

## 2016-03-06 DIAGNOSIS — E559 Vitamin D deficiency, unspecified: Secondary | ICD-10-CM

## 2016-03-06 DIAGNOSIS — Z79899 Other long term (current) drug therapy: Secondary | ICD-10-CM

## 2016-03-06 DIAGNOSIS — I480 Paroxysmal atrial fibrillation: Secondary | ICD-10-CM | POA: Diagnosis not present

## 2016-03-06 DIAGNOSIS — Z1211 Encounter for screening for malignant neoplasm of colon: Secondary | ICD-10-CM

## 2016-03-06 DIAGNOSIS — R7303 Prediabetes: Secondary | ICD-10-CM

## 2016-03-06 DIAGNOSIS — E782 Mixed hyperlipidemia: Secondary | ICD-10-CM

## 2016-03-06 DIAGNOSIS — I2510 Atherosclerotic heart disease of native coronary artery without angina pectoris: Secondary | ICD-10-CM

## 2016-03-06 DIAGNOSIS — K219 Gastro-esophageal reflux disease without esophagitis: Secondary | ICD-10-CM

## 2016-03-06 DIAGNOSIS — Z125 Encounter for screening for malignant neoplasm of prostate: Secondary | ICD-10-CM

## 2016-03-06 LAB — MAGNESIUM: Magnesium: 1.9 mg/dL (ref 1.5–2.5)

## 2016-03-06 LAB — LIPID PANEL
CHOLESTEROL: 162 mg/dL (ref 125–200)
HDL: 65 mg/dL (ref >=40)
LDL Cholesterol: 85 mg/dL (ref <130)
TRIGLYCERIDES: 62 mg/dL (ref <150)
Total CHOL/HDL Ratio: 2.5 Ratio (ref <=5.0)
VLDL: 12 mg/dL (ref <30)

## 2016-03-06 LAB — CBC WITH DIFFERENTIAL/PLATELET
Basophils Absolute: 57 cells/uL (ref 0–200)
Basophils Relative: 1 %
Eosinophils Absolute: 171 cells/uL (ref 15–500)
Eosinophils Relative: 3 %
HEMATOCRIT: 44.9 % (ref 38.5–50.0)
Hemoglobin: 15.4 g/dL (ref 13.2–17.1)
LYMPHS PCT: 31 %
Lymphs Abs: 1767 cells/uL (ref 850–3900)
MCH: 30.4 pg (ref 27.0–33.0)
MCHC: 34.3 g/dL (ref 32.0–36.0)
MCV: 88.7 fL (ref 80.0–100.0)
MONO ABS: 456 {cells}/uL (ref 200–950)
MONOS PCT: 8 %
MPV: 9.4 fL (ref 7.5–12.5)
NEUTROS PCT: 57 %
Neutro Abs: 3249 cells/uL (ref 1500–7800)
PLATELETS: 178 10*3/uL (ref 140–400)
RBC: 5.06 MIL/uL (ref 4.20–5.80)
RDW: 13 % (ref 11.0–15.0)
WBC: 5.7 10*3/uL (ref 3.8–10.8)

## 2016-03-06 LAB — BASIC METABOLIC PANEL WITH GFR
BUN: 24 mg/dL (ref 7–25)
CALCIUM: 9 mg/dL (ref 8.6–10.3)
CO2: 19 mmol/L — ABNORMAL LOW (ref 20–31)
Chloride: 105 mmol/L (ref 98–110)
Creat: 0.87 mg/dL (ref 0.70–1.18)
GFR, EST NON AFRICAN AMERICAN: 87 mL/min (ref >=60)
GLUCOSE: 87 mg/dL (ref 65–99)
Potassium: 3.9 mmol/L (ref 3.5–5.3)
Sodium: 138 mmol/L (ref 135–146)

## 2016-03-06 LAB — HEPATIC FUNCTION PANEL
ALBUMIN: 4.1 g/dL (ref 3.6–5.1)
ALT: 19 U/L (ref 9–46)
AST: 17 U/L (ref 10–35)
Alkaline Phosphatase: 48 U/L (ref 40–115)
Bilirubin, Direct: 0.3 mg/dL — ABNORMAL HIGH (ref <=0.2)
Indirect Bilirubin: 1.1 mg/dL (ref 0.2–1.2)
TOTAL PROTEIN: 6.3 g/dL (ref 6.1–8.1)
Total Bilirubin: 1.4 mg/dL — ABNORMAL HIGH (ref 0.2–1.2)

## 2016-03-06 LAB — URINALYSIS, ROUTINE W REFLEX MICROSCOPIC
Bilirubin Urine: NEGATIVE
Glucose, UA: NEGATIVE
HGB URINE DIPSTICK: NEGATIVE
Leukocytes, UA: NEGATIVE
NITRITE: NEGATIVE
PH: 6 (ref 5.0–8.0)
PROTEIN: NEGATIVE
Specific Gravity, Urine: 1.023 (ref 1.001–1.035)

## 2016-03-06 LAB — PSA: PSA: 0.6 ng/mL (ref <=4.0)

## 2016-03-06 LAB — MICROALBUMIN / CREATININE URINE RATIO
CREATININE, URINE: 159 mg/dL (ref 20–370)
MICROALB UR: 0.3 mg/dL
MICROALB/CREAT RATIO: 2 ug/mg{creat} (ref <30)

## 2016-03-06 LAB — HEMOGLOBIN A1C
Hgb A1c MFr Bld: 5.3 % (ref <5.7)
MEAN PLASMA GLUCOSE: 105 mg/dL

## 2016-03-06 LAB — TSH: TSH: 0.97 mIU/L (ref 0.40–4.50)

## 2016-03-06 NOTE — Progress Notes (Signed)
[facility]   [name], M.D.    Uvaldo Bristle. Silverio Lay, P.A.-C      Starlyn Skeans, P.A.-C   [place] [id number] Telephone [phone] Telefax [phone]  Annual  Screening/Preventative Visit And Comprehensive Evaluation & Examination with Hypertension, ASHD s/p pAFib/Flutter, ASPVD s/p SFA stent,  OSA/CPAP,  Hyperlipidemia, Pre-Diabetes and Vitamin D Deficiency. Patient also is on CPAP for OSA with reported improved sleep hygiene and improvement in daytime sleepiness.                         Patient is treated for HTN since 1996 & BP has been controlled at home. Today's BP: 132/84 mmHg.  Patient has hx/o pAfib predating to 2004 and had his 1st RFA in Jan 2014 and more recently a 2sd RFA in July 2016. Since then has done well with only an occasional transient brief palpitation.   Patient has had no complaints of any cardiac type chest pain,  dyspnea/orthopnea/PND, dizziness, claudication, or dependent edema.         This very nice 70 y.o.MWM presents for a Wellness/Preventative Visit & comprehensive evaluation and management of multiple medical co-morbidities.  Patient has been followed for HTN, Prediabetes, Hyperlipidemia and Vitamin D Deficiency.     HTN predates since 1996. Patient' has hx/o pAfib predating to 2004 with RFA x 2 in 2014 & 2016.  He is on Xarelto. In May 2015 he had PTCA/Stents followed in several days by Rotatorblator intervention and has done well since that time.  Today's BP: 130/86. Patient denies any cardiac symptoms as chest pain, palpitations, shortness of breath, dizziness or ankle swelling.     Patient's hyperlipidemia is controlled with diet and medications. Patient denies myalgias or other medication SE's. Last lipids were at goal: Lab Results  Component Value Date   CHOL 162 03/06/2016   HDL 65 03/06/2016   LDLCALC 85  03/06/2016   TRIG 62 03/06/2016   CHOLHDL 2.5 03/06/2016      Patient has prediabetes since 2011 with A1c 5.7% and 5.9% in 2013 and patient denies reactive hypoglycemic symptoms, visual blurring, diabetic polys or paresthesias. Last A1c was back in the normal range. Lab Results  Component Value Date   HGBA1C 5.4 12/04/2015       Finally, patient has history of Vitamin D Deficiency of "27" in 2008 and last vitamin D was at goal: Lab Results  Component Value Date   VD25OH 84 12/04/2015   Current Outpatient Prescriptions on File Prior to Visit  Medication Sig  . Ascorbic Acid (VITAMIN C) 1000 MG tablet Take 1,000 mg by mouth daily as needed (vitamin supplement).   . bisoprolol-hydrochlorothiazide (ZIAC) 10-6.25 MG tablet TAKE ONE TABLET BY MOUTH ONCE DAILY  . CRESTOR 40 MG tablet TAKE ONE TABLET BY MOUTH ONCE DAILY  . Multiple Vitamin (MULITIVITAMIN WITH MINERALS) TABS Take 1 tablet by mouth daily. FOR MEN  . NITROSTAT 0.4 MG SL tablet DISSOLVE ONE TABLET UNDER THE TONGUE EVERY 5 MINUTES AS NEEDED FOR CHEST PAIN.  DO NOT EXCEED A TOTAL OF 3 DOSES IN 15 MINUTES  . VITAMIN D, ERGOCALCIFEROL, PO Take 5,000 Units by mouth daily. Take 1 tablet daily  . XARELTO 20 MG TABS tablet TAKE ONE  TABLET BY MOUTH ONCE DAILY WITH SUPPER   No current facility-administered medications on file prior to visit.    Allergies  Allergen Reactions  . Zetia [Ezetimibe] Other (See Comments)    Other reaction(s): Other (See Comments) myalgias    Past Medical History:  Diagnosis Date  . Bladder cancer ([place])   . Hearing loss of both ears   . History of colon polyps    BENIGN  . Hyperlipidemia   . Hypertension   . Mitral regurgitation   . OSA on CPAP    cpap settign of 13  . PAD (peripheral artery disease) (HCC)    ABI--  RIGHT SIDE NORMAL LEFT SIDE MODERATELY REDUCED W/ DISTAL LEFT SFA STENOSIS//  MILD CALDICATION  . PAF (paroxysmal atrial fibrillation) ([place])     a. s/p PVI 2014; b. s/p redo PVI and CTI  01/2015  . Pre-diabetes   . Vitamin D deficiency    Health Maintenance  Topic Date Due  . PNA vac Low Risk Adult (2 of 2 - PPSV23) 07/12/2015  . INFLUENZA VACCINE  02/19/2016  . ZOSTAVAX  06/21/2018 (Originally 05/04/2005)  . Hepatitis C Screening  06/21/2018 (Originally 01/20/1946)  . COLONOSCOPY  04/24/2020  . TETANUS/TDAP  09/23/2023   Immunization History  Administered Date(s) Administered  . DT 07/22/2003, 12/28/2013  . Influenza Whole 05/19/2013  . Influenza,inj,Quad PF,36+ Mos 05/09/2014  . Influenza-Unspecified 05/14/2015  . Pneumococcal Conjugate-13 07/11/2014  . Pneumococcal Polysaccharide-23 12/04/2009  . Tdap 09/22/2013   Past Surgical History:  Procedure Laterality Date  . ABLATION OF DYSRHYTHMIC FOCUS  01/26/2015  . ATRIAL FIBRILLATION ABLATION N/A 07/27/2012   Procedure: ATRIAL FIBRILLATION ABLATION;  Surgeon: [name], MD;  Location: [facility] CATH LAB;  Service: Cardiovascular;  Laterality: N/A;  . CARDIAC ELECTROPHYSIOLOGY STUDY AND ABLATION  07-27-2012  DR ALLRED   SUCCESSFUL ABLATION OF A-FIB  . CARDIOVERSION  08/06/2012   Procedure: CARDIOVERSION;  Surgeon: [name], MD;  Location: [facility] ENDOSCOPY;  Service: Cardiovascular;  Laterality: N/A;  . CARDIOVERSION N/A 08/25/2014   Procedure: CARDIOVERSION;  Surgeon: Larey Dresser, MD;  Location: [place];  Service: Cardiovascular;  Laterality: N/A;  . CARDIOVERSION N/A 11/13/2014   Procedure: CARDIOVERSION;  Surgeon: Sueanne Margarita, MD;  Location: [facility] ENDOSCOPY;  Service: Cardiovascular;  Laterality: N/A;  . CARDIOVERSION N/A 12/28/2014   Procedure: CARDIOVERSION;  Surgeon: [name], MD;  Location: [facility] ENDOSCOPY;  Service: Cardiovascular;  Laterality: N/A;  . CATARACT EXTRACTION W/ INTRAOCULAR LENS  IMPLANT, BILATERAL    . COLONOSCOPY WITH PROPOFOL N/A 02/13/2015   Procedure: COLONOSCOPY WITH PROPOFOL;  Surgeon: Garlan Fair, MD;  Location: [facility] ENDOSCOPY;  Service: Endoscopy;  Laterality: N/A;  . CYSTOSCOPY W/  RETROGRADES Bilateral 04/12/2013   Procedure: CYSTOSCOPY WITH RETROGRADE PYELOGRAM;  Surgeon: [name], MD;  Location: [facility];  Service: Urology;  Laterality: Bilateral;  . ELECTROPHYSIOLOGIC STUDY N/A 01/26/2015   Procedure: Atrial Fibrillation Ablation;  Surgeon: [name], MD;  Location: [place] CV LAB;  Service: Cardiovascular;  Laterality: N/A;  . LEFT HEART CATHETERIZATION WITH CORONARY ANGIOGRAM N/A 11/24/2013   Procedure: LEFT HEART CATHETERIZATION WITH CORONARY ANGIOGRAM;  Surgeon: [name], MD;  Location: [facility] CATH LAB;  Service: Cardiovascular;  Laterality: N/A;  . PERCUTANEOUS CORONARY ROTOBLATOR INTERVENTION (PCI-R) N/A 11/28/2013   Procedure: PERCUTANEOUS CORONARY ROTOBLATOR INTERVENTION (PCI-R);  Surgeon: [name], MD;  Location: [facility] CATH LAB;  Service: Cardiovascular;  Laterality: N/A;  . PERCUTANEOUS CORONARY STENT INTERVENTION (PCI-S)  11/24/2013  Procedure: PERCUTANEOUS CORONARY STENT INTERVENTION (PCI-S);  Surgeon: [name], MD;  Location: [facility] CATH LAB;  Service: Cardiovascular;;  . TEE WITHOUT CARDIOVERSION  07/26/2012   Procedure: TRANSESOPHAGEAL ECHOCARDIOGRAM (TEE);  Surgeon: [name], MD;  Location: [facility] ENDOSCOPY;  Service: Cardiovascular;  Laterality: N/A;  . TEE WITHOUT CARDIOVERSION N/A 08/25/2014   Procedure: TRANSESOPHAGEAL ECHOCARDIOGRAM (TEE);  Surgeon: Larey Dresser, MD;  Location: [place];  Service: Cardiovascular;  Laterality: N/A;  . TEE WITHOUT CARDIOVERSION  01/26/2015   Procedure: Transesophageal Echocardiogram (Tee);  Surgeon: [name], MD;  Location: [place] CV LAB;  Service: Cardiovascular;;  . TONSILLECTOMY  age 15  . TOTAL HIP ARTHROPLASTY  07/25/2011   Procedure: TOTAL HIP ARTHROPLASTY ANTERIOR APPROACH;  Surgeon: Mcarthur Rossetti;  Location: [facility];  Service: Orthopedics;  Laterality: Right;  . TOTAL HIP ARTHROPLASTY Left 02-08-2010  . TRANSURETHRAL RESECTION OF BLADDER  TUMOR WITH GYRUS (TURBT-GYRUS) N/A 04/12/2013   Procedure: TRANSURETHRAL RESECTION OF BLADDER TUMOR WITH GYRUS (TURBT-GYRUS);  Surgeon: [name], MD;  Location: [facility];  Service: Urology;  Laterality: N/A;   Family History  Problem Relation Age of Onset  . Heart attack Mother   . Heart disease Mother   . Heart failure Father   . Heart disease Father    Social History   Social History  . Marital status: Married    Spouse name: N/A  . Number of children: N/A  . Years of education: N/A   Occupational History  . Retired 1999   Social History Main Topics  . Smoking status: Former Smoker    Packs/day: 2.00    Years: 10.00    Types: Cigarettes    Quit date: 09/20/1981  . Smokeless tobacco: Never Used  . Alcohol use No     Comment: pt states he has stopped drinking alcohol  . Drug use: No  . Sexual activity: Active    ROS Constitutional: Denies fever, chills, weight loss/gain, headaches, insomnia,  night sweats or change in appetite. Does c/o fatigue. Eyes: Denies redness, blurred vision, diplopia, discharge, itchy or watery eyes.  ENT: Denies discharge, congestion, post nasal drip, epistaxis, sore throat, earache, hearing loss, dental pain, Tinnitus, Vertigo, Sinus pain or snoring.  Cardio: Denies chest pain, palpitations, irregular heartbeat, syncope, dyspnea, diaphoresis, orthopnea, PND, claudication or edema Respiratory: denies cough, dyspnea, DOE, pleurisy, hoarseness, laryngitis or wheezing.  Gastrointestinal: Denies dysphagia, heartburn, reflux, water brash, pain, cramps, nausea, vomiting, bloating, diarrhea, constipation, hematemesis, melena, hematochezia, jaundice or hemorrhoids Genitourinary: Denies dysuria, frequency, urgency, nocturia, hesitancy, discharge, hematuria or flank pain Musculoskeletal: Denies arthralgia, myalgia, stiffness, Jt. Swelling, pain, limp or strain/sprain. Denies Falls. Skin: Denies puritis, rash, hives, warts, acne, eczema or  change in skin lesion Neuro: No weakness, tremor, incoordination, spasms, paresthesia or pain Psychiatric: Denies confusion, memory loss or sensory loss. Denies Depression. Endocrine: Denies change in weight, skin, hair change, nocturia, and paresthesia, diabetic polys, visual blurring or hyper / hypo glycemic episodes.  Heme/Lymph: No excessive bleeding, bruising or enlarged lymph nodes.  Physical Exam  BP 130/86   Pulse 65   Temp 97.9 F (36.6 C)   Resp 16   Ht 5' 9.5" (1.765 m)   Wt 203 lb 3.2 oz (92.2 kg)   SpO2 95%   BMI 29.58 kg/m   General Appearance: Well nourished, in no apparent distress.  Eyes: PERRLA, EOMs, conjunctiva no swelling or erythema, normal fundi and vessels. Sinuses: No frontal/maxillary tenderness ENT/Mouth: EACs patent / TMs  nl.  Nares clear without erythema, swelling, mucoid exudates. Oral hygiene is good. No erythema, swelling, or exudate. Tongue normal, non-obstructing. Tonsils not swollen or erythematous. Hearing normal.  Neck: Supple, thyroid normal. No bruits, nodes or JVD. Respiratory: Respiratory effort normal.  BS equal and clear bilateral without rales, rhonci, wheezing or stridor. Cardio: Heart sounds are normal with regular rate and rhythm and no murmurs, rubs or gallops. Peripheral pulses are normal and equal bilaterally without edema. No aortic or femoral bruits. Chest: symmetric with normal excursions and percussion.  Abdomen: Soft, with Nl bowel sounds. Nontender, no guarding, rebound, hernias, masses, or organomegaly.  Lymphatics: Non tender without lymphadenopathy.  Genitourinary: No hernias.Testes nl. DRE - prostate nl for age - smooth & firm w/o nodules. Musculoskeletal: Full ROM all peripheral extremities, joint stability, 5/5 strength, and normal gait. Skin: Warm and dry without rashes, lesions, cyanosis, clubbing or  ecchymosis.  Neuro: Cranial nerves intact, reflexes equal bilaterally. Normal muscle tone, no cerebellar symptoms.  Sensation intact.  Pysch: Alert and oriented X 3 with normal affect, insight and judgment appropriate.   Assessment and Plan  1. Annual Preventative/Screening Exam   2. Essential hypertension  - Microalbumin / creatinine urine ratio - EKG 12-Lead - [place], RETROPERITNL ABD,  LTD - TSH  3. Hyperlipidemia  - Lipid panel - TSH  4. Prediabetes  - Hemoglobin A1c - Insulin, random  5. Vitamin D deficiency  - VITAMIN D 25 Hydroxy   6. Sleep apnea   7. Gastroesophageal reflux disease   8. Medication management  - Urinalysis, Routine w reflex microscopic  - CBC with Differential/Platelet - BASIC METABOLIC PANEL WITH GFR - Hepatic function panel - Magnesium  9. Colon cancer screening  - POC Hemoccult Bld/Stl   10. Prostate cancer screening  - PSA  11. Paroxysmal atrial fibrillation (HCC)   12. Screening for ischemic heart disease   13. Atherosclerosis of native coronary artery without angina pectoris   14. Screening for AAA (aortic abdominal aneurysm)      Continue prudent diet as discussed, weight control, BP monitoring, regular exercise, and medications as discussed.  Discussed med effects and SE's. Routine screening labs and tests as requested with regular follow-up as recommended. Over 40 minutes of exam, counseling, chart review and high complex critical decision making was performed

## 2016-03-06 NOTE — Patient Instructions (Signed)

## 2016-03-07 LAB — INSULIN, RANDOM: INSULIN: 5.3 u[IU]/mL (ref 2.0–19.6)

## 2016-03-07 LAB — VITAMIN D 25 HYDROXY (VIT D DEFICIENCY, FRACTURES): VIT D 25 HYDROXY: 81 ng/mL (ref 30–100)

## 2016-04-08 NOTE — Progress Notes (Signed)
Chief Complaint  Patient presents with  . Chest Pain    History of Present Illness: 70 yo male with history of CAD, HTN and paroxysmal atrial fibrillation who is here today for follow up of his CAD. His atrial fibrillation is followed by Dr. [name]. I performed a cardiac cath in May 2015 with rotablator atherectomy of the proximal Circumflex followed by placement of a DES in the Circumflex. FFR of the LAD was not flow limiting.   He is here today for follow up. He feels well overall but he describes substernal burning type chest pain for the last month. This is occurring almost daily and not really worsened with exertion. He had no chest pain prior to his PCU/stenting in 2015. The pain occurs at rest but no association with meals. There is no associated dyspnea, dizziness, nausea or diaphoresis. He walks every day and plays golf often. He is concerned and wishes to have a stress test. No LE edema.   Primary Care Physician: [name], MD  Past Medical History:  Diagnosis Date  . Bladder cancer ([place])   . Hearing loss of both ears   . History of colon polyps    BENIGN  . Hyperlipidemia   . Hypertension   . Mitral regurgitation   . OSA on CPAP    cpap settign of 13  . PAD (peripheral artery disease) (HCC)    ABI--  RIGHT SIDE NORMAL LEFT SIDE MODERATELY REDUCED W/ DISTAL LEFT SFA STENOSIS//  MILD CALDICATION  . PAF (paroxysmal atrial fibrillation) ([place])     a. s/p PVI 2014; b. s/p redo PVI and CTI 01/2015  . Pre-diabetes   . Vitamin D deficiency     Past Surgical History:  Procedure Laterality Date  . ABLATION OF DYSRHYTHMIC FOCUS  01/26/2015  . ATRIAL FIBRILLATION ABLATION N/A 07/27/2012   Procedure: ATRIAL FIBRILLATION ABLATION;  Surgeon: [name], MD;  Location: [facility] CATH LAB;  Service: Cardiovascular;  Laterality: N/A;  . CARDIAC ELECTROPHYSIOLOGY STUDY AND ABLATION  07-27-2012  DR ALLRED   SUCCESSFUL ABLATION OF A-FIB  . CARDIOVERSION  08/06/2012   Procedure:  CARDIOVERSION;  Surgeon: [name], MD;  Location: [facility] ENDOSCOPY;  Service: Cardiovascular;  Laterality: N/A;  . CARDIOVERSION N/A 08/25/2014   Procedure: CARDIOVERSION;  Surgeon: Larey Dresser, MD;  Location: [place];  Service: Cardiovascular;  Laterality: N/A;  . CARDIOVERSION N/A 11/13/2014   Procedure: CARDIOVERSION;  Surgeon: Sueanne Margarita, MD;  Location: [facility] ENDOSCOPY;  Service: Cardiovascular;  Laterality: N/A;  . CARDIOVERSION N/A 12/28/2014   Procedure: CARDIOVERSION;  Surgeon: [name], MD;  Location: [facility] ENDOSCOPY;  Service: Cardiovascular;  Laterality: N/A;  . CATARACT EXTRACTION W/ INTRAOCULAR LENS  IMPLANT, BILATERAL    . COLONOSCOPY WITH PROPOFOL N/A 02/13/2015   Procedure: COLONOSCOPY WITH PROPOFOL;  Surgeon: Garlan Fair, MD;  Location: [facility] ENDOSCOPY;  Service: Endoscopy;  Laterality: N/A;  . CYSTOSCOPY W/ RETROGRADES Bilateral 04/12/2013   Procedure: CYSTOSCOPY WITH RETROGRADE PYELOGRAM;  Surgeon: [name], MD;  Location: [facility];  Service: Urology;  Laterality: Bilateral;  . ELECTROPHYSIOLOGIC STUDY N/A 01/26/2015   Procedure: Atrial Fibrillation Ablation;  Surgeon: [name], MD;  Location: [place] CV LAB;  Service: Cardiovascular;  Laterality: N/A;  . LEFT HEART CATHETERIZATION WITH CORONARY ANGIOGRAM N/A 11/24/2013   Procedure: LEFT HEART CATHETERIZATION WITH CORONARY ANGIOGRAM;  Surgeon: [name], MD;  Location: [facility] CATH LAB;  Service: Cardiovascular;  Laterality: N/A;  . PERCUTANEOUS CORONARY ROTOBLATOR INTERVENTION (  PCI-R) N/A 11/28/2013   Procedure: PERCUTANEOUS CORONARY ROTOBLATOR INTERVENTION (PCI-R);  Surgeon: [name], MD;  Location: [facility] CATH LAB;  Service: Cardiovascular;  Laterality: N/A;  . PERCUTANEOUS CORONARY STENT INTERVENTION (PCI-S)  11/24/2013   Procedure: PERCUTANEOUS CORONARY STENT INTERVENTION (PCI-S);  Surgeon: [name], MD;  Location: [facility] CATH LAB;  Service: Cardiovascular;;  . TEE  WITHOUT CARDIOVERSION  07/26/2012   Procedure: TRANSESOPHAGEAL ECHOCARDIOGRAM (TEE);  Surgeon: [name], MD;  Location: [facility] ENDOSCOPY;  Service: Cardiovascular;  Laterality: N/A;  . TEE WITHOUT CARDIOVERSION N/A 08/25/2014   Procedure: TRANSESOPHAGEAL ECHOCARDIOGRAM (TEE);  Surgeon: Larey Dresser, MD;  Location: [place];  Service: Cardiovascular;  Laterality: N/A;  . TEE WITHOUT CARDIOVERSION  01/26/2015   Procedure: Transesophageal Echocardiogram (Tee);  Surgeon: [name], MD;  Location: [place] CV LAB;  Service: Cardiovascular;;  . TONSILLECTOMY  age 59  . TOTAL HIP ARTHROPLASTY  07/25/2011   Procedure: TOTAL HIP ARTHROPLASTY ANTERIOR APPROACH;  Surgeon: Mcarthur Rossetti;  Location: [facility];  Service: Orthopedics;  Laterality: Right;  . TOTAL HIP ARTHROPLASTY Left 02-08-2010  . TRANSURETHRAL RESECTION OF BLADDER TUMOR WITH GYRUS (TURBT-GYRUS) N/A 04/12/2013   Procedure: TRANSURETHRAL RESECTION OF BLADDER TUMOR WITH GYRUS (TURBT-GYRUS);  Surgeon: [name], MD;  Location: [facility];  Service: Urology;  Laterality: N/A;    Current Outpatient Prescriptions  Medication Sig Dispense Refill  . Ascorbic Acid (VITAMIN C) 1000 MG tablet Take 1,000 mg by mouth daily as needed (vitamin supplement).     . bisoprolol-hydrochlorothiazide (ZIAC) 10-6.25 MG tablet TAKE ONE TABLET BY MOUTH ONCE DAILY 90 tablet 1  . CRESTOR 40 MG tablet TAKE ONE TABLET BY MOUTH ONCE DAILY 90 tablet 1  . Multiple Vitamin (MULITIVITAMIN WITH MINERALS) TABS Take 1 tablet by mouth daily. FOR MEN    . NITROSTAT 0.4 MG SL tablet DISSOLVE ONE TABLET UNDER THE TONGUE EVERY 5 MINUTES AS NEEDED FOR CHEST PAIN.  DO NOT EXCEED A TOTAL OF 3 DOSES IN 15 MINUTES 25 tablet 6  . VITAMIN D, ERGOCALCIFEROL, PO Take 5,000 Units by mouth daily. Take 1 tablet daily    . XARELTO 20 MG TABS tablet TAKE ONE TABLET BY MOUTH ONCE DAILY WITH SUPPER 30 tablet 6   No current facility-administered medications for  this visit.     Allergies  Allergen Reactions  . Zetia [Ezetimibe] Other (See Comments)    Other reaction(s): Other (See Comments) myalgias     Social History   Social History  . Marital status: Married    Spouse name: N/A  . Number of children: N/A  . Years of education: N/A   Occupational History  . Not on file.   Social History Main Topics  . Smoking status: Former Smoker    Packs/day: 2.00    Years: 10.00    Types: Cigarettes    Quit date: 09/20/1981  . Smokeless tobacco: Never Used  . Alcohol use No     Comment: pt states he has stopped drinking alcohol  . Drug use: No  . Sexual activity: Not on file   Other Topics Concern  . Not on file   Social History Narrative  . No narrative on file    Family History  Problem Relation Age of Onset  . Heart attack Mother   . Heart disease Mother   . Heart failure Father   . Heart disease Father     Review of Systems:  As stated in the HPI and otherwise  negative.   BP (!) 90/58 (BP Location: Right Arm, Patient Position: Sitting, Cuff Size: Normal)   Pulse 64   Ht 5' 9.5" (1.765 m)   Wt 90.6 kg (199 lb 12.8 oz)   BMI 29.08 kg/m   Physical Examination: General: Well developed, well nourished, NAD  HEENT: OP clear, mucus membranes moist  SKIN: warm, dry. No rashes. Neuro: No focal deficits  Musculoskeletal: Muscle strength 5/5 all ext  Psychiatric: Mood and affect normal  Neck: No JVD, no carotid bruits, no thyromegaly, no lymphadenopathy.  Lungs:Clear bilaterally, no wheezes, rhonci, crackles Cardiovascular: Regular rate and rhythm. No murmurs, gallops or rubs. Abdomen:Soft. Bowel sounds present. Non-tender.  Extremities: No lower extremity edema. Pulses are 2 + in the bilateral DP/PT.  Cardiac cath 11/24/13: Angiographic Findings: Left main: Diffuse proximal and mid 20-30% stenosis.  Left Anterior Descending Artery: Large caliber vessel that courses to the apex. The vessel is heavily calcified in the  proximal and mid segments. The mid vessel has a 50% heavily calcified stenosis. The distal vessel has diffuse plaque. The first diagonal branch is very small in caliber with ostial 50% stenosis. The second diagonal branch is moderate in caliber with mild plaque.  Circumflex Artery: Large vessel with heavy calcification in the proximal segment. There is a 99% proximal stenosis just beyond the ostium. The first OM branch is patent with mild plaque. The continuation of the AV groove Circumflex has a 40% stenosis.  Right Coronary Artery: Large dominant vessel with diffuse 20% stenosis, moderate proximal and mid calcification.  Left Ventricular Angiogram: LVEF=55-60%.   EKG:  EKG is not ordered today. The ekg ordered today demonstrates   Recent Labs: 03/06/2016: ALT 19; BUN 24; Creat 0.87; Hemoglobin 15.4; Magnesium 1.9; Platelets 178; Potassium 3.9; Sodium 138; TSH 0.97   Lipid Panel    Component Value Date/Time   CHOL 162 03/06/2016 0920   TRIG 62 03/06/2016 0920   HDL 65 03/06/2016 0920   CHOLHDL 2.5 03/06/2016 0920   VLDL 12 03/06/2016 0920   LDLCALC 85 03/06/2016 0920     Wt Readings from Last 3 Encounters:  04/09/16 90.6 kg (199 lb 12.8 oz)  03/06/16 92.2 kg (203 lb 3.2 oz)  01/04/16 91.7 kg (202 lb 3.2 oz)     Other studies Reviewed: Additional studies/ records that were reviewed today include:  Review of the above records demonstrates:    Assessment and Plan:   1. CAD: He has substernal chest pain at rest without clear exertional component. He has known CAD with prior stenting of the Circumflex and moderate calcified LAD stenosis with FFR of 0.87 in May 2015. His ASA and Plavix have been stopped since he is on Xarelto. Will arrange exercise nuclear stress test to exclude ischemia. Will continue statin and beta blocker.   2. Atrial fibrillation, paroxysmal: Sinus today on beta blocker. He is on Xarelto. Followed in atrial fib clinic by Dr. [name]  3. HTN: BP controlled. No  changes.   Current medicines are reviewed at length with the patient today.  The patient does not have concerns regarding medicines.  The following changes have been made:  no change  Labs/ tests ordered today include:   Orders Placed This Encounter  Procedures  . Myocardial Perfusion Imaging     Disposition:   FU with me in 12  months  Signed, Lauree Chandler, MD 04/09/2016 9:04 AM    [facility] [address], [place], [place]  [address] Phone: [phone];  Fax: [phone]

## 2016-04-09 ENCOUNTER — Encounter: Payer: Self-pay | Admitting: Cardiovascular Disease

## 2016-04-09 ENCOUNTER — Ambulatory Visit (INDEPENDENT_AMBULATORY_CARE_PROVIDER_SITE_OTHER): Payer: Medicare Other | Admitting: Cardiovascular Disease

## 2016-04-09 VITALS — BP 90/58 | HR 64 | Ht 69.5 in | Wt 199.8 lb

## 2016-04-09 DIAGNOSIS — I480 Paroxysmal atrial fibrillation: Secondary | ICD-10-CM | POA: Diagnosis not present

## 2016-04-09 DIAGNOSIS — I25110 Atherosclerotic heart disease of native coronary artery with unstable angina pectoris: Secondary | ICD-10-CM

## 2016-04-09 NOTE — Patient Instructions (Signed)
Medication Instructions:  Your physician recommends that you continue on your current medications as directed. Please refer to the Current Medication list given to you today.   Labwork: none  Testing/Procedures: Your physician has requested that you have an exercise stress myoview. For further information please visit [email or web address]. Please follow instruction sheet, as given.   Follow-Up: Your physician wants you to follow-up in: 12 months.  You will receive a reminder letter in the mail two months in advance. If you don't receive a letter, please call our office to schedule the follow-up appointment.   Any Other Special Instructions Will Be Listed Below (If Applicable).     If you need a refill on your cardiac medications before your next appointment, please call your pharmacy.   

## 2016-04-16 ENCOUNTER — Ambulatory Visit (INDEPENDENT_AMBULATORY_CARE_PROVIDER_SITE_OTHER): Payer: Medicare Other | Admitting: Physician Assistant

## 2016-04-16 ENCOUNTER — Encounter: Payer: Self-pay | Admitting: Physician Assistant

## 2016-04-16 ENCOUNTER — Telehealth (HOSPITAL_COMMUNITY): Payer: Self-pay | Admitting: *Deleted

## 2016-04-16 VITALS — BP 110/72 | HR 67 | Temp 97.3°F | Resp 14 | Ht 69.5 in | Wt 203.0 lb

## 2016-04-16 DIAGNOSIS — K2100 Gastro-esophageal reflux disease with esophagitis, without bleeding: Secondary | ICD-10-CM

## 2016-04-16 DIAGNOSIS — R1013 Epigastric pain: Secondary | ICD-10-CM | POA: Diagnosis not present

## 2016-04-16 LAB — CBC WITH DIFFERENTIAL/PLATELET
Basophils Absolute: 0 {cells}/uL (ref 0–200)
Basophils Relative: 0 %
Eosinophils Absolute: 192 {cells}/uL (ref 15–500)
Eosinophils Relative: 3 %
HCT: 43.8 % (ref 38.5–50.0)
Hemoglobin: 15.2 g/dL (ref 13.2–17.1)
Lymphocytes Relative: 28 %
Lymphs Abs: 1792 {cells}/uL (ref 850–3900)
MCH: 30.6 pg (ref 27.0–33.0)
MCHC: 34.7 g/dL (ref 32.0–36.0)
MCV: 88.1 fL (ref 80.0–100.0)
MPV: 9.2 fL (ref 7.5–12.5)
Monocytes Absolute: 640 {cells}/uL (ref 200–950)
Monocytes Relative: 10 %
Neutro Abs: 3776 {cells}/uL (ref 1500–7800)
Neutrophils Relative %: 59 %
Platelets: 187 K/uL (ref 140–400)
RBC: 4.97 MIL/uL (ref 4.20–5.80)
RDW: 13.3 % (ref 11.0–15.0)
WBC: 6.4 K/uL (ref 3.8–10.8)

## 2016-04-16 MED ORDER — SUCRALFATE 1 G PO TABS: 1.0000 g | ORAL_TABLET | Freq: Two times a day (BID) | ORAL | 1 refills | Status: DC

## 2016-04-16 NOTE — Telephone Encounter (Signed)
Patient given detailed instructions per Myocardial Perfusion Study Information Sheet for the test on 04/17/16. Patient notified to arrive 15 minutes early and that it is imperative to arrive on time for appointment to keep from having the test rescheduled.  If you need to cancel or reschedule your appointment, please call the office within 24 hours of your appointment. Failure to do so may result in a cancellation of your appointment, and a $50 no show fee. Patient verbalized understanding. Damon [name]    

## 2016-04-16 NOTE — Progress Notes (Signed)
Subjective:    Patient ID: Damon [name], male    DOB: 12/08/1945, 70 y.o.   MRN: [id number]  HPI 70 y.o. WM presents with substernal chest pain radiates to right posterior shoulder blade x 2-3 months, getting worse/more constant.  When he swallows sharp/crunchy things feels some pain in his throat/substernal area. Not eating makes it worse, eating makes it better at times. Has been having GERD last few months, has woken him up in the middle of the night. Has tried tums, antiacid meds over the counter that did not help. No nausea, decreased appetitie, fever, chills, diarrhea, constipation, black stool, blood in stool. No NSAIDS, no ETOH, no traveling other than [place], has colonoscopy next Monday in [place].   Normal echo 2016, normal stress test 2015  Blood pressure 110/72, pulse 67, temperature 97.3 F (36.3 C), resp. rate 14, height 5' 9.5" (1.765 m), weight 203 lb (92.1 kg), SpO2 97 %.  Medications Current Outpatient Prescriptions on File Prior to Visit  Medication Sig  . bisoprolol-hydrochlorothiazide (ZIAC) 10-6.25 MG tablet TAKE ONE TABLET BY MOUTH ONCE DAILY  . CRESTOR 40 MG tablet TAKE ONE TABLET BY MOUTH ONCE DAILY  . Multiple Vitamin (MULITIVITAMIN WITH MINERALS) TABS Take 1 tablet by mouth daily. FOR MEN  . NITROSTAT 0.4 MG SL tablet DISSOLVE ONE TABLET UNDER THE TONGUE EVERY 5 MINUTES AS NEEDED FOR CHEST PAIN.  DO NOT EXCEED A TOTAL OF 3 DOSES IN 15 MINUTES  . VITAMIN D, ERGOCALCIFEROL, PO Take 5,000 Units by mouth daily. Take 1 tablet daily  . XARELTO 20 MG TABS tablet TAKE ONE TABLET BY MOUTH ONCE DAILY WITH SUPPER  . Ascorbic Acid (VITAMIN C) 1000 MG tablet Take 1,000 mg by mouth daily as needed (vitamin supplement).    No current facility-administered medications on file prior to visit.     Problem list He has Degenerative arthritis of hip; Atrial fibrillation ([place]); Hypertension; Sleep apnea; Mitral regurgitation; Peripheral arterial disease ([place]); Hyperlipidemia;  Prediabetes; Vitamin D deficiency; CAD- CFX PTCA 11/24/13 with plans for DES 11/28/13; Medication management; Bladder cancer ([place]); GERD ; Encounter for Medicare annual wellness exam; and Encounter for general adult medical examination with abnormal findings on his problem list.  Social History  Substance Use Topics  . Smoking status: Former Smoker    Packs/day: 2.00    Years: 10.00    Types: Cigarettes    Quit date: 09/20/1981  . Smokeless tobacco: Never Used  . Alcohol use No     Comment: pt states he has stopped drinking alcohol   Review of Systems  Constitutional: Negative for chills, fatigue and fever.  HENT: Negative.   Respiratory: Negative.   Cardiovascular: Negative.   Gastrointestinal: Positive for abdominal pain (with GERD, radiation to back). Negative for abdominal distention, anal bleeding, blood in stool, constipation, diarrhea, nausea, rectal pain and vomiting.  Genitourinary: Negative.   Musculoskeletal: Negative.   Neurological: Negative.        Objective:   Physical Exam  Constitutional: He is oriented to person, place, and time. He appears well-developed and well-nourished.  HENT:  Head: Normocephalic and atraumatic.  Right Ear: External ear normal.  Left Ear: External ear normal.  Mouth/Throat: Oropharynx is clear and moist.  Eyes: Conjunctivae and EOM are normal. Pupils are equal, round, and reactive to light.  Neck: Normal range of motion. Neck supple.  Cardiovascular: Normal rate, regular rhythm and normal heart sounds.   Pulmonary/Chest: Effort normal and breath sounds normal. He exhibits no tenderness.  Abdominal: Soft. Bowel sounds are normal. He exhibits no distension and no mass. There is no tenderness. There is no rebound and no guarding.  Musculoskeletal: Normal range of motion.  Neurological: He is alert and oriented to person, place, and time. No cranial nerve deficit.  Skin: Skin is warm and dry.  Psychiatric: He has a normal mood and affect. His  behavior is normal.       Assessment & Plan:  GERD/epigastric pain Benign AB exam, negative murphy's, check labs, will refer to GI for EGD -     Hepatic function panel -     CBC with Differential/Platelet -     Amylase -     Ambulatory referral to Gastroenterology -     sucralfate (CARAFATE) 1 g tablet; Take 1 tablet (1 g total) by mouth 2 (two) times daily with a meal. - Dexilant samples given (10 pills)

## 2016-04-16 NOTE — Patient Instructions (Signed)
Take dexilant in the morning Can take carafate as needed to coat your stomach before food  Will refer to GI for EGD to rule out H pylori, hiatal hernia, GERD, etc.   Food Choices for Gastroesophageal Reflux Disease, Adult When you have gastroesophageal reflux disease (GERD), the foods you eat and your eating habits are very important. Choosing the right foods can help ease the discomfort of GERD. WHAT GENERAL GUIDELINES DO I NEED TO FOLLOW?  Choose fruits, vegetables, whole grains, low-fat dairy products, and low-fat meat, fish, and poultry.  Limit fats such as oils, salad dressings, butter, nuts, and avocado.  Keep a food diary to identify foods that cause symptoms.  Avoid foods that cause reflux. These may be different for different people.  Eat frequent small meals instead of three large meals each day.  Eat your meals slowly, in a relaxed setting.  Limit fried foods.  Cook foods using methods other than frying.  Avoid drinking alcohol.  Avoid drinking large amounts of liquids with your meals.  Avoid bending over or lying down until 2-3 hours after eating. WHAT FOODS ARE NOT RECOMMENDED? The following are some foods and drinks that may worsen your symptoms: Vegetables Tomatoes. Tomato juice. Tomato and spaghetti sauce. Chili peppers. Onion and garlic. Horseradish. Fruits Oranges, grapefruit, and lemon (fruit and juice). Meats High-fat meats, fish, and poultry. This includes hot dogs, ribs, ham, sausage, salami, and bacon. Dairy Whole milk and chocolate milk. Sour cream. Cream. Butter. Ice cream. Cream cheese.  Beverages Coffee and tea, with or without caffeine. Carbonated beverages or energy drinks. Condiments Hot sauce. Barbecue sauce.  Sweets/Desserts Chocolate and cocoa. Donuts. Peppermint and spearmint. Fats and Oils High-fat foods, including [place] fries and potato chips. Other Vinegar. Strong spices, such as black pepper, white pepper, red pepper, cayenne,  curry powder, cloves, ginger, and chili powder. The items listed above may not be a complete list of foods and beverages to avoid. Contact your dietitian for more information.   This information is not intended to replace advice given to you by your health care provider. Make sure you discuss any questions you have with your health care provider.   Document Released: 07/07/2005 Document Revised: 07/28/2014 Document Reviewed: 05/11/2013 Elsevier Interactive Patient Education [employer].

## 2016-04-17 ENCOUNTER — Ambulatory Visit (HOSPITAL_COMMUNITY): Payer: Medicare Other | Attending: Cardiology

## 2016-04-17 DIAGNOSIS — I25110 Atherosclerotic heart disease of native coronary artery with unstable angina pectoris: Secondary | ICD-10-CM | POA: Insufficient documentation

## 2016-04-17 LAB — MYOCARDIAL PERFUSION IMAGING
CHL CUP NUCLEAR SSS: 3
CHL RATE OF PERCEIVED EXERTION: 19
Estimated workload: 8.5 METS
Exercise duration (min): 7 min
Exercise duration (sec): 0 s
LHR: 0.35
LVDIAVOL: 121 mL (ref 62–150)
LVSYSVOL: 47 mL
MPHR: 150 {beats}/min
NUC STRESS TID: 0.94
Peak HR: 153 {beats}/min
Percent HR: 102 %
Rest HR: 61 {beats}/min
SDS: 2
SRS: 1

## 2016-04-17 LAB — HEPATIC FUNCTION PANEL
ALBUMIN: 4.4 g/dL (ref 3.6–5.1)
ALK PHOS: 54 U/L (ref 40–115)
ALT: 21 U/L (ref 9–46)
AST: 22 U/L (ref 10–35)
BILIRUBIN INDIRECT: 1.1 mg/dL (ref 0.2–1.2)
Bilirubin, Direct: 0.3 mg/dL — ABNORMAL HIGH (ref <=0.2)
TOTAL PROTEIN: 6.3 g/dL (ref 6.1–8.1)
Total Bilirubin: 1.4 mg/dL — ABNORMAL HIGH (ref 0.2–1.2)

## 2016-04-17 LAB — AMYLASE: Amylase: 51 U/L (ref 0–105)

## 2016-04-17 MED ORDER — TECHNETIUM TC 99M TETROFOSMIN IV KIT
32.5000 | PACK | Freq: Once | INTRAVENOUS | Status: AC | PRN
  Administered 2016-04-17: 33 via INTRAVENOUS
  Filled 2016-04-17: qty 33

## 2016-04-17 MED ORDER — TECHNETIUM TC 99M TETROFOSMIN IV KIT
10.2000 | PACK | Freq: Once | INTRAVENOUS | Status: AC | PRN
  Administered 2016-04-17: 10 via INTRAVENOUS
  Filled 2016-04-17: qty 10

## 2016-04-21 ENCOUNTER — Telehealth: Payer: Self-pay | Admitting: Cardiovascular Disease

## 2016-04-21 NOTE — Telephone Encounter (Signed)
Notes Recorded by [name], MD on 04/18/2016 at 5:21 PM EDT Normal stress test. cdm   Notified the pt of normal stress test results per Dr Angelena Form, as mentioned above.  Pt verbalized understanding.

## 2016-04-21 NOTE — Telephone Encounter (Signed)
New Message ° °Pt voiced he is returning nurses call. ° °Please f/u °

## 2016-04-28 ENCOUNTER — Other Ambulatory Visit: Payer: Self-pay | Admitting: Nurse Practitioner

## 2016-05-13 ENCOUNTER — Encounter: Payer: Self-pay | Admitting: Internal Medicine

## 2016-05-25 ENCOUNTER — Encounter: Payer: Self-pay | Admitting: *Deleted

## 2016-06-10 ENCOUNTER — Encounter: Payer: Self-pay | Admitting: Internal Medicine

## 2016-06-10 ENCOUNTER — Ambulatory Visit (INDEPENDENT_AMBULATORY_CARE_PROVIDER_SITE_OTHER): Payer: Medicare Other | Admitting: Internal Medicine

## 2016-06-10 VITALS — BP 118/62 | HR 66 | Temp 98.4°F | Resp 16 | Ht 69.5 in | Wt 206.0 lb

## 2016-06-10 DIAGNOSIS — R7303 Prediabetes: Secondary | ICD-10-CM

## 2016-06-10 DIAGNOSIS — C679 Malignant neoplasm of bladder, unspecified: Secondary | ICD-10-CM | POA: Diagnosis not present

## 2016-06-10 DIAGNOSIS — Z23 Encounter for immunization: Secondary | ICD-10-CM | POA: Diagnosis not present

## 2016-06-10 DIAGNOSIS — I10 Essential (primary) hypertension: Secondary | ICD-10-CM

## 2016-06-10 DIAGNOSIS — Z79899 Other long term (current) drug therapy: Secondary | ICD-10-CM | POA: Diagnosis not present

## 2016-06-10 DIAGNOSIS — I739 Peripheral vascular disease, unspecified: Secondary | ICD-10-CM | POA: Diagnosis not present

## 2016-06-10 DIAGNOSIS — I480 Paroxysmal atrial fibrillation: Secondary | ICD-10-CM | POA: Diagnosis not present

## 2016-06-10 DIAGNOSIS — E559 Vitamin D deficiency, unspecified: Secondary | ICD-10-CM | POA: Diagnosis not present

## 2016-06-10 DIAGNOSIS — E782 Mixed hyperlipidemia: Secondary | ICD-10-CM | POA: Diagnosis not present

## 2016-06-10 LAB — CBC WITH DIFFERENTIAL/PLATELET
Basophils Absolute: 59 cells/uL (ref 0–200)
Basophils Relative: 1 %
Eosinophils Absolute: 177 cells/uL (ref 15–500)
Eosinophils Relative: 3 %
HEMATOCRIT: 45.4 % (ref 38.5–50.0)
Hemoglobin: 15 g/dL (ref 13.2–17.1)
LYMPHS PCT: 26 %
Lymphs Abs: 1534 cells/uL (ref 850–3900)
MCH: 30 pg (ref 27.0–33.0)
MCHC: 33 g/dL (ref 32.0–36.0)
MCV: 90.8 fL (ref 80.0–100.0)
MONO ABS: 531 {cells}/uL (ref 200–950)
MONOS PCT: 9 %
MPV: 9.2 fL (ref 7.5–12.5)
NEUTROS PCT: 61 %
Neutro Abs: 3599 cells/uL (ref 1500–7800)
PLATELETS: 209 10*3/uL (ref 140–400)
RBC: 5 MIL/uL (ref 4.20–5.80)
RDW: 13.4 % (ref 11.0–15.0)
WBC: 5.9 10*3/uL (ref 3.8–10.8)

## 2016-06-10 LAB — BASIC METABOLIC PANEL WITH GFR
BUN: 16 mg/dL (ref 7–25)
CALCIUM: 9 mg/dL (ref 8.6–10.3)
CO2: 24 mmol/L (ref 20–31)
Chloride: 108 mmol/L (ref 98–110)
Creat: 0.84 mg/dL (ref 0.70–1.18)
GFR, EST NON AFRICAN AMERICAN: 88 mL/min (ref >=60)
GLUCOSE: 85 mg/dL (ref 65–99)
Potassium: 3.9 mmol/L (ref 3.5–5.3)
Sodium: 141 mmol/L (ref 135–146)

## 2016-06-10 LAB — HEPATIC FUNCTION PANEL
ALBUMIN: 4.2 g/dL (ref 3.6–5.1)
ALT: 21 U/L (ref 9–46)
AST: 18 U/L (ref 10–35)
Alkaline Phosphatase: 43 U/L (ref 40–115)
BILIRUBIN TOTAL: 1.8 mg/dL — AB (ref 0.2–1.2)
Bilirubin, Direct: 0.3 mg/dL — ABNORMAL HIGH (ref <=0.2)
Indirect Bilirubin: 1.5 mg/dL — ABNORMAL HIGH (ref 0.2–1.2)
TOTAL PROTEIN: 6.4 g/dL (ref 6.1–8.1)

## 2016-06-10 NOTE — Progress Notes (Signed)
Assessment and Plan:  Hypertension:  -Continue medication,  -monitor blood pressure at home.  -Continue DASH diet.   -Reminder to go to the ER if any CP, SOB, nausea, dizziness, severe HA, changes vision/speech, left arm numbness and tingling, and jaw pain.  Cholesterol: -Continue diet and exercise.  -Check cholesterol.   Pre-diabetes: -Continue diet and exercise.  -Check A1C  Vitamin D Def: -check level -continue medications.   Abdominal pain -suggested that this could be related to constipation and offered both UA and culture and also an abdominal xray to look for possible stool burden.  Patient would like to try increasing fiber intake and keep journal and do some watchful waiting.  He will let us know if worsening symptoms.   He did just have normal colonoscopy and cystoscopy.  Bladder Cancer -followed by urology -in remission  Atrial Fibrillation -cont rate control -cont NOAC  PAD -cont cholesterol control with diet -cont BP and BS control  Continue diet and meds as discussed. Further disposition pending results of labs.  HPI 70 y.o. male  presents for 3 month follow up with hypertension, hyperlipidemia, prediabetes and vitamin D.   His blood pressure has been controlled at home, today their BP is BP: 118/62.   He does not workout. He denies chest pain, shortness of breath, dizziness.   He is not on cholesterol medication and denies myalgias. His cholesterol is not at goal. The cholesterol last visit was:   Lab Results  Component Value Date   CHOL 162 03/06/2016   HDL 65 03/06/2016   LDLCALC 85 03/06/2016   TRIG 62 03/06/2016   CHOLHDL 2.5 03/06/2016     He has been working on diet and exercise for prediabetes, and denies foot ulcerations, hyperglycemia, hypoglycemia , increased appetite, nausea, paresthesia of the feet, polydipsia, polyuria, visual disturbances, vomiting and weight loss. Last A1C in the office was:  Lab Results  Component Value Date   HGBA1C 5.3 03/06/2016    Patient is on Vitamin D supplement.  Lab Results  Component Value Date   VD25OH 97 03/06/2016      Patient reports that he has been having some difficulty with some discomfort in the prostate area.  He reports that he has seen urology recently and they felt that it was enlarged.  It feels like a dull aching sensation.  It is worse when he is driving in a car.  No pain with activity.  He reports that bowel movements have been pretty much normal.  Not hard to pass, not straining, no diarrhea   He did have normal cystoscopy last month with urology.  He also had a normal colonoscopy last month.    Current Medications:  Current Outpatient Prescriptions on File Prior to Visit  Medication Sig Dispense Refill  . Ascorbic Acid (VITAMIN C) 1000 MG tablet Take 1,000 mg by mouth daily as needed (vitamin supplement).     . bisoprolol-hydrochlorothiazide (ZIAC) 10-6.25 MG tablet TAKE ONE TABLET BY MOUTH ONCE DAILY 90 tablet 1  . CRESTOR 40 MG tablet TAKE ONE TABLET BY MOUTH ONCE DAILY 90 tablet 1  . Multiple Vitamin (MULITIVITAMIN WITH MINERALS) TABS Take 1 tablet by mouth daily. FOR MEN    . NITROSTAT 0.4 MG SL tablet DISSOLVE ONE TABLET UNDER THE TONGUE EVERY 5 MINUTES AS NEEDED FOR CHEST PAIN.  DO NOT EXCEED A TOTAL OF 3 DOSES IN 15 MINUTES 25 tablet 6  . VITAMIN D, ERGOCALCIFEROL, PO Take 5,000 Units by mouth daily. Take 1 tablet  daily    . XARELTO 20 MG TABS tablet TAKE ONE TABLET BY MOUTH ONCE DAILY WITH SUPPER 90 tablet 2   No current facility-administered medications on file prior to visit.     Medical History:  Past Medical History:  Diagnosis Date  . Bladder cancer ([place])   . Hearing loss of both ears   . History of colon polyps    BENIGN  . Hyperlipidemia   . Hypertension   . Mitral regurgitation   . OSA on CPAP    cpap settign of 13  . PAD (peripheral artery disease) ([facility])    ABI--  RIGHT SIDE NORMAL LEFT SIDE MODERATELY REDUCED W/ DISTAL LEFT SFA  STENOSIS//  MILD CALDICATION  . PAF (paroxysmal atrial fibrillation) ([place])     a. s/p PVI 2014; b. s/p redo PVI and CTI 01/2015  . Pre-diabetes   . Vitamin D deficiency     Allergies:  Allergies  Allergen Reactions  . Zetia [Ezetimibe] Other (See Comments)    Other reaction(s): Other (See Comments) myalgias      Review of Systems:  Review of Systems  Constitutional: Negative for chills, fever and malaise/fatigue.  HENT: Negative for congestion, ear pain and sore throat.   Eyes: Negative.   Respiratory: Negative for cough, shortness of breath and wheezing.   Cardiovascular: Negative for chest pain, palpitations and leg swelling.  Gastrointestinal: Negative for abdominal pain, blood in stool, constipation, diarrhea, heartburn and melena.  Genitourinary: Negative.   Skin: Negative.   Neurological: Negative for dizziness, sensory change, loss of consciousness and headaches.  Psychiatric/Behavioral: Negative for depression. The patient is not nervous/anxious and does not have insomnia.     Family history- Review and unchanged  Social history- Review and unchanged  Physical Exam: BP 118/62   Pulse 66   Temp 98.4 F (36.9 C) (Temporal)   Resp 16   Ht 5' 9.5" (1.765 m)   Wt 206 lb (93.4 kg)   BMI 29.98 kg/m  Wt Readings from Last 3 Encounters:  06/10/16 206 lb (93.4 kg)  04/16/16 203 lb (92.1 kg)  04/09/16 199 lb 12.8 oz (90.6 kg)    General Appearance: Well nourished well developed, in no apparent distress. Eyes: PERRLA, EOMs, conjunctiva no swelling or erythema ENT/Mouth: Ear canals normal without obstruction, swelling, erythma, discharge.  TMs normal bilaterally.  Oropharynx moist, clear, without exudate, or postoropharyngeal swelling. Neck: Supple, thyroid normal,no cervical adenopathy  Respiratory: Respiratory effort normal, Breath sounds clear A&P without rhonchi, wheeze, or rale.  No retractions, no accessory usage. Cardio: RRR with no MRGs. Brisk peripheral  pulses without edema.  Abdomen: Soft, + BS,  Non tender, no guarding, rebound, hernias, masses. Musculoskeletal: Full ROM, 5/5 strength, Normal gait Skin: Warm, dry without rashes, lesions, ecchymosis.  Neuro: Awake and oriented X 3, Cranial nerves intact. Normal muscle tone, no cerebellar symptoms. Psych: Normal affect, Insight and Judgment appropriate.    Starlyn Skeans, PA-C 10:44 AM [facility]

## 2016-06-14 ENCOUNTER — Encounter: Payer: Self-pay | Admitting: Internal Medicine

## 2016-06-15 ENCOUNTER — Other Ambulatory Visit: Payer: Self-pay | Admitting: Internal Medicine

## 2016-06-18 ENCOUNTER — Ambulatory Visit
Admission: RE | Admit: 2016-06-18 | Discharge: 2016-06-18 | Disposition: A | Discharge status: Home / Self Care | Payer: Medicare Other | Source: Ambulatory Visit | Attending: Internal Medicine | Admitting: Internal Medicine

## 2016-08-28 ENCOUNTER — Other Ambulatory Visit: Payer: Self-pay | Admitting: Internal Medicine

## 2016-09-11 ENCOUNTER — Other Ambulatory Visit: Payer: Self-pay | Admitting: Internal Medicine

## 2016-09-11 DIAGNOSIS — R1310 Dysphagia, unspecified: Secondary | ICD-10-CM

## 2016-09-11 DIAGNOSIS — R1319 Other dysphagia: Secondary | ICD-10-CM

## 2016-09-12 ENCOUNTER — Ambulatory Visit
Admission: RE | Admit: 2016-09-12 | Discharge: 2016-09-12 | Disposition: A | Discharge status: Home / Self Care | Payer: Medicare Other | Source: Ambulatory Visit | Attending: Internal Medicine | Admitting: Internal Medicine

## 2016-09-12 DIAGNOSIS — R1310 Dysphagia, unspecified: Secondary | ICD-10-CM

## 2016-09-12 DIAGNOSIS — R1319 Other dysphagia: Secondary | ICD-10-CM

## 2016-09-16 ENCOUNTER — Ambulatory Visit (INDEPENDENT_AMBULATORY_CARE_PROVIDER_SITE_OTHER): Payer: Medicare Other | Admitting: Internal Medicine

## 2016-09-16 ENCOUNTER — Encounter: Payer: Self-pay | Admitting: Internal Medicine

## 2016-09-16 VITALS — BP 126/68 | HR 64 | Temp 97.2°F | Resp 16 | Ht 69.5 in | Wt 204.0 lb

## 2016-09-16 DIAGNOSIS — E782 Mixed hyperlipidemia: Secondary | ICD-10-CM | POA: Diagnosis not present

## 2016-09-16 DIAGNOSIS — E559 Vitamin D deficiency, unspecified: Secondary | ICD-10-CM | POA: Diagnosis not present

## 2016-09-16 DIAGNOSIS — I2510 Atherosclerotic heart disease of native coronary artery without angina pectoris: Secondary | ICD-10-CM

## 2016-09-16 DIAGNOSIS — I480 Paroxysmal atrial fibrillation: Secondary | ICD-10-CM | POA: Diagnosis not present

## 2016-09-16 DIAGNOSIS — R7303 Prediabetes: Secondary | ICD-10-CM

## 2016-09-16 DIAGNOSIS — I10 Essential (primary) hypertension: Secondary | ICD-10-CM

## 2016-09-16 DIAGNOSIS — Z79899 Other long term (current) drug therapy: Secondary | ICD-10-CM

## 2016-09-16 LAB — HEPATIC FUNCTION PANEL
ALT: 20 U/L (ref 9–46)
AST: 19 U/L (ref 10–35)
Albumin: 4.1 g/dL (ref 3.6–5.1)
Alkaline Phosphatase: 51 U/L (ref 40–115)
BILIRUBIN INDIRECT: 1.1 mg/dL (ref 0.2–1.2)
Bilirubin, Direct: 0.3 mg/dL — ABNORMAL HIGH (ref <=0.2)
TOTAL PROTEIN: 6.5 g/dL (ref 6.1–8.1)
Total Bilirubin: 1.4 mg/dL — ABNORMAL HIGH (ref 0.2–1.2)

## 2016-09-16 LAB — BASIC METABOLIC PANEL WITH GFR
BUN: 10 mg/dL (ref 7–25)
CALCIUM: 8.9 mg/dL (ref 8.6–10.3)
CO2: 24 mmol/L (ref 20–31)
Chloride: 109 mmol/L (ref 98–110)
Creat: 0.9 mg/dL (ref 0.70–1.18)
GFR, EST NON AFRICAN AMERICAN: 86 mL/min (ref >=60)
GFR, Est African American: >89 mL/min (ref >=60)
GLUCOSE: 94 mg/dL (ref 65–99)
Potassium: 4 mmol/L (ref 3.5–5.3)
Sodium: 142 mmol/L (ref 135–146)

## 2016-09-16 LAB — TSH: TSH: 0.79 mIU/L (ref 0.40–4.50)

## 2016-09-16 LAB — CBC WITH DIFFERENTIAL/PLATELET
BASOS ABS: 0 {cells}/uL (ref 0–200)
Basophils Relative: 0 %
EOS ABS: 177 {cells}/uL (ref 15–500)
Eosinophils Relative: 3 %
HEMATOCRIT: 45.1 % (ref 38.5–50.0)
HEMOGLOBIN: 15.1 g/dL (ref 13.2–17.1)
LYMPHS ABS: 1416 {cells}/uL (ref 850–3900)
Lymphocytes Relative: 24 %
MCH: 29.4 pg (ref 27.0–33.0)
MCHC: 33.5 g/dL (ref 32.0–36.0)
MCV: 87.7 fL (ref 80.0–100.0)
MONO ABS: 472 {cells}/uL (ref 200–950)
MPV: 9.5 fL (ref 7.5–12.5)
Monocytes Relative: 8 %
NEUTROS PCT: 65 %
Neutro Abs: 3835 cells/uL (ref 1500–7800)
Platelets: 193 10*3/uL (ref 140–400)
RBC: 5.14 MIL/uL (ref 4.20–5.80)
RDW: 13.6 % (ref 11.0–15.0)
WBC: 5.9 10*3/uL (ref 3.8–10.8)

## 2016-09-16 LAB — LIPID PANEL
CHOLESTEROL: 127 mg/dL (ref <200)
HDL: 63 mg/dL (ref >40)
LDL CALC: 54 mg/dL (ref <100)
Total CHOL/HDL Ratio: 2 Ratio (ref <5.0)
Triglycerides: 49 mg/dL (ref <150)
VLDL: 10 mg/dL (ref <30)

## 2016-09-16 NOTE — Progress Notes (Signed)
[facility] Damon [name], M.D.        Damon [name]. Damon [name], P.A.-C       Damon [name], P.A.-C  [facility]                [address] [place], [place] [id number] Telephone [phone] Telefax [phone] ______________________________________________________________________     This very nice 71 y.o. MWM presents for 6 month follow up with Hypertension, Hyperlipidemia, Pre-Diabetes and Vitamin D Deficiency.      Patient is treated for HTN (1996) & BP has been controlled at home. Today's BP is at goal - 126/68. Patient has hx/o pAfib and has undergone RFA x 2 (2014 and 2016) and is on Xarelto. In 2015 he underwent PCA/Stenting followed by Rotator Ablation intervention & has done well since that time.  Patient has had no complaints of any cardiac type chest pain, palpitations, dyspnea/orthopnea/PND, dizziness, claudication, or dependent edema.     Hyperlipidemia is controlled with diet & meds. Patient denies myalgias or other med SE's. Last Lipids were at goal: Lab Results  Component Value Date   CHOL 162 03/06/2016   HDL 65 03/06/2016   LDLCALC 85 03/06/2016   TRIG 62 03/06/2016   CHOLHDL 2.5 03/06/2016      Also, the patient has history of PreDiabetes (A1c 5.7% in 2011 and 5.9% in 2013) and has had no symptoms of reactive hypoglycemia, diabetic polys, paresthesias or visual blurring.  Last A1c was at goal: Lab Results  Component Value Date   HGBA1C 5.3 03/06/2016      Further, the patient also has history of Vitamin D Deficiency ("27" in 2008) and supplements vitamin D without any suspected side-effects. Last vitamin D was at goal:   Lab Results  Component Value Date   VD25OH 89 03/06/2016   Current Outpatient Prescriptions on File Prior to Visit  Medication Sig  . VITAMIN C 1000 MG  Take 1,000 mg by mouth daily as needed (vitamin supplement).   . bisoprolol-hctz 10-6.25  TAKE ONE TABLET BY  MOUTH ONCE DAILY  . Multi-Vit w/Min Take 1 tablet by mouth daily. FOR MEN  . NITROSTAT 0.4 MG SL D AS NEEDED FOR CHEST PAIN  . rosuvastatin40 MG  TAKE ONE TABLET BY MOUTH ONCE DAILY  . VITAMIN D 5,000 Units Take 1 tablet daily  . XARELTO 20 MG  TAKE ONE TABLET BY MOUTH ONCE DAILY WITH SUPPER   No current facility-administered medications on file prior to visit.    Allergies  Allergen Reactions  . Zetia [Ezetimibe] myalgias   PMHx:   Past Medical History:  Diagnosis Date  . Bladder cancer ([place])   . Hearing loss of both ears   . History of colon polyps    BENIGN  . Hyperlipidemia   . Hypertension   . Mitral regurgitation   . OSA on CPAP    cpap settign of 13  . PAD (peripheral artery disease) (HCC)    ABI--  RIGHT SIDE NORMAL LEFT SIDE MODERATELY REDUCED W/ DISTAL LEFT SFA STENOSIS//  MILD CALDICATION  . PAF (paroxysmal atrial fibrillation) ([place])     a. s/p PVI 2014; b. s/p redo PVI and CTI 01/2015  . Pre-diabetes   . Vitamin D deficiency    Immunization History  Administered Date(s) Administered  . DT 07/22/2003, 12/28/2013  . Influenza Whole 05/19/2013  . Influenza,inj,Quad PF,36+ Mos 05/09/2014  .  Influenza-Unspecified 05/14/2015, 05/01/2016  . Pneumococcal Conjugate-13 07/11/2014  . Pneumococcal Polysaccharide-23 12/04/2009, 06/10/2016  . Tdap 09/22/2013   Past Surgical History:  Procedure Laterality Date  . ABLATION OF DYSRHYTHMIC FOCUS  01/26/2015  . ATRIAL FIBRILLATION ABLATION N/A 07/27/2012   Procedure: ATRIAL FIBRILLATION ABLATION;  Surgeon: Damon Grayer, MD;  Location: [facility] CATH LAB;  Service: Cardiovascular;  Laterality: N/A;  . CARDIAC ELECTROPHYSIOLOGY STUDY AND ABLATION  07-27-2012  DR Damon [name]   SUCCESSFUL ABLATION OF A-FIB  . CARDIOVERSION  08/06/2012   Procedure: CARDIOVERSION;  Surgeon: Damon Records, MD;  Location: [facility] ENDOSCOPY;  Service: Cardiovascular;  Laterality: N/A;  . CARDIOVERSION N/A 08/25/2014   Procedure: CARDIOVERSION;  Surgeon: Damon Dresser,  MD;  Location: [place];  Service: Cardiovascular;  Laterality: N/A;  . CARDIOVERSION N/A 11/13/2014   Procedure: CARDIOVERSION;  Surgeon: Damon Margarita, MD;  Location: [facility] ENDOSCOPY;  Service: Cardiovascular;  Laterality: N/A;  . CARDIOVERSION N/A 12/28/2014   Procedure: CARDIOVERSION;  Surgeon: Damon Records, MD;  Location: [facility] ENDOSCOPY;  Service: Cardiovascular;  Laterality: N/A;  . CATARACT EXTRACTION W/ INTRAOCULAR LENS  IMPLANT, BILATERAL    . COLONOSCOPY WITH PROPOFOL N/A 02/13/2015   Procedure: COLONOSCOPY WITH PROPOFOL;  Surgeon: Damon Fair, MD;  Location: [facility] ENDOSCOPY;  Service: Endoscopy;  Laterality: N/A;  . CYSTOSCOPY W/ RETROGRADES Bilateral 04/12/2013   Procedure: CYSTOSCOPY WITH RETROGRADE PYELOGRAM;  Surgeon: Damon Frock, MD;  Location: [facility];  Service: Urology;  Laterality: Bilateral;  . ELECTROPHYSIOLOGIC STUDY N/A 01/26/2015   Procedure: Atrial Fibrillation Ablation;  Surgeon: Damon Grayer, MD;  Location: [facility] CV LAB;  Service: Cardiovascular;  Laterality: N/A;  . LEFT HEART CATHETERIZATION WITH CORONARY ANGIOGRAM N/A 11/24/2013   Procedure: LEFT HEART CATHETERIZATION WITH CORONARY ANGIOGRAM;  Surgeon: Damon Blanks, MD;  Location: [facility] CATH LAB;  Service: Cardiovascular;  Laterality: N/A;  . PERCUTANEOUS CORONARY ROTOBLATOR INTERVENTION (PCI-R) N/A 11/28/2013   Procedure: PERCUTANEOUS CORONARY ROTOBLATOR INTERVENTION (PCI-R);  Surgeon: Damon Blanks, MD;  Location: [facility] CATH LAB;  Service: Cardiovascular;  Laterality: N/A;  . PERCUTANEOUS CORONARY STENT INTERVENTION (PCI-S)  11/24/2013   Procedure: PERCUTANEOUS CORONARY STENT INTERVENTION (PCI-S);  Surgeon: Damon Blanks, MD;  Location: [facility] CATH LAB;  Service: Cardiovascular;;  . TEE WITHOUT CARDIOVERSION  07/26/2012   Procedure: TRANSESOPHAGEAL ECHOCARDIOGRAM (TEE);  Surgeon: Damon Records, MD;  Location: [facility] ENDOSCOPY;  Service: Cardiovascular;  Laterality: N/A;  . TEE WITHOUT  CARDIOVERSION N/A 08/25/2014   Procedure: TRANSESOPHAGEAL ECHOCARDIOGRAM (TEE);  Surgeon: Damon Dresser, MD;  Location: [place];  Service: Cardiovascular;  Laterality: N/A;  . TEE WITHOUT CARDIOVERSION  01/26/2015   Procedure: Transesophageal Echocardiogram (Tee);  Surgeon: [name], MD;  Location: [place] CV LAB;  Service: Cardiovascular;;  . TONSILLECTOMY  age 76  . TOTAL HIP ARTHROPLASTY  07/25/2011   Procedure: TOTAL HIP ARTHROPLASTY ANTERIOR APPROACH;  Surgeon: Mcarthur Rossetti;  Location: [facility] ORS;  Service: Orthopedics;  Laterality: Right;  . TOTAL HIP ARTHROPLASTY Left 02-08-2010  . TRANSURETHRAL RESECTION OF BLADDER TUMOR WITH GYRUS (TURBT-GYRUS) N/A 04/12/2013   Procedure: TRANSURETHRAL RESECTION OF BLADDER TUMOR WITH GYRUS (TURBT-GYRUS);  Surgeon: Damon Frock, MD;  Location: [facility];  Service: Urology;  Laterality: N/A;   FHx:    Reviewed / unchanged  SHx:    Reviewed / unchanged  Systems Review:  Constitutional: Denies fever, chills, wt changes, headaches, insomnia, fatigue, night sweats, change in appetite. Eyes: Denies redness, blurred vision, diplopia, discharge, itchy, watery eyes.  ENT: Denies discharge, congestion, post nasal drip, epistaxis, sore throat, earache, hearing loss, dental pain, tinnitus, vertigo, sinus pain, snoring.  CV: Denies chest pain, palpitations, irregular heartbeat, syncope, dyspnea, diaphoresis, orthopnea, PND, claudication or edema. Respiratory: denies cough, dyspnea, DOE, pleurisy, hoarseness, laryngitis, wheezing.  Gastrointestinal: Denies dysphagia, odynophagia, heartburn, reflux, water brash, abdominal pain or cramps, nausea, vomiting, bloating, diarrhea, constipation, hematemesis, melena, hematochezia  or hemorrhoids. Genitourinary: Denies dysuria, frequency, urgency, nocturia, hesitancy, discharge, hematuria or flank pain. Musculoskeletal: Denies arthralgias, myalgias, stiffness, jt. swelling, pain, limping or  strain/sprain.  Skin: Denies pruritus, rash, hives, warts, acne, eczema or change in skin lesion(s). Neuro: No weakness, tremor, incoordination, spasms, paresthesia or pain. Psychiatric: Denies confusion, memory loss or sensory loss. Endo: Denies change in weight, skin or hair change.  Heme/Lymph: No excessive bleeding, bruising or enlarged lymph nodes.  Physical Exam  BP 126/68   Pulse 64   Temp 97.2 F (36.2 C)   Resp 16   Ht 5' 9.5" (1.765 m)   Wt 204 lb (92.5 kg)   BMI 29.69 kg/m   Appears well nourished and in no distress.  Eyes: PERRLA, EOMs, conjunctiva no swelling or erythema. Sinuses: No frontal/maxillary tenderness ENT/Mouth: EAC's clear, TM's nl w/o erythema, bulging. Nares clear w/o erythema, swelling, exudates. Oropharynx clear without erythema or exudates. Oral hygiene is good. Tongue normal, non obstructing. Hearing intact.  Neck: Supple. Thyroid nl. Car 2+/2+ without bruits, nodes or JVD. Chest: Respirations nl with BS clear & equal w/o rales, rhonchi, wheezing or stridor.  Cor: Heart sounds normal w/ regular rate and rhythm without sig. murmurs, gallops, clicks, or rubs. Peripheral pulses normal and equal  without edema.  Abdomen: Soft & bowel sounds normal. Non-tender w/o guarding, rebound, hernias, masses, or organomegaly.  Lymphatics: Unremarkable.  Musculoskeletal: Full ROM all peripheral extremities, joint stability, 5/5 strength, and normal gait.  Skin: Warm, dry without exposed rashes, lesions or ecchymosis apparent.  Neuro: Cranial nerves intact, reflexes equal bilaterally. Sensory-motor testing grossly intact. Tendon reflexes grossly intact.  Pysch: Alert & oriented x 3.  Insight and judgement nl & appropriate. No ideations.  Assessment and Plan:  1. Essential hypertension  - Continue medication, monitor blood pressure at home.  - Continue DASH diet. Reminder to go to the ER if any CP,  SOB, nausea, dizziness, severe HA, changes vision/speech,  left  arm numbness and tingling and jaw pain. - CBC with Differential/Platelet - BASIC METABOLIC PANEL WITH GFR - Magnesium - TSH  2. Hyperlipidemia  - Continue diet/meds, exercise,& lifestyle modifications.  - Continue monitor periodic cholesterol/liver & renal functions  - Hepatic function panel - Lipid panel - TSH  3. Prediabetes  - Continue diet, exercise, lifestyle modifications.  - Monitor appropriate labs. - Hemoglobin A1c - Insulin, random  4. Vitamin D deficiency  - Continue supplementation. - VITAMIN D 25 Hydroxy   5. Atherosclerosis of native coronary artery   - Lipid panel  6. Paroxysmal atrial fibrillation (HCC)  - Magnesium - Lipid panel  7. Medication management  - CBC with Differential/Platelet - BASIC METABOLIC PANEL WITH GFR - Hepatic function panel - Magnesium - Lipid panel - TSH - Hemoglobin A1c - Insulin, random - VITAMIN D 25 Hydroxy        Recommended regular exercise, BP monitoring, weight control, and discussed med and SE's. Recommended labs to assess and monitor clinical status. Further disposition pending results of labs. Over 30 minutes of exam, counseling, chart review was performed

## 2016-09-16 NOTE — Patient Instructions (Signed)

## 2016-09-17 LAB — HEMOGLOBIN A1C
HEMOGLOBIN A1C: 5.4 % (ref <5.7)
MEAN PLASMA GLUCOSE: 108 mg/dL

## 2016-09-17 LAB — VITAMIN D 25 HYDROXY (VIT D DEFICIENCY, FRACTURES): VIT D 25 HYDROXY: 67 ng/mL (ref 30–100)

## 2016-09-17 LAB — MAGNESIUM: Magnesium: 2.1 mg/dL (ref 1.5–2.5)

## 2016-09-17 LAB — INSULIN, RANDOM: INSULIN: 5.8 u[IU]/mL (ref 2.0–19.6)

## 2016-11-11 ENCOUNTER — Other Ambulatory Visit: Payer: Self-pay | Admitting: *Deleted

## 2016-11-11 DIAGNOSIS — Z1211 Encounter for screening for malignant neoplasm of colon: Secondary | ICD-10-CM

## 2016-11-11 LAB — POC HEMOCCULT BLD/STL (HOME/3-CARD/SCREEN)
Card #3 Fecal Occult Blood, POC: NEGATIVE
FECAL OCCULT BLD: NEGATIVE
FECAL OCCULT BLD: NEGATIVE

## 2016-12-23 ENCOUNTER — Ambulatory Visit: Payer: Self-pay | Admitting: Internal Medicine

## 2016-12-27 ENCOUNTER — Other Ambulatory Visit: Payer: Self-pay | Admitting: Internal Medicine

## 2017-01-14 NOTE — Progress Notes (Signed)
Patient ID: Damon [name], male   DOB: [birth year], 71 y.o.   MRN: [id number]  MEDICARE ANNUAL WELLNESS VISIT AND OV  Assessment:   Essential hypertension - continue medications, DASH diet, exercise and monitor at home. Call if greater than 130/80.  - CBC with Differential/Platelet - BASIC METABOLIC PANEL WITH GFR - Hepatic function panel - TSH   Persistent atrial fibrillation ([place]) Continue follow up cardio   Hyperlipidemia -continue medications, check lipids, decrease fatty foods, increase activity.  - Lipid panel  Prediabetes Discussed general issues about diabetes pathophysiology and management., Educational material distributed., Suggested low cholesterol diet., Encouraged aerobic exercise., Discussed foot care., Reminded to get yearly retinal exam. - Hemoglobin A1c  Vitamin D deficiency - VITAMIN D 25 Hydroxy (Vit-D Deficiency, Fractures)   Atherosclerosis of native coronary artery of native heart with other form of angina pectoris (HCC) Control blood pressure, cholesterol, glucose, increase exercise.    Malignant neoplasm of urinary bladder, unspecified site [facility]) Continue close monitoring  Sleep apnea Sleep apnea- continue CPAP, weight loss advised.    Medication management - Magnesium   Mitral regurgitation Continue cardio follow up   Peripheral arterial disease (HCC) Control blood pressure, cholesterol, glucose, increase exercise.    Primary osteoarthritis of hip, unspecified laterality  Gastroesophageal reflux disease, esophagitis presence not specified Continue PPI/H2 blocker, diet discussed   Encounter for Medicare annual wellness exam  Future Appointments Date Time Provider [place]  01/15/2017 9:30 AM Vicie Mutters, PA-C [facility] None  04/08/2017 9:00 AM [name], MD [facility] None     Plan:   During the course of the visit the patient was educated and counseled about appropriate screening and preventive services  including:    Pneumococcal vaccine   Influenza vaccine  Td vaccine  Screening electrocardiogram  Bone densitometry screening  Colorectal cancer screening  Diabetes screening  Glaucoma screening  Nutrition counseling   Advanced directives: requested  Subjective:   Damon [name]  presents for Medicare Annual Wellness Visit and 3 month follow up for Hypertension, Hyperlipidemia, Pre-Diabetes and Vitamin D Deficiency.   In Sept 2014 , Patient had TUR of a Bladder cancer by Dr Carrie Mew, follows regularly.   Patient has HTN. Today's BP: 120/70.  Patient has hx/o pAfib/flutter predating back to 2004, he is on xarelto.  In May 2015 he had DES at the prox circ. Patient has had no complaints of any cardiac type chest pain, palpitations, dyspnea/orthopnea/PND, dizziness, claudication, or dependent edema.  Hyperlipidemia is controlled with diet & meds, he is on crestor. Patient denies myalgias or other med SE's. Last Lipids were at goal -   Lab Results  Component Value Date   CHOL 127 09/16/2016   HDL 63 09/16/2016   LDLCALC 54 09/16/2016   TRIG 49 09/16/2016   CHOLHDL 2.0 09/16/2016   Also, the patient has history of PreDiabetes with A1c 5.7% in 2011 and 5.9% in 2013, and then 5.6% x 2 in late 2013.  He's had no symptoms of reactive hypoglycemia, diabetic polys, paresthesias or visual blurring.   Lab Results  Component Value Date   HGBA1C 5.4 09/16/2016   Further, the patient also has history of Vitamin D Deficiency of 27 in 2008 and supplements vitamin D without any suspected side-effects. Lab Results  Component Value Date   VD25OH 67 09/16/2016   BMI is Body mass index is 29.34 kg/m., he is working on diet and exercise. He has OSA and is on a CPAP.  Wt Readings from Last 3 Encounters:  01/15/17 201 lb 9.6 oz (91.4 kg)  09/16/16 204 lb (92.5 kg)  06/10/16 206 lb (93.4 kg)    Names of Other Physician/Practitioners you currently use: 1. [facility] here for primary care 2. Dr Sherley Bounds, eye doctor, last visit 07/2016 3. ? Name of his dentist, dentist, last visit 2014, several years  Patient Care Team: [name], MD as PCP - General (Internal Medicine) Dwan Bolt, MD as Referring Physician (Cardiology) [name], MD as Consulting Physician (Cardiology) [name], MD as Consulting Physician (Urology) Mcarthur Rossetti, MD as Consulting Physician (Orthopedic Surgery) Ponciano Ort, MD as Referring Physician (Urology) Garlan Fair, MD as Consulting Physician (Gastroenterology) [name], MD as Consulting Physician (Cardiology)  Medication Review: Current Outpatient Prescriptions on File Prior to Visit  Medication Sig  . bisoprolol-hydrochlorothiazide (ZIAC) 10-6.25 MG tablet TAKE ONE TABLET BY MOUTH ONCE DAILY  . Multiple Vitamin (MULITIVITAMIN WITH MINERALS) TABS Take 1 tablet by mouth daily. FOR MEN  . NITROSTAT 0.4 MG SL tablet DISSOLVE ONE TABLET UNDER THE TONGUE EVERY 5 MINUTES AS NEEDED FOR CHEST PAIN.  DO NOT EXCEED A TOTAL OF 3 DOSES IN 15 MINUTES  . rosuvastatin (CRESTOR) 40 MG tablet TAKE ONE TABLET BY MOUTH ONCE DAILY  . VITAMIN D, ERGOCALCIFEROL, PO Take 5,000 Units by mouth daily. Take 1 tablet daily  . XARELTO 20 MG TABS tablet TAKE ONE TABLET BY MOUTH ONCE DAILY WITH SUPPER   No current facility-administered medications on file prior to visit.     Current Problems (verified) Patient Active Problem List   Diagnosis Date Noted  . Encounter for general adult medical examination with abnormal findings 03/06/2016  . Encounter for Medicare annual wellness exam 12/04/2015  . Bladder cancer ([place]) 02/16/2015  . GERD  02/16/2015  . Medication management 12/28/2013  . CAD- CFX PTCA 11/24/13 with plans for DES 11/28/13 11/25/2013  . Hyperlipidemia 06/22/2013  . Prediabetes 06/22/2013  . Vitamin D deficiency 06/22/2013  . Peripheral arterial  disease ([place]) 03/22/2013  . Hypertension   . Mitral regurgitation   . Degenerative arthritis of hip 07/25/2011  . Atrial fibrillation ([place]) 07/11/2011  . Sleep apnea 07/11/2011    Screening Tests Immunization History  Administered Date(s) Administered  . DT 07/22/2003, 12/28/2013  . Influenza Whole 05/19/2013  . Influenza,inj,Quad PF,36+ Mos 05/09/2014  . Influenza-Unspecified 05/14/2015, 05/01/2016  . Pneumococcal Conjugate-13 07/11/2014  . Pneumococcal Polysaccharide-23 12/04/2009, 06/10/2016  . Tdap 09/22/2013   TDAP 2015 Influenza 2016 Prevnar 13 2015 Pneumonia 2011  Preventative care: Last colonoscopy: 02/13/2015 as above  Allergies Allergies  Allergen Reactions  . Zetia [Ezetimibe] Other (See Comments)    Other reaction(s): Other (See Comments) myalgias     SURGICAL HISTORY He  has a past surgical history that includes Total hip arthroplasty (07/25/2011); TEE without cardioversion (07/26/2012); Cardioversion (08/06/2012); Total hip arthroplasty (Left, 02-08-2010); Cataract extraction w/ intraocular lens  implant, bilateral; Cardiac electrophysiology study and ablation (07-27-2012  DR ALLRED); Tonsillectomy (age 26); Cystoscopy w/ retrogrades (Bilateral, 04/12/2013); Transurethral resection of bladder tumor with gyrus (turbt-gyrus) (N/A, 04/12/2013); atrial fibrillation ablation (N/A, 07/27/2012); left heart catheterization with coronary angiogram (N/A, 11/24/2013); percutaneous coronary stent intervention (pci-s) (11/24/2013); percutaneous coronary rotoblator intervention (pci-r) (N/A, 11/28/2013); TEE without cardioversion (N/A, 08/25/2014); Cardioversion (N/A, 08/25/2014); Cardioversion (N/A, 11/13/2014); Cardioversion (N/A, 12/28/2014); Cardiac catheterization (N/A, 01/26/2015); TEE without cardioversion (01/26/2015); Ablation of dysrhythmic focus (01/26/2015); and Colonoscopy with propofol (N/A, 02/13/2015). FAMILY HISTORY His family history includes Heart attack in his  mother; Heart disease in  his father and mother; Heart failure in his father. SOCIAL HISTORY He  reports that he quit smoking about 35 years ago. His smoking use included Cigarettes. He has a 20.00 pack-year smoking history. He has never used smokeless tobacco. He reports that he does not drink alcohol or use drugs.  MEDICARE WELLNESS OBJECTIVES: Physical activity:   Cardiac risk factors:   Depression/mood screen:   Depression screen [facility] 2/9 09/16/2016  Decreased Interest 0  Down, Depressed, Hopeless 0  PHQ - 2 Score 0    ADLs:  In your present state of health, do you have any difficulty performing the following activities: 09/16/2016 03/06/2016  Hearing? N N  Vision? N N  Difficulty concentrating or making decisions? N N  Walking or climbing stairs? N N  Dressing or bathing? N N  Doing errands, shopping? N N  Some recent data might be hidden     Cognitive Testing  Alert? Yes  Normal Appearance?Yes  Oriented to person? Yes  Place? Yes   Time? Yes  Recall of three objects?  Yes  Can perform simple calculations? Yes  Displays appropriate judgment?Yes  Can read the correct time from a watch face?Yes  EOL planning: Does Patient Have a Medical Advance Directive?: No    Objective:     Blood pressure 120/70, pulse 67, temperature 97.9 F (36.6 C), resp. rate 16, height 5' 9.5" (1.765 m), weight 201 lb 9.6 oz (91.4 kg), SpO2 95 %.   General Appearance:  Alert  WD/WN, male  in no apparent distress. Eyes: PERRLA, EOMs nl, conjunctiva normal, normal fundi and vessels. Sinuses: No frontal/maxillary tenderness ENT/Mouth: EACs patent / TMs  nl. Nares clear without erythema, swelling, mucoid exudates. Oral hygiene is good. No erythema, swelling, or exudate. Tongue normal, non-obstructing. Tonsils not swollen or erythematous. Hearing normal.  Neck: Supple, thyroid normal. No bruits, nodes or JVD. Respiratory: Respiratory effort normal.  BS equal and clear bilateral without rales, rhonci, wheezing or  stridor. Cardio: Heart sounds are normal with regular rate and rhythm and no murmurs, rubs or gallops. Peripheral pulses are normal and equal bilaterally without edema. No aortic or femoral bruits. Chest: symmetric with normal excursions and percussion.  Abdomen: Flat, soft, with nl bowel sounds. Nontender, no guarding, rebound, hernias, masses, or organomegaly.  Lymphatics: Non tender without lymphadenopathy.  Genitourinary: Deferred for recent Colonoscopy and sees Dr Carrie Mew annually for f/u of Bladder Cancer Musculoskeletal: Full ROM all peripheral extremities, joint stability, 5/5 strength, and normal gait. Skin: Warm and dry without rashes, lesions, cyanosis, clubbing or  ecchymosis.  Neuro: Cranial nerves intact, reflexes equal bilaterally. Normal muscle tone, no cerebellar symptoms. Sensation intact.  Pysch: Alert and oriented X 3 with normal affect, insight and judgment appropriate.    Medicare Attestation I have personally reviewed: The patient's medical and social history Their use of alcohol, tobacco or illicit drugs Their current medications and supplements The patient's functional ability including ADLs,fall risks, home safety risks, cognitive, and hearing and visual impairment Diet and physical activities Evidence for depression or mood disorders  The patient's weight, height, BMI, and visual acuity have been recorded in the chart.  I have made referrals, counseling, and provided education to the patient based on review of the above and I have provided the patient with a written personalized care plan for preventive services.  Over 40 minutes of exam, counseling, chart review was performed.  Vicie Mutters, PA-C   01/15/2017

## 2017-01-15 ENCOUNTER — Ambulatory Visit (INDEPENDENT_AMBULATORY_CARE_PROVIDER_SITE_OTHER): Payer: Medicare Other | Admitting: Physician Assistant

## 2017-01-15 ENCOUNTER — Encounter: Payer: Self-pay | Admitting: Physician Assistant

## 2017-01-15 VITALS — BP 120/70 | HR 67 | Temp 97.9°F | Resp 16 | Ht 69.5 in | Wt 201.6 lb

## 2017-01-15 DIAGNOSIS — G4730 Sleep apnea, unspecified: Secondary | ICD-10-CM | POA: Diagnosis not present

## 2017-01-15 DIAGNOSIS — I10 Essential (primary) hypertension: Secondary | ICD-10-CM

## 2017-01-15 DIAGNOSIS — Z79899 Other long term (current) drug therapy: Secondary | ICD-10-CM

## 2017-01-15 DIAGNOSIS — I739 Peripheral vascular disease, unspecified: Secondary | ICD-10-CM

## 2017-01-15 DIAGNOSIS — Z0001 Encounter for general adult medical examination with abnormal findings: Secondary | ICD-10-CM

## 2017-01-15 DIAGNOSIS — R7303 Prediabetes: Secondary | ICD-10-CM | POA: Diagnosis not present

## 2017-01-15 DIAGNOSIS — K219 Gastro-esophageal reflux disease without esophagitis: Secondary | ICD-10-CM

## 2017-01-15 DIAGNOSIS — I340 Nonrheumatic mitral (valve) insufficiency: Secondary | ICD-10-CM | POA: Diagnosis not present

## 2017-01-15 DIAGNOSIS — R6889 Other general symptoms and signs: Secondary | ICD-10-CM

## 2017-01-15 DIAGNOSIS — Z0000 Encounter for general adult medical examination without abnormal findings: Secondary | ICD-10-CM

## 2017-01-15 DIAGNOSIS — C679 Malignant neoplasm of bladder, unspecified: Secondary | ICD-10-CM

## 2017-01-15 DIAGNOSIS — I2510 Atherosclerotic heart disease of native coronary artery without angina pectoris: Secondary | ICD-10-CM

## 2017-01-15 DIAGNOSIS — E559 Vitamin D deficiency, unspecified: Secondary | ICD-10-CM

## 2017-01-15 DIAGNOSIS — I480 Paroxysmal atrial fibrillation: Secondary | ICD-10-CM

## 2017-01-15 DIAGNOSIS — M1610 Unilateral primary osteoarthritis, unspecified hip: Secondary | ICD-10-CM

## 2017-01-15 DIAGNOSIS — E782 Mixed hyperlipidemia: Secondary | ICD-10-CM

## 2017-01-15 LAB — CBC WITH DIFFERENTIAL/PLATELET
BASOS PCT: 1 %
Basophils Absolute: 71 cells/uL (ref 0–200)
Eosinophils Absolute: 284 cells/uL (ref 15–500)
Eosinophils Relative: 4 %
HEMATOCRIT: 45.2 % (ref 38.5–50.0)
HEMOGLOBIN: 15.3 g/dL (ref 13.2–17.1)
LYMPHS ABS: 1633 {cells}/uL (ref 850–3900)
Lymphocytes Relative: 23 %
MCH: 29.9 pg (ref 27.0–33.0)
MCHC: 33.8 g/dL (ref 32.0–36.0)
MCV: 88.5 fL (ref 80.0–100.0)
MONO ABS: 497 {cells}/uL (ref 200–950)
MPV: 9.4 fL (ref 7.5–12.5)
Monocytes Relative: 7 %
Neutro Abs: 4615 cells/uL (ref 1500–7800)
Neutrophils Relative %: 65 %
Platelets: 254 10*3/uL (ref 140–400)
RBC: 5.11 MIL/uL (ref 4.20–5.80)
RDW: 13.1 % (ref 11.0–15.0)
WBC: 7.1 10*3/uL (ref 3.8–10.8)

## 2017-01-15 LAB — TSH: TSH: 0.87 mIU/L (ref 0.40–4.50)

## 2017-01-15 NOTE — Patient Instructions (Signed)
   Eat 3 meals a day to help with weight loss Measure how much water you are drinking a day, goal is AT LEAST 80 oz    Simple math prevails.    1st - exercise does not produce significant weight loss - at best one converts fat into muscle , "bulks up", loses inches, but usually stays "weight neutral"     2nd - think of your body weightas a check book: If you eat more calories than you burn up - you save money or gain weight .... Or if you spend more money than you put in the check book, ie burn up more calories than you eat, then you lose weight     3rd - if you walk or run 1 mile, you burn up 100 calories - you have to burn up 3,500 calories to lose 1 pound, ie you have to walk/run 35 miles to lose 1 measly pound. So if you want to lose 10 #, then you have to walk/run 350 miles, so.... clearly exercise is not the solution.     4. So if you consume 1,500 calories, then you have to burn up the equivalent of 15 miles to stay weight neutral - It also stands to reason that if you consume 1,500 cal/day and don't lose weight, then you must be burning up about 1,500 cals/day to stay weight neutral.     5. If you really want to lose weight, you must cut your calorie intake 300 calories /day and at that rate you should lose about 1 # every 3 days.   6. Please purchase Dr Fara Olden Fuhrman's book(s) "The End of Dieting" & "Eat to Live" . It has some great concepts and recipes.

## 2017-01-16 LAB — HEPATIC FUNCTION PANEL
ALK PHOS: 62 U/L (ref 40–115)
ALT: 19 U/L (ref 9–46)
AST: 20 U/L (ref 10–35)
Albumin: 4.3 g/dL (ref 3.6–5.1)
BILIRUBIN INDIRECT: 0.7 mg/dL (ref 0.2–1.2)
Bilirubin, Direct: 0.2 mg/dL (ref <=0.2)
TOTAL PROTEIN: 6.9 g/dL (ref 6.1–8.1)
Total Bilirubin: 0.9 mg/dL (ref 0.2–1.2)

## 2017-01-16 LAB — BASIC METABOLIC PANEL WITH GFR
BUN: 23 mg/dL (ref 7–25)
CO2: 24 mmol/L (ref 20–31)
Calcium: 9.5 mg/dL (ref 8.6–10.3)
Chloride: 107 mmol/L (ref 98–110)
Creat: 0.94 mg/dL (ref 0.70–1.18)
GFR, EST NON AFRICAN AMERICAN: 81 mL/min (ref >=60)
GLUCOSE: 94 mg/dL (ref 65–99)
POTASSIUM: 4.3 mmol/L (ref 3.5–5.3)
SODIUM: 142 mmol/L (ref 135–146)

## 2017-01-16 LAB — LIPID PANEL
Cholesterol: 142 mg/dL (ref <200)
HDL: 51 mg/dL (ref >40)
LDL CALC: 72 mg/dL (ref <100)
TRIGLYCERIDES: 94 mg/dL (ref <150)
Total CHOL/HDL Ratio: 2.8 Ratio (ref <5.0)
VLDL: 19 mg/dL (ref <30)

## 2017-01-16 LAB — MAGNESIUM: Magnesium: 2.1 mg/dL (ref 1.5–2.5)

## 2017-01-26 ENCOUNTER — Telehealth: Payer: Self-pay | Admitting: Internal Medicine

## 2017-01-26 NOTE — Telephone Encounter (Signed)
Patient has been in afib since Saturday per patient "does not like being in afib" but otherwise stable. Appointment made for first available on 7/11 @9am . Pt in agreement.

## 2017-01-26 NOTE — Telephone Encounter (Signed)
Mr.Epple is calling because he is in AFIB , Please call

## 2017-01-28 ENCOUNTER — Ambulatory Visit (HOSPITAL_COMMUNITY)
Admission: RE | Admit: 2017-01-28 | Discharge: 2017-01-28 | Disposition: A | Discharge status: Home / Self Care | Payer: Medicare Other | Source: Ambulatory Visit | Attending: Nurse Practitioner | Admitting: Nurse Practitioner

## 2017-01-28 ENCOUNTER — Encounter (HOSPITAL_COMMUNITY): Payer: Self-pay | Admitting: Nurse Practitioner

## 2017-01-28 VITALS — BP 114/64 | HR 118 | Ht 69.5 in | Wt 201.6 lb

## 2017-01-28 DIAGNOSIS — Z87891 Personal history of nicotine dependence: Secondary | ICD-10-CM | POA: Diagnosis not present

## 2017-01-28 DIAGNOSIS — Z8249 Family history of ischemic heart disease and other diseases of the circulatory system: Secondary | ICD-10-CM | POA: Diagnosis not present

## 2017-01-28 DIAGNOSIS — Z96643 Presence of artificial hip joint, bilateral: Secondary | ICD-10-CM | POA: Diagnosis not present

## 2017-01-28 DIAGNOSIS — I739 Peripheral vascular disease, unspecified: Secondary | ICD-10-CM | POA: Diagnosis not present

## 2017-01-28 DIAGNOSIS — Z888 Allergy status to other drugs, medicaments and biological substances status: Secondary | ICD-10-CM | POA: Insufficient documentation

## 2017-01-28 DIAGNOSIS — I4891 Unspecified atrial fibrillation: Secondary | ICD-10-CM | POA: Diagnosis present

## 2017-01-28 DIAGNOSIS — R7303 Prediabetes: Secondary | ICD-10-CM | POA: Insufficient documentation

## 2017-01-28 DIAGNOSIS — Z955 Presence of coronary angioplasty implant and graft: Secondary | ICD-10-CM | POA: Diagnosis not present

## 2017-01-28 DIAGNOSIS — Z7901 Long term (current) use of anticoagulants: Secondary | ICD-10-CM | POA: Diagnosis not present

## 2017-01-28 DIAGNOSIS — I10 Essential (primary) hypertension: Secondary | ICD-10-CM | POA: Insufficient documentation

## 2017-01-28 DIAGNOSIS — E559 Vitamin D deficiency, unspecified: Secondary | ICD-10-CM | POA: Diagnosis not present

## 2017-01-28 DIAGNOSIS — E785 Hyperlipidemia, unspecified: Secondary | ICD-10-CM | POA: Insufficient documentation

## 2017-01-28 DIAGNOSIS — I4819 Other persistent atrial fibrillation: Secondary | ICD-10-CM

## 2017-01-28 DIAGNOSIS — I481 Persistent atrial fibrillation: Secondary | ICD-10-CM | POA: Insufficient documentation

## 2017-01-28 DIAGNOSIS — Z79899 Other long term (current) drug therapy: Secondary | ICD-10-CM | POA: Diagnosis not present

## 2017-01-28 DIAGNOSIS — H9193 Unspecified hearing loss, bilateral: Secondary | ICD-10-CM | POA: Insufficient documentation

## 2017-01-28 DIAGNOSIS — G4733 Obstructive sleep apnea (adult) (pediatric): Secondary | ICD-10-CM | POA: Insufficient documentation

## 2017-01-28 DIAGNOSIS — Z8551 Personal history of malignant neoplasm of bladder: Secondary | ICD-10-CM | POA: Insufficient documentation

## 2017-01-28 LAB — BASIC METABOLIC PANEL
Anion gap: 8 (ref 5–15)
BUN: 21 mg/dL — AB (ref 6–20)
CHLORIDE: 106 mmol/L (ref 101–111)
CO2: 25 mmol/L (ref 22–32)
CREATININE: 1.11 mg/dL (ref 0.61–1.24)
Calcium: 9.9 mg/dL (ref 8.9–10.3)
GFR calc Af Amer: >60 mL/min (ref >60)
GFR calc non Af Amer: >60 mL/min (ref >60)
Glucose, Bld: 115 mg/dL — ABNORMAL HIGH (ref 65–99)
Potassium: 4 mmol/L (ref 3.5–5.1)
SODIUM: 139 mmol/L (ref 135–145)

## 2017-01-28 LAB — CBC
HCT: 51.3 % (ref 39.0–52.0)
HEMOGLOBIN: 17.1 g/dL — AB (ref 13.0–17.0)
MCH: 29.5 pg (ref 26.0–34.0)
MCHC: 33.3 g/dL (ref 30.0–36.0)
MCV: 88.4 fL (ref 78.0–100.0)
PLATELETS: 261 10*3/uL (ref 150–400)
RBC: 5.8 MIL/uL (ref 4.22–5.81)
RDW: 13 % (ref 11.5–15.5)
WBC: 8.6 10*3/uL (ref 4.0–10.5)

## 2017-01-28 NOTE — Progress Notes (Signed)
Primary Care Physician: [name], MD Referring Physician: [name] is a 71 y.o. male with a h/o  afib s/p ablation x 2, last one was 2016 and multiple cardioversion's. He asked to be seen for return of afib since Saturday. He was in [place] and went out to eat and drank two Pepsi's and then had ice cream. As he was eating his ice cream, his heart became irregular. He went to the ER there and IV cardizem was used to slow his heart rate but he left in afib. He is wanting to purse cardioversion. No missed doses of xarelto. This is the first afib since his ablation in 2016.  Today, he denies symptoms of palpitations, chest pain, shortness of breath, orthopnea, PND, lower extremity edema, dizziness, presyncope, syncope, or neurologic sequela. The patient is tolerating medications without difficulties and is otherwise without complaint today.   Past Medical History:  Diagnosis Date  . Bladder cancer ([place])   . Hearing loss of both ears   . History of colon polyps    BENIGN  . Hyperlipidemia   . Hypertension   . Mitral regurgitation   . OSA on CPAP    cpap settign of 13  . PAD (peripheral artery disease) (H[facility])    ABI--  RIGHT SIDE NORMAL LEFT SIDE MODERATELY REDUCED W/ DISTAL LEFT SFA STENOSIS//  MILD CALDICATION  . PAF (paroxysmal atrial fibrillation) ([place])     a. s/p PVI 2014; b. s/p redo PVI and CTI 01/2015  . Pre-diabetes   . Vitamin D deficiency    Past Surgical History:  Procedure Laterality Date  . ABLATION OF DYSRHYTHMIC FOCUS  01/26/2015  . ATRIAL FIBRILLATION ABLATION N/A 07/27/2012   Procedure: ATRIAL FIBRILLATION ABLATION;  Surgeon: [name], MD;  Location: [facility] CATH LAB;  Service: Cardiovascular;  Laterality: N/A;  . CARDIAC ELECTROPHYSIOLOGY STUDY AND ABLATION  07-27-2012  DR ALLRED   SUCCESSFUL ABLATION OF A-FIB  . CARDIOVERSION  08/06/2012   Procedure: CARDIOVERSION;  Surgeon: [name], MD;  Location: [facility] ENDOSCOPY;  Service: Cardiovascular;   Laterality: N/A;  . CARDIOVERSION N/A 08/25/2014   Procedure: CARDIOVERSION;  Surgeon: Larey Dresser, MD;  Location: [place];  Service: Cardiovascular;  Laterality: N/A;  . CARDIOVERSION N/A 11/13/2014   Procedure: CARDIOVERSION;  Surgeon: Sueanne Margarita, MD;  Location: [facility] ENDOSCOPY;  Service: Cardiovascular;  Laterality: N/A;  . CARDIOVERSION N/A 12/28/2014   Procedure: CARDIOVERSION;  Surgeon: [name], MD;  Location: [facility] ENDOSCOPY;  Service: Cardiovascular;  Laterality: N/A;  . CATARACT EXTRACTION W/ INTRAOCULAR LENS  IMPLANT, BILATERAL    . COLONOSCOPY WITH PROPOFOL N/A 02/13/2015   Procedure: COLONOSCOPY WITH PROPOFOL;  Surgeon: Garlan Fair, MD;  Location: [facility] ENDOSCOPY;  Service: Endoscopy;  Laterality: N/A;  . CYSTOSCOPY W/ RETROGRADES Bilateral 04/12/2013   Procedure: CYSTOSCOPY WITH RETROGRADE PYELOGRAM;  Surgeon: [name], MD;  Location: [facility];  Service: Urology;  Laterality: Bilateral;  . ELECTROPHYSIOLOGIC STUDY N/A 01/26/2015   Procedure: Atrial Fibrillation Ablation;  Surgeon: [name], MD;  Location: [place] CV LAB;  Service: Cardiovascular;  Laterality: N/A;  . LEFT HEART CATHETERIZATION WITH CORONARY ANGIOGRAM N/A 11/24/2013   Procedure: LEFT HEART CATHETERIZATION WITH CORONARY ANGIOGRAM;  Surgeon: [name], MD;  Location: [facility] CATH LAB;  Service: Cardiovascular;  Laterality: N/A;  . PERCUTANEOUS CORONARY ROTOBLATOR INTERVENTION (PCI-R) N/A 11/28/2013   Procedure: PERCUTANEOUS CORONARY ROTOBLATOR INTERVENTION (PCI-R);  Surgeon: [name], MD;  Location: [facility] CATH  LAB;  Service: Cardiovascular;  Laterality: N/A;  . PERCUTANEOUS CORONARY STENT INTERVENTION (PCI-S)  11/24/2013   Procedure: PERCUTANEOUS CORONARY STENT INTERVENTION (PCI-S);  Surgeon: [name], MD;  Location: [facility] CATH LAB;  Service: Cardiovascular;;  . TEE WITHOUT CARDIOVERSION  07/26/2012   Procedure: TRANSESOPHAGEAL ECHOCARDIOGRAM (TEE);  Surgeon:  [name], MD;  Location: [facility] ENDOSCOPY;  Service: Cardiovascular;  Laterality: N/A;  . TEE WITHOUT CARDIOVERSION N/A 08/25/2014   Procedure: TRANSESOPHAGEAL ECHOCARDIOGRAM (TEE);  Surgeon: Larey Dresser, MD;  Location: [place];  Service: Cardiovascular;  Laterality: N/A;  . TEE WITHOUT CARDIOVERSION  01/26/2015   Procedure: Transesophageal Echocardiogram (Tee);  Surgeon: [name], MD;  Location: [place] CV LAB;  Service: Cardiovascular;;  . TONSILLECTOMY  age 23  . TOTAL HIP ARTHROPLASTY  07/25/2011   Procedure: TOTAL HIP ARTHROPLASTY ANTERIOR APPROACH;  Surgeon: Mcarthur Rossetti;  Location: [facility] ORS;  Service: Orthopedics;  Laterality: Right;  . TOTAL HIP ARTHROPLASTY Left 02-08-2010  . TRANSURETHRAL RESECTION OF BLADDER TUMOR WITH GYRUS (TURBT-GYRUS) N/A 04/12/2013   Procedure: TRANSURETHRAL RESECTION OF BLADDER TUMOR WITH GYRUS (TURBT-GYRUS);  Surgeon: [name], MD;  Location: [facility];  Service: Urology;  Laterality: N/A;    Current Outpatient Prescriptions  Medication Sig Dispense Refill  . bisoprolol-hydrochlorothiazide (ZIAC) 10-6.25 MG tablet TAKE ONE TABLET BY MOUTH ONCE DAILY 90 tablet 1  . CARTIA XT 180 MG 24 hr capsule 1 capsule daily.    . Multiple Vitamin (MULITIVITAMIN WITH MINERALS) TABS Take 1 tablet by mouth daily. FOR MEN    . NITROSTAT 0.4 MG SL tablet DISSOLVE ONE TABLET UNDER THE TONGUE EVERY 5 MINUTES AS NEEDED FOR CHEST PAIN.  DO NOT EXCEED A TOTAL OF 3 DOSES IN 15 MINUTES 25 tablet 6  . rosuvastatin (CRESTOR) 40 MG tablet TAKE ONE TABLET BY MOUTH ONCE DAILY 90 tablet 1  . VITAMIN D, ERGOCALCIFEROL, PO Take 5,000 Units by mouth daily. Take 1 tablet daily    . XARELTO 20 MG TABS tablet TAKE ONE TABLET BY MOUTH ONCE DAILY WITH SUPPER 90 tablet 2   No current facility-administered medications for this encounter.     Allergies  Allergen Reactions  . Zetia [Ezetimibe] Other (See Comments)    Other reaction(s): Other (See  Comments) myalgias     Social History   Social History  . Marital status: Married    Spouse name: N/A  . Number of children: N/A  . Years of education: N/A   Occupational History  . Not on file.   Social History Main Topics  . Smoking status: Former Smoker    Packs/day: 2.00    Years: 10.00    Types: Cigarettes    Quit date: 09/20/1981  . Smokeless tobacco: Never Used  . Alcohol use No     Comment: pt states he has stopped drinking alcohol  . Drug use: No  . Sexual activity: Not on file   Other Topics Concern  . Not on file   Social History Narrative  . No narrative on file    Family History  Problem Relation Age of Onset  . Heart attack Mother   . Heart disease Mother   . Heart failure Father   . Heart disease Father     ROS- All systems are reviewed and negative except as per the HPI above  Physical Exam: Vitals:   01/28/17 0850  BP: 114/64  Pulse: (!) 118  Weight: 201 lb 9.6 oz (91.4 kg)  Height: 5'  9.5" (1.765 m)   Wt Readings from Last 3 Encounters:  01/28/17 201 lb 9.6 oz (91.4 kg)  01/15/17 201 lb 9.6 oz (91.4 kg)  09/16/16 204 lb (92.5 kg)    Labs: Lab Results  Component Value Date   NA 142 01/15/2017   K 4.3 01/15/2017   CL 107 01/15/2017   CO2 24 01/15/2017   GLUCOSE 94 01/15/2017   BUN 23 01/15/2017   CREATININE 0.94 01/15/2017   CALCIUM 9.5 01/15/2017   MG 2.1 01/15/2017   Lab Results  Component Value Date   INR 1.1 (H) 11/16/2013   Lab Results  Component Value Date   CHOL 142 01/15/2017   HDL 51 01/15/2017   LDLCALC 72 01/15/2017   TRIG 94 01/15/2017     GEN- The patient is well appearing, alert and oriented x 3 today.   Head- normocephalic, atraumatic Eyes-  Sclera clear, conjunctiva pink Ears- hearing intact Oropharynx- clear Neck- supple, no JVP Lymph- no cervical lymphadenopathy Lungs- Clear to ausculation bilaterally, normal work of breathing Heart- irregular rate and rhythm, no murmurs, rubs or gallops, PMI  not laterally displaced GI- soft, NT, ND, + BS Extremities- no clubbing, cyanosis, or edema MS- no significant deformity or atrophy Skin- no rash or lesion Psych- euthymic mood, full affect Neuro- strength and sensation are intact  EKG- afib at 118 bpm, qrs int 90 ms, qtc 496 ms Epic records reviewed    Assessment and Plan: 1. Persistent afib Afib since Saturday, first onset since ablation in 2016 No missed doses of xarelto Will schedule for cardioversion Bmet/cbc  today  F/u in afib clinic in one week s/p cardioversion  Butch Penny C. Jacie Tristan, [facility] [address] [place],  [address] [phone]

## 2017-01-28 NOTE — Patient Instructions (Signed)
Cardioversion scheduled for Friday, July 13th  - Arrive at the [facility] and go to admitting at 11:30AM  -Do not eat or drink anything after midnight the night prior to your procedure.  - Take all your medication with a sip of water prior to arrival.  - You will not be able to drive home after your procedure.

## 2017-01-30 ENCOUNTER — Encounter (HOSPITAL_COMMUNITY): Payer: Self-pay | Admitting: *Deleted

## 2017-01-30 ENCOUNTER — Ambulatory Visit (HOSPITAL_COMMUNITY): Payer: Medicare Other | Admitting: Certified Registered"

## 2017-01-30 ENCOUNTER — Ambulatory Visit (HOSPITAL_COMMUNITY)
Admission: RE | Admit: 2017-01-30 | Discharge: 2017-01-30 | Disposition: A | Discharge status: Home / Self Care | Payer: Medicare Other | Source: Ambulatory Visit | Attending: Cardiovascular Disease | Admitting: Cardiovascular Disease

## 2017-01-30 ENCOUNTER — Encounter (HOSPITAL_COMMUNITY)
Admission: RE | Disposition: A | Discharge status: Home / Self Care | Payer: Self-pay | Source: Ambulatory Visit | Attending: Cardiovascular Disease

## 2017-01-30 DIAGNOSIS — Z8249 Family history of ischemic heart disease and other diseases of the circulatory system: Secondary | ICD-10-CM | POA: Diagnosis not present

## 2017-01-30 DIAGNOSIS — I739 Peripheral vascular disease, unspecified: Secondary | ICD-10-CM | POA: Insufficient documentation

## 2017-01-30 DIAGNOSIS — I4891 Unspecified atrial fibrillation: Secondary | ICD-10-CM | POA: Diagnosis not present

## 2017-01-30 DIAGNOSIS — Z7901 Long term (current) use of anticoagulants: Secondary | ICD-10-CM | POA: Diagnosis not present

## 2017-01-30 DIAGNOSIS — M1990 Unspecified osteoarthritis, unspecified site: Secondary | ICD-10-CM | POA: Diagnosis not present

## 2017-01-30 DIAGNOSIS — E559 Vitamin D deficiency, unspecified: Secondary | ICD-10-CM | POA: Diagnosis not present

## 2017-01-30 DIAGNOSIS — Z87891 Personal history of nicotine dependence: Secondary | ICD-10-CM | POA: Insufficient documentation

## 2017-01-30 DIAGNOSIS — R7303 Prediabetes: Secondary | ICD-10-CM | POA: Insufficient documentation

## 2017-01-30 DIAGNOSIS — Z955 Presence of coronary angioplasty implant and graft: Secondary | ICD-10-CM | POA: Insufficient documentation

## 2017-01-30 DIAGNOSIS — E785 Hyperlipidemia, unspecified: Secondary | ICD-10-CM | POA: Insufficient documentation

## 2017-01-30 DIAGNOSIS — I340 Nonrheumatic mitral (valve) insufficiency: Secondary | ICD-10-CM | POA: Insufficient documentation

## 2017-01-30 DIAGNOSIS — G4733 Obstructive sleep apnea (adult) (pediatric): Secondary | ICD-10-CM | POA: Diagnosis not present

## 2017-01-30 DIAGNOSIS — I481 Persistent atrial fibrillation: Secondary | ICD-10-CM | POA: Insufficient documentation

## 2017-01-30 DIAGNOSIS — I2510 Atherosclerotic heart disease of native coronary artery without angina pectoris: Secondary | ICD-10-CM | POA: Insufficient documentation

## 2017-01-30 DIAGNOSIS — I10 Essential (primary) hypertension: Secondary | ICD-10-CM | POA: Diagnosis not present

## 2017-01-30 HISTORY — PX: CARDIOVERSION: SHX1299

## 2017-01-30 SURGERY — CARDIOVERSION
Anesthesia: General

## 2017-01-30 MED ORDER — PROPOFOL 10 MG/ML IV BOLUS
INTRAVENOUS | Status: AC
  Filled 2017-01-30: qty 20

## 2017-01-30 MED ORDER — LIDOCAINE 2% (20 MG/ML) 5 ML SYRINGE
INTRAMUSCULAR | Status: DC | PRN
  Administered 2017-01-30: 100 mg via INTRAVENOUS

## 2017-01-30 MED ORDER — PROPOFOL 10 MG/ML IV BOLUS
INTRAVENOUS | Status: DC | PRN
  Administered 2017-01-30: 100 mg via INTRAVENOUS

## 2017-01-30 MED ORDER — SODIUM CHLORIDE 0.9 % IV SOLN
INTRAVENOUS | Status: DC | PRN
  Administered 2017-01-30: 13:00:00 via INTRAVENOUS

## 2017-01-30 NOTE — Interval H&P Note (Signed)
History and Physical Interval Note:  01/30/2017 12:05 PM  Damon [name]  has presented today for surgery, with the diagnosis of AFIB  The various methods of treatment have been discussed with the patient and family. After consideration of risks, benefits and other options for treatment, the patient has consented to  Procedure(s): CARDIOVERSION (N/A) as a surgical intervention .  The patient's history has been reviewed, patient examined, no change in status, stable for surgery.  I have reviewed the patient's chart and labs.  Questions were answered to the patient's satisfaction.     Jenkins Rouge

## 2017-01-30 NOTE — H&P (View-Only) (Signed)
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 He asked to be seen for return of afib since Saturday. He was in [place] and went out to eat and drank two Pepsi's and then had ice cream. As he was eating his ice cream, his heart became irregular. He went to the ER there and IV cardizem was used to slow his heart rate but he left in afib. He is wanting to purse cardioversion. No missed doses of xarelto. This is the first afib since his ablation in 2016.  Today, he denies symptoms of palpitations, chest pain, shortness of breath, orthopnea, PND, lower extremity edema, dizziness, presyncope, syncope, or neurologic sequela. The patient is tolerating medications without difficulties and is otherwise without complaint today.   Past Medical History:  Diagnosis Date  . Bladder cancer ([place])   . Hearing loss of both ears   . History of colon polyps    BENIGN  . Hyperlipidemia   . Hypertension   . Mitral regurgitation   . OSA on CPAP    cpap settign of 13  . PAD (peripheral artery disease) ([facility])    ABI--  RIGHT SIDE NORMAL LEFT SIDE MODERATELY REDUCED W/ DISTAL LEFT SFA STENOSIS//  MILD CALDICATION  . PAF (paroxysmal atrial fibrillation) ([place])     a. s/p PVI 2014; b. s/p redo PVI and CTI 01/2015  . Pre-diabetes   . Vitamin D deficiency    Past Surgical History:  Procedure Laterality Date  . ABLATION OF DYSRHYTHMIC FOCUS  01/26/2015  . ATRIAL FIBRILLATION ABLATION N/A 07/27/2012   Procedure: ATRIAL FIBRILLATION ABLATION;  Surgeon: [name], MD;  Location: [facility] CATH LAB;  Service: Cardiovascular;  Laterality: N/A;  . CARDIAC ELECTROPHYSIOLOGY STUDY AND ABLATION  07-27-2012  DR ALLRED   SUCCESSFUL ABLATION OF A-FIB  . CARDIOVERSION  08/06/2012   Procedure: CARDIOVERSION;  Surgeon: [name], MD;  Location: [facility] ENDOSCOPY;  Service: Cardiovascular;   Laterality: N/A;  . CARDIOVERSION N/A 08/25/2014   Procedure: CARDIOVERSION;  Surgeon: Larey Dresser, MD;  Location: [place];  Service: Cardiovascular;  Laterality: N/A;  . CARDIOVERSION N/A 11/13/2014   Procedure: CARDIOVERSION;  Surgeon: Sueanne Margarita, MD;  Location: [facility] ENDOSCOPY;  Service: Cardiovascular;  Laterality: N/A;  . CARDIOVERSION N/A 12/28/2014   Procedure: CARDIOVERSION;  Surgeon: [name], MD;  Location: [facility] ENDOSCOPY;  Service: Cardiovascular;  Laterality: N/A;  . CATARACT EXTRACTION W/ INTRAOCULAR LENS  IMPLANT, BILATERAL    . COLONOSCOPY WITH PROPOFOL N/A 02/13/2015   Procedure: COLONOSCOPY WITH PROPOFOL;  Surgeon: Garlan Fair, MD;  [facility] ENDOSCOPY;  Service: Endoscopy;  Laterality: N/A;  . CYSTOSCOPY W/ RETROGRADES Bilateral 04/12/2013   Procedure: CYSTOSCOPY WITH RETROGRADE PYELOGRAM;  Surgeon: [name], MD;  Location: [facility];  Service: Urology;  Laterality: Bilateral;  . ELECTROPHYSIOLOGIC STUDY N/A 01/26/2015   Procedure: Atrial Fibrillation Ablation;  Surgeon: [name], MD;  Location: [place] CV LAB;  Service: Cardiovascular;  Laterality: N/A;  . LEFT HEART CATHETERIZATION WITH CORONARY ANGIOGRAM N/A 11/24/2013   Procedure: LEFT HEART CATHETERIZATION WITH CORONARY ANGIOGRAM;  Surgeon: [name], MD;  Location: [facility] CATH LAB;  Service: Cardiovascular;  Laterality: N/A;  . PERCUTANEOUS CORONARY ROTOBLATOR INTERVENTION (PCI-R) N/A 11/28/2013   Procedure: PERCUTANEOUS CORONARY ROTOBLATOR INTERVENTION (PCI-R);  Surgeon: [name], MD;  Location: [facility] CATH  LAB;  Service: Cardiovascular;  Laterality: N/A;  . PERCUTANEOUS CORONARY STENT INTERVENTION (PCI-S)  11/24/2013   Procedure: PERCUTANEOUS CORONARY STENT INTERVENTION (PCI-S);  Surgeon: [name], MD;  Location: [facility] CATH LAB;  Service: Cardiovascular;;  . TEE WITHOUT CARDIOVERSION  07/26/2012   Procedure: TRANSESOPHAGEAL ECHOCARDIOGRAM (TEE);  Surgeon:  [name], MD;  Location: [facility] ENDOSCOPY;  Service: Cardiovascular;  Laterality: N/A;  . TEE WITHOUT CARDIOVERSION N/A 08/25/2014   Procedure: TRANSESOPHAGEAL ECHOCARDIOGRAM (TEE);  Surgeon: Larey Dresser, MD;  Location: [place];  Service: Cardiovascular;  Laterality: N/A;  . TEE WITHOUT CARDIOVERSION  01/26/2015   Procedure: Transesophageal Echocardiogram (Tee);  Surgeon: [name], MD;  Location: [place] CV LAB;  Service: Cardiovascular;;  . TONSILLECTOMY  age 89  . TOTAL HIP ARTHROPLASTY  07/25/2011   Procedure: TOTAL HIP ARTHROPLASTY ANTERIOR APPROACH;  Surgeon: Mcarthur Rossetti;  Location: [facility] ORS;  Service: Orthopedics;  Laterality: Right;  . TOTAL HIP ARTHROPLASTY Left 02-08-2010  . TRANSURETHRAL RESECTION OF BLADDER TUMOR WITH GYRUS (TURBT-GYRUS) N/A 04/12/2013   Procedure: TRANSURETHRAL RESECTION OF BLADDER TUMOR WITH GYRUS (TURBT-GYRUS);  Surgeon: [name], MD;  Location: [facility];  Service: Urology;  Laterality: N/A;    Current Outpatient Prescriptions  Medication Sig Dispense Refill  . bisoprolol-hydrochlorothiazide (ZIAC) 10-6.25 MG tablet TAKE ONE TABLET BY MOUTH ONCE DAILY 90 tablet 1  . CARTIA XT 180 MG 24 hr capsule 1 capsule daily.    . Multiple Vitamin (MULITIVITAMIN WITH MINERALS) TABS Take 1 tablet by mouth daily. FOR MEN    . NITROSTAT 0.4 MG SL tablet DISSOLVE ONE TABLET UNDER THE TONGUE EVERY 5 MINUTES AS NEEDED FOR CHEST PAIN.  DO NOT EXCEED A TOTAL OF 3 DOSES IN 15 MINUTES 25 tablet 6  . rosuvastatin (CRESTOR) 40 MG tablet TAKE ONE TABLET BY MOUTH ONCE DAILY 90 tablet 1  . VITAMIN D, ERGOCALCIFEROL, PO Take 5,000 Units by mouth daily. Take 1 tablet daily    . XARELTO 20 MG TABS tablet TAKE ONE TABLET BY MOUTH ONCE DAILY WITH SUPPER 90 tablet 2   No current facility-administered medications for this encounter.     Allergies  Allergen Reactions  . Zetia [Ezetimibe] Other (See Comments)    Other reaction(s): Other (See  Comments) myalgias     Social History   Social History  . Marital status: Married    Spouse name: N/A  . Number of children: N/A  . Years of education: N/A   Occupational History  . Not on file.   Social History Main Topics  . Smoking status: Former Smoker    Packs/day: 2.00    Years: 10.00    Types: Cigarettes    Quit date: 09/20/1981  . Smokeless tobacco: Never Used  . Alcohol use No     Comment: pt states he has stopped drinking alcohol  . Drug use: No  . Sexual activity: Not on file   Other Topics Concern  . Not on file   Social History Narrative  . No narrative on file    Family History  Problem Relation Age of Onset  . Heart attack Mother   . Heart disease Mother   . Heart failure Father   . Heart disease Father     ROS- All systems are reviewed and negative except as per the HPI above  Physical Exam: Vitals:   01/28/17 0850  BP: 114/64  Pulse: (!) 118  Weight: 201 lb 9.6 oz (91.4 kg)  Height: 5'  9.5" (1.765 m)   Wt Readings from Last 3 Encounters:  01/28/17 201 lb 9.6 oz (91.4 kg)  01/15/17 201 lb 9.6 oz (91.4 kg)  09/16/16 204 lb (92.5 kg)    Labs: Lab Results  Component Value Date   NA 142 01/15/2017   K 4.3 01/15/2017   CL 107 01/15/2017   CO2 24 01/15/2017   GLUCOSE 94 01/15/2017   BUN 23 01/15/2017   CREATININE 0.94 01/15/2017   CALCIUM 9.5 01/15/2017   MG 2.1 01/15/2017   Lab Results  Component Value Date   INR 1.1 (H) 11/16/2013   Lab Results  Component Value Date   CHOL 142 01/15/2017   HDL 51 01/15/2017   LDLCALC 72 01/15/2017   TRIG 94 01/15/2017     GEN- The patient is well appearing, alert and oriented x 3 today.   Head- normocephalic, atraumatic Eyes-  Sclera clear, conjunctiva pink Ears- hearing intact Oropharynx- clear Neck- supple, no JVP Lymph- no cervical lymphadenopathy Lungs- Clear to ausculation bilaterally, normal work of breathing Heart- irregular rate and rhythm, no murmurs, rubs or gallops, PMI  not laterally displaced GI- soft, NT, ND, + BS Extremities- no clubbing, cyanosis, or edema MS- no significant deformity or atrophy Skin- no rash or lesion Psych- euthymic mood, full affect Neuro- strength and sensation are intact  EKG- afib at 118 bpm, qrs int 90 ms, qtc 496 ms Epic records reviewed    Assessment and Plan: 1. Persistent afib Afib since Saturday, first onset since ablation in 2016 No missed doses of xarelto Will schedule for cardioversion Bmet/cbc  today  F/u in afib clinic in one week s/p cardioversion  Butch Penny C. Vung Kush, [facility] [address] [place], [place] [address] [phone]

## 2017-01-30 NOTE — CV Procedure (Signed)
[facility] Anesthesia Propofol  DCC x 2 120 J -> 150 J  Converted from afib rate 78 to NSR rate 64 On Rx DOAC with no missed doses  Jenkins Rouge

## 2017-01-30 NOTE — Transfer of Care (Signed)
Immediate Anesthesia Transfer of Care Note  Patient: Damon [name]  Procedure(s) Performed: Procedure(s): CARDIOVERSION (N/A)  Patient Location: Endoscopy Unit  Anesthesia Type:MAC  Level of Consciousness: lethargic and responds to stimulation  Airway & Oxygen Therapy: Patient Spontanous Breathing  Post-op Assessment: Report given to RN  Post vital signs: Reviewed and stable  Last Vitals:  Vitals:   01/30/17 1137  BP: 113/69  Resp: 13  Temp: 36.9 C    Last Pain:  Vitals:   01/30/17 1137  TempSrc: Oral         Complications: No apparent anesthesia complications

## 2017-01-30 NOTE — Anesthesia Preprocedure Evaluation (Addendum)
Anesthesia Evaluation  Patient identified by MRN, date of birth, ID band Patient awake    Reviewed: Allergy & Precautions, NPO status , Patient's Chart, lab work & pertinent test results  History of Anesthesia Complications Negative for: history of anesthetic complications  Airway Mallampati: II  TM Distance: >3 FB Neck ROM: Full    Dental  (+) Dental Advisory Given   Pulmonary sleep apnea and Continuous Positive Airway Pressure Ventilation , former smoker,    breath sounds clear to auscultation       Cardiovascular hypertension, Pt. on medications and Pt. on home beta blockers (-) angina+ CAD, + Cardiac Stents and + Peripheral Vascular Disease  + dysrhythmias (s/p ablation) Atrial Fibrillation  Rhythm:Irregular Rate:Normal  1/16 TEE: EF 55-60%, valves OK   Neuro/Psych negative neurological ROS     GI/Hepatic Neg liver ROS, GERD  Medicated,  Endo/Other  Morbid obesity  Renal/GU negative Renal ROS   Bladder cancer    Musculoskeletal  (+) Arthritis ,   Abdominal (+) + obese,   Peds  Hematology  (+) Blood dyscrasia (xarelto), ,   Anesthesia Other Findings   Reproductive/Obstetrics                            Anesthesia Physical  Anesthesia Plan  ASA: III  Anesthesia Plan: General   Post-op Pain Management:    Induction: Intravenous  PONV Risk Score and Plan: 2 and Treatment may vary due to age or medical condition  Airway Management Planned: Natural Airway and Nasal Cannula  Additional Equipment:   Intra-op Plan:   Post-operative Plan:   Informed Consent: I have reviewed the patients History and Physical, chart, labs and discussed the procedure including the risks, benefits and alternatives for the proposed anesthesia with the patient or authorized representative who has indicated his/her understanding and acceptance.   Dental advisory given  Plan Discussed with:  CRNA  Anesthesia Plan Comments:         Anesthesia Quick Evaluation

## 2017-01-30 NOTE — Discharge Instructions (Signed)
Electrical Cardioversion, Care After °This sheet gives you information about how to care for yourself after your procedure. Your health care provider may also give you more specific instructions. If you have problems or questions, contact your health care provider. °What can I expect after the procedure? °After the procedure, it is common to have: °· Some redness on the skin where the shocks were given. ° °Follow these instructions at home: °· Do not drive for 24 hours if you were given a medicine to help you relax (sedative). °· Take over-the-counter and prescription medicines only as told by your health care provider. °· Ask your health care provider how to check your pulse. Check it often. °· Rest for 48 hours after the procedure or as told by your health care provider. °· Avoid or limit your caffeine use as told by your health care provider. °Contact a health care provider if: °· You feel like your heart is beating too quickly or your pulse is not regular. °· You have a serious muscle cramp that does not go away. °Get help right away if: °· You have discomfort in your chest. °· You are dizzy or you feel faint. °· You have trouble breathing or you are short of breath. °· Your speech is slurred. °· You have trouble moving an arm or leg on one side of your body. °· Your fingers or toes turn cold or blue. °This information is not intended to replace advice given to you by your health care provider. Make sure you discuss any questions you have with your health care provider. °Document Released: 04/27/2013 Document Revised: 02/08/2016 Document Reviewed: 01/11/2016 °Elsevier Interactive Patient Education © 2018 [employer] ° °

## 2017-01-30 NOTE — Interval H&P Note (Signed)
History and Physical Interval Note:  01/30/2017 12:50 PM  Damon [name]  has presented today for surgery, with the diagnosis of AFIB  The various methods of treatment have been discussed with the patient and family. After consideration of risks, benefits and other options for treatment, the patient has consented to  Procedure(s): CARDIOVERSION (N/A) as a surgical intervention .  The patient's history has been reviewed, patient examined, no change in status, stable for surgery.  I have reviewed the patient's chart and labs.  Questions were answered to the patient's satisfaction.     Jenkins Rouge

## 2017-01-30 NOTE — Anesthesia Postprocedure Evaluation (Signed)
Anesthesia Post Note  Patient: Damon [name]  Procedure(s) Performed: Procedure(s) (LRB): CARDIOVERSION (N/A)     Anesthesia Post Evaluation  Last Vitals:  Vitals:   01/30/17 1335 01/30/17 1345  BP: 101/66 96/63  Pulse: (!) 59 (!) 59  Resp: 16 13  Temp:      Last Pain:  Vitals:   01/30/17 1330  TempSrc: Oral                 Nolon Nations

## 2017-02-02 ENCOUNTER — Encounter (HOSPITAL_COMMUNITY): Payer: Self-pay | Admitting: Cardiovascular Disease

## 2017-02-10 ENCOUNTER — Encounter (HOSPITAL_COMMUNITY): Payer: Self-pay | Admitting: Nurse Practitioner

## 2017-02-10 ENCOUNTER — Ambulatory Visit (HOSPITAL_COMMUNITY)
Admission: RE | Admit: 2017-02-10 | Discharge: 2017-02-10 | Disposition: A | Discharge status: Home / Self Care | Payer: Medicare Other | Source: Ambulatory Visit | Attending: Nurse Practitioner | Admitting: Nurse Practitioner

## 2017-02-10 VITALS — BP 110/62 | HR 59 | Ht 69.5 in | Wt 203.0 lb

## 2017-02-10 DIAGNOSIS — E559 Vitamin D deficiency, unspecified: Secondary | ICD-10-CM | POA: Diagnosis not present

## 2017-02-10 DIAGNOSIS — G4733 Obstructive sleep apnea (adult) (pediatric): Secondary | ICD-10-CM | POA: Diagnosis not present

## 2017-02-10 DIAGNOSIS — I10 Essential (primary) hypertension: Secondary | ICD-10-CM | POA: Insufficient documentation

## 2017-02-10 DIAGNOSIS — I739 Peripheral vascular disease, unspecified: Secondary | ICD-10-CM | POA: Insufficient documentation

## 2017-02-10 DIAGNOSIS — Z8551 Personal history of malignant neoplasm of bladder: Secondary | ICD-10-CM | POA: Insufficient documentation

## 2017-02-10 DIAGNOSIS — H9193 Unspecified hearing loss, bilateral: Secondary | ICD-10-CM | POA: Insufficient documentation

## 2017-02-10 DIAGNOSIS — Z86010 Personal history of colonic polyps: Secondary | ICD-10-CM | POA: Diagnosis not present

## 2017-02-10 DIAGNOSIS — Z87891 Personal history of nicotine dependence: Secondary | ICD-10-CM | POA: Diagnosis not present

## 2017-02-10 DIAGNOSIS — I340 Nonrheumatic mitral (valve) insufficiency: Secondary | ICD-10-CM | POA: Insufficient documentation

## 2017-02-10 DIAGNOSIS — R7303 Prediabetes: Secondary | ICD-10-CM | POA: Diagnosis not present

## 2017-02-10 DIAGNOSIS — E785 Hyperlipidemia, unspecified: Secondary | ICD-10-CM | POA: Diagnosis not present

## 2017-02-10 DIAGNOSIS — I481 Persistent atrial fibrillation: Secondary | ICD-10-CM | POA: Insufficient documentation

## 2017-02-10 DIAGNOSIS — Z7901 Long term (current) use of anticoagulants: Secondary | ICD-10-CM | POA: Insufficient documentation

## 2017-02-10 DIAGNOSIS — Z955 Presence of coronary angioplasty implant and graft: Secondary | ICD-10-CM | POA: Diagnosis not present

## 2017-02-10 DIAGNOSIS — I4819 Other persistent atrial fibrillation: Secondary | ICD-10-CM

## 2017-02-10 MED ORDER — CARTIA XT 180 MG PO CP24: 180.0000 mg | ORAL_CAPSULE | Freq: Every day | ORAL | 1 refills | Status: DC

## 2017-02-10 NOTE — Progress Notes (Signed)
Primary Care Physician: [name], MD Referring Physician: [name] is a 71 y.o. male with a h/o  afib s/p ablation x 2, last one was 2016 and multiple cardioversion's. He asked to be seen for return of afib since Saturday. He was in [place] and went out to eat and drank two Pepsi's and then had ice cream. As he was eating his ice cream, his heart became irregular. He went to the ER there and IV cardizem was used to slow his heart rate but he left in afib. He is wanting to purse cardioversion. No missed doses of xarelto. This is the first afib since his ablation in 2016.  F/u after successful cardioversion, 7/24. He is doing well, no issues with afib. He would like to be considered for another ablation if afib returns fairly quickly. Reminded to not miss any doses of xarelto.  Today, he denies symptoms of palpitations, chest pain, shortness of breath, orthopnea, PND, lower extremity edema, dizziness, presyncope, syncope, or neurologic sequela. The patient is tolerating medications without difficulties and is otherwise without complaint today.   Past Medical History:  Diagnosis Date  . Bladder cancer ([place])   . Hearing loss of both ears   . History of colon polyps    BENIGN  . Hyperlipidemia   . Hypertension   . Mitral regurgitation   . OSA on CPAP    cpap settign of 13  . PAD (peripheral artery disease) ([facility])    ABI--  RIGHT SIDE NORMAL LEFT SIDE MODERATELY REDUCED W/ DISTAL LEFT SFA STENOSIS//  MILD CALDICATION  . PAF (paroxysmal atrial fibrillation) ([place])     a. s/p PVI 2014; b. s/p redo PVI and CTI 01/2015  . Pre-diabetes   . Vitamin D deficiency    Past Surgical History:  Procedure Laterality Date  . ABLATION OF DYSRHYTHMIC FOCUS  01/26/2015  . ATRIAL FIBRILLATION ABLATION N/A 07/27/2012   Procedure: ATRIAL FIBRILLATION ABLATION;  Surgeon: [name], MD;  Location: [facility] CATH LAB;  Service: Cardiovascular;  Laterality: N/A;  . CARDIAC ELECTROPHYSIOLOGY  STUDY AND ABLATION  07-27-2012  DR ALLRED   SUCCESSFUL ABLATION OF A-FIB  . CARDIOVERSION  08/06/2012   Procedure: CARDIOVERSION;  Surgeon: [name], MD;  Location: [facility] ENDOSCOPY;  Service: Cardiovascular;  Laterality: N/A;  . CARDIOVERSION N/A 08/25/2014   Procedure: CARDIOVERSION;  Surgeon: Larey Dresser, MD;  Location: [place];  Service: Cardiovascular;  Laterality: N/A;  . CARDIOVERSION N/A 11/13/2014   Procedure: CARDIOVERSION;  Surgeon: Sueanne Margarita, MD;  Location: [facility] ENDOSCOPY;  Service: Cardiovascular;  Laterality: N/A;  . CARDIOVERSION N/A 12/28/2014   Procedure: CARDIOVERSION;  Surgeon: [name], MD;  Location: [facility] ENDOSCOPY;  Service: Cardiovascular;  Laterality: N/A;  . CARDIOVERSION N/A 01/30/2017   Procedure: CARDIOVERSION;  Surgeon: Josue Hector, MD;  Location: [place];  Service: Cardiovascular;  Laterality: N/A;  . CATARACT EXTRACTION W/ INTRAOCULAR LENS  IMPLANT, BILATERAL    . COLONOSCOPY WITH PROPOFOL N/A 02/13/2015   Procedure: COLONOSCOPY WITH PROPOFOL;  Surgeon: Garlan Fair, MD;  Location: [facility] ENDOSCOPY;  Service: Endoscopy;  Laterality: N/A;  . CYSTOSCOPY W/ RETROGRADES Bilateral 04/12/2013   Procedure: CYSTOSCOPY WITH RETROGRADE PYELOGRAM;  Surgeon: [name], MD;  Location: [facility];  Service: Urology;  Laterality: Bilateral;  . ELECTROPHYSIOLOGIC STUDY N/A 01/26/2015   Procedure: Atrial Fibrillation Ablation;  Surgeon: [name], MD;  Location: [place] CV LAB;  Service: Cardiovascular;  Laterality: N/A;  .  LEFT HEART CATHETERIZATION WITH CORONARY ANGIOGRAM N/A 11/24/2013   Procedure: LEFT HEART CATHETERIZATION WITH CORONARY ANGIOGRAM;  Surgeon: [name], MD;  Location: [facility] CATH LAB;  Service: Cardiovascular;  Laterality: N/A;  . PERCUTANEOUS CORONARY ROTOBLATOR INTERVENTION (PCI-R) N/A 11/28/2013   Procedure: PERCUTANEOUS CORONARY ROTOBLATOR INTERVENTION (PCI-R);  Surgeon: [name], MD;  Location: [facility]  CATH LAB;  Service: Cardiovascular;  Laterality: N/A;  . PERCUTANEOUS CORONARY STENT INTERVENTION (PCI-S)  11/24/2013   Procedure: PERCUTANEOUS CORONARY STENT INTERVENTION (PCI-S);  Surgeon: [name], MD;  Location: [facility] CATH LAB;  Service: Cardiovascular;;  . TEE WITHOUT CARDIOVERSION  07/26/2012   Procedure: TRANSESOPHAGEAL ECHOCARDIOGRAM (TEE);  Surgeon: [name], MD;  Location: [facility] ENDOSCOPY;  Service: Cardiovascular;  Laterality: N/A;  . TEE WITHOUT CARDIOVERSION N/A 08/25/2014   Procedure: TRANSESOPHAGEAL ECHOCARDIOGRAM (TEE);  Surgeon: Larey Dresser, MD;  Location: [place];  Service: Cardiovascular;  Laterality: N/A;  . TEE WITHOUT CARDIOVERSION  01/26/2015   Procedure: Transesophageal Echocardiogram (Tee);  Surgeon: [name], MD;  Location: [place] CV LAB;  Service: Cardiovascular;;  . TONSILLECTOMY  age 40  . TOTAL HIP ARTHROPLASTY  07/25/2011   Procedure: TOTAL HIP ARTHROPLASTY ANTERIOR APPROACH;  Surgeon: Mcarthur Rossetti;  Location: [facility] ORS;  Service: Orthopedics;  Laterality: Right;  . TOTAL HIP ARTHROPLASTY Left 02-08-2010  . TRANSURETHRAL RESECTION OF BLADDER TUMOR WITH GYRUS (TURBT-GYRUS) N/A 04/12/2013   Procedure: TRANSURETHRAL RESECTION OF BLADDER TUMOR WITH GYRUS (TURBT-GYRUS);  Surgeon: [name], MD;  Location: [facility];  Service: Urology;  Laterality: N/A;    Current Outpatient Prescriptions  Medication Sig Dispense Refill  . bisoprolol-hydrochlorothiazide (ZIAC) 10-6.25 MG tablet TAKE ONE TABLET BY MOUTH ONCE DAILY 90 tablet 1  . CARTIA XT 180 MG 24 hr capsule Take 1 capsule (180 mg total) by mouth daily. 90 capsule 1  . Multiple Vitamin (MULITIVITAMIN WITH MINERALS) TABS Take 1 tablet by mouth daily. FOR MEN    . NITROSTAT 0.4 MG SL tablet DISSOLVE ONE TABLET UNDER THE TONGUE EVERY 5 MINUTES AS NEEDED FOR CHEST PAIN.  DO NOT EXCEED A TOTAL OF 3 DOSES IN 15 MINUTES 25 tablet 6  . rosuvastatin (CRESTOR) 40 MG tablet TAKE  ONE TABLET BY MOUTH ONCE DAILY 90 tablet 1  . VITAMIN D, ERGOCALCIFEROL, PO Take 5,000 Units by mouth daily. Take 1 tablet daily    . XARELTO 20 MG TABS tablet TAKE ONE TABLET BY MOUTH ONCE DAILY WITH SUPPER 90 tablet 2   No current facility-administered medications for this encounter.     Allergies  Allergen Reactions  . Zetia [Ezetimibe] Other (See Comments)    Other reaction(s): Other (See Comments) myalgias     Social History   Social History  . Marital status: Married    Spouse name: N/A  . Number of children: N/A  . Years of education: N/A   Occupational History  . Not on file.   Social History Main Topics  . Smoking status: Former Smoker    Packs/day: 2.00    Years: 10.00    Types: Cigarettes    Quit date: 09/20/1981  . Smokeless tobacco: Never Used  . Alcohol use No     Comment: pt states he has stopped drinking alcohol  . Drug use: No  . Sexual activity: Not on file   Other Topics Concern  . Not on file   Social History Narrative  . No narrative on file    Family History  Problem Relation Age  of Onset  . Heart attack Mother   . Heart disease Mother   . Heart failure Father   . Heart disease Father     ROS- All systems are reviewed and negative except as per the HPI above  Physical Exam: Vitals:   02/10/17 0833  BP: 110/62  Pulse: (!) 59  Weight: 203 lb (92.1 kg)  Height: 5' 9.5" (1.765 m)   Wt Readings from Last 3 Encounters:  02/10/17 203 lb (92.1 kg)  01/30/17 200 lb (90.7 kg)  01/28/17 201 lb 9.6 oz (91.4 kg)    Labs: Lab Results  Component Value Date   NA 139 01/28/2017   K 4.0 01/28/2017   CL 106 01/28/2017   CO2 25 01/28/2017   GLUCOSE 115 (H) 01/28/2017   BUN 21 (H) 01/28/2017   CREATININE 1.11 01/28/2017   CALCIUM 9.9 01/28/2017   MG 2.1 01/15/2017   Lab Results  Component Value Date   INR 1.1 (H) 11/16/2013   Lab Results  Component Value Date   CHOL 142 01/15/2017   HDL 51 01/15/2017   LDLCALC 72 01/15/2017    TRIG 94 01/15/2017     GEN- The patient is well appearing, alert and oriented x 3 today.   Head- normocephalic, atraumatic Eyes-  Sclera clear, conjunctiva pink Ears- hearing intact Oropharynx- clear Neck- supple, no JVP Lymph- no cervical lymphadenopathy Lungs- Clear to ausculation bilaterally, normal work of breathing Heart-  regular rate and rhythm, no murmurs, rubs or gallops, PMI not laterally displaced GI- soft, NT, ND, + BS Extremities- no clubbing, cyanosis, or edema MS- no significant deformity or atrophy Skin- no rash or lesion Psych- euthymic mood, full affect Neuro- strength and sensation are intact  EKG- afib at 118 bpm, qrs int 90 ms, qtc 496 ms Epic records reviewed    Assessment and Plan: 1. Persistent afib Recent recurrent afib, first onset since ablation in 2016 Successful cardioversion Conitnue xarelto, chadsvasc score of at least 2 Continue cardizem 180 mg daily for now, restarted in ER in [place]  F/u with Dr. [name] in 4 months afib clinic as needed   Butch Penny C. Loreda Silverio, [facility] [address] [place],  [address] [phone]

## 2017-03-25 ENCOUNTER — Ambulatory Visit (INDEPENDENT_AMBULATORY_CARE_PROVIDER_SITE_OTHER): Payer: Self-pay

## 2017-03-25 ENCOUNTER — Ambulatory Visit (INDEPENDENT_AMBULATORY_CARE_PROVIDER_SITE_OTHER): Payer: Medicare Other | Admitting: Physician Assistant

## 2017-03-25 DIAGNOSIS — M7061 Trochanteric bursitis, right hip: Secondary | ICD-10-CM | POA: Diagnosis not present

## 2017-03-25 DIAGNOSIS — Z96641 Presence of right artificial hip joint: Secondary | ICD-10-CM | POA: Diagnosis not present

## 2017-03-25 MED ORDER — METHYLPREDNISOLONE ACETATE 40 MG/ML IJ SUSP
40.0000 mg | INTRAMUSCULAR | Status: AC | PRN
  Administered 2017-03-25: 40 mg via INTRA_ARTICULAR

## 2017-03-25 MED ORDER — BUPIVACAINE HCL 0.25 % IJ SOLN
4.0000 mL | INTRAMUSCULAR | Status: AC | PRN
  Administered 2017-03-25: 4 mL via INTRA_ARTICULAR

## 2017-03-25 NOTE — Progress Notes (Signed)
Office Visit Note   Patient: Damon [name]           Date of Birth: July 08, 1946           MRN: [id number] Visit Date: 03/25/2017              Requested by: [name], [address] [place], [place] [address] PCP: [name], MD   Assessment & Plan: Visit Diagnoses:  1. History of right hip replacement   2. Trochanteric bursitis, right hip     Plan: He is shown IT band stretching exercises he'll perform these. He'll return if pain persists or becomes worse. Questions were encouraged and answered.  Follow-Up Instructions: Return if symptoms worsen or fail to improve.   Orders:  Orders Placed This Encounter  Procedures  . Large Joint Injection/Arthrocentesis  . XR HIP UNILAT W OR W/O PELVIS 1V RIGHT   No orders of the defined types were placed in this encounter.     Procedures: Large Joint Inj Date/Time: 03/25/2017 6:25 PM Performed by: Pete Pelt Authorized by: Pete Pelt   Consent Given by:  Patient Indications:  Pain Location:  Hip Needle Size:  22 G Needle Length:  1.5 inches Approach:  Lateral Ultrasound Guidance: No   Fluoroscopic Guidance: No   Arthrogram: No   Medications:  40 mg methylPREDNISolone acetate 40 MG/ML; 4 mL bupivacaine 0.25 % Aspiration Attempted: No   Patient tolerance:  Patient tolerated the procedure well with no immediate complications     Clinical Data: No additional findings.   Subjective: Right hip pain  HPI Mr. Veley comes in today due to 3-4 weeks of right lateral hip pain. He states lying on the hip causes him pain. Also picking things up knee demonstrates squatting position causes pain. He denies any low back pain or radicular symptoms down either leg. Status post right total hip arthroplasty 07/25/2011. Left total hip arthroplasty was performed at 2011. Left total hip is doing well. Review of Systems No chest pain shortness breath fevers chills.  Objective: Vital Signs:  There were no vitals taken for this visit.  Physical Exam  Constitutional: He is oriented to person, place, and time. He appears well-developed and well-nourished. No distress.  Pulmonary/Chest: Effort normal.  Neurological: He is alert and oriented to person, place, and time.  Skin: He is not diaphoretic.  Psychiatric: He has a normal mood and affect.    Ortho Exam Good range of motion of both hips without pain. Negative straight leg raise bilaterally. Tenderness over the right trochanteric region and only. Well-healed right hip anterior surgical incision. Specialty Comments:  No specialty comments available.  Imaging: Xr Hip Unilat W Or W/o Pelvis 1v Right  Result Date: 03/25/2017 AP pelvis and lateral view of right hip: No acute fractures. No hardware failure. No evidence of loosening. No bony abnormalities.    PMFS History: Patient Active Problem List   Diagnosis Date Noted  . Trochanteric bursitis, right hip 03/25/2017  . Encounter for general adult medical examination with abnormal findings 03/06/2016  . Encounter for Medicare annual wellness exam 12/04/2015  . Bladder cancer ([place]) 02/16/2015  . GERD  02/16/2015  . Medication management 12/28/2013  . CAD- CFX PTCA 11/24/13 with plans for DES 11/28/13 11/25/2013  . Hyperlipidemia 06/22/2013  . Prediabetes 06/22/2013  . Vitamin D deficiency 06/22/2013  . Peripheral arterial disease ([place]) 03/22/2013  . Hypertension   . Mitral regurgitation   . Degenerative arthritis of hip 07/25/2011  .  Atrial fibrillation ([place]) 07/11/2011  . Sleep apnea 07/11/2011   Past Medical History:  Diagnosis Date  . Bladder cancer ([place])   . Hearing loss of both ears   . History of colon polyps    BENIGN  . Hyperlipidemia   . Hypertension   . Mitral regurgitation   . OSA on CPAP    cpap settign of 13  . PAD (peripheral artery disease) ([facility])    ABI--  RIGHT SIDE NORMAL LEFT SIDE MODERATELY REDUCED W/ DISTAL LEFT SFA STENOSIS//  MILD  CALDICATION  . PAF (paroxysmal atrial fibrillation) ([place])     a. s/p PVI 2014; b. s/p redo PVI and CTI 01/2015  . Pre-diabetes   . Vitamin D deficiency     Family History  Problem Relation Age of Onset  . Heart attack Mother   . Heart disease Mother   . Heart failure Father   . Heart disease Father     Past Surgical History:  Procedure Laterality Date  . ABLATION OF DYSRHYTHMIC FOCUS  01/26/2015  . ATRIAL FIBRILLATION ABLATION N/A 07/27/2012   Procedure: ATRIAL FIBRILLATION ABLATION;  Surgeon: [name], MD;  Location: [facility] CATH LAB;  Service: Cardiovascular;  Laterality: N/A;  . CARDIAC ELECTROPHYSIOLOGY STUDY AND ABLATION  07-27-2012  DR ALLRED   SUCCESSFUL ABLATION OF A-FIB  . CARDIOVERSION  08/06/2012   Procedure: CARDIOVERSION;  Surgeon: [name], MD;  Location: [facility] ENDOSCOPY;  Service: Cardiovascular;  Laterality: N/A;  . CARDIOVERSION N/A 08/25/2014   Procedure: CARDIOVERSION;  Surgeon: Larey Dresser, MD;  Location: [place];  Service: Cardiovascular;  Laterality: N/A;  . CARDIOVERSION N/A 11/13/2014   Procedure: CARDIOVERSION;  Surgeon: Sueanne Margarita, MD;  Location: [facility] ENDOSCOPY;  Service: Cardiovascular;  Laterality: N/A;  . CARDIOVERSION N/A 12/28/2014   Procedure: CARDIOVERSION;  Surgeon: [name], MD;  Location: [facility] ENDOSCOPY;  Service: Cardiovascular;  Laterality: N/A;  . CARDIOVERSION N/A 01/30/2017   Procedure: CARDIOVERSION;  Surgeon: Josue Hector, MD;  Location: [place];  Service: Cardiovascular;  Laterality: N/A;  . CATARACT EXTRACTION W/ INTRAOCULAR LENS  IMPLANT, BILATERAL    . COLONOSCOPY WITH PROPOFOL N/A 02/13/2015   Procedure: COLONOSCOPY WITH PROPOFOL;  Surgeon: Garlan Fair, MD;  Location: [facility] ENDOSCOPY;  Service: Endoscopy;  Laterality: N/A;  . CYSTOSCOPY W/ RETROGRADES Bilateral 04/12/2013   Procedure: CYSTOSCOPY WITH RETROGRADE PYELOGRAM;  Surgeon: [name], MD;  Location: [facility];  Service: Urology;  Laterality:  Bilateral;  . ELECTROPHYSIOLOGIC STUDY N/A 01/26/2015   Procedure: Atrial Fibrillation Ablation;  Surgeon: [name], MD;  Location: [place] CV LAB;  Service: Cardiovascular;  Laterality: N/A;  . LEFT HEART CATHETERIZATION WITH CORONARY ANGIOGRAM N/A 11/24/2013   Procedure: LEFT HEART CATHETERIZATION WITH CORONARY ANGIOGRAM;  Surgeon: [name], MD;  Location: [facility] CATH LAB;  Service: Cardiovascular;  Laterality: N/A;  . PERCUTANEOUS CORONARY ROTOBLATOR INTERVENTION (PCI-R) N/A 11/28/2013   Procedure: PERCUTANEOUS CORONARY ROTOBLATOR INTERVENTION (PCI-R);  Surgeon: [name], MD;  Location: [facility] CATH LAB;  Service: Cardiovascular;  Laterality: N/A;  . PERCUTANEOUS CORONARY STENT INTERVENTION (PCI-S)  11/24/2013   Procedure: PERCUTANEOUS CORONARY STENT INTERVENTION (PCI-S);  Surgeon: [name], MD;  Location: [facility] CATH LAB;  Service: Cardiovascular;;  . TEE WITHOUT CARDIOVERSION  07/26/2012   Procedure: TRANSESOPHAGEAL ECHOCARDIOGRAM (TEE);  Surgeon: [name], MD;  Location: [facility] ENDOSCOPY;  Service: Cardiovascular;  Laterality: N/A;  . TEE WITHOUT CARDIOVERSION N/A 08/25/2014   Procedure: TRANSESOPHAGEAL ECHOCARDIOGRAM (TEE);  Surgeon: Elby Showers  Aundra Dubin, MD;  Location: [facility] ENDOSCOPY;  Service: Cardiovascular;  Laterality: N/A;  . TEE WITHOUT CARDIOVERSION  01/26/2015   Procedure: Transesophageal Echocardiogram (Tee);  Surgeon: [name], MD;  Location: [place] CV LAB;  Service: Cardiovascular;;  . TONSILLECTOMY  age 18  . TOTAL HIP ARTHROPLASTY  07/25/2011   Procedure: TOTAL HIP ARTHROPLASTY ANTERIOR APPROACH;  Surgeon: Mcarthur Rossetti;  Location: [facility];  Service: Orthopedics;  Laterality: Right;  . TOTAL HIP ARTHROPLASTY Left 02-08-2010  . TRANSURETHRAL RESECTION OF BLADDER TUMOR WITH GYRUS (TURBT-GYRUS) N/A 04/12/2013   Procedure: TRANSURETHRAL RESECTION OF BLADDER TUMOR WITH GYRUS (TURBT-GYRUS);  Surgeon: [name], MD;  Location: [facility];  Service: Urology;  Laterality: N/A;   Social History   Occupational History  . Not on file.   Social History Main Topics  . Smoking status: Former Smoker    Packs/day: 2.00    Years: 10.00    Types: Cigarettes    Quit date: 09/20/1981  . Smokeless tobacco: Never Used  . Alcohol use No     Comment: pt states he has stopped drinking alcohol  . Drug use: No  . Sexual activity: Not on file

## 2017-04-02 ENCOUNTER — Encounter (HOSPITAL_COMMUNITY): Payer: Self-pay | Admitting: Nurse Practitioner

## 2017-04-02 ENCOUNTER — Ambulatory Visit (HOSPITAL_COMMUNITY)
Admission: RE | Admit: 2017-04-02 | Discharge: 2017-04-02 | Disposition: A | Discharge status: Home / Self Care | Payer: Medicare Other | Source: Ambulatory Visit | Attending: Nurse Practitioner | Admitting: Nurse Practitioner

## 2017-04-02 VITALS — BP 104/66 | HR 89 | Ht 69.5 in | Wt 200.6 lb

## 2017-04-02 DIAGNOSIS — Z87891 Personal history of nicotine dependence: Secondary | ICD-10-CM | POA: Diagnosis not present

## 2017-04-02 DIAGNOSIS — I480 Paroxysmal atrial fibrillation: Secondary | ICD-10-CM

## 2017-04-02 DIAGNOSIS — I10 Essential (primary) hypertension: Secondary | ICD-10-CM | POA: Insufficient documentation

## 2017-04-02 DIAGNOSIS — Z79899 Other long term (current) drug therapy: Secondary | ICD-10-CM | POA: Insufficient documentation

## 2017-04-02 DIAGNOSIS — Z86010 Personal history of colonic polyps: Secondary | ICD-10-CM | POA: Diagnosis not present

## 2017-04-02 DIAGNOSIS — Z7901 Long term (current) use of anticoagulants: Secondary | ICD-10-CM | POA: Insufficient documentation

## 2017-04-02 DIAGNOSIS — Z8551 Personal history of malignant neoplasm of bladder: Secondary | ICD-10-CM | POA: Diagnosis not present

## 2017-04-02 DIAGNOSIS — G4733 Obstructive sleep apnea (adult) (pediatric): Secondary | ICD-10-CM | POA: Insufficient documentation

## 2017-04-02 LAB — BASIC METABOLIC PANEL
ANION GAP: 8 (ref 5–15)
BUN: 16 mg/dL (ref 6–20)
CALCIUM: 9.4 mg/dL (ref 8.9–10.3)
CO2: 25 mmol/L (ref 22–32)
Chloride: 106 mmol/L (ref 101–111)
Creatinine, Ser: 0.95 mg/dL (ref 0.61–1.24)
GLUCOSE: 102 mg/dL — AB (ref 65–99)
POTASSIUM: 3.7 mmol/L (ref 3.5–5.1)
Sodium: 139 mmol/L (ref 135–145)

## 2017-04-02 LAB — CBC
HEMATOCRIT: 48.7 % (ref 39.0–52.0)
Hemoglobin: 16.8 g/dL (ref 13.0–17.0)
MCH: 30.4 pg (ref 26.0–34.0)
MCHC: 34.5 g/dL (ref 30.0–36.0)
MCV: 88.2 fL (ref 78.0–100.0)
PLATELETS: 233 10*3/uL (ref 150–400)
RBC: 5.52 MIL/uL (ref 4.22–5.81)
RDW: 13.3 % (ref 11.5–15.5)
WBC: 9.4 10*3/uL (ref 4.0–10.5)

## 2017-04-02 NOTE — Progress Notes (Addendum)
Primary Care Physician: [name], MD Referring Physician: [name] is a 71 y.o. male with a h/o  afib s/p ablation x 2, 2014, 2016 and h/o prior multiple cardioversion's. He asked to be seen for return of afib since Tuesday. He is requesting a cardioversion. His last cardioversion was just in July, after visiting in [place]. He does not know of a trigger for this afib episode. He felt that in July, it was related to diet while traveling.  He cannot remember antiarrythmic's he has been on the past, but thinks he was on Tikosyn at one point, but it was stopped after an ablation. He denies any missed doses of Xarelto.  Today, he denies symptoms of palpitations, chest pain, shortness of breath, orthopnea, PND, lower extremity edema, dizziness, presyncope, syncope, or neurologic sequela. The patient is tolerating medications without difficulties and is otherwise without complaint today.   Past Medical History:  Diagnosis Date  . Bladder cancer ([place])   . Hearing loss of both ears   . History of colon polyps    BENIGN  . Hyperlipidemia   . Hypertension   . Mitral regurgitation   . OSA on CPAP    cpap settign of 13  . PAD (peripheral artery disease) (H[facility])    ABI--  RIGHT SIDE NORMAL LEFT SIDE MODERATELY REDUCED W/ DISTAL LEFT SFA STENOSIS//  MILD CALDICATION  . PAF (paroxysmal atrial fibrillation) ([place])     a. s/p PVI 2014; b. s/p redo PVI and CTI 01/2015  . Pre-diabetes   . Vitamin D deficiency    Past Surgical History:  Procedure Laterality Date  . ABLATION OF DYSRHYTHMIC FOCUS  01/26/2015  . ATRIAL FIBRILLATION ABLATION N/A 07/27/2012   Procedure: ATRIAL FIBRILLATION ABLATION;  Surgeon: [name], MD;  Location: [facility] CATH LAB;  Service: Cardiovascular;  Laterality: N/A;  . CARDIAC ELECTROPHYSIOLOGY STUDY AND ABLATION  07-27-2012  DR ALLRED   SUCCESSFUL ABLATION OF A-FIB  . CARDIOVERSION  08/06/2012   Procedure: CARDIOVERSION;  Surgeon: [name], MD;   Location: [facility] ENDOSCOPY;  Service: Cardiovascular;  Laterality: N/A;  . CARDIOVERSION N/A 08/25/2014   Procedure: CARDIOVERSION;  Surgeon: Larey Dresser, MD;  Location: [place];  Service: Cardiovascular;  Laterality: N/A;  . CARDIOVERSION N/A 11/13/2014   Procedure: CARDIOVERSION;  Surgeon: Sueanne Margarita, MD;  Location: [facility] ENDOSCOPY;  Service: Cardiovascular;  Laterality: N/A;  . CARDIOVERSION N/A 12/28/2014   Procedure: CARDIOVERSION;  Surgeon: [name], MD;  Location: [facility] ENDOSCOPY;  Service: Cardiovascular;  Laterality: N/A;  . CARDIOVERSION N/A 01/30/2017   Procedure: CARDIOVERSION;  Surgeon: Josue Hector, MD;  Location: [place];  Service: Cardiovascular;  Laterality: N/A;  . CATARACT EXTRACTION W/ INTRAOCULAR LENS  IMPLANT, BILATERAL    . COLONOSCOPY WITH PROPOFOL N/A 02/13/2015   Procedure: COLONOSCOPY WITH PROPOFOL;  Surgeon: Garlan Fair, MD;  Location: [facility] ENDOSCOPY;  Service: Endoscopy;  Laterality: N/A;  . CYSTOSCOPY W/ RETROGRADES Bilateral 04/12/2013   Procedure: CYSTOSCOPY WITH RETROGRADE PYELOGRAM;  Surgeon: [name], MD;  Location: [facility];  Service: Urology;  Laterality: Bilateral;  . ELECTROPHYSIOLOGIC STUDY N/A 01/26/2015   Procedure: Atrial Fibrillation Ablation;  Surgeon: [name], MD;  Location: [place] CV LAB;  Service: Cardiovascular;  Laterality: N/A;  . LEFT HEART CATHETERIZATION WITH CORONARY ANGIOGRAM N/A 11/24/2013   Procedure: LEFT HEART CATHETERIZATION WITH CORONARY ANGIOGRAM;  Surgeon: [name], MD;  Location: [facility] CATH LAB;  Service: Cardiovascular;  Laterality: N/A;  .  PERCUTANEOUS CORONARY ROTOBLATOR INTERVENTION (PCI-R) N/A 11/28/2013   Procedure: PERCUTANEOUS CORONARY ROTOBLATOR INTERVENTION (PCI-R);  Surgeon: [name], MD;  Location: [facility] CATH LAB;  Service: Cardiovascular;  Laterality: N/A;  . PERCUTANEOUS CORONARY STENT INTERVENTION (PCI-S)  11/24/2013   Procedure: PERCUTANEOUS CORONARY STENT  INTERVENTION (PCI-S);  Surgeon: [name], MD;  Location: [facility] CATH LAB;  Service: Cardiovascular;;  . TEE WITHOUT CARDIOVERSION  07/26/2012   Procedure: TRANSESOPHAGEAL ECHOCARDIOGRAM (TEE);  Surgeon: [name], MD;  Location: [facility] ENDOSCOPY;  Service: Cardiovascular;  Laterality: N/A;  . TEE WITHOUT CARDIOVERSION N/A 08/25/2014   Procedure: TRANSESOPHAGEAL ECHOCARDIOGRAM (TEE);  Surgeon: Larey Dresser, MD;  Location: [place];  Service: Cardiovascular;  Laterality: N/A;  . TEE WITHOUT CARDIOVERSION  01/26/2015   Procedure: Transesophageal Echocardiogram (Tee);  Surgeon: [name], MD;  Location: [place] CV LAB;  Service: Cardiovascular;;  . TONSILLECTOMY  age 59  . TOTAL HIP ARTHROPLASTY  07/25/2011   Procedure: TOTAL HIP ARTHROPLASTY ANTERIOR APPROACH;  Surgeon: Mcarthur Rossetti;  Location: [facility];  Service: Orthopedics;  Laterality: Right;  . TOTAL HIP ARTHROPLASTY Left 02-08-2010  . TRANSURETHRAL RESECTION OF BLADDER TUMOR WITH GYRUS (TURBT-GYRUS) N/A 04/12/2013   Procedure: TRANSURETHRAL RESECTION OF BLADDER TUMOR WITH GYRUS (TURBT-GYRUS);  Surgeon: [name], MD;  Location: [facility];  Service: Urology;  Laterality: N/A;    Current Outpatient Prescriptions  Medication Sig Dispense Refill  . bisoprolol-hydrochlorothiazide (ZIAC) 10-6.25 MG tablet TAKE ONE TABLET BY MOUTH ONCE DAILY 90 tablet 1  . CARTIA XT 180 MG 24 hr capsule Take 1 capsule (180 mg total) by mouth daily. 90 capsule 1  . fluorometholone (FML) 0.1 % ophthalmic suspension     . Multiple Vitamin (MULITIVITAMIN WITH MINERALS) TABS Take 1 tablet by mouth daily. FOR MEN    . NITROSTAT 0.4 MG SL tablet DISSOLVE ONE TABLET UNDER THE TONGUE EVERY 5 MINUTES AS NEEDED FOR CHEST PAIN.  DO NOT EXCEED A TOTAL OF 3 DOSES IN 15 MINUTES 25 tablet 6  . rosuvastatin (CRESTOR) 40 MG tablet TAKE ONE TABLET BY MOUTH ONCE DAILY 90 tablet 1  . VITAMIN D, ERGOCALCIFEROL, PO Take 5,000 Units by mouth  daily. Take 1 tablet daily    . XARELTO 20 MG TABS tablet TAKE ONE TABLET BY MOUTH ONCE DAILY WITH SUPPER 90 tablet 2   No current facility-administered medications for this encounter.     Allergies  Allergen Reactions  . Zetia [Ezetimibe] Other (See Comments)    Other reaction(s): Other (See Comments) myalgias     Social History   Social History  . Marital status: Married    Spouse name: N/A  . Number of children: N/A  . Years of education: N/A   Occupational History  . Not on file.   Social History Main Topics  . Smoking status: Former Smoker    Packs/day: 2.00    Years: 10.00    Types: Cigarettes    Quit date: 09/20/1981  . Smokeless tobacco: Never Used  . Alcohol use No     Comment: pt states he has stopped drinking alcohol  . Drug use: No  . Sexual activity: Not on file   Other Topics Concern  . Not on file   Social History Narrative  . No narrative on file    Family History  Problem Relation Age of Onset  . Heart attack Mother   . Heart disease Mother   . Heart failure Father   . Heart disease Father  ROS- All systems are reviewed and negative except as per the HPI above  Physical Exam: Vitals:   04/02/17 1343  BP: 104/66  Pulse: 89  Weight: 200 lb 9.6 oz (91 kg)  Height: 5' 9.5" (1.765 m)   Wt Readings from Last 3 Encounters:  04/02/17 200 lb 9.6 oz (91 kg)  02/10/17 203 lb (92.1 kg)  01/30/17 200 lb (90.7 kg)    Labs: Lab Results  Component Value Date   NA 139 01/28/2017   K 4.0 01/28/2017   CL 106 01/28/2017   CO2 25 01/28/2017   GLUCOSE 115 (H) 01/28/2017   BUN 21 (H) 01/28/2017   CREATININE 1.11 01/28/2017   CALCIUM 9.9 01/28/2017   MG 2.1 01/15/2017   Lab Results  Component Value Date   INR 1.1 (H) 11/16/2013   Lab Results  Component Value Date   CHOL 142 01/15/2017   HDL 51 01/15/2017   LDLCALC 72 01/15/2017   TRIG 94 01/15/2017     GEN- The patient is well appearing, alert and oriented x 3 today.   Head-  normocephalic, atraumatic Eyes-  Sclera clear, conjunctiva pink Ears- hearing intact Oropharynx- clear Neck- supple, no JVP Lymph- no cervical lymphadenopathy Lungs- Clear to ausculation bilaterally, normal work of breathing Heart-  irregular rate and rhythm, no murmurs, rubs or gallops, PMI not laterally displaced GI- soft, NT, ND, + BS Extremities- no clubbing, cyanosis, or edema MS- no significant deformity or atrophy Skin- no rash or lesion Psych- euthymic mood, full affect Neuro- strength and sensation are intact  EKG- afib at 89  bpm, qrs int 90 ms, qtc 459 ms Epic records reviewed  Assessment and Plan: 1. Paroxysmal afib Recurrent afib in July, with successful cardioversion, first onset since ablation in 2016 Now back in afib since Tuesday Will schedule for cardioversion 9/17 Continue xarelto, chadsvasc score of at least 2, no missed doses Continue cardizem 180 mg daily   F/u with Dr. [name] in one month, he wants to discuss repeat ablation afib clinic as needed   [place]. Hermon Zea, [facility] [address] [place], [place] [address] [phone]

## 2017-04-02 NOTE — Patient Instructions (Signed)
Cardioversion scheduled for Monday, September 17th  - Arrive at the [facility] and go to admitting at 7:30am  -Do not eat or drink anything after midnight the night prior to your procedure.  - Take all your medication with a sip of water prior to arrival.  - You will not be able to drive home after your procedure.  Scheduler will be in touch with you to have appointment with Dr. [name] moved up to within 1 month from cardioversion.

## 2017-04-03 ENCOUNTER — Other Ambulatory Visit: Payer: Self-pay | Admitting: Nurse Practitioner

## 2017-04-06 ENCOUNTER — Encounter (HOSPITAL_COMMUNITY)
Admission: RE | Disposition: A | Discharge status: Home / Self Care | Payer: Self-pay | Source: Ambulatory Visit | Attending: Cardiology

## 2017-04-06 ENCOUNTER — Encounter (HOSPITAL_COMMUNITY): Payer: Self-pay | Admitting: *Deleted

## 2017-04-06 ENCOUNTER — Ambulatory Visit (HOSPITAL_COMMUNITY): Payer: Medicare Other | Admitting: Anesthesiology

## 2017-04-06 ENCOUNTER — Ambulatory Visit (HOSPITAL_COMMUNITY)
Admission: RE | Admit: 2017-04-06 | Discharge: 2017-04-06 | Disposition: A | Discharge status: Home / Self Care | Payer: Medicare Other | Source: Ambulatory Visit | Attending: Cardiology | Admitting: Cardiology

## 2017-04-06 DIAGNOSIS — I4891 Unspecified atrial fibrillation: Secondary | ICD-10-CM | POA: Diagnosis present

## 2017-04-06 DIAGNOSIS — I10 Essential (primary) hypertension: Secondary | ICD-10-CM | POA: Diagnosis not present

## 2017-04-06 DIAGNOSIS — I480 Paroxysmal atrial fibrillation: Secondary | ICD-10-CM | POA: Diagnosis not present

## 2017-04-06 DIAGNOSIS — Z8551 Personal history of malignant neoplasm of bladder: Secondary | ICD-10-CM | POA: Insufficient documentation

## 2017-04-06 DIAGNOSIS — Z7901 Long term (current) use of anticoagulants: Secondary | ICD-10-CM | POA: Insufficient documentation

## 2017-04-06 DIAGNOSIS — E559 Vitamin D deficiency, unspecified: Secondary | ICD-10-CM | POA: Diagnosis not present

## 2017-04-06 DIAGNOSIS — Z87891 Personal history of nicotine dependence: Secondary | ICD-10-CM | POA: Diagnosis not present

## 2017-04-06 DIAGNOSIS — G4733 Obstructive sleep apnea (adult) (pediatric): Secondary | ICD-10-CM | POA: Diagnosis not present

## 2017-04-06 DIAGNOSIS — E785 Hyperlipidemia, unspecified: Secondary | ICD-10-CM | POA: Diagnosis not present

## 2017-04-06 DIAGNOSIS — Z79899 Other long term (current) drug therapy: Secondary | ICD-10-CM | POA: Diagnosis not present

## 2017-04-06 HISTORY — PX: CARDIOVERSION: SHX1299

## 2017-04-06 SURGERY — CARDIOVERSION
Anesthesia: General

## 2017-04-06 MED ORDER — SODIUM CHLORIDE 0.9 % IV SOLN
INTRAVENOUS | Status: DC
  Administered 2017-04-06: 08:00:00 via INTRAVENOUS

## 2017-04-06 MED ORDER — LIDOCAINE HCL (CARDIAC) 20 MG/ML IV SOLN
INTRAVENOUS | Status: DC | PRN
  Administered 2017-04-06: 60 mg via INTRAVENOUS

## 2017-04-06 MED ORDER — PROPOFOL 10 MG/ML IV BOLUS
INTRAVENOUS | Status: DC | PRN
  Administered 2017-04-06: 80 mg via INTRAVENOUS

## 2017-04-06 NOTE — CV Procedure (Signed)
    Cardioversion Note  KAWON WILLCUTT [id number] 02-21-1946  Procedure: DC Cardioversion Indications: atrial fibrillation  Procedure Details Consent: Obtained Time Out: Verified patient identification, verified procedure, site/side was marked, verified correct patient position, special equipment/implants available, Radiology Safety Procedures followed,  medications/allergies/relevent history reviewed, required imaging and test results available.  Performed  The patient has been on adequate anticoagulation.  The patient received IV propofol administered by anesthesia staff for sedation.  Synchronous cardioversion was performed at 120 joules.  The cardioversion was successful.   Complications: No apparent complications Patient did tolerate procedure well.   [name], MD, [facility] 04/06/2017, 9:35 AM

## 2017-04-06 NOTE — Interval H&P Note (Signed)
History and Physical Interval Note:  04/06/2017 8:24 AM  Damon [name]  has presented today for surgery, with the diagnosis of AFIB  The various methods of treatment have been discussed with the patient and family. After consideration of risks, benefits and other options for treatment, the patient has consented to  Procedure(s): CARDIOVERSION (N/A) as a surgical intervention .  The patient's history has been reviewed, patient examined, no change in status, stable for surgery.  I have reviewed the patient's chart and labs.  Questions were answered to the patient's satisfaction.     [name]

## 2017-04-06 NOTE — Anesthesia Postprocedure Evaluation (Signed)
Anesthesia Post Note  Patient: DSHAWN MCNAY  Procedure(s) Performed: Procedure(s) (LRB): CARDIOVERSION (N/A)     Patient location during evaluation: PACU Anesthesia Type: General Level of consciousness: awake and alert Pain management: pain level controlled Vital Signs Assessment: post-procedure vital signs reviewed and stable Respiratory status: spontaneous breathing, nonlabored ventilation, respiratory function stable and patient connected to nasal cannula oxygen Cardiovascular status: blood pressure returned to baseline and stable Postop Assessment: no apparent nausea or vomiting Anesthetic complications: no    Last Vitals:  Vitals:   04/06/17 0943 04/06/17 0950  BP: 97/65 108/68  Pulse: 65 65  Resp: 13 12  Temp:    SpO2: 98% 97%    Last Pain:  Vitals:   04/06/17 0933  TempSrc: Oral                 Ryan P Ellender

## 2017-04-06 NOTE — H&P (View-Only) (Signed)
Primary Care Physician: [name], MD Referring Physician: [name] is a 71 y.o. male with a h/o  afib s/p ablation x 2, 2014, 2016 and h/o prior multiple cardioversion's. He asked to be seen for return of afib since Tuesday. He is requesting a cardioversion. His last cardioversion was just in July, after visiting in [place]. He does not know of a trigger for this afib episode. He felt that in July, it was related to diet while traveling.  He cannot remember antiarrythmic's he has been on the past, but thinks he was on Tikosyn at one point, but it was stopped after an ablation. He denies any missed doses of Xarelto.  Today, he denies symptoms of palpitations, chest pain, shortness of breath, orthopnea, PND, lower extremity edema, dizziness, presyncope, syncope, or neurologic sequela. The patient is tolerating medications without difficulties and is otherwise without complaint today.   Past Medical History:  Diagnosis Date  . Bladder cancer ([place])   . Hearing loss of both ears   . History of colon polyps    BENIGN  . Hyperlipidemia   . Hypertension   . Mitral regurgitation   . OSA on CPAP    cpap settign of 13  . PAD (peripheral artery disease) ([facility])    ABI--  RIGHT SIDE NORMAL LEFT SIDE MODERATELY REDUCED W/ DISTAL LEFT SFA STENOSIS//  MILD CALDICATION  . PAF (paroxysmal atrial fibrillation) ([place])     a. s/p PVI 2014; b. s/p redo PVI and CTI 01/2015  . Pre-diabetes   . Vitamin D deficiency    Past Surgical History:  Procedure Laterality Date  . ABLATION OF DYSRHYTHMIC FOCUS  01/26/2015  . ATRIAL FIBRILLATION ABLATION N/A 07/27/2012   Procedure: ATRIAL FIBRILLATION ABLATION;  Surgeon: [name], MD;  Location: [facility] CATH LAB;  Service: Cardiovascular;  Laterality: N/A;  . CARDIAC ELECTROPHYSIOLOGY STUDY AND ABLATION  07-27-2012  DR ALLRED   SUCCESSFUL ABLATION OF A-FIB  . CARDIOVERSION  08/06/2012   Procedure: CARDIOVERSION;  Surgeon: [name], MD;   Location: [facility] ENDOSCOPY;  Service: Cardiovascular;  Laterality: N/A;  . CARDIOVERSION N/A 08/25/2014   Procedure: CARDIOVERSION;  Surgeon: Larey Dresser, MD;  Location: [place];  Service: Cardiovascular;  Laterality: N/A;  . CARDIOVERSION N/A 11/13/2014   Procedure: CARDIOVERSION;  Surgeon: Sueanne Margarita, MD;  Location: [facility] ENDOSCOPY;  Service: Cardiovascular;  Laterality: N/A;  . CARDIOVERSION N/A 12/28/2014   Procedure: CARDIOVERSION;  Surgeon: [name], MD;  Location: [facility] ENDOSCOPY;  Service: Cardiovascular;  Laterality: N/A;  . CARDIOVERSION N/A 01/30/2017   Procedure: CARDIOVERSION;  Surgeon: Josue Hector, MD;  Location: [place];  Service: Cardiovascular;  Laterality: N/A;  . CATARACT EXTRACTION W/ INTRAOCULAR LENS  IMPLANT, BILATERAL    . COLONOSCOPY WITH PROPOFOL N/A 02/13/2015   Procedure: COLONOSCOPY WITH PROPOFOL;  Surgeon: Garlan Fair, MD;  Location: [facility] ENDOSCOPY;  Service: Endoscopy;  Laterality: N/A;  . CYSTOSCOPY W/ RETROGRADES Bilateral 04/12/2013   Procedure: CYSTOSCOPY WITH RETROGRADE PYELOGRAM;  Surgeon: [name], MD;  Location: [facility];  Service: Urology;  Laterality: Bilateral;  . ELECTROPHYSIOLOGIC STUDY N/A 01/26/2015   Procedure: Atrial Fibrillation Ablation;  Surgeon: [name], MD;  Location: [place] CV LAB;  Service: Cardiovascular;  Laterality: N/A;  . LEFT HEART CATHETERIZATION WITH CORONARY ANGIOGRAM N/A 11/24/2013   Procedure: LEFT HEART CATHETERIZATION WITH CORONARY ANGIOGRAM;  Surgeon: [name], MD;  Location: [name] CATH LAB;  Service: Cardiovascular;  Laterality: N/A;  .  PERCUTANEOUS CORONARY ROTOBLATOR INTERVENTION (PCI-R) N/A 11/28/2013   Procedure: PERCUTANEOUS CORONARY ROTOBLATOR INTERVENTION (PCI-R);  Surgeon: [name], MD;  Location: [facility] CATH LAB;  Service: Cardiovascular;  Laterality: N/A;  . PERCUTANEOUS CORONARY STENT INTERVENTION (PCI-S)  11/24/2013   Procedure: PERCUTANEOUS CORONARY STENT  INTERVENTION (PCI-S);  Surgeon: [name], MD;  Location: [facility] CATH LAB;  Service: Cardiovascular;;  . TEE WITHOUT CARDIOVERSION  07/26/2012   Procedure: TRANSESOPHAGEAL ECHOCARDIOGRAM (TEE);  Surgeon: [name], MD;  Location: [facility] ENDOSCOPY;  Service: Cardiovascular;  Laterality: N/A;  . TEE WITHOUT CARDIOVERSION N/A 08/25/2014   Procedure: TRANSESOPHAGEAL ECHOCARDIOGRAM (TEE);  Surgeon: Larey Dresser, MD;  Location: [place];  Service: Cardiovascular;  Laterality: N/A;  . TEE WITHOUT CARDIOVERSION  01/26/2015   Procedure: Transesophageal Echocardiogram (Tee);  Surgeon: [name], MD;  Location: [place] CV LAB;  Service: Cardiovascular;;  . TONSILLECTOMY  age 46  . TOTAL HIP ARTHROPLASTY  07/25/2011   Procedure: TOTAL HIP ARTHROPLASTY ANTERIOR APPROACH;  Surgeon: Mcarthur Rossetti;  Location: [facility];  Service: Orthopedics;  Laterality: Right;  . TOTAL HIP ARTHROPLASTY Left 02-08-2010  . TRANSURETHRAL RESECTION OF BLADDER TUMOR WITH GYRUS (TURBT-GYRUS) N/A 04/12/2013   Procedure: TRANSURETHRAL RESECTION OF BLADDER TUMOR WITH GYRUS (TURBT-GYRUS);  Surgeon: [name], MD;  Location: [facility];  Service: Urology;  Laterality: N/A;    Current Outpatient Prescriptions  Medication Sig Dispense Refill  . bisoprolol-hydrochlorothiazide (ZIAC) 10-6.25 MG tablet TAKE ONE TABLET BY MOUTH ONCE DAILY 90 tablet 1  . CARTIA XT 180 MG 24 hr capsule Take 1 capsule (180 mg total) by mouth daily. 90 capsule 1  . fluorometholone (FML) 0.1 % ophthalmic suspension     . Multiple Vitamin (MULITIVITAMIN WITH MINERALS) TABS Take 1 tablet by mouth daily. FOR MEN    . NITROSTAT 0.4 MG SL tablet DISSOLVE ONE TABLET UNDER THE TONGUE EVERY 5 MINUTES AS NEEDED FOR CHEST PAIN.  DO NOT EXCEED A TOTAL OF 3 DOSES IN 15 MINUTES 25 tablet 6  . rosuvastatin (CRESTOR) 40 MG tablet TAKE ONE TABLET BY MOUTH ONCE DAILY 90 tablet 1  . VITAMIN D, ERGOCALCIFEROL, PO Take 5,000 Units by mouth  daily. Take 1 tablet daily    . XARELTO 20 MG TABS tablet TAKE ONE TABLET BY MOUTH ONCE DAILY WITH SUPPER 90 tablet 2   No current facility-administered medications for this encounter.     Allergies  Allergen Reactions  . Zetia [Ezetimibe] Other (See Comments)    Other reaction(s): Other (See Comments) myalgias     Social History   Social History  . Marital status: Married    Spouse name: N/A  . Number of children: N/A  . Years of education: N/A   Occupational History  . Not on file.   Social History Main Topics  . Smoking status: Former Smoker    Packs/day: 2.00    Years: 10.00    Types: Cigarettes    Quit date: 09/20/1981  . Smokeless tobacco: Never Used  . Alcohol use No     Comment: pt states he has stopped drinking alcohol  . Drug use: No  . Sexual activity: Not on file   Other Topics Concern  . Not on file   Social History Narrative  . No narrative on file    Family History  Problem Relation Age of Onset  . Heart attack Mother   . Heart disease Mother   . Heart failure Father   . Heart disease Father  ROS- All systems are reviewed and negative except as per the HPI above  Physical Exam: Vitals:   04/02/17 1343  BP: 104/66  Pulse: 89  Weight: 200 lb 9.6 oz (91 kg)  Height: 5' 9.5" (1.765 m)   Wt Readings from Last 3 Encounters:  04/02/17 200 lb 9.6 oz (91 kg)  02/10/17 203 lb (92.1 kg)  01/30/17 200 lb (90.7 kg)    Labs: Lab Results  Component Value Date   NA 139 01/28/2017   K 4.0 01/28/2017   CL 106 01/28/2017   CO2 25 01/28/2017   GLUCOSE 115 (H) 01/28/2017   BUN 21 (H) 01/28/2017   CREATININE 1.11 01/28/2017   CALCIUM 9.9 01/28/2017   MG 2.1 01/15/2017   Lab Results  Component Value Date   INR 1.1 (H) 11/16/2013   Lab Results  Component Value Date   CHOL 142 01/15/2017   HDL 51 01/15/2017   LDLCALC 72 01/15/2017   TRIG 94 01/15/2017     GEN- The patient is well appearing, alert and oriented x 3 today.   Head-  normocephalic, atraumatic Eyes-  Sclera clear, conjunctiva pink Ears- hearing intact Oropharynx- clear Neck- supple, no JVP Lymph- no cervical lymphadenopathy Lungs- Clear to ausculation bilaterally, normal work of breathing Heart-  irregular rate and rhythm, no murmurs, rubs or gallops, PMI not laterally displaced GI- soft, NT, ND, + BS Extremities- no clubbing, cyanosis, or edema MS- no significant deformity or atrophy Skin- no rash or lesion Psych- euthymic mood, full affect Neuro- strength and sensation are intact  EKG- afib at 89  bpm, qrs int 90 ms, qtc 459 ms Epic records reviewed  Assessment and Plan: 1. Paroxysmal afib Recurrent afib in July, with successful cardioversion, first onset since ablation in 2016 Now back in afib since Tuesday Will schedule for cardioversion 9/17 Continue xarelto, chadsvasc score of at least 2, no missed doses Continue cardizem 180 mg daily   F/u with Dr. [name] in one month, he wants to discuss repeat ablation afib clinic as needed   [place]. Elchanan Bob, [facility] [address] [place], [place] [address] [phone]

## 2017-04-06 NOTE — Discharge Instructions (Signed)
Electrical Cardioversion, Care After °This sheet gives you information about how to care for yourself after your procedure. Your health care provider may also give you more specific instructions. If you have problems or questions, contact your health care provider. °What can I expect after the procedure? °After the procedure, it is common to have: °· Some redness on the skin where the shocks were given. ° °Follow these instructions at home: °· Do not drive for 24 hours if you were given a medicine to help you relax (sedative). °· Take over-the-counter and prescription medicines only as told by your health care provider. °· Ask your health care provider how to check your pulse. Check it often. °· Rest for 48 hours after the procedure or as told by your health care provider. °· Avoid or limit your caffeine use as told by your health care provider. °Contact a health care provider if: °· You feel like your heart is beating too quickly or your pulse is not regular. °· You have a serious muscle cramp that does not go away. °Get help right away if: °· You have discomfort in your chest. °· You are dizzy or you feel faint. °· You have trouble breathing or you are short of breath. °· Your speech is slurred. °· You have trouble moving an arm or leg on one side of your body. °· Your fingers or toes turn cold or blue. °This information is not intended to replace advice given to you by your health care provider. Make sure you discuss any questions you have with your health care provider. °Document Released: 04/27/2013 Document Revised: 02/08/2016 Document Reviewed: 01/11/2016 °Elsevier Interactive Patient Education © 2018 [employer] ° °

## 2017-04-06 NOTE — Anesthesia Preprocedure Evaluation (Addendum)
Anesthesia Evaluation  Patient identified by MRN, date of birth, ID band Patient awake    Reviewed: Allergy & Precautions, NPO status , Patient's Chart, lab work & pertinent test results  History of Anesthesia Complications Negative for: history of anesthetic complications  Airway Mallampati: II  TM Distance: >3 FB Neck ROM: Full    Dental  (+) Dental Advisory Given, Teeth Intact   Pulmonary sleep apnea and Continuous Positive Airway Pressure Ventilation , former smoker,    breath sounds clear to auscultation       Cardiovascular hypertension, Pt. on medications and Pt. on home beta blockers (-) angina+ CAD, + Cardiac Stents and + Peripheral Vascular Disease  + dysrhythmias (s/p ablation) Atrial Fibrillation + Valvular Problems/Murmurs MR  Rhythm:Irregular Rate:Normal  ECG: A-fib, rate 89  Left ventricle: Systolic function was normal. The estimated ejection fraction was in the range of 60% to 65%. Aortic valve: No evidence of vegetation. Mitral valve: No evidence of vegetation. Left atrium: No evidence of thrombus in the atrial cavity or appendage. Tricuspid valve: No evidence of vegetation. Pulmonic valve: No evidence of vegetation.   Neuro/Psych negative neurological ROS  negative psych ROS   GI/Hepatic Neg liver ROS,   Endo/Other  Morbid obesity  Renal/GU negative Renal ROS   Bladder cancer    Musculoskeletal  (+) Arthritis ,   Abdominal (+) + obese,   Peds  Hematology   Anesthesia Other Findings   Reproductive/Obstetrics                           Anesthesia Physical  Anesthesia Plan  ASA: III  Anesthesia Plan: General   Post-op Pain Management:    Induction: Intravenous  PONV Risk Score and Plan: 2 and Propofol infusion and Treatment may vary due to age or medical condition  Airway Management Planned: Natural Airway  Additional Equipment:   Intra-op Plan:    Post-operative Plan:   Informed Consent: I have reviewed the patients History and Physical, chart, labs and discussed the procedure including the risks, benefits and alternatives for the proposed anesthesia with the patient or authorized representative who has indicated his/her understanding and acceptance.   Dental advisory given  Plan Discussed with: CRNA  Anesthesia Plan Comments:         Anesthesia Quick Evaluation

## 2017-04-06 NOTE — Transfer of Care (Signed)
Immediate Anesthesia Transfer of Care Note  Patient: Damon [name]  Procedure(s) Performed: Procedure(s): CARDIOVERSION (N/A)  Patient Location: PACU  Anesthesia Type:General  Level of Consciousness: oriented, drowsy and patient cooperative  Airway & Oxygen Therapy: Patient Spontanous Breathing and Patient connected to nasal cannula oxygen  Post-op Assessment: Report given to RN and Post -op Vital signs reviewed and stable  Post vital signs: Reviewed  Last Vitals:  Vitals:   04/06/17 0759  BP: 117/77  Pulse: 91  Resp: 14  Temp: 36.5 C  SpO2: 95%    Last Pain:  Vitals:   04/06/17 0759  TempSrc: Oral         Complications: No apparent anesthesia complications

## 2017-04-08 ENCOUNTER — Encounter: Payer: Self-pay | Admitting: Internal Medicine

## 2017-04-08 ENCOUNTER — Ambulatory Visit (INDEPENDENT_AMBULATORY_CARE_PROVIDER_SITE_OTHER): Payer: Medicare Other | Admitting: *Deleted

## 2017-04-08 DIAGNOSIS — Z23 Encounter for immunization: Secondary | ICD-10-CM | POA: Diagnosis not present

## 2017-05-04 ENCOUNTER — Encounter: Payer: Self-pay | Admitting: Internal Medicine

## 2017-05-04 NOTE — Progress Notes (Signed)
[facility]   [name], M.D.     Uvaldo Bristle. Silverio Lay, P.A.-C Liane Comber, [place]                [address] [place] [address] Telephone [phone] Telefax [phone] Annual  Screening/Preventative Visit  & Comprehensive Evaluation & Examination     This very nice 71 y.o. MWM presents for a Screening/Preventative Visit & comprehensive evaluation and management of multiple medical co-morbidities.  Patient has been followed for HTN, ASHD, Prediabetes, Hyperlipidemia and Vitamin D Deficiency.  Patient is s/p TUR of Bladder Ca in 2014 (Dr Tresa Moore). Patient also is on CPAP for OSA with improved restorative sleep.      HTN predates since 1996. Patient's BP has been controlled at home.  Patient has ASCAD  S/p PCA/stenting followed by Rotator Ablation in 2015 (Dr Aundra Dubin).  Today's BP is at goal - 124/70. Patient also has hx/o pAfib (on Xarelto) with RFA  x 2 in 2014/2016 and recent successful CV by Dr Liane Comber 1 month ago (Sept 17). Patient denies any cardiac symptoms as chest pain, palpitations, shortness of breath, dizziness or ankle swelling.     Patient's hyperlipidemia is controlled with diet and medications. Patient denies myalgias or other medication SE's. Last lipids were at goal: Lab Results  Component Value Date   CHOL 161 05/05/2017   HDL 68 05/05/2017   LDLCALC 72 01/15/2017   TRIG 68 05/05/2017   CHOLHDL 2.4 05/05/2017      Patient has prediabetes with A1c 5.7% in 2011 / 5.9% in 2013 and patient denies reactive hypoglycemic symptoms, visual blurring, diabetic polys or paresthesias. Last A1c was at goal: Lab Results  Component Value Date   HGBA1C 5.4 09/16/2016       Finally, patient has history of Vitamin D Deficiency of  "27" in 2008 and last vitamin D was at goal: Lab Results  Component Value Date   VD25OH 67 09/16/2016   Current Outpatient Prescriptions on File  Prior to Visit  Medication Sig  . bisoprolol-hctz 10-6.25 TAKE ONE TAB ONCE DAILY  . CARTIA XT 180 MG 24 hr  Take 1 cap daily.  . FML ophth susp   . MULTI-VIT w/ MINERALS Take 1 tab daily  . NITROSTAT 0.4 MG SL  AS NEEDED FOR CHEST PAIN  . rosuvastatin 40 MG  TAKE ONE TAB ONCE DAILY  . VITAMIN D Take 5,000 Units  1 tablet daily  . XARELTO 20 MG  TAKE ONE TAB ONCE DAILY    Allergies  Allergen Reactions  . Zetia [Ezetimibe] myalgias   Past Medical History:  Diagnosis Date  . Bladder cancer ([place])   . Hearing loss of both ears   . History of colon polyps    BENIGN  . Hyperlipidemia   . Hypertension   . Mitral regurgitation   . OSA on CPAP    cpap settign of 13  . PAD (peripheral artery disease) (HCC)    ABI--  RIGHT SIDE NORMAL LEFT SIDE MODERATELY REDUCED W/ DISTAL LEFT SFA STENOSIS//  MILD CALDICATION  . PAF (paroxysmal atrial fibrillation) ([place])     a. s/p PVI 2014; b. s/p redo PVI and CTI 01/2015  . Pre-diabetes   . Vitamin D deficiency    Health Maintenance  Topic Date Due  . Hepatitis C Screening  06/21/2018 (Originally 1945-08-23)  .  COLONOSCOPY  04/22/2021  . TETANUS/TDAP  09/23/2023  . INFLUENZA VACCINE  Completed  . PNA vac Low Risk Adult  Completed   Immunization History  Administered Date(s) Administered  . DT 07/22/2003, 12/28/2013  . Influenza Whole 05/19/2013  . Influenza, High Dose Seasonal PF 04/08/2017  . Influenza,inj,Quad PF,6+ Mos 05/09/2014  . Influenza-Unspecified 05/14/2015, 05/01/2016  . Pneumococcal Conjugate-13 07/11/2014  . Pneumococcal Polysaccharide-23 12/04/2009, 06/10/2016  . Tdap 09/22/2013   Past Surgical History:  Procedure Laterality Date  . ABLATION OF DYSRHYTHMIC FOCUS  01/26/2015  . ATRIAL FIBRILLATION ABLATION N/A 07/27/2012   Procedure: ATRIAL FIBRILLATION ABLATION;  Surgeon: [name], MD;  Location: [facility] CATH LAB;  Service: Cardiovascular;  Laterality: N/A;  . CARDIAC ELECTROPHYSIOLOGY STUDY AND ABLATION  07-27-2012  DR  ALLRED   SUCCESSFUL ABLATION OF A-FIB  . CARDIOVERSION  08/06/2012   Procedure: CARDIOVERSION;  Surgeon: [name], MD;  Location: [facility] ENDOSCOPY;  Service: Cardiovascular;  Laterality: N/A;  . CARDIOVERSION N/A 08/25/2014   Procedure: CARDIOVERSION;  Surgeon: Larey Dresser, MD;  Location: [place];  Service: Cardiovascular;  Laterality: N/A;  . CARDIOVERSION N/A 11/13/2014   Procedure: CARDIOVERSION;  Surgeon: Sueanne Margarita, MD;  Location: [facility] ENDOSCOPY;  Service: Cardiovascular;  Laterality: N/A;  . CARDIOVERSION N/A 12/28/2014   Procedure: CARDIOVERSION;  Surgeon: [name], MD;  Location: [facility] ENDOSCOPY;  Service: Cardiovascular;  Laterality: N/A;  . CARDIOVERSION N/A 01/30/2017   Procedure: CARDIOVERSION;  Surgeon: Josue Hector, MD;  Location: [facility] ENDOSCOPY;  Service: Cardiovascular;  Laterality: N/A;  . CARDIOVERSION N/A 04/06/2017   Procedure: CARDIOVERSION;  Surgeon: Dorothy Spark, MD;  Location: [place];  Service: Cardiovascular;  Laterality: N/A;  . CATARACT EXTRACTION W/ INTRAOCULAR LENS  IMPLANT, BILATERAL    . COLONOSCOPY WITH PROPOFOL N/A 02/13/2015   Procedure: COLONOSCOPY WITH PROPOFOL;  Surgeon: Garlan Fair, MD;  Location: [facility] ENDOSCOPY;  Service: Endoscopy;  Laterality: N/A;  . CYSTOSCOPY W/ RETROGRADES Bilateral 04/12/2013   Procedure: CYSTOSCOPY WITH RETROGRADE PYELOGRAM;  Surgeon: [name], MD;  Location: [facility];  Service: Urology;  Laterality: Bilateral;  . ELECTROPHYSIOLOGIC STUDY N/A 01/26/2015   Procedure: Atrial Fibrillation Ablation;  Surgeon: [name], MD;  Location: [place] CV LAB;  Service: Cardiovascular;  Laterality: N/A;  . LEFT HEART CATHETERIZATION WITH CORONARY ANGIOGRAM N/A 11/24/2013   Procedure: LEFT HEART CATHETERIZATION WITH CORONARY ANGIOGRAM;  Surgeon: [name], MD;  Location: [facility] CATH LAB;  Service: Cardiovascular;  Laterality: N/A;  . PERCUTANEOUS CORONARY ROTOBLATOR INTERVENTION (PCI-R) N/A  11/28/2013   Procedure: PERCUTANEOUS CORONARY ROTOBLATOR INTERVENTION (PCI-R);  Surgeon: [name], MD;  Location: [facility] CATH LAB;  Service: Cardiovascular;  Laterality: N/A;  . PERCUTANEOUS CORONARY STENT INTERVENTION (PCI-S)  11/24/2013   Procedure: PERCUTANEOUS CORONARY STENT INTERVENTION (PCI-S);  Surgeon: [name], MD;  Location: [facility] CATH LAB;  Service: Cardiovascular;;  . TEE WITHOUT CARDIOVERSION  07/26/2012   Procedure: TRANSESOPHAGEAL ECHOCARDIOGRAM (TEE);  Surgeon: [name], MD;  Location: [facility] ENDOSCOPY;  Service: Cardiovascular;  Laterality: N/A;  . TEE WITHOUT CARDIOVERSION N/A 08/25/2014   Procedure: TRANSESOPHAGEAL ECHOCARDIOGRAM (TEE);  Surgeon: Larey Dresser, MD;  Location: [place];  Service: Cardiovascular;  Laterality: N/A;  . TEE WITHOUT CARDIOVERSION  01/26/2015   Procedure: Transesophageal Echocardiogram (Tee);  Surgeon: [name], MD;  Location: [place] CV LAB;  Service: Cardiovascular;;  . TONSILLECTOMY  age 27  . TOTAL HIP ARTHROPLASTY  07/25/2011   Procedure: TOTAL HIP ARTHROPLASTY ANTERIOR APPROACH;  Surgeon: Mcarthur Rossetti;  Location: [facility] ORS;  Service: Orthopedics;  Laterality: Right;  . TOTAL HIP ARTHROPLASTY Left 02-08-2010  . TRANSURETHRAL RESECTION OF BLADDER TUMOR WITH GYRUS (TURBT-GYRUS) N/A 04/12/2013   Procedure: TRANSURETHRAL RESECTION OF BLADDER TUMOR WITH GYRUS (TURBT-GYRUS);  Surgeon: [name], MD;  Location: [facility];  Service: Urology;  Laterality: N/A;   Family History  Problem Relation Age of Onset  . Heart attack Mother   . Heart disease Mother   . Heart failure Father   . Heart disease Father    Social History   Social History  . Marital status: Married    Spouse name: N/A  . Number of children: N/A  . Years of education: N/A   Occupational History  . Not on file.   Social History Main Topics  . Smoking status: Former Smoker    Packs/day: 2.00    Years: 10.00    Types:  Cigarettes    Quit date: 09/20/1981  . Smokeless tobacco: Never Used  . Alcohol use No     Comment: pt states he has stopped drinking alcohol  . Drug use: No  . Sexual activity: Not on file    ROS Constitutional: Denies fever, chills, weight loss/gain, headaches, insomnia,  night sweats or change in appetite. Does c/o fatigue. Eyes: Denies redness, blurred vision, diplopia, discharge, itchy or watery eyes.  ENT: Denies discharge, congestion, post nasal drip, epistaxis, sore throat, earache, hearing loss, dental pain, Tinnitus, Vertigo, Sinus pain or snoring.  Cardio: Denies chest pain, palpitations, irregular heartbeat, syncope, dyspnea, diaphoresis, orthopnea, PND, claudication or edema Respiratory: denies cough, dyspnea, DOE, pleurisy, hoarseness, laryngitis or wheezing.  Gastrointestinal: Denies dysphagia, heartburn, reflux, water brash, pain, cramps, nausea, vomiting, bloating, diarrhea, constipation, hematemesis, melena, hematochezia, jaundice or hemorrhoids Genitourinary: Denies dysuria, frequency, urgency, nocturia, hesitancy, discharge, hematuria or flank pain Musculoskeletal: Denies arthralgia, myalgia, stiffness, Jt. Swelling, pain, limp or strain/sprain. Denies Falls. Skin: Denies puritis, rash, hives, warts, acne, eczema or change in skin lesion Neuro: No weakness, tremor, incoordination, spasms, paresthesia or pain Psychiatric: Denies confusion, memory loss or sensory loss. Denies Depression. Endocrine: Denies change in weight, skin, hair change, nocturia, and paresthesia, diabetic polys, visual blurring or hyper / hypo glycemic episodes.  Heme/Lymph: No excessive bleeding, bruising or enlarged lymph nodes.  Physical Exam  BP 124/70   Pulse 64   Temp (!) 97.5 F (36.4 C)   Resp 18   Ht 5\' 9"  (1.753 m)   Wt 203 lb 12.8 oz (92.4 kg)   BMI 30.10 kg/m   General Appearance: Well nourished and well groomed and in no apparent distress.  Eyes: PERRLA, EOMs, conjunctiva no  swelling or erythema, normal fundi and vessels. Sinuses: No frontal/maxillary tenderness ENT/Mouth: EACs patent / TMs  nl. Nares clear without erythema, swelling, mucoid exudates. Oral hygiene is good. No erythema, swelling, or exudate. Tongue normal, non-obstructing. Tonsils not swollen or erythematous. Hearing normal.  Neck: Supple, thyroid normal. No bruits, nodes or JVD. Respiratory: Respiratory effort normal.  BS equal and clear bilateral without rales, rhonci, wheezing or stridor. Cardio: Heart sounds are normal with regular rate and rhythm and no murmurs, rubs or gallops. Peripheral pulses are normal and equal bilaterally without edema. No aortic or femoral bruits. Chest: symmetric with normal excursions and percussion.  Abdomen: Soft, with Nl bowel sounds. Nontender, no guarding, rebound, hernias, masses, or organomegaly.  Lymphatics: Non tender without lymphadenopathy.  Genitourinary: No hernias.Testes nl. DRE - prostate nl for age - smooth &  firm w/o nodules. Musculoskeletal: Full ROM all peripheral extremities, joint stability, 5/5 strength, and normal gait. Skin: Warm and dry without rashes, lesions, cyanosis, clubbing or  ecchymosis.  Neuro: Cranial nerves intact, reflexes equal bilaterally. Normal muscle tone, no cerebellar symptoms. Sensation intact.  Pysch: Alert and oriented X 3 with normal affect, insight and judgment appropriate.   Assessment and Plan  1. Annual Preventative/Screening Exam   2. Essential hypertension  - EKG 12-Lead - [place], RETROPERITNL ABD,  LTD - Urinalysis, Routine w reflex microscopic - Microalbumin / creatinine urine ratio - CBC with Differential/Platelet - BASIC METABOLIC PANEL WITH GFR - Magnesium - TSH  3. Hyperlipidemia, mixed  - EKG 12-Lead - [place], RETROPERITNL ABD,  LTD - Hepatic function panel - Lipid panel - TSH  4. Prediabetes  - EKG 12-Lead - [place], RETROPERITNL ABD,  LTD - Hemoglobin A1c - Insulin, random  5. Vitamin D  deficiency  - VITAMIN D 25 Hydroxy   6. Coronary artery disease involving native heart without angina pectoris, unspecified vessel or lesion type  - EKG 12-Lead - Lipid panel  7. Paroxysmal atrial fibrillation (HCC)  - EKG 12-Lead  8. Gastroesophageal reflux disease,  - CBC with Differential/Platelet  9. Obstructive sleep apnea syndrome   10. Screening for colorectal cancer  - POC Hemoccult Bld/Stl   11. Prostate cancer screening  - PSA  12. Screening for ischemic heart disease  - EKG 12-Lead  13. Screening for AAA (aortic abdominal aneurysm)  - [place], RETROPERITNL ABD,  LTD  14. Medication management  - Urinalysis, Routine w reflex microscopic - Microalbumin / creatinine urine ratio - CBC with Differential/Platelet - BASIC METABOLIC PANEL WITH GFR - Hepatic function panel - Magnesium - Lipid panel - TSH - Hemoglobin A1c - Insulin, random - VITAMIN D 25 Hydroxy        Patient was counseled in prudent diet, weight control to achieve/maintain BMI less than 25, BP monitoring, regular exercise and medications as discussed.  Discussed med effects and SE's. Routine screening labs and tests as requested with regular follow-up as recommended. Over 40 minutes of exam, counseling, chart review and high complex critical decision making was performed

## 2017-05-05 ENCOUNTER — Ambulatory Visit (INDEPENDENT_AMBULATORY_CARE_PROVIDER_SITE_OTHER): Payer: Medicare Other | Admitting: Internal Medicine

## 2017-05-05 VITALS — BP 124/70 | HR 64 | Temp 97.5°F | Resp 18 | Ht 69.0 in | Wt 203.8 lb

## 2017-05-05 DIAGNOSIS — F17200 Nicotine dependence, unspecified, uncomplicated: Secondary | ICD-10-CM | POA: Diagnosis not present

## 2017-05-05 DIAGNOSIS — K219 Gastro-esophageal reflux disease without esophagitis: Secondary | ICD-10-CM

## 2017-05-05 DIAGNOSIS — E782 Mixed hyperlipidemia: Secondary | ICD-10-CM

## 2017-05-05 DIAGNOSIS — Z0001 Encounter for general adult medical examination with abnormal findings: Secondary | ICD-10-CM

## 2017-05-05 DIAGNOSIS — G4733 Obstructive sleep apnea (adult) (pediatric): Secondary | ICD-10-CM

## 2017-05-05 DIAGNOSIS — R7303 Prediabetes: Secondary | ICD-10-CM

## 2017-05-05 DIAGNOSIS — I480 Paroxysmal atrial fibrillation: Secondary | ICD-10-CM

## 2017-05-05 DIAGNOSIS — R6889 Other general symptoms and signs: Secondary | ICD-10-CM | POA: Diagnosis not present

## 2017-05-05 DIAGNOSIS — I2510 Atherosclerotic heart disease of native coronary artery without angina pectoris: Secondary | ICD-10-CM | POA: Diagnosis not present

## 2017-05-05 DIAGNOSIS — E559 Vitamin D deficiency, unspecified: Secondary | ICD-10-CM

## 2017-05-05 DIAGNOSIS — Z1212 Encounter for screening for malignant neoplasm of rectum: Secondary | ICD-10-CM

## 2017-05-05 DIAGNOSIS — Z125 Encounter for screening for malignant neoplasm of prostate: Secondary | ICD-10-CM

## 2017-05-05 DIAGNOSIS — Z136 Encounter for screening for cardiovascular disorders: Secondary | ICD-10-CM

## 2017-05-05 DIAGNOSIS — I10 Essential (primary) hypertension: Secondary | ICD-10-CM | POA: Diagnosis not present

## 2017-05-05 DIAGNOSIS — Z79899 Other long term (current) drug therapy: Secondary | ICD-10-CM

## 2017-05-05 DIAGNOSIS — Z1211 Encounter for screening for malignant neoplasm of colon: Secondary | ICD-10-CM

## 2017-05-05 NOTE — Patient Instructions (Signed)

## 2017-05-06 LAB — HEPATIC FUNCTION PANEL
AG Ratio: 2 (calc) (ref 1.0–2.5)
ALBUMIN MSPROF: 4.3 g/dL (ref 3.6–5.1)
ALKALINE PHOSPHATASE (APISO): 52 U/L (ref 40–115)
ALT: 23 U/L (ref 9–46)
AST: 18 U/L (ref 10–35)
BILIRUBIN DIRECT: 0.3 mg/dL — AB (ref 0.0–0.2)
BILIRUBIN INDIRECT: 1.5 mg/dL — AB (ref 0.2–1.2)
Globulin: 2.2 g/dL (calc) (ref 1.9–3.7)
Total Bilirubin: 1.8 mg/dL — ABNORMAL HIGH (ref 0.2–1.2)
Total Protein: 6.5 g/dL (ref 6.1–8.1)

## 2017-05-06 LAB — CBC WITH DIFFERENTIAL/PLATELET
Basophils Absolute: 43 cells/uL (ref 0–200)
Basophils Relative: 0.7 %
Eosinophils Absolute: 122 cells/uL (ref 15–500)
Eosinophils Relative: 2 %
HCT: 43.6 % (ref 38.5–50.0)
Hemoglobin: 15.1 g/dL (ref 13.2–17.1)
Lymphs Abs: 1360 cells/uL (ref 850–3900)
MCH: 30.6 pg (ref 27.0–33.0)
MCHC: 34.6 g/dL (ref 32.0–36.0)
MCV: 88.4 fL (ref 80.0–100.0)
MPV: 9.6 fL (ref 7.5–12.5)
Monocytes Relative: 8.5 %
NEUTROS PCT: 66.5 %
Neutro Abs: 4057 cells/uL (ref 1500–7800)
PLATELETS: 213 10*3/uL (ref 140–400)
RBC: 4.93 10*6/uL (ref 4.20–5.80)
RDW: 12.5 % (ref 11.0–15.0)
TOTAL LYMPHOCYTE: 22.3 %
WBC: 6.1 10*3/uL (ref 3.8–10.8)
WBCMIX: 519 {cells}/uL (ref 200–950)

## 2017-05-06 LAB — BASIC METABOLIC PANEL WITH GFR
BUN: 14 mg/dL (ref 7–25)
CALCIUM: 9.5 mg/dL (ref 8.6–10.3)
CHLORIDE: 107 mmol/L (ref 98–110)
CO2: 28 mmol/L (ref 20–32)
Creat: 0.9 mg/dL (ref 0.70–1.18)
GFR, EST AFRICAN AMERICAN: 99 mL/min/{1.73_m2} (ref >=60)
GFR, Est Non African American: 85 mL/min/{1.73_m2} (ref >=60)
Glucose, Bld: 90 mg/dL (ref 65–99)
POTASSIUM: 4.4 mmol/L (ref 3.5–5.3)
Sodium: 141 mmol/L (ref 135–146)

## 2017-05-06 LAB — URINALYSIS, ROUTINE W REFLEX MICROSCOPIC
BILIRUBIN URINE: NEGATIVE
Glucose, UA: NEGATIVE
HGB URINE DIPSTICK: NEGATIVE
KETONES UR: NEGATIVE
Leukocytes, UA: NEGATIVE
NITRITE: NEGATIVE
PH: 7.5 (ref 5.0–8.0)
Protein, ur: NEGATIVE
SPECIFIC GRAVITY, URINE: 1.012 (ref 1.001–1.03)

## 2017-05-06 LAB — LIPID PANEL
Cholesterol: 161 mg/dL (ref <200)
HDL: 68 mg/dL (ref >40)
LDL Cholesterol (Calc): 79 mg/dL (calc)
Non-HDL Cholesterol (Calc): 93 mg/dL (calc) (ref <130)
Total CHOL/HDL Ratio: 2.4 (calc) (ref <5.0)
Triglycerides: 68 mg/dL (ref <150)

## 2017-05-06 LAB — VITAMIN D 25 HYDROXY (VIT D DEFICIENCY, FRACTURES): VIT D 25 HYDROXY: 71 ng/mL (ref 30–100)

## 2017-05-06 LAB — MICROALBUMIN / CREATININE URINE RATIO
Creatinine, Urine: 97 mg/dL (ref 20–320)
MICROALB UR: 0.3 mg/dL
Microalb Creat Ratio: 3 mcg/mg creat (ref <30)

## 2017-05-06 LAB — INSULIN, RANDOM: INSULIN: 6.1 u[IU]/mL (ref 2.0–19.6)

## 2017-05-06 LAB — HEMOGLOBIN A1C
EAG (MMOL/L): 5.8 (calc)
HEMOGLOBIN A1C: 5.3 %{Hb} (ref <5.7)
MEAN PLASMA GLUCOSE: 105 (calc)

## 2017-05-06 LAB — MAGNESIUM: MAGNESIUM: 2 mg/dL (ref 1.5–2.5)

## 2017-05-06 LAB — TSH: TSH: 0.73 m[IU]/L (ref 0.40–4.50)

## 2017-05-06 LAB — PSA: PSA: 0.4 ng/mL (ref <=4.0)

## 2017-05-11 ENCOUNTER — Encounter: Payer: Self-pay | Admitting: Internal Medicine

## 2017-05-11 ENCOUNTER — Ambulatory Visit (INDEPENDENT_AMBULATORY_CARE_PROVIDER_SITE_OTHER): Payer: Medicare Other | Admitting: Internal Medicine

## 2017-05-11 VITALS — BP 106/64 | HR 64 | Ht 69.5 in | Wt 209.6 lb

## 2017-05-11 DIAGNOSIS — I481 Persistent atrial fibrillation: Secondary | ICD-10-CM

## 2017-05-11 DIAGNOSIS — I10 Essential (primary) hypertension: Secondary | ICD-10-CM | POA: Diagnosis not present

## 2017-05-11 DIAGNOSIS — G4733 Obstructive sleep apnea (adult) (pediatric): Secondary | ICD-10-CM | POA: Diagnosis not present

## 2017-05-11 DIAGNOSIS — I4819 Other persistent atrial fibrillation: Secondary | ICD-10-CM

## 2017-05-11 NOTE — Patient Instructions (Signed)
Medication Instructions:  Your physician recommends that you continue on your current medications as directed. Please refer to the Current Medication list given to you today.  -- If you need a refill on your cardiac medications before your next appointment, please call your pharmacy. --  Labwork: None ordered  Testing/Procedures: None ordered  Follow-Up: Your physician wants you to follow-up in: 3 months with Dr. [name].     Thank you for choosing [facility]!!    [phone]  Any Other Special Instructions Will Be Listed Below (If Applicable).

## 2017-05-11 NOTE — Progress Notes (Signed)
PCP: [name], MD Primary Cardiologist: Dr Angelena Form Primary EP: Dr [name]  [name] is a 71 y.o. male who presents today for routine electrophysiology followup.  Since last being seen in our clinic, the patient reports doing reasonably well.  He has had 2 episodes of persistent afib (may and September) for which he required cardioversion.  He says that both episodes were brought on by vagal stimulation (eating too quickly).  He remains in sinus rhythm after his cardioversion in September.  No other concerns today.   Today, he denies symptoms of palpitations, chest pain, shortness of breath,  lower extremity edema, dizziness, presyncope, or syncope.  The patient is otherwise without complaint today.   Past Medical History:  Diagnosis Date  . Bladder cancer ([place])   . Hearing loss of both ears   . History of colon polyps    BENIGN  . Hyperlipidemia   . Hypertension   . Mitral regurgitation   . OSA on CPAP    cpap settign of 13  . PAD (peripheral artery disease) (HCC)    ABI--  RIGHT SIDE NORMAL LEFT SIDE MODERATELY REDUCED W/ DISTAL LEFT SFA STENOSIS//  MILD CALDICATION  . PAF (paroxysmal atrial fibrillation) ([place])     a. s/p PVI 2014; b. s/p redo PVI and CTI 01/2015  . Pre-diabetes   . Vitamin D deficiency    Past Surgical History:  Procedure Laterality Date  . ABLATION OF DYSRHYTHMIC FOCUS  01/26/2015  . ATRIAL FIBRILLATION ABLATION N/A 07/27/2012   Procedure: ATRIAL FIBRILLATION ABLATION;  Surgeon: [name], MD;  Location: [facility] CATH LAB;  Service: Cardiovascular;  Laterality: N/A;  . CARDIAC ELECTROPHYSIOLOGY STUDY AND ABLATION  07-27-2012  DR Chloeanne Poteet   SUCCESSFUL ABLATION OF A-FIB  . CARDIOVERSION  08/06/2012   Procedure: CARDIOVERSION;  Surgeon: [name], MD;  Location: [facility] ENDOSCOPY;  Service: Cardiovascular;  Laterality: N/A;  . CARDIOVERSION N/A 08/25/2014   Procedure: CARDIOVERSION;  Surgeon: Larey Dresser, MD;  Location: [place];  Service:  Cardiovascular;  Laterality: N/A;  . CARDIOVERSION N/A 11/13/2014   Procedure: CARDIOVERSION;  Surgeon: Sueanne Margarita, MD;  Location: [facility] ENDOSCOPY;  Service: Cardiovascular;  Laterality: N/A;  . CARDIOVERSION N/A 12/28/2014   Procedure: CARDIOVERSION;  Surgeon: [name], MD;  Location: [facility] ENDOSCOPY;  Service: Cardiovascular;  Laterality: N/A;  . CARDIOVERSION N/A 01/30/2017   Procedure: CARDIOVERSION;  Surgeon: Josue Hector, MD;  Location: [facility] ENDOSCOPY;  Service: Cardiovascular;  Laterality: N/A;  . CARDIOVERSION N/A 04/06/2017   Procedure: CARDIOVERSION;  Surgeon: Dorothy Spark, MD;  Location: [place];  Service: Cardiovascular;  Laterality: N/A;  . CATARACT EXTRACTION W/ INTRAOCULAR LENS  IMPLANT, BILATERAL    . COLONOSCOPY WITH PROPOFOL N/A 02/13/2015   Procedure: COLONOSCOPY WITH PROPOFOL;  Surgeon: Garlan Fair, MD;  Location: [facility] ENDOSCOPY;  Service: Endoscopy;  Laterality: N/A;  . CYSTOSCOPY W/ RETROGRADES Bilateral 04/12/2013   Procedure: CYSTOSCOPY WITH RETROGRADE PYELOGRAM;  Surgeon: [name], MD;  Location: [facility];  Service: Urology;  Laterality: Bilateral;  . ELECTROPHYSIOLOGIC STUDY N/A 01/26/2015   Procedure: Atrial Fibrillation Ablation;  Surgeon: [name], MD;  Location: [place] CV LAB;  Service: Cardiovascular;  Laterality: N/A;  . LEFT HEART CATHETERIZATION WITH CORONARY ANGIOGRAM N/A 11/24/2013   Procedure: LEFT HEART CATHETERIZATION WITH CORONARY ANGIOGRAM;  Surgeon: [name], MD;  Location: [facility] CATH LAB;  Service: Cardiovascular;  Laterality: N/A;  . PERCUTANEOUS CORONARY ROTOBLATOR INTERVENTION (PCI-R) N/A 11/28/2013   Procedure:  PERCUTANEOUS CORONARY ROTOBLATOR INTERVENTION (PCI-R);  Surgeon: [name], MD;  Location: [facility] CATH LAB;  Service: Cardiovascular;  Laterality: N/A;  . PERCUTANEOUS CORONARY STENT INTERVENTION (PCI-S)  11/24/2013   Procedure: PERCUTANEOUS CORONARY STENT INTERVENTION (PCI-S);  Surgeon:  [name], MD;  Location: [facility] CATH LAB;  Service: Cardiovascular;;  . TEE WITHOUT CARDIOVERSION  07/26/2012   Procedure: TRANSESOPHAGEAL ECHOCARDIOGRAM (TEE);  Surgeon: [name], MD;  Location: [facility] ENDOSCOPY;  Service: Cardiovascular;  Laterality: N/A;  . TEE WITHOUT CARDIOVERSION N/A 08/25/2014   Procedure: TRANSESOPHAGEAL ECHOCARDIOGRAM (TEE);  Surgeon: Larey Dresser, MD;  Location: [place];  Service: Cardiovascular;  Laterality: N/A;  . TEE WITHOUT CARDIOVERSION  01/26/2015   Procedure: Transesophageal Echocardiogram (Tee);  Surgeon: [name], MD;  Location: [place] CV LAB;  Service: Cardiovascular;;  . TONSILLECTOMY  age 43  . TOTAL HIP ARTHROPLASTY  07/25/2011   Procedure: TOTAL HIP ARTHROPLASTY ANTERIOR APPROACH;  Surgeon: Mcarthur Rossetti;  Location: [facility];  Service: Orthopedics;  Laterality: Right;  . TOTAL HIP ARTHROPLASTY Left 02-08-2010  . TRANSURETHRAL RESECTION OF BLADDER TUMOR WITH GYRUS (TURBT-GYRUS) N/A 04/12/2013   Procedure: TRANSURETHRAL RESECTION OF BLADDER TUMOR WITH GYRUS (TURBT-GYRUS);  Surgeon: [name], MD;  Location: [facility];  Service: Urology;  Laterality: N/A;    ROS- all systems are reviewed and negatives except as per HPI above  Current Outpatient Prescriptions  Medication Sig Dispense Refill  . bisoprolol-hydrochlorothiazide (ZIAC) 10-6.25 MG tablet TAKE ONE TABLET BY MOUTH ONCE DAILY 90 tablet 1  . CARTIA XT 180 MG 24 hr capsule Take 1 capsule (180 mg total) by mouth daily. 90 capsule 1  . fluorometholone (FML) 0.1 % ophthalmic suspension     . Multiple Vitamin (MULITIVITAMIN WITH MINERALS) TABS Take 1 tablet by mouth daily. FOR MEN    . NITROSTAT 0.4 MG SL tablet DISSOLVE ONE TABLET UNDER THE TONGUE EVERY 5 MINUTES AS NEEDED FOR CHEST PAIN.  DO NOT EXCEED A TOTAL OF 3 DOSES IN 15 MINUTES 25 tablet 6  . rosuvastatin (CRESTOR) 40 MG tablet TAKE ONE TABLET BY MOUTH ONCE DAILY 90 tablet 1  . VITAMIN D,  ERGOCALCIFEROL, PO Take 5,000 Units by mouth daily. Take 1 tablet daily    . XARELTO 20 MG TABS tablet TAKE ONE TABLET BY MOUTH ONCE DAILY WITH SUPPER 90 tablet 2   No current facility-administered medications for this visit.     Physical Exam: Vitals:   05/11/17 1211  Weight: 209 lb 9.6 oz (95.1 kg)  Height: 5' 9.5" (1.765 m)    GEN- The patient is well appearing, alert and oriented x 3 today.   Head- normocephalic, atraumatic Eyes-  Sclera clear, conjunctiva pink Ears- hearing intact Oropharynx- clear Lungs- Clear to ausculation bilaterally, normal work of breathing Heart- Regular rate and rhythm, no murmurs, rubs or gallops, PMI not laterally displaced GI- soft, NT, ND, + BS Extremities- no clubbing, cyanosis, or edema  EKG tracing ordered today is personally reviewed and shows sinus rhythm 64 bpm, otherwise normal ekg  Assessment and Plan:  1. Persistent atrial fibrillation 2 episodes since I saw him last, both vagally mediated Therapeutic strategies for afib including medicine and ablation were discussed in detail with the patient today. Risk, benefits, and alternatives to EP study and radiofrequency ablation for afib were also discussed in detail today.  At this point, he will try to avoid eating to fast and we will follow for recurrence.  If he has additional afib, I  have advised that he present to the AF clinic and we can schedule AF ablation directly at that time. If he remains in sinus, I will see him again in 6 months  2. CAD Stable No change required today  3. OSA Compliance with CPAP is encouraged  4. HTN Stable No change required today  Follow-up with Dr Angelena Form as scheduled I will see in 3 months He will call AF clinic with any afib in the interim  [name] MD, [facility] 05/11/2017 12:12 PM

## 2017-07-15 DIAGNOSIS — I739 Peripheral vascular disease, unspecified: Secondary | ICD-10-CM | POA: Insufficient documentation

## 2017-07-15 DIAGNOSIS — H9193 Unspecified hearing loss, bilateral: Secondary | ICD-10-CM | POA: Insufficient documentation

## 2017-07-15 DIAGNOSIS — Z860100 Personal history of colon polyps, unspecified: Secondary | ICD-10-CM | POA: Insufficient documentation

## 2017-07-15 DIAGNOSIS — Z9989 Dependence on other enabling machines and devices: Secondary | ICD-10-CM

## 2017-07-15 DIAGNOSIS — I480 Paroxysmal atrial fibrillation: Secondary | ICD-10-CM

## 2017-07-15 DIAGNOSIS — G4733 Obstructive sleep apnea (adult) (pediatric): Secondary | ICD-10-CM | POA: Insufficient documentation

## 2017-08-05 ENCOUNTER — Encounter: Payer: Self-pay | Admitting: Cardiovascular Disease

## 2017-08-05 ENCOUNTER — Ambulatory Visit (INDEPENDENT_AMBULATORY_CARE_PROVIDER_SITE_OTHER): Payer: Medicare Other | Admitting: Cardiovascular Disease

## 2017-08-05 VITALS — BP 128/78 | HR 61 | Ht 69.5 in | Wt 214.4 lb

## 2017-08-05 DIAGNOSIS — I2510 Atherosclerotic heart disease of native coronary artery without angina pectoris: Secondary | ICD-10-CM

## 2017-08-05 DIAGNOSIS — I4819 Other persistent atrial fibrillation: Secondary | ICD-10-CM

## 2017-08-05 DIAGNOSIS — I481 Persistent atrial fibrillation: Secondary | ICD-10-CM

## 2017-08-05 DIAGNOSIS — I10 Essential (primary) hypertension: Secondary | ICD-10-CM

## 2017-08-05 NOTE — Progress Notes (Signed)
Chief Complaint  Patient presents with  . Coronary Artery Disease   History of Present Illness: 72 yo male with history of CAD, HTN and paroxysmal atrial fibrillation who is here today for follow up of his CAD. His atrial fibrillation is followed by Dr. [name]. I performed a cardiac cath in May 2015 with rotational atherectomy of the proximal Circumflex followed by placement of a DES in the Circumflex. FFR of the LAD was not flow limiting. Nuclear stress test in September 2017 without evidence of ischemia.   He is here today for follow up. The patient denies any chest pain, dyspnea, palpitations, lower extremity edema, orthopnea, PND, dizziness, near syncope or syncope.   Primary Care Physician: [name], MD  Past Medical History:  Diagnosis Date  . Bladder cancer ([place])   . Hearing loss of both ears   . History of colon polyps    BENIGN  . Hyperlipidemia   . Hypertension   . Mitral regurgitation   . OSA on CPAP    cpap settign of 13  . PAD (peripheral artery disease) (H[facility])    ABI--  RIGHT SIDE NORMAL LEFT SIDE MODERATELY REDUCED W/ DISTAL LEFT SFA STENOSIS//  MILD CALDICATION  . PAF (paroxysmal atrial fibrillation) ([place])     a. s/p PVI 2014; b. s/p redo PVI and CTI 01/2015  . Pre-diabetes   . Vitamin D deficiency     Past Surgical History:  Procedure Laterality Date  . ABLATION OF DYSRHYTHMIC FOCUS  01/26/2015  . ATRIAL FIBRILLATION ABLATION N/A 07/27/2012   Procedure: ATRIAL FIBRILLATION ABLATION;  Surgeon: [name], MD;  Location: [facility] CATH LAB;  Service: Cardiovascular;  Laterality: N/A;  . CARDIAC ELECTROPHYSIOLOGY STUDY AND ABLATION  07-27-2012  DR ALLRED   SUCCESSFUL ABLATION OF A-FIB  . CARDIOVERSION  08/06/2012   Procedure: CARDIOVERSION;  Surgeon: [name], MD;  Location: [facility] ENDOSCOPY;  Service: Cardiovascular;  Laterality: N/A;  . CARDIOVERSION N/A 08/25/2014   Procedure: CARDIOVERSION;  Surgeon: Larey Dresser, MD;  Location: [place];  Service:  Cardiovascular;  Laterality: N/A;  . CARDIOVERSION N/A 11/13/2014   Procedure: CARDIOVERSION;  Surgeon: Sueanne Margarita, MD;  Location: [facility] ENDOSCOPY;  Service: Cardiovascular;  Laterality: N/A;  . CARDIOVERSION N/A 12/28/2014   Procedure: CARDIOVERSION;  Surgeon: [name], MD;  Location: [facility] ENDOSCOPY;  Service: Cardiovascular;  Laterality: N/A;  . CARDIOVERSION N/A 01/30/2017   Procedure: CARDIOVERSION;  Surgeon: Josue Hector, MD;  Location: [facility] ENDOSCOPY;  Service: Cardiovascular;  Laterality: N/A;  . CARDIOVERSION N/A 04/06/2017   Procedure: CARDIOVERSION;  Surgeon: Dorothy Spark, MD;  Location: [place];  Service: Cardiovascular;  Laterality: N/A;  . CATARACT EXTRACTION W/ INTRAOCULAR LENS  IMPLANT, BILATERAL    . COLONOSCOPY WITH PROPOFOL N/A 02/13/2015   Procedure: COLONOSCOPY WITH PROPOFOL;  Surgeon: Garlan Fair, MD;  Location: [facility] ENDOSCOPY;  Service: Endoscopy;  Laterality: N/A;  . CYSTOSCOPY W/ RETROGRADES Bilateral 04/12/2013   Procedure: CYSTOSCOPY WITH RETROGRADE PYELOGRAM;  Surgeon: [name], MD;  Location: [facility];  Service: Urology;  Laterality: Bilateral;  . ELECTROPHYSIOLOGIC STUDY N/A 01/26/2015   Procedure: Atrial Fibrillation Ablation;  Surgeon: [name], MD;  Location: [place] CV LAB;  Service: Cardiovascular;  Laterality: N/A;  . LEFT HEART CATHETERIZATION WITH CORONARY ANGIOGRAM N/A 11/24/2013   Procedure: LEFT HEART CATHETERIZATION WITH CORONARY ANGIOGRAM;  Surgeon: [name], MD;  Location: [facility] CATH LAB;  Service: Cardiovascular;  Laterality: N/A;  . PERCUTANEOUS CORONARY ROTOBLATOR INTERVENTION (  PCI-R) N/A 11/28/2013   Procedure: PERCUTANEOUS CORONARY ROTOBLATOR INTERVENTION (PCI-R);  Surgeon: [name], MD;  Location: [facility] CATH LAB;  Service: Cardiovascular;  Laterality: N/A;  . PERCUTANEOUS CORONARY STENT INTERVENTION (PCI-S)  11/24/2013   Procedure: PERCUTANEOUS CORONARY STENT INTERVENTION (PCI-S);  Surgeon:  [name], MD;  Location: [facility] CATH LAB;  Service: Cardiovascular;;  . TEE WITHOUT CARDIOVERSION  07/26/2012   Procedure: TRANSESOPHAGEAL ECHOCARDIOGRAM (TEE);  Surgeon: [name], MD;  Location: [facility] ENDOSCOPY;  Service: Cardiovascular;  Laterality: N/A;  . TEE WITHOUT CARDIOVERSION N/A 08/25/2014   Procedure: TRANSESOPHAGEAL ECHOCARDIOGRAM (TEE);  Surgeon: Larey Dresser, MD;  Location: [place];  Service: Cardiovascular;  Laterality: N/A;  . TEE WITHOUT CARDIOVERSION  01/26/2015   Procedure: Transesophageal Echocardiogram (Tee);  Surgeon: [name], MD;  Location: [place] CV LAB;  Service: Cardiovascular;;  . TONSILLECTOMY  age 3  . TOTAL HIP ARTHROPLASTY  07/25/2011   Procedure: TOTAL HIP ARTHROPLASTY ANTERIOR APPROACH;  Surgeon: Mcarthur Rossetti;  Location: [facility] ORS;  Service: Orthopedics;  Laterality: Right;  . TOTAL HIP ARTHROPLASTY Left 02-08-2010  . TRANSURETHRAL RESECTION OF BLADDER TUMOR WITH GYRUS (TURBT-GYRUS) N/A 04/12/2013   Procedure: TRANSURETHRAL RESECTION OF BLADDER TUMOR WITH GYRUS (TURBT-GYRUS);  Surgeon: [name], MD;  Location: [facility];  Service: Urology;  Laterality: N/A;    Current Outpatient Medications  Medication Sig Dispense Refill  . bisoprolol-hydrochlorothiazide (ZIAC) 10-6.25 MG tablet TAKE ONE TABLET BY MOUTH ONCE DAILY 90 tablet 1  . CARTIA XT 180 MG 24 hr capsule Take 1 capsule (180 mg total) by mouth daily. 90 capsule 1  . Multiple Vitamin (MULITIVITAMIN WITH MINERALS) TABS Take 1 tablet by mouth daily. FOR MEN    . NITROSTAT 0.4 MG SL tablet DISSOLVE ONE TABLET UNDER THE TONGUE EVERY 5 MINUTES AS NEEDED FOR CHEST PAIN.  DO NOT EXCEED A TOTAL OF 3 DOSES IN 15 MINUTES 25 tablet 6  . rosuvastatin (CRESTOR) 40 MG tablet TAKE ONE TABLET BY MOUTH ONCE DAILY 90 tablet 1  . VITAMIN D, ERGOCALCIFEROL, PO Take 5,000 Units by mouth daily. Take 1 tablet daily    . XARELTO 20 MG TABS tablet TAKE ONE TABLET BY MOUTH ONCE  DAILY WITH SUPPER 90 tablet 2   No current facility-administered medications for this visit.     Allergies  Allergen Reactions  . Zetia [Ezetimibe] Other (See Comments)    myalgias     Social History   Socioeconomic History  . Marital status: Married    Spouse name: Not on file  . Number of children: Not on file  . Years of education: Not on file  . Highest education level: Not on file  Social Needs  . Financial resource strain: Not on file  . Food insecurity - worry: Not on file  . Food insecurity - inability: Not on file  . Transportation needs - medical: Not on file  . Transportation needs - non-medical: Not on file  Occupational History  . Not on file  Tobacco Use  . Smoking status: Former Smoker    Packs/day: 2.00    Years: 10.00    Pack years: 20.00    Types: Cigarettes    Last attempt to quit: 09/20/1981    Years since quitting: 35.8  . Smokeless tobacco: Never Used  Substance and Sexual Activity  . Alcohol use: No    Comment: pt states he has stopped drinking alcohol  . Drug use: No  . Sexual activity: Not on  file  Other Topics Concern  . Not on file  Social History Narrative  . Not on file    Family History  Problem Relation Age of Onset  . Heart attack Mother   . Heart disease Mother   . Heart failure Father   . Heart disease Father     Review of Systems:  As stated in the HPI and otherwise negative.   BP 128/78   Pulse 61   Ht 5' 9.5" (1.765 m)   Wt 214 lb 6.4 oz (97.3 kg)   SpO2 95%   BMI 31.21 kg/m   Physical Examination:  General: Well developed, well nourished, NAD  HEENT: OP clear, mucus membranes moist  SKIN: warm, dry. No rashes. Neuro: No focal deficits  Musculoskeletal: Muscle strength 5/5 all ext  Psychiatric: Mood and affect normal  Neck: No JVD, no carotid bruits, no thyromegaly, no lymphadenopathy.  Lungs:Clear bilaterally, no wheezes, rhonci, crackles Cardiovascular: Regular rate and rhythm. No murmurs, gallops or  rubs. Abdomen:Soft. Bowel sounds present. Non-tender.  Extremities: No lower extremity edema. Pulses are 2 + in the bilateral DP/PT.  Cardiac cath 11/24/13: Angiographic Findings: Left main: Diffuse proximal and mid 20-30% stenosis.  Left Anterior Descending Artery: Large caliber vessel that courses to the apex. The vessel is heavily calcified in the proximal and mid segments. The mid vessel has a 50% heavily calcified stenosis. The distal vessel has diffuse plaque. The first diagonal branch is very small in caliber with ostial 50% stenosis. The second diagonal branch is moderate in caliber with mild plaque.  Circumflex Artery: Large vessel with heavy calcification in the proximal segment. There is a 99% proximal stenosis just beyond the ostium. The first OM branch is patent with mild plaque. The continuation of the AV groove Circumflex has a 40% stenosis.  Right Coronary Artery: Large dominant vessel with diffuse 20% stenosis, moderate proximal and mid calcification.  Left Ventricular Angiogram: LVEF=55-60%.   EKG:  EKG is not ordered today. The ekg ordered today demonstrates   Recent Labs: 05/05/2017: ALT 23; BUN 14; Creat 0.90; Hemoglobin 15.1; Magnesium 2.0; Platelets 213; Potassium 4.4; Sodium 141; TSH 0.73   Lipid Panel    Component Value Date/Time   CHOL 161 05/05/2017 1045   TRIG 68 05/05/2017 1045   HDL 68 05/05/2017 1045   CHOLHDL 2.4 05/05/2017 1045   VLDL 19 01/15/2017 0932   LDLCALC 72 01/15/2017 0932     Wt Readings from Last 3 Encounters:  08/05/17 214 lb 6.4 oz (97.3 kg)  05/11/17 209 lb 9.6 oz (95.1 kg)  05/05/17 203 lb 12.8 oz (92.4 kg)     Other studies Reviewed: Additional studies/ records that were reviewed today include:  Review of the above records demonstrates:    Assessment and Plan:   1. CAD without angina: No chest pain suggestive of angina. He is known to have CAD with prior stenting of the Circumflex and moderate calcified LAD stenosis with FFR  of 0.87 in May 2015. Nuclear stress test in 2017 without ischemia. His ASA and Plavix have been stopped since he is on Xarelto. Continue statin and beta blocker.    2. Atrial fibrillation, paroxysmal: He appears to be in sinus today. He is on a beta blocker, calcium channel blocker and Xarelto. He is followed by Dr. [name].   3. HTN: BP is well controlled. No changes today.   Current medicines are reviewed at length with the patient today.  The patient does not have concerns regarding medicines.  The following changes have been made:  no change  Labs/ tests ordered today include:   No orders of the defined types were placed in this encounter.    Disposition:   FU with me in 12  months  Signed, Lauree Chandler, MD 08/05/2017 9:25 AM    [facility] [place], [address], [place]  [address] Phone: [phone]; Fax: [phone]

## 2017-08-05 NOTE — Patient Instructions (Signed)

## 2017-08-12 ENCOUNTER — Encounter: Payer: Self-pay | Admitting: Internal Medicine

## 2017-08-12 ENCOUNTER — Other Ambulatory Visit: Payer: Self-pay | Admitting: Physician Assistant

## 2017-08-12 ENCOUNTER — Ambulatory Visit (INDEPENDENT_AMBULATORY_CARE_PROVIDER_SITE_OTHER): Payer: Medicare Other | Admitting: Internal Medicine

## 2017-08-12 VITALS — BP 132/76 | HR 55 | Ht 69.5 in | Wt 210.0 lb

## 2017-08-12 DIAGNOSIS — I4819 Other persistent atrial fibrillation: Secondary | ICD-10-CM

## 2017-08-12 DIAGNOSIS — I10 Essential (primary) hypertension: Secondary | ICD-10-CM

## 2017-08-12 DIAGNOSIS — I480 Paroxysmal atrial fibrillation: Secondary | ICD-10-CM | POA: Diagnosis not present

## 2017-08-12 DIAGNOSIS — I481 Persistent atrial fibrillation: Secondary | ICD-10-CM | POA: Diagnosis not present

## 2017-08-12 NOTE — Patient Instructions (Signed)
Medication Instructions:  Your physician recommends that you continue on your current medications as directed. Please refer to the Current Medication list given to you today.      Labwork: None Ordered   Testing/Procedures: None Ordered   Follow-Up: Your physician wants you to follow-up in: 6 months with Roderic Palau, NP You will receive a reminder letter in the mail two months in advance. If you don't receive a letter, please call our office to schedule the follow-up appointment.   Any Other Special Instructions Will Be Listed Below (If Applicable).     If you need a refill on your cardiac medications before your next appointment, please call your pharmacy.

## 2017-08-12 NOTE — Progress Notes (Signed)
PCP: [name], MD Primary Cardiologist: Dr Angelena Form Primary EP: Dr [name]  Damon [name] is a 72 y.o. male who presents today for routine electrophysiology followup.  Since last being seen in our clinic, the patient reports doing very well.   No afib since I saw him last.  Exercising without limitation. Today, he denies symptoms of palpitations, chest pain, shortness of breath,  lower extremity edema, dizziness, presyncope, or syncope.  The patient is otherwise without complaint today.   Past Medical History:  Diagnosis Date  . Bladder cancer ([place])   . Hearing loss of both ears   . History of colon polyps    BENIGN  . Hyperlipidemia   . Hypertension   . Mitral regurgitation   . OSA on CPAP    cpap settign of 13  . PAD (peripheral artery disease) ([facility])    ABI--  RIGHT SIDE NORMAL LEFT SIDE MODERATELY REDUCED W/ DISTAL LEFT SFA STENOSIS//  MILD CALDICATION  . PAF (paroxysmal atrial fibrillation) ([place])     a. s/p PVI 2014; b. s/p redo PVI and CTI 01/2015  . Pre-diabetes   . Vitamin D deficiency    Past Surgical History:  Procedure Laterality Date  . ABLATION OF DYSRHYTHMIC FOCUS  01/26/2015  . ATRIAL FIBRILLATION ABLATION N/A 07/27/2012   Procedure: ATRIAL FIBRILLATION ABLATION;  Surgeon: [name], MD;  Location: [facility] CATH LAB;  Service: Cardiovascular;  Laterality: N/A;  . CARDIAC ELECTROPHYSIOLOGY STUDY AND ABLATION  07-27-2012  DR Kessa Fairbairn   SUCCESSFUL ABLATION OF A-FIB  . CARDIOVERSION  08/06/2012   Procedure: CARDIOVERSION;  Surgeon: [name], MD;  Location: [facility] ENDOSCOPY;  Service: Cardiovascular;  Laterality: N/A;  . CARDIOVERSION N/A 08/25/2014   Procedure: CARDIOVERSION;  Surgeon: Larey Dresser, MD;  Location: [place];  Service: Cardiovascular;  Laterality: N/A;  . CARDIOVERSION N/A 11/13/2014   Procedure: CARDIOVERSION;  Surgeon: Sueanne Margarita, MD;  Location: [facility] ENDOSCOPY;  Service: Cardiovascular;  Laterality: N/A;  . CARDIOVERSION N/A 12/28/2014   Procedure: CARDIOVERSION;  Surgeon: [name], MD;  Location: [facility] ENDOSCOPY;  Service: Cardiovascular;  Laterality: N/A;  . CARDIOVERSION N/A 01/30/2017   Procedure: CARDIOVERSION;  Surgeon: Josue Hector, MD;  Location: [facility] ENDOSCOPY;  Service: Cardiovascular;  Laterality: N/A;  . CARDIOVERSION N/A 04/06/2017   Procedure: CARDIOVERSION;  Surgeon: Dorothy Spark, MD;  Location: [place];  Service: Cardiovascular;  Laterality: N/A;  . CATARACT EXTRACTION W/ INTRAOCULAR LENS  IMPLANT, BILATERAL    . COLONOSCOPY WITH PROPOFOL N/A 02/13/2015   Procedure: COLONOSCOPY WITH PROPOFOL;  Surgeon: Garlan Fair, MD;  Location: [facility] ENDOSCOPY;  Service: Endoscopy;  Laterality: N/A;  . CYSTOSCOPY W/ RETROGRADES Bilateral 04/12/2013   Procedure: CYSTOSCOPY WITH RETROGRADE PYELOGRAM;  Surgeon: [name], MD;  Location: [facility];  Service: Urology;  Laterality: Bilateral;  . ELECTROPHYSIOLOGIC STUDY N/A 01/26/2015   Procedure: Atrial Fibrillation Ablation;  Surgeon: [name], MD;  Location: [place] CV LAB;  Service: Cardiovascular;  Laterality: N/A;  . LEFT HEART CATHETERIZATION WITH CORONARY ANGIOGRAM N/A 11/24/2013   Procedure: LEFT HEART CATHETERIZATION WITH CORONARY ANGIOGRAM;  Surgeon: [name], MD;  Location: [facility] CATH LAB;  Service: Cardiovascular;  Laterality: N/A;  . PERCUTANEOUS CORONARY ROTOBLATOR INTERVENTION (PCI-R) N/A 11/28/2013   Procedure: PERCUTANEOUS CORONARY ROTOBLATOR INTERVENTION (PCI-R);  Surgeon: [name], MD;  Location: [facility] CATH LAB;  Service: Cardiovascular;  Laterality: N/A;  . PERCUTANEOUS CORONARY STENT INTERVENTION (PCI-S)  11/24/2013   Procedure: PERCUTANEOUS CORONARY STENT INTERVENTION (  PCI-S);  Surgeon: [name], MD;  Location: [facility] CATH LAB;  Service: Cardiovascular;;  . TEE WITHOUT CARDIOVERSION  07/26/2012   Procedure: TRANSESOPHAGEAL ECHOCARDIOGRAM (TEE);  Surgeon: [name], MD;  Location: [facility] ENDOSCOPY;   Service: Cardiovascular;  Laterality: N/A;  . TEE WITHOUT CARDIOVERSION N/A 08/25/2014   Procedure: TRANSESOPHAGEAL ECHOCARDIOGRAM (TEE);  Surgeon: Larey Dresser, MD;  Location: [place];  Service: Cardiovascular;  Laterality: N/A;  . TEE WITHOUT CARDIOVERSION  01/26/2015   Procedure: Transesophageal Echocardiogram (Tee);  Surgeon: [name], MD;  Location: [place] CV LAB;  Service: Cardiovascular;;  . TONSILLECTOMY  age 52  . TOTAL HIP ARTHROPLASTY  07/25/2011   Procedure: TOTAL HIP ARTHROPLASTY ANTERIOR APPROACH;  Surgeon: Mcarthur Rossetti;  Location: [facility];  Service: Orthopedics;  Laterality: Right;  . TOTAL HIP ARTHROPLASTY Left 02-08-2010  . TRANSURETHRAL RESECTION OF BLADDER TUMOR WITH GYRUS (TURBT-GYRUS) N/A 04/12/2013   Procedure: TRANSURETHRAL RESECTION OF BLADDER TUMOR WITH GYRUS (TURBT-GYRUS);  Surgeon: [name], MD;  Location: [facility];  Service: Urology;  Laterality: N/A;    ROS- all systems are reviewed and negatives except as per HPI above  Current Outpatient Medications  Medication Sig Dispense Refill  . bisoprolol-hydrochlorothiazide (ZIAC) 10-6.25 MG tablet TAKE ONE TABLET BY MOUTH ONCE DAILY 90 tablet 1  . CARTIA XT 180 MG 24 hr capsule Take 1 capsule (180 mg total) by mouth daily. 90 capsule 1  . Multiple Vitamin (MULITIVITAMIN WITH MINERALS) TABS Take 1 tablet by mouth daily. FOR MEN    . rosuvastatin (CRESTOR) 40 MG tablet TAKE ONE TABLET BY MOUTH ONCE DAILY 90 tablet 1  . VITAMIN D, ERGOCALCIFEROL, PO Take 5,000 Units by mouth daily. Take 1 tablet daily    . XARELTO 20 MG TABS tablet TAKE ONE TABLET BY MOUTH ONCE DAILY WITH SUPPER 90 tablet 2  . NITROSTAT 0.4 MG SL tablet DISSOLVE ONE TABLET UNDER THE TONGUE EVERY 5 MINUTES AS NEEDED FOR CHEST PAIN.  DO NOT EXCEED A TOTAL OF 3 DOSES IN 15 MINUTES (Patient not taking: Reported on 08/12/2017) 25 tablet 6   No current facility-administered medications for this visit.     Physical  Exam: Vitals:   08/12/17 0936  BP: 132/76  Pulse: (!) 55  Weight: 210 lb (95.3 kg)  Height: 5' 9.5" (1.765 m)    GEN- The patient is well appearing, alert and oriented x 3 today.   Head- normocephalic, atraumatic Eyes-  Sclera clear, conjunctiva pink Ears- hearing intact Oropharynx- clear Lungs- Clear to ausculation bilaterally, normal work of breathing Heart- Regular rate and rhythm, no murmurs, rubs or gallops, PMI not laterally displaced GI- soft, NT, ND, + BS Extremities- no clubbing, cyanosis, or edema  EKG tracing ordered today is personally reviewed and shows sinus rhythm 55 bpm  Assessment and Plan:  1. Vagally mediated persistent afib Well controlled On xarelto (chads2vasc score is at least 3)  2. CAD No ischemic symptoms  3. OSA Uses CPAP  4. HTN Stable No change required today  Return to see Butch Penny in the AF clinic in 6 months  [name] MD, [facility] 08/12/2017 10:38 AM

## 2017-08-15 ENCOUNTER — Other Ambulatory Visit (HOSPITAL_COMMUNITY): Payer: Self-pay | Admitting: Nurse Practitioner

## 2017-08-17 DIAGNOSIS — Z6829 Body mass index (BMI) 29.0-29.9, adult: Secondary | ICD-10-CM | POA: Insufficient documentation

## 2017-08-17 DIAGNOSIS — E663 Overweight: Secondary | ICD-10-CM | POA: Insufficient documentation

## 2017-08-17 DIAGNOSIS — E669 Obesity, unspecified: Secondary | ICD-10-CM

## 2017-08-17 NOTE — Progress Notes (Signed)
FOLLOW UP  Assessment and Plan:   Hypertension Well controlled with current medications  Monitor blood pressure at home; patient to call if consistently greater than 130/80 Continue DASH diet.   Reminder to go to the ER if any CP, SOB, nausea, dizziness, severe HA, changes vision/speech, left arm numbness and tingling and jaw pain.  Cholesterol Currently at goal; continue crestor 20 mg daily  Continue low cholesterol diet and exercise.  Check lipid panel.   Other abnormal glucose Recent A1Cs well controlled Continue diet and exercise.  Perform daily foot/skin check, notify office of any concerning changes.  Will check A1C secondary to admission of recent poor diet adherence  Obesity with co morbidities Long discussion about weight loss, diet, and exercise Recommended diet heavy in fruits and veggies and low in animal meats, cheeses, and dairy products, appropriate calorie intake Discussed ideal weight for height and weight goal (185 lb) Patient will work on reducing sugar intake - discussed at length and information provided regarding risks of excessive sugar intake Will follow up in 3 months  Vitamin D Def At goal at last visit; continue supplementation to maintain goal of 70-100 Defer Vit D level  Continue diet and meds as discussed. Further disposition pending results of labs. Discussed med's effects and SE's.   Over 30 minutes of exam, counseling, chart review, and critical decision making was performed.   Future Appointments  Date Time Provider [place]  12/08/2017  9:30 AM [name], MD [facility] None  06/10/2018 10:00 AM [name], MD [facility] None    ----------------------------------------------------------------------------------------------------------------------  HPI 72 y.o. male  presents for 3 month follow up on hypertension, cholesterol, glucose management, obesity and vitamin D deficiency. Patient has ASCAD  S/p PCA/stenting  followed by Rotator Ablation in 2015 (Dr Aundra Dubin).   Patient also has hx/o pAfib (on Xarelto) with RFA  x 2 in 2014/2016. He has had several episodes this past summer with successful conversion; has been discussing should he have another episode will convert and perform second ablation.   BMI is Body mass index is 30.42 kg/m., he has not been working on diet and exercise - related to holidays and has been recently going out for ice cream 3-4 times weekly with his wife.  Wt Readings from Last 3 Encounters:  08/18/17 209 lb (94.8 kg)  08/12/17 210 lb (95.3 kg)  08/05/17 214 lb 6.4 oz (97.3 kg)   His blood pressure has been controlled at home, today their BP is BP: 122/80  He does workout- walking [place] his [place] and golfing several times a week. He denies chest pain, shortness of breath, dizziness.   He is on cholesterol medication (currently taking crestor 20 mg daily) and denies myalgias. His cholesterol is at goal. The cholesterol last visit was:   Lab Results  Component Value Date   CHOL 161 05/05/2017   HDL 68 05/05/2017   LDLCALC 72 01/15/2017   TRIG 68 05/05/2017   CHOLHDL 2.4 05/05/2017    He has not been working on diet and exercise for glucose management, and denies increased appetite, nausea, paresthesia of the feet, polydipsia, polyuria, visual disturbances and vomiting. Last A1C in the office was:  Lab Results  Component Value Date   HGBA1C 5.3 05/05/2017   Patient is on Vitamin D supplement and at goal at recent check:   Lab Results  Component Value Date   VD25OH 71 05/05/2017        Current Medications:  Current Outpatient Medications on File Prior  to Visit  Medication Sig  . bisoprolol-hydrochlorothiazide (ZIAC) 10-6.25 MG tablet TAKE ONE TABLET BY MOUTH ONCE DAILY  . CARTIA XT 180 MG 24 hr capsule TAKE 1 CAPSULE BY MOUTH ONCE DAILY  . Multiple Vitamin (MULITIVITAMIN WITH MINERALS) TABS Take 1 tablet by mouth daily. FOR MEN  . NITROSTAT 0.4 MG SL tablet DISSOLVE ONE  TABLET UNDER THE TONGUE EVERY 5 MINUTES AS NEEDED FOR CHEST PAIN.  DO NOT EXCEED A TOTAL OF 3 DOSES IN 15 MINUTES  . rosuvastatin (CRESTOR) 40 MG tablet TAKE 1 TABLET BY MOUTH ONCE DAILY  . VITAMIN D, ERGOCALCIFEROL, PO Take 5,000 Units by mouth daily. Take 1 tablet daily  . XARELTO 20 MG TABS tablet TAKE ONE TABLET BY MOUTH ONCE DAILY WITH SUPPER   No current facility-administered medications on file prior to visit.      Allergies:  Allergies  Allergen Reactions  . Zetia [Ezetimibe] Other (See Comments)    myalgias      Medical History:  Past Medical History:  Diagnosis Date  . Bladder cancer ([place])   . Hearing loss of both ears   . History of colon polyps    BENIGN  . Hyperlipidemia   . Hypertension   . Mitral regurgitation   . OSA on CPAP    cpap settign of 13  . PAD (peripheral artery disease) ([facility])    ABI--  RIGHT SIDE NORMAL LEFT SIDE MODERATELY REDUCED W/ DISTAL LEFT SFA STENOSIS//  MILD CALDICATION  . PAF (paroxysmal atrial fibrillation) ([place])     a. s/p PVI 2014; b. s/p redo PVI and CTI 01/2015  . Pre-diabetes   . Vitamin D deficiency    Family history- Reviewed and unchanged Social history- Reviewed and unchanged   Review of Systems:  Review of Systems  Constitutional: Negative for malaise/fatigue and weight loss.  HENT: Negative for hearing loss and tinnitus.   Eyes: Negative for blurred vision and double vision.  Respiratory: Negative for cough, shortness of breath and wheezing.   Cardiovascular: Negative for chest pain, palpitations, orthopnea, claudication and leg swelling.  Gastrointestinal: Negative for abdominal pain, blood in stool, constipation, diarrhea, heartburn, melena, nausea and vomiting.  Genitourinary: Negative.   Musculoskeletal: Negative for joint pain and myalgias.  Skin: Negative for rash.  Neurological: Negative for dizziness, tingling, sensory change, weakness and headaches.  Endo/Heme/Allergies: Negative for polydipsia.   Psychiatric/Behavioral: Negative.   All other systems reviewed and are negative.     Physical Exam: BP 122/80   Pulse 70   Temp (!) 97.5 F (36.4 C)   Ht 5' 9.5" (1.765 m)   Wt 209 lb (94.8 kg)   SpO2 95%   BMI 30.42 kg/m  Wt Readings from Last 3 Encounters:  08/18/17 209 lb (94.8 kg)  08/12/17 210 lb (95.3 kg)  08/05/17 214 lb 6.4 oz (97.3 kg)   General Appearance: Well nourished, in no apparent distress. Eyes: PERRLA, EOMs, conjunctiva no swelling or erythema Sinuses: No Frontal/maxillary tenderness ENT/Mouth: Ext aud canals clear, TMs without erythema, bulging. No erythema, swelling, or exudate on post pharynx.  Tonsils not swollen or erythematous. Hearing normal.  Neck: Supple, thyroid normal.  Respiratory: Respiratory effort normal, BS equal bilaterally without rales, rhonchi, wheezing or stridor.  Cardio: RRR with no MRGs. Brisk peripheral pulses without edema.  Abdomen: Soft, + BS.  Non tender, no guarding, rebound, hernias, masses. Lymphatics: Non tender without lymphadenopathy.  Musculoskeletal: Full ROM, 5/5 strength, Normal gait Skin: Warm, dry without rashes, lesions, ecchymosis.  Neuro:  Cranial nerves intact. No cerebellar symptoms.  Psych: Awake and oriented X 3, normal affect, Insight and Judgment appropriate.    Izora Ribas, NP 4:22 PM [facility]

## 2017-08-17 NOTE — Progress Notes (Deleted)
FOLLOW UP  Assessment and Plan:   Paroxysmal A. Fib Rate controlled; No concerns with excessive bleeding, no falls Continue medication: xarelto  Hypertension Well controlled with current medications  Monitor blood pressure at home; patient to call if consistently greater than 130/80 Continue DASH diet.   Reminder to go to the ER if any CP, SOB, nausea, dizziness, severe HA, changes vision/speech, left arm numbness and tingling and jaw pain.  Cholesterol Currently at goal; continue statin medication Continue low cholesterol diet and exercise.  Check lipid panel.   Other abnormal glucose Intermittently elevated A1Cs - recently well controlled at goal Continue diet and exercise.  Perform daily foot/skin check, notify office of any concerning changes.  Defer A1C; check BMP  Obesity with co morbidities Long discussion about weight loss, diet, and exercise Recommended diet heavy in fruits and veggies and low in animal meats, cheeses, and dairy products, appropriate calorie intake Discussed ideal weight for height (below ***) and initial weight goal (***) Patient will work on *** Will follow up in 3 months  Vitamin D Def At goal at last visit; continue supplementation to maintain goal of 70-100 Defer Vit D level  Continue diet and meds as discussed. Further disposition pending results of labs. Discussed med's effects and SE's.   Over 30 minutes of exam, counseling, chart review, and critical decision making was performed.   Future Appointments  Date Time Provider [place]  08/18/2017  9:30 AM Liane Comber, NP [facility] None  12/08/2017  9:30 AM [name], MD [facility] None  06/10/2018 10:00 AM [name], MD [facility] None    ----------------------------------------------------------------------------------------------------------------------  HPI 72 y.o. male  presents for 3 month follow up on hypertension, cholesterol, glucose management, weight  and vitamin D deficiency. Patient has ASCAD  S/p PCA/stenting followed by Rotator Ablation in 2015 (Dr Aundra Dubin). Patient also has hx/o pAfib (on Xarelto) with RFA  x 2 in 2014/2016.  BMI is There is no height or weight on file to calculate BMI., he {HAS HAS YIR:48546} been working on diet and exercise. Wt Readings from Last 3 Encounters:  08/12/17 210 lb (95.3 kg)  08/05/17 214 lb 6.4 oz (97.3 kg)  05/11/17 209 lb 9.6 oz (95.1 kg)   His blood pressure {HAS HAS NOT:18834} been controlled at home, today their BP is    He {DOES_DOES EVO:35009} workout. He denies chest pain, shortness of breath, dizziness.   He is on cholesterol medication and denies myalgias. His cholesterol is at goal. The cholesterol last visit was:   Lab Results  Component Value Date   CHOL 161 05/05/2017   HDL 68 05/05/2017   LDLCALC 72 01/15/2017   TRIG 68 05/05/2017   CHOLHDL 2.4 05/05/2017    He {Has/has not:18111} been working on diet and exercise for glucose management (A1Cs periodically elevated into prediabetic range), and denies {Symptoms; diabetes w/o none:19199}. Last A1C in the office was:  Lab Results  Component Value Date   HGBA1C 5.3 05/05/2017   Patient is on Vitamin D supplement and at goal at most recent check:   Lab Results  Component Value Date   VD25OH 71 05/05/2017        Current Medications:  Current Outpatient Medications on File Prior to Visit  Medication Sig  . bisoprolol-hydrochlorothiazide (ZIAC) 10-6.25 MG tablet TAKE ONE TABLET BY MOUTH ONCE DAILY  . CARTIA XT 180 MG 24 hr capsule Take 1 capsule (180 mg total) by mouth daily.  . Multiple Vitamin (MULITIVITAMIN WITH MINERALS) TABS Take 1  tablet by mouth daily. FOR MEN  . NITROSTAT 0.4 MG SL tablet DISSOLVE ONE TABLET UNDER THE TONGUE EVERY 5 MINUTES AS NEEDED FOR CHEST PAIN.  DO NOT EXCEED A TOTAL OF 3 DOSES IN 15 MINUTES (Patient not taking: Reported on 08/12/2017)  . rosuvastatin (CRESTOR) 40 MG tablet TAKE 1 TABLET BY MOUTH ONCE  DAILY  . VITAMIN D, ERGOCALCIFEROL, PO Take 5,000 Units by mouth daily. Take 1 tablet daily  . XARELTO 20 MG TABS tablet TAKE ONE TABLET BY MOUTH ONCE DAILY WITH SUPPER   No current facility-administered medications on file prior to visit.      Allergies:  Allergies  Allergen Reactions  . Zetia [Ezetimibe] Other (See Comments)    myalgias      Medical History:  Past Medical History:  Diagnosis Date  . Bladder cancer ([place])   . Hearing loss of both ears   . History of colon polyps    BENIGN  . Hyperlipidemia   . Hypertension   . Mitral regurgitation   . OSA on CPAP    cpap settign of 13  . PAD (peripheral artery disease) ([facility])    ABI--  RIGHT SIDE NORMAL LEFT SIDE MODERATELY REDUCED W/ DISTAL LEFT SFA STENOSIS//  MILD CALDICATION  . PAF (paroxysmal atrial fibrillation) ([place])     a. s/p PVI 2014; b. s/p redo PVI and CTI 01/2015  . Pre-diabetes   . Vitamin D deficiency    Family history- Reviewed and unchanged Social history- Reviewed and unchanged   Review of Systems:  Review of Systems  Constitutional: Negative for malaise/fatigue and weight loss.  HENT: Negative for hearing loss and tinnitus.   Eyes: Negative for blurred vision and double vision.  Respiratory: Negative for cough, shortness of breath and wheezing.   Cardiovascular: Negative for chest pain, palpitations, orthopnea, claudication and leg swelling.  Gastrointestinal: Negative for abdominal pain, blood in stool, constipation, diarrhea, heartburn, melena, nausea and vomiting.  Genitourinary: Negative.   Musculoskeletal: Negative for joint pain and myalgias.  Skin: Negative for rash.  Neurological: Negative for dizziness, tingling, sensory change, weakness and headaches.  Endo/Heme/Allergies: Negative for polydipsia.  Psychiatric/Behavioral: Negative.   All other systems reviewed and are negative.     Physical Exam: There were no vitals taken for this visit. Wt Readings from Last 3 Encounters:   08/12/17 210 lb (95.3 kg)  08/05/17 214 lb 6.4 oz (97.3 kg)  05/11/17 209 lb 9.6 oz (95.1 kg)   General Appearance: Well nourished, in no apparent distress. Eyes: PERRLA, EOMs, conjunctiva no swelling or erythema Sinuses: No Frontal/maxillary tenderness ENT/Mouth: Ext aud canals clear, TMs without erythema, bulging. No erythema, swelling, or exudate on post pharynx.  Tonsils not swollen or erythematous. Hearing normal.  Neck: Supple, thyroid normal.  Respiratory: Respiratory effort normal, BS equal bilaterally without rales, rhonchi, wheezing or stridor.  Cardio: RRR with no MRGs. Brisk peripheral pulses without edema.  Abdomen: Soft, + BS.  Non tender, no guarding, rebound, hernias, masses. Lymphatics: Non tender without lymphadenopathy.  Musculoskeletal: Full ROM, 5/5 strength, {PSY - GAIT AND STATION:22860} gait Skin: Warm, dry without rashes, lesions, ecchymosis.  Neuro: Cranial nerves intact. No cerebellar symptoms.  Psych: Awake and oriented X 3, normal affect, Insight and Judgment appropriate.    Izora Ribas, NP 8:09 AM [facility] [place] [facility]

## 2017-08-18 ENCOUNTER — Encounter: Payer: Self-pay | Admitting: Adult Health

## 2017-08-18 ENCOUNTER — Ambulatory Visit: Payer: Self-pay | Admitting: Adult Health

## 2017-08-18 ENCOUNTER — Ambulatory Visit: Payer: Medicare Other | Admitting: Adult Health

## 2017-08-18 VITALS — BP 122/80 | HR 70 | Temp 97.5°F | Ht 69.5 in | Wt 209.0 lb

## 2017-08-18 DIAGNOSIS — E669 Obesity, unspecified: Secondary | ICD-10-CM | POA: Diagnosis not present

## 2017-08-18 DIAGNOSIS — I10 Essential (primary) hypertension: Secondary | ICD-10-CM

## 2017-08-18 DIAGNOSIS — R7309 Other abnormal glucose: Secondary | ICD-10-CM

## 2017-08-18 DIAGNOSIS — E782 Mixed hyperlipidemia: Secondary | ICD-10-CM | POA: Diagnosis not present

## 2017-08-18 DIAGNOSIS — Z79899 Other long term (current) drug therapy: Secondary | ICD-10-CM | POA: Diagnosis not present

## 2017-08-18 DIAGNOSIS — E559 Vitamin D deficiency, unspecified: Secondary | ICD-10-CM | POA: Diagnosis not present

## 2017-08-18 DIAGNOSIS — E66811 Obesity, class 1: Secondary | ICD-10-CM

## 2017-08-18 NOTE — Patient Instructions (Addendum)
Recommend cutting down on sugar intake  - the American Heart Association recommends no more than 9 teaspoons (38 g) of added sugar for men daily, and 6 teaspoons (25 g) for women. Added sugar can be in many things that you might not expect - salad dressings, bread that is not home made, "all natural" fruit juice, etc., most processed foods contain hidden sugars. Consider looking at labels and being aware of how much sugar you are consuming in a day. Less is always better for sugar; sugar reduces your body's immune response, and damages blood vessels, leading to increased risk of many diseases.    Diabetes is a very complicated disease...lets simplify it.  An easy way to look at it to understand the complications is if you think of the extra sugar floating in your blood stream as glass shards floating through your blood stream.    Diabetes affects your small vessels first: 1) The glass shards (sugar) scraps down the tiny blood vessels in your eyes and lead to diabetic retinopathy, the leading cause of blindness in the [place]. Diabetes is the leading cause of newly diagnosed adult (43 to 73 years of age) blindness in the [place].  2) The glass shards scratches down the tiny vessels of your legs leading to nerve damage called neuropathy and can lead to amputations of your feet. More than 60% of all non-traumatic amputations of lower limbs occur in people with diabetes.  3) Over time the small vessels in your brain are shredded and closed off, individually this does not cause any problems but over a long period of time many of the small vessels being blocked can lead to Vascular Dementia.   4) Your kidney's are a filter system and have a "net" that keeps certain things in the body and lets bad things out. Sugar shreds this net and leads to kidney damage and eventually failure. Decreasing the sugar that is destroying the net and certain blood pressure medications can help stop or decrease progression of  kidney disease. Diabetes was the primary cause of kidney failure in 44 percent of all new cases in 2011.  5) Diabetes also destroys the small vessels in your penis that lead to erectile dysfunction. Eventually the vessels are so damaged that you may not be responsive to cialis or viagra.   Diabetes and your large vessels: Your larger vessels consist of your coronary arteries in your heart and the carotid vessels to your brain. Diabetes or even increased sugars put you at 300% increased risk of heart attack and stroke and this is why.. The sugar scrapes down your large blood vessels and your body sees this as an internal injury and tries to repair itself. Just like you get a scab on your skin, your platelets will stick to the blood vessel wall trying to heal it. This is why we have diabetics on low dose aspirin daily, this prevents the platelets from sticking and can prevent plaque formation. In addition, your body takes cholesterol and tries to shove it into the open wound. This is why we want your LDL, or bad cholesterol, below 70.   The combination of platelets and cholesterol over 5-10 years forms plaque that can break off and cause a heart attack or stroke.   PLEASE REMEMBER:  Diabetes is preventable! Up to 74 percent of complications and morbidities among individuals with type 2 diabetes can be prevented, delayed, or effectively treated and minimized with regular visits to a health professional, appropriate monitoring and  medication, and a healthy diet and lifestyle.

## 2017-08-19 LAB — BASIC METABOLIC PANEL WITH GFR
BUN: 14 mg/dL (ref 7–25)
CHLORIDE: 108 mmol/L (ref 98–110)
CO2: 29 mmol/L (ref 20–32)
Calcium: 9.7 mg/dL (ref 8.6–10.3)
Creat: 0.92 mg/dL (ref 0.70–1.18)
GFR, Est African American: 96 mL/min/{1.73_m2} (ref >=60)
GFR, Est Non African American: 83 mL/min/{1.73_m2} (ref >=60)
GLUCOSE: 88 mg/dL (ref 65–99)
Potassium: 4.1 mmol/L (ref 3.5–5.3)
SODIUM: 143 mmol/L (ref 135–146)

## 2017-08-19 LAB — CBC WITH DIFFERENTIAL/PLATELET
BASOS ABS: 52 {cells}/uL (ref 0–200)
Basophils Relative: 0.9 %
EOS ABS: 220 {cells}/uL (ref 15–500)
Eosinophils Relative: 3.8 %
HCT: 44.6 % (ref 38.5–50.0)
Hemoglobin: 15.6 g/dL (ref 13.2–17.1)
Lymphs Abs: 1612 cells/uL (ref 850–3900)
MCH: 30.4 pg (ref 27.0–33.0)
MCHC: 35 g/dL (ref 32.0–36.0)
MCV: 86.8 fL (ref 80.0–100.0)
MONOS PCT: 8.7 %
MPV: 10.1 fL (ref 7.5–12.5)
Neutro Abs: 3410 cells/uL (ref 1500–7800)
Neutrophils Relative %: 58.8 %
PLATELETS: 180 10*3/uL (ref 140–400)
RBC: 5.14 10*6/uL (ref 4.20–5.80)
RDW: 11.7 % (ref 11.0–15.0)
TOTAL LYMPHOCYTE: 27.8 %
WBC mixed population: 505 cells/uL (ref 200–950)
WBC: 5.8 10*3/uL (ref 3.8–10.8)

## 2017-08-19 LAB — HEPATIC FUNCTION PANEL
AG RATIO: 1.8 (calc) (ref 1.0–2.5)
ALKALINE PHOSPHATASE (APISO): 53 U/L (ref 40–115)
ALT: 21 U/L (ref 9–46)
AST: 18 U/L (ref 10–35)
Albumin: 4.2 g/dL (ref 3.6–5.1)
BILIRUBIN INDIRECT: 1.2 mg/dL (ref 0.2–1.2)
Bilirubin, Direct: 0.2 mg/dL (ref 0.0–0.2)
Globulin: 2.4 g/dL (calc) (ref 1.9–3.7)
TOTAL PROTEIN: 6.6 g/dL (ref 6.1–8.1)
Total Bilirubin: 1.4 mg/dL — ABNORMAL HIGH (ref 0.2–1.2)

## 2017-08-19 LAB — LIPID PANEL
Cholesterol: 165 mg/dL (ref <200)
HDL: 61 mg/dL (ref >40)
LDL Cholesterol (Calc): 84 mg/dL (calc)
Non-HDL Cholesterol (Calc): 104 mg/dL (calc) (ref <130)
Total CHOL/HDL Ratio: 2.7 (calc) (ref <5.0)
Triglycerides: 102 mg/dL (ref <150)

## 2017-08-19 LAB — HEMOGLOBIN A1C
EAG (MMOL/L): 6 (calc)
Hgb A1c MFr Bld: 5.4 % of total Hgb (ref <5.7)
MEAN PLASMA GLUCOSE: 108 (calc)

## 2017-08-19 LAB — TSH: TSH: 0.64 mIU/L (ref 0.40–4.50)

## 2017-12-08 ENCOUNTER — Encounter: Payer: Self-pay | Admitting: Internal Medicine

## 2017-12-08 ENCOUNTER — Ambulatory Visit: Payer: Medicare Other | Admitting: Internal Medicine

## 2017-12-08 VITALS — BP 106/64 | HR 60 | Temp 97.5°F | Resp 16 | Ht 69.5 in | Wt 210.0 lb

## 2017-12-08 DIAGNOSIS — E782 Mixed hyperlipidemia: Secondary | ICD-10-CM

## 2017-12-08 DIAGNOSIS — Z79899 Other long term (current) drug therapy: Secondary | ICD-10-CM

## 2017-12-08 DIAGNOSIS — I480 Paroxysmal atrial fibrillation: Secondary | ICD-10-CM | POA: Diagnosis not present

## 2017-12-08 DIAGNOSIS — I2510 Atherosclerotic heart disease of native coronary artery without angina pectoris: Secondary | ICD-10-CM

## 2017-12-08 DIAGNOSIS — I10 Essential (primary) hypertension: Secondary | ICD-10-CM | POA: Diagnosis not present

## 2017-12-08 DIAGNOSIS — E559 Vitamin D deficiency, unspecified: Secondary | ICD-10-CM

## 2017-12-08 DIAGNOSIS — R7309 Other abnormal glucose: Secondary | ICD-10-CM | POA: Diagnosis not present

## 2017-12-08 NOTE — Patient Instructions (Signed)

## 2017-12-08 NOTE — Progress Notes (Signed)
This very nice 72 y.o. MWM presents for 6 month follow up with HTN, ASCAD, HLD, Pre-Diabetes and Vitamin D Deficiency. Patient is s/p TUR of Bladder Ca in 2014 (Dr Tresa Moore) and also is on CPAP for OSA with improved sleep hygiene.     Patient is treated for HTN (1996)  & BP has been controlled at home. Today's BP is at goal - 106/64. Patient had rot. atherotomy /DES  In 2015. Neg. Myoview in 2017. Also, patient is on Xarelto for hx/o  pAfib w/RFA x 2 (2014 & 2016) and last CV was in Sept 2018. Patient has had no complaints of any cardiac type chest pain, palpitations, dyspnea / orthopnea / PND, dizziness, claudication, or dependent edema.     Hyperlipidemia is controlled with diet & meds. Patient denies myalgias or other med SE's. Last Lipids were near goal: Lab Results  Component Value Date   CHOL 165 08/18/2017   HDL 61 08/18/2017   LDLCALC 84 08/18/2017   TRIG 102 08/18/2017   CHOLHDL 2.7 08/18/2017      Also, the patient has history of PreDiabetes (A1c 5.7%/2011 and 5.9%/2013) and he has had no symptoms of reactive hypoglycemia, diabetic polys, paresthesias or visual blurring.  Last A1c was Normal & at goal: Lab Results  Component Value Date   HGBA1C 5.4 08/18/2017      Further, the patient also has history of Vitamin D Deficiency ("27"/2008) and supplements vitamin D without any suspected side-effects. Last vitamin D was at goal: Lab Results  Component Value Date   VD25OH 71 05/05/2017   Current Outpatient Medications on File Prior to Visit  Medication Sig  . bisoprolol-hydrochlorothiazide (ZIAC) 10-6.25 MG tablet TAKE ONE TABLET BY MOUTH ONCE DAILY  . CARTIA XT 180 MG 24 hr capsule TAKE 1 CAPSULE BY MOUTH ONCE DAILY  . Cholecalciferol (VITAMIN D PO) Take 5,000 Units by mouth 2 (two) times daily.  . Multiple Vitamin (MULITIVITAMIN WITH MINERALS) TABS Take 1 tablet by mouth daily. FOR MEN  . NITROSTAT 0.4 MG SL tablet DISSOLVE ONE TABLET UNDER THE TONGUE EVERY 5 MINUTES AS NEEDED  FOR CHEST PAIN.  DO NOT EXCEED A TOTAL OF 3 DOSES IN 15 MINUTES  . rosuvastatin (CRESTOR) 40 MG tablet TAKE 1 TABLET BY MOUTH ONCE DAILY  . XARELTO 20 MG TABS tablet TAKE ONE TABLET BY MOUTH ONCE DAILY WITH SUPPER   No current facility-administered medications on file prior to visit.    Allergies  Allergen Reactions  . Zetia [Ezetimibe] Other (See Comments)    myalgias    PMHx:   Past Medical History:  Diagnosis Date  . Bladder cancer ([place])   . Hearing loss of both ears   . History of colon polyps    BENIGN  . Hyperlipidemia   . Hypertension   . Mitral regurgitation   . OSA on CPAP    cpap settign of 13  . PAD (peripheral artery disease) (HCC)    ABI--  RIGHT SIDE NORMAL LEFT SIDE MODERATELY REDUCED W/ DISTAL LEFT SFA STENOSIS//  MILD CALDICATION  . PAF (paroxysmal atrial fibrillation) ([place])     a. s/p PVI 2014; b. s/p redo PVI and CTI 01/2015  . Pre-diabetes   . Vitamin D deficiency    Immunization History  Administered Date(s) Administered  . DT 07/22/2003, 12/28/2013  . Influenza Whole 05/19/2013  . Influenza, High Dose Seasonal PF 04/08/2017  . Influenza,inj,Quad PF,6+ Mos 05/09/2014  . Influenza-Unspecified 05/14/2015, 05/01/2016  . Pneumococcal  Conjugate-13 07/11/2014  . Pneumococcal Polysaccharide-23 12/04/2009, 06/10/2016  . Tdap 09/22/2013   Past Surgical History:  Procedure Laterality Date  . ABLATION OF DYSRHYTHMIC FOCUS  01/26/2015  . ATRIAL FIBRILLATION ABLATION N/A 07/27/2012   Procedure: ATRIAL FIBRILLATION ABLATION;  Surgeon: [name], MD;  Location: [facility] CATH LAB;  Service: Cardiovascular;  Laterality: N/A;  . CARDIAC ELECTROPHYSIOLOGY STUDY AND ABLATION  07-27-2012  DR ALLRED   SUCCESSFUL ABLATION OF A-FIB  . CARDIOVERSION  08/06/2012   Procedure: CARDIOVERSION;  Surgeon: [name], MD;  Location: [facility] ENDOSCOPY;  Service: Cardiovascular;  Laterality: N/A;  . CARDIOVERSION N/A 08/25/2014   Procedure: CARDIOVERSION;  Surgeon: Larey Dresser, MD;   Location: [place];  Service: Cardiovascular;  Laterality: N/A;  . CARDIOVERSION N/A 11/13/2014   Procedure: CARDIOVERSION;  Surgeon: Sueanne Margarita, MD;  Location: [facility] ENDOSCOPY;  Service: Cardiovascular;  Laterality: N/A;  . CARDIOVERSION N/A 12/28/2014   Procedure: CARDIOVERSION;  Surgeon: [name], MD;  Location: [facility] ENDOSCOPY;  Service: Cardiovascular;  Laterality: N/A;  . CARDIOVERSION N/A 01/30/2017   Procedure: CARDIOVERSION;  Surgeon: Josue Hector, MD;  Location: [facility] ENDOSCOPY;  Service: Cardiovascular;  Laterality: N/A;  . CARDIOVERSION N/A 04/06/2017   Procedure: CARDIOVERSION;  Surgeon: Dorothy Spark, MD;  Location: [place];  Service: Cardiovascular;  Laterality: N/A;  . CATARACT EXTRACTION W/ INTRAOCULAR LENS  IMPLANT, BILATERAL    . COLONOSCOPY WITH PROPOFOL N/A 02/13/2015   Procedure: COLONOSCOPY WITH PROPOFOL;  Surgeon: Garlan Fair, MD;  Location: [facility] ENDOSCOPY;  Service: Endoscopy;  Laterality: N/A;  . CYSTOSCOPY W/ RETROGRADES Bilateral 04/12/2013   Procedure: CYSTOSCOPY WITH RETROGRADE PYELOGRAM;  Surgeon: [name], MD;  Location: [facility];  Service: Urology;  Laterality: Bilateral;  . ELECTROPHYSIOLOGIC STUDY N/A 01/26/2015   Procedure: Atrial Fibrillation Ablation;  Surgeon: [name], MD;  Location: [place] CV LAB;  Service: Cardiovascular;  Laterality: N/A;  . LEFT HEART CATHETERIZATION WITH CORONARY ANGIOGRAM N/A 11/24/2013   Procedure: LEFT HEART CATHETERIZATION WITH CORONARY ANGIOGRAM;  Surgeon: [name], MD;  Location: [facility] CATH LAB;  Service: Cardiovascular;  Laterality: N/A;  . PERCUTANEOUS CORONARY ROTOBLATOR INTERVENTION (PCI-R) N/A 11/28/2013   Procedure: PERCUTANEOUS CORONARY ROTOBLATOR INTERVENTION (PCI-R);  Surgeon: [name], MD;  Location: [facility] CATH LAB;  Service: Cardiovascular;  Laterality: N/A;  . PERCUTANEOUS CORONARY STENT INTERVENTION (PCI-S)  11/24/2013   Procedure: PERCUTANEOUS CORONARY  STENT INTERVENTION (PCI-S);  Surgeon: [name], MD;  Location: [facility] CATH LAB;  Service: Cardiovascular;;  . TEE WITHOUT CARDIOVERSION  07/26/2012   Procedure: TRANSESOPHAGEAL ECHOCARDIOGRAM (TEE);  Surgeon: [name], MD;  Location: [facility] ENDOSCOPY;  Service: Cardiovascular;  Laterality: N/A;  . TEE WITHOUT CARDIOVERSION N/A 08/25/2014   Procedure: TRANSESOPHAGEAL ECHOCARDIOGRAM (TEE);  Surgeon: Larey Dresser, MD;  Location: [place];  Service: Cardiovascular;  Laterality: N/A;  . TEE WITHOUT CARDIOVERSION  01/26/2015   Procedure: Transesophageal Echocardiogram (Tee);  Surgeon: [name], MD;  Location: [place] CV LAB;  Service: Cardiovascular;;  . TONSILLECTOMY  age 81  . TOTAL HIP ARTHROPLASTY  07/25/2011   Procedure: TOTAL HIP ARTHROPLASTY ANTERIOR APPROACH;  Surgeon: Mcarthur Rossetti;  Location: [facility] ORS;  Service: Orthopedics;  Laterality: Right;  . TOTAL HIP ARTHROPLASTY Left 02-08-2010  . TRANSURETHRAL RESECTION OF BLADDER TUMOR WITH GYRUS (TURBT-GYRUS) N/A 04/12/2013   Procedure: TRANSURETHRAL RESECTION OF BLADDER TUMOR WITH GYRUS (TURBT-GYRUS);  Surgeon: [name], MD;  Location: [facility];  Service: Urology;  Laterality: N/A;  FHx:    Reviewed / unchanged  SHx:    Reviewed / unchanged   Systems Review:  Constitutional: Denies fever, chills, wt changes, headaches, insomnia, fatigue, night sweats, change in appetite. Eyes: Denies redness, blurred vision, diplopia, discharge, itchy, watery eyes.  ENT: Denies discharge, congestion, post nasal drip, epistaxis, sore throat, earache, hearing loss, dental pain, tinnitus, vertigo, sinus pain, snoring.  CV: Denies chest pain, palpitations, irregular heartbeat, syncope, dyspnea, diaphoresis, orthopnea, PND, claudication or edema. Respiratory: denies cough, dyspnea, DOE, pleurisy, hoarseness, laryngitis, wheezing.  Gastrointestinal: Denies dysphagia, odynophagia, heartburn, reflux, water brash,  abdominal pain or cramps, nausea, vomiting, bloating, diarrhea, constipation, hematemesis, melena, hematochezia  or hemorrhoids. Genitourinary: Denies dysuria, frequency, urgency, nocturia, hesitancy, discharge, hematuria or flank pain. Musculoskeletal: Denies arthralgias, myalgias, stiffness, jt. swelling, pain, limping or strain/sprain.  Skin: Denies pruritus, rash, hives, warts, acne, eczema or change in skin lesion(s). Neuro: No weakness, tremor, incoordination, spasms, paresthesia or pain. Psychiatric: Denies confusion, memory loss or sensory loss. Endo: Denies change in weight, skin or hair change.  Heme/Lymph: No excessive bleeding, bruising or enlarged lymph nodes.  Physical Exam  BP 106/64   Pulse 60   Temp (!) 97.5 F (36.4 C)   Resp 16   Ht 5' 9.5" (1.765 m)   Wt 210 lb (95.3 kg)   BMI 30.57 kg/m   Appears  well nourished, well groomed  and in no distress.  Eyes: PERRLA, EOMs, conjunctiva no swelling or erythema. Sinuses: No frontal/maxillary tenderness ENT/Mouth: EAC's clear, TM's nl w/o erythema, bulging. Nares clear w/o erythema, swelling, exudates. Oropharynx clear without erythema or exudates. Oral hygiene is good. Tongue normal, non obstructing. Hearing intact.  Neck: Supple. Thyroid not palpable. Car 2+/2+ without bruits, nodes or JVD. Chest: Respirations nl with BS clear & equal w/o rales, rhonchi, wheezing or stridor.  Cor: Heart sounds normal w/ regular rate and rhythm without sig. murmurs, gallops, clicks or rubs. Peripheral pulses normal and equal  without edema.  Abdomen: Soft & bowel sounds normal. Non-tender w/o guarding, rebound, hernias, masses or organomegaly.  Lymphatics: Unremarkable.  Musculoskeletal: Full ROM all peripheral extremities, joint stability, 5/5 strength and normal gait.  Skin: Warm, dry without exposed rashes, lesions or ecchymosis apparent.  Neuro: Cranial nerves intact, reflexes equal bilaterally. Sensory-motor testing grossly intact.  Tendon reflexes grossly intact.  Pysch: Alert & oriented x 3.  Insight and judgement nl & appropriate. No ideations.  Assessment and Plan:  1. Essential hypertension  - Continue medication, monitor blood pressure at home.  - Continue DASH diet.  Reminder to go to the ER if any CP,  SOB, nausea, dizziness, severe HA, changes vision/speech.  - CBC with Differential/Platelet - COMPLETE METABOLIC PANEL WITH GFR - Magnesium - TSH  2. Hyperlipidemia, mixed  - Continue diet/meds, exercise,& lifestyle modifications.  - Continue monitor periodic cholesterol/liver & renal functions   - Lipid panel - TSH  3. Abnormal glucose  - Continue diet, exercise, lifestyle modifications.  - Monitor appropriate labs.  - Hemoglobin A1c - Insulin, random  4. Vitamin D deficiency  - Continue supplementation.   - VITAMIN D 25 Hydroxyl  5. Coronary artery disease involving native heart without angina pectoris  - Lipid panel  6. Paroxysmal atrial fibrillation (HCC)  - TSH  7. Medication management   - CBC with Differential/Platelet - COMPLETE METABOLIC PANEL WITH GFR - Magnesium - Lipid panel - TSH - Hemoglobin A1c - Insulin, random - VITAMIN D 25 Hydroxyl  Discussed  regular exercise, BP monitoring, weight control to achieve/maintain BMI less than 25 and discussed med and SE's. Recommended labs to assess and monitor clinical status with further disposition pending results of labs. Over 30 minutes of exam, counseling, chart review was performed.

## 2017-12-09 LAB — CBC WITH DIFFERENTIAL/PLATELET
BASOS PCT: 1.1 %
Basophils Absolute: 59 cells/uL (ref 0–200)
EOS PCT: 3 %
Eosinophils Absolute: 162 cells/uL (ref 15–500)
HEMATOCRIT: 45.5 % (ref 38.5–50.0)
Hemoglobin: 15.6 g/dL (ref 13.2–17.1)
LYMPHS ABS: 1318 {cells}/uL (ref 850–3900)
MCH: 29.8 pg (ref 27.0–33.0)
MCHC: 34.3 g/dL (ref 32.0–36.0)
MCV: 86.8 fL (ref 80.0–100.0)
MPV: 9.7 fL (ref 7.5–12.5)
Monocytes Relative: 10.5 %
NEUTROS PCT: 61 %
Neutro Abs: 3294 cells/uL (ref 1500–7800)
PLATELETS: 188 10*3/uL (ref 140–400)
RBC: 5.24 10*6/uL (ref 4.20–5.80)
RDW: 12.5 % (ref 11.0–15.0)
TOTAL LYMPHOCYTE: 24.4 %
WBC: 5.4 10*3/uL (ref 3.8–10.8)
WBCMIX: 567 {cells}/uL (ref 200–950)

## 2017-12-09 LAB — COMPLETE METABOLIC PANEL WITH GFR
AG RATIO: 1.8 (calc) (ref 1.0–2.5)
ALBUMIN MSPROF: 4.3 g/dL (ref 3.6–5.1)
ALKALINE PHOSPHATASE (APISO): 46 U/L (ref 40–115)
ALT: 19 U/L (ref 9–46)
AST: 22 U/L (ref 10–35)
BILIRUBIN TOTAL: 1.7 mg/dL — AB (ref 0.2–1.2)
BUN: 18 mg/dL (ref 7–25)
CO2: 29 mmol/L (ref 20–32)
Calcium: 9.5 mg/dL (ref 8.6–10.3)
Chloride: 107 mmol/L (ref 98–110)
Creat: 0.91 mg/dL (ref 0.70–1.18)
GFR, EST AFRICAN AMERICAN: 97 mL/min/{1.73_m2} (ref >=60)
GFR, Est Non African American: 84 mL/min/{1.73_m2} (ref >=60)
GLUCOSE: 96 mg/dL (ref 65–99)
Globulin: 2.4 g/dL (calc) (ref 1.9–3.7)
POTASSIUM: 3.9 mmol/L (ref 3.5–5.3)
SODIUM: 142 mmol/L (ref 135–146)
Total Protein: 6.7 g/dL (ref 6.1–8.1)

## 2017-12-09 LAB — TSH: TSH: 1.03 mIU/L (ref 0.40–4.50)

## 2017-12-09 LAB — HEMOGLOBIN A1C
HEMOGLOBIN A1C: 5.4 %{Hb} (ref <5.7)
MEAN PLASMA GLUCOSE: 108 (calc)
eAG (mmol/L): 6 (calc)

## 2017-12-09 LAB — INSULIN, RANDOM: INSULIN: 7.5 u[IU]/mL (ref 2.0–19.6)

## 2017-12-09 LAB — VITAMIN D 25 HYDROXY (VIT D DEFICIENCY, FRACTURES): VIT D 25 HYDROXY: 86 ng/mL (ref 30–100)

## 2017-12-09 LAB — LIPID PANEL
Cholesterol: 170 mg/dL (ref <200)
HDL: 60 mg/dL (ref >40)
LDL Cholesterol (Calc): 92 mg/dL (calc)
NON-HDL CHOLESTEROL (CALC): 110 mg/dL (ref <130)
Total CHOL/HDL Ratio: 2.8 (calc) (ref <5.0)
Triglycerides: 88 mg/dL (ref <150)

## 2017-12-09 LAB — MAGNESIUM: MAGNESIUM: 2.2 mg/dL (ref 1.5–2.5)

## 2017-12-28 ENCOUNTER — Other Ambulatory Visit: Payer: Self-pay | Admitting: Internal Medicine

## 2018-01-18 ENCOUNTER — Other Ambulatory Visit (HOSPITAL_COMMUNITY): Payer: Self-pay | Admitting: *Deleted

## 2018-01-18 MED ORDER — RIVAROXABAN 20 MG PO TABS: ORAL_TABLET | ORAL | 0 refills | Status: DC

## 2018-02-03 ENCOUNTER — Other Ambulatory Visit: Payer: Self-pay | Admitting: Internal Medicine

## 2018-02-04 ENCOUNTER — Ambulatory Visit (HOSPITAL_COMMUNITY)
Admission: RE | Admit: 2018-02-04 | Discharge: 2018-02-04 | Disposition: A | Discharge status: Home / Self Care | Payer: Medicare Other | Source: Ambulatory Visit | Attending: Nurse Practitioner | Admitting: Nurse Practitioner

## 2018-02-04 ENCOUNTER — Encounter (HOSPITAL_COMMUNITY): Payer: Self-pay | Admitting: Nurse Practitioner

## 2018-02-04 VITALS — BP 144/78 | HR 60 | Ht 69.5 in | Wt 210.6 lb

## 2018-02-04 DIAGNOSIS — Z7901 Long term (current) use of anticoagulants: Secondary | ICD-10-CM | POA: Diagnosis not present

## 2018-02-04 DIAGNOSIS — Z955 Presence of coronary angioplasty implant and graft: Secondary | ICD-10-CM | POA: Insufficient documentation

## 2018-02-04 DIAGNOSIS — Z8249 Family history of ischemic heart disease and other diseases of the circulatory system: Secondary | ICD-10-CM | POA: Diagnosis not present

## 2018-02-04 DIAGNOSIS — Z79899 Other long term (current) drug therapy: Secondary | ICD-10-CM | POA: Diagnosis not present

## 2018-02-04 DIAGNOSIS — I739 Peripheral vascular disease, unspecified: Secondary | ICD-10-CM | POA: Diagnosis not present

## 2018-02-04 DIAGNOSIS — I480 Paroxysmal atrial fibrillation: Secondary | ICD-10-CM | POA: Diagnosis not present

## 2018-02-04 DIAGNOSIS — R7303 Prediabetes: Secondary | ICD-10-CM | POA: Diagnosis not present

## 2018-02-04 DIAGNOSIS — Z961 Presence of intraocular lens: Secondary | ICD-10-CM | POA: Diagnosis not present

## 2018-02-04 DIAGNOSIS — Z86010 Personal history of colonic polyps: Secondary | ICD-10-CM | POA: Diagnosis not present

## 2018-02-04 DIAGNOSIS — H9193 Unspecified hearing loss, bilateral: Secondary | ICD-10-CM | POA: Insufficient documentation

## 2018-02-04 DIAGNOSIS — Z8551 Personal history of malignant neoplasm of bladder: Secondary | ICD-10-CM | POA: Diagnosis not present

## 2018-02-04 DIAGNOSIS — I4891 Unspecified atrial fibrillation: Secondary | ICD-10-CM | POA: Diagnosis present

## 2018-02-04 DIAGNOSIS — Z888 Allergy status to other drugs, medicaments and biological substances status: Secondary | ICD-10-CM | POA: Insufficient documentation

## 2018-02-04 DIAGNOSIS — G4733 Obstructive sleep apnea (adult) (pediatric): Secondary | ICD-10-CM | POA: Insufficient documentation

## 2018-02-04 DIAGNOSIS — E785 Hyperlipidemia, unspecified: Secondary | ICD-10-CM | POA: Insufficient documentation

## 2018-02-04 DIAGNOSIS — Z9842 Cataract extraction status, left eye: Secondary | ICD-10-CM | POA: Diagnosis not present

## 2018-02-04 DIAGNOSIS — Z96643 Presence of artificial hip joint, bilateral: Secondary | ICD-10-CM | POA: Diagnosis not present

## 2018-02-04 DIAGNOSIS — E559 Vitamin D deficiency, unspecified: Secondary | ICD-10-CM | POA: Insufficient documentation

## 2018-02-04 DIAGNOSIS — Z87891 Personal history of nicotine dependence: Secondary | ICD-10-CM | POA: Insufficient documentation

## 2018-02-04 DIAGNOSIS — Z9841 Cataract extraction status, right eye: Secondary | ICD-10-CM | POA: Insufficient documentation

## 2018-02-04 DIAGNOSIS — I10 Essential (primary) hypertension: Secondary | ICD-10-CM | POA: Insufficient documentation

## 2018-02-04 NOTE — Progress Notes (Signed)
Primary Care Physician: [name], MD Referring Physician: Dr. [name] is a 72 y.o. male with a h/o  afib s/p ablation x 2, 2014, 2016 and h/o prior multiple cardioversion's. He is being seen in the afib clinic. He reports no further afib.  He required cardioversion x 2  last year. No bleeding issues with xarelto, being compliant . He saw Dr. [name] months ago and it was decided that if afib burden increased, he would go ahead with another ablation.  Today, he denies symptoms of palpitations, chest pain, shortness of breath, orthopnea, PND, lower extremity edema, dizziness, presyncope, syncope, or neurologic sequela. The patient is tolerating medications without difficulties and is otherwise without complaint today.   Past Medical History:  Diagnosis Date  . Bladder cancer ([place])   . Hearing loss of both ears   . History of colon polyps    BENIGN  . Hyperlipidemia   . Hypertension   . Mitral regurgitation   . OSA on CPAP    cpap settign of 13  . PAD (peripheral artery disease) (HCC)    ABI--  RIGHT SIDE NORMAL LEFT SIDE MODERATELY REDUCED W/ DISTAL LEFT SFA STENOSIS//  MILD CALDICATION  . PAF (paroxysmal atrial fibrillation) ([place])     a. s/p PVI 2014; b. s/p redo PVI and CTI 01/2015  . Pre-diabetes   . Vitamin D deficiency    Past Surgical History:  Procedure Laterality Date  . ABLATION OF DYSRHYTHMIC FOCUS  01/26/2015  . ATRIAL FIBRILLATION ABLATION N/A 07/27/2012   Procedure: ATRIAL FIBRILLATION ABLATION;  Surgeon: [name], MD;  Location: [facility] CATH LAB;  Service: Cardiovascular;  Laterality: N/A;  . CARDIAC ELECTROPHYSIOLOGY STUDY AND ABLATION  07-27-2012  DR ALLRED   SUCCESSFUL ABLATION OF A-FIB  . CARDIOVERSION  08/06/2012   Procedure: CARDIOVERSION;  Surgeon: [name], MD;  Location: [facility] ENDOSCOPY;  Service: Cardiovascular;  Laterality: N/A;  . CARDIOVERSION N/A 08/25/2014   Procedure: CARDIOVERSION;  Surgeon: Larey Dresser, MD;  Location: [place];  Service: Cardiovascular;  Laterality: N/A;  . CARDIOVERSION N/A 11/13/2014   Procedure: CARDIOVERSION;  Surgeon: Sueanne Margarita, MD;  Location: [facility] ENDOSCOPY;  Service: Cardiovascular;  Laterality: N/A;  . CARDIOVERSION N/A 12/28/2014   Procedure: CARDIOVERSION;  Surgeon: [name], MD;  Location: [facility] ENDOSCOPY;  Service: Cardiovascular;  Laterality: N/A;  . CARDIOVERSION N/A 01/30/2017   Procedure: CARDIOVERSION;  Surgeon: Josue Hector, MD;  Location: [facility] ENDOSCOPY;  Service: Cardiovascular;  Laterality: N/A;  . CARDIOVERSION N/A 04/06/2017   Procedure: CARDIOVERSION;  Surgeon: Dorothy Spark, MD;  Location: [place];  Service: Cardiovascular;  Laterality: N/A;  . CATARACT EXTRACTION W/ INTRAOCULAR LENS  IMPLANT, BILATERAL    . COLONOSCOPY WITH PROPOFOL N/A 02/13/2015   Procedure: COLONOSCOPY WITH PROPOFOL;  Surgeon: Garlan Fair, MD;  Location: [facility] ENDOSCOPY;  Service: Endoscopy;  Laterality: N/A;  . CYSTOSCOPY W/ RETROGRADES Bilateral 04/12/2013   Procedure: CYSTOSCOPY WITH RETROGRADE PYELOGRAM;  Surgeon: [name], MD;  Location: [facility];  Service: Urology;  Laterality: Bilateral;  . ELECTROPHYSIOLOGIC STUDY N/A 01/26/2015   Procedure: Atrial Fibrillation Ablation;  Surgeon: [name], MD;  Location: [place] CV LAB;  Service: Cardiovascular;  Laterality: N/A;  . LEFT HEART CATHETERIZATION WITH CORONARY ANGIOGRAM N/A 11/24/2013   Procedure: LEFT HEART CATHETERIZATION WITH CORONARY ANGIOGRAM;  Surgeon: [name], MD;  Location: [facility] CATH LAB;  Service: Cardiovascular;  Laterality: N/A;  . PERCUTANEOUS CORONARY ROTOBLATOR INTERVENTION (PCI-R)  N/A 11/28/2013   Procedure: PERCUTANEOUS CORONARY ROTOBLATOR INTERVENTION (PCI-R);  Surgeon: [name], MD;  Location: [facility] CATH LAB;  Service: Cardiovascular;  Laterality: N/A;  . PERCUTANEOUS CORONARY STENT INTERVENTION (PCI-S)  11/24/2013   Procedure: PERCUTANEOUS CORONARY STENT  INTERVENTION (PCI-S);  Surgeon: [name], MD;  Location: [facility] CATH LAB;  Service: Cardiovascular;;  . TEE WITHOUT CARDIOVERSION  07/26/2012   Procedure: TRANSESOPHAGEAL ECHOCARDIOGRAM (TEE);  Surgeon: [name], MD;  Location: [facility] ENDOSCOPY;  Service: Cardiovascular;  Laterality: N/A;  . TEE WITHOUT CARDIOVERSION N/A 08/25/2014   Procedure: TRANSESOPHAGEAL ECHOCARDIOGRAM (TEE);  Surgeon: Larey Dresser, MD;  Location: [place];  Service: Cardiovascular;  Laterality: N/A;  . TEE WITHOUT CARDIOVERSION  01/26/2015   Procedure: Transesophageal Echocardiogram (Tee);  Surgeon: [name], MD;  Location: [place] CV LAB;  Service: Cardiovascular;;  . TONSILLECTOMY  age 72  . TOTAL HIP ARTHROPLASTY  07/25/2011   Procedure: TOTAL HIP ARTHROPLASTY ANTERIOR APPROACH;  Surgeon: Mcarthur Rossetti;  Location: [facility];  Service: Orthopedics;  Laterality: Right;  . TOTAL HIP ARTHROPLASTY Left 02-08-2010  . TRANSURETHRAL RESECTION OF BLADDER TUMOR WITH GYRUS (TURBT-GYRUS) N/A 04/12/2013   Procedure: TRANSURETHRAL RESECTION OF BLADDER TUMOR WITH GYRUS (TURBT-GYRUS);  Surgeon: [name], MD;  Location: [facility];  Service: Urology;  Laterality: N/A;    Current Outpatient Medications  Medication Sig Dispense Refill  . ALPRAZolam (XANAX) 1 MG tablet Take 1/2 to 1 tablet ONLY if needed at hour of sleep 30 tablet 0  . bisoprolol-hydrochlorothiazide (ZIAC) 10-6.25 MG tablet TAKE 1 TABLET BY MOUTH ONCE DAILY 90 tablet 1  . CARTIA XT 180 MG 24 hr capsule TAKE 1 CAPSULE BY MOUTH ONCE DAILY 90 capsule 1  . Cholecalciferol (VITAMIN D PO) Take 5,000 Units by mouth 2 (two) times daily.    . Multiple Vitamin (MULITIVITAMIN WITH MINERALS) TABS Take 1 tablet by mouth daily. FOR MEN    . NITROSTAT 0.4 MG SL tablet DISSOLVE ONE TABLET UNDER THE TONGUE EVERY 5 MINUTES AS NEEDED FOR CHEST PAIN.  DO NOT EXCEED A TOTAL OF 3 DOSES IN 15 MINUTES 25 tablet 6  . rivaroxaban (XARELTO) 20 MG  TABS tablet TAKE ONE TABLET BY MOUTH ONCE DAILY WITH SUPPER 90 tablet 0  . rosuvastatin (CRESTOR) 40 MG tablet TAKE 1 TABLET BY MOUTH ONCE DAILY 90 tablet 1   No current facility-administered medications for this encounter.     Allergies  Allergen Reactions  . Zetia [Ezetimibe] Other (See Comments)    myalgias     Social History   Socioeconomic History  . Marital status: Married    Spouse name: Not on file  . Number of children: Not on file  . Years of education: Not on file  . Highest education level: Not on file  Occupational History  . Not on file  Social Needs  . Financial resource strain: Not on file  . Food insecurity:    Worry: Not on file    Inability: Not on file  . Transportation needs:    Medical: Not on file    Non-medical: Not on file  Tobacco Use  . Smoking status: Former Smoker    Packs/day: 2.00    Years: 10.00    Pack years: 20.00    Types: Cigarettes    Last attempt to quit: 09/20/1981    Years since quitting: 36.4  . Smokeless tobacco: Never Used  Substance and Sexual Activity  . Alcohol use: No  Comment: pt states he has stopped drinking alcohol  . Drug use: No  . Sexual activity: Not on file  Lifestyle  . Physical activity:    Days per week: Not on file    Minutes per session: Not on file  . Stress: Not on file  Relationships  . Social connections:    Talks on phone: Not on file    Gets together: Not on file    Attends religious service: Not on file    Active member of club or organization: Not on file    Attends meetings of clubs or organizations: Not on file    Relationship status: Not on file  . Intimate partner violence:    Fear of current or ex partner: Not on file    Emotionally abused: Not on file    Physically abused: Not on file    Forced sexual activity: Not on file  Other Topics Concern  . Not on file  Social History Narrative  . Not on file    Family History  Problem Relation Age of Onset  . Heart attack Mother     . Heart disease Mother   . Heart failure Father   . Heart disease Father     ROS- All systems are reviewed and negative except as per the HPI above  Physical Exam: Vitals:   02/04/18 0828  BP: (!) 144/78  Pulse: 60  SpO2: 97%  Weight: 210 lb 9.6 oz (95.5 kg)  Height: 5' 9.5" (1.765 m)   Wt Readings from Last 3 Encounters:  02/04/18 210 lb 9.6 oz (95.5 kg)  12/08/17 210 lb (95.3 kg)  08/18/17 209 lb (94.8 kg)    Labs: Lab Results  Component Value Date   NA 142 12/08/2017   K 3.9 12/08/2017   CL 107 12/08/2017   CO2 29 12/08/2017   GLUCOSE 96 12/08/2017   BUN 18 12/08/2017   CREATININE 0.91 12/08/2017   CALCIUM 9.5 12/08/2017   MG 2.2 12/08/2017   Lab Results  Component Value Date   INR 1.1 (H) 11/16/2013   Lab Results  Component Value Date   CHOL 170 12/08/2017   HDL 60 12/08/2017   LDLCALC 92 12/08/2017   TRIG 88 12/08/2017     GEN- The patient is well appearing, alert and oriented x 3 today.   Head- normocephalic, atraumatic Eyes-  Sclera clear, conjunctiva pink Ears- hearing intact Oropharynx- clear Neck- supple, no JVP Lymph- no cervical lymphadenopathy Lungs- Clear to ausculation bilaterally, normal work of breathing Heart-  regular rate and rhythm, no murmurs, rubs or gallops, PMI not laterally displaced GI- soft, NT, ND, + BS Extremities- no clubbing, cyanosis, or edema MS- no significant deformity or atrophy Skin- no rash or lesion Psych- euthymic mood, full affect Neuro- strength and sensation are intact  EKG- NSR at 60 bpm, pr int 166 ms, qrs int 74ms, qtc 438 ms Epic records reviewed  Assessment and Plan: 1. Paroxysmal afib Quiet currently If afib burden increases, he wants repeat ablation Continue xarelto, chadsvasc score of at least 2  Continue cardizem 180 mg daily   2. HTN Mildly elevated today States usually runs 308 systolic Avoid salt  F/u with Dr. [name] in 6 months afib clinic as needed   [place]. Amish Mintzer,  [facility] [address] [place], [place]  [id number] [phone]

## 2018-02-11 ENCOUNTER — Other Ambulatory Visit (HOSPITAL_COMMUNITY): Payer: Self-pay | Admitting: Nurse Practitioner

## 2018-03-23 DIAGNOSIS — G4700 Insomnia, unspecified: Secondary | ICD-10-CM | POA: Insufficient documentation

## 2018-03-23 NOTE — Progress Notes (Signed)
Patient ID: Damon [name], male   DOB: 01-Oct-1945, 72 y.o.   MRN: [id number]  MEDICARE ANNUAL WELLNESS VISIT AND OV  Assessment:    Encounter for Medicare annual wellness exam  Essential hypertension - continue medications, DASH diet, exercise and monitor at home. Call if greater than 130/80.  - CBC with Differential/Platelet - CMP/GFR - TSH   Paroxysmal atrial fibrillation ([place]) Continue follow up cardio Continue xarelto  CAD Control blood pressure, cholesterol, glucose, increase exercise.  Followed by cardiology No recent chest pain/nitroglycerine use   Hyperlipidemia -continue medications, check lipids, decrease fatty foods, increase activity.  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    Malignant neoplasm of urinary bladder, unspecified site [facility]) Continue close monitoring by urology  OSA on CPAP Sleep apnea- continue CPAP, weight loss advised.    Medication management - Magnesium   Mitral regurgitation Continue cardio follow up   Peripheral arterial disease (HCC) Control blood pressure, cholesterol, glucose, increase exercise.   Gastroesophageal reflux disease, esophagitis presence not specified Continue PPI/H2 blocker, diet discussed  Obesity Long discussion about weight loss, diet, and exercise Recommended diet heavy in fruits and veggies and low in animal meats, cheeses, and dairy products, appropriate calorie intake Discussed appropriate weight for height Follow up at next visit  Insomnia - good sleep hygiene discussed, increase day time activity, try melatonin or benadryl   Try to limit benzo use to <5 x/week  History of colon polyps UTD on colonoscopies; follows with [facility]  Bilateral hearing loss High frequency only, doesn't want hearing aids at this time  Suprapubic pain/tenderness UA was neg, has completed 10 days of levaquin, check PSA today, follow up urology if not improving  Future Appointments  Date Time Provider [place]  06/29/2018  3:45 PM [name], MD [facility] None     Plan:   During the course of the visit the patient was educated and counseled about appropriate screening and preventive services including:    Pneumococcal vaccine   Influenza vaccine  Td vaccine  Screening electrocardiogram  Bone densitometry screening  Colorectal cancer screening  Diabetes screening  Glaucoma screening  Nutrition counseling   Advanced directives: requested  Subjective:   Damon [name]  presents for Medicare Annual Wellness Visit and 3 month follow up for Hypertension, Hyperlipidemia, Pre-Diabetes and Vitamin D Deficiency.   Patient is prescribed xanax which he takes primarily for insomnia; he currently takes 1/4-1/2 tabs most evenings.   In Sept 2014 , Patient had TUR of a Bladder cancer by Dr Tresa Moore, follows regularly, saw recently. He has been having intermittent suprapubic pain, retroscrotal pain, UA by urology was reportedly negative. He denies pain with BM or changes in bowel character. He did complete 10 day course of levaquin for ?epididymitis by urology but reports ongoing intermittent pain, 4/10. Denies urinary symptoms, fever/chills, changes in urine character. Pain is worse with sitting up, particularly in car with seatbelt.   BMI is Body mass index is 30.42 kg/m., he has been and exercise. He has OSA and is on a CPAP. Still eating ice cream daily.  Wt Readings from Last 3 Encounters:  03/25/18 209 lb (94.8 kg)  02/04/18 210 lb 9.6 oz (95.5 kg)  12/08/17 210 lb (95.3 kg)   Patient has HTN. Today's BP:  120/80.   Patient has ASCAD  S/p PCA/stenting followed by Rotator Ablation in 2015 (Dr Aundra Dubin).   Patient also has hx/o pAfib (on Xarelto) with RFA  x 2 in 2014/2016.He has had several episodes this past summer with successful conversion; has been discussing should he have another episode will convert and perform second ablation. Patient has had no complaints of any cardiac type chest pain, palpitations, dyspnea/orthopnea/PND, dizziness, claudication, or dependent edema.  Hyperlipidemia is controlled with diet & meds, he is on crestor, taking 20 mg daily. Patient denies myalgias or other med SE's. Last Lipids were at goal -   Lab Results  Component Value Date   CHOL 170 12/08/2017   HDL 60 12/08/2017   LDLCALC 92 12/08/2017   TRIG 88 12/08/2017   CHOLHDL 2.8 12/08/2017   Also, the patient has history of PreDiabetes with A1c 5.7% in 2011 and 5.9% in 2013, and then 5.6% x 2 in late 2013 now controlled by diet.  He's had no symptoms of reactive hypoglycemia, diabetic polys, paresthesias or visual blurring.   Lab Results  Component Value Date   HGBA1C 5.4 12/08/2017   Further, the patient also has history of Vitamin D Deficiency of 27 in 2008 and supplements vitamin D without any suspected side-effects. Lab Results  Component Value Date   VD25OH 86 12/08/2017   Lab Results  Component Value Date   GFRNONAA 84 12/08/2017      Medication Review: Current Outpatient Medications on File Prior to Visit  Medication Sig  . ALPRAZolam (XANAX) 1 MG tablet Take 1/2 to 1 tablet ONLY if needed at hour of sleep  . bisoprolol-hydrochlorothiazide (ZIAC) 10-6.25 MG tablet TAKE 1 TABLET BY MOUTH ONCE DAILY  . CARTIA XT 180 MG 24 hr capsule TAKE 1 CAPSULE BY MOUTH ONCE DAILY  . Cholecalciferol (VITAMIN D PO) Take 5,000 Units by mouth 2 (two) times daily.  . Multiple Vitamin (MULITIVITAMIN WITH MINERALS) TABS Take 1 tablet by mouth daily. FOR MEN  . NITROSTAT 0.4 MG SL tablet DISSOLVE ONE TABLET UNDER THE  TONGUE EVERY 5 MINUTES AS NEEDED FOR CHEST PAIN.  DO NOT EXCEED A TOTAL OF 3 DOSES IN 15 MINUTES  . rivaroxaban (XARELTO) 20 MG TABS tablet TAKE ONE TABLET BY MOUTH ONCE DAILY WITH SUPPER  . rosuvastatin (CRESTOR) 40 MG tablet TAKE 1 TABLET BY MOUTH ONCE DAILY   No current facility-administered medications on file prior to visit.     Current Problems (verified) Patient Active Problem List   Diagnosis Date Noted  . Insomnia 03/23/2018  . Obesity (BMI 30.0-34.9) 08/17/2017  . PAF (paroxysmal atrial fibrillation) ([place])   . PAD (peripheral artery disease) ([place])   . OSA on CPAP   . History of colon polyps   . Hearing loss of both ears   . Bladder cancer ([place]) 02/16/2015  . GERD  02/16/2015  . Medication management 12/28/2013  . CAD- CFX PTCA 11/24/13 with plans for DES 11/28/13 11/25/2013  . Hyperlipidemia 06/22/2013  . Abnormal glucose 06/22/2013  . Vitamin D deficiency 06/22/2013  . Hypertension   . Mitral regurgitation     Screening Tests Immunization History  Administered Date(s) Administered  . DT 07/22/2003, 12/28/2013  . Influenza Whole 05/19/2013  . Influenza, High Dose Seasonal PF 04/08/2017  . Influenza,inj,Quad PF,6+ Mos 05/09/2014  . Influenza-Unspecified 05/14/2015, 05/01/2016  . Pneumococcal Conjugate-13 07/11/2014  . Pneumococcal  Polysaccharide-23 12/04/2009, 06/10/2016  . Tdap 09/22/2013   TDAP 2015 Influenza 2018 Prevnar 13 2015 Pneumonia 2011, 2017  Preventative care: Last colonoscopy: 04/2016 at [facility], rec. 2020 follow up  Names of Other Physician/Practitioners you currently use: 1. [facility] here for primary care 2. Dr Sherley Bounds, eye doctor, last visit 2019, has appt in Oct 3. ? Name of his dentist, dentist, last visit 2014, several years  Patient Care Team: [name], MD as PCP - General (Internal Medicine) [name], MD as PCP - Cardiology (Cardiology) Dwan Bolt, MD as  Referring Physician (Cardiology) [name], MD as Consulting Physician (Cardiology) [name], MD as Consulting Physician (Urology) Mcarthur Rossetti, MD as Consulting Physician (Orthopedic Surgery) Ponciano Ort, MD as Referring Physician (Urology) Garlan Fair, MD as Consulting Physician (Gastroenterology)   Allergies Allergies  Allergen Reactions  . Zetia [Ezetimibe] Other (See Comments)    myalgias     SURGICAL HISTORY He  has a past surgical history that includes Total hip arthroplasty (07/25/2011); TEE without cardioversion (07/26/2012); Cardioversion (08/06/2012); Total hip arthroplasty (Left, 02-08-2010); Cataract extraction w/ intraocular lens  implant, bilateral; Cardiac electrophysiology study and ablation (07-27-2012  DR ALLRED); Tonsillectomy (age 44); Cystoscopy w/ retrogrades (Bilateral, 04/12/2013); Transurethral resection of bladder tumor with gyrus (turbt-gyrus) (N/A, 04/12/2013); atrial fibrillation ablation (N/A, 07/27/2012); left heart catheterization with coronary angiogram (N/A, 11/24/2013); percutaneous coronary stent intervention (pci-s) (11/24/2013); percutaneous coronary rotoblator intervention (pci-r) (N/A, 11/28/2013); TEE without cardioversion (N/A, 08/25/2014); Cardioversion (N/A, 08/25/2014); Cardioversion (N/A, 11/13/2014); Cardioversion (N/A, 12/28/2014); Cardiac catheterization (N/A, 01/26/2015); TEE without cardioversion (01/26/2015); Ablation of dysrhythmic focus (01/26/2015); Colonoscopy with propofol (N/A, 02/13/2015); Cardioversion (N/A, 01/30/2017); and Cardioversion (N/A, 04/06/2017). FAMILY HISTORY His family history includes Heart attack in his mother; Heart disease in his father and mother; Heart failure in his father. SOCIAL HISTORY He  reports that he quit smoking about 36 years ago. His smoking use included cigarettes. He has a 20.00 pack-year smoking history. He has never used smokeless tobacco. He reports that he does not drink alcohol or use  drugs.  MEDICARE WELLNESS OBJECTIVES: Physical activity: Current Exercise Habits: Home exercise routine, Type of exercise: Other - see comments(Bicycle), Time (Minutes): 60, Frequency (Times/Week): 5, Weekly Exercise (Minutes/Week): 300, Intensity: Moderate, Exercise limited by: None identified Cardiac risk factors: Cardiac Risk Factors include: advanced age (>79men, >3 women);male gender;dyslipidemia;hypertension;obesity (BMI >30kg/m2);smoking/ tobacco exposure Depression/mood screen:   Depression screen [facility] 2/9 03/25/2018  Decreased Interest 0  Down, Depressed, Hopeless 0  PHQ - 2 Score 0    ADLs:  In your present state of health, do you have any difficulty performing the following activities: 03/25/2018 12/08/2017  Hearing? Y N  Comment Doesn't want hearing aids yet, high frequency only -  Vision? N N  Difficulty concentrating or making decisions? N N  Walking or climbing stairs? N N  Dressing or bathing? N N  Doing errands, shopping? N N  Some recent data might be hidden     Cognitive Testing  Alert? Yes  Normal Appearance?Yes  Oriented to person? Yes  Place? Yes   Time? Yes  Recall of three objects?  Yes  Can perform simple calculations? Yes  Displays appropriate judgment?Yes  Can read the correct time from a watch face?Yes  EOL planning: Does Patient Have a Medical Advance Directive?: No Would patient like information on creating a medical advance directive?: No - Patient declined    Objective:     Blood pressure 120/80, pulse 60,  temperature (!) 97.5 F (36.4 C), height 5' 9.5" (1.765 m), weight 209 lb (94.8 kg), SpO2 97 %.   General Appearance:  Alert  WD/WN, male  in no apparent distress. Eyes: PERRLA, EOMs nl, conjunctiva normal, normal fundi and vessels. Sinuses: No frontal/maxillary tenderness ENT/Mouth: EACs patent / TMs  nl. Nares clear without erythema, swelling, mucoid exudates. Oral hygiene is good. No erythema, swelling, or exudate. Tongue normal,  non-obstructing. Tonsils not swollen or erythematous. Hearing normal.  Neck: Supple, thyroid normal. No bruits, nodes or JVD. Respiratory: Respiratory effort normal.  BS equal and clear bilateral without rales, rhonci, wheezing or stridor. Cardio: Heart sounds are normal with regular rate and rhythm and no murmurs, rubs or gallops. Peripheral pulses are normal and equal bilaterally without edema. No aortic or femoral bruits. Chest: symmetric with normal excursions and percussion.  Abdomen: Flat, soft, with nl bowel sounds. No guarding, rebound, hernias, masses, or organomegaly. Mild suprapubic tenderness Lymphatics: Non tender without lymphadenopathy.  Musculoskeletal: Full ROM all peripheral extremities, joint stability, 5/5 strength, and normal gait. Skin: Warm and dry without rashes, lesions, cyanosis, clubbing or  ecchymosis.  Neuro: Cranial nerves intact, reflexes equal bilaterally. Normal muscle tone, no cerebellar symptoms. Sensation intact.  Pysch: Alert and oriented X 3 with normal affect, insight and judgment appropriate.    Medicare Attestation I have personally reviewed: The patient's medical and social history Their use of alcohol, tobacco or illicit drugs Their current medications and supplements The patient's functional ability including ADLs,fall risks, home safety risks, cognitive, and hearing and visual impairment Diet and physical activities Evidence for depression or mood disorders  The patient's weight, height, BMI, and visual acuity have been recorded in the chart.  I have made referrals, counseling, and provided education to the patient based on review of the above and I have provided the patient with a written personalized care plan for preventive services.  Over 40 minutes of exam, counseling, chart review was performed.  Izora Ribas, NP   03/25/2018

## 2018-03-25 ENCOUNTER — Other Ambulatory Visit: Payer: Self-pay

## 2018-03-25 ENCOUNTER — Encounter: Payer: Self-pay | Admitting: Adult Health

## 2018-03-25 ENCOUNTER — Ambulatory Visit: Payer: Medicare Other | Admitting: Adult Health

## 2018-03-25 VITALS — BP 120/80 | HR 60 | Temp 97.5°F | Ht 69.5 in | Wt 209.0 lb

## 2018-03-25 DIAGNOSIS — I739 Peripheral vascular disease, unspecified: Secondary | ICD-10-CM

## 2018-03-25 DIAGNOSIS — E559 Vitamin D deficiency, unspecified: Secondary | ICD-10-CM

## 2018-03-25 DIAGNOSIS — I340 Nonrheumatic mitral (valve) insufficiency: Secondary | ICD-10-CM

## 2018-03-25 DIAGNOSIS — G4733 Obstructive sleep apnea (adult) (pediatric): Secondary | ICD-10-CM

## 2018-03-25 DIAGNOSIS — R6889 Other general symptoms and signs: Secondary | ICD-10-CM

## 2018-03-25 DIAGNOSIS — G4700 Insomnia, unspecified: Secondary | ICD-10-CM

## 2018-03-25 DIAGNOSIS — I480 Paroxysmal atrial fibrillation: Secondary | ICD-10-CM

## 2018-03-25 DIAGNOSIS — E782 Mixed hyperlipidemia: Secondary | ICD-10-CM

## 2018-03-25 DIAGNOSIS — Z0001 Encounter for general adult medical examination with abnormal findings: Secondary | ICD-10-CM | POA: Diagnosis not present

## 2018-03-25 DIAGNOSIS — Z860100 Personal history of colon polyps, unspecified: Secondary | ICD-10-CM

## 2018-03-25 DIAGNOSIS — Z79899 Other long term (current) drug therapy: Secondary | ICD-10-CM

## 2018-03-25 DIAGNOSIS — R7309 Other abnormal glucose: Secondary | ICD-10-CM

## 2018-03-25 DIAGNOSIS — H9193 Unspecified hearing loss, bilateral: Secondary | ICD-10-CM

## 2018-03-25 DIAGNOSIS — K219 Gastro-esophageal reflux disease without esophagitis: Secondary | ICD-10-CM

## 2018-03-25 DIAGNOSIS — I2510 Atherosclerotic heart disease of native coronary artery without angina pectoris: Secondary | ICD-10-CM | POA: Diagnosis not present

## 2018-03-25 DIAGNOSIS — E669 Obesity, unspecified: Secondary | ICD-10-CM

## 2018-03-25 DIAGNOSIS — C679 Malignant neoplasm of bladder, unspecified: Secondary | ICD-10-CM

## 2018-03-25 DIAGNOSIS — E66811 Obesity, class 1: Secondary | ICD-10-CM

## 2018-03-25 DIAGNOSIS — I10 Essential (primary) hypertension: Secondary | ICD-10-CM | POA: Diagnosis not present

## 2018-03-25 DIAGNOSIS — Z1211 Encounter for screening for malignant neoplasm of colon: Secondary | ICD-10-CM

## 2018-03-25 DIAGNOSIS — Z0000 Encounter for general adult medical examination without abnormal findings: Secondary | ICD-10-CM

## 2018-03-25 DIAGNOSIS — R399 Unspecified symptoms and signs involving the genitourinary system: Secondary | ICD-10-CM

## 2018-03-25 DIAGNOSIS — Z9989 Dependence on other enabling machines and devices: Secondary | ICD-10-CM

## 2018-03-25 DIAGNOSIS — Z1212 Encounter for screening for malignant neoplasm of rectum: Principal | ICD-10-CM

## 2018-03-25 LAB — POC HEMOCCULT BLD/STL (HOME/3-CARD/SCREEN)
Card #2 Fecal Occult Blod, POC: NEGATIVE
FECAL OCCULT BLD: NEGATIVE
Fecal Occult Blood, POC: NEGATIVE

## 2018-03-25 NOTE — Patient Instructions (Addendum)
  Mr. Bruun , Thank you for taking time to come for your Medicare Wellness Visit. I appreciate your ongoing commitment to your health goals. Please review the following plan we discussed and let me know if I can assist you in the future.   These are the goals we discussed: Goals    . Blood Pressure < 130/80    . Weight (lb) < 200 lb (90.7 kg)       This is a list of the screening recommended for you and due dates:  Health Maintenance  Topic Date Due  . Flu Shot  04/01/2018*  .  Hepatitis C: One time screening is recommended by Center for Disease Control  (CDC) for  adults born from [birth year] through 1966.   06/21/2018*  . Colon Cancer Screening  04/22/2021  . Tetanus Vaccine  09/23/2023  . Pneumonia vaccines  Completed  *Topic was postponed. The date shown is not the original due date.    Know what a healthy weight is for you (roughly BMI <25) and aim to maintain this  Aim for 7+ servings of fruits and vegetables daily  65-80+ fluid ounces of water or unsweet tea for healthy kidneys  Limit to max 1 drink of alcohol per day; avoid smoking/tobacco  Limit animal fats in diet for cholesterol and heart health - choose grass fed whenever available  Avoid highly processed foods, and foods high in saturated/trans fats  Aim for low stress - take time to unwind and care for your mental health  Aim for 150 min of moderate intensity exercise weekly for heart health, and weights twice weekly for bone health  Aim for 7-9 hours of sleep daily

## 2018-03-26 LAB — COMPLETE METABOLIC PANEL WITH GFR
AG Ratio: 1.9 (calc) (ref 1.0–2.5)
ALT: 38 U/L (ref 9–46)
AST: 36 U/L — AB (ref 10–35)
Albumin: 4.6 g/dL (ref 3.6–5.1)
Alkaline phosphatase (APISO): 47 U/L (ref 40–115)
BUN: 14 mg/dL (ref 7–25)
CO2: 29 mmol/L (ref 20–32)
CREATININE: 0.98 mg/dL (ref 0.70–1.18)
Calcium: 9.8 mg/dL (ref 8.6–10.3)
Chloride: 104 mmol/L (ref 98–110)
GFR, EST AFRICAN AMERICAN: 89 mL/min/{1.73_m2} (ref >=60)
GFR, Est Non African American: 77 mL/min/{1.73_m2} (ref >=60)
GLOBULIN: 2.4 g/dL (ref 1.9–3.7)
Glucose, Bld: 108 mg/dL — ABNORMAL HIGH (ref 65–99)
Potassium: 4.1 mmol/L (ref 3.5–5.3)
Sodium: 142 mmol/L (ref 135–146)
TOTAL PROTEIN: 7 g/dL (ref 6.1–8.1)
Total Bilirubin: 2.1 mg/dL — ABNORMAL HIGH (ref 0.2–1.2)

## 2018-03-26 LAB — LIPID PANEL
CHOL/HDL RATIO: 2.9 (calc) (ref <5.0)
Cholesterol: 186 mg/dL (ref <200)
HDL: 64 mg/dL (ref >40)
LDL CHOLESTEROL (CALC): 101 mg/dL — AB
NON-HDL CHOLESTEROL (CALC): 122 mg/dL (ref <130)
TRIGLYCERIDES: 109 mg/dL (ref <150)

## 2018-03-26 LAB — CBC WITH DIFFERENTIAL/PLATELET
BASOS PCT: 0.9 %
Basophils Absolute: 60 cells/uL (ref 0–200)
Eosinophils Absolute: 281 cells/uL (ref 15–500)
Eosinophils Relative: 4.2 %
HEMATOCRIT: 47.3 % (ref 38.5–50.0)
Hemoglobin: 16 g/dL (ref 13.2–17.1)
Lymphs Abs: 1541 cells/uL (ref 850–3900)
MCH: 30.2 pg (ref 27.0–33.0)
MCHC: 33.8 g/dL (ref 32.0–36.0)
MCV: 89.2 fL (ref 80.0–100.0)
MONOS PCT: 9.6 %
MPV: 9.6 fL (ref 7.5–12.5)
Neutro Abs: 4174 cells/uL (ref 1500–7800)
Neutrophils Relative %: 62.3 %
PLATELETS: 181 10*3/uL (ref 140–400)
RBC: 5.3 10*6/uL (ref 4.20–5.80)
RDW: 12.5 % (ref 11.0–15.0)
TOTAL LYMPHOCYTE: 23 %
WBC mixed population: 643 cells/uL (ref 200–950)
WBC: 6.7 10*3/uL (ref 3.8–10.8)

## 2018-03-26 LAB — HEMOGLOBIN A1C
Hgb A1c MFr Bld: 5.4 % of total Hgb (ref <5.7)
Mean Plasma Glucose: 108 (calc)
eAG (mmol/L): 6 (calc)

## 2018-03-26 LAB — PSA: PSA: 0.6 ng/mL (ref <=4.0)

## 2018-03-26 LAB — TSH: TSH: 1 m[IU]/L (ref 0.40–4.50)

## 2018-04-20 ENCOUNTER — Other Ambulatory Visit: Payer: Self-pay | Admitting: Internal Medicine

## 2018-04-20 ENCOUNTER — Other Ambulatory Visit: Payer: Self-pay | Admitting: Nurse Practitioner

## 2018-04-20 ENCOUNTER — Other Ambulatory Visit (HOSPITAL_COMMUNITY): Payer: Self-pay | Admitting: *Deleted

## 2018-04-20 MED ORDER — RIVAROXABAN 20 MG PO TABS: ORAL_TABLET | ORAL | 0 refills | Status: DC

## 2018-05-16 ENCOUNTER — Encounter (HOSPITAL_COMMUNITY): Payer: Self-pay | Admitting: Emergency Medicine

## 2018-05-16 ENCOUNTER — Emergency Department (HOSPITAL_COMMUNITY)
Admission: EM | Admit: 2018-05-16 | Discharge: 2018-05-16 | Disposition: A | Discharge status: Home / Self Care | Payer: Medicare Other | Attending: Emergency Medicine | Admitting: Emergency Medicine

## 2018-05-16 ENCOUNTER — Other Ambulatory Visit: Payer: Self-pay

## 2018-05-16 DIAGNOSIS — Z87891 Personal history of nicotine dependence: Secondary | ICD-10-CM | POA: Diagnosis not present

## 2018-05-16 DIAGNOSIS — Z8551 Personal history of malignant neoplasm of bladder: Secondary | ICD-10-CM | POA: Insufficient documentation

## 2018-05-16 DIAGNOSIS — I10 Essential (primary) hypertension: Secondary | ICD-10-CM | POA: Insufficient documentation

## 2018-05-16 DIAGNOSIS — R7303 Prediabetes: Secondary | ICD-10-CM | POA: Diagnosis not present

## 2018-05-16 DIAGNOSIS — Z79899 Other long term (current) drug therapy: Secondary | ICD-10-CM | POA: Insufficient documentation

## 2018-05-16 DIAGNOSIS — Z7901 Long term (current) use of anticoagulants: Secondary | ICD-10-CM | POA: Diagnosis not present

## 2018-05-16 DIAGNOSIS — R002 Palpitations: Secondary | ICD-10-CM | POA: Diagnosis present

## 2018-05-16 DIAGNOSIS — I480 Paroxysmal atrial fibrillation: Secondary | ICD-10-CM | POA: Diagnosis not present

## 2018-05-16 LAB — CBC
HCT: 46.1 % (ref 39.0–52.0)
Hemoglobin: 15.7 g/dL (ref 13.0–17.0)
MCH: 29.9 pg (ref 26.0–34.0)
MCHC: 34.1 g/dL (ref 30.0–36.0)
MCV: 87.8 fL (ref 80.0–100.0)
Platelets: 215 10*3/uL (ref 150–400)
RBC: 5.25 MIL/uL (ref 4.22–5.81)
RDW: 12.5 % (ref 11.5–15.5)
WBC: 8.3 10*3/uL (ref 4.0–10.5)
nRBC: 0 % (ref 0.0–0.2)

## 2018-05-16 LAB — BASIC METABOLIC PANEL
Anion gap: 10 (ref 5–15)
BUN: 15 mg/dL (ref 8–23)
CO2: 22 mmol/L (ref 22–32)
CREATININE: 0.83 mg/dL (ref 0.61–1.24)
Calcium: 9.1 mg/dL (ref 8.9–10.3)
Chloride: 110 mmol/L (ref 98–111)
GFR calc Af Amer: >60 mL/min (ref >60)
GFR calc non Af Amer: >60 mL/min (ref >60)
GLUCOSE: 98 mg/dL (ref 70–99)
Potassium: 3.5 mmol/L (ref 3.5–5.1)
SODIUM: 142 mmol/L (ref 135–145)

## 2018-05-16 NOTE — ED Provider Notes (Signed)
[place] EMERGENCY DEPARTMENT Provider Note   CSN: [id number] Arrival date & time: 05/16/18  1657     History   Chief Complaint Chief Complaint  Patient presents with  . Atrial Fibrillation    HPI Damon [name] is a 72 y.o. male.  The history is provided by the patient.  Palpitations   This is a recurrent problem. Episode onset: unknown. The problem occurs daily. The problem has not changed since onset.The problem is associated with an unknown factor. Pertinent negatives include no diaphoresis, no fever, no malaise/fatigue, no numbness, no chest pain, no chest pressure, no claudication, no exertional chest pressure, no irregular heartbeat, no near-syncope, no orthopnea, no PND, no syncope, no abdominal pain, no nausea, no vomiting, no headaches, no back pain, no leg pain, no lower extremity edema, no dizziness, no weakness, no cough, no hemoptysis, no shortness of breath and no sputum production. He has tried nothing for the symptoms. The treatment provided no relief. Risk factors include dyslipidemia.    Past Medical History:  Diagnosis Date  . Bladder cancer ([place])   . Hearing loss of both ears   . History of colon polyps    BENIGN  . Hyperlipidemia   . Hypertension   . Mitral regurgitation   . OSA on CPAP    cpap settign of 13  . PAD (peripheral artery disease) ([facility])    ABI--  RIGHT SIDE NORMAL LEFT SIDE MODERATELY REDUCED W/ DISTAL LEFT SFA STENOSIS//  MILD CALDICATION  . PAF (paroxysmal atrial fibrillation) ([place])     a. s/p PVI 2014; b. s/p redo PVI and CTI 01/2015  . Pre-diabetes   . Vitamin D deficiency     Patient Active Problem List   Diagnosis Date Noted  . Insomnia 03/23/2018  . Obesity (BMI 30.0-34.9) 08/17/2017  . PAF (paroxysmal atrial fibrillation) ([place])   . PAD (peripheral artery disease) ([place])   . OSA on CPAP   . History of colon polyps   . Hearing loss of both ears   . Bladder cancer ([place]) 02/16/2015  . GERD  02/16/2015  .  Medication management 12/28/2013  . CAD- CFX PTCA 11/24/13 with plans for DES 11/28/13 11/25/2013  . Hyperlipidemia 06/22/2013  . Abnormal glucose 06/22/2013  . Vitamin D deficiency 06/22/2013  . Hypertension   . Mitral regurgitation     Past Surgical History:  Procedure Laterality Date  . ABLATION OF DYSRHYTHMIC FOCUS  01/26/2015  . ATRIAL FIBRILLATION ABLATION N/A 07/27/2012   Procedure: ATRIAL FIBRILLATION ABLATION;  Surgeon: [name], MD;  Location: [facility] CATH LAB;  Service: Cardiovascular;  Laterality: N/A;  . CARDIAC ELECTROPHYSIOLOGY STUDY AND ABLATION  07-27-2012  DR ALLRED   SUCCESSFUL ABLATION OF A-FIB  . CARDIOVERSION  08/06/2012   Procedure: CARDIOVERSION;  Surgeon: [name], MD;  Location: [facility] ENDOSCOPY;  Service: Cardiovascular;  Laterality: N/A;  . CARDIOVERSION N/A 08/25/2014   Procedure: CARDIOVERSION;  Surgeon: Larey Dresser, MD;  Location: [place];  Service: Cardiovascular;  Laterality: N/A;  . CARDIOVERSION N/A 11/13/2014   Procedure: CARDIOVERSION;  Surgeon: Sueanne Margarita, MD;  Location: [facility] ENDOSCOPY;  Service: Cardiovascular;  Laterality: N/A;  . CARDIOVERSION N/A 12/28/2014   Procedure: CARDIOVERSION;  Surgeon: [name], MD;  Location: [facility] ENDOSCOPY;  Service: Cardiovascular;  Laterality: N/A;  . CARDIOVERSION N/A 01/30/2017   Procedure: CARDIOVERSION;  Surgeon: Josue Hector, MD;  Location: [facility] ENDOSCOPY;  Service: Cardiovascular;  Laterality: N/A;  . CARDIOVERSION N/A 04/06/2017   Procedure:  CARDIOVERSION;  Surgeon: Dorothy Spark, MD;  Location: [place];  Service: Cardiovascular;  Laterality: N/A;  . CATARACT EXTRACTION W/ INTRAOCULAR LENS  IMPLANT, BILATERAL    . COLONOSCOPY WITH PROPOFOL N/A 02/13/2015   Procedure: COLONOSCOPY WITH PROPOFOL;  Surgeon: Garlan Fair, MD;  Location: [facility] ENDOSCOPY;  Service: Endoscopy;  Laterality: N/A;  . CYSTOSCOPY W/ RETROGRADES Bilateral 04/12/2013   Procedure: CYSTOSCOPY WITH RETROGRADE PYELOGRAM;  Surgeon:  [name], MD;  Location[facility];  Service: Urology;  Laterality: Bilateral;  . ELECTROPHYSIOLOGIC STUDY N/A 01/26/2015   Procedure: Atrial Fibrillation Ablation;  Surgeon: [name], MD;  Location: [place] CV LAB;  Service: Cardiovascular;  Laterality: N/A;  . LEFT HEART CATHETERIZATION WITH CORONARY ANGIOGRAM N/A 11/24/2013   Procedure: LEFT HEART CATHETERIZATION WITH CORONARY ANGIOGRAM;  Surgeon: [name], MD;  Location: [facility] CATH LAB;  Service: Cardiovascular;  Laterality: N/A;  . PERCUTANEOUS CORONARY ROTOBLATOR INTERVENTION (PCI-R) N/A 11/28/2013   Procedure: PERCUTANEOUS CORONARY ROTOBLATOR INTERVENTION (PCI-R);  Surgeon: [name], MD;  Location: [facility] CATH LAB;  Service: Cardiovascular;  Laterality: N/A;  . PERCUTANEOUS CORONARY STENT INTERVENTION (PCI-S)  11/24/2013   Procedure: PERCUTANEOUS CORONARY STENT INTERVENTION (PCI-S);  Surgeon: [name], MD;  Location: [facility] CATH LAB;  Service: Cardiovascular;;  . TEE WITHOUT CARDIOVERSION  07/26/2012   Procedure: TRANSESOPHAGEAL ECHOCARDIOGRAM (TEE);  Surgeon: [name], MD;  Location: [facility] ENDOSCOPY;  Service: Cardiovascular;  Laterality: N/A;  . TEE WITHOUT CARDIOVERSION N/A 08/25/2014   Procedure: TRANSESOPHAGEAL ECHOCARDIOGRAM (TEE);  Surgeon: Larey Dresser, MD;  Location: [place];  Service: Cardiovascular;  Laterality: N/A;  . TEE WITHOUT CARDIOVERSION  01/26/2015   Procedure: Transesophageal Echocardiogram (Tee);  Surgeon: [name], MD;  Location: [place] CV LAB;  Service: Cardiovascular;;  . TONSILLECTOMY  age 53  . TOTAL HIP ARTHROPLASTY  07/25/2011   Procedure: TOTAL HIP ARTHROPLASTY ANTERIOR APPROACH;  Surgeon: Mcarthur Rossetti;  Location: [facility] ORS;  Service: Orthopedics;  Laterality: Right;  . TOTAL HIP ARTHROPLASTY Left 02-08-2010  . TRANSURETHRAL RESECTION OF BLADDER TUMOR WITH GYRUS (TURBT-GYRUS) N/A 04/12/2013   Procedure: TRANSURETHRAL RESECTION OF BLADDER  TUMOR WITH GYRUS (TURBT-GYRUS);  Surgeon: [name], MD;  Location: [facility];  Service: Urology;  Laterality: N/A;        Home Medications    Prior to Admission medications   Medication Sig Start Date End Date Taking? Authorizing Provider  ALPRAZolam Duanne Moron) 1 MG tablet Take 1/2 to 1 tab at hour of sleep 04/20/18   [name], MD  bisoprolol-hydrochlorothiazide [facility]) 10-6.25 MG tablet TAKE 1 TABLET BY MOUTH ONCE DAILY 12/28/17   Liane Comber, NP  CARTIA XT 180 MG 24 hr capsule TAKE 1 CAPSULE BY MOUTH ONCE DAILY 02/11/18   Sherran Needs, NP  Cholecalciferol (VITAMIN D PO) Take 5,000 Units by mouth 2 (two) times daily.    [provider]  Multiple Vitamin (MULITIVITAMIN WITH MINERALS) TABS Take 1 tablet by mouth daily. FOR MEN    [provider]  NITROSTAT 0.4 MG SL tablet DISSOLVE ONE TABLET UNDER THE TONGUE EVERY 5 MINUTES AS NEEDED FOR CHEST PAIN.  DO NOT EXCEED A TOTAL OF 3 DOSES IN 15 MINUTES 10/11/15   [name], MD  rivaroxaban (XARELTO) 20 MG TABS tablet TAKE ONE TABLET BY MOUTH ONCE DAILY WITH SUPPER 04/20/18   Sherran Needs, NP  rosuvastatin (CRESTOR) 40 MG tablet TAKE 1 TABLET BY MOUTH ONCE DAILY 08/12/17   [name],  MD  XARELTO 20 MG TABS tablet TAKE 1 TABLET BY MOUTH ONCE DAILY WITH SUPPER 04/20/18   Sherran Needs, NP    Family History Family History  Problem Relation Age of Onset  . Heart attack Mother   . Heart disease Mother   . Heart failure Father   . Heart disease Father     Social History Social History   Tobacco Use  . Smoking status: Former Smoker    Packs/day: 2.00    Years: 10.00    Pack years: 20.00    Types: Cigarettes    Last attempt to quit: 09/20/1981    Years since quitting: 36.6  . Smokeless tobacco: Never Used  Substance Use Topics  . Alcohol use: No    Comment: pt states he has stopped drinking alcohol  . Drug use: No     Allergies   Zetia [ezetimibe]   Review  of Systems Review of Systems  Constitutional: Negative for chills, diaphoresis, fever and malaise/fatigue.  HENT: Negative for ear pain and sore throat.   Eyes: Negative for pain and visual disturbance.  Respiratory: Negative for cough, hemoptysis, sputum production and shortness of breath.   Cardiovascular: Positive for palpitations. Negative for chest pain, orthopnea, claudication, syncope, PND and near-syncope.  Gastrointestinal: Negative for abdominal pain, nausea and vomiting.  Genitourinary: Negative for dysuria and hematuria.  Musculoskeletal: Negative for arthralgias and back pain.  Skin: Negative for color change and rash.  Neurological: Negative for dizziness, seizures, syncope, weakness, numbness and headaches.  All other systems reviewed and are negative.    Physical Exam Updated Vital Signs  ED Triage Vitals  Enc Vitals Group     BP 05/16/18 1701 119/79     Pulse Rate 05/16/18 1701 100     Resp 05/16/18 1701 16     Temp 05/16/18 1701 99.1 F (37.3 C)     Temp Source 05/16/18 1701 Oral     SpO2 05/16/18 1701 96 %     Weight 05/16/18 1705 205 lb (93 kg)     Height 05/16/18 1705 5\' 9"  (1.753 m)     Head Circumference --      Peak Flow --      Pain Score 05/16/18 1705 0     Pain Loc --      Pain Edu? --      Excl. in [place]? --     Physical Exam  Constitutional: He is oriented to person, place, and time. He appears well-developed and well-nourished.  HENT:  Head: Normocephalic and atraumatic.  Eyes: Pupils are equal, round, and reactive to light. Conjunctivae and EOM are normal.  Neck: Normal range of motion. Neck supple.  Cardiovascular: Normal rate and intact distal pulses. An irregularly irregular rhythm present.  No murmur heard. Pulmonary/Chest: Effort normal and breath sounds normal. No stridor. No respiratory distress. He has no wheezes.  Abdominal: Soft. There is no tenderness.  Musculoskeletal: He exhibits no edema.  Neurological: He is alert and  oriented to person, place, and time.  Skin: Skin is warm and dry.  Psychiatric: He has a normal mood and affect.  Nursing note and vitals reviewed.    ED Treatments / Results  Labs (all labs ordered are listed, but only abnormal results are displayed) Labs Reviewed  BASIC METABOLIC PANEL  CBC    EKG EKG Interpretation  Date/Time:  Sunday May 16 2018 17:00:24 EDT Ventricular Rate:  92 PR Interval:    QRS Duration: 86 QT Interval:  380  QTC Calculation: 469 R Axis:   2 Text Interpretation:  Atrial fibrillation Nonspecific ST and T wave abnormality Prolonged QT Abnormal ECG Confirmed by Lennice Sites [phone]) on 05/16/2018 5:10:19 PM   Radiology No results found.  Procedures Procedures (including critical care time)  Medications Ordered in ED Medications - No data to display   Initial Impression / Assessment and Plan / ED Course  I have reviewed the triage vital signs and the nursing notes.  Pertinent labs & imaging results that were available during my care of the patient were reviewed by me and considered in my medical decision making (see chart for details).     Damon [name] is a 72 year old male who presents to the ED with concern for atrial fibrillation.  Patient with normal vitals.  No fever.  Patient with EKG that shows rate controlled atrial fibrillation in the 70s.  No ischemic changes.  Patient is asymptomatic.  Normal vitals.  No chest pain, no shortness of breath.  States that he felt a change in his heart rate possibly Friday but is more convinced today that he is possibly back in atrial fibrillation.  He takes a calcium channel blocker and is on Xarelto.  He states he is compliant with his medications.  Patient had lab work that showed no significant anemia, electrolyte abnormality, kidney injury.  Patient is stable and with no RVR.  Unsure of when this began.  At this time recommend follow-up with cardiology who has recommended possible ablation if  needed.  He is hemodynamically stable and asymptomatic.  No need for any intervention at this time.  Will not offer cardioversion at this time as unaware when patient went back into atrial fibrillation.  Patient discharged from ED in good condition.  Understands return precautions.  This chart was dictated using voice recognition software.  Despite best efforts to proofread,  errors can occur which can change the documentation meaning.   Final Clinical Impressions(s) / ED Diagnoses   Final diagnoses:  Paroxysmal atrial fibrillation [facility])    ED Discharge Orders    None       Lennice Sites, DO 05/16/18 1847

## 2018-05-16 NOTE — ED Triage Notes (Signed)
Pt reports feeling heart racing and being in A fib since this morning.  Denies SOB, pain, or other symptoms.

## 2018-05-16 NOTE — ED Notes (Signed)
Pt in NSR at this time.

## 2018-05-16 NOTE — ED Notes (Signed)
Pt's wife came out of room wanting to know when pt was going to be released.  St's MD said he would tell someone to discharge him.  Explained to her that pt had not been discharged at this time but I would talk to MD

## 2018-05-17 ENCOUNTER — Encounter (HOSPITAL_COMMUNITY): Payer: Self-pay | Admitting: Nurse Practitioner

## 2018-05-17 ENCOUNTER — Ambulatory Visit (HOSPITAL_COMMUNITY)
Admission: RE | Admit: 2018-05-17 | Discharge: 2018-05-17 | Disposition: A | Discharge status: Home / Self Care | Payer: Medicare Other | Source: Ambulatory Visit | Attending: Nurse Practitioner | Admitting: Nurse Practitioner

## 2018-05-17 VITALS — BP 114/66 | HR 76 | Ht 69.0 in | Wt 207.0 lb

## 2018-05-17 DIAGNOSIS — E785 Hyperlipidemia, unspecified: Secondary | ICD-10-CM | POA: Insufficient documentation

## 2018-05-17 DIAGNOSIS — E559 Vitamin D deficiency, unspecified: Secondary | ICD-10-CM | POA: Diagnosis not present

## 2018-05-17 DIAGNOSIS — I480 Paroxysmal atrial fibrillation: Secondary | ICD-10-CM | POA: Diagnosis not present

## 2018-05-17 DIAGNOSIS — R7303 Prediabetes: Secondary | ICD-10-CM | POA: Diagnosis not present

## 2018-05-17 DIAGNOSIS — I739 Peripheral vascular disease, unspecified: Secondary | ICD-10-CM | POA: Insufficient documentation

## 2018-05-17 DIAGNOSIS — I10 Essential (primary) hypertension: Secondary | ICD-10-CM | POA: Insufficient documentation

## 2018-05-17 DIAGNOSIS — Z87891 Personal history of nicotine dependence: Secondary | ICD-10-CM | POA: Insufficient documentation

## 2018-05-17 DIAGNOSIS — Z7901 Long term (current) use of anticoagulants: Secondary | ICD-10-CM | POA: Insufficient documentation

## 2018-05-17 DIAGNOSIS — Z955 Presence of coronary angioplasty implant and graft: Secondary | ICD-10-CM | POA: Insufficient documentation

## 2018-05-17 DIAGNOSIS — Z8249 Family history of ischemic heart disease and other diseases of the circulatory system: Secondary | ICD-10-CM | POA: Insufficient documentation

## 2018-05-17 DIAGNOSIS — Z8551 Personal history of malignant neoplasm of bladder: Secondary | ICD-10-CM | POA: Insufficient documentation

## 2018-05-17 DIAGNOSIS — G4733 Obstructive sleep apnea (adult) (pediatric): Secondary | ICD-10-CM | POA: Insufficient documentation

## 2018-05-17 DIAGNOSIS — Z79899 Other long term (current) drug therapy: Secondary | ICD-10-CM | POA: Diagnosis not present

## 2018-05-17 DIAGNOSIS — I4891 Unspecified atrial fibrillation: Secondary | ICD-10-CM | POA: Diagnosis present

## 2018-05-17 NOTE — H&P (View-Only) (Signed)
Primary Care Physician: [name], MD Referring Physician: [name] is a 72 y.o. male with a h/o  afib s/p ablation x 2, 2014, 2016 and h/o prior multiple cardioversion's. He is being seen in the afib clinic for breakthrough afib,late last week, no know trigger. It has been about 13 months since last cardioversion.  He required cardioversion x 2  the previous year. . No bleeding issues with xarelto, being compliant . He saw Dr. [name] months ago and it was decided that if afib burden increased, he would go ahead with another ablation, however, he would like to get back in SR asap and then purse appointment with Dr. [name] to discuss ablation. He went to the ER yesterday and they would not cardiovert as he was rate controlled and not symptomatic. He does feel tired in afib.  Today, he denies symptoms of palpitations, chest pain, shortness of breath, orthopnea, PND, lower extremity edema, dizziness, presyncope, syncope, or neurologic sequela. The patient is tolerating medications without difficulties and is otherwise without complaint today.   Past Medical History:  Diagnosis Date  . Bladder cancer ([place])   . Hearing loss of both ears   . History of colon polyps    BENIGN  . Hyperlipidemia   . Hypertension   . Mitral regurgitation   . OSA on CPAP    cpap settign of 13  . PAD (peripheral artery disease) ([facility])    ABI--  RIGHT SIDE NORMAL LEFT SIDE MODERATELY REDUCED W/ DISTAL LEFT SFA STENOSIS//  MILD CALDICATION  . PAF (paroxysmal atrial fibrillation) ([place])     a. s/p PVI 2014; b. s/p redo PVI and CTI 01/2015  . Pre-diabetes   . Vitamin D deficiency    Past Surgical History:  Procedure Laterality Date  . ABLATION OF DYSRHYTHMIC FOCUS  01/26/2015  . ATRIAL FIBRILLATION ABLATION N/A 07/27/2012   Procedure: ATRIAL FIBRILLATION ABLATION;  Surgeon: [name], MD;  Location: [facility] CATH LAB;  Service: Cardiovascular;  Laterality: N/A;  . CARDIAC ELECTROPHYSIOLOGY  STUDY AND ABLATION  07-27-2012  DR ALLRED   SUCCESSFUL ABLATION OF A-FIB  . CARDIOVERSION  08/06/2012   Procedure: CARDIOVERSION;  Surgeon: [name], MD;  Location: [facility] ENDOSCOPY;  Service: Cardiovascular;  Laterality: N/A;  . CARDIOVERSION N/A 08/25/2014   Procedure: CARDIOVERSION;  Surgeon: Larey Dresser, MD;  Location: [place];  Service: Cardiovascular;  Laterality: N/A;  . CARDIOVERSION N/A 11/13/2014   Procedure: CARDIOVERSION;  Surgeon: Sueanne Margarita, MD;  Location: [facility] ENDOSCOPY;  Service: Cardiovascular;  Laterality: N/A;  . CARDIOVERSION N/A 12/28/2014   Procedure: CARDIOVERSION;  Surgeon: [name], MD;  Location: [facility] ENDOSCOPY;  Service: Cardiovascular;  Laterality: N/A;  . CARDIOVERSION N/A 01/30/2017   Procedure: CARDIOVERSION;  Surgeon: Josue Hector, MD;  Location: [facility] ENDOSCOPY;  Service: Cardiovascular;  Laterality: N/A;  . CARDIOVERSION N/A 04/06/2017   Procedure: CARDIOVERSION;  Surgeon: Dorothy Spark, MD;  Location: [place];  Service: Cardiovascular;  Laterality: N/A;  . CATARACT EXTRACTION W/ INTRAOCULAR LENS  IMPLANT, BILATERAL    . COLONOSCOPY WITH PROPOFOL N/A 02/13/2015   Procedure: COLONOSCOPY WITH PROPOFOL;  Surgeon: Garlan Fair, MD;  Location: [facility] ENDOSCOPY;  Service: Endoscopy;  Laterality: N/A;  . CYSTOSCOPY W/ RETROGRADES Bilateral 04/12/2013   Procedure: CYSTOSCOPY WITH RETROGRADE PYELOGRAM;  Surgeon: [name], MD;  Location: [facility];  Service: Urology;  Laterality: Bilateral;  . ELECTROPHYSIOLOGIC STUDY N/A 01/26/2015   Procedure: Atrial Fibrillation Ablation;  Surgeon: [name], MD;  Location: [place] CV LAB;  Service: Cardiovascular;  Laterality: N/A;  . LEFT HEART CATHETERIZATION WITH CORONARY ANGIOGRAM N/A 11/24/2013   Procedure: LEFT HEART CATHETERIZATION WITH CORONARY ANGIOGRAM;  Surgeon: [name], MD;  Location: [facility] CATH LAB;  Service: Cardiovascular;  Laterality: N/A;  . PERCUTANEOUS CORONARY  ROTOBLATOR INTERVENTION (PCI-R) N/A 11/28/2013   Procedure: PERCUTANEOUS CORONARY ROTOBLATOR INTERVENTION (PCI-R);  Surgeon: [name], MD;  Location: [facility] CATH LAB;  Service: Cardiovascular;  Laterality: N/A;  . PERCUTANEOUS CORONARY STENT INTERVENTION (PCI-S)  11/24/2013   Procedure: PERCUTANEOUS CORONARY STENT INTERVENTION (PCI-S);  Surgeon: [name], MD;  Location: [facility] CATH LAB;  Service: Cardiovascular;;  . TEE WITHOUT CARDIOVERSION  07/26/2012   Procedure: TRANSESOPHAGEAL ECHOCARDIOGRAM (TEE);  Surgeon: [name], MD;  Location: [facility] ENDOSCOPY;  Service: Cardiovascular;  Laterality: N/A;  . TEE WITHOUT CARDIOVERSION N/A 08/25/2014   Procedure: TRANSESOPHAGEAL ECHOCARDIOGRAM (TEE);  Surgeon: Larey Dresser, MD;  Location: [place];  Service: Cardiovascular;  Laterality: N/A;  . TEE WITHOUT CARDIOVERSION  01/26/2015   Procedure: Transesophageal Echocardiogram (Tee);  Surgeon: [name], MD;  Location: [place] CV LAB;  Service: Cardiovascular;;  . TONSILLECTOMY  age 26  . TOTAL HIP ARTHROPLASTY  07/25/2011   Procedure: TOTAL HIP ARTHROPLASTY ANTERIOR APPROACH;  Surgeon: Mcarthur Rossetti;  Location: [facility];  Service: Orthopedics;  Laterality: Right;  . TOTAL HIP ARTHROPLASTY Left 02-08-2010  . TRANSURETHRAL RESECTION OF BLADDER TUMOR WITH GYRUS (TURBT-GYRUS) N/A 04/12/2013   Procedure: TRANSURETHRAL RESECTION OF BLADDER TUMOR WITH GYRUS (TURBT-GYRUS);  Surgeon: [name], MD;  Location: [facility];  Service: Urology;  Laterality: N/A;    Current Outpatient Medications  Medication Sig Dispense Refill  . ALPRAZolam (XANAX) 1 MG tablet Take 1/2 to 1 tab at hour of sleep 30 tablet 0  . bisoprolol-hydrochlorothiazide (ZIAC) 10-6.25 MG tablet TAKE 1 TABLET BY MOUTH ONCE DAILY 90 tablet 1  . CARTIA XT 180 MG 24 hr capsule TAKE 1 CAPSULE BY MOUTH ONCE DAILY 90 capsule 1  . Cholecalciferol (VITAMIN D PO) Take 5,000 Units by mouth 2 (two) times  daily.    . Multiple Vitamin (MULITIVITAMIN WITH MINERALS) TABS Take 1 tablet by mouth daily. FOR MEN    . rivaroxaban (XARELTO) 20 MG TABS tablet TAKE ONE TABLET BY MOUTH ONCE DAILY WITH SUPPER 90 tablet 0  . rosuvastatin (CRESTOR) 40 MG tablet TAKE 1 TABLET BY MOUTH ONCE DAILY 90 tablet 1  . NITROSTAT 0.4 MG SL tablet DISSOLVE ONE TABLET UNDER THE TONGUE EVERY 5 MINUTES AS NEEDED FOR CHEST PAIN.  DO NOT EXCEED A TOTAL OF 3 DOSES IN 15 MINUTES (Patient not taking: Reported on 05/17/2018) 25 tablet 6   No current facility-administered medications for this encounter.     Allergies  Allergen Reactions  . Zetia [Ezetimibe] Other (See Comments)    myalgias     Social History   Socioeconomic History  . Marital status: Married    Spouse name: Not on file  . Number of children: Not on file  . Years of education: Not on file  . Highest education level: Not on file  Occupational History  . Not on file  Social Needs  . Financial resource strain: Not on file  . Food insecurity:    Worry: Not on file    Inability: Not on file  . Transportation needs:    Medical: Not on file    Non-medical: Not on file  Tobacco  Use  . Smoking status: Former Smoker    Packs/day: 2.00    Years: 10.00    Pack years: 20.00    Types: Cigarettes    Last attempt to quit: 09/20/1981    Years since quitting: 36.6  . Smokeless tobacco: Never Used  Substance and Sexual Activity  . Alcohol use: No    Comment: pt states he has stopped drinking alcohol  . Drug use: No  . Sexual activity: Not on file  Lifestyle  . Physical activity:    Days per week: Not on file    Minutes per session: Not on file  . Stress: Not on file  Relationships  . Social connections:    Talks on phone: Not on file    Gets together: Not on file    Attends religious service: Not on file    Active member of club or organization: Not on file    Attends meetings of clubs or organizations: Not on file    Relationship status: Not on  file  . Intimate partner violence:    Fear of current or ex partner: Not on file    Emotionally abused: Not on file    Physically abused: Not on file    Forced sexual activity: Not on file  Other Topics Concern  . Not on file  Social History Narrative  . Not on file    Family History  Problem Relation Age of Onset  . Heart attack Mother   . Heart disease Mother   . Heart failure Father   . Heart disease Father     ROS- All systems are reviewed and negative except as per the HPI above  Physical Exam: Vitals:   05/17/18 1345  BP: 114/66  Pulse: 76  Weight: 93.9 kg  Height: 5\' 9"  (1.753 m)   Wt Readings from Last 3 Encounters:  05/17/18 93.9 kg  05/16/18 93 kg  03/25/18 94.8 kg    Labs: Lab Results  Component Value Date   NA 142 05/16/2018   K 3.5 05/16/2018   CL 110 05/16/2018   CO2 22 05/16/2018   GLUCOSE 98 05/16/2018   BUN 15 05/16/2018   CREATININE 0.83 05/16/2018   CALCIUM 9.1 05/16/2018   MG 2.2 12/08/2017   Lab Results  Component Value Date   INR 1.1 (H) 11/16/2013   Lab Results  Component Value Date   CHOL 186 03/25/2018   HDL 64 03/25/2018   LDLCALC 101 (H) 03/25/2018   TRIG 109 03/25/2018     GEN- The patient is well appearing, alert and oriented x 3 today.   Head- normocephalic, atraumatic Eyes-  Sclera clear, conjunctiva pink Ears- hearing intact Oropharynx- clear Neck- supple, no JVP Lymph- no cervical lymphadenopathy Lungs- Clear to ausculation bilaterally, normal work of breathing Heart-  irregular rate and rhythm, no murmurs, rubs or gallops, PMI not laterally displaced GI- soft, NT, ND, + BS Extremities- no clubbing, cyanosis, or edema MS- no significant deformity or atrophy Skin- no rash or lesion Psych- euthymic mood, full affect Neuro- strength and sensation are intact  EKG- afib at 76 bpm,qrs int 88 ms, qtc 454 ms Epic records reviewed  Assessment and Plan: 1. Paroxysmal afib Rate controlled Back in afib since late  last week Will schedule for cardioversion but will also schedule appointment with Dr. [name] to discuss ablataion Continue xarelto, chadsvasc score of at least 2, states no missed doses x 3 weeks Continue cardizem 180 mg daily /bisoprolol  2. HTN Stable  F/u with Dr. [name]  In one month  Geroge Baseman. Ozil Stettler, [facility] [address] [place], [place] [address] [phone]

## 2018-05-17 NOTE — Patient Instructions (Addendum)
Cardioversion scheduled for Tuesday, November 5th  - Arrive at the [facility] and go to admitting at 12:30PM  -Do not eat or drink anything after midnight the night prior to your procedure.  - Take all your morning medication with a sip of water prior to arrival.  - You will not be able to drive home after your procedure.  Scheduling will call to setup appt with Dr. [name] for follow up in 1 month

## 2018-05-17 NOTE — Progress Notes (Signed)
Primary Care Physician: [name], MD Referring Physician: [name] is a 73 y.o. male with a h/o  afib s/p ablation x 2, 2014, 2016 and h/o prior multiple cardioversion's. He is being seen in the afib clinic for breakthrough afib,late last week, no know trigger. It has been about 13 months since last cardioversion.  He required cardioversion x 2  the previous year. . No bleeding issues with xarelto, being compliant . He saw Dr. [name] months ago and it was decided that if afib burden increased, he would go ahead with another ablation, however, he would like to get back in SR asap and then purse appointment with Dr. [name] to discuss ablation. He went to the ER yesterday and they would not cardiovert as he was rate controlled and not symptomatic. He does feel tired in afib.  Today, he denies symptoms of palpitations, chest pain, shortness of breath, orthopnea, PND, lower extremity edema, dizziness, presyncope, syncope, or neurologic sequela. The patient is tolerating medications without difficulties and is otherwise without complaint today.   Past Medical History:  Diagnosis Date  . Bladder cancer ([place])   . Hearing loss of both ears   . History of colon polyps    BENIGN  . Hyperlipidemia   . Hypertension   . Mitral regurgitation   . OSA on CPAP    cpap settign of 13  . PAD (peripheral artery disease) ([facility])    ABI--  RIGHT SIDE NORMAL LEFT SIDE MODERATELY REDUCED W/ DISTAL LEFT SFA STENOSIS//  MILD CALDICATION  . PAF (paroxysmal atrial fibrillation) ([place])     a. s/p PVI 2014; b. s/p redo PVI and CTI 01/2015  . Pre-diabetes   . Vitamin D deficiency    Past Surgical History:  Procedure Laterality Date  . ABLATION OF DYSRHYTHMIC FOCUS  01/26/2015  . ATRIAL FIBRILLATION ABLATION N/A 07/27/2012   Procedure: ATRIAL FIBRILLATION ABLATION;  Surgeon: [name], MD;  Location: [facility] CATH LAB;  Service: Cardiovascular;  Laterality: N/A;  . CARDIAC ELECTROPHYSIOLOGY  STUDY AND ABLATION  07-27-2012  DR ALLRED   SUCCESSFUL ABLATION OF A-FIB  . CARDIOVERSION  08/06/2012   Procedure: CARDIOVERSION;  Surgeon: [name], MD;  Location: [facility] ENDOSCOPY;  Service: Cardiovascular;  Laterality: N/A;  . CARDIOVERSION N/A 08/25/2014   Procedure: CARDIOVERSION;  Surgeon: Larey Dresser, MD;  Location: [place];  Service: Cardiovascular;  Laterality: N/A;  . CARDIOVERSION N/A 11/13/2014   Procedure: CARDIOVERSION;  Surgeon: Sueanne Margarita, MD;  Location: [facility] ENDOSCOPY;  Service: Cardiovascular;  Laterality: N/A;  . CARDIOVERSION N/A 12/28/2014   Procedure: CARDIOVERSION;  Surgeon: [name], MD;  Location: [facility] ENDOSCOPY;  Service: Cardiovascular;  Laterality: N/A;  . CARDIOVERSION N/A 01/30/2017   Procedure: CARDIOVERSION;  Surgeon: Josue Hector, MD;  Location: [facility] ENDOSCOPY;  Service: Cardiovascular;  Laterality: N/A;  . CARDIOVERSION N/A 04/06/2017   Procedure: CARDIOVERSION;  Surgeon: Dorothy Spark, MD;  Location: [place];  Service: Cardiovascular;  Laterality: N/A;  . CATARACT EXTRACTION W/ INTRAOCULAR LENS  IMPLANT, BILATERAL    . COLONOSCOPY WITH PROPOFOL N/A 02/13/2015   Procedure: COLONOSCOPY WITH PROPOFOL;  Surgeon: Garlan Fair, MD;  Location: [facility] ENDOSCOPY;  Service: Endoscopy;  Laterality: N/A;  . CYSTOSCOPY W/ RETROGRADES Bilateral 04/12/2013   Procedure: CYSTOSCOPY WITH RETROGRADE PYELOGRAM;  Surgeon: [name], MD;  Location: [facility];  Service: Urology;  Laterality: Bilateral;  . ELECTROPHYSIOLOGIC STUDY N/A 01/26/2015   Procedure: Atrial Fibrillation Ablation;  Surgeon: [name], MD;  Location: [place] CV LAB;  Service: Cardiovascular;  Laterality: N/A;  . LEFT HEART CATHETERIZATION WITH CORONARY ANGIOGRAM N/A 11/24/2013   Procedure: LEFT HEART CATHETERIZATION WITH CORONARY ANGIOGRAM;  Surgeon: [name], MD;  Location: [facility] CATH LAB;  Service: Cardiovascular;  Laterality: N/A;  . PERCUTANEOUS CORONARY  ROTOBLATOR INTERVENTION (PCI-R) N/A 11/28/2013   Procedure: PERCUTANEOUS CORONARY ROTOBLATOR INTERVENTION (PCI-R);  Surgeon: [name], MD;  Location: [facility] CATH LAB;  Service: Cardiovascular;  Laterality: N/A;  . PERCUTANEOUS CORONARY STENT INTERVENTION (PCI-S)  11/24/2013   Procedure: PERCUTANEOUS CORONARY STENT INTERVENTION (PCI-S);  Surgeon: [name], MD;  Location: [facility] CATH LAB;  Service: Cardiovascular;;  . TEE WITHOUT CARDIOVERSION  07/26/2012   Procedure: TRANSESOPHAGEAL ECHOCARDIOGRAM (TEE);  Surgeon: [name], MD;  Location: [facility] ENDOSCOPY;  Service: Cardiovascular;  Laterality: N/A;  . TEE WITHOUT CARDIOVERSION N/A 08/25/2014   Procedure: TRANSESOPHAGEAL ECHOCARDIOGRAM (TEE);  Surgeon: Larey Dresser, MD;  Location: [place];  Service: Cardiovascular;  Laterality: N/A;  . TEE WITHOUT CARDIOVERSION  01/26/2015   Procedure: Transesophageal Echocardiogram (Tee);  Surgeon: [name], MD;  Location: [place] CV LAB;  Service: Cardiovascular;;  . TONSILLECTOMY  age 66  . TOTAL HIP ARTHROPLASTY  07/25/2011   Procedure: TOTAL HIP ARTHROPLASTY ANTERIOR APPROACH;  Surgeon: Mcarthur Rossetti;  Location: [facility] ORS;  Service: Orthopedics;  Laterality: Right;  . TOTAL HIP ARTHROPLASTY Left 02-08-2010  . TRANSURETHRAL RESECTION OF BLADDER TUMOR WITH GYRUS (TURBT-GYRUS) N/A 04/12/2013   Procedure: TRANSURETHRAL RESECTION OF BLADDER TUMOR WITH GYRUS (TURBT-GYRUS);  Surgeon: [name], MD;  Location: [facility];  Service: Urology;  Laterality: N/A;    Current Outpatient Medications  Medication Sig Dispense Refill  . ALPRAZolam (XANAX) 1 MG tablet Take 1/2 to 1 tab at hour of sleep 30 tablet 0  . bisoprolol-hydrochlorothiazide (ZIAC) 10-6.25 MG tablet TAKE 1 TABLET BY MOUTH ONCE DAILY 90 tablet 1  . CARTIA XT 180 MG 24 hr capsule TAKE 1 CAPSULE BY MOUTH ONCE DAILY 90 capsule 1  . Cholecalciferol (VITAMIN D PO) Take 5,000 Units by mouth 2 (two) times  daily.    . Multiple Vitamin (MULITIVITAMIN WITH MINERALS) TABS Take 1 tablet by mouth daily. FOR MEN    . rivaroxaban (XARELTO) 20 MG TABS tablet TAKE ONE TABLET BY MOUTH ONCE DAILY WITH SUPPER 90 tablet 0  . rosuvastatin (CRESTOR) 40 MG tablet TAKE 1 TABLET BY MOUTH ONCE DAILY 90 tablet 1  . NITROSTAT 0.4 MG SL tablet DISSOLVE ONE TABLET UNDER THE TONGUE EVERY 5 MINUTES AS NEEDED FOR CHEST PAIN.  DO NOT EXCEED A TOTAL OF 3 DOSES IN 15 MINUTES (Patient not taking: Reported on 05/17/2018) 25 tablet 6   No current facility-administered medications for this encounter.     Allergies  Allergen Reactions  . Zetia [Ezetimibe] Other (See Comments)    myalgias     Social History   Socioeconomic History  . Marital status: Married    Spouse name: Not on file  . Number of children: Not on file  . Years of education: Not on file  . Highest education level: Not on file  Occupational History  . Not on file  Social Needs  . Financial resource strain: Not on file  . Food insecurity:    Worry: Not on file    Inability: Not on file  . Transportation needs:    Medical: Not on file    Non-medical: Not on file  Tobacco  Use  . Smoking status: Former Smoker    Packs/day: 2.00    Years: 10.00    Pack years: 20.00    Types: Cigarettes    Last attempt to quit: 09/20/1981    Years since quitting: 36.6  . Smokeless tobacco: Never Used  Substance and Sexual Activity  . Alcohol use: No    Comment: pt states he has stopped drinking alcohol  . Drug use: No  . Sexual activity: Not on file  Lifestyle  . Physical activity:    Days per week: Not on file    Minutes per session: Not on file  . Stress: Not on file  Relationships  . Social connections:    Talks on phone: Not on file    Gets together: Not on file    Attends religious service: Not on file    Active member of club or organization: Not on file    Attends meetings of clubs or organizations: Not on file    Relationship status: Not on  file  . Intimate partner violence:    Fear of current or ex partner: Not on file    Emotionally abused: Not on file    Physically abused: Not on file    Forced sexual activity: Not on file  Other Topics Concern  . Not on file  Social History Narrative  . Not on file    Family History  Problem Relation Age of Onset  . Heart attack Mother   . Heart disease Mother   . Heart failure Father   . Heart disease Father     ROS- All systems are reviewed and negative except as per the HPI above  Physical Exam: Vitals:   05/17/18 1345  BP: 114/66  Pulse: 76  Weight: 93.9 kg  Height: 5\' 9"  (1.753 m)   Wt Readings from Last 3 Encounters:  05/17/18 93.9 kg  05/16/18 93 kg  03/25/18 94.8 kg    Labs: Lab Results  Component Value Date   NA 142 05/16/2018   K 3.5 05/16/2018   CL 110 05/16/2018   CO2 22 05/16/2018   GLUCOSE 98 05/16/2018   BUN 15 05/16/2018   CREATININE 0.83 05/16/2018   CALCIUM 9.1 05/16/2018   MG 2.2 12/08/2017   Lab Results  Component Value Date   INR 1.1 (H) 11/16/2013   Lab Results  Component Value Date   CHOL 186 03/25/2018   HDL 64 03/25/2018   LDLCALC 101 (H) 03/25/2018   TRIG 109 03/25/2018     GEN- The patient is well appearing, alert and oriented x 3 today.   Head- normocephalic, atraumatic Eyes-  Sclera clear, conjunctiva pink Ears- hearing intact Oropharynx- clear Neck- supple, no JVP Lymph- no cervical lymphadenopathy Lungs- Clear to ausculation bilaterally, normal work of breathing Heart-  irregular rate and rhythm, no murmurs, rubs or gallops, PMI not laterally displaced GI- soft, NT, ND, + BS Extremities- no clubbing, cyanosis, or edema MS- no significant deformity or atrophy Skin- no rash or lesion Psych- euthymic mood, full affect Neuro- strength and sensation are intact  EKG- afib at 76 bpm,qrs int 88 ms, qtc 454 ms Epic records reviewed  Assessment and Plan: 1. Paroxysmal afib Rate controlled Back in afib since late  last week Will schedule for cardioversion but will also schedule appointment with Dr. [name] to discuss ablataion Continue xarelto, chadsvasc score of at least 2, states no missed doses x 3 weeks Continue cardizem 180 mg daily /bisoprolol  2. HTN Stable  F/u with Dr. [name]  In one month  Geroge Baseman. Kennette Cuthrell, [facility] [address] [place], [place] [address] [phone]

## 2018-05-25 ENCOUNTER — Other Ambulatory Visit: Payer: Self-pay

## 2018-05-25 ENCOUNTER — Ambulatory Visit (HOSPITAL_COMMUNITY): Payer: Medicare Other | Admitting: Anesthesiology

## 2018-05-25 ENCOUNTER — Encounter (HOSPITAL_COMMUNITY)
Admission: RE | Disposition: A | Discharge status: Home / Self Care | Payer: Self-pay | Source: Ambulatory Visit | Attending: Cardiovascular Disease

## 2018-05-25 ENCOUNTER — Ambulatory Visit (HOSPITAL_COMMUNITY)
Admission: RE | Admit: 2018-05-25 | Discharge: 2018-05-25 | Disposition: A | Discharge status: Home / Self Care | Payer: Medicare Other | Source: Ambulatory Visit | Attending: Cardiovascular Disease | Admitting: Cardiovascular Disease

## 2018-05-25 ENCOUNTER — Encounter (HOSPITAL_COMMUNITY): Payer: Self-pay

## 2018-05-25 DIAGNOSIS — R7303 Prediabetes: Secondary | ICD-10-CM | POA: Diagnosis not present

## 2018-05-25 DIAGNOSIS — G4733 Obstructive sleep apnea (adult) (pediatric): Secondary | ICD-10-CM | POA: Insufficient documentation

## 2018-05-25 DIAGNOSIS — I10 Essential (primary) hypertension: Secondary | ICD-10-CM | POA: Insufficient documentation

## 2018-05-25 DIAGNOSIS — Z79899 Other long term (current) drug therapy: Secondary | ICD-10-CM | POA: Insufficient documentation

## 2018-05-25 DIAGNOSIS — Z9841 Cataract extraction status, right eye: Secondary | ICD-10-CM | POA: Insufficient documentation

## 2018-05-25 DIAGNOSIS — Z8249 Family history of ischemic heart disease and other diseases of the circulatory system: Secondary | ICD-10-CM | POA: Diagnosis not present

## 2018-05-25 DIAGNOSIS — E785 Hyperlipidemia, unspecified: Secondary | ICD-10-CM | POA: Insufficient documentation

## 2018-05-25 DIAGNOSIS — Z9842 Cataract extraction status, left eye: Secondary | ICD-10-CM | POA: Diagnosis not present

## 2018-05-25 DIAGNOSIS — I4819 Other persistent atrial fibrillation: Secondary | ICD-10-CM | POA: Diagnosis not present

## 2018-05-25 DIAGNOSIS — Z8551 Personal history of malignant neoplasm of bladder: Secondary | ICD-10-CM | POA: Insufficient documentation

## 2018-05-25 DIAGNOSIS — Z9889 Other specified postprocedural states: Secondary | ICD-10-CM | POA: Insufficient documentation

## 2018-05-25 DIAGNOSIS — Z888 Allergy status to other drugs, medicaments and biological substances status: Secondary | ICD-10-CM | POA: Insufficient documentation

## 2018-05-25 DIAGNOSIS — E559 Vitamin D deficiency, unspecified: Secondary | ICD-10-CM | POA: Insufficient documentation

## 2018-05-25 DIAGNOSIS — Z87891 Personal history of nicotine dependence: Secondary | ICD-10-CM | POA: Insufficient documentation

## 2018-05-25 DIAGNOSIS — Z7901 Long term (current) use of anticoagulants: Secondary | ICD-10-CM | POA: Insufficient documentation

## 2018-05-25 DIAGNOSIS — Z96643 Presence of artificial hip joint, bilateral: Secondary | ICD-10-CM | POA: Insufficient documentation

## 2018-05-25 DIAGNOSIS — H9193 Unspecified hearing loss, bilateral: Secondary | ICD-10-CM | POA: Diagnosis not present

## 2018-05-25 DIAGNOSIS — Z955 Presence of coronary angioplasty implant and graft: Secondary | ICD-10-CM | POA: Diagnosis not present

## 2018-05-25 DIAGNOSIS — I739 Peripheral vascular disease, unspecified: Secondary | ICD-10-CM | POA: Diagnosis not present

## 2018-05-25 DIAGNOSIS — I480 Paroxysmal atrial fibrillation: Secondary | ICD-10-CM | POA: Diagnosis present

## 2018-05-25 HISTORY — PX: CARDIOVERSION: SHX1299

## 2018-05-25 SURGERY — CARDIOVERSION
Anesthesia: General

## 2018-05-25 MED ORDER — SODIUM CHLORIDE 0.9 % IV SOLN
INTRAVENOUS | Status: DC | PRN
  Administered 2018-05-25: 14:00:00 via INTRAVENOUS

## 2018-05-25 MED ORDER — LIDOCAINE 2% (20 MG/ML) 5 ML SYRINGE
INTRAMUSCULAR | Status: DC | PRN
  Administered 2018-05-25: 60 mg via INTRAVENOUS

## 2018-05-25 MED ORDER — PROPOFOL 10 MG/ML IV BOLUS
INTRAVENOUS | Status: DC | PRN
  Administered 2018-05-25: 70 mg via INTRAVENOUS

## 2018-05-25 NOTE — Interval H&P Note (Signed)
History and Physical Interval Note:  05/25/2018 12:49 PM  Damon [name]  has presented today for surgery, with the diagnosis of A-FIB  The various methods of treatment have been discussed with the patient and family. After consideration of risks, benefits and other options for treatment, the patient has consented to  Procedure(s): CARDIOVERSION (N/A) as a surgical intervention .  The patient's history has been reviewed, patient examined, no change in status, stable for surgery.  I have reviewed the patient's chart and labs.  Questions were answered to the patient's satisfaction.     Skeet Latch, MD

## 2018-05-25 NOTE — Transfer of Care (Signed)
Immediate Anesthesia Transfer of Care Note  Patient: Damon [name]  Procedure(s) Performed: CARDIOVERSION (N/A )  Patient Location: PACU and Endoscopy Unit  Anesthesia Type:General  Level of Consciousness: awake and alert   Airway & Oxygen Therapy: Patient Spontanous Breathing and Patient connected to nasal cannula oxygen  Post-op Assessment: Report given to RN and Post -op Vital signs reviewed and stable  Post vital signs: Reviewed and stable  Last Vitals:  Vitals Value Taken Time  BP 107/63 05/25/2018  1:57 PM  Temp    Pulse 57 05/25/2018  2:01 PM  Resp 24 05/25/2018  2:01 PM  SpO2 93 % 05/25/2018  2:01 PM  Vitals shown include unvalidated device data.  Last Pain:  Vitals:   05/25/18 1235  TempSrc: Oral  PainSc:          Complications: No apparent anesthesia complications

## 2018-05-25 NOTE — Discharge Instructions (Signed)
Electrical Cardioversion, Care After °This sheet gives you information about how to care for yourself after your procedure. Your health care provider may also give you more specific instructions. If you have problems or questions, contact your health care provider. °What can I expect after the procedure? °After the procedure, it is common to have: °· Some redness on the skin where the shocks were given. ° °Follow these instructions at home: °· Do not drive for 24 hours if you were given a medicine to help you relax (sedative). °· Take over-the-counter and prescription medicines only as told by your health care provider. °· Ask your health care provider how to check your pulse. Check it often. °· Rest for 48 hours after the procedure or as told by your health care provider. °· Avoid or limit your caffeine use as told by your health care provider. °Contact a health care provider if: °· You feel like your heart is beating too quickly or your pulse is not regular. °· You have a serious muscle cramp that does not go away. °Get help right away if: °· You have discomfort in your chest. °· You are dizzy or you feel faint. °· You have trouble breathing or you are short of breath. °· Your speech is slurred. °· You have trouble moving an arm or leg on one side of your body. °· Your fingers or toes turn cold or blue. °This information is not intended to replace advice given to you by your health care provider. Make sure you discuss any questions you have with your health care provider. °Document Released: 04/27/2013 Document Revised: 02/08/2016 Document Reviewed: 01/11/2016 °Elsevier Interactive Patient Education © 2018 [employer] ° °

## 2018-05-25 NOTE — CV Procedure (Signed)
Electrical Cardioversion Procedure Note Damon [name] [id number] 18-Mar-1946  Procedure: Electrical Cardioversion Indications:  Atrial Fibrillation  Procedure Details Consent: Risks of procedure as well as the alternatives and risks of each were explained to the (patient/caregiver).  Consent for procedure obtained. Time Out: Verified patient identification, verified procedure, site/side was marked, verified correct patient position, special equipment/implants available, medications/allergies/relevent history reviewed, required imaging and test results available.  Performed  Patient placed on cardiac monitor, pulse oximetry, supplemental oxygen as necessary.  Sedation given: propofol Pacer pads placed anterior and posterior chest.  Cardioverted 1 time(s).  Cardioverted at 150J.  Evaluation Findings: Post procedure EKG shows: NSR with PVCs Complications: None Patient did tolerate procedure well.   Skeet Latch, MD 05/25/2018, 1:56 PM

## 2018-05-25 NOTE — Anesthesia Preprocedure Evaluation (Addendum)
Anesthesia Evaluation  Patient identified by MRN, date of birth, ID band Patient awake    Reviewed: Allergy & Precautions, NPO status , Patient's Chart, lab work & pertinent test results, reviewed documented beta blocker date and time   Airway Mallampati: II  TM Distance: >3 FB Neck ROM: Full    Dental  (+) Teeth Intact, Dental Advisory Given   Pulmonary sleep apnea and Continuous Positive Airway Pressure Ventilation , former smoker,    Pulmonary exam normal breath sounds clear to auscultation       Cardiovascular hypertension, Pt. on home beta blockers and Pt. on medications (-) angina+ CAD, + Cardiac Stents and + Peripheral Vascular Disease  (-) Past MI + dysrhythmias Atrial Fibrillation + Valvular Problems/Murmurs MR  Rhythm:Irregular Rate:Abnormal + Systolic murmurs 9/45/03 Stress Test: Nuclear stress EF: 61%. Blood pressure demonstrated a normal response to exercise. Horizontal ST segment depression ST segment depression was noted during stress in the V6 and V5 leads, beginning at 5 minutes of stress, and returning to baseline after less than 1 minute of recovery. The study is normal. This is a low risk study.   Neuro/Psych negative neurological ROS  negative psych ROS   GI/Hepatic Neg liver ROS, GERD  ,  Endo/Other  Obesity   Renal/GU negative Renal ROS   Bladder cancer     Musculoskeletal negative musculoskeletal ROS (+)   Abdominal   Peds  Hematology  (+) Blood dyscrasia (Xarelto), ,   Anesthesia Other Findings Day of surgery medications reviewed with the patient.  Reproductive/Obstetrics                            Anesthesia Physical Anesthesia Plan  ASA: III  Anesthesia Plan: General   Post-op Pain Management:    Induction: Intravenous  PONV Risk Score and Plan: 2 and Treatment may vary due to age or medical condition  Airway Management Planned:  Mask  Additional Equipment:   Intra-op Plan:   Post-operative Plan:   Informed Consent: I have reviewed the patients History and Physical, chart, labs and discussed the procedure including the risks, benefits and alternatives for the proposed anesthesia with the patient or authorized representative who has indicated his/her understanding and acceptance.   Dental advisory given  Plan Discussed with: CRNA  Anesthesia Plan Comments:         Anesthesia Quick Evaluation

## 2018-05-26 ENCOUNTER — Encounter (HOSPITAL_COMMUNITY): Payer: Self-pay | Admitting: Cardiovascular Disease

## 2018-05-26 NOTE — Anesthesia Postprocedure Evaluation (Signed)
Anesthesia Post Note  Patient: Damon [name]  Procedure(s) Performed: CARDIOVERSION (N/A )     Patient location during evaluation: PACU Anesthesia Type: General Level of consciousness: awake and alert, awake and oriented Pain management: pain level controlled Vital Signs Assessment: post-procedure vital signs reviewed and stable Respiratory status: spontaneous breathing, nonlabored ventilation and respiratory function stable Cardiovascular status: blood pressure returned to baseline and stable Postop Assessment: no apparent nausea or vomiting Anesthetic complications: no    Last Vitals:  Vitals:   05/25/18 1410 05/25/18 1415  BP: (!) 91/58 101/71  Pulse: (!) 55 (!) 52  Resp: 16 12  Temp:    SpO2: 95% 96%    Last Pain:  Vitals:   05/25/18 1405  TempSrc: Oral  PainSc: 0-No pain                 Catalina Gravel

## 2018-06-10 ENCOUNTER — Encounter: Payer: Self-pay | Admitting: Internal Medicine

## 2018-06-21 ENCOUNTER — Encounter: Payer: Self-pay | Admitting: Internal Medicine

## 2018-06-24 ENCOUNTER — Other Ambulatory Visit: Payer: Self-pay | Admitting: Internal Medicine

## 2018-06-24 MED ORDER — ALPRAZOLAM 1 MG PO TABS: ORAL_TABLET | ORAL | 0 refills | Status: DC

## 2018-06-28 ENCOUNTER — Encounter: Payer: Self-pay | Admitting: Internal Medicine

## 2018-06-28 ENCOUNTER — Ambulatory Visit (INDEPENDENT_AMBULATORY_CARE_PROVIDER_SITE_OTHER): Payer: Medicare Other | Admitting: Internal Medicine

## 2018-06-28 VITALS — BP 132/78 | HR 64 | Ht 69.0 in | Wt 209.8 lb

## 2018-06-28 DIAGNOSIS — I2510 Atherosclerotic heart disease of native coronary artery without angina pectoris: Secondary | ICD-10-CM | POA: Diagnosis not present

## 2018-06-28 DIAGNOSIS — I10 Essential (primary) hypertension: Secondary | ICD-10-CM

## 2018-06-28 DIAGNOSIS — I4819 Other persistent atrial fibrillation: Secondary | ICD-10-CM

## 2018-06-28 NOTE — Progress Notes (Signed)
Electrophysiology Office Note Date: 06/28/2018  ID:  Damon [name], DOB 1946-01-11, MRN [id number]  PCP: [name], MD Primary Cardiologist: Dr Angelena Form Electrophysiologist: Dr [name]  CC: Follow up for atrial fibrillation  Damon [name] is a 72 y.o. male seen today for routine electrophysiology followup. Patient had recent DCCV on 05/25/18 for an episode of atrial fibrillation lasting longer than a week. There were no triggering factors that the patient could identify. He was rate controlled at the time but did feel fatigued. He has had no symptoms of palpitations or fatigue since then. Since last being seen in our clinic, the patient reports doing very well.    He denies chest pain, palpitations, dyspnea, PND, orthopnea, nausea, vomiting, dizziness, syncope, edema, weight gain, or early satiety.  Past Medical History:  Diagnosis Date  . Bladder cancer ([place])   . Hearing loss of both ears   . History of colon polyps    BENIGN  . Hyperlipidemia   . Hypertension   . Mitral regurgitation   . OSA on CPAP    cpap settign of 13  . PAD (peripheral artery disease) ([facility])    ABI--  RIGHT SIDE NORMAL LEFT SIDE MODERATELY REDUCED W/ DISTAL LEFT SFA STENOSIS//  MILD CALDICATION  . PAF (paroxysmal atrial fibrillation) ([place])     a. s/p PVI 2014; b. s/p redo PVI and CTI 01/2015  . Pre-diabetes   . Vitamin D deficiency    Past Surgical History:  Procedure Laterality Date  . ABLATION OF DYSRHYTHMIC FOCUS  01/26/2015  . ATRIAL FIBRILLATION ABLATION N/A 07/27/2012   Procedure: ATRIAL FIBRILLATION ABLATION;  Surgeon: [name], MD;  Location: [facility] CATH LAB;  Service: Cardiovascular;  Laterality: N/A;  . CARDIAC ELECTROPHYSIOLOGY STUDY AND ABLATION  07-27-2012  DR Edenilson Austad   SUCCESSFUL ABLATION OF A-FIB  . CARDIOVERSION  08/06/2012   Procedure: CARDIOVERSION;  Surgeon: [name], MD;  Location: [facility] ENDOSCOPY;  Service: Cardiovascular;  Laterality: N/A;  . CARDIOVERSION N/A 08/25/2014    Procedure: CARDIOVERSION;  Surgeon: Larey Dresser, MD;  Location: [place];  Service: Cardiovascular;  Laterality: N/A;  . CARDIOVERSION N/A 11/13/2014   Procedure: CARDIOVERSION;  Surgeon: Sueanne Margarita, MD;  Location: [facility] ENDOSCOPY;  Service: Cardiovascular;  Laterality: N/A;  . CARDIOVERSION N/A 12/28/2014   Procedure: CARDIOVERSION;  Surgeon: [name], MD;  Location: [facility] ENDOSCOPY;  Service: Cardiovascular;  Laterality: N/A;  . CARDIOVERSION N/A 01/30/2017   Procedure: CARDIOVERSION;  Surgeon: Josue Hector, MD;  Location: [facility] ENDOSCOPY;  Service: Cardiovascular;  Laterality: N/A;  . CARDIOVERSION N/A 04/06/2017   Procedure: CARDIOVERSION;  Surgeon: Dorothy Spark, MD;  Location: [facility] ENDOSCOPY;  Service: Cardiovascular;  Laterality: N/A;  . CARDIOVERSION N/A 05/25/2018   Procedure: CARDIOVERSION;  Surgeon: Skeet Latch, MD;  Location: [place];  Service: Cardiovascular;  Laterality: N/A;  . CATARACT EXTRACTION W/ INTRAOCULAR LENS  IMPLANT, BILATERAL    . COLONOSCOPY WITH PROPOFOL N/A 02/13/2015   Procedure: COLONOSCOPY WITH PROPOFOL;  Surgeon: Garlan Fair, MD;  Location: [facility] ENDOSCOPY;  Service: Endoscopy;  Laterality: N/A;  . CYSTOSCOPY W/ RETROGRADES Bilateral 04/12/2013   Procedure: CYSTOSCOPY WITH RETROGRADE PYELOGRAM;  Surgeon: [name], MD;  Location: [facility];  Service: Urology;  Laterality: Bilateral;  . ELECTROPHYSIOLOGIC STUDY N/A 01/26/2015   Procedure: Atrial Fibrillation Ablation;  Surgeon: [name], MD;  Location: [place] CV LAB;  Service: Cardiovascular;  Laterality: N/A;  . LEFT HEART CATHETERIZATION WITH CORONARY ANGIOGRAM N/A 11/24/2013  Procedure: LEFT HEART CATHETERIZATION WITH CORONARY ANGIOGRAM;  Surgeon: [name], MD;  Location: [facility] CATH LAB;  Service: Cardiovascular;  Laterality: N/A;  . PERCUTANEOUS CORONARY ROTOBLATOR INTERVENTION (PCI-R) N/A 11/28/2013   Procedure: PERCUTANEOUS CORONARY ROTOBLATOR  INTERVENTION (PCI-R);  Surgeon: [name], MD;  Location: [facility] CATH LAB;  Service: Cardiovascular;  Laterality: N/A;  . PERCUTANEOUS CORONARY STENT INTERVENTION (PCI-S)  11/24/2013   Procedure: PERCUTANEOUS CORONARY STENT INTERVENTION (PCI-S);  Surgeon: [name], MD;  Location: [facility] CATH LAB;  Service: Cardiovascular;;  . TEE WITHOUT CARDIOVERSION  07/26/2012   Procedure: TRANSESOPHAGEAL ECHOCARDIOGRAM (TEE);  Surgeon: [name], MD;  Location: [facility] ENDOSCOPY;  Service: Cardiovascular;  Laterality: N/A;  . TEE WITHOUT CARDIOVERSION N/A 08/25/2014   Procedure: TRANSESOPHAGEAL ECHOCARDIOGRAM (TEE);  Surgeon: Larey Dresser, MD;  Location: [place];  Service: Cardiovascular;  Laterality: N/A;  . TEE WITHOUT CARDIOVERSION  01/26/2015   Procedure: Transesophageal Echocardiogram (Tee);  Surgeon: [name], MD;  Location: [place] CV LAB;  Service: Cardiovascular;;  . TONSILLECTOMY  age 53  . TOTAL HIP ARTHROPLASTY  07/25/2011   Procedure: TOTAL HIP ARTHROPLASTY ANTERIOR APPROACH;  Surgeon: Mcarthur Rossetti;  Location: [facility] ORS;  Service: Orthopedics;  Laterality: Right;  . TOTAL HIP ARTHROPLASTY Left 02-08-2010  . TRANSURETHRAL RESECTION OF BLADDER TUMOR WITH GYRUS (TURBT-GYRUS) N/A 04/12/2013   Procedure: TRANSURETHRAL RESECTION OF BLADDER TUMOR WITH GYRUS (TURBT-GYRUS);  Surgeon: [name], MD;  Location: [facility];  Service: Urology;  Laterality: N/A;    Current Outpatient Medications  Medication Sig Dispense Refill  . ALPRAZolam (XANAX) 1 MG tablet Take 1/2 to 1 tab at hour of sleep 30 tablet 0  . amoxicillin-clavulanate (AUGMENTIN) 875-125 MG tablet Take 1 tablet by mouth 2 (two) times daily.    . bisoprolol-hydrochlorothiazide (ZIAC) 10-6.25 MG tablet TAKE 1 TABLET BY MOUTH ONCE DAILY 90 tablet 1  . CARTIA XT 180 MG 24 hr capsule TAKE 1 CAPSULE BY MOUTH ONCE DAILY 90 capsule 1  . Cholecalciferol (VITAMIN D3) 5000 units CAPS Take 10,000 Units  by mouth daily.    . fluticasone (FLONASE) 50 MCG/ACT nasal spray Place 1-2 sprays into both nostrils daily.    . Multiple Vitamin (MULITIVITAMIN WITH MINERALS) TABS Take 1 tablet by mouth daily. FOR MEN    . NITROSTAT 0.4 MG SL tablet DISSOLVE ONE TABLET UNDER THE TONGUE EVERY 5 MINUTES AS NEEDED FOR CHEST PAIN.  DO NOT EXCEED A TOTAL OF 3 DOSES IN 15 MINUTES 25 tablet 6  . Polyvinyl Alcohol-Povidone (REFRESH OP) Place 1 drop into both eyes daily as needed (dry eyes).    . rivaroxaban (XARELTO) 20 MG TABS tablet TAKE ONE TABLET BY MOUTH ONCE DAILY WITH SUPPER 90 tablet 0  . rosuvastatin (CRESTOR) 20 MG tablet Take 20 mg by mouth every evening.     No current facility-administered medications for this visit.     Allergies:   Zetia [ezetimibe]   Social History: Social History   Socioeconomic History  . Marital status: Married    Spouse name: Not on file  . Number of children: Not on file  . Years of education: Not on file  . Highest education level: Not on file  Occupational History  . Not on file  Social Needs  . Financial resource strain: Not on file  . Food insecurity:    Worry: Not on file    Inability: Not on file  . Transportation needs:    Medical: Not on file  Non-medical: Not on file  Tobacco Use  . Smoking status: Former Smoker    Packs/day: 2.00    Years: 10.00    Pack years: 20.00    Types: Cigarettes    Last attempt to quit: 09/20/1981    Years since quitting: 36.7  . Smokeless tobacco: Never Used  Substance and Sexual Activity  . Alcohol use: No    Comment: pt states he has stopped drinking alcohol  . Drug use: No  . Sexual activity: Not on file  Lifestyle  . Physical activity:    Days per week: Not on file    Minutes per session: Not on file  . Stress: Not on file  Relationships  . Social connections:    Talks on phone: Not on file    Gets together: Not on file    Attends religious service: Not on file    Active member of club or organization: Not  on file    Attends meetings of clubs or organizations: Not on file    Relationship status: Not on file  . Intimate partner violence:    Fear of current or ex partner: Not on file    Emotionally abused: Not on file    Physically abused: Not on file    Forced sexual activity: Not on file  Other Topics Concern  . Not on file  Social History Narrative  . Not on file    Family History: Family History  Problem Relation Age of Onset  . Heart attack Mother   . Heart disease Mother   . Heart failure Father   . Heart disease Father     Review of Systems: All other systems reviewed and are otherwise negative except as noted above.   Physical Exam: VS:  BP 132/78   Pulse 64   Ht 5\' 9"  (1.753 m)   Wt 209 lb 12.8 oz (95.2 kg)   SpO2 97%   BMI 30.98 kg/m  , BMI Body mass index is 30.98 kg/m. Wt Readings from Last 3 Encounters:  06/28/18 209 lb 12.8 oz (95.2 kg)  05/25/18 205 lb (93 kg)  05/17/18 207 lb (93.9 kg)    GEN- The patient is well appearing, alert and oriented x 3 today.   HEENT: normocephalic, atraumatic; sclera clear, conjunctiva pink; hearing intact; oropharynx clear; neck supple, no JVP Lungs- Clear to ausculation bilaterally, normal work of breathing.  No wheezes, rales, rhonchi Heart- Regular rate and rhythm, no murmurs, rubs or gallops Extremities- no clubbing, cyanosis, or edema MS- no significant deformity or atrophy Skin- warm and dry, no rash or lesion  Psych- euthymic mood, full affect Neuro- strength and sensation are intact   EKG:  EKG is ordered today. The ekg ordered today shows sinus rhythm HR 64, PR 168, QRS 86, QTc 449  Recent Labs: 12/08/2017: Magnesium 2.2 03/25/2018: ALT 38; TSH 1.00 05/16/2018: BUN 15; Creatinine, Ser 0.83; Hemoglobin 15.7; Platelets 215; Potassium 3.5; Sodium 142    Other studies Reviewed: Additional studies/ records that were reviewed today include: Afib clinic notes  Assessment and Plan:  1. Persistent atrial  fibrillation S/p afib ablation 2014 and 2016. Also s/p DCCV 05/25/18. Continue diltiazem 180mg  daily CHADS2VASC score of at least 3. Continue xarelto 20mg  daily We discussed therapeutic options including afib ablation. Given the infrequency of his atrial fibrillation, we will hold off on pursuing ablation for the time being. If his episodes begin to happen more frequently we will revisit ablation again. Patient in agreement with the  plan of care. Will get an updated echocardiogram.  2. HTN Stable, no change today  3. OSA Compliant with CPAP   Current medicines are reviewed at length with the patient today.   The patient does not have concerns regarding his medicines.  The following changes were made today: none  Labs/ tests ordered today include:  Orders Placed This Encounter  Procedures  . EKG 12-Lead  . ECHOCARDIOGRAM COMPLETE     Disposition:   Follow up with me in 6 months   Army Fossa MD 06/28/2018 9:37 AM   [facility] [address] [place] [address] [phone] (office) [phone] (fax)

## 2018-06-28 NOTE — Patient Instructions (Signed)
Medication Instructions:  Your physician recommends that you continue on your current medications as directed. Please refer to the Current Medication list given to you today.  Labwork: None ordered.  Testing/Procedures: Your physician has requested that you have an echocardiogram. Echocardiography is a painless test that uses sound waves to create images of your heart. It provides your doctor with information about the size and shape of your heart and how well your heart's chambers and valves are working. This procedure takes approximately one hour. There are no restrictions for this procedure.   Follow-Up: Your physician recommends that you schedule a follow-up appointment in:   6 months with Dr [name]  Any Other Special Instructions Will Be Listed Below (If Applicable).     If you need a refill on your cardiac medications before your next appointment, please call your pharmacy.

## 2018-06-28 NOTE — Patient Instructions (Signed)

## 2018-06-28 NOTE — Progress Notes (Signed)
[facility]   [name], M.D.     Damon [name]. Silverio Lay, P.A.-C Liane Comber, [place]                [address] [place], [place] [address] Telephone [phone] Telefax [phone] Annual  Screening/Preventative Visit  & Comprehensive Evaluation & Examination     This very nice 72 y.o. MWM presents for a Screening /Preventative Visit & comprehensive evaluation and management of multiple medical co-morbidities.  Patient has been followed for HTN, ASCAD/pAfib, HLD, Prediabetes and Vitamin D Deficiency.Patient has hx/o OSA and was mask intolerant.      HTN predates circa 1996. In 2015, he had PCA/Stent then Rotator Ablation. In 2014 and 2016 he had RFA x 2 for Afib and a CV in Sept 2018.   Patient  recently had Afib lasting over 1 week & did require another CV on 05/25/2018 by Dr Darene Lamer. Oval Linsey.  Patient is on Xarelto for CHADS2VASC= 3. Today's BP is at goal: 116/64. Patient denies any cardiac symptoms as chest pain, palpitations, shortness of breath, dizziness or ankle swelling.     Patient's hyperlipidemia is near controlled with diet and medications. Patient denies myalgias or other medication SE's. Last lipids were near goal: Lab Results  Component Value Date   CHOL 186 03/25/2018   HDL 64 03/25/2018   LDLCALC 101 (H) 03/25/2018   TRIG 109 03/25/2018   CHOLHDL 2.9 03/25/2018      Patient has hx/o prediabetes (A1c 5.7% / 2011 & 5.9% / 2013)  and patient denies reactive hypoglycemic symptoms, visual blurring, diabetic polys or paresthesias. Last A1c was Normal & at goal: Lab Results  Component Value Date   HGBA1C 5.4 03/25/2018       Finally, patient has history of Vitamin D Deficiency ("27" / 2008)  and last vitamin D was at goal: Lab Results  Component Value Date   VD25OH 86 12/08/2017   Current Outpatient Medications on File Prior to Visit  Medication Sig  . ALPRAZolam (XANAX) 1 MG  tablet Take 1/2 to 1 tab at hour of sleep  . amoxicillin-clavulanate (AUGMENTIN) 875-125 MG tablet Take 1 tablet by mouth 2 (two) times daily.  . bisoprolol-hydrochlorothiazide (ZIAC) 10-6.25 MG tablet TAKE 1 TABLET BY MOUTH ONCE DAILY  . CARTIA XT 180 MG 24 hr capsule TAKE 1 CAPSULE BY MOUTH ONCE DAILY  . Cholecalciferol (VITAMIN D3) 5000 units CAPS Take 10,000 Units by mouth daily.  . fluticasone (FLONASE) 50 MCG/ACT nasal spray Place 1-2 sprays into both nostrils daily.  . Multiple Vitamin (MULITIVITAMIN WITH MINERALS) TABS Take 1 tablet by mouth daily. FOR MEN  . NITROSTAT 0.4 MG SL tablet DISSOLVE ONE TABLET UNDER THE TONGUE EVERY 5 MINUTES AS NEEDED FOR CHEST PAIN.  DO NOT EXCEED A TOTAL OF 3 DOSES IN 15 MINUTES  . Polyvinyl Alcohol-Povidone (REFRESH OP) Place 1 drop into both eyes daily as needed (dry eyes).  . rivaroxaban (XARELTO) 20 MG TABS tablet TAKE ONE TABLET BY MOUTH ONCE DAILY WITH SUPPER  . rosuvastatin (CRESTOR) 20 MG tablet Take 20 mg by mouth every evening.   No current facility-administered medications on file prior to visit.    Allergies  Allergen Reactions  . Zetia [Ezetimibe] Other (See Comments)    myalgias    Past Medical History:  Diagnosis Date  . Bladder cancer ([place])   . Hearing  loss of both ears   . History of colon polyps    BENIGN  . Hyperlipidemia   . Hypertension   . Mitral regurgitation   . OSA on CPAP    cpap settign of 13  . PAD (peripheral artery disease) ([facility])    ABI--  RIGHT SIDE NORMAL LEFT SIDE MODERATELY REDUCED W/ DISTAL LEFT SFA STENOSIS//  MILD CALDICATION  . PAF (paroxysmal atrial fibrillation) ([facility])     a. s/p PVI 2014; b. s/p redo PVI and CTI 01/2015  . Pre-diabetes   . Vitamin D deficiency    Health Maintenance  Topic Date Due  . Hepatitis C Screening  Dec 05, 1945  . COLONOSCOPY  04/22/2021  . TETANUS/TDAP  09/23/2023  . INFLUENZA VACCINE  Completed  . PNA vac Low Risk Adult  Completed   Immunization History  Administered  Date(s) Administered  . DT 07/22/2003, 12/28/2013  . Influenza Whole 05/19/2013  . Influenza, High Dose Seasonal PF 04/08/2017  . Influenza,inj,Quad PF,6+ Mos 05/09/2014  . Influenza-Unspecified 05/14/2015, 05/01/2016, 05/17/2018  . Pneumococcal Conjugate-13 07/11/2014  . Pneumococcal Polysaccharide-23 12/04/2009, 06/10/2016  . Tdap 09/22/2013   Last Colon - 12/25/2009  Past Surgical History:  Procedure Laterality Date  . ABLATION OF DYSRHYTHMIC FOCUS  01/26/2015  . ATRIAL FIBRILLATION ABLATION N/A 07/27/2012   Procedure: ATRIAL FIBRILLATION ABLATION;  Surgeon: [name], MD;  Location: [facility] CATH LAB;  Service: Cardiovascular;  Laterality: N/A;  . CARDIAC ELECTROPHYSIOLOGY STUDY AND ABLATION  07-27-2012  DR ALLRED   SUCCESSFUL ABLATION OF A-FIB  . CARDIOVERSION  08/06/2012   Procedure: CARDIOVERSION;  Surgeon: [name], MD;  Location: [facility] ENDOSCOPY;  Service: Cardiovascular;  Laterality: N/A;  . CARDIOVERSION N/A 08/25/2014   Procedure: CARDIOVERSION;  Surgeon: Larey Dresser, MD;  Location: [facility];  Service: Cardiovascular;  Laterality: N/A;  . CARDIOVERSION N/A 11/13/2014   Procedure: CARDIOVERSION;  Surgeon: Sueanne Margarita, MD;  Location: [facility] ENDOSCOPY;  Service: Cardiovascular;  Laterality: N/A;  . CARDIOVERSION N/A 12/28/2014   Procedure: CARDIOVERSION;  Surgeon: [name], MD;  Location: [facility] ENDOSCOPY;  Service: Cardiovascular;  Laterality: N/A;  . CARDIOVERSION N/A 01/30/2017   Procedure: CARDIOVERSION;  Surgeon: Josue Hector, MD;  Location: [facility] ENDOSCOPY;  Service: Cardiovascular;  Laterality: N/A;  . CARDIOVERSION N/A 04/06/2017   Procedure: CARDIOVERSION;  Surgeon: Dorothy Spark, MD;  Location: [facility] ENDOSCOPY;  Service: Cardiovascular;  Laterality: N/A;  . CARDIOVERSION N/A 05/25/2018   Procedure: CARDIOVERSION;  Surgeon: Skeet Latch, MD;  Location: [facility];  Service: Cardiovascular;  Laterality: N/A;  . CATARACT EXTRACTION W/ INTRAOCULAR LENS  IMPLANT,  BILATERAL    . COLONOSCOPY WITH PROPOFOL N/A 02/13/2015   Procedure: COLONOSCOPY WITH PROPOFOL;  Surgeon: Garlan Fair, MD;  Location: [facility] ENDOSCOPY;  Service: Endoscopy;  Laterality: N/A;  . CYSTOSCOPY W/ RETROGRADES Bilateral 04/12/2013   Procedure: CYSTOSCOPY WITH RETROGRADE PYELOGRAM;  Surgeon: [name], MD;  Location: [facility];  Service: Urology;  Laterality: Bilateral;  . ELECTROPHYSIOLOGIC STUDY N/A 01/26/2015   Procedure: Atrial Fibrillation Ablation;  Surgeon: [name], MD;  Location: [place] CV LAB;  Service: Cardiovascular;  Laterality: N/A;  . LEFT HEART CATHETERIZATION WITH CORONARY ANGIOGRAM N/A 11/24/2013   Procedure: LEFT HEART CATHETERIZATION WITH CORONARY ANGIOGRAM;  Surgeon: [name], MD;  Location: [facility] CATH LAB;  Service: Cardiovascular;  Laterality: N/A;  . PERCUTANEOUS CORONARY ROTOBLATOR INTERVENTION (PCI-R) N/A 11/28/2013   Procedure: PERCUTANEOUS CORONARY ROTOBLATOR INTERVENTION (PCI-R);  Surgeon: [name], MD;  Location: [facility]  CATH LAB;  Service: Cardiovascular;  Laterality: N/A;  . PERCUTANEOUS CORONARY STENT INTERVENTION (PCI-S)  11/24/2013   Procedure: PERCUTANEOUS CORONARY STENT INTERVENTION (PCI-S);  Surgeon: [name], MD;  Location: [facility] CATH LAB;  Service: Cardiovascular;;  . TEE WITHOUT CARDIOVERSION  07/26/2012   Procedure: TRANSESOPHAGEAL ECHOCARDIOGRAM (TEE);  Surgeon: [name], MD;  Location: [facility] ENDOSCOPY;  Service: Cardiovascular;  Laterality: N/A;  . TEE WITHOUT CARDIOVERSION N/A 08/25/2014   Procedure: TRANSESOPHAGEAL ECHOCARDIOGRAM (TEE);  Surgeon: Larey Dresser, MD;  Location: [place];  Service: Cardiovascular;  Laterality: N/A;  . TEE WITHOUT CARDIOVERSION  01/26/2015   Procedure: Transesophageal Echocardiogram (Tee);  Surgeon: [name], MD;  Location: [place] CV LAB;  Service: Cardiovascular;;  . TONSILLECTOMY  age 77  . TOTAL HIP ARTHROPLASTY  07/25/2011   Procedure: TOTAL  HIP ARTHROPLASTY ANTERIOR APPROACH;  Surgeon: Mcarthur Rossetti;  Location: [facility] ORS;  Service: Orthopedics;  Laterality: Right;  . TOTAL HIP ARTHROPLASTY Left 02-08-2010  . TRANSURETHRAL RESECTION OF BLADDER TUMOR WITH GYRUS (TURBT-GYRUS) N/A 04/12/2013   Procedure: TRANSURETHRAL RESECTION OF BLADDER TUMOR WITH GYRUS (TURBT-GYRUS);  Surgeon: [name], MD;  Location: [facility];  Service: Urology;  Laterality: N/A;   Family History  Problem Relation Age of Onset  . Heart attack Mother   . Heart disease Mother   . Heart failure Father   . Heart disease Father    Social History   Socioeconomic History  . Marital status: Married    Spouse name: Vinnie Level  . Number of children: 1 son & 1 daughter  Occupational History  . retired  Tobacco Use  . Smoking status: Former Smoker    Packs/day: 2.00    Years: 10.00    Pack years: 20.00    Types: Cigarettes    Last attempt to quit: 09/20/1981    Years since quitting: 36.7  . Smokeless tobacco: Never Used  Substance and Sexual Activity  . Alcohol use: No    Comment: pt states he has stopped drinking alcohol  . Drug use: No  . Sexual activity: Active    ROS Constitutional: Denies fever, chills, weight loss/gain, headaches, insomnia,  night sweats or change in appetite. Does c/o fatigue. Eyes: Denies redness, blurred vision, diplopia, discharge, itchy or watery eyes.  ENT: Denies discharge, congestion, post nasal drip, epistaxis, sore throat, earache, hearing loss, dental pain, Tinnitus, Vertigo, Sinus pain or snoring.  Cardio: Denies chest pain, palpitations, irregular heartbeat, syncope, dyspnea, diaphoresis, orthopnea, PND, claudication or edema Respiratory: denies cough, dyspnea, DOE, pleurisy, hoarseness, laryngitis or wheezing.  Gastrointestinal: Denies dysphagia, heartburn, reflux, water brash, pain, cramps, nausea, vomiting, bloating, diarrhea, constipation, hematemesis, melena, hematochezia, jaundice or  hemorrhoids Genitourinary: Denies dysuria, frequency, urgency, nocturia, hesitancy, discharge, hematuria or flank pain Musculoskeletal: Denies arthralgia, myalgia, stiffness, Jt. Swelling, pain, limp or strain/sprain. Denies Falls. Skin: Denies puritis, rash, hives, warts, acne, eczema or change in skin lesion Neuro: No weakness, tremor, incoordination, spasms, paresthesia or pain Psychiatric: Denies confusion, memory loss or sensory loss. Denies Depression. Endocrine: Denies change in weight, skin, hair change, nocturia, and paresthesia, diabetic polys, visual blurring or hyper / hypo glycemic episodes.  Heme/Lymph: No excessive bleeding, bruising or enlarged lymph nodes.  Physical Exam  BP 116/64   Pulse 68   Temp (!) 97.5 F (36.4 C)   Resp 16   Ht 5\' 9"  (1.753 m)   Wt 208 lb (94.3 kg)   BMI 30.72 kg/m   General Appearance: Well nourished and well  groomed and in no apparent distress.  Eyes: PERRLA, EOMs, conjunctiva no swelling or erythema, normal fundi and vessels. Sinuses: No frontal/maxillary tenderness ENT/Mouth: EACs patent / TMs  nl. Nares clear without erythema, swelling, mucoid exudates. Oral hygiene is good. No erythema, swelling, or exudate. Tongue normal, non-obstructing. Tonsils not swollen or erythematous. Hearing normal.  Neck: Supple, thyroid not palpable. No bruits, nodes or JVD. Respiratory: Respiratory effort normal.  BS equal and clear bilateral without rales, rhonci, wheezing or stridor. Cardio: Heart sounds are normal with regular rate and rhythm and no murmurs, rubs or gallops. Peripheral pulses are normal and equal bilaterally without edema. No aortic or femoral bruits. Chest: symmetric with normal excursions and percussion.  Abdomen: Soft, with Nl bowel sounds. Nontender, no guarding, rebound, hernias, masses, or organomegaly.  Lymphatics: Non tender without lymphadenopathy.  Genitourinary: No hernias.Testes nl. DRE - prostate nl for age - smooth & firm w/o  nodules. Musculoskeletal: Full ROM all peripheral extremities, joint stability, 5/5 strength, and normal gait. Skin: Warm and dry without rashes, lesions, cyanosis, clubbing or  ecchymosis.  Neuro: Cranial nerves intact, reflexes equal bilaterally. Normal muscle tone, no cerebellar symptoms. Sensation intact.  Pysch: Alert and oriented X 3 with normal affect, insight and judgment appropriate.   Assessment and Plan  1. Annual Preventative/Screening Exam   2. Essential hypertension  - [place], RETROPERITNL ABD,  LTD - Urinalysis, Routine w reflex microscopic - Microalbumin / creatinine urine ratio - CBC with Differential/Platelet - COMPLETE METABOLIC PANEL WITH GFR - Magnesium - TSH  3. Hyperlipidemia, mixed  - [place], RETROPERITNL ABD,  LTD - Lipid panel - TSH  4. Vitamin D deficiency  - VITAMIN D 25 Hydroxyl  5. PAF (paroxysmal atrial fibrillation) ([facility])  - TSH  6. Atherosclerosis of native coronary artery without angina pectoris  - [place], RETROPERITNL ABD,  LTD - Lipid panel  7. OSA on CPAP   8. PAD (peripheral artery disease) ([facility])  - [place], RETROPERITNL ABD,  LTD  9. BPH with obstruction/lower urinary tract symptoms  - PSA  10. Screening for colorectal cancer  - POC Hemoccult Bld/Stl (3-Cd Home Screen); Future  11. History of colon polyps  - POC Hemoccult Bld/Stl  12. Abnormal glucose  - [place], RETROPERITNL ABD,  LTD - Hemoglobin A1c - Insulin, random  13. Prediabetes  - Hemoglobin A1c - Insulin, random  14. Prostate cancer screening  - PSA  15. Screening for ischemic heart disease  - [place], RETROPERITNL ABD,  LTD  16. FHx: heart disease  - [place], RETROPERITNL ABD,  LTD  17. Smoker  - [place], RETROPERITNL ABD,  LTD  18. Malignant neoplasm of urinary bladder (HCC)   19. Screening for AAA (aortic abdominal aneurysm)  - [place], RETROPERITNL ABD,  LTD  20. Medication management  - Urinalysis, Routine w reflex microscopic - Microalbumin / creatinine urine  ratio - CBC with Differential/Platelet - COMPLETE METABOLIC PANEL WITH GFR - Magnesium - Lipid panel - TSH - Hemoglobin A1c - Insulin, random - VITAMIN D 25 Hydroxyl        Patient was counseled in prudent diet, weight control to achieve/maintain BMI less than 25, BP monitoring, regular exercise and medications as discussed.  Discussed med effects and SE's. Routine screening labs and tests as requested with regular follow-up as recommended. Over 40 minutes of exam, counseling, chart review and high complex critical decision making was performed

## 2018-06-29 ENCOUNTER — Ambulatory Visit (INDEPENDENT_AMBULATORY_CARE_PROVIDER_SITE_OTHER): Payer: Medicare Other | Admitting: Internal Medicine

## 2018-06-29 VITALS — BP 116/64 | HR 68 | Temp 97.5°F | Resp 16 | Ht 69.0 in | Wt 208.0 lb

## 2018-06-29 DIAGNOSIS — E559 Vitamin D deficiency, unspecified: Secondary | ICD-10-CM

## 2018-06-29 DIAGNOSIS — Z1212 Encounter for screening for malignant neoplasm of rectum: Secondary | ICD-10-CM

## 2018-06-29 DIAGNOSIS — I739 Peripheral vascular disease, unspecified: Secondary | ICD-10-CM

## 2018-06-29 DIAGNOSIS — Z860100 Personal history of colon polyps, unspecified: Secondary | ICD-10-CM

## 2018-06-29 DIAGNOSIS — I2510 Atherosclerotic heart disease of native coronary artery without angina pectoris: Secondary | ICD-10-CM

## 2018-06-29 DIAGNOSIS — Z8249 Family history of ischemic heart disease and other diseases of the circulatory system: Secondary | ICD-10-CM

## 2018-06-29 DIAGNOSIS — F17200 Nicotine dependence, unspecified, uncomplicated: Secondary | ICD-10-CM

## 2018-06-29 DIAGNOSIS — I10 Essential (primary) hypertension: Secondary | ICD-10-CM | POA: Diagnosis not present

## 2018-06-29 DIAGNOSIS — Z9989 Dependence on other enabling machines and devices: Secondary | ICD-10-CM

## 2018-06-29 DIAGNOSIS — N401 Enlarged prostate with lower urinary tract symptoms: Secondary | ICD-10-CM

## 2018-06-29 DIAGNOSIS — N138 Other obstructive and reflux uropathy: Secondary | ICD-10-CM

## 2018-06-29 DIAGNOSIS — Z0001 Encounter for general adult medical examination with abnormal findings: Secondary | ICD-10-CM

## 2018-06-29 DIAGNOSIS — Z79899 Other long term (current) drug therapy: Secondary | ICD-10-CM

## 2018-06-29 DIAGNOSIS — E782 Mixed hyperlipidemia: Secondary | ICD-10-CM

## 2018-06-29 DIAGNOSIS — Z136 Encounter for screening for cardiovascular disorders: Secondary | ICD-10-CM

## 2018-06-29 DIAGNOSIS — Z0000 Encounter for general adult medical examination without abnormal findings: Secondary | ICD-10-CM | POA: Diagnosis not present

## 2018-06-29 DIAGNOSIS — C679 Malignant neoplasm of bladder, unspecified: Secondary | ICD-10-CM

## 2018-06-29 DIAGNOSIS — Z1211 Encounter for screening for malignant neoplasm of colon: Secondary | ICD-10-CM

## 2018-06-29 DIAGNOSIS — R7303 Prediabetes: Secondary | ICD-10-CM

## 2018-06-29 DIAGNOSIS — R7309 Other abnormal glucose: Secondary | ICD-10-CM

## 2018-06-29 DIAGNOSIS — I480 Paroxysmal atrial fibrillation: Secondary | ICD-10-CM

## 2018-06-29 DIAGNOSIS — G4733 Obstructive sleep apnea (adult) (pediatric): Secondary | ICD-10-CM

## 2018-06-29 DIAGNOSIS — Z125 Encounter for screening for malignant neoplasm of prostate: Secondary | ICD-10-CM

## 2018-06-30 LAB — COMPLETE METABOLIC PANEL WITH GFR
AG Ratio: 1.9 (calc) (ref 1.0–2.5)
ALBUMIN MSPROF: 4.5 g/dL (ref 3.6–5.1)
ALT: 35 U/L (ref 9–46)
AST: 28 U/L (ref 10–35)
Alkaline phosphatase (APISO): 49 U/L (ref 40–115)
BUN: 15 mg/dL (ref 7–25)
CO2: 27 mmol/L (ref 20–32)
Calcium: 9.7 mg/dL (ref 8.6–10.3)
Chloride: 106 mmol/L (ref 98–110)
Creat: 0.79 mg/dL (ref 0.70–1.18)
GFR, Est African American: 103 mL/min/{1.73_m2} (ref >=60)
GFR, Est Non African American: 89 mL/min/{1.73_m2} (ref >=60)
GLOBULIN: 2.4 g/dL (ref 1.9–3.7)
Glucose, Bld: 83 mg/dL (ref 65–99)
Potassium: 3.9 mmol/L (ref 3.5–5.3)
Sodium: 142 mmol/L (ref 135–146)
Total Bilirubin: 1.7 mg/dL — ABNORMAL HIGH (ref 0.2–1.2)
Total Protein: 6.9 g/dL (ref 6.1–8.1)

## 2018-06-30 LAB — MAGNESIUM: Magnesium: 2 mg/dL (ref 1.5–2.5)

## 2018-06-30 LAB — LIPID PANEL
Cholesterol: 163 mg/dL (ref <200)
HDL: 60 mg/dL (ref >40)
LDL Cholesterol (Calc): 86 mg/dL (calc)
Non-HDL Cholesterol (Calc): 103 mg/dL (calc) (ref <130)
Total CHOL/HDL Ratio: 2.7 (calc) (ref <5.0)
Triglycerides: 77 mg/dL (ref <150)

## 2018-06-30 LAB — CBC WITH DIFFERENTIAL/PLATELET
BASOS PCT: 0.8 %
Basophils Absolute: 51 cells/uL (ref 0–200)
Eosinophils Absolute: 109 cells/uL (ref 15–500)
Eosinophils Relative: 1.7 %
HCT: 45.9 % (ref 38.5–50.0)
Hemoglobin: 15.7 g/dL (ref 13.2–17.1)
Lymphs Abs: 1510 cells/uL (ref 850–3900)
MCH: 30.3 pg (ref 27.0–33.0)
MCHC: 34.2 g/dL (ref 32.0–36.0)
MCV: 88.4 fL (ref 80.0–100.0)
MPV: 9.7 fL (ref 7.5–12.5)
Monocytes Relative: 8 %
Neutro Abs: 4218 cells/uL (ref 1500–7800)
Neutrophils Relative %: 65.9 %
Platelets: 214 10*3/uL (ref 140–400)
RBC: 5.19 10*6/uL (ref 4.20–5.80)
RDW: 12.4 % (ref 11.0–15.0)
TOTAL LYMPHOCYTE: 23.6 %
WBC: 6.4 10*3/uL (ref 3.8–10.8)
WBCMIX: 512 {cells}/uL (ref 200–950)

## 2018-06-30 LAB — URINALYSIS, ROUTINE W REFLEX MICROSCOPIC
Bilirubin Urine: NEGATIVE
GLUCOSE, UA: NEGATIVE
Hgb urine dipstick: NEGATIVE
KETONES UR: NEGATIVE
LEUKOCYTES UA: NEGATIVE
NITRITE: NEGATIVE
Protein, ur: NEGATIVE
Specific Gravity, Urine: 1.011 (ref 1.001–1.03)
pH: 6.5 (ref 5.0–8.0)

## 2018-06-30 LAB — HEMOGLOBIN A1C
Hgb A1c MFr Bld: 5.3 % of total Hgb (ref <5.7)
Mean Plasma Glucose: 105 (calc)
eAG (mmol/L): 5.8 (calc)

## 2018-06-30 LAB — VITAMIN D 25 HYDROXY (VIT D DEFICIENCY, FRACTURES): Vit D, 25-Hydroxy: 108 ng/mL — ABNORMAL HIGH (ref 30–100)

## 2018-06-30 LAB — PSA: PSA: 0.7 ng/mL (ref <=4.0)

## 2018-06-30 LAB — MICROALBUMIN / CREATININE URINE RATIO
Creatinine, Urine: 77 mg/dL (ref 20–320)
Microalb, Ur: <0.2 mg/dL

## 2018-06-30 LAB — INSULIN, RANDOM: Insulin: 6 u[IU]/mL (ref 2.0–19.6)

## 2018-06-30 LAB — TSH: TSH: 0.72 mIU/L (ref 0.40–4.50)

## 2018-07-04 ENCOUNTER — Encounter: Payer: Self-pay | Admitting: Internal Medicine

## 2018-07-06 ENCOUNTER — Ambulatory Visit (HOSPITAL_COMMUNITY): Payer: Medicare Other | Attending: Cardiology

## 2018-07-06 ENCOUNTER — Other Ambulatory Visit: Payer: Self-pay

## 2018-07-06 DIAGNOSIS — I2510 Atherosclerotic heart disease of native coronary artery without angina pectoris: Secondary | ICD-10-CM | POA: Diagnosis present

## 2018-07-06 DIAGNOSIS — I4819 Other persistent atrial fibrillation: Secondary | ICD-10-CM

## 2018-07-06 DIAGNOSIS — I10 Essential (primary) hypertension: Secondary | ICD-10-CM

## 2018-07-30 ENCOUNTER — Other Ambulatory Visit: Payer: Self-pay | Admitting: Internal Medicine

## 2018-08-02 ENCOUNTER — Encounter (HOSPITAL_COMMUNITY): Payer: Self-pay | Admitting: Nurse Practitioner

## 2018-08-02 ENCOUNTER — Ambulatory Visit (HOSPITAL_COMMUNITY)
Admission: RE | Admit: 2018-08-02 | Discharge: 2018-08-02 | Disposition: A | Discharge status: Home / Self Care | Payer: Medicare Other | Source: Ambulatory Visit | Attending: Nurse Practitioner | Admitting: Nurse Practitioner

## 2018-08-02 ENCOUNTER — Encounter (HOSPITAL_COMMUNITY): Payer: Self-pay | Admitting: *Deleted

## 2018-08-02 ENCOUNTER — Other Ambulatory Visit (HOSPITAL_COMMUNITY): Payer: Self-pay | Admitting: *Deleted

## 2018-08-02 ENCOUNTER — Other Ambulatory Visit: Payer: Self-pay

## 2018-08-02 VITALS — BP 112/64 | HR 100 | Ht 69.0 in | Wt 206.8 lb

## 2018-08-02 DIAGNOSIS — Z8249 Family history of ischemic heart disease and other diseases of the circulatory system: Secondary | ICD-10-CM | POA: Insufficient documentation

## 2018-08-02 DIAGNOSIS — E785 Hyperlipidemia, unspecified: Secondary | ICD-10-CM | POA: Diagnosis not present

## 2018-08-02 DIAGNOSIS — I480 Paroxysmal atrial fibrillation: Secondary | ICD-10-CM | POA: Insufficient documentation

## 2018-08-02 DIAGNOSIS — G4733 Obstructive sleep apnea (adult) (pediatric): Secondary | ICD-10-CM | POA: Diagnosis not present

## 2018-08-02 DIAGNOSIS — Z888 Allergy status to other drugs, medicaments and biological substances status: Secondary | ICD-10-CM | POA: Diagnosis not present

## 2018-08-02 DIAGNOSIS — Z96641 Presence of right artificial hip joint: Secondary | ICD-10-CM | POA: Diagnosis not present

## 2018-08-02 DIAGNOSIS — I4819 Other persistent atrial fibrillation: Secondary | ICD-10-CM

## 2018-08-02 DIAGNOSIS — I340 Nonrheumatic mitral (valve) insufficiency: Secondary | ICD-10-CM | POA: Diagnosis not present

## 2018-08-02 DIAGNOSIS — I10 Essential (primary) hypertension: Secondary | ICD-10-CM | POA: Diagnosis not present

## 2018-08-02 DIAGNOSIS — E559 Vitamin D deficiency, unspecified: Secondary | ICD-10-CM | POA: Diagnosis not present

## 2018-08-02 DIAGNOSIS — I739 Peripheral vascular disease, unspecified: Secondary | ICD-10-CM | POA: Insufficient documentation

## 2018-08-02 DIAGNOSIS — R7303 Prediabetes: Secondary | ICD-10-CM | POA: Insufficient documentation

## 2018-08-02 DIAGNOSIS — Z87891 Personal history of nicotine dependence: Secondary | ICD-10-CM | POA: Diagnosis not present

## 2018-08-02 LAB — CBC WITH DIFFERENTIAL/PLATELET
Abs Immature Granulocytes: 0.05 10*3/uL (ref 0.00–0.07)
Basophils Absolute: 0.1 10*3/uL (ref 0.0–0.1)
Basophils Relative: 1 %
Eosinophils Absolute: 0.2 10*3/uL (ref 0.0–0.5)
Eosinophils Relative: 3 %
HCT: 49.3 % (ref 39.0–52.0)
Hemoglobin: 17.3 g/dL — ABNORMAL HIGH (ref 13.0–17.0)
Immature Granulocytes: 1 %
Lymphocytes Relative: 28 %
Lymphs Abs: 2.2 10*3/uL (ref 0.7–4.0)
MCH: 30.9 pg (ref 26.0–34.0)
MCHC: 35.1 g/dL (ref 30.0–36.0)
MCV: 88 fL (ref 80.0–100.0)
Monocytes Absolute: 0.6 10*3/uL (ref 0.1–1.0)
Monocytes Relative: 7 %
NRBC: 0 % (ref 0.0–0.2)
Neutro Abs: 4.8 10*3/uL (ref 1.7–7.7)
Neutrophils Relative %: 60 %
Platelets: 263 10*3/uL (ref 150–400)
RBC: 5.6 MIL/uL (ref 4.22–5.81)
RDW: 12.3 % (ref 11.5–15.5)
WBC: 7.9 10*3/uL (ref 4.0–10.5)

## 2018-08-02 LAB — BASIC METABOLIC PANEL
Anion gap: 7 (ref 5–15)
BUN: 10 mg/dL (ref 8–23)
CALCIUM: 9.5 mg/dL (ref 8.9–10.3)
CO2: 25 mmol/L (ref 22–32)
Chloride: 108 mmol/L (ref 98–111)
Creatinine, Ser: 0.98 mg/dL (ref 0.61–1.24)
GFR calc Af Amer: >60 mL/min (ref >60)
GFR calc non Af Amer: >60 mL/min (ref >60)
Glucose, Bld: 122 mg/dL — ABNORMAL HIGH (ref 70–99)
Potassium: 3.8 mmol/L (ref 3.5–5.1)
Sodium: 140 mmol/L (ref 135–145)

## 2018-08-02 NOTE — Patient Instructions (Signed)
Your cardioversion is scheduled for : Monday 08/09/2018 Arrive at the [facility] and go to admitting at 8:30 Do Not eat or drink anything after midnight the night prior to your procedure. Take all your medications with a sip of water prior to arrival. Do NOT miss any doses of your blood thinner. You will NOT be able to drive home after your procedure.

## 2018-08-02 NOTE — H&P (View-Only) (Signed)
Primary Care Physician: [name], MD Referring Physician: [name] is a 73 y.o. male with a h/o  afib s/p ablation x 2, 2014, 2016 and h/o prior multiple cardioversion's. He is being seen in the afib clinic for breakthrough afib,late last week, no know trigger. It has been about 13 months since last cardioversion.  He required cardioversion x 2  the previous year. . No bleeding issues with xarelto, being compliant . He saw Dr. [name] months ago and it was decided that if afib burden increased, he would go ahead with another ablation, however, he would like to get back in SR asap and then purse appointment with Dr. [name] to discuss ablation. He went to the ER yesterday and they would not cardiovert as he was rate controlled and not symptomatic. He does feel tired in afib.  F/u with afib clinic, 08/02/2018, per pt urgent request as he went into afib yesterday. This is the third time it has been triggered by eating ice cream shortly before. He saw Dr. Allredrecently and it was discussed if he had ongoing issues with afib he would go for another ablation. This is pt's feeling today, he would like a cardioversion and another ablation ASAP.  Today, he denies symptoms of palpitations, chest pain, shortness of breath, orthopnea, PND, lower extremity edema, dizziness, presyncope, syncope, or neurologic sequela. The patient is tolerating medications without difficulties and is otherwise without complaint today.   Past Medical History:  Diagnosis Date  . Bladder cancer ([place])   . Hearing loss of both ears   . History of colon polyps    BENIGN  . Hyperlipidemia   . Hypertension   . Mitral regurgitation   . OSA on CPAP    cpap settign of 13  . PAD (peripheral artery disease) ([facility])    ABI--  RIGHT SIDE NORMAL LEFT SIDE MODERATELY REDUCED W/ DISTAL LEFT SFA STENOSIS//  MILD CALDICATION  . PAF (paroxysmal atrial fibrillation) ([place])     a. s/p PVI 2014; b. s/p redo PVI and CTI  01/2015  . Pre-diabetes   . Vitamin D deficiency    Past Surgical History:  Procedure Laterality Date  . ABLATION OF DYSRHYTHMIC FOCUS  01/26/2015  . ATRIAL FIBRILLATION ABLATION N/A 07/27/2012   Procedure: ATRIAL FIBRILLATION ABLATION;  Surgeon: [name], MD;  Location: [facility] CATH LAB;  Service: Cardiovascular;  Laterality: N/A;  . CARDIAC ELECTROPHYSIOLOGY STUDY AND ABLATION  07-27-2012  DR ALLRED   SUCCESSFUL ABLATION OF A-FIB  . CARDIOVERSION  08/06/2012   Procedure: CARDIOVERSION;  Surgeon: [name], MD;  Location: [facility] ENDOSCOPY;  Service: Cardiovascular;  Laterality: N/A;  . CARDIOVERSION N/A 08/25/2014   Procedure: CARDIOVERSION;  Surgeon: Larey Dresser, MD;  Location: [place];  Service: Cardiovascular;  Laterality: N/A;  . CARDIOVERSION N/A 11/13/2014   Procedure: CARDIOVERSION;  Surgeon: Sueanne Margarita, MD;  Location: [facility] ENDOSCOPY;  Service: Cardiovascular;  Laterality: N/A;  . CARDIOVERSION N/A 12/28/2014   Procedure: CARDIOVERSION;  Surgeon: [name], MD;  Location: [facility] ENDOSCOPY;  Service: Cardiovascular;  Laterality: N/A;  . CARDIOVERSION N/A 01/30/2017   Procedure: CARDIOVERSION;  Surgeon: Josue Hector, MD;  Location: [facility] ENDOSCOPY;  Service: Cardiovascular;  Laterality: N/A;  . CARDIOVERSION N/A 04/06/2017   Procedure: CARDIOVERSION;  Surgeon: Dorothy Spark, MD;  Location: [facility] ENDOSCOPY;  Service: Cardiovascular;  Laterality: N/A;  . CARDIOVERSION N/A 05/25/2018   Procedure: CARDIOVERSION;  Surgeon: Skeet Latch, MD;  Location: [place];  Service: Cardiovascular;  Laterality: N/A;  . CATARACT EXTRACTION W/ INTRAOCULAR LENS  IMPLANT, BILATERAL    . COLONOSCOPY WITH PROPOFOL N/A 02/13/2015   Procedure: COLONOSCOPY WITH PROPOFOL;  Surgeon: Garlan Fair, MD;  Location: [facility] ENDOSCOPY;  Service: Endoscopy;  Laterality: N/A;  . CYSTOSCOPY W/ RETROGRADES Bilateral 04/12/2013   Procedure: CYSTOSCOPY WITH RETROGRADE PYELOGRAM;  Surgeon: [name], MD;   Location: [facility];  Service: Urology;  Laterality: Bilateral;  . ELECTROPHYSIOLOGIC STUDY N/A 01/26/2015   Procedure: Atrial Fibrillation Ablation;  Surgeon: [name], MD;  Location: [place] CV LAB;  Service: Cardiovascular;  Laterality: N/A;  . LEFT HEART CATHETERIZATION WITH CORONARY ANGIOGRAM N/A 11/24/2013   Procedure: LEFT HEART CATHETERIZATION WITH CORONARY ANGIOGRAM;  Surgeon: [name], MD;  Location: [facility] CATH LAB;  Service: Cardiovascular;  Laterality: N/A;  . PERCUTANEOUS CORONARY ROTOBLATOR INTERVENTION (PCI-R) N/A 11/28/2013   Procedure: PERCUTANEOUS CORONARY ROTOBLATOR INTERVENTION (PCI-R);  Surgeon: [name], MD;  Location: [facility] CATH LAB;  Service: Cardiovascular;  Laterality: N/A;  . PERCUTANEOUS CORONARY STENT INTERVENTION (PCI-S)  11/24/2013   Procedure: PERCUTANEOUS CORONARY STENT INTERVENTION (PCI-S);  Surgeon: [name], MD;  Location: [facility] CATH LAB;  Service: Cardiovascular;;  . TEE WITHOUT CARDIOVERSION  07/26/2012   Procedure: TRANSESOPHAGEAL ECHOCARDIOGRAM (TEE);  Surgeon: [name], MD;  Location: [facility] ENDOSCOPY;  Service: Cardiovascular;  Laterality: N/A;  . TEE WITHOUT CARDIOVERSION N/A 08/25/2014   Procedure: TRANSESOPHAGEAL ECHOCARDIOGRAM (TEE);  Surgeon: Larey Dresser, MD;  Location: [place];  Service: Cardiovascular;  Laterality: N/A;  . TEE WITHOUT CARDIOVERSION  01/26/2015   Procedure: Transesophageal Echocardiogram (Tee);  Surgeon: [name], MD;  Location: [place] CV LAB;  Service: Cardiovascular;;  . TONSILLECTOMY  age 62  . TOTAL HIP ARTHROPLASTY  07/25/2011   Procedure: TOTAL HIP ARTHROPLASTY ANTERIOR APPROACH;  Surgeon: Mcarthur Rossetti;  Location: [facility] ORS;  Service: Orthopedics;  Laterality: Right;  . TOTAL HIP ARTHROPLASTY Left 02-08-2010  . TRANSURETHRAL RESECTION OF BLADDER TUMOR WITH GYRUS (TURBT-GYRUS) N/A 04/12/2013   Procedure: TRANSURETHRAL RESECTION OF BLADDER TUMOR WITH GYRUS  (TURBT-GYRUS);  Surgeon: [name], MD;  Location: [facility];  Service: Urology;  Laterality: N/A;    Current Outpatient Medications  Medication Sig Dispense Refill  . ALPRAZolam (XANAX) 1 MG tablet Take 1/2 to 1 tab at hour of sleep 30 tablet 0  . bisoprolol-hydrochlorothiazide (ZIAC) 10-6.25 MG tablet TAKE 1 TABLET BY MOUTH ONCE DAILY 90 tablet 1  . CARTIA XT 180 MG 24 hr capsule TAKE 1 CAPSULE BY MOUTH ONCE DAILY 90 capsule 1  . Cholecalciferol (VITAMIN D3) 5000 units CAPS Take 10,000 Units by mouth daily.    . fluticasone (FLONASE) 50 MCG/ACT nasal spray Place 1-2 sprays into both nostrils daily.    . Multiple Vitamin (MULITIVITAMIN WITH MINERALS) TABS Take 1 tablet by mouth daily. FOR MEN    . Polyvinyl Alcohol-Povidone (REFRESH OP) Place 1 drop into both eyes daily as needed (dry eyes).    . rivaroxaban (XARELTO) 20 MG TABS tablet TAKE ONE TABLET BY MOUTH ONCE DAILY WITH SUPPER 90 tablet 0  . rosuvastatin (CRESTOR) 20 MG tablet Take 20 mg by mouth every evening.    Marland Kitchen NITROSTAT 0.4 MG SL tablet DISSOLVE ONE TABLET UNDER THE TONGUE EVERY 5 MINUTES AS NEEDED FOR CHEST PAIN.  DO NOT EXCEED A TOTAL OF 3 DOSES IN 15 MINUTES (Patient not taking: Reported on 08/02/2018) 25 tablet 6   No current facility-administered medications for this  encounter.     Allergies  Allergen Reactions  . Zetia [Ezetimibe] Other (See Comments)    myalgias     Social History   Socioeconomic History  . Marital status: Married    Spouse name: Not on file  . Number of children: Not on file  . Years of education: Not on file  . Highest education level: Not on file  Occupational History  . Not on file  Social Needs  . Financial resource strain: Not on file  . Food insecurity:    Worry: Not on file    Inability: Not on file  . Transportation needs:    Medical: Not on file    Non-medical: Not on file  Tobacco Use  . Smoking status: Former Smoker    Packs/day: 2.00    Years: 10.00      Pack years: 20.00    Types: Cigarettes    Last attempt to quit: 09/20/1981    Years since quitting: 36.8  . Smokeless tobacco: Never Used  Substance and Sexual Activity  . Alcohol use: No    Comment: pt states he has stopped drinking alcohol  . Drug use: No  . Sexual activity: Not on file  Lifestyle  . Physical activity:    Days per week: Not on file    Minutes per session: Not on file  . Stress: Not on file  Relationships  . Social connections:    Talks on phone: Not on file    Gets together: Not on file    Attends religious service: Not on file    Active member of club or organization: Not on file    Attends meetings of clubs or organizations: Not on file    Relationship status: Not on file  . Intimate partner violence:    Fear of current or ex partner: Not on file    Emotionally abused: Not on file    Physically abused: Not on file    Forced sexual activity: Not on file  Other Topics Concern  . Not on file  Social History Narrative  . Not on file    Family History  Problem Relation Age of Onset  . Heart attack Mother   . Heart disease Mother   . Heart failure Father   . Heart disease Father     ROS- All systems are reviewed and negative except as per the HPI above  Physical Exam: Vitals:   08/02/18 0908  BP: 112/64  Pulse: 100  Weight: 93.8 kg  Height: 5\' 9"  (1.753 m)   Wt Readings from Last 3 Encounters:  08/02/18 93.8 kg  06/29/18 94.3 kg  06/28/18 95.2 kg    Labs: Lab Results  Component Value Date   NA 140 08/02/2018   K 3.8 08/02/2018   CL 108 08/02/2018   CO2 25 08/02/2018   GLUCOSE 122 (H) 08/02/2018   BUN 10 08/02/2018   CREATININE 0.98 08/02/2018   CALCIUM 9.5 08/02/2018   MG 2.0 06/29/2018   Lab Results  Component Value Date   INR 1.1 (H) 11/16/2013   Lab Results  Component Value Date   CHOL 163 06/29/2018   HDL 60 06/29/2018   LDLCALC 86 06/29/2018   TRIG 77 06/29/2018     GEN- The patient is well appearing, alert and  oriented x 3 today.   Head- normocephalic, atraumatic Eyes-  Sclera clear, conjunctiva pink Ears- hearing intact Oropharynx- clear Neck- supple, no JVP Lymph- no cervical lymphadenopathy Lungs- Clear to ausculation  bilaterally, normal work of breathing Heart-  irregular rate and rhythm, no murmurs, rubs or gallops, PMI not laterally displaced GI- soft, NT, ND, + BS Extremities- no clubbing, cyanosis, or edema MS- no significant deformity or atrophy Skin- no rash or lesion Psych- euthymic mood, full affect Neuro- strength and sensation are intact  EKG- afib at 100  bpm,qrs int 88 ms, qtc 482 ms Epic records reviewed  Assessment and Plan: 1. Paroxysmal afib Rate controlled Back in afib since yesterday Will schedule for cardioversion but will also schedule next available ablation with Dr. [name], Dr. [name] is aware of this plan, gives OK to go ahead and schedule ablation for 2/5, pt  is aware of risk vrs benefit as this will be his 3rd ablation.  Continue xarelto, chadsvasc score of at least 2, states no missed doses x 3 weeks Continue cardizem 180 mg daily /bisoprolol Cbc/bmet today  2. HTN Stable     Jasara Corrigan C. Delma Villalva, [facility] [address] [place], [place] [address] [phone]

## 2018-08-02 NOTE — H&P (View-Only) (Signed)
Primary Care Physician: [name], MD Referring Physician: [name] is a 73 y.o. male with a h/o  afib s/p ablation x 2, 2014, 2016 and h/o prior multiple cardioversion's. He is being seen in the afib clinic for breakthrough afib,late last week, no know trigger. It has been about 13 months since last cardioversion.  He required cardioversion x 2  the previous year. . No bleeding issues with xarelto, being compliant . He saw Dr. [name] months ago and it was decided that if afib burden increased, he would go ahead with another ablation, however, he would like to get back in SR asap and then purse appointment with Dr. [name] to discuss ablation. He went to the ER yesterday and they would not cardiovert as he was rate controlled and not symptomatic. He does feel tired in afib.  F/u with afib clinic, 08/02/2018, per pt urgent request as he went into afib yesterday. This is the third time it has been triggered by eating ice cream shortly before. He saw Dr. Allredrecently and it was discussed if he had ongoing issues with afib he would go for another ablation. This is pt's feeling today, he would like a cardioversion and another ablation ASAP.  Today, he denies symptoms of palpitations, chest pain, shortness of breath, orthopnea, PND, lower extremity edema, dizziness, presyncope, syncope, or neurologic sequela. The patient is tolerating medications without difficulties and is otherwise without complaint today.   Past Medical History:  Diagnosis Date  . Bladder cancer ([place])   . Hearing loss of both ears   . History of colon polyps    BENIGN  . Hyperlipidemia   . Hypertension   . Mitral regurgitation   . OSA on CPAP    cpap settign of 13  . PAD (peripheral artery disease) ([facility])    ABI--  RIGHT SIDE NORMAL LEFT SIDE MODERATELY REDUCED W/ DISTAL LEFT SFA STENOSIS//  MILD CALDICATION  . PAF (paroxysmal atrial fibrillation) ([place])     a. s/p PVI 2014; b. s/p redo PVI and CTI  01/2015  . Pre-diabetes   . Vitamin D deficiency    Past Surgical History:  Procedure Laterality Date  . ABLATION OF DYSRHYTHMIC FOCUS  01/26/2015  . ATRIAL FIBRILLATION ABLATION N/A 07/27/2012   Procedure: ATRIAL FIBRILLATION ABLATION;  Surgeon: [name], MD;  Location: [facility] CATH LAB;  Service: Cardiovascular;  Laterality: N/A;  . CARDIAC ELECTROPHYSIOLOGY STUDY AND ABLATION  07-27-2012  DR ALLRED   SUCCESSFUL ABLATION OF A-FIB  . CARDIOVERSION  08/06/2012   Procedure: CARDIOVERSION;  Surgeon: [name], MD;  Location: [facility] ENDOSCOPY;  Service: Cardiovascular;  Laterality: N/A;  . CARDIOVERSION N/A 08/25/2014   Procedure: CARDIOVERSION;  Surgeon: Larey Dresser, MD;  Location: [place];  Service: Cardiovascular;  Laterality: N/A;  . CARDIOVERSION N/A 11/13/2014   Procedure: CARDIOVERSION;  Surgeon: Sueanne Margarita, MD;  Location: [facility] ENDOSCOPY;  Service: Cardiovascular;  Laterality: N/A;  . CARDIOVERSION N/A 12/28/2014   Procedure: CARDIOVERSION;  Surgeon: [name], MD;  Location: [facility] ENDOSCOPY;  Service: Cardiovascular;  Laterality: N/A;  . CARDIOVERSION N/A 01/30/2017   Procedure: CARDIOVERSION;  Surgeon: Josue Hector, MD;  Location: [facility] ENDOSCOPY;  Service: Cardiovascular;  Laterality: N/A;  . CARDIOVERSION N/A 04/06/2017   Procedure: CARDIOVERSION;  Surgeon: Dorothy Spark, MD;  Location: [facility] ENDOSCOPY;  Service: Cardiovascular;  Laterality: N/A;  . CARDIOVERSION N/A 05/25/2018   Procedure: CARDIOVERSION;  Surgeon: Skeet Latch, MD;  Location: [place];  Service: Cardiovascular;  Laterality: N/A;  . CATARACT EXTRACTION W/ INTRAOCULAR LENS  IMPLANT, BILATERAL    . COLONOSCOPY WITH PROPOFOL N/A 02/13/2015   Procedure: COLONOSCOPY WITH PROPOFOL;  Surgeon: Garlan Fair, MD;  Location: [facility] ENDOSCOPY;  Service: Endoscopy;  Laterality: N/A;  . CYSTOSCOPY W/ RETROGRADES Bilateral 04/12/2013   Procedure: CYSTOSCOPY WITH RETROGRADE PYELOGRAM;  Surgeon: [name], MD;   Location: [facility];  Service: Urology;  Laterality: Bilateral;  . ELECTROPHYSIOLOGIC STUDY N/A 01/26/2015   Procedure: Atrial Fibrillation Ablation;  Surgeon: [name], MD;  Location: [place] CV LAB;  Service: Cardiovascular;  Laterality: N/A;  . LEFT HEART CATHETERIZATION WITH CORONARY ANGIOGRAM N/A 11/24/2013   Procedure: LEFT HEART CATHETERIZATION WITH CORONARY ANGIOGRAM;  Surgeon: [name], MD;  Location: [facility] CATH LAB;  Service: Cardiovascular;  Laterality: N/A;  . PERCUTANEOUS CORONARY ROTOBLATOR INTERVENTION (PCI-R) N/A 11/28/2013   Procedure: PERCUTANEOUS CORONARY ROTOBLATOR INTERVENTION (PCI-R);  Surgeon: [name], MD;  Location: [facility] CATH LAB;  Service: Cardiovascular;  Laterality: N/A;  . PERCUTANEOUS CORONARY STENT INTERVENTION (PCI-S)  11/24/2013   Procedure: PERCUTANEOUS CORONARY STENT INTERVENTION (PCI-S);  Surgeon: [name], MD;  Location: [facility] CATH LAB;  Service: Cardiovascular;;  . TEE WITHOUT CARDIOVERSION  07/26/2012   Procedure: TRANSESOPHAGEAL ECHOCARDIOGRAM (TEE);  Surgeon: [name], MD;  Location: [facility] ENDOSCOPY;  Service: Cardiovascular;  Laterality: N/A;  . TEE WITHOUT CARDIOVERSION N/A 08/25/2014   Procedure: TRANSESOPHAGEAL ECHOCARDIOGRAM (TEE);  Surgeon: Larey Dresser, MD;  Location: [place];  Service: Cardiovascular;  Laterality: N/A;  . TEE WITHOUT CARDIOVERSION  01/26/2015   Procedure: Transesophageal Echocardiogram (Tee);  Surgeon: [name], MD;  Location: [place] CV LAB;  Service: Cardiovascular;;  . TONSILLECTOMY  age 88  . TOTAL HIP ARTHROPLASTY  07/25/2011   Procedure: TOTAL HIP ARTHROPLASTY ANTERIOR APPROACH;  Surgeon: Mcarthur Rossetti;  Location: [facility];  Service: Orthopedics;  Laterality: Right;  . TOTAL HIP ARTHROPLASTY Left 02-08-2010  . TRANSURETHRAL RESECTION OF BLADDER TUMOR WITH GYRUS (TURBT-GYRUS) N/A 04/12/2013   Procedure: TRANSURETHRAL RESECTION OF BLADDER TUMOR WITH GYRUS  (TURBT-GYRUS);  Surgeon: [name], MD;  Location: [facility];  Service: Urology;  Laterality: N/A;    Current Outpatient Medications  Medication Sig Dispense Refill  . ALPRAZolam (XANAX) 1 MG tablet Take 1/2 to 1 tab at hour of sleep 30 tablet 0  . bisoprolol-hydrochlorothiazide (ZIAC) 10-6.25 MG tablet TAKE 1 TABLET BY MOUTH ONCE DAILY 90 tablet 1  . CARTIA XT 180 MG 24 hr capsule TAKE 1 CAPSULE BY MOUTH ONCE DAILY 90 capsule 1  . Cholecalciferol (VITAMIN D3) 5000 units CAPS Take 10,000 Units by mouth daily.    . fluticasone (FLONASE) 50 MCG/ACT nasal spray Place 1-2 sprays into both nostrils daily.    . Multiple Vitamin (MULITIVITAMIN WITH MINERALS) TABS Take 1 tablet by mouth daily. FOR MEN    . Polyvinyl Alcohol-Povidone (REFRESH OP) Place 1 drop into both eyes daily as needed (dry eyes).    . rivaroxaban (XARELTO) 20 MG TABS tablet TAKE ONE TABLET BY MOUTH ONCE DAILY WITH SUPPER 90 tablet 0  . rosuvastatin (CRESTOR) 20 MG tablet Take 20 mg by mouth every evening.    Marland Kitchen NITROSTAT 0.4 MG SL tablet DISSOLVE ONE TABLET UNDER THE TONGUE EVERY 5 MINUTES AS NEEDED FOR CHEST PAIN.  DO NOT EXCEED A TOTAL OF 3 DOSES IN 15 MINUTES (Patient not taking: Reported on 08/02/2018) 25 tablet 6   No current facility-administered medications for this  encounter.     Allergies  Allergen Reactions  . Zetia [Ezetimibe] Other (See Comments)    myalgias     Social History   Socioeconomic History  . Marital status: Married    Spouse name: Not on file  . Number of children: Not on file  . Years of education: Not on file  . Highest education level: Not on file  Occupational History  . Not on file  Social Needs  . Financial resource strain: Not on file  . Food insecurity:    Worry: Not on file    Inability: Not on file  . Transportation needs:    Medical: Not on file    Non-medical: Not on file  Tobacco Use  . Smoking status: Former Smoker    Packs/day: 2.00    Years: 10.00      Pack years: 20.00    Types: Cigarettes    Last attempt to quit: 09/20/1981    Years since quitting: 36.8  . Smokeless tobacco: Never Used  Substance and Sexual Activity  . Alcohol use: No    Comment: pt states he has stopped drinking alcohol  . Drug use: No  . Sexual activity: Not on file  Lifestyle  . Physical activity:    Days per week: Not on file    Minutes per session: Not on file  . Stress: Not on file  Relationships  . Social connections:    Talks on phone: Not on file    Gets together: Not on file    Attends religious service: Not on file    Active member of club or organization: Not on file    Attends meetings of clubs or organizations: Not on file    Relationship status: Not on file  . Intimate partner violence:    Fear of current or ex partner: Not on file    Emotionally abused: Not on file    Physically abused: Not on file    Forced sexual activity: Not on file  Other Topics Concern  . Not on file  Social History Narrative  . Not on file    Family History  Problem Relation Age of Onset  . Heart attack Mother   . Heart disease Mother   . Heart failure Father   . Heart disease Father     ROS- All systems are reviewed and negative except as per the HPI above  Physical Exam: Vitals:   08/02/18 0908  BP: 112/64  Pulse: 100  Weight: 93.8 kg  Height: 5\' 9"  (1.753 m)   Wt Readings from Last 3 Encounters:  08/02/18 93.8 kg  06/29/18 94.3 kg  06/28/18 95.2 kg    Labs: Lab Results  Component Value Date   NA 140 08/02/2018   K 3.8 08/02/2018   CL 108 08/02/2018   CO2 25 08/02/2018   GLUCOSE 122 (H) 08/02/2018   BUN 10 08/02/2018   CREATININE 0.98 08/02/2018   CALCIUM 9.5 08/02/2018   MG 2.0 06/29/2018   Lab Results  Component Value Date   INR 1.1 (H) 11/16/2013   Lab Results  Component Value Date   CHOL 163 06/29/2018   HDL 60 06/29/2018   LDLCALC 86 06/29/2018   TRIG 77 06/29/2018     GEN- The patient is well appearing, alert and  oriented x 3 today.   Head- normocephalic, atraumatic Eyes-  Sclera clear, conjunctiva pink Ears- hearing intact Oropharynx- clear Neck- supple, no JVP Lymph- no cervical lymphadenopathy Lungs- Clear to ausculation  bilaterally, normal work of breathing Heart-  irregular rate and rhythm, no murmurs, rubs or gallops, PMI not laterally displaced GI- soft, NT, ND, + BS Extremities- no clubbing, cyanosis, or edema MS- no significant deformity or atrophy Skin- no rash or lesion Psych- euthymic mood, full affect Neuro- strength and sensation are intact  EKG- afib at 100  bpm,qrs int 88 ms, qtc 482 ms Epic records reviewed  Assessment and Plan: 1. Paroxysmal afib Rate controlled Back in afib since yesterday Will schedule for cardioversion but will also schedule next available ablation with Dr. [name], Dr. [name] is aware of this plan, gives OK to go ahead and schedule ablation for 2/5, pt  is aware of risk vrs benefit as this will be his 3rd ablation.  Continue xarelto, chadsvasc score of at least 2, states no missed doses x 3 weeks Continue cardizem 180 mg daily /bisoprolol Cbc/bmet today  2. HTN Stable     Donna C. Carroll, [facility] [address] [place], [place] [address] [phone]

## 2018-08-02 NOTE — Progress Notes (Signed)
Primary Care Physician: [name], MD Referring Physician: [name] is a 73 y.o. male with a h/o  afib s/p ablation x 2, 2014, 2016 and h/o prior multiple cardioversion's. He is being seen in the afib clinic for breakthrough afib,late last week, no know trigger. It has been about 13 months since last cardioversion.  He required cardioversion x 2  the previous year. . No bleeding issues with xarelto, being compliant . He saw Dr. [name] months ago and it was decided that if afib burden increased, he would go ahead with another ablation, however, he would like to get back in SR asap and then purse appointment with Dr. [name] to discuss ablation. He went to the ER yesterday and they would not cardiovert as he was rate controlled and not symptomatic. He does feel tired in afib.  F/u with afib clinic, 08/02/2018, per pt urgent request as he went into afib yesterday. This is the third time it has been triggered by eating ice cream shortly before. He saw Dr. Allredrecently and it was discussed if he had ongoing issues with afib he would go for another ablation. This is pt's feeling today, he would like a cardioversion and another ablation ASAP.  Today, he denies symptoms of palpitations, chest pain, shortness of breath, orthopnea, PND, lower extremity edema, dizziness, presyncope, syncope, or neurologic sequela. The patient is tolerating medications without difficulties and is otherwise without complaint today.   Past Medical History:  Diagnosis Date  . Bladder cancer ([place])   . Hearing loss of both ears   . History of colon polyps    BENIGN  . Hyperlipidemia   . Hypertension   . Mitral regurgitation   . OSA on CPAP    cpap settign of 13  . PAD (peripheral artery disease) ([facility])    ABI--  RIGHT SIDE NORMAL LEFT SIDE MODERATELY REDUCED W/ DISTAL LEFT SFA STENOSIS//  MILD CALDICATION  . PAF (paroxysmal atrial fibrillation) ([place])     a. s/p PVI 2014; b. s/p redo PVI and CTI  01/2015  . Pre-diabetes   . Vitamin D deficiency    Past Surgical History:  Procedure Laterality Date  . ABLATION OF DYSRHYTHMIC FOCUS  01/26/2015  . ATRIAL FIBRILLATION ABLATION N/A 07/27/2012   Procedure: ATRIAL FIBRILLATION ABLATION;  Surgeon: [name], MD;  Location: [facility] CATH LAB;  Service: Cardiovascular;  Laterality: N/A;  . CARDIAC ELECTROPHYSIOLOGY STUDY AND ABLATION  07-27-2012  DR ALLRED   SUCCESSFUL ABLATION OF A-FIB  . CARDIOVERSION  08/06/2012   Procedure: CARDIOVERSION;  Surgeon: [name], MD;  Location: [facility] ENDOSCOPY;  Service: Cardiovascular;  Laterality: N/A;  . CARDIOVERSION N/A 08/25/2014   Procedure: CARDIOVERSION;  Surgeon: Larey Dresser, MD;  Location: [place];  Service: Cardiovascular;  Laterality: N/A;  . CARDIOVERSION N/A 11/13/2014   Procedure: CARDIOVERSION;  Surgeon: Sueanne Margarita, MD;  Location: [facility] ENDOSCOPY;  Service: Cardiovascular;  Laterality: N/A;  . CARDIOVERSION N/A 12/28/2014   Procedure: CARDIOVERSION;  Surgeon: [name], MD;  Location: [facility] ENDOSCOPY;  Service: Cardiovascular;  Laterality: N/A;  . CARDIOVERSION N/A 01/30/2017   Procedure: CARDIOVERSION;  Surgeon: Josue Hector, MD;  Location: [facility] ENDOSCOPY;  Service: Cardiovascular;  Laterality: N/A;  . CARDIOVERSION N/A 04/06/2017   Procedure: CARDIOVERSION;  Surgeon: Dorothy Spark, MD;  Location: [facility] ENDOSCOPY;  Service: Cardiovascular;  Laterality: N/A;  . CARDIOVERSION N/A 05/25/2018   Procedure: CARDIOVERSION;  Surgeon: Skeet Latch, MD;  Location: [place];  Service: Cardiovascular;  Laterality: N/A;  . CATARACT EXTRACTION W/ INTRAOCULAR LENS  IMPLANT, BILATERAL    . COLONOSCOPY WITH PROPOFOL N/A 02/13/2015   Procedure: COLONOSCOPY WITH PROPOFOL;  Surgeon: Garlan Fair, MD;  Location: [facility] ENDOSCOPY;  Service: Endoscopy;  Laterality: N/A;  . CYSTOSCOPY W/ RETROGRADES Bilateral 04/12/2013   Procedure: CYSTOSCOPY WITH RETROGRADE PYELOGRAM;  Surgeon: [name], MD;   Location: [facility];  Service: Urology;  Laterality: Bilateral;  . ELECTROPHYSIOLOGIC STUDY N/A 01/26/2015   Procedure: Atrial Fibrillation Ablation;  Surgeon: [name], MD;  Location: [place] CV LAB;  Service: Cardiovascular;  Laterality: N/A;  . LEFT HEART CATHETERIZATION WITH CORONARY ANGIOGRAM N/A 11/24/2013   Procedure: LEFT HEART CATHETERIZATION WITH CORONARY ANGIOGRAM;  Surgeon: [name], MD;  Location: [facility] CATH LAB;  Service: Cardiovascular;  Laterality: N/A;  . PERCUTANEOUS CORONARY ROTOBLATOR INTERVENTION (PCI-R) N/A 11/28/2013   Procedure: PERCUTANEOUS CORONARY ROTOBLATOR INTERVENTION (PCI-R);  Surgeon: [name], MD;  Location: [facility] CATH LAB;  Service: Cardiovascular;  Laterality: N/A;  . PERCUTANEOUS CORONARY STENT INTERVENTION (PCI-S)  11/24/2013   Procedure: PERCUTANEOUS CORONARY STENT INTERVENTION (PCI-S);  Surgeon: [name], MD;  Location: [facility] CATH LAB;  Service: Cardiovascular;;  . TEE WITHOUT CARDIOVERSION  07/26/2012   Procedure: TRANSESOPHAGEAL ECHOCARDIOGRAM (TEE);  Surgeon: [name], MD;  Location: [facility] ENDOSCOPY;  Service: Cardiovascular;  Laterality: N/A;  . TEE WITHOUT CARDIOVERSION N/A 08/25/2014   Procedure: TRANSESOPHAGEAL ECHOCARDIOGRAM (TEE);  Surgeon: Larey Dresser, MD;  Location: [place];  Service: Cardiovascular;  Laterality: N/A;  . TEE WITHOUT CARDIOVERSION  01/26/2015   Procedure: Transesophageal Echocardiogram (Tee);  Surgeon: [name], MD;  Location: [place] CV LAB;  Service: Cardiovascular;;  . TONSILLECTOMY  age 37  . TOTAL HIP ARTHROPLASTY  07/25/2011   Procedure: TOTAL HIP ARTHROPLASTY ANTERIOR APPROACH;  Surgeon: Mcarthur Rossetti;  Location: [facility] ORS;  Service: Orthopedics;  Laterality: Right;  . TOTAL HIP ARTHROPLASTY Left 02-08-2010  . TRANSURETHRAL RESECTION OF BLADDER TUMOR WITH GYRUS (TURBT-GYRUS) N/A 04/12/2013   Procedure: TRANSURETHRAL RESECTION OF BLADDER TUMOR WITH GYRUS  (TURBT-GYRUS);  Surgeon: [name], MD;  Location: [facility];  Service: Urology;  Laterality: N/A;    Current Outpatient Medications  Medication Sig Dispense Refill  . ALPRAZolam (XANAX) 1 MG tablet Take 1/2 to 1 tab at hour of sleep 30 tablet 0  . bisoprolol-hydrochlorothiazide (ZIAC) 10-6.25 MG tablet TAKE 1 TABLET BY MOUTH ONCE DAILY 90 tablet 1  . CARTIA XT 180 MG 24 hr capsule TAKE 1 CAPSULE BY MOUTH ONCE DAILY 90 capsule 1  . Cholecalciferol (VITAMIN D3) 5000 units CAPS Take 10,000 Units by mouth daily.    . fluticasone (FLONASE) 50 MCG/ACT nasal spray Place 1-2 sprays into both nostrils daily.    . Multiple Vitamin (MULITIVITAMIN WITH MINERALS) TABS Take 1 tablet by mouth daily. FOR MEN    . Polyvinyl Alcohol-Povidone (REFRESH OP) Place 1 drop into both eyes daily as needed (dry eyes).    . rivaroxaban (XARELTO) 20 MG TABS tablet TAKE ONE TABLET BY MOUTH ONCE DAILY WITH SUPPER 90 tablet 0  . rosuvastatin (CRESTOR) 20 MG tablet Take 20 mg by mouth every evening.    Marland Kitchen NITROSTAT 0.4 MG SL tablet DISSOLVE ONE TABLET UNDER THE TONGUE EVERY 5 MINUTES AS NEEDED FOR CHEST PAIN.  DO NOT EXCEED A TOTAL OF 3 DOSES IN 15 MINUTES (Patient not taking: Reported on 08/02/2018) 25 tablet 6   No current facility-administered medications for this  encounter.     Allergies  Allergen Reactions  . Zetia [Ezetimibe] Other (See Comments)    myalgias     Social History   Socioeconomic History  . Marital status: Married    Spouse name: Not on file  . Number of children: Not on file  . Years of education: Not on file  . Highest education level: Not on file  Occupational History  . Not on file  Social Needs  . Financial resource strain: Not on file  . Food insecurity:    Worry: Not on file    Inability: Not on file  . Transportation needs:    Medical: Not on file    Non-medical: Not on file  Tobacco Use  . Smoking status: Former Smoker    Packs/day: 2.00    Years: 10.00      Pack years: 20.00    Types: Cigarettes    Last attempt to quit: 09/20/1981    Years since quitting: 36.8  . Smokeless tobacco: Never Used  Substance and Sexual Activity  . Alcohol use: No    Comment: pt states he has stopped drinking alcohol  . Drug use: No  . Sexual activity: Not on file  Lifestyle  . Physical activity:    Days per week: Not on file    Minutes per session: Not on file  . Stress: Not on file  Relationships  . Social connections:    Talks on phone: Not on file    Gets together: Not on file    Attends religious service: Not on file    Active member of club or organization: Not on file    Attends meetings of clubs or organizations: Not on file    Relationship status: Not on file  . Intimate partner violence:    Fear of current or ex partner: Not on file    Emotionally abused: Not on file    Physically abused: Not on file    Forced sexual activity: Not on file  Other Topics Concern  . Not on file  Social History Narrative  . Not on file    Family History  Problem Relation Age of Onset  . Heart attack Mother   . Heart disease Mother   . Heart failure Father   . Heart disease Father     ROS- All systems are reviewed and negative except as per the HPI above  Physical Exam: Vitals:   08/02/18 0908  BP: 112/64  Pulse: 100  Weight: 93.8 kg  Height: 5\' 9"  (1.753 m)   Wt Readings from Last 3 Encounters:  08/02/18 93.8 kg  06/29/18 94.3 kg  06/28/18 95.2 kg    Labs: Lab Results  Component Value Date   NA 140 08/02/2018   K 3.8 08/02/2018   CL 108 08/02/2018   CO2 25 08/02/2018   GLUCOSE 122 (H) 08/02/2018   BUN 10 08/02/2018   CREATININE 0.98 08/02/2018   CALCIUM 9.5 08/02/2018   MG 2.0 06/29/2018   Lab Results  Component Value Date   INR 1.1 (H) 11/16/2013   Lab Results  Component Value Date   CHOL 163 06/29/2018   HDL 60 06/29/2018   LDLCALC 86 06/29/2018   TRIG 77 06/29/2018     GEN- The patient is well appearing, alert and  oriented x 3 today.   Head- normocephalic, atraumatic Eyes-  Sclera clear, conjunctiva pink Ears- hearing intact Oropharynx- clear Neck- supple, no JVP Lymph- no cervical lymphadenopathy Lungs- Clear to ausculation  bilaterally, normal work of breathing Heart-  irregular rate and rhythm, no murmurs, rubs or gallops, PMI not laterally displaced GI- soft, NT, ND, + BS Extremities- no clubbing, cyanosis, or edema MS- no significant deformity or atrophy Skin- no rash or lesion Psych- euthymic mood, full affect Neuro- strength and sensation are intact  EKG- afib at 100  bpm,qrs int 88 ms, qtc 482 ms Epic records reviewed  Assessment and Plan: 1. Paroxysmal afib Rate controlled Back in afib since yesterday Will schedule for cardioversion but will also schedule next available ablation with Dr. [name], Dr. [name] is aware of this plan, gives OK to go ahead and schedule ablation for 2/5, pt  is aware of risk vrs benefit as this will be his 3rd ablation.  Continue xarelto, chadsvasc score of at least 2, states no missed doses x 3 weeks Continue cardizem 180 mg daily /bisoprolol Cbc/bmet today  2. HTN Stable     Teyona Nichelson C. Teira Arcilla, [facility] [address] [place],  [address] [phone]

## 2018-08-06 ENCOUNTER — Encounter (HOSPITAL_COMMUNITY): Payer: Self-pay

## 2018-08-09 ENCOUNTER — Ambulatory Visit (HOSPITAL_COMMUNITY)
Admission: RE | Admit: 2018-08-09 | Discharge: 2018-08-09 | Disposition: A | Discharge status: Home / Self Care | Payer: Medicare Other | Attending: Cardiology | Admitting: Cardiology

## 2018-08-09 ENCOUNTER — Ambulatory Visit (HOSPITAL_COMMUNITY): Payer: Medicare Other | Admitting: Registered Nurse

## 2018-08-09 ENCOUNTER — Other Ambulatory Visit: Payer: Self-pay

## 2018-08-09 ENCOUNTER — Encounter (HOSPITAL_COMMUNITY): Payer: Self-pay | Admitting: *Deleted

## 2018-08-09 ENCOUNTER — Encounter (HOSPITAL_COMMUNITY)
Admission: RE | Disposition: A | Discharge status: Home / Self Care | Payer: Self-pay | Source: Home / Self Care | Attending: Cardiology

## 2018-08-09 DIAGNOSIS — Z7901 Long term (current) use of anticoagulants: Secondary | ICD-10-CM | POA: Insufficient documentation

## 2018-08-09 DIAGNOSIS — I739 Peripheral vascular disease, unspecified: Secondary | ICD-10-CM | POA: Diagnosis not present

## 2018-08-09 DIAGNOSIS — E785 Hyperlipidemia, unspecified: Secondary | ICD-10-CM | POA: Diagnosis not present

## 2018-08-09 DIAGNOSIS — G4733 Obstructive sleep apnea (adult) (pediatric): Secondary | ICD-10-CM | POA: Insufficient documentation

## 2018-08-09 DIAGNOSIS — Z955 Presence of coronary angioplasty implant and graft: Secondary | ICD-10-CM | POA: Insufficient documentation

## 2018-08-09 DIAGNOSIS — I482 Chronic atrial fibrillation, unspecified: Secondary | ICD-10-CM

## 2018-08-09 DIAGNOSIS — H9193 Unspecified hearing loss, bilateral: Secondary | ICD-10-CM | POA: Insufficient documentation

## 2018-08-09 DIAGNOSIS — Z87891 Personal history of nicotine dependence: Secondary | ICD-10-CM | POA: Diagnosis not present

## 2018-08-09 DIAGNOSIS — Z8249 Family history of ischemic heart disease and other diseases of the circulatory system: Secondary | ICD-10-CM | POA: Diagnosis not present

## 2018-08-09 DIAGNOSIS — I480 Paroxysmal atrial fibrillation: Secondary | ICD-10-CM | POA: Insufficient documentation

## 2018-08-09 DIAGNOSIS — Z888 Allergy status to other drugs, medicaments and biological substances status: Secondary | ICD-10-CM | POA: Diagnosis not present

## 2018-08-09 DIAGNOSIS — R7303 Prediabetes: Secondary | ICD-10-CM | POA: Diagnosis not present

## 2018-08-09 DIAGNOSIS — Z79899 Other long term (current) drug therapy: Secondary | ICD-10-CM | POA: Insufficient documentation

## 2018-08-09 DIAGNOSIS — Z7951 Long term (current) use of inhaled steroids: Secondary | ICD-10-CM | POA: Diagnosis not present

## 2018-08-09 DIAGNOSIS — I10 Essential (primary) hypertension: Secondary | ICD-10-CM | POA: Diagnosis not present

## 2018-08-09 HISTORY — PX: CARDIOVERSION: SHX1299

## 2018-08-09 SURGERY — CARDIOVERSION
Anesthesia: General

## 2018-08-09 MED ORDER — LIDOCAINE 2% (20 MG/ML) 5 ML SYRINGE
INTRAMUSCULAR | Status: DC | PRN
  Administered 2018-08-09: 90 mg via INTRAVENOUS

## 2018-08-09 MED ORDER — PROPOFOL 10 MG/ML IV BOLUS
INTRAVENOUS | Status: DC | PRN
  Administered 2018-08-09: 65 mg via INTRAVENOUS

## 2018-08-09 MED ORDER — SODIUM CHLORIDE 0.9 % IV SOLN
INTRAVENOUS | Status: DC
  Administered 2018-08-09: 08:00:00 via INTRAVENOUS

## 2018-08-09 NOTE — Anesthesia Preprocedure Evaluation (Addendum)
Anesthesia Evaluation  Patient identified by MRN, date of birth, ID band Patient awake    Reviewed: Allergy & Precautions, NPO status , Patient's Chart, lab work & pertinent test results  Airway Mallampati: II  TM Distance: >3 FB Neck ROM: Full    Dental no notable dental hx. (+) Teeth Intact, Dental Advisory Given   Pulmonary sleep apnea , former smoker,    Pulmonary exam normal breath sounds clear to auscultation       Cardiovascular hypertension, + CAD  Normal cardiovascular exam+ dysrhythmias Atrial Fibrillation  Rhythm:Regular Rate:Normal  Echo 07/06/18 Left ventricle: The cavity size was normal. Wall thickness was   normal. Systolic function was normal. The estimated ejection   fraction was in the range of 60% to 65%. Wall motion was normal;   there were no regional wall motion abnormalities. Left   ventricular diastolic function parameters were normal for the   patient&'s age. - Left atrium: The atrium was mildly dilated. - Right atrium: The atrium was mildly dilated.   Neuro/Psych Anxiety negative neurological ROS  negative psych ROS   GI/Hepatic Neg liver ROS, GERD  ,  Endo/Other  negative endocrine ROS  Renal/GU negative Renal ROS     Musculoskeletal negative musculoskeletal ROS (+)   Abdominal (+) + obese,   Peds  Hematology negative hematology ROS (+)   Anesthesia Other Findings   Reproductive/Obstetrics                            Lab Results  Component Value Date   WBC 7.9 08/02/2018   HGB 17.3 (H) 08/02/2018   HCT 49.3 08/02/2018   MCV 88.0 08/02/2018   PLT 263 08/02/2018    Anesthesia Physical Anesthesia Plan  ASA: III  Anesthesia Plan: General   Post-op Pain Management:    Induction: Intravenous  PONV Risk Score and Plan: Treatment may vary due to age or medical condition, Ondansetron and Dexamethasone  Airway Management Planned: Nasal Cannula and Natural  Airway  Additional Equipment:   Intra-op Plan:   Post-operative Plan: Extubation in OR  Informed Consent: I have reviewed the patients History and Physical, chart, labs and discussed the procedure including the risks, benefits and alternatives for the proposed anesthesia with the patient or authorized representative who has indicated his/her understanding and acceptance.     Dental advisory given  Plan Discussed with: CRNA  Anesthesia Plan Comments:        Anesthesia Quick Evaluation

## 2018-08-09 NOTE — Anesthesia Postprocedure Evaluation (Signed)
Anesthesia Post Note  Patient: Damon [name]  Procedure(s) Performed: CARDIOVERSION (N/A )     Patient location during evaluation: Endoscopy Anesthesia Type: General Level of consciousness: awake and alert Pain management: pain level controlled Vital Signs Assessment: post-procedure vital signs reviewed and stable Respiratory status: spontaneous breathing, nonlabored ventilation, respiratory function stable and patient connected to nasal cannula oxygen Cardiovascular status: blood pressure returned to baseline and stable Postop Assessment: no apparent nausea or vomiting Anesthetic complications: no    Last Vitals:  Vitals:   08/09/18 0915 08/09/18 0925  BP: 98/63 108/64  Pulse: (!) 53 (!) 55  Resp: 17 13  Temp:    SpO2: 94% 95%    Last Pain:  Vitals:   08/09/18 0925  TempSrc:   PainSc: 0-No pain                 Barnet Glasgow

## 2018-08-09 NOTE — Anesthesia Procedure Notes (Signed)
Date/Time: 08/09/2018 8:58 AM Performed by: Trinna Post., CRNA Pre-anesthesia Checklist: Patient identified, Emergency Drugs available, Suction available, Patient being monitored and Timeout performed Patient Re-evaluated:Patient Re-evaluated prior to induction Oxygen Delivery Method: Ambu bag Preoxygenation: Pre-oxygenation with 100% oxygen Induction Type: IV induction Placement Confirmation: positive ETCO2

## 2018-08-09 NOTE — Transfer of Care (Signed)
Immediate Anesthesia Transfer of Care Note  Patient: Damon [name]  Procedure(s) Performed: CARDIOVERSION (N/A )  Patient Location: PACU and Endoscopy Unit  Anesthesia Type:General  Level of Consciousness: awake and drowsy  Airway & Oxygen Therapy: Patient Spontanous Breathing  Post-op Assessment: Report given to RN and Post -op Vital signs reviewed and stable  Post vital signs: Reviewed and stable  Last Vitals:  Vitals Value Taken Time  BP    Temp    Pulse    Resp    SpO2      Last Pain:  Vitals:   08/09/18 0808  TempSrc: Oral  PainSc: 0-No pain         Complications: No apparent anesthesia complications

## 2018-08-09 NOTE — CV Procedure (Signed)
Procedure:   DCCV  Indication:  Symptomatic atrial fibrillation  Procedure Note:  The patient signed informed consent.  He has had had therapeutic anticoagulation with rivaroxaban greater than 3 weeks.  Anesthesia was administered by Dr. Valma Cava.  Adequate airway was maintained throughout and vital followed per protocol.  He was cardioverted x 2 with 120 J then 150 J of biphasic synchronized energy.  He converted to NSR.  There were no apparent complications.  The patient had normal neuro status and respiratory status post procedure with vitals stable as recorded elsewhere.    Follow up:  He has follow up with cardiology.  He will continue on current medical therapy.    Buford Dresser, MD PhD 08/09/2018 9:03 AM

## 2018-08-09 NOTE — Interval H&P Note (Signed)
History and Physical Interval Note:  08/09/2018 8:46 AM  Damon [name]  has presented today for surgery, with the diagnosis of atrial fibrillation  The various methods of treatment have been discussed with the patient and family. After consideration of risks, benefits and other options for treatment, the patient has consented to  Procedure(s): CARDIOVERSION (N/A) as a surgical intervention .  The patient's history has been reviewed, patient examined, no change in status, stable for surgery.  I have reviewed the patient's chart and labs.  Questions were answered to the patient's satisfaction.     Wanona Stare Harrell Gave

## 2018-08-11 ENCOUNTER — Encounter (HOSPITAL_COMMUNITY): Payer: Self-pay | Admitting: Cardiology

## 2018-08-13 NOTE — Addendum Note (Signed)
Encounter addended by: Sherran Needs, NP on: 08/13/2018 8:32 AM  Actions taken: LOS modified

## 2018-08-16 ENCOUNTER — Telehealth (HOSPITAL_COMMUNITY): Payer: Self-pay | Admitting: Emergency Medicine

## 2018-08-16 ENCOUNTER — Ambulatory Visit (HOSPITAL_COMMUNITY): Payer: Medicare Other | Admitting: Nurse Practitioner

## 2018-08-16 NOTE — Telephone Encounter (Signed)
Reaching out to patient to offer assistance regarding upcoming cardiac imaging study; pt verbalizes understanding of appt date/time, parking situation and where to check in, pre-test NPO status and medications ordered, and verified current allergies; name and call back number provided for further questions should they arise Aritza Brunet RN Navigator Cardiac Imaging [facility] [phone] office [phone] cell 

## 2018-08-17 ENCOUNTER — Other Ambulatory Visit (HOSPITAL_COMMUNITY): Payer: Self-pay | Admitting: Nurse Practitioner

## 2018-08-18 ENCOUNTER — Ambulatory Visit (HOSPITAL_COMMUNITY)
Admission: RE | Admit: 2018-08-18 | Discharge: 2018-08-18 | Disposition: A | Discharge status: Home / Self Care | Payer: Medicare Other | Source: Ambulatory Visit | Attending: Internal Medicine | Admitting: Internal Medicine

## 2018-08-18 ENCOUNTER — Other Ambulatory Visit (HOSPITAL_COMMUNITY): Payer: Self-pay | Admitting: *Deleted

## 2018-08-18 ENCOUNTER — Ambulatory Visit (HOSPITAL_COMMUNITY): Admission: RE | Admit: 2018-08-18 | Payer: Medicare Other | Source: Ambulatory Visit

## 2018-08-18 DIAGNOSIS — I4819 Other persistent atrial fibrillation: Secondary | ICD-10-CM | POA: Diagnosis not present

## 2018-08-18 MED ORDER — DILTIAZEM HCL ER COATED BEADS 180 MG PO CP24: 180.0000 mg | ORAL_CAPSULE | Freq: Every day | ORAL | 3 refills | Status: DC

## 2018-08-18 MED ORDER — IOPAMIDOL (ISOVUE-370) INJECTION 76%
80.0000 mL | Freq: Once | INTRAVENOUS | Status: AC | PRN
  Administered 2018-08-18: 80 mL via INTRAVENOUS

## 2018-08-24 NOTE — Anesthesia Preprocedure Evaluation (Addendum)
Anesthesia Evaluation  Patient identified by MRN, date of birth, ID band Patient awake    Reviewed: Allergy & Precautions, H&P , NPO status , Patient's Chart, lab work & pertinent test results, reviewed documented beta blocker date and time   Airway Mallampati: II  TM Distance: >3 FB Neck ROM: Full    Dental no notable dental hx. (+) Teeth Intact, Dental Advisory Given   Pulmonary sleep apnea and Continuous Positive Airway Pressure Ventilation , former smoker,    Pulmonary exam normal breath sounds clear to auscultation       Cardiovascular Exercise Tolerance: Good hypertension, Pt. on medications and Pt. on home beta blockers + CAD and + Peripheral Vascular Disease  + dysrhythmias Atrial Fibrillation  Rhythm:Regular Rate:Normal     Neuro/Psych negative neurological ROS  negative psych ROS   GI/Hepatic Neg liver ROS, GERD  ,  Endo/Other  negative endocrine ROS  Renal/GU negative Renal ROS  negative genitourinary   Musculoskeletal   Abdominal   Peds  Hematology negative hematology ROS (+)   Anesthesia Other Findings   Reproductive/Obstetrics negative OB ROS                            Anesthesia Physical Anesthesia Plan  ASA: III  Anesthesia Plan: General   Post-op Pain Management:    Induction: Intravenous  PONV Risk Score and Plan: 3 and Ondansetron, Dexamethasone and Midazolam  Airway Management Planned: Oral ETT  Additional Equipment:   Intra-op Plan:   Post-operative Plan: Extubation in OR  Informed Consent: I have reviewed the patients History and Physical, chart, labs and discussed the procedure including the risks, benefits and alternatives for the proposed anesthesia with the patient or authorized representative who has indicated his/her understanding and acceptance.     Dental advisory given  Plan Discussed with: CRNA  Anesthesia Plan Comments:          Anesthesia Quick Evaluation

## 2018-08-25 ENCOUNTER — Ambulatory Visit (HOSPITAL_COMMUNITY): Payer: Medicare Other | Admitting: Anesthesiology

## 2018-08-25 ENCOUNTER — Other Ambulatory Visit: Payer: Self-pay

## 2018-08-25 ENCOUNTER — Ambulatory Visit (HOSPITAL_COMMUNITY)
Admission: RE | Admit: 2018-08-25 | Discharge: 2018-08-26 | Disposition: A | Discharge status: Home / Self Care | Payer: Medicare Other | Attending: Internal Medicine | Admitting: Internal Medicine

## 2018-08-25 ENCOUNTER — Encounter (HOSPITAL_COMMUNITY)
Admission: RE | Disposition: A | Discharge status: Home / Self Care | Payer: Self-pay | Source: Home / Self Care | Attending: Internal Medicine

## 2018-08-25 ENCOUNTER — Encounter (HOSPITAL_COMMUNITY): Payer: Self-pay | Admitting: Certified Registered Nurse Anesthetist

## 2018-08-25 DIAGNOSIS — G4733 Obstructive sleep apnea (adult) (pediatric): Secondary | ICD-10-CM | POA: Insufficient documentation

## 2018-08-25 DIAGNOSIS — E785 Hyperlipidemia, unspecified: Secondary | ICD-10-CM | POA: Insufficient documentation

## 2018-08-25 DIAGNOSIS — Z7951 Long term (current) use of inhaled steroids: Secondary | ICD-10-CM | POA: Insufficient documentation

## 2018-08-25 DIAGNOSIS — I10 Essential (primary) hypertension: Secondary | ICD-10-CM | POA: Insufficient documentation

## 2018-08-25 DIAGNOSIS — Z8249 Family history of ischemic heart disease and other diseases of the circulatory system: Secondary | ICD-10-CM | POA: Diagnosis not present

## 2018-08-25 DIAGNOSIS — Z79899 Other long term (current) drug therapy: Secondary | ICD-10-CM | POA: Diagnosis not present

## 2018-08-25 DIAGNOSIS — H9193 Unspecified hearing loss, bilateral: Secondary | ICD-10-CM | POA: Diagnosis not present

## 2018-08-25 DIAGNOSIS — Z87891 Personal history of nicotine dependence: Secondary | ICD-10-CM | POA: Insufficient documentation

## 2018-08-25 DIAGNOSIS — I051 Rheumatic mitral insufficiency: Secondary | ICD-10-CM | POA: Insufficient documentation

## 2018-08-25 DIAGNOSIS — I739 Peripheral vascular disease, unspecified: Secondary | ICD-10-CM | POA: Diagnosis not present

## 2018-08-25 DIAGNOSIS — R7303 Prediabetes: Secondary | ICD-10-CM | POA: Insufficient documentation

## 2018-08-25 DIAGNOSIS — Z955 Presence of coronary angioplasty implant and graft: Secondary | ICD-10-CM | POA: Diagnosis not present

## 2018-08-25 DIAGNOSIS — Z7901 Long term (current) use of anticoagulants: Secondary | ICD-10-CM | POA: Insufficient documentation

## 2018-08-25 DIAGNOSIS — I4819 Other persistent atrial fibrillation: Secondary | ICD-10-CM | POA: Insufficient documentation

## 2018-08-25 HISTORY — PX: ATRIAL FIBRILLATION ABLATION: EP1191

## 2018-08-25 LAB — POCT ACTIVATED CLOTTING TIME
Activated Clotting Time: 290 seconds
Activated Clotting Time: 313 seconds
Activated Clotting Time: 164 seconds

## 2018-08-25 SURGERY — ATRIAL FIBRILLATION ABLATION
Anesthesia: General

## 2018-08-25 MED ORDER — BUPIVACAINE HCL (PF) 0.25 % IJ SOLN
INTRAMUSCULAR | Status: AC
  Filled 2018-08-25: qty 30

## 2018-08-25 MED ORDER — SODIUM CHLORIDE 0.9% FLUSH
3.0000 mL | Freq: Two times a day (BID) | INTRAVENOUS | Status: DC
  Administered 2018-08-25 (×2): 3 mL via INTRAVENOUS

## 2018-08-25 MED ORDER — HYDROCODONE-ACETAMINOPHEN 5-325 MG PO TABS: 1.0000 | ORAL_TABLET | ORAL | Status: DC | PRN

## 2018-08-25 MED ORDER — PROPOFOL 10 MG/ML IV BOLUS
INTRAVENOUS | Status: DC | PRN
  Administered 2018-08-25: 110 mg via INTRAVENOUS

## 2018-08-25 MED ORDER — ISOPROTERENOL HCL 0.2 MG/ML IJ SOLN
INTRAMUSCULAR | Status: AC
  Filled 2018-08-25: qty 5

## 2018-08-25 MED ORDER — SODIUM CHLORIDE 0.9 % IV SOLN: 250.0000 mL | INTRAVENOUS | Status: DC | PRN

## 2018-08-25 MED ORDER — BISOPROLOL-HYDROCHLOROTHIAZIDE 10-6.25 MG PO TABS
1.0000 | ORAL_TABLET | Freq: Every day | ORAL | Status: DC
  Administered 2018-08-26: 1 via ORAL
  Filled 2018-08-25: qty 1

## 2018-08-25 MED ORDER — PROTAMINE SULFATE 10 MG/ML IV SOLN
INTRAVENOUS | Status: DC | PRN
  Administered 2018-08-25: 40 mg via INTRAVENOUS

## 2018-08-25 MED ORDER — ALPRAZOLAM 0.5 MG PO TABS
0.5000 mg | ORAL_TABLET | Freq: Every evening | ORAL | Status: DC | PRN
  Administered 2018-08-25: 0.5 mg via ORAL
  Filled 2018-08-25: qty 1

## 2018-08-25 MED ORDER — ISOPROTERENOL HCL 0.2 MG/ML IJ SOLN
INTRAVENOUS | Status: DC | PRN
  Administered 2018-08-25: 10 ug/min via INTRAVENOUS

## 2018-08-25 MED ORDER — ACETAMINOPHEN 500 MG PO TABS
1000.0000 mg | ORAL_TABLET | Freq: Once | ORAL | Status: AC
  Administered 2018-08-25: 1000 mg via ORAL
  Filled 2018-08-25 (×2): qty 2

## 2018-08-25 MED ORDER — ADENOSINE 6 MG/2ML IV SOLN
INTRAVENOUS | Status: AC
  Filled 2018-08-25: qty 8

## 2018-08-25 MED ORDER — BUPIVACAINE HCL (PF) 0.25 % IJ SOLN
INTRAMUSCULAR | Status: DC | PRN
  Administered 2018-08-25: 30 mL

## 2018-08-25 MED ORDER — HEPARIN (PORCINE) IN NACL 1000-0.9 UT/500ML-% IV SOLN
INTRAVENOUS | Status: DC | PRN
  Administered 2018-08-25: 500 mL

## 2018-08-25 MED ORDER — LIDOCAINE 2% (20 MG/ML) 5 ML SYRINGE
INTRAMUSCULAR | Status: DC | PRN
  Administered 2018-08-25: 60 mg via INTRAVENOUS

## 2018-08-25 MED ORDER — ONDANSETRON HCL 4 MG/2ML IJ SOLN: 4.0000 mg | Freq: Four times a day (QID) | INTRAMUSCULAR | Status: DC | PRN

## 2018-08-25 MED ORDER — HEPARIN SODIUM (PORCINE) 1000 UNIT/ML IJ SOLN
INTRAMUSCULAR | Status: AC
  Filled 2018-08-25: qty 2

## 2018-08-25 MED ORDER — DEXAMETHASONE SODIUM PHOSPHATE 10 MG/ML IJ SOLN
INTRAMUSCULAR | Status: DC | PRN
  Administered 2018-08-25: 5 mg via INTRAVENOUS

## 2018-08-25 MED ORDER — HEPARIN (PORCINE) IN NACL 2000-0.9 UNIT/L-% IV SOLN
INTRAVENOUS | Status: AC
  Filled 2018-08-25: qty 1000

## 2018-08-25 MED ORDER — HEPARIN SODIUM (PORCINE) 1000 UNIT/ML IJ SOLN
INTRAMUSCULAR | Status: DC | PRN
  Administered 2018-08-25: 12000 [IU] via INTRAVENOUS
  Administered 2018-08-25: 1000 [IU] via INTRAVENOUS

## 2018-08-25 MED ORDER — ONDANSETRON HCL 4 MG/2ML IJ SOLN
INTRAMUSCULAR | Status: DC | PRN
  Administered 2018-08-25: 4 mg via INTRAVENOUS

## 2018-08-25 MED ORDER — HEPARIN SODIUM (PORCINE) 1000 UNIT/ML IJ SOLN
INTRAMUSCULAR | Status: DC | PRN
  Administered 2018-08-25: 3000 [IU] via INTRAVENOUS

## 2018-08-25 MED ORDER — SODIUM CHLORIDE 0.9 % IV SOLN
INTRAVENOUS | Status: DC
  Administered 2018-08-25: 07:00:00 via INTRAVENOUS

## 2018-08-25 MED ORDER — ROCURONIUM BROMIDE 10 MG/ML (PF) SYRINGE
PREFILLED_SYRINGE | INTRAVENOUS | Status: DC | PRN
  Administered 2018-08-25: 20 mg via INTRAVENOUS
  Administered 2018-08-25: 50 mg via INTRAVENOUS
  Administered 2018-08-25: 30 mg via INTRAVENOUS

## 2018-08-25 MED ORDER — SUGAMMADEX SODIUM 200 MG/2ML IV SOLN
INTRAVENOUS | Status: DC | PRN
  Administered 2018-08-25: 200 mg via INTRAVENOUS

## 2018-08-25 MED ORDER — ADENOSINE 6 MG/2ML IV SOLN
INTRAVENOUS | Status: DC | PRN
  Administered 2018-08-25 (×2): 12 mg via INTRAVENOUS

## 2018-08-25 MED ORDER — FENTANYL CITRATE (PF) 250 MCG/5ML IJ SOLN
INTRAMUSCULAR | Status: DC | PRN
  Administered 2018-08-25: 100 ug via INTRAVENOUS

## 2018-08-25 MED ORDER — SODIUM CHLORIDE 0.9% FLUSH: 3.0000 mL | INTRAVENOUS | Status: DC | PRN

## 2018-08-25 MED ORDER — ACETAMINOPHEN 325 MG PO TABS: 650.0000 mg | ORAL_TABLET | ORAL | Status: DC | PRN

## 2018-08-25 MED ORDER — SODIUM CHLORIDE 0.9 % IV SOLN
INTRAVENOUS | Status: DC | PRN
  Administered 2018-08-25: 20 ug/min via INTRAVENOUS

## 2018-08-25 MED ORDER — RIVAROXABAN 20 MG PO TABS
20.0000 mg | ORAL_TABLET | Freq: Every day | ORAL | Status: DC
  Administered 2018-08-25: 20 mg via ORAL
  Filled 2018-08-25: qty 1

## 2018-08-25 SURGICAL SUPPLY — 17 items
BLANKET WARM UNDERBOD FULL ACC (MISCELLANEOUS) ×3 IMPLANT
CATH MAPPNG PENTARAY F 2-6-2MM (CATHETERS) ×1 IMPLANT
CATH NAVISTAR SMARTTOUCH DF (ABLATOR) ×3 IMPLANT
CATH SOUNDSTAR ECO REPROCESSED (CATHETERS) ×3 IMPLANT
CATH WEBSTER BI DIR CS D-F CRV (CATHETERS) ×3 IMPLANT
COVER SWIFTLINK CONNECTOR (BAG) ×3 IMPLANT
NEEDLE BAYLIS TRANSSEPTAL 71CM (NEEDLE) ×3 IMPLANT
PACK EP LATEX FREE (CUSTOM PROCEDURE TRAY) ×2
PACK EP LF (CUSTOM PROCEDURE TRAY) ×1 IMPLANT
PAD PRO RADIOLUCENT 2001M-C (PAD) ×3 IMPLANT
PATCH CARTO3 (PAD) ×3 IMPLANT
PENTARAY F 2-6-2MM (CATHETERS) ×3
SHEATH AVANTI 11F 11CM (SHEATH) ×3 IMPLANT
SHEATH PINNACLE 7F 10CM (SHEATH) ×6 IMPLANT
SHEATH PINNACLE 9F 10CM (SHEATH) ×3 IMPLANT
SHEATH SWARTZ TS SL2 63CM 8.5F (SHEATH) ×3 IMPLANT
TUBING SMART ABLATE COOLFLOW (TUBING) ×3 IMPLANT

## 2018-08-25 NOTE — Anesthesia Procedure Notes (Signed)
Procedure Name: Intubation Date/Time: 08/25/2018 8:57 AM Performed by: Julieta Bellini, CRNA Pre-anesthesia Checklist: Patient identified, Emergency Drugs available, Suction available and Patient being monitored Patient Re-evaluated:Patient Re-evaluated prior to induction Oxygen Delivery Method: Circle system utilized Preoxygenation: Pre-oxygenation with 100% oxygen Induction Type: IV induction Ventilation: Mask ventilation without difficulty and Oral airway inserted - appropriate to patient size Laryngoscope Size: 4 and Mac Grade View: Grade I Tube type: Oral Tube size: 7.5 mm Number of attempts: 1 Airway Equipment and Method: Stylet Placement Confirmation: ETT inserted through vocal cords under direct vision,  positive ETCO2 and breath sounds checked- equal and bilateral Secured at: 22 cm Tube secured with: Tape Dental Injury: Teeth and Oropharynx as per pre-operative assessment

## 2018-08-25 NOTE — Progress Notes (Signed)
RT Note:  Brought patient a CPAP to use for the night.  Patient said he uses a medium, full-face mask at home.   Patient also stated that he wanted his pressure set at 8.  I hooked machine up and let patient adjust his mask to his comfort.  After setting up machine, I asked if he wanted to go on now, but patient just wanted me to show him how to cut machine on.  He stated that he would manage his machine.

## 2018-08-25 NOTE — Interval H&P Note (Signed)
History and Physical Interval Note:  08/25/2018 7:19 AM  Damon [name]  has presented today for surgery, with the diagnosis of afib  The various methods of treatment have been discussed with the patient and family. After consideration of risks, benefits and other options for treatment, the patient has consented to  Procedure(s): ATRIAL FIBRILLATION ABLATION (N/A) as a surgical intervention .  The patient's history has been reviewed, patient examined, no change in status, stable for surgery.  I have reviewed the patient's chart and labs.  Questions were answered to the patient's satisfaction.     Therapeutic strategies for afib including medicine and repeat ablation were discussed in detail with the patient today. Risk, benefits, and alternatives to EP study and radiofrequency ablation for afib were also discussed in detail today. These risks include but are not limited to stroke, bleeding, vascular damage, tamponade, perforation, damage to the esophagus, lungs, and other structures, pulmonary vein stenosis, worsening renal function, and death. The patient understands these risk and wishes to proceed.   Cardiac CT reviewed with him today.  He will discuss calcium score with Dr Angelena Form on follow-up.  Prior cath and myoview reviewed. He reports compliance with xarelto without interruption.   [name] MD, [facility] 08/25/2018 7:20 AM

## 2018-08-25 NOTE — Progress Notes (Signed)
Site area: rt groin 3 fv sheaths Site Prior to Removal:  Level 0 Pressure Applied For: 30 minutes Manual:   yes Patient Status During Pull:  stable Post Pull Site:  Level 0 Post Pull Instructions Given:  yes Post Pull Pulses Present: rt dp palpable Dressing Applied:  Gauze and tegaderm Bedrest begins @ 1200 Comments:

## 2018-08-25 NOTE — Discharge Summary (Addendum)
ELECTROPHYSIOLOGY PROCEDURE DISCHARGE SUMMARY    Patient ID: Damon [name],  MRN: [id number], DOB/AGE: 74-Nov-1947 74 y.o.  Admit date: 08/25/2018 Discharge date: 08/26/2018  Primary Care Physician: [name], MD  Primary Cardiologist: Dr. Julianne Handler Electrophysiologist: [name], MD  Primary Discharge Diagnosis:  1. Persistent AFib     H/o prior CTI ablation, 2014, 2016     CHA2DS2Vasc is at least 2, on Xarelto, appropriately dosed  Secondary Discharge Diagnosis:  1. HTN 2. HLD 3. OSA w/CPAP   Procedures This Admission:  1.  Electrophysiology study and radiofrequency catheter ablation on 08/25/2018 by Dr [name].   This study demonstrated CONCLUSIONS: 1. Sinus rhythm upon presentation.   2. Intracardiac echo reveals a moderately enlarged sized left atrium with four separate pulmonary veins without evidence of pulmonary vein stenosis. 3. All four pulmonary veins were quiescent at baseline from prior ablation. Entrance and exit block were observed and persisted with adenosine challeng. 4. There was a small amount of conduction between the left superior and inferior pulmonary veins anteriorly along the carina which was ablated today. 5. Additional left atrial ablation was performed with a standard box lesion created along the posterior wall of the left atrium 6. CTI block confirmed from a prior ablation procedure 7. No early apparent complications.    Brief HPI: Damon [name] is a 73 y.o. male with a history of persistent atrial fibrillation.  Risks, benefits, and alternatives to catheter ablation of atrial fibrillation were reviewed with the patient who wished to proceed.  The patient underwent cardiac CT prior to the procedure which demonstrated no LAA thrombus.    Hospital Course:  The patient was admitted and underwent EPS/RFCA of atrial fibrillation with details as outlined above.  They were monitored on telemetry overnight which demonstrated SR, occ  PVC.   R groin was without complication on the day of discharge.  The patient feels well, denies any CP, SOB, or procedure site discomfort, he was examined by Dr. [name] and considered to be stable for discharge.  Wound care and restrictions were reviewed with the patient.  The patient will be seen back by Roderic Palau, NP in 4 weeks and Dr [name] in 12 weeks for post ablation follow up.    Physical Exam: Vitals:   08/25/18 1315 08/25/18 1345 08/25/18 2122 08/26/18 0527  BP: 110/63 114/72 123/72 120/70  Pulse: 81 73 79 63  Resp: 12 15    Temp:  98.4 F (36.9 C) 98.1 F (36.7 C) 97.7 F (36.5 C)  TempSrc:  Oral Oral Oral  SpO2: 95% 91% 92% 93%  Weight:    92.1 kg  Height:        GEN- The patient is well appearing, alert and oriented x 3 today.   HEENT: normocephalic, atraumatic; sclera clear, conjunctiva pink; hearing intact; oropharynx clear; neck supple  Lungs- CTA b/l, normal work of breathing.  No wheezes, rales, rhonchi Heart- RRR, no murmurs, rubs or gallops  GI- soft, non-tender, non-distended Extremities- no clubbing, cyanosis, or edema; R DP/PT  2+ bilaterally, groin without hematoma/bruit MS- no significant deformity or atrophy Skin- warm and dry, no rash or lesion Psych- euthymic mood, full affect Neuro- strength and sensation are intact   Labs:   Lab Results  Component Value Date   WBC 7.9 08/02/2018   HGB 17.3 (H) 08/02/2018   HCT 49.3 08/02/2018   MCV 88.0 08/02/2018   PLT 263 08/02/2018   No results for input(s): NA, K, CL, CO2,  BUN, CREATININE, CALCIUM, PROT, BILITOT, ALKPHOS, ALT, AST, GLUCOSE in the last 168 hours.  Invalid input(s): LABALBU   Discharge Medications:  Allergies as of 08/26/2018      Reactions   Zetia [ezetimibe] Other (See Comments)   myalgias      Medication List    TAKE these medications   ALPRAZolam 1 MG tablet Commonly known as:  XANAX Take 1/2 to 1 tab at hour of sleep What changed:    how much to take  how to take  this  when to take this  reasons to take this   bisoprolol-hydrochlorothiazide 10-6.25 MG tablet Commonly known as:  ZIAC TAKE 1 TABLET BY MOUTH ONCE DAILY   diltiazem 180 MG 24 hr capsule Commonly known as:  CARDIZEM CD Take 1 capsule (180 mg total) by mouth daily.   multivitamin with minerals Tabs tablet Take 1 tablet by mouth daily. FOR MEN   NITROSTAT 0.4 MG SL tablet Generic drug:  nitroGLYCERIN DISSOLVE ONE TABLET UNDER THE TONGUE EVERY 5 MINUTES AS NEEDED FOR CHEST PAIN.  DO NOT EXCEED A TOTAL OF 3 DOSES IN 15 MINUTES What changed:  See the new instructions.   pantoprazole 40 MG tablet Commonly known as:  PROTONIX Take 1 tablet (40 mg total) by mouth daily.   REFRESH OP Place 1 drop into both eyes daily as needed (dry eyes).   rivaroxaban 20 MG Tabs tablet Commonly known as:  XARELTO TAKE ONE TABLET BY MOUTH ONCE DAILY WITH SUPPER   rosuvastatin 20 MG tablet Commonly known as:  CRESTOR Take 20 mg by mouth every evening.   Vitamin D3 125 MCG (5000 UT) Caps Take 10,000 Units by mouth daily.       Disposition:  Home  Discharge Instructions    Diet - low sodium heart healthy   Complete by:  As directed    Increase activity slowly   Complete by:  As directed      Follow-up Information    [facility].   Specialty:  Cardiology Why:  09/23/2018 @ 10:00AM Contact information: [address] [id number] [place] [address] [phone]       [name], MD Follow up.   Specialty:  Cardiology Why:  11/29/2018 @ 9:15AM Contact information: [place] [address] [phone]           Duration of Discharge Encounter: Greater than 30 minutes including physician time.  Signed, Tommye Standard, PA-C 08/26/2018 9:15 AM  I have seen, examined the patient, and reviewed the above assessment and plan.  Changes to above are made where necessary.  On exam, RRR.  Doing well  post ablation.  Dc to home.  Follow-up in AF clinic in 4 weeks.  Co Sign: [name], MD 08/26/2018 12:42 PM

## 2018-08-25 NOTE — Progress Notes (Signed)
Pt states he tried to shave himself but "didn't do a very good job" NT, [employer] reports many nicks on pt pubic area more on the right.

## 2018-08-25 NOTE — Transfer of Care (Signed)
Immediate Anesthesia Transfer of Care Note  Patient: Damon [name]  Procedure(s) Performed: ATRIAL FIBRILLATION ABLATION (N/A )  Patient Location: Cath Lab  Anesthesia Type:General  Level of Consciousness: awake, alert , oriented and patient cooperative  Airway & Oxygen Therapy: Patient Spontanous Breathing and Patient connected to nasal cannula oxygen  Post-op Assessment: Report given to RN, Post -op Vital signs reviewed and stable and Patient moving all extremities X 4  Post vital signs: Reviewed and stable  Last Vitals:  Vitals Value Taken Time  BP 117/66 08/25/2018 10:54 AM  Temp    Pulse 60 08/25/2018 10:57 AM  Resp 13 08/25/2018 10:57 AM  SpO2 93 % 08/25/2018 10:57 AM  Vitals shown include unvalidated device data.  Last Pain:  Vitals:   08/25/18 1054  TempSrc:   PainSc: 0-No pain         Complications: No apparent anesthesia complications

## 2018-08-25 NOTE — Anesthesia Postprocedure Evaluation (Signed)
Anesthesia Post Note  Patient: Damon [name]  Procedure(s) Performed: ATRIAL FIBRILLATION ABLATION (N/A )     Patient location during evaluation: Cath Lab Anesthesia Type: General Level of consciousness: awake and alert Pain management: pain level controlled Vital Signs Assessment: post-procedure vital signs reviewed and stable Respiratory status: spontaneous breathing, nonlabored ventilation and respiratory function stable Cardiovascular status: blood pressure returned to baseline and stable Postop Assessment: no apparent nausea or vomiting Anesthetic complications: no    Last Vitals:  Vitals:   08/25/18 1155 08/25/18 1200  BP: 118/66 (!) 122/57  Pulse: 64 64  Resp: 13 14  Temp:    SpO2: 95% 95%    Last Pain:  Vitals:   08/25/18 1135  TempSrc: Temporal  PainSc:                  [name],W. EDMOND

## 2018-08-26 DIAGNOSIS — I4819 Other persistent atrial fibrillation: Secondary | ICD-10-CM | POA: Diagnosis not present

## 2018-08-26 DIAGNOSIS — I10 Essential (primary) hypertension: Secondary | ICD-10-CM | POA: Diagnosis not present

## 2018-08-26 DIAGNOSIS — G4733 Obstructive sleep apnea (adult) (pediatric): Secondary | ICD-10-CM | POA: Diagnosis not present

## 2018-08-26 DIAGNOSIS — E785 Hyperlipidemia, unspecified: Secondary | ICD-10-CM | POA: Diagnosis not present

## 2018-08-26 MED ORDER — PANTOPRAZOLE SODIUM 40 MG PO TBEC: 40.0000 mg | DELAYED_RELEASE_TABLET | Freq: Every day | ORAL | 0 refills | Status: DC

## 2018-08-26 NOTE — Discharge Instructions (Signed)
Post procedure care instructions No driving for 4 days. No lifting over 5 lbs for 1 week. No vigorous or sexual activity for 1 week. You may return to work on 09/01/2018. Keep procedure site clean & dry. If you notice increased pain, swelling, bleeding or pus, call/return!  You may shower, but no soaking baths/hot tubs/pools for 1 week.   You have an appointment set up with the [facility].  Multiple studies have shown that being followed by a dedicated atrial fibrillation clinic in addition to the standard care you receive from your other physicians improves health. We believe that enrollment in the atrial fibrillation clinic will allow [place] to better care for you.   The phone number to the [facility] is [phone]. The clinic is staffed Monday through Friday from 8:30am to 5pm.  Parking Directions: The clinic is located in the Heart and Vascular Building connected to [facility]. 1)From [address] turn on to [address] and go to the 3rd entrance  (Heart and Vascular entrance) on the right. 2)Look to the right for Heart &Vascular Parking Garage. 3)A code for the entrance is required please call the clinic to receive this.   4)Take the elevators to the 1st floor. Registration is in the room with the glass walls at the end of the hallway.  If you have any trouble parking or locating the clinic, please dont hesitate to call [phone].     Information on my medicine - XARELTO (Rivaroxaban)  This medication education was reviewed with me or my healthcare representative as part of my discharge preparation.   Why was Xarelto prescribed for you? Xarelto was prescribed for you to reduce the risk of a blood clot forming that can cause a stroke if you have a medical condition called atrial fibrillation (a type of irregular heartbeat).  What do you need to know about xarelto ? Take your Xarelto ONCE DAILY at the same time every day with your  evening meal. If you have difficulty swallowing the tablet whole, you may crush it and mix in applesauce just prior to taking your dose.  Take Xarelto exactly as prescribed by your doctor and DO NOT stop taking Xarelto without talking to the doctor who prescribed the medication.  Stopping without other stroke prevention medication to take the place of Xarelto may increase your risk of developing a clot that causes a stroke.  Refill your prescription before you run out.  After discharge, you should have regular check-up appointments with your healthcare provider that is prescribing your Xarelto.  In the future your dose may need to be changed if your kidney function or weight changes by a significant amount.  What do you do if you miss a dose? If you are taking Xarelto ONCE DAILY and you miss a dose, take it as soon as you remember on the same day then continue your regularly scheduled once daily regimen the next day. Do not take two doses of Xarelto at the same time or on the same day.   Important Safety Information A possible side effect of Xarelto is bleeding. You should call your healthcare provider right away if you experience any of the following: ? Bleeding from an injury or your nose that does not stop. ? Unusual colored urine (red or dark brown) or unusual colored stools (red or black). ? Unusual bruising for unknown reasons. ? A serious fall or if you hit your head (even if there is no bleeding).  Some medicines  may interact with Xarelto and might increase your risk of bleeding while on Xarelto. To help avoid this, consult your healthcare provider or pharmacist prior to using any new prescription or non-prescription medications, including herbals, vitamins, non-steroidal anti-inflammatory drugs (NSAIDs) and supplements.  This website has more information on Xarelto: [email or web address]

## 2018-09-02 ENCOUNTER — Ambulatory Visit (INDEPENDENT_AMBULATORY_CARE_PROVIDER_SITE_OTHER): Payer: Medicare Other | Admitting: Cardiovascular Disease

## 2018-09-02 ENCOUNTER — Encounter: Payer: Self-pay | Admitting: Cardiovascular Disease

## 2018-09-02 VITALS — BP 122/76 | HR 64 | Ht 69.0 in | Wt 205.4 lb

## 2018-09-02 DIAGNOSIS — I10 Essential (primary) hypertension: Secondary | ICD-10-CM | POA: Diagnosis not present

## 2018-09-02 DIAGNOSIS — I2510 Atherosclerotic heart disease of native coronary artery without angina pectoris: Secondary | ICD-10-CM | POA: Diagnosis not present

## 2018-09-02 DIAGNOSIS — E7800 Pure hypercholesterolemia, unspecified: Secondary | ICD-10-CM

## 2018-09-02 DIAGNOSIS — I480 Paroxysmal atrial fibrillation: Secondary | ICD-10-CM | POA: Diagnosis not present

## 2018-09-02 NOTE — Patient Instructions (Signed)
Medication Instructions:  Your physician recommends that you continue on your current medications as directed. Please refer to the Current Medication list given to you today.  If you need a refill on your cardiac medications before your next appointment, please call your pharmacy.   Lab work: none If you have labs (blood work) drawn today and your tests are completely normal, you will receive your results only by: . MyChart Message (if you have MyChart) OR . A paper copy in the mail If you have any lab test that is abnormal or we need to change your treatment, we will call you to review the results.  Testing/Procedures: none  Follow-Up: At [facility], you and your health needs are our priority.  As part of our continuing mission to provide you with exceptional heart care, we have created designated Provider Care Teams.  These Care Teams include your primary Cardiologist (physician) and Advanced Practice Providers (APPs -  Physician Assistants and Nurse Practitioners) who all work together to provide you with the care you need, when you need it. You will need a follow up appointment in 12 months.  Please call our office 4 months in advance to schedule this appointment.  You may see Christopher McAlhany, MD or one of the following Advanced Practice Providers on your designated Care Team:   Brittainy Simmons, PA-C Dayna Dunn, PA-C . Michele Lenze, PA-C  Any Other Special Instructions Will Be Listed Below (If Applicable).    

## 2018-09-02 NOTE — Progress Notes (Signed)
Chief Complaint  Patient presents with  . Follow-up    CAD   History of Present Illness: 73 yo male with history of CAD, HTN and paroxysmal atrial fibrillation who is here today for follow up of his CAD. His atrial fibrillation is followed by Dr. [name]. I performed a cardiac cath in May 2015 with rotational atherectomy of the proximal Circumflex followed by placement of a DES in the Circumflex. FFR of the LAD was not flow limiting. Nuclear stress test in September 2017 without evidence of ischemia. Echo December 2019 with normal LV systolic function. He underwent cardioversion in November 2019, January 2020 and atrial fibrillation ablation on 08/26/18.   He is here today for follow up. The patient denies any chest pain, dyspnea, palpitations, lower extremity edema, orthopnea, PND, dizziness, near syncope or syncope.   Primary Care Physician: [name], MD  Past Medical History:  Diagnosis Date  . Bladder cancer ([place])   . Hearing loss of both ears   . History of colon polyps    BENIGN  . Hyperlipidemia   . Hypertension   . Mitral regurgitation   . OSA on CPAP    cpap settign of 13  . PAD (peripheral artery disease) (H[facility])    ABI--  RIGHT SIDE NORMAL LEFT SIDE MODERATELY REDUCED W/ DISTAL LEFT SFA STENOSIS//  MILD CALDICATION  . PAF (paroxysmal atrial fibrillation) ([place])     a. s/p PVI 2014; b. s/p redo PVI and CTI 01/2015  . Pre-diabetes   . Vitamin D deficiency     Past Surgical History:  Procedure Laterality Date  . ABLATION OF DYSRHYTHMIC FOCUS  01/26/2015  . ATRIAL FIBRILLATION ABLATION N/A 07/27/2012   Procedure: ATRIAL FIBRILLATION ABLATION;  Surgeon: [name], MD;  Location: [facility] CATH LAB;  Service: Cardiovascular;  Laterality: N/A;  . ATRIAL FIBRILLATION ABLATION N/A 08/25/2018   Procedure: ATRIAL FIBRILLATION ABLATION;  Surgeon: [name], MD;  Location: [place] CV LAB;  Service: Cardiovascular;  Laterality: N/A;  . CARDIAC ELECTROPHYSIOLOGY STUDY AND  ABLATION  07-27-2012  DR ALLRED   SUCCESSFUL ABLATION OF A-FIB  . CARDIOVERSION  08/06/2012   Procedure: CARDIOVERSION;  Surgeon: [name], MD;  Location: [facility] ENDOSCOPY;  Service: Cardiovascular;  Laterality: N/A;  . CARDIOVERSION N/A 08/25/2014   Procedure: CARDIOVERSION;  Surgeon: Larey Dresser, MD;  Location: [place];  Service: Cardiovascular;  Laterality: N/A;  . CARDIOVERSION N/A 11/13/2014   Procedure: CARDIOVERSION;  Surgeon: Sueanne Margarita, MD;  Location: [facility] ENDOSCOPY;  Service: Cardiovascular;  Laterality: N/A;  . CARDIOVERSION N/A 12/28/2014   Procedure: CARDIOVERSION;  Surgeon: [name], MD;  Location: [place];  Service: Cardiovascular;  Laterality: N/A;  . CARDIOVERSION N/A 01/30/2017   Procedure: CARDIOVERSION;  Surgeon: Josue Hector, MD;  Location: [facility] ENDOSCOPY;  Service: Cardiovascular;  Laterality: N/A;  . CARDIOVERSION N/A 04/06/2017   Procedure: CARDIOVERSION;  Surgeon: Dorothy Spark, MD;  Location: [facility] ENDOSCOPY;  Service: Cardiovascular;  Laterality: N/A;  . CARDIOVERSION N/A 05/25/2018   Procedure: CARDIOVERSION;  Surgeon: Skeet Latch, MD;  Location: [facility] ENDOSCOPY;  Service: Cardiovascular;  Laterality: N/A;  . CARDIOVERSION N/A 08/09/2018   Procedure: CARDIOVERSION;  Surgeon: Buford Dresser, MD;  Location: [facility] ENDOSCOPY;  Service: Cardiovascular;  Laterality: N/A;  . CATARACT EXTRACTION W/ INTRAOCULAR LENS  IMPLANT, BILATERAL    . COLONOSCOPY WITH PROPOFOL N/A 02/13/2015   Procedure: COLONOSCOPY WITH PROPOFOL;  Surgeon: Garlan Fair, MD;  Location: [facility] ENDOSCOPY;  Service: Endoscopy;  Laterality: N/A;  .  CYSTOSCOPY W/ RETROGRADES Bilateral 04/12/2013   Procedure: CYSTOSCOPY WITH RETROGRADE PYELOGRAM;  Surgeon: [name], MD;  Location: [facility];  Service: Urology;  Laterality: Bilateral;  . ELECTROPHYSIOLOGIC STUDY N/A 01/26/2015   Procedure: Atrial Fibrillation Ablation;  Surgeon: [name], MD;  Location: [place]  CV LAB;  Service: Cardiovascular;  Laterality: N/A;  . LEFT HEART CATHETERIZATION WITH CORONARY ANGIOGRAM N/A 11/24/2013   Procedure: LEFT HEART CATHETERIZATION WITH CORONARY ANGIOGRAM;  Surgeon: [name], MD;  Location: [facility] CATH LAB;  Service: Cardiovascular;  Laterality: N/A;  . PERCUTANEOUS CORONARY ROTOBLATOR INTERVENTION (PCI-R) N/A 11/28/2013   Procedure: PERCUTANEOUS CORONARY ROTOBLATOR INTERVENTION (PCI-R);  Surgeon: [name], MD;  Location: [facility] CATH LAB;  Service: Cardiovascular;  Laterality: N/A;  . PERCUTANEOUS CORONARY STENT INTERVENTION (PCI-S)  11/24/2013   Procedure: PERCUTANEOUS CORONARY STENT INTERVENTION (PCI-S);  Surgeon: [name], MD;  Location: [facility] CATH LAB;  Service: Cardiovascular;;  . TEE WITHOUT CARDIOVERSION  07/26/2012   Procedure: TRANSESOPHAGEAL ECHOCARDIOGRAM (TEE);  Surgeon: [name], MD;  Location: [facility] ENDOSCOPY;  Service: Cardiovascular;  Laterality: N/A;  . TEE WITHOUT CARDIOVERSION N/A 08/25/2014   Procedure: TRANSESOPHAGEAL ECHOCARDIOGRAM (TEE);  Surgeon: Larey Dresser, MD;  Location: [place];  Service: Cardiovascular;  Laterality: N/A;  . TEE WITHOUT CARDIOVERSION  01/26/2015   Procedure: Transesophageal Echocardiogram (Tee);  Surgeon: [name], MD;  Location: [place] CV LAB;  Service: Cardiovascular;;  . TONSILLECTOMY  age 73  . TOTAL HIP ARTHROPLASTY  07/25/2011   Procedure: TOTAL HIP ARTHROPLASTY ANTERIOR APPROACH;  Surgeon: Mcarthur Rossetti;  Location: [facility]S;  Service: Orthopedics;  Laterality: Right;  . TOTAL HIP ARTHROPLASTY Left 02-08-2010  . TRANSURETHRAL RESECTION OF BLADDER TUMOR WITH GYRUS (TURBT-GYRUS) N/A 04/12/2013   Procedure: TRANSURETHRAL RESECTION OF BLADDER TUMOR WITH GYRUS (TURBT-GYRUS);  Surgeon: [name], MD;  Location: [facility];  Service: Urology;  Laterality: N/A;    Current Outpatient Medications  Medication Sig Dispense Refill  . ALPRAZolam (XANAX) 1 MG  tablet Take 1/2 to 1 tab at hour of sleep 30 tablet 0  . bisoprolol-hydrochlorothiazide (ZIAC) 10-6.25 MG tablet TAKE 1 TABLET BY MOUTH ONCE DAILY 90 tablet 1  . Cholecalciferol (VITAMIN D3) 5000 units CAPS Take 10,000 Units by mouth daily.    Marland Kitchen diltiazem (CARDIZEM CD) 180 MG 24 hr capsule Take 1 capsule (180 mg total) by mouth daily. 30 capsule 3  . Multiple Vitamin (MULITIVITAMIN WITH MINERALS) TABS Take 1 tablet by mouth daily. FOR MEN    . NITROSTAT 0.4 MG SL tablet DISSOLVE ONE TABLET UNDER THE TONGUE EVERY 5 MINUTES AS NEEDED FOR CHEST PAIN.  DO NOT EXCEED A TOTAL OF 3 DOSES IN 15 MINUTES 25 tablet 6  . pantoprazole (PROTONIX) 40 MG tablet Take 1 tablet (40 mg total) by mouth daily. 45 tablet 0  . Polyvinyl Alcohol-Povidone (REFRESH OP) Place 1 drop into both eyes daily as needed (dry eyes).    . rivaroxaban (XARELTO) 20 MG TABS tablet TAKE ONE TABLET BY MOUTH ONCE DAILY WITH SUPPER 90 tablet 0  . rosuvastatin (CRESTOR) 20 MG tablet Take 20 mg by mouth every evening.     No current facility-administered medications for this visit.     Allergies  Allergen Reactions  . Zetia [Ezetimibe] Other (See Comments)    myalgias     Social History   Socioeconomic History  . Marital status: Married    Spouse name: Not on file  . Number of children: Not  on file  . Years of education: Not on file  . Highest education level: Not on file  Occupational History  . Not on file  Social Needs  . Financial resource strain: Not on file  . Food insecurity:    Worry: Not on file    Inability: Not on file  . Transportation needs:    Medical: Not on file    Non-medical: Not on file  Tobacco Use  . Smoking status: Former Smoker    Packs/day: 2.00    Years: 10.00    Pack years: 20.00    Types: Cigarettes    Last attempt to quit: 09/20/1981    Years since quitting: 36.9  . Smokeless tobacco: Never Used  Substance and Sexual Activity  . Alcohol use: No    Comment: pt states he has stopped  drinking alcohol  . Drug use: No  . Sexual activity: Not on file  Lifestyle  . Physical activity:    Days per week: Not on file    Minutes per session: Not on file  . Stress: Not on file  Relationships  . Social connections:    Talks on phone: Not on file    Gets together: Not on file    Attends religious service: Not on file    Active member of club or organization: Not on file    Attends meetings of clubs or organizations: Not on file    Relationship status: Not on file  . Intimate partner violence:    Fear of current or ex partner: Not on file    Emotionally abused: Not on file    Physically abused: Not on file    Forced sexual activity: Not on file  Other Topics Concern  . Not on file  Social History Narrative  . Not on file    Family History  Problem Relation Age of Onset  . Heart attack Mother   . Heart disease Mother   . Heart failure Father   . Heart disease Father     Review of Systems:  As stated in the HPI and otherwise negative.   BP 122/76   Pulse 64   Ht 5\' 9"  (1.753 m)   Wt 205 lb 6.4 oz (93.2 kg)   SpO2 97%   BMI 30.33 kg/m   Physical Examination: General: Well developed, well nourished, NAD  HEENT: OP clear, mucus membranes moist  SKIN: warm, dry. No rashes. Neuro: No focal deficits  Musculoskeletal: Muscle strength 5/5 all ext  Psychiatric: Mood and affect normal  Neck: No JVD, no carotid bruits, no thyromegaly, no lymphadenopathy.  Lungs:Clear bilaterally, no wheezes, rhonci, crackles Cardiovascular: Regular rate and rhythm. No murmurs, gallops or rubs. Abdomen:Soft. Bowel sounds present. Non-tender.  Extremities: No lower extremity edema. Pulses are 2 + in the bilateral DP/PT.  Echo 07/06/18: Left ventricle: The cavity size was normal. Wall thickness was   normal. Systolic function was normal. The estimated ejection   fraction was in the range of 60% to 65%. Wall motion was normal;   there were no regional wall motion abnormalities.  Left   ventricular diastolic function parameters were normal for the   patient&'s age. - Left atrium: The atrium was mildly dilated. - Right atrium: The atrium was mildly dilated.  Cardiac cath 11/24/13: Angiographic Findings: Left main: Diffuse proximal and mid 20-30% stenosis.  Left Anterior Descending Artery: Large caliber vessel that courses to the apex. The vessel is heavily calcified in the proximal and mid segments. The  mid vessel has a 50% heavily calcified stenosis. The distal vessel has diffuse plaque. The first diagonal branch is very small in caliber with ostial 50% stenosis. The second diagonal branch is moderate in caliber with mild plaque.  Circumflex Artery: Large vessel with heavy calcification in the proximal segment. There is a 99% proximal stenosis just beyond the ostium. The first OM branch is patent with mild plaque. The continuation of the AV groove Circumflex has a 40% stenosis.  Right Coronary Artery: Large dominant vessel with diffuse 20% stenosis, moderate proximal and mid calcification.  Left Ventricular Angiogram: LVEF=55-60%.   EKG:  EKG is not ordered today. The ekg ordered today demonstrates   Recent Labs: 06/29/2018: ALT 35; Magnesium 2.0; TSH 0.72 08/02/2018: BUN 10; Creatinine, Ser 0.98; Hemoglobin 17.3; Platelets 263; Potassium 3.8; Sodium 140   Lipid Panel    Component Value Date/Time   CHOL 163 06/29/2018 1534   TRIG 77 06/29/2018 1534   HDL 60 06/29/2018 1534   CHOLHDL 2.7 06/29/2018 1534   VLDL 19 01/15/2017 0932   LDLCALC 86 06/29/2018 1534     Wt Readings from Last 3 Encounters:  09/02/18 205 lb 6.4 oz (93.2 kg)  08/26/18 203 lb (92.1 kg)  08/09/18 206 lb 12.8 oz (93.8 kg)     Other studies Reviewed: Additional studies/ records that were reviewed today include:  Review of the above records demonstrates:    Assessment and Plan:   1. CAD without angina: He is known to have CAD with prior stenting of the Circumflex and moderate  calcified LAD stenosis with FFR of 0.87 in May 2015. Nuclear stress test in 2017 without ischemia. No chest pain. Will continue statin and beta blocker. He is not on ASA since he is also on Xarelto.     2. Atrial fibrillation, paroxysmal: Sinus today. Continue beta blocker, calcium channel blocker and Xarelto. He is followed by Dr. [name].   3. HTN: BP is well controlled. No changes  4. Hyperlipidemia: LDL 86 in December 2019. He wishes to try and lose weight and change his diet with repeat labs next month. If his LDL is not at goal of 70, would recommend increasing Crestor to 40 mg daily.   Current medicines are reviewed at length with the patient today.  The patient does not have concerns regarding medicines.  The following changes have been made:  no change  Labs/ tests ordered today include:   No orders of the defined types were placed in this encounter.    Disposition:   FU with me in 12  months  Signed, Lauree Chandler, MD 09/02/2018 8:43 AM    [facility] [address], [place], [place]  [address] Phone: [phone]; Fax: [phone]

## 2018-09-08 ENCOUNTER — Other Ambulatory Visit: Payer: Self-pay

## 2018-09-08 DIAGNOSIS — Z86010 Personal history of colonic polyps: Secondary | ICD-10-CM

## 2018-09-08 DIAGNOSIS — Z1211 Encounter for screening for malignant neoplasm of colon: Secondary | ICD-10-CM | POA: Diagnosis not present

## 2018-09-08 DIAGNOSIS — Z1212 Encounter for screening for malignant neoplasm of rectum: Principal | ICD-10-CM

## 2018-09-08 LAB — POC HEMOCCULT BLD/STL (HOME/3-CARD/SCREEN)
Card #2 Fecal Occult Blod, POC: NEGATIVE
Card #3 Fecal Occult Blood, POC: NEGATIVE
Fecal Occult Blood, POC: NEGATIVE

## 2018-09-21 ENCOUNTER — Other Ambulatory Visit: Payer: Self-pay | Admitting: Internal Medicine

## 2018-09-23 ENCOUNTER — Ambulatory Visit (HOSPITAL_COMMUNITY)
Admission: RE | Admit: 2018-09-23 | Discharge: 2018-09-23 | Disposition: A | Discharge status: Home / Self Care | Payer: Medicare Other | Source: Ambulatory Visit | Attending: Nurse Practitioner | Admitting: Nurse Practitioner

## 2018-09-23 ENCOUNTER — Encounter (HOSPITAL_COMMUNITY): Payer: Self-pay | Admitting: Nurse Practitioner

## 2018-09-23 VITALS — BP 118/62 | HR 65 | Ht 69.0 in | Wt 207.4 lb

## 2018-09-23 DIAGNOSIS — Z8249 Family history of ischemic heart disease and other diseases of the circulatory system: Secondary | ICD-10-CM | POA: Insufficient documentation

## 2018-09-23 DIAGNOSIS — I10 Essential (primary) hypertension: Secondary | ICD-10-CM | POA: Insufficient documentation

## 2018-09-23 DIAGNOSIS — H9193 Unspecified hearing loss, bilateral: Secondary | ICD-10-CM | POA: Diagnosis not present

## 2018-09-23 DIAGNOSIS — I480 Paroxysmal atrial fibrillation: Secondary | ICD-10-CM | POA: Insufficient documentation

## 2018-09-23 DIAGNOSIS — Z87891 Personal history of nicotine dependence: Secondary | ICD-10-CM | POA: Diagnosis not present

## 2018-09-23 DIAGNOSIS — I4819 Other persistent atrial fibrillation: Secondary | ICD-10-CM | POA: Diagnosis not present

## 2018-09-23 DIAGNOSIS — Z79899 Other long term (current) drug therapy: Secondary | ICD-10-CM | POA: Insufficient documentation

## 2018-09-23 DIAGNOSIS — G4733 Obstructive sleep apnea (adult) (pediatric): Secondary | ICD-10-CM | POA: Diagnosis not present

## 2018-09-23 DIAGNOSIS — I739 Peripheral vascular disease, unspecified: Secondary | ICD-10-CM | POA: Insufficient documentation

## 2018-09-23 DIAGNOSIS — R7303 Prediabetes: Secondary | ICD-10-CM | POA: Insufficient documentation

## 2018-09-23 DIAGNOSIS — E785 Hyperlipidemia, unspecified: Secondary | ICD-10-CM | POA: Diagnosis not present

## 2018-09-23 NOTE — Progress Notes (Signed)
Primary Care Physician: [name], MD Referring Physician: [name] is a 73 y.o. male with a h/o  afib s/p ablation x 3, 2014, 2016, 08/25/2018 and h/o prior multiple cardioversion's. He is being seen in the afib clinic for f/u his 3rd ablation. He reports no afib since the procedure. No swallowing or groin  issues.  Today, he denies symptoms of palpitations, chest pain, shortness of breath, orthopnea, PND, lower extremity edema, dizziness, presyncope, syncope, or neurologic sequela. The patient is tolerating medications without difficulties and is otherwise without complaint today.   Past Medical History:  Diagnosis Date  . Bladder cancer ([place])   . Hearing loss of both ears   . History of colon polyps    BENIGN  . Hyperlipidemia   . Hypertension   . Mitral regurgitation   . OSA on CPAP    cpap settign of 13  . PAD (peripheral artery disease) ([facility])    ABI--  RIGHT SIDE NORMAL LEFT SIDE MODERATELY REDUCED W/ DISTAL LEFT SFA STENOSIS//  MILD CALDICATION  . PAF (paroxysmal atrial fibrillation) ([place])     a. s/p PVI 2014; b. s/p redo PVI and CTI 01/2015  . Pre-diabetes   . Vitamin D deficiency    Past Surgical History:  Procedure Laterality Date  . ABLATION OF DYSRHYTHMIC FOCUS  01/26/2015  . ATRIAL FIBRILLATION ABLATION N/A 07/27/2012   Procedure: ATRIAL FIBRILLATION ABLATION;  Surgeon: [name], MD;  Location: [facility] CATH LAB;  Service: Cardiovascular;  Laterality: N/A;  . ATRIAL FIBRILLATION ABLATION N/A 08/25/2018   Procedure: ATRIAL FIBRILLATION ABLATION;  Surgeon: [name], MD;  Location: [place] CV LAB;  Service: Cardiovascular;  Laterality: N/A;  . CARDIAC ELECTROPHYSIOLOGY STUDY AND ABLATION  07-27-2012  DR ALLRED   SUCCESSFUL ABLATION OF A-FIB  . CARDIOVERSION  08/06/2012   Procedure: CARDIOVERSION;  Surgeon: [name], MD;  Location: [facility] ENDOSCOPY;  Service: Cardiovascular;  Laterality: N/A;  . CARDIOVERSION N/A 08/25/2014   Procedure:  CARDIOVERSION;  Surgeon: Larey Dresser, MD;  Location: [place];  Service: Cardiovascular;  Laterality: N/A;  . CARDIOVERSION N/A 11/13/2014   Procedure: CARDIOVERSION;  Surgeon: Sueanne Margarita, MD;  Location: [facility] ENDOSCOPY;  Service: Cardiovascular;  Laterality: N/A;  . CARDIOVERSION N/A 12/28/2014   Procedure: CARDIOVERSION;  Surgeon: [name], MD;  Location: [place];  Service: Cardiovascular;  Laterality: N/A;  . CARDIOVERSION N/A 01/30/2017   Procedure: CARDIOVERSION;  Surgeon: Josue Hector, MD;  Location: [facility] ENDOSCOPY;  Service: Cardiovascular;  Laterality: N/A;  . CARDIOVERSION N/A 04/06/2017   Procedure: CARDIOVERSION;  Surgeon: Dorothy Spark, MD;  Location: [facility] ENDOSCOPY;  Service: Cardiovascular;  Laterality: N/A;  . CARDIOVERSION N/A 05/25/2018   Procedure: CARDIOVERSION;  Surgeon: Skeet Latch, MD;  Location: [facility] ENDOSCOPY;  Service: Cardiovascular;  Laterality: N/A;  . CARDIOVERSION N/A 08/09/2018   Procedure: CARDIOVERSION;  Surgeon: Buford Dresser, MD;  Location: [facility] ENDOSCOPY;  Service: Cardiovascular;  Laterality: N/A;  . CATARACT EXTRACTION W/ INTRAOCULAR LENS  IMPLANT, BILATERAL    . COLONOSCOPY WITH PROPOFOL N/A 02/13/2015   Procedure: COLONOSCOPY WITH PROPOFOL;  Surgeon: Garlan Fair, MD;  Location: [facility] ENDOSCOPY;  Service: Endoscopy;  Laterality: N/A;  . CYSTOSCOPY W/ RETROGRADES Bilateral 04/12/2013   Procedure: CYSTOSCOPY WITH RETROGRADE PYELOGRAM;  Surgeon: [name], MD;  Location: [facility];  Service: Urology;  Laterality: Bilateral;  . ELECTROPHYSIOLOGIC STUDY N/A 01/26/2015   Procedure: Atrial Fibrillation Ablation;  Surgeon: [name], MD;  Location:  [place] INVASIVE CV LAB;  Service: Cardiovascular;  Laterality: N/A;  . LEFT HEART CATHETERIZATION WITH CORONARY ANGIOGRAM N/A 11/24/2013   Procedure: LEFT HEART CATHETERIZATION WITH CORONARY ANGIOGRAM;  Surgeon: [name], MD;  Location: [facility] CATH LAB;  Service:  Cardiovascular;  Laterality: N/A;  . PERCUTANEOUS CORONARY ROTOBLATOR INTERVENTION (PCI-R) N/A 11/28/2013   Procedure: PERCUTANEOUS CORONARY ROTOBLATOR INTERVENTION (PCI-R);  Surgeon: [name], MD;  Location: [facility] CATH LAB;  Service: Cardiovascular;  Laterality: N/A;  . PERCUTANEOUS CORONARY STENT INTERVENTION (PCI-S)  11/24/2013   Procedure: PERCUTANEOUS CORONARY STENT INTERVENTION (PCI-S);  Surgeon: [name], MD;  Location: [facility] CATH LAB;  Service: Cardiovascular;;  . TEE WITHOUT CARDIOVERSION  07/26/2012   Procedure: TRANSESOPHAGEAL ECHOCARDIOGRAM (TEE);  Surgeon: [name], MD;  Location: [facility] ENDOSCOPY;  Service: Cardiovascular;  Laterality: N/A;  . TEE WITHOUT CARDIOVERSION N/A 08/25/2014   Procedure: TRANSESOPHAGEAL ECHOCARDIOGRAM (TEE);  Surgeon: Larey Dresser, MD;  Location: [place];  Service: Cardiovascular;  Laterality: N/A;  . TEE WITHOUT CARDIOVERSION  01/26/2015   Procedure: Transesophageal Echocardiogram (Tee);  Surgeon: [name], MD;  Location: [place] CV LAB;  Service: Cardiovascular;;  . TONSILLECTOMY  age 67  . TOTAL HIP ARTHROPLASTY  07/25/2011   Procedure: TOTAL HIP ARTHROPLASTY ANTERIOR APPROACH;  Surgeon: Mcarthur Rossetti;  Location: [facility];  Service: Orthopedics;  Laterality: Right;  . TOTAL HIP ARTHROPLASTY Left 02-08-2010  . TRANSURETHRAL RESECTION OF BLADDER TUMOR WITH GYRUS (TURBT-GYRUS) N/A 04/12/2013   Procedure: TRANSURETHRAL RESECTION OF BLADDER TUMOR WITH GYRUS (TURBT-GYRUS);  Surgeon: [name], MD;  Location: [facility];  Service: Urology;  Laterality: N/A;    Current Outpatient Medications  Medication Sig Dispense Refill  . ALPRAZolam (XANAX) 1 MG tablet Take 1/2-1 tablet at Bedtime ONLY if needed  &  limit to 5 days /week to avoid addiction 30 tablet 0  . bisoprolol-hydrochlorothiazide (ZIAC) 10-6.25 MG tablet TAKE 1 TABLET BY MOUTH ONCE DAILY 90 tablet 1  . Cholecalciferol (VITAMIN D3) 5000 units  CAPS Take 10,000 Units by mouth daily.    Marland Kitchen diltiazem (CARDIZEM CD) 180 MG 24 hr capsule Take 1 capsule (180 mg total) by mouth daily. 30 capsule 3  . Multiple Vitamin (MULITIVITAMIN WITH MINERALS) TABS Take 1 tablet by mouth daily. FOR MEN    . NITROSTAT 0.4 MG SL tablet DISSOLVE ONE TABLET UNDER THE TONGUE EVERY 5 MINUTES AS NEEDED FOR CHEST PAIN.  DO NOT EXCEED A TOTAL OF 3 DOSES IN 15 MINUTES 25 tablet 6  . pantoprazole (PROTONIX) 40 MG tablet Take 1 tablet (40 mg total) by mouth daily. 45 tablet 0  . Polyvinyl Alcohol-Povidone (REFRESH OP) Place 1 drop into both eyes daily as needed (dry eyes).    . rivaroxaban (XARELTO) 20 MG TABS tablet TAKE ONE TABLET BY MOUTH ONCE DAILY WITH SUPPER 90 tablet 0  . rosuvastatin (CRESTOR) 20 MG tablet Take 20 mg by mouth every evening.     No current facility-administered medications for this encounter.     Allergies  Allergen Reactions  . Zetia [Ezetimibe] Other (See Comments)    myalgias     Social History   Socioeconomic History  . Marital status: Married    Spouse name: Not on file  . Number of children: Not on file  . Years of education: Not on file  . Highest education level: Not on file  Occupational History  . Not on file  Social Needs  . Financial resource strain: Not on file  .  Food insecurity:    Worry: Not on file    Inability: Not on file  . Transportation needs:    Medical: Not on file    Non-medical: Not on file  Tobacco Use  . Smoking status: Former Smoker    Packs/day: 2.00    Years: 10.00    Pack years: 20.00    Types: Cigarettes    Last attempt to quit: 09/20/1981    Years since quitting: 37.0  . Smokeless tobacco: Never Used  Substance and Sexual Activity  . Alcohol use: No    Comment: pt states he has stopped drinking alcohol  . Drug use: No  . Sexual activity: Not on file  Lifestyle  . Physical activity:    Days per week: Not on file    Minutes per session: Not on file  . Stress: Not on file    Relationships  . Social connections:    Talks on phone: Not on file    Gets together: Not on file    Attends religious service: Not on file    Active member of club or organization: Not on file    Attends meetings of clubs or organizations: Not on file    Relationship status: Not on file  . Intimate partner violence:    Fear of current or ex partner: Not on file    Emotionally abused: Not on file    Physically abused: Not on file    Forced sexual activity: Not on file  Other Topics Concern  . Not on file  Social History Narrative  . Not on file    Family History  Problem Relation Age of Onset  . Heart attack Mother   . Heart disease Mother   . Heart failure Father   . Heart disease Father     ROS- All systems are reviewed and negative except as per the HPI above  Physical Exam: Vitals:   09/23/18 0950  BP: 118/62  Pulse: 65  Weight: 94.1 kg  Height: 5\' 9"  (1.753 m)   Wt Readings from Last 3 Encounters:  09/23/18 94.1 kg  09/02/18 93.2 kg  08/26/18 92.1 kg    Labs: Lab Results  Component Value Date   NA 140 08/02/2018   K 3.8 08/02/2018   CL 108 08/02/2018   CO2 25 08/02/2018   GLUCOSE 122 (H) 08/02/2018   BUN 10 08/02/2018   CREATININE 0.98 08/02/2018   CALCIUM 9.5 08/02/2018   MG 2.0 06/29/2018   Lab Results  Component Value Date   INR 1.1 (H) 11/16/2013   Lab Results  Component Value Date   CHOL 163 06/29/2018   HDL 60 06/29/2018   LDLCALC 86 06/29/2018   TRIG 77 06/29/2018     GEN- The patient is well appearing, alert and oriented x 3 today.   Head- normocephalic, atraumatic Eyes-  Sclera clear, conjunctiva pink Ears- hearing intact Oropharynx- clear Neck- supple, no JVP Lymph- no cervical lymphadenopathy Lungs- Clear to ausculation bilaterally, normal work of breathing Heart-  regular rate and rhythm, no murmurs, rubs or gallops, PMI not laterally displaced GI- soft, NT, ND, + BS Extremities- no clubbing, cyanosis, or edema MS- no  significant deformity or atrophy Skin- no rash or lesion Psych- euthymic mood, full affect Neuro- strength and sensation are intact  EKG- NSR at 65 bpm, pr int 162 bpm, qrs int 93ms, qtc 438 ms Epic records reviewed  Assessment and Plan: 1. Paroxysmal afib S/p 3rd ablation one month ago Maintaining  SR Continue xarelto, chadsvasc score of at least 2, reminded not to miss anticoagulation for the 3 month recovery period Continue cardizem 180 mg daily /bisoprolol  2. HTN Stable   F/u with Dr. [name] 5/11  Geroge Baseman. Vernice Bowker, [facility] [address] [place], [place] [address] [phone]

## 2018-10-04 NOTE — Progress Notes (Signed)
FOLLOW UP  Assessment and Plan:   Paroxysmal atrial fibrillation (HCC) Recently s/p 3rd ablation and doing well  Continue follow up cardio Continue xarelto  CAD Control blood pressure, cholesterol, glucose, increase exercise.  Followed by cardiology No recent chest pain/nitroglycerine use  Hypertension Well controlled with current medications  Monitor blood pressure at home; patient to call if consistently greater than 130/80 Continue DASH diet.   Reminder to go to the ER if any CP, SOB, nausea, dizziness, severe HA, changes vision/speech, left arm numbness and tingling and jaw pain.  Cholesterol Currently above goal of LDL <70; currently on crestor 20 mg daily, working on lifestyle Continue low cholesterol diet and exercise.  Check lipid panel.   Other abnormal glucose Recent A1Cs well controlled Continue diet and exercise.  Perform daily foot/skin check, notify office of any concerning changes.  Will check A1C per strong patient preference due to recent diet adjustment   Obesity with co morbidities Long discussion about weight loss, diet, and exercise Recommended diet heavy in fruits and veggies and low in animal meats, cheeses, and dairy products, appropriate calorie intake Discussed ideal weight for height and weight goal (190 lb) Patient will work on reducing portion sizes Will follow up in 3 months  Vitamin D Def Above goal at last visit; continue supplementation to maintain goal of 60-100 Check Vit D level  Insomnia - good sleep hygiene discussed, increase day time activity, try melatonin or benadryl  Reminded to limit benzo use to <5 days/ week which he is doing well with at this time    Continue diet and meds as discussed. Further disposition pending results of labs. Discussed med's effects and SE's.   Over 30 minutes of exam, counseling, chart review, and critical decision making was performed.   Future Appointments  Date Time Provider [place]   10/05/2018  8:45 AM Liane Comber, NP [facility] None  11/29/2018  9:15 AM [name], MD [facility]  01/06/2019  9:30 AM [name], MD [facility] None  04/05/2019  9:00 AM Liane Comber, NP [facility] None  08/04/2019  3:00 PM [name], MD [facility] None    ----------------------------------------------------------------------------------------------------------------------  HPI 73 y.o. male  presents for 3 month follow up on hypertension, cholesterol, glucose management, obesity and vitamin D deficiency.   Patient has ASCAD  S/p PCA/stenting followed by Rotator Ablation in 2015 (Dr Aundra Dubin).   Patient also has hx/o pAfib (on Xarelto) with RFA  x 2 in 2014/2016. He has had several episodes this past summer with successful conversion; he recently underwent his 3rd ablation on 08/25/2018 by Dr. [name]. He continues on xarelto and cardizem 180 mg daily.   He has a diagnosis of insomnia and is currently prescribed xanax, currently taking 1/4-1/2 tab 3 days a week if feeling "wired" and having difficulty sleeping.   BMI is Body mass index is 30.27 kg/m., he has been working on diet and exercise, golfing 2-3 days a week and riding bike most days.  Wt Readings from Last 3 Encounters:  10/05/18 205 lb (93 kg)  09/23/18 207 lb 6.4 oz (94.1 kg)  09/02/18 205 lb 6.4 oz (93.2 kg)   His blood pressure has been controlled at home, today their BP is BP: 106/68  He does workout- walking [place] and golfing several times a week. He denies chest pain, shortness of breath, dizziness.   He is on cholesterol medication (currently taking crestor 20 mg daily) and denies myalgias. His cholesterol is at goal. The cholesterol  last visit was:   Lab Results  Component Value Date   CHOL 163 06/29/2018   HDL 60 06/29/2018   LDLCALC 86 06/29/2018   TRIG 77 06/29/2018   CHOLHDL 2.7 06/29/2018    He has not been working on diet and exercise for glucose management, and denies  increased appetite, nausea, paresthesia of the feet, polydipsia, polyuria, visual disturbances and vomiting. Last A1C in the office was:  Lab Results  Component Value Date   HGBA1C 5.3 06/29/2018   Patient is on Vitamin D supplement and at goal at recent check:   Lab Results  Component Value Date   VD25OH 108 (H) 06/29/2018        Current Medications:  Current Outpatient Medications on File Prior to Visit  Medication Sig  . ALPRAZolam (XANAX) 1 MG tablet Take 1/2-1 tablet at Bedtime ONLY if needed  &  limit to 5 days /week to avoid addiction  . bisoprolol-hydrochlorothiazide (ZIAC) 10-6.25 MG tablet TAKE 1 TABLET BY MOUTH ONCE DAILY  . Cholecalciferol (VITAMIN D3) 5000 units CAPS Take 10,000 Units by mouth daily.  Marland Kitchen diltiazem (CARDIZEM CD) 180 MG 24 hr capsule Take 1 capsule (180 mg total) by mouth daily.  . Multiple Vitamin (MULITIVITAMIN WITH MINERALS) TABS Take 1 tablet by mouth daily. FOR MEN  . NITROSTAT 0.4 MG SL tablet DISSOLVE ONE TABLET UNDER THE TONGUE EVERY 5 MINUTES AS NEEDED FOR CHEST PAIN.  DO NOT EXCEED A TOTAL OF 3 DOSES IN 15 MINUTES  . pantoprazole (PROTONIX) 40 MG tablet Take 1 tablet (40 mg total) by mouth daily.  . Polyvinyl Alcohol-Povidone (REFRESH OP) Place 1 drop into both eyes daily as needed (dry eyes).  . rivaroxaban (XARELTO) 20 MG TABS tablet TAKE ONE TABLET BY MOUTH ONCE DAILY WITH SUPPER  . rosuvastatin (CRESTOR) 20 MG tablet Take 20 mg by mouth every evening.   No current facility-administered medications on file prior to visit.      Allergies:  Allergies  Allergen Reactions  . Zetia [Ezetimibe] Other (See Comments)    myalgias      Medical History:  Past Medical History:  Diagnosis Date  . Bladder cancer ([place])   . Hearing loss of both ears   . History of colon polyps    BENIGN  . Hyperlipidemia   . Hypertension   . Mitral regurgitation   . OSA on CPAP    cpap settign of 13  . PAD (peripheral artery disease) (HCC)    ABI--  RIGHT  SIDE NORMAL LEFT SIDE MODERATELY REDUCED W/ DISTAL LEFT SFA STENOSIS//  MILD CALDICATION  . PAF (paroxysmal atrial fibrillation) ([place])     a. s/p PVI 2014; b. s/p redo PVI and CTI 01/2015  . Pre-diabetes   . Vitamin D deficiency    Family history- Reviewed and unchanged Social history- Reviewed and unchanged   Review of Systems:  Review of Systems  Constitutional: Negative for malaise/fatigue and weight loss.  HENT: Negative for hearing loss and tinnitus.   Eyes: Negative for blurred vision and double vision.  Respiratory: Negative for cough, shortness of breath and wheezing.   Cardiovascular: Negative for chest pain, palpitations, orthopnea, claudication and leg swelling.  Gastrointestinal: Negative for abdominal pain, blood in stool, constipation, diarrhea, heartburn, melena, nausea and vomiting.  Genitourinary: Negative.   Musculoskeletal: Negative for joint pain and myalgias.  Skin: Negative for rash.  Neurological: Negative for dizziness, tingling, sensory change, weakness and headaches.  Endo/Heme/Allergies: Negative for polydipsia.  Psychiatric/Behavioral: Negative.  All other systems reviewed and are negative.    Physical Exam: BP 106/68   Pulse (!) 58   Temp 98.1 F (36.7 C)   Ht 5\' 9"  (1.753 m)   Wt 205 lb (93 kg)   SpO2 94%   BMI 30.27 kg/m  Wt Readings from Last 3 Encounters:  10/05/18 205 lb (93 kg)  09/23/18 207 lb 6.4 oz (94.1 kg)  09/02/18 205 lb 6.4 oz (93.2 kg)   General Appearance: Well nourished, in no apparent distress. Eyes: PERRLA, EOMs, conjunctiva no swelling or erythema Sinuses: No Frontal/maxillary tenderness ENT/Mouth: Ext aud canals clear, TMs without erythema, bulging. No erythema, swelling, or exudate on post pharynx.  Tonsils not swollen or erythematous. Hearing normal.  Neck: Supple, thyroid normal.  Respiratory: Respiratory effort normal, BS equal bilaterally without rales, rhonchi, wheezing or stridor.  Cardio: RRR with no MRGs.  Brisk peripheral pulses without edema.  Abdomen: Soft, + BS.  Non tender, no guarding, rebound, hernias, masses. Lymphatics: Non tender without lymphadenopathy.  Musculoskeletal: Full ROM, 5/5 strength, Normal gait Skin: Warm, dry without rashes, lesions, ecchymosis.  Neuro: Cranial nerves intact. No cerebellar symptoms.  Psych: Awake and oriented X 3, normal affect, Insight and Judgment appropriate.    Izora Ribas, NP 8:44 AM [facility]

## 2018-10-05 ENCOUNTER — Other Ambulatory Visit: Payer: Self-pay

## 2018-10-05 ENCOUNTER — Ambulatory Visit: Payer: Medicare Other | Admitting: Adult Health

## 2018-10-05 ENCOUNTER — Encounter: Payer: Self-pay | Admitting: Adult Health

## 2018-10-05 VITALS — BP 106/68 | HR 58 | Temp 98.1°F | Ht 69.0 in | Wt 205.0 lb

## 2018-10-05 DIAGNOSIS — E559 Vitamin D deficiency, unspecified: Secondary | ICD-10-CM | POA: Diagnosis not present

## 2018-10-05 DIAGNOSIS — I480 Paroxysmal atrial fibrillation: Secondary | ICD-10-CM | POA: Diagnosis not present

## 2018-10-05 DIAGNOSIS — I10 Essential (primary) hypertension: Secondary | ICD-10-CM

## 2018-10-05 DIAGNOSIS — R7309 Other abnormal glucose: Secondary | ICD-10-CM

## 2018-10-05 DIAGNOSIS — K219 Gastro-esophageal reflux disease without esophagitis: Secondary | ICD-10-CM | POA: Diagnosis not present

## 2018-10-05 DIAGNOSIS — G4700 Insomnia, unspecified: Secondary | ICD-10-CM

## 2018-10-05 DIAGNOSIS — E782 Mixed hyperlipidemia: Secondary | ICD-10-CM

## 2018-10-05 DIAGNOSIS — E669 Obesity, unspecified: Secondary | ICD-10-CM

## 2018-10-05 NOTE — Patient Instructions (Addendum)
Goals    . Blood Pressure < 130/80    . LDL CALC < 70    . Weight (lb) < 190 lb (86.2 kg)       Aim for 7+ servings of fruits and vegetables daily  65-80+ fluid ounces of water or unsweet tea for healthy kidneys  Limit to max 1 drink of alcohol per day; avoid smoking/tobacco  Limit animal fats in diet for cholesterol and heart health - choose grass fed whenever available  Avoid highly processed foods, and foods high in saturated/trans fats  Aim for low stress - take time to unwind and care for your mental health  Aim for 150 min of moderate intensity exercise weekly for heart health, and weights twice weekly for bone health  Aim for 7-9 hours of sleep daily        When it comes to diets, agreement about the perfect plan isn't easy to find, even among the experts. Experts at the [employer] developed an idea known as the Healthy Eating Plate. Just imagine a plate divided into logical, healthy portions.  The emphasis is on diet quality:  Load up on vegetables and fruits - one-half of your plate: Aim for color and variety, and remember that potatoes don't count.  Go for whole grains - one-quarter of your plate: Whole wheat, barley, wheat berries, quinoa, oats, brown rice, and foods made with them. If you want pasta, go with whole wheat pasta.  Protein power - one-quarter of your plate: Fish, chicken, beans, and nuts are all healthy, versatile protein sources. Limit red meat.  The diet, however, does go beyond the plate, offering a few other suggestions.  Use healthy plant oils, such as olive, canola, soy, corn, sunflower and peanut. Check the labels, and avoid partially hydrogenated oil, which have unhealthy trans fats.  If you're thirsty, drink water. Coffee and tea are good in moderation, but skip sugary drinks and limit milk and dairy products to one or two daily servings.  The type of carbohydrate in the diet is more important than the amount. Some  sources of carbohydrates, such as vegetables, fruits, whole grains, and beans-are healthier than others.  Finally, stay active.

## 2018-10-06 ENCOUNTER — Other Ambulatory Visit: Payer: Self-pay | Admitting: Adult Health

## 2018-10-06 LAB — CBC WITH DIFFERENTIAL/PLATELET
Absolute Monocytes: 660 cells/uL (ref 200–950)
Basophils Absolute: 27 cells/uL (ref 0–200)
Basophils Relative: 0.4 %
Eosinophils Absolute: 129 cells/uL (ref 15–500)
Eosinophils Relative: 1.9 %
HCT: 45.5 % (ref 38.5–50.0)
HEMOGLOBIN: 15.6 g/dL (ref 13.2–17.1)
Lymphs Abs: 993 cells/uL (ref 850–3900)
MCH: 30.4 pg (ref 27.0–33.0)
MCHC: 34.3 g/dL (ref 32.0–36.0)
MCV: 88.5 fL (ref 80.0–100.0)
MPV: 9.6 fL (ref 7.5–12.5)
Monocytes Relative: 9.7 %
Neutro Abs: 4991 cells/uL (ref 1500–7800)
Neutrophils Relative %: 73.4 %
Platelets: 205 10*3/uL (ref 140–400)
RBC: 5.14 10*6/uL (ref 4.20–5.80)
RDW: 12.5 % (ref 11.0–15.0)
Total Lymphocyte: 14.6 %
WBC: 6.8 10*3/uL (ref 3.8–10.8)

## 2018-10-06 LAB — COMPLETE METABOLIC PANEL WITH GFR
AG Ratio: 2 (calc) (ref 1.0–2.5)
ALT: 23 U/L (ref 9–46)
AST: 19 U/L (ref 10–35)
Albumin: 4.4 g/dL (ref 3.6–5.1)
Alkaline phosphatase (APISO): 61 U/L (ref 35–144)
BUN: 14 mg/dL (ref 7–25)
CO2: 26 mmol/L (ref 20–32)
Calcium: 9.3 mg/dL (ref 8.6–10.3)
Chloride: 105 mmol/L (ref 98–110)
Creat: 0.93 mg/dL (ref 0.70–1.18)
GFR, Est African American: 94 mL/min/{1.73_m2} (ref >=60)
GFR, Est Non African American: 81 mL/min/{1.73_m2} (ref >=60)
GLUCOSE: 99 mg/dL (ref 65–99)
Globulin: 2.2 g/dL (calc) (ref 1.9–3.7)
Potassium: 3.9 mmol/L (ref 3.5–5.3)
Sodium: 140 mmol/L (ref 135–146)
Total Bilirubin: 1.9 mg/dL — ABNORMAL HIGH (ref 0.2–1.2)
Total Protein: 6.6 g/dL (ref 6.1–8.1)

## 2018-10-06 LAB — VITAMIN D 25 HYDROXY (VIT D DEFICIENCY, FRACTURES): Vit D, 25-Hydroxy: 93 ng/mL (ref 30–100)

## 2018-10-06 LAB — LIPID PANEL
Cholesterol: 175 mg/dL (ref <200)
HDL: 58 mg/dL (ref >=40)
LDL Cholesterol (Calc): 96 mg/dL (calc)
Non-HDL Cholesterol (Calc): 117 mg/dL (calc) (ref <130)
TRIGLYCERIDES: 109 mg/dL (ref <150)
Total CHOL/HDL Ratio: 3 (calc) (ref <5.0)

## 2018-10-06 LAB — HEMOGLOBIN A1C
Hgb A1c MFr Bld: 5.4 % of total Hgb (ref <5.7)
Mean Plasma Glucose: 108 (calc)
eAG (mmol/L): 6 (calc)

## 2018-10-06 LAB — TSH: TSH: 0.8 mIU/L (ref 0.40–4.50)

## 2018-10-06 LAB — MAGNESIUM: Magnesium: 2.2 mg/dL (ref 1.5–2.5)

## 2018-10-06 MED ORDER — ROSUVASTATIN CALCIUM 40 MG PO TABS: 40.0000 mg | ORAL_TABLET | Freq: Every day | ORAL | 1 refills | Status: DC

## 2018-10-19 ENCOUNTER — Other Ambulatory Visit: Payer: Self-pay | Admitting: Adult Health

## 2018-11-17 ENCOUNTER — Telehealth: Payer: Self-pay

## 2018-11-26 NOTE — Telephone Encounter (Signed)
Left message regarding appt on 11/29/18. 

## 2018-11-29 ENCOUNTER — Telehealth: Payer: Self-pay

## 2018-11-29 ENCOUNTER — Telehealth (INDEPENDENT_AMBULATORY_CARE_PROVIDER_SITE_OTHER): Payer: Medicare Other | Admitting: Internal Medicine

## 2018-11-29 DIAGNOSIS — I4819 Other persistent atrial fibrillation: Secondary | ICD-10-CM | POA: Diagnosis not present

## 2018-11-29 DIAGNOSIS — G4733 Obstructive sleep apnea (adult) (pediatric): Secondary | ICD-10-CM | POA: Diagnosis not present

## 2018-11-29 DIAGNOSIS — I2510 Atherosclerotic heart disease of native coronary artery without angina pectoris: Secondary | ICD-10-CM | POA: Diagnosis not present

## 2018-11-29 DIAGNOSIS — I10 Essential (primary) hypertension: Secondary | ICD-10-CM | POA: Diagnosis not present

## 2018-11-29 NOTE — Telephone Encounter (Signed)
Follow up   Patient is having trouble getting onto video visit. Please call.

## 2018-11-29 NOTE — Progress Notes (Signed)
Electrophysiology TeleHealth Note   Due to national recommendations of social distancing due to COVID 19, an audio/video telehealth visit is felt to be most appropriate for this patient at this time.  See MyChart message from today for the patient's consent to telehealth for [facility].   Date:  11/29/2018   ID:  LATEEF JUNCAJ, DOB 14-Apr-1946, MRN [id number]  Location: patient's home  Provider location: [address], [place]  Evaluation Performed: Follow-up visit  PCP:  [name], MD  Cardiologist:  Lauree Chandler, MD  Electrophysiologist:  Dr [name]  Chief Complaint:  afib  History of Present Illness:    Damon [name] is a 73 y.o. male who presents via audio/video conferencing for a telehealth visit today.  Since his ablation, the patient reports doing very well.  His afib is quiescent.  No procedure related complications.  Today, he denies symptoms of palpitations, chest pain, shortness of breath,  lower extremity edema, dizziness, presyncope, or syncope.  The patient is otherwise without complaint today.  The patient denies symptoms of fevers, chills, cough, or new SOB worrisome for COVID 19.  Past Medical History:  Diagnosis Date  . Bladder cancer ([place])   . Hearing loss of both ears   . History of colon polyps    BENIGN  . Hyperlipidemia   . Hypertension   . Mitral regurgitation   . OSA on CPAP    cpap settign of 13  . PAD (peripheral artery disease) (HCC)    ABI--  RIGHT SIDE NORMAL LEFT SIDE MODERATELY REDUCED W/ DISTAL LEFT SFA STENOSIS//  MILD CALDICATION  . PAF (paroxysmal atrial fibrillation) ([place])     a. s/p PVI 2014; b. s/p redo PVI and CTI 01/2015  . Pre-diabetes   . Vitamin D deficiency     Past Surgical History:  Procedure Laterality Date  . ABLATION OF DYSRHYTHMIC FOCUS  01/26/2015  . ATRIAL FIBRILLATION ABLATION N/A 07/27/2012   Procedure: ATRIAL FIBRILLATION ABLATION;  Surgeon: [name], MD;  Location: [facility] CATH  LAB;  Service: Cardiovascular;  Laterality: N/A;  . ATRIAL FIBRILLATION ABLATION N/A 08/25/2018   Procedure: ATRIAL FIBRILLATION ABLATION;  Surgeon: [name], MD;  Location: [place] CV LAB;  Service: Cardiovascular;  Laterality: N/A;  . CARDIAC ELECTROPHYSIOLOGY STUDY AND ABLATION  07-27-2012  DR Josemiguel Gries   SUCCESSFUL ABLATION OF A-FIB  . CARDIOVERSION  08/06/2012   Procedure: CARDIOVERSION;  Surgeon: [name], MD;  Location: [facility] ENDOSCOPY;  Service: Cardiovascular;  Laterality: N/A;  . CARDIOVERSION N/A 08/25/2014   Procedure: CARDIOVERSION;  Surgeon: Larey Dresser, MD;  Location: [place];  Service: Cardiovascular;  Laterality: N/A;  . CARDIOVERSION N/A 11/13/2014   Procedure: CARDIOVERSION;  Surgeon: Sueanne Margarita, MD;  Location: [facility] ENDOSCOPY;  Service: Cardiovascular;  Laterality: N/A;  . CARDIOVERSION N/A 12/28/2014   Procedure: CARDIOVERSION;  Surgeon: [name], MD;  Location: [place];  Service: Cardiovascular;  Laterality: N/A;  . CARDIOVERSION N/A 01/30/2017   Procedure: CARDIOVERSION;  Surgeon: Josue Hector, MD;  Location: [facility] ENDOSCOPY;  Service: Cardiovascular;  Laterality: N/A;  . CARDIOVERSION N/A 04/06/2017   Procedure: CARDIOVERSION;  Surgeon: Dorothy Spark, MD;  Location: [facility] ENDOSCOPY;  Service: Cardiovascular;  Laterality: N/A;  . CARDIOVERSION N/A 05/25/2018   Procedure: CARDIOVERSION;  Surgeon: Skeet Latch, MD;  Location: [facility] ENDOSCOPY;  Service: Cardiovascular;  Laterality: N/A;  . CARDIOVERSION N/A 08/09/2018   Procedure: CARDIOVERSION;  Surgeon: Buford Dresser, MD;  Location: [facility] ENDOSCOPY;  Service: Cardiovascular;  Laterality: N/A;  . CATARACT EXTRACTION W/ INTRAOCULAR LENS  IMPLANT, BILATERAL    . COLONOSCOPY WITH PROPOFOL N/A 02/13/2015   Procedure: COLONOSCOPY WITH PROPOFOL;  Surgeon: Garlan Fair, MD;  Location: [facility] ENDOSCOPY;  Service: Endoscopy;  Laterality: N/A;  . CYSTOSCOPY W/ RETROGRADES Bilateral 04/12/2013   Procedure:  CYSTOSCOPY WITH RETROGRADE PYELOGRAM;  Surgeon: [name], MD;  Location: [facility];  Service: Urology;  Laterality: Bilateral;  . ELECTROPHYSIOLOGIC STUDY N/A 01/26/2015   Procedure: Atrial Fibrillation Ablation;  Surgeon: [name], MD;  Location: [place] CV LAB;  Service: Cardiovascular;  Laterality: N/A;  . LEFT HEART CATHETERIZATION WITH CORONARY ANGIOGRAM N/A 11/24/2013   Procedure: LEFT HEART CATHETERIZATION WITH CORONARY ANGIOGRAM;  Surgeon: [name], MD;  Location: [facility] CATH LAB;  Service: Cardiovascular;  Laterality: N/A;  . PERCUTANEOUS CORONARY ROTOBLATOR INTERVENTION (PCI-R) N/A 11/28/2013   Procedure: PERCUTANEOUS CORONARY ROTOBLATOR INTERVENTION (PCI-R);  Surgeon: [name], MD;  Location: [facility] CATH LAB;  Service: Cardiovascular;  Laterality: N/A;  . PERCUTANEOUS CORONARY STENT INTERVENTION (PCI-S)  11/24/2013   Procedure: PERCUTANEOUS CORONARY STENT INTERVENTION (PCI-S);  Surgeon: [name], MD;  Location: [facility] CATH LAB;  Service: Cardiovascular;;  . TEE WITHOUT CARDIOVERSION  07/26/2012   Procedure: TRANSESOPHAGEAL ECHOCARDIOGRAM (TEE);  Surgeon: [name], MD;  Location: [facility] ENDOSCOPY;  Service: Cardiovascular;  Laterality: N/A;  . TEE WITHOUT CARDIOVERSION N/A 08/25/2014   Procedure: TRANSESOPHAGEAL ECHOCARDIOGRAM (TEE);  Surgeon: Larey Dresser, MD;  Location: [place];  Service: Cardiovascular;  Laterality: N/A;  . TEE WITHOUT CARDIOVERSION  01/26/2015   Procedure: Transesophageal Echocardiogram (Tee);  Surgeon: [name], MD;  Location: [place] CV LAB;  Service: Cardiovascular;;  . TONSILLECTOMY  age 88  . TOTAL HIP ARTHROPLASTY  07/25/2011   Procedure: TOTAL HIP ARTHROPLASTY ANTERIOR APPROACH;  Surgeon: Mcarthur Rossetti;  Location: [facility] ORS;  Service: Orthopedics;  Laterality: Right;  . TOTAL HIP ARTHROPLASTY Left 02-08-2010  . TRANSURETHRAL RESECTION OF BLADDER TUMOR WITH GYRUS (TURBT-GYRUS) N/A 04/12/2013    Procedure: TRANSURETHRAL RESECTION OF BLADDER TUMOR WITH GYRUS (TURBT-GYRUS);  Surgeon: [name], MD;  Location: [facility];  Service: Urology;  Laterality: N/A;    Current Outpatient Medications  Medication Sig Dispense Refill  . ALPRAZolam (XANAX) 1 MG tablet Take 1/2-1 tablet at Bedtime ONLY if needed  &  limit to 5 days /week to avoid addiction 30 tablet 0  . bisoprolol-hydrochlorothiazide (ZIAC) 10-6.25 MG tablet Take 1 tablet by mouth once daily 90 tablet 0  . Cholecalciferol (VITAMIN D3) 5000 units CAPS Take 10,000 Units by mouth daily.    Marland Kitchen diltiazem (CARDIZEM CD) 180 MG 24 hr capsule Take 1 capsule (180 mg total) by mouth daily. 30 capsule 3  . Multiple Vitamin (MULITIVITAMIN WITH MINERALS) TABS Take 1 tablet by mouth daily. FOR MEN    . NITROSTAT 0.4 MG SL tablet DISSOLVE ONE TABLET UNDER THE TONGUE EVERY 5 MINUTES AS NEEDED FOR CHEST PAIN.  DO NOT EXCEED A TOTAL OF 3 DOSES IN 15 MINUTES 25 tablet 6  . pantoprazole (PROTONIX) 40 MG tablet Take 1 tablet (40 mg total) by mouth daily. 45 tablet 0  . Polyvinyl Alcohol-Povidone (REFRESH OP) Place 1 drop into both eyes daily as needed (dry eyes).    . rivaroxaban (XARELTO) 20 MG TABS tablet TAKE ONE TABLET BY MOUTH ONCE DAILY WITH SUPPER 90 tablet 0  . rosuvastatin (CRESTOR) 40 MG tablet Take 1 tablet (40 mg total) by mouth  daily. 90 tablet 1   No current facility-administered medications for this visit.     Allergies:   Zetia [ezetimibe]   Social History:  The patient  reports that he quit smoking about 37 years ago. His smoking use included cigarettes. He has a 20.00 pack-year smoking history. He has never used smokeless tobacco. He reports that he does not drink alcohol or use drugs.   Family History:  The patient's  family history includes Heart attack in his mother; Heart disease in his father and mother; Heart failure in his father.   ROS:  Please see the history of present illness.   All other systems are  personally reviewed and negative.    Exam:    Vital Signs:  There were no vitals taken for this visit.  Well appearing, alert and conversant, regular work of breathing,  good skin color Eyes- anicteric, neuro- grossly intact, skin- no apparent rash or lesions or cyanosis, mouth- oral mucosa is pink   Labs/Other Tests and Data Reviewed:    Recent Labs: 10/05/2018: ALT 23; BUN 14; Creat 0.93; Hemoglobin 15.6; Magnesium 2.2; Platelets 205; Potassium 3.9; Sodium 140; TSH 0.80   Wt Readings from Last 3 Encounters:  10/05/18 205 lb (93 kg)  09/23/18 207 lb 6.4 oz (94.1 kg)  09/02/18 205 lb 6.4 oz (93.2 kg)     Other studies personally reviewed: Additional studies/ records that were reviewed today include: my prior office visits, procedure note  Review of the above records today demonstrates: as above Prior radiographs: cardiac CT 08/18/2018 is reviewed,  Calcium score is 95%   ASSESSMENT & PLAN:    1.  Persistent afib Well controlled post ablation No changes today On xarelto for chads2vasc score 3.  2. HTN Stable No change required today  3. OSA Compliant with CPAP  4. CAD Stable No change required today  5. COVID 19 screen The patient denies symptoms of COVID 19 at this time.  The importance of social distancing was discussed today.  Follow-up:  3 months with me  Current medicines are reviewed at length with the patient today.   The patient does not have concerns regarding his medicines.  The following changes were made today:  none  Labs/ tests ordered today include:  No orders of the defined types were placed in this encounter.   Patient Risk:  after full review of this patients clinical status, I feel that they are at moderate risk at this time.  Today, I have spent 15 minutes with the patient with telehealth technology discussing afib .    Army Fossa, MD  11/29/2018 10:24 AM     [facility] [address] [place]  [address] [phone] (office) [phone] (fax)

## 2018-11-29 NOTE — Telephone Encounter (Signed)
Spoke with pt regarding apppt on 11/29/18. Pt was advise to check vitals prior to appt. Pt questions and concerns were address.

## 2018-12-09 ENCOUNTER — Other Ambulatory Visit: Payer: Self-pay | Admitting: Internal Medicine

## 2018-12-15 ENCOUNTER — Other Ambulatory Visit: Payer: Self-pay | Admitting: Internal Medicine

## 2019-01-06 ENCOUNTER — Encounter: Payer: Self-pay | Admitting: Internal Medicine

## 2019-01-06 ENCOUNTER — Other Ambulatory Visit: Payer: Self-pay

## 2019-01-06 ENCOUNTER — Ambulatory Visit (INDEPENDENT_AMBULATORY_CARE_PROVIDER_SITE_OTHER): Payer: Medicare Other | Admitting: Internal Medicine

## 2019-01-06 VITALS — BP 138/82 | HR 60 | Temp 97.0°F | Resp 18 | Ht 69.0 in | Wt 203.6 lb

## 2019-01-06 DIAGNOSIS — Z79899 Other long term (current) drug therapy: Secondary | ICD-10-CM

## 2019-01-06 DIAGNOSIS — R7309 Other abnormal glucose: Secondary | ICD-10-CM | POA: Diagnosis not present

## 2019-01-06 DIAGNOSIS — Z20822 Contact with and (suspected) exposure to covid-19: Secondary | ICD-10-CM

## 2019-01-06 DIAGNOSIS — I10 Essential (primary) hypertension: Secondary | ICD-10-CM | POA: Diagnosis not present

## 2019-01-06 DIAGNOSIS — Z20828 Contact with and (suspected) exposure to other viral communicable diseases: Secondary | ICD-10-CM

## 2019-01-06 DIAGNOSIS — E782 Mixed hyperlipidemia: Secondary | ICD-10-CM | POA: Diagnosis not present

## 2019-01-06 DIAGNOSIS — I480 Paroxysmal atrial fibrillation: Secondary | ICD-10-CM

## 2019-01-06 DIAGNOSIS — E559 Vitamin D deficiency, unspecified: Secondary | ICD-10-CM | POA: Diagnosis not present

## 2019-01-06 DIAGNOSIS — K219 Gastro-esophageal reflux disease without esophagitis: Secondary | ICD-10-CM

## 2019-01-06 NOTE — Progress Notes (Signed)
History of Present Illness:      This very nice 73 y.o. MWM presents for 6 month follow up with HTN, ASCAD /pAfib,  HLD, Pre-Diabetes and Vitamin D Deficiency. Patient has hx/o OSA, but was intolerant to the masks.      Patient is treated for HTN (1996)  & BP has been controlled at home. Today's BP was initially elevated & rechecked at goal:138/82.  Patient had PCA/Stenting & Rotator Ablation in 2015. He presented with Afib in 2014 & had a CV & again a 2sd CV in 2016. In Nov 2019, he failed 3rd CV by Dr Oval Linsey and remains in Afib. He is on Xarelto for CHADsVasc 3.  Patient has had no complaints of any cardiac type chest pain, palpitations, dyspnea / orthopnea / PND, dizziness, claudication, or dependent edema.      Hyperlipidemia is controlled with diet & meds. Patient denies myalgias or other med SE's. Last Lipids were at goal: Lab Results  Component Value Date   CHOL 168 01/06/2019   HDL 53 01/06/2019   LDLCALC 96 01/06/2019   TRIG 96 01/06/2019   CHOLHDL 3.2 01/06/2019       Also, the patient has history of PreDiabetes  (A1c 5.7% / 2011 & 5.9% / 2013)  and has had no symptoms of reactive hypoglycemia, diabetic polys, paresthesias or visual blurring.  Last A1c was  Normal & at goal:  Lab Results  Component Value Date   HGBA1C 5.4 01/06/2019       Further, the patient also has history of Vitamin D Deficiency ("27" / 2008)  and supplements vitamin D without any suspected side-effects. Last vitamin D was at goal: Lab Results  Component Value Date   VD25OH 99 01/06/2019   Current Outpatient Medications on File Prior to Visit  Medication Sig  . ALPRAZolam (XANAX) 1 MG tablet TAKE 1/2 (ONE-HALF) TO 1 TABLET BY MOUTH AT BEDTIME ONLY IF NEEDED AND LIMIT TO 5 DAYS/WEEK TO AVOID ADDICTION  . NITROSTAT 0.4 MG SL tablet DISSOLVE ONE TABLET UNDER THE TONGUE EVERY 5 MINUTES AS NEEDED FOR CHEST PAIN.  DO NOT EXCEED A TOTAL OF 3 DOSES IN 15 MINUTES  . Polyvinyl Alcohol-Povidone (REFRESH OP)  Place 1 drop into both eyes daily as needed (dry eyes).  . rosuvastatin (CRESTOR) 40 MG tablet Take 1 tablet Daily for Cholesterol   No current facility-administered medications on file prior to visit.     Allergies  Allergen Reactions  . Zetia [Ezetimibe] Other (See Comments)    myalgias    PMHx:   Past Medical History:  Diagnosis Date  . Bladder cancer ([place])   . Hearing loss of both ears   . History of colon polyps    BENIGN  . Hyperlipidemia   . Hypertension   . Mitral regurgitation   . OSA on CPAP    cpap settign of 13  . PAD (peripheral artery disease) ([facility])    ABI--  RIGHT SIDE NORMAL LEFT SIDE MODERATELY REDUCED W/ DISTAL LEFT SFA STENOSIS//  MILD CALDICATION  . PAF (paroxysmal atrial fibrillation) ([place])     a. s/p PVI 2014; b. s/p redo PVI and CTI 01/2015  . Pre-diabetes   . Vitamin D deficiency    Immunization History  Administered Date(s) Administered  . DT 07/22/2003, 12/28/2013  . Influenza Whole 05/19/2013  . Influenza, High Dose Seasonal PF 04/08/2017  . Influenza,inj,Quad PF,6+ Mos 05/09/2014  . Influenza-Unspecified 05/14/2015, 05/01/2016, 05/17/2018  . Pneumococcal Conjugate-13 07/11/2014  . Pneumococcal  Polysaccharide-23 12/04/2009, 06/10/2016  . Tdap 09/22/2013   Past Surgical History:  Procedure Laterality Date  . ABLATION OF DYSRHYTHMIC FOCUS  01/26/2015  . ATRIAL FIBRILLATION ABLATION N/A 07/27/2012   Procedure: ATRIAL FIBRILLATION ABLATION;  Surgeon: [name], MD;  Location: [facility] CATH LAB;  Service: Cardiovascular;  Laterality: N/A;  . ATRIAL FIBRILLATION ABLATION N/A 08/25/2018   Procedure: ATRIAL FIBRILLATION ABLATION;  Surgeon: [name], MD;  Location: [place] CV LAB;  Service: Cardiovascular;  Laterality: N/A;  . CARDIAC ELECTROPHYSIOLOGY STUDY AND ABLATION  07-27-2012  DR ALLRED   SUCCESSFUL ABLATION OF A-FIB  . CARDIOVERSION  08/06/2012   Procedure: CARDIOVERSION;  Surgeon: [name], MD;  Location: [facility] ENDOSCOPY;  Service:  Cardiovascular;  Laterality: N/A;  . CARDIOVERSION N/A 08/25/2014   Procedure: CARDIOVERSION;  Surgeon: Larey Dresser, MD;  Location: [place];  Service: Cardiovascular;  Laterality: N/A;  . CARDIOVERSION N/A 11/13/2014   Procedure: CARDIOVERSION;  Surgeon: Sueanne Margarita, MD;  Location: [facility] ENDOSCOPY;  Service: Cardiovascular;  Laterality: N/A;  . CARDIOVERSION N/A 12/28/2014   Procedure: CARDIOVERSION;  Surgeon: [name], MD;  Location: [place];  Service: Cardiovascular;  Laterality: N/A;  . CARDIOVERSION N/A 01/30/2017   Procedure: CARDIOVERSION;  Surgeon: Josue Hector, MD;  Location: [facility] ENDOSCOPY;  Service: Cardiovascular;  Laterality: N/A;  . CARDIOVERSION N/A 04/06/2017   Procedure: CARDIOVERSION;  Surgeon: Dorothy Spark, MD;  Location: [facility] ENDOSCOPY;  Service: Cardiovascular;  Laterality: N/A;  . CARDIOVERSION N/A 05/25/2018   Procedure: CARDIOVERSION;  Surgeon: Skeet Latch, MD;  Location: [facility] ENDOSCOPY;  Service: Cardiovascular;  Laterality: N/A;  . CARDIOVERSION N/A 08/09/2018   Procedure: CARDIOVERSION;  Surgeon: Buford Dresser, MD;  Location: [facility] ENDOSCOPY;  Service: Cardiovascular;  Laterality: N/A;  . CATARACT EXTRACTION W/ INTRAOCULAR LENS  IMPLANT, BILATERAL    . COLONOSCOPY WITH PROPOFOL N/A 02/13/2015   Procedure: COLONOSCOPY WITH PROPOFOL;  Surgeon: Garlan Fair, MD;  Location: [facility] ENDOSCOPY;  Service: Endoscopy;  Laterality: N/A;  . CYSTOSCOPY W/ RETROGRADES Bilateral 04/12/2013   Procedure: CYSTOSCOPY WITH RETROGRADE PYELOGRAM;  Surgeon: [name], MD;  Location: [facility];  Service: Urology;  Laterality: Bilateral;  . ELECTROPHYSIOLOGIC STUDY N/A 01/26/2015   Procedure: Atrial Fibrillation Ablation;  Surgeon: [name], MD;  Location: [place] CV LAB;  Service: Cardiovascular;  Laterality: N/A;  . LEFT HEART CATHETERIZATION WITH CORONARY ANGIOGRAM N/A 11/24/2013   Procedure: LEFT HEART CATHETERIZATION WITH CORONARY  ANGIOGRAM;  Surgeon: [name], MD;  Location: [facility] CATH LAB;  Service: Cardiovascular;  Laterality: N/A;  . PERCUTANEOUS CORONARY ROTOBLATOR INTERVENTION (PCI-R) N/A 11/28/2013   Procedure: PERCUTANEOUS CORONARY ROTOBLATOR INTERVENTION (PCI-R);  Surgeon: [name], MD;  Location: [facility] CATH LAB;  Service: Cardiovascular;  Laterality: N/A;  . PERCUTANEOUS CORONARY STENT INTERVENTION (PCI-S)  11/24/2013   Procedure: PERCUTANEOUS CORONARY STENT INTERVENTION (PCI-S);  Surgeon: [name], MD;  Location: [facility] CATH LAB;  Service: Cardiovascular;;  . TEE WITHOUT CARDIOVERSION  07/26/2012   Procedure: TRANSESOPHAGEAL ECHOCARDIOGRAM (TEE);  Surgeon: [name], MD;  Location: [facility] ENDOSCOPY;  Service: Cardiovascular;  Laterality: N/A;  . TEE WITHOUT CARDIOVERSION N/A 08/25/2014   Procedure: TRANSESOPHAGEAL ECHOCARDIOGRAM (TEE);  Surgeon: Larey Dresser, MD;  Location: [place];  Service: Cardiovascular;  Laterality: N/A;  . TEE WITHOUT CARDIOVERSION  01/26/2015   Procedure: Transesophageal Echocardiogram (Tee);  Surgeon: [name], MD;  Location: [place] CV LAB;  Service: Cardiovascular;;  . TONSILLECTOMY  age 17  . TOTAL HIP ARTHROPLASTY  07/25/2011   Procedure: TOTAL HIP ARTHROPLASTY ANTERIOR APPROACH;  Surgeon: Mcarthur Rossetti;  Location: [facility] ORS;  Service: Orthopedics;  Laterality: Right;  . TOTAL HIP ARTHROPLASTY Left 02-08-2010  . TRANSURETHRAL RESECTION OF BLADDER TUMOR WITH GYRUS (TURBT-GYRUS) N/A 04/12/2013   Procedure: TRANSURETHRAL RESECTION OF BLADDER TUMOR WITH GYRUS (TURBT-GYRUS);  Surgeon: [name], MD;  Location: [facility];  Service: Urology;  Laterality: N/A;    FHx:    Reviewed / unchanged  SHx:    Reviewed / unchanged   Systems Review:  Constitutional: Denies fever, chills, wt changes, headaches, insomnia, fatigue, night sweats, change in appetite. Eyes: Denies redness, blurred vision, diplopia, discharge, itchy,  watery eyes.  ENT: Denies discharge, congestion, post nasal drip, epistaxis, sore throat, earache, hearing loss, dental pain, tinnitus, vertigo, sinus pain, snoring.  CV: Denies chest pain, palpitations, irregular heartbeat, syncope, dyspnea, diaphoresis, orthopnea, PND, claudication or edema. Respiratory: denies cough, dyspnea, DOE, pleurisy, hoarseness, laryngitis, wheezing.  Gastrointestinal: Denies dysphagia, odynophagia, heartburn, reflux, water brash, abdominal pain or cramps, nausea, vomiting, bloating, diarrhea, constipation, hematemesis, melena, hematochezia  or hemorrhoids. Genitourinary: Denies dysuria, frequency, urgency, nocturia, hesitancy, discharge, hematuria or flank pain. Musculoskeletal: Denies arthralgias, myalgias, stiffness, jt. swelling, pain, limping or strain/sprain.  Skin: Denies pruritus, rash, hives, warts, acne, eczema or change in skin lesion(s). Neuro: No weakness, tremor, incoordination, spasms, paresthesia or pain. Psychiatric: Denies confusion, memory loss or sensory loss. Endo: Denies change in weight, skin or hair change.  Heme/Lymph: No excessive bleeding, bruising or enlarged lymph nodes.  Physical Exam  BP 138/82   Pulse 60   Temp (!) 97 F (36.1 C)   Resp 18   Ht 5\' 9"  (1.753 m)   Wt 203 lb 9.6 oz (92.4 kg)   BMI 30.07 kg/m   Appears  well nourished, well groomed  and in no distress.  Eyes: PERRLA, EOMs, conjunctiva no swelling or erythema. Sinuses: No frontal/maxillary tenderness ENT/Mouth: EAC's clear, TM's nl w/o erythema, bulging. Nares clear w/o erythema, swelling, exudates. Oropharynx clear without erythema or exudates. Oral hygiene is good. Tongue normal, non obstructing. Hearing intact.  Neck: Supple. Thyroid not palpable. Car 2+/2+ without bruits, nodes or JVD. Chest: Respirations nl with BS clear & equal w/o rales, rhonchi, wheezing or stridor.  Cor: Heart sounds normal w/ sl irregular rate and rhythm without sig. murmurs, gallops,  clicks or rubs. Peripheral pulses normal and equal  without edema.  Abdomen: Soft & bowel sounds normal. Non-tender w/o guarding, rebound, hernias, masses or organomegaly.  Lymphatics: Unremarkable.  Musculoskeletal: Full ROM all peripheral extremities, joint stability, 5/5 strength and normal gait.  Skin: Warm, dry without exposed rashes, lesions or ecchymosis apparent.  Neuro: Cranial nerves intact, reflexes equal bilaterally. Sensory-motor testing grossly intact. Tendon reflexes grossly intact.  Pysch: Alert & oriented x 3.  Insight and judgement nl & appropriate. No ideations.  Assessment and Plan:  1. Essential hypertension  - Continue medication, monitor blood pressure at home.  - Continue DASH diet.  Reminder to go to the ER if any CP,  SOB, nausea, dizziness, severe HA, changes vision/speech.  - CBC with Differential/Platelet - COMPLETE METABOLIC PANEL WITH GFR - Magnesium - TSH  2. Hyperlipidemia, mixed  - Continue diet/meds, exercise,& lifestyle modifications.  - Continue monitor periodic cholesterol/liver & renal functions   - Lipid panel - TSH  3. Abnormal glucose  - Continue diet, exercise  - Lifestyle modifications.  - Monitor appropriate labs.  - Hemoglobin A1c - Insulin, random  4. Vitamin  D deficiency  - Continue supplementation.  - VITAMIN D 25 Hydroxyl  5. Gastroesophageal reflux disease - CBC with Differential/Platelet  6. Paroxysmal atrial fibrillation (HCC)  - TSH  7. Exposure to Covid-19 Virus  - SAR CoV2 Serology (COVID 19)AB(IGG)IA  8. Medication management  - CBC with Differential/Platelet - COMPLETE METABOLIC PANEL WITH GFR - Magnesium - Lipid panel - TSH - Hemoglobin A1c - Insulin, random - VITAMIN D 25 Hydroxyl       Discussed  regular exercise, BP monitoring, weight control to achieve/maintain BMI less than 25 and discussed med and SE's. Recommended labs to assess and monitor clinical status with further disposition  pending results of labs.  I discussed the assessment and treatment plan with the patient. The patient was provided an opportunity to ask questions and all were answered. The patient agreed with the plan and demonstrated an understanding of the instructions. I provided over 30 minutes of exam, counseling, chart review and  complex critical decision making.   Kirtland Bouchard, MD

## 2019-01-06 NOTE — Patient Instructions (Signed)

## 2019-01-07 LAB — COMPLETE METABOLIC PANEL WITH GFR
AG Ratio: 1.8 (calc) (ref 1.0–2.5)
ALT: 21 U/L (ref 9–46)
AST: 16 U/L (ref 10–35)
Albumin: 4.2 g/dL (ref 3.6–5.1)
Alkaline phosphatase (APISO): 56 U/L (ref 35–144)
BUN: 16 mg/dL (ref 7–25)
CO2: 28 mmol/L (ref 20–32)
Calcium: 9.6 mg/dL (ref 8.6–10.3)
Chloride: 107 mmol/L (ref 98–110)
Creat: 0.85 mg/dL (ref 0.70–1.18)
GFR, Est African American: 100 mL/min/{1.73_m2} (ref >=60)
GFR, Est Non African American: 86 mL/min/{1.73_m2} (ref >=60)
Globulin: 2.3 g/dL (calc) (ref 1.9–3.7)
Glucose, Bld: 88 mg/dL (ref 65–99)
Potassium: 4.1 mmol/L (ref 3.5–5.3)
Sodium: 142 mmol/L (ref 135–146)
Total Bilirubin: 1 mg/dL (ref 0.2–1.2)
Total Protein: 6.5 g/dL (ref 6.1–8.1)

## 2019-01-07 LAB — CBC WITH DIFFERENTIAL/PLATELET
Absolute Monocytes: 548 cells/uL (ref 200–950)
Basophils Absolute: 50 cells/uL (ref 0–200)
Basophils Relative: 0.8 %
Eosinophils Absolute: 202 cells/uL (ref 15–500)
Eosinophils Relative: 3.2 %
HCT: 46.6 % (ref 38.5–50.0)
Hemoglobin: 15.9 g/dL (ref 13.2–17.1)
Lymphs Abs: 1487 cells/uL (ref 850–3900)
MCH: 30.5 pg (ref 27.0–33.0)
MCHC: 34.1 g/dL (ref 32.0–36.0)
MCV: 89.3 fL (ref 80.0–100.0)
MPV: 9.8 fL (ref 7.5–12.5)
Monocytes Relative: 8.7 %
Neutro Abs: 4013 cells/uL (ref 1500–7800)
Neutrophils Relative %: 63.7 %
Platelets: 192 10*3/uL (ref 140–400)
RBC: 5.22 10*6/uL (ref 4.20–5.80)
RDW: 12 % (ref 11.0–15.0)
Total Lymphocyte: 23.6 %
WBC: 6.3 10*3/uL (ref 3.8–10.8)

## 2019-01-07 LAB — LIPID PANEL
Cholesterol: 168 mg/dL (ref <200)
HDL: 53 mg/dL (ref >=40)
LDL Cholesterol (Calc): 96 mg/dL (calc)
Non-HDL Cholesterol (Calc): 115 mg/dL (calc) (ref <130)
Total CHOL/HDL Ratio: 3.2 (calc) (ref <5.0)
Triglycerides: 96 mg/dL (ref <150)

## 2019-01-07 LAB — HEMOGLOBIN A1C
Hgb A1c MFr Bld: 5.4 % of total Hgb (ref <5.7)
Mean Plasma Glucose: 108 (calc)
eAG (mmol/L): 6 (calc)

## 2019-01-07 LAB — SAR COV2 SEROLOGY (COVID19)AB(IGG),IA: SARS CoV2 AB IGG: NEGATIVE

## 2019-01-07 LAB — TSH: TSH: 1.39 mIU/L (ref 0.40–4.50)

## 2019-01-07 LAB — INSULIN, RANDOM: Insulin: 3.4 u[IU]/mL

## 2019-01-07 LAB — MAGNESIUM: Magnesium: 2 mg/dL (ref 1.5–2.5)

## 2019-01-07 LAB — VITAMIN D 25 HYDROXY (VIT D DEFICIENCY, FRACTURES): Vit D, 25-Hydroxy: 99 ng/mL (ref 30–100)

## 2019-01-08 ENCOUNTER — Other Ambulatory Visit: Payer: Self-pay | Admitting: Internal Medicine

## 2019-01-08 DIAGNOSIS — E559 Vitamin D deficiency, unspecified: Secondary | ICD-10-CM

## 2019-01-08 DIAGNOSIS — I10 Essential (primary) hypertension: Secondary | ICD-10-CM

## 2019-01-08 DIAGNOSIS — E782 Mixed hyperlipidemia: Secondary | ICD-10-CM

## 2019-01-08 MED ORDER — VITAMIN D3 125 MCG (5000 UT) PO CAPS: ORAL_CAPSULE | ORAL | Status: DC

## 2019-01-08 MED ORDER — EZETIMIBE 10 MG PO TABS: 10.0000 mg | ORAL_TABLET | Freq: Every day | ORAL | 1 refills | Status: DC

## 2019-01-08 MED ORDER — EZETIMIBE 10 MG PO TABS: ORAL_TABLET | ORAL | 3 refills | Status: DC

## 2019-01-08 MED ORDER — BISOPROLOL-HYDROCHLOROTHIAZIDE 10-6.25 MG PO TABS: ORAL_TABLET | ORAL | 3 refills | Status: DC

## 2019-01-08 MED ORDER — RIVAROXABAN 20 MG PO TABS: ORAL_TABLET | ORAL | 0 refills | Status: DC

## 2019-01-08 MED ORDER — DILTIAZEM HCL ER COATED BEADS 180 MG PO CP24: ORAL_CAPSULE | ORAL | 3 refills | Status: DC

## 2019-01-08 MED ORDER — ADULT MULTIVITAMIN W/MINERALS CH: ORAL_TABLET | ORAL | Status: AC

## 2019-01-09 ENCOUNTER — Encounter: Payer: Self-pay | Admitting: Internal Medicine

## 2019-02-13 ENCOUNTER — Other Ambulatory Visit: Payer: Self-pay | Admitting: Internal Medicine

## 2019-02-13 ENCOUNTER — Other Ambulatory Visit: Payer: Self-pay | Admitting: Adult Health

## 2019-02-13 DIAGNOSIS — I10 Essential (primary) hypertension: Secondary | ICD-10-CM

## 2019-02-28 ENCOUNTER — Other Ambulatory Visit: Payer: Self-pay

## 2019-02-28 ENCOUNTER — Ambulatory Visit (INDEPENDENT_AMBULATORY_CARE_PROVIDER_SITE_OTHER): Payer: Medicare Other | Admitting: Adult Health

## 2019-02-28 ENCOUNTER — Encounter: Payer: Self-pay | Admitting: Adult Health

## 2019-02-28 VITALS — BP 126/74 | HR 59 | Temp 97.3°F | Ht 69.0 in | Wt 204.0 lb

## 2019-02-28 DIAGNOSIS — B029 Zoster without complications: Secondary | ICD-10-CM

## 2019-02-28 MED ORDER — VALACYCLOVIR HCL 1 G PO TABS: 1000.0000 mg | ORAL_TABLET | Freq: Three times a day (TID) | ORAL | 0 refills | Status: DC

## 2019-02-28 MED ORDER — PREDNISONE 20 MG PO TABS: ORAL_TABLET | ORAL | 0 refills | Status: AC

## 2019-02-28 MED ORDER — GABAPENTIN 300 MG PO CAPS: ORAL_CAPSULE | ORAL | 2 refills | Status: DC

## 2019-02-28 NOTE — Patient Instructions (Addendum)
Gabapentin - can take during the day if needed - stick to low doses, may cause drowsiness  Call insurance and ask about shingles vaccine - zostavax vs shingrix    Recombinant Zoster (Shingles) Vaccine: What You Need to Know 1. Why get vaccinated? Recombinant zoster (shingles) vaccine can prevent shingles. Shingles (also called herpes zoster, or just zoster) is a painful skin rash, usually with blisters. In addition to the rash, shingles can cause fever, headache, chills, or upset stomach. More rarely, shingles can lead to pneumonia, hearing problems, blindness, brain inflammation (encephalitis), or death. The most common complication of shingles is long-term nerve pain called postherpetic neuralgia (PHN). PHN occurs in the areas where the shingles rash was, even after the rash clears up. It can last for months or years after the rash goes away. The pain from PHN can be severe and debilitating. About 10 to 18% of people who get shingles will experience PHN. The risk of PHN increases with age. An older adult with shingles is more likely to develop PHN and have longer lasting and more severe pain than a younger person with shingles. Shingles is caused by the varicella zoster virus, the same virus that causes chickenpox. After you have chickenpox, the virus stays in your body and can cause shingles later in life. Shingles cannot be passed from one person to another, but the virus that causes shingles can spread and cause chickenpox in someone who had never had chickenpox or received chickenpox vaccine. 2. Recombinant shingles vaccine Recombinant shingles vaccine provides strong protection against shingles. By preventing shingles, recombinant shingles vaccine also protects against PHN. Recombinant shingles vaccine is the preferred vaccine for the prevention of shingles. However, a different vaccine, live shingles vaccine, may be used in some circumstances. The recombinant shingles vaccine is recommended  for adults 50 years and older without serious immune problems. It is given as a two-dose series. This vaccine is also recommended for people who have already gotten another type of shingles vaccine, the live shingles vaccine. There is no live virus in this vaccine. Shingles vaccine may be given at the same time as other vaccines. 3. Talk with your health care provider Tell your vaccine provider if the person getting the vaccine:  Has had an allergic reaction after a previous dose of recombinant shingles vaccine, or has any severe, life-threatening allergies.  Is pregnant or breastfeeding.  Is currently experiencing an episode of shingles. In some cases, your health care provider may decide to postpone shingles vaccination to a future visit. People with minor illnesses, such as a cold, may be vaccinated. People who are moderately or severely ill should usually wait until they recover before getting recombinant shingles vaccine. Your health care provider can give you more information. 4. Risks of a vaccine reaction  A sore arm with mild or moderate pain is very common after recombinant shingles vaccine, affecting about 80% of vaccinated people. Redness and swelling can also happen at the site of the injection.  Tiredness, muscle pain, headache, shivering, fever, stomach pain, and nausea happen after vaccination in more than half of people who receive recombinant shingles vaccine. In clinical trials, about 1 out of 6 people who got recombinant zoster vaccine experienced side effects that prevented them from doing regular activities. Symptoms usually went away on their own in 2 to 3 days. You should still get the second dose of recombinant zoster vaccine even if you had one of these reactions after the first dose. People sometimes faint after medical procedures,  including vaccination. Tell your provider if you feel dizzy or have vision changes or ringing in the ears. As with any medicine, there is  a very remote chance of a vaccine causing a severe allergic reaction, other serious injury, or death. 5. What if there is a serious problem? An allergic reaction could occur after the vaccinated person leaves the clinic. If you see signs of a severe allergic reaction (hives, swelling of the face and throat, difficulty breathing, a fast heartbeat, dizziness, or weakness), call 9-1-1 and get the person to the nearest hospital. For other signs that concern you, call your health care provider. Adverse reactions should be reported to the Vaccine Adverse Event Reporting System (VAERS). Your health care provider will usually file this report, or you can do it yourself. Visit the VAERS website at www.vaers.SamedayNews.es or call 318-826-3750. VAERS is only for reporting reactions, and VAERS staff do not give medical advice. 6. How can I learn more?  Ask your health care provider.  Call your local or state health department.  Contact the Centers for Disease Control and Prevention (CDC): ? Call 801-037-9247 (1-800-CDC-INFO) or ? Visit CDC's website at http://hunter.com/ Vaccine Information Statement Recombinant Zoster Vaccine (05/19/2018) This information is not intended to replace advice given to you by your health care provider. Make sure you discuss any questions you have with your health care provider. Document Released: 09/16/2016 Document Revised: 10/26/2018 Document Reviewed: 02/10/2018 Elsevier Patient Education  [place], which is also known as herpes zoster, is an infection that causes a painful skin rash and fluid-filled blisters. It is caused by a virus. Shingles only develops in people who:  Have had chickenpox.  Have been given a medicine to protect against chickenpox (have been vaccinated). Shingles is rare in this group. What are the causes? Shingles is caused by varicella-zoster virus (VZV). This is the same virus that causes chickenpox. After a  person is exposed to VZV, the virus stays in the body in an inactive (dormant) state. Shingles develops if the virus is reactivated. This can happen many years after the first (initial) exposure to VZV. It is not known what causes this virus to be reactivated. What increases the risk? People who have had chickenpox or received the chickenpox vaccine are at risk for shingles. Shingles infection is more common in people who:  Are older than age 95.  Have a weakened disease-fighting system (immune system), such as people with: ? HIV. ? AIDS. ? Cancer.  Are taking medicines that weaken the immune system, such as transplant medicines.  Are experiencing a lot of stress. What are the signs or symptoms? Early symptoms of this condition include itching, tingling, and pain in an area on your skin. Pain may be described as burning, stabbing, or throbbing. A few days or weeks after early symptoms start, a painful red rash appears. The rash is usually on one side of the body and has a band-like or belt-like pattern. The rash eventually turns into fluid-filled blisters that break open, change into scabs, and dry up in about 2-3 weeks. At any time during the infection, you may also develop:  A fever.  Chills.  A headache.  An upset stomach. How is this diagnosed? This condition is diagnosed with a skin exam. Skin or fluid samples may be taken from the blisters before a diagnosis is made. These samples are examined under a microscope or sent to a lab for testing. How is this treated?  The rash may last for several weeks. There is not a specific cure for this condition. Your health care provider will probably prescribe medicines to help you manage pain, recover more quickly, and avoid long-term problems. Medicines may include:  Antiviral drugs.  Anti-inflammatory drugs.  Pain medicines.  Anti-itching medicines (antihistamines). If the area involved is on your face, you may be referred to a  specialist, such as an eye doctor (ophthalmologist) or an ear, nose, and throat (ENT) doctor (otolaryngologist) to help you avoid eye problems, chronic pain, or disability. Follow these instructions at home: Medicines  Take over-the-counter and prescription medicines only as told by your health care provider.  Apply an anti-itch cream or numbing cream to the affected area as told by your health care provider. Relieving itching and discomfort   Apply cold, wet cloths (cold compresses) to the area of the rash or blisters as told by your health care provider.  Cool baths can be soothing. Try adding baking soda or dry oatmeal to the water to reduce itching. Do not bathe in hot water. Blister and rash care  Keep your rash covered with a loose bandage (dressing). Wear loose-fitting clothing to help ease the pain of material rubbing against the rash.  Keep your rash and blisters clean by washing the area with mild soap and cool water as told by your health care provider.  Check your rash every day for signs of infection. Check for: ? More redness, swelling, or pain. ? Fluid or blood. ? Warmth. ? Pus or a bad smell.  Do not scratch your rash or pick at your blisters. To help avoid scratching: ? Keep your fingernails clean and cut short. ? Wear gloves or mittens while you sleep, if scratching is a problem. General instructions  Rest as told by your health care provider.  Keep all follow-up visits as told by your health care provider. This is important.  Wash your hands often with soap and water. If soap and water are not available, use hand sanitizer. Doing this lowers your chance of getting a bacterial skin infection.  Before your blisters change into scabs, your shingles infection can cause chickenpox in people who have never had it or have never been vaccinated against it. To prevent this from happening, avoid contact with other people, especially: ? Babies. ? Pregnant  women. ? Children who have eczema. ? Elderly people who have transplants. ? People who have chronic illnesses, such as cancer or AIDS. Contact a health care provider if:  Your pain is not relieved with prescribed medicines.  Your pain does not get better after the rash heals.  You have signs of infection in the rash area, such as: ? More redness, swelling, or pain around the rash. ? Fluid or blood coming from the rash. ? The rash area feeling warm to the touch. ? Pus or a bad smell coming from the rash. Get help right away if:  The rash is on your face or nose.  You have facial pain, pain around your eye area, or loss of feeling on one side of your face.  You have difficulty seeing.  You have ear pain or have ringing in your ear.  You have a loss of taste.  Your condition gets worse. Summary  Shingles, which is also known as herpes zoster, is an infection that causes a painful skin rash and fluid-filled blisters.  This condition is diagnosed with a skin exam. Skin or fluid samples may be taken from the  blisters and examined before the diagnosis is made.  Keep your rash covered with a loose bandage (dressing). Wear loose-fitting clothing to help ease the pain of material rubbing against the rash.  Before your blisters change into scabs, your shingles infection can cause chickenpox in people who have never had it or have never been vaccinated against it. This information is not intended to replace advice given to you by your health care provider. Make sure you discuss any questions you have with your health care provider. Document Released: 07/07/2005 Document Revised: 10/29/2018 Document Reviewed: 03/11/2017 Elsevier Patient Education  [place].     Valganciclovir tablets What is this medicine? VALGANCICLOVIR (val gan SYE kloh veer) is an antiviral medicine. It is used to treat or prevent infections caused by certain kinds of viruses. It is commonly used to  treat and prevent cytomegalovirus (CMV) infections of the eye and body. This medicine may be used for other purposes; ask your health care provider or pharmacist if you have questions. COMMON BRAND NAME(S): Valcyte What should I tell my health care provider before I take this medicine? They need to know if you have any of these conditions:  kidney disease  low blood counts, like low white cell, platelet, or red cell counts  an unusual or allergic reaction to ganciclovir, valganciclovir, other medicines, foods, dyes, or preservatives  pregnant or trying to get pregnant  breast-feeding How should I use this medicine? Take this medicine by mouth with a full glass of water. Follow the directions on the prescription label. Take with food. Do not chew or crush. Do not touch broken tablets with bare hands. Wash skin with soap and water if broken tablets touch your skin. Take your medicine at regular intervals. Do not take your medicine more often than directed. Take all of your medicine as directed even if you think you are better. Do not skip doses or stop your medicine early. Talk to your pediatrician regarding the use of this medicine in children. While this drug may be prescribed for children as young as 1 month for selected conditions, precautions do apply. Overdosage: If you think you have taken too much of this medicine contact a poison control center or emergency room at once. NOTE: This medicine is only for you. Do not share this medicine with others. What if I miss a dose? If you miss a dose, take it as soon as you can. If it is almost time for your next dose, take only that dose. Do not take double or extra doses. What may interact with this medicine? This medicine may interact with the following medications:  amphotericin B  certain medicines for cancer like adriamycin, doxorubicin, vinblastine, vincristine  certain medicines for HIV like didanosine,  zidovudine  cyclosporine  dapsone  flucytosine  hydroxyurea  imipenem; cilastatin  mycophenolate mofetil  pentamidine  probenecid  sulfamethoxazole; trimethoprim  tacrolimus This list may not describe all possible interactions. Give your health care provider a list of all the medicines, herbs, non-prescription drugs, or dietary supplements you use. Also tell them if you smoke, drink alcohol, or use illegal drugs. Some items may interact with your medicine. What should I watch for while using this medicine? Visit your doctor or health care professional for regular check ups. Discuss any new symptoms with your doctor. You will need to have important blood work done while on this medicine. If you have a CMV eye infection have your eyes checked every 4 to 6 weeks. Drink 6  to 8 glasses of water or fluids daily while taking this medicine to help prevent side effects. You may get drowsy or dizzy. Do not drive, use machinery, or do anything that needs mental alertness until you know how this medicine affects you. Do not stand or sit up quickly, especially if you are an older patient. This reduces the risk of dizzy or fainting spells. Do not become pregnant while taking this medicine. Women should inform their doctor if they wish to become pregnant or think they might be pregnant. If you are a male who can become pregnant, you should use effective birth control during treatment with and for at least 30 days after treatment. There is a potential for serious side effects to an unborn child. Talk to your health care professional or pharmacist for more information. Men should not father a child while taking this medicine. Men should use a condom during sexual contact with a male partner who might become pregnant during, and for at least 90 days after, treatment. There is a potential for serious side effects to an unborn child. Talk to your health care professional or pharmacist for more  information. What side effects may I notice from receiving this medicine? Side effects that you should report to your doctor or health care professional as soon as possible:  allergic reactions like skin rash, itching or hives, swelling of the face, lips, or tongue  breathing problems  changes in vision  feeling faint or lightheaded, falls  fever or chills, sore throat  hallucination, loss of contact with reality  pain, tingling, numbness in the hands or feet  seizures  signs and symptoms of kidney injury like trouble passing urine or change in the amount of urine  suicidal thoughts or other mood changes  unusual bleeding or bruising  unusually weak or tired Side effects that usually do not require medical attention (report to your doctor or health care professional if they continue or are bothersome):  constipation  cough  diarrhea  loss of appetite  nausea, vomiting  stomach pain  trouble sleeping This list may not describe all possible side effects. Call your doctor for medical advice about side effects. You may report side effects to FDA at 1-800-FDA-1088. Where should I keep my medicine? Keep out of the reach of children. Store at room temperature between 15 to 30 degrees C (59 to 86 degrees F). Throw away any unused medicine after the expiration date. NOTE: This sheet is a summary. It may not cover all possible information. If you have questions about this medicine, talk to your doctor, pharmacist, or health care provider.  2020 Elsevier/Gold Standard (2016-01-15 13:42:13)    Gabapentin capsules or tablets What is this medicine? GABAPENTIN (GA ba pen tin) is used to control seizures in certain types of epilepsy. It is also used to treat certain types of nerve pain. This medicine may be used for other purposes; ask your health care provider or pharmacist if you have questions. COMMON BRAND NAME(S): Active-PAC with Gabapentin, Gabarone, Neurontin What  should I tell my health care provider before I take this medicine? They need to know if you have any of these conditions:  history of drug abuse or alcohol abuse problem  kidney disease  lung or breathing disease  suicidal thoughts, plans, or attempt; a previous suicide attempt by you or a family member  an unusual or allergic reaction to gabapentin, other medicines, foods, dyes, or preservatives  pregnant or trying to get pregnant  breast-feeding  How should I use this medicine? Take this medicine by mouth with a glass of water. Follow the directions on the prescription label. You can take it with or without food. If it upsets your stomach, take it with food. Take your medicine at regular intervals. Do not take it more often than directed. Do not stop taking except on your doctor's advice. If you are directed to break the 600 or 800 mg tablets in half as part of your dose, the extra half tablet should be used for the next dose. If you have not used the extra half tablet within 28 days, it should be thrown away. A special MedGuide will be given to you by the pharmacist with each prescription and refill. Be sure to read this information carefully each time. Talk to your pediatrician regarding the use of this medicine in children. While this drug may be prescribed for children as young as 3 years for selected conditions, precautions do apply. Overdosage: If you think you have taken too much of this medicine contact a poison control center or emergency room at once. NOTE: This medicine is only for you. Do not share this medicine with others. What if I miss a dose? If you miss a dose, take it as soon as you can. If it is almost time for your next dose, take only that dose. Do not take double or extra doses. What may interact with this medicine? This medicine may interact with the following medications:  alcohol  antihistamines for allergy, cough, and cold  certain medicines for anxiety or  sleep  certain medicines for depression like amitriptyline, fluoxetine, sertraline  certain medicines for seizures like phenobarbital, primidone  certain medicines for stomach problems  general anesthetics like halothane, isoflurane, methoxyflurane, propofol  local anesthetics like lidocaine, pramoxine, tetracaine  medicines that relax muscles for surgery  narcotic medicines for pain  phenothiazines like chlorpromazine, mesoridazine, prochlorperazine, thioridazine This list may not describe all possible interactions. Give your health care provider a list of all the medicines, herbs, non-prescription drugs, or dietary supplements you use. Also tell them if you smoke, drink alcohol, or use illegal drugs. Some items may interact with your medicine. What should I watch for while using this medicine? Visit your doctor or health care provider for regular checks on your progress. You may want to keep a record at home of how you feel your condition is responding to treatment. You may want to share this information with your doctor or health care provider at each visit. You should contact your doctor or health care provider if your seizures get worse or if you have any new types of seizures. Do not stop taking this medicine or any of your seizure medicines unless instructed by your doctor or health care provider. Stopping your medicine suddenly can increase your seizures or their severity. This medicine may cause serious skin reactions. They can happen weeks to months after starting the medicine. Contact your health care provider right away if you notice fevers or flu-like symptoms with a rash. The rash may be red or purple and then turn into blisters or peeling of the skin. Or, you might notice a red rash with swelling of the face, lips or lymph nodes in your neck or under your arms. Wear a medical identification bracelet or chain if you are taking this medicine for seizures, and carry a card that lists  all your medications. You may get drowsy, dizzy, or have blurred vision. Do not drive, use machinery,  or do anything that needs mental alertness until you know how this medicine affects you. To reduce dizzy or fainting spells, do not sit or stand up quickly, especially if you are an older patient. Alcohol can increase drowsiness and dizziness. Avoid alcoholic drinks. Your mouth may get dry. Chewing sugarless gum or sucking hard candy, and drinking plenty of water will help. The use of this medicine may increase the chance of suicidal thoughts or actions. Pay special attention to how you are responding while on this medicine. Any worsening of mood, or thoughts of suicide or dying should be reported to your health care provider right away. Women who become pregnant while using this medicine may enroll in the [place] Pregnancy Registry by calling [phone]. This registry collects information about the safety of antiepileptic drug use during pregnancy. What side effects may I notice from receiving this medicine? Side effects that you should report to your doctor or health care professional as soon as possible:  allergic reactions like skin rash, itching or hives, swelling of the face, lips, or tongue  breathing problems  rash, fever, and swollen lymph nodes  redness, blistering, peeling or loosening of the skin, including inside the mouth  suicidal thoughts, mood changes Side effects that usually do not require medical attention (report to your doctor or health care professional if they continue or are bothersome):  dizziness  drowsiness  headache  nausea, vomiting  swelling of ankles, feet, hands  tiredness This list may not describe all possible side effects. Call your doctor for medical advice about side effects. You may report side effects to FDA at 1-800-FDA-1088. Where should I keep my medicine? Keep out of reach of children. This medicine may cause  accidental overdose and death if it taken by other adults, children, or pets. Mix any unused medicine with a substance like cat litter or coffee grounds. Then throw the medicine away in a sealed container like a sealed bag or a coffee can with a lid. Do not use the medicine after the expiration date. Store at room temperature between 15 and 30 degrees C (59 and 86 degrees F). NOTE: This sheet is a summary. It may not cover all possible information. If you have questions about this medicine, talk to your doctor, pharmacist, or health care provider.  2020 Elsevier/Gold Standard (2018-10-08 14:16:43)

## 2019-02-28 NOTE — Progress Notes (Signed)
Assessment and Plan:  Damon [name] was seen today for herpes zoster.  Diagnoses and all orders for this visit:  Herpes zoster without complication Herpes Zoster- valcyclovir, Prednisone, gabapentin for pain. The patient was informed that the vesicles can cause chicken pox in a person that is not immune, will follow up PRN.  Discussed zostavax vs shingrix to consider in a few months after he has recovered Follow up if pain not resolving;  -     valACYclovir (VALTREX) 1000 MG tablet; Take 1 tablet (1,000 mg total) by mouth 3 (three) times daily. Take for 7 days -     gabapentin (NEURONTIN) 300 MG capsule; 1-3 at night for pain -     predniSONE (DELTASONE) 20 MG tablet; 1 pill 3 x a day for 3 days, 1 pill 2 x a day x 3 days, 1 pill a day x 5 days with food  Further disposition pending results of labs. Discussed med's effects and SE's.   Over 15 minutes of exam, counseling, chart review, and critical decision making was performed.   Future Appointments  Date Time Provider [place]  03/02/2019  2:45 PM [name], MD [facility]  04/14/2019  9:00 AM Liane Comber, NP [facility] None  08/04/2019  3:00 PM [name], MD [facility] None    ------------------------------------------------------------------------------------------------------------------   HPI BP 126/Damon   Pulse (!) 59   Temp (!) 97.3 F (36.3 C)   Ht 5\' 9"  (1.753 m)   Wt 204 lb (92.5 kg)   SpO2 95%   BMI 30.13 kg/m   Damon [name] presents for evaluation of rash which he believes is shingles; he reports rash to right axilla and upper back with pruritis and burning pain that appeared 3 days ago; he does report he has unusual tenderness/pain to extremity prior to onset of rash.   He has tried applying gold bond which has helped some with itching  Does have hx of shingles in 74s and ? In 24s   Past Medical History:  Diagnosis Date  . Bladder cancer ([place])   . Hearing loss of both ears   .  History of colon polyps    BENIGN  . Hyperlipidemia   . Hypertension   . Mitral regurgitation   . OSA on CPAP    cpap settign of 13  . PAD (peripheral artery disease) ([facility])    ABI--  RIGHT SIDE NORMAL LEFT SIDE MODERATELY REDUCED W/ DISTAL LEFT SFA STENOSIS//  MILD CALDICATION  . PAF (paroxysmal atrial fibrillation) ([place])     a. s/p PVI 2014; b. s/p redo PVI and CTI 01/2015  . Pre-diabetes   . Vitamin D deficiency      Allergies  Allergen Reactions  . Zetia [Ezetimibe] Other (See Comments)    myalgias     Current Outpatient Medications on File Prior to Visit  Medication Sig  . ALPRAZolam (XANAX) 1 MG tablet Take 1/2-1 tablet at Bedtime ONLY if needed &  limit to 5 days /week to avoid addiction  . bisoprolol-hydrochlorothiazide (ZIAC) 10-6.25 MG tablet Take 1 tablet Daily for BP  . Cholecalciferol (VITAMIN D3) 125 MCG (5000 UT) CAPS Take 10,000 units Daily  . diltiazem (CARDIZEM CD) 180 MG 24 hr capsule Take 1 capsule Daily for BP & Heart Rhythm  . ezetimibe (ZETIA) 10 MG tablet Take 1 tablet Daily for Cholesterol  . Multiple Vitamin (MULTIVITAMIN WITH MINERALS) TABS tablet Take 1 tablet Daily  . NITROSTAT 0.4 MG SL tablet DISSOLVE ONE TABLET UNDER THE TONGUE  EVERY 5 MINUTES AS NEEDED FOR CHEST PAIN.  DO NOT EXCEED A TOTAL OF 3 DOSES IN 15 MINUTES  . Polyvinyl Alcohol-Povidone (REFRESH OP) Place 1 drop into both eyes daily as needed (dry eyes).  . rivaroxaban (XARELTO) 20 MG TABS tablet Take 1 tablet Daily to prevent blood clots  . rosuvastatin (CRESTOR) 40 MG tablet Take 1 tablet Daily for Cholesterol   No current facility-administered medications on file prior to visit.     ROS: Review of Systems  Constitutional: Negative for chills, fever, malaise/fatigue and weight loss.  HENT: Negative.   Eyes: Negative.   Respiratory: Negative.   Cardiovascular: Negative.   Gastrointestinal: Negative.   Genitourinary: Negative.   Skin: Positive for rash.  Neurological: Negative for  dizziness, tingling and headaches.  Psychiatric/Behavioral: The patient is not nervous/anxious and does not have insomnia.      Physical Exam:  BP 126/Damon   Pulse (!) 59   Temp (!) 97.3 F (36.3 C)   Ht 5\' 9"  (1.753 m)   Wt 204 lb (92.5 kg)   SpO2 95%   BMI 30.13 kg/m   General Appearance: Well nourished, in no apparent distress. Eyes: conjunctiva no swelling or erythema ENT/Mouth: Hearing normal.  Neck: Supple Respiratory: Respiratory effort normal, BS equal bilaterally without rales, rhonchi, wheezing or stridor.  Cardio: RRR with no MRGs. Brisk peripheral pulses without edema.  Abdomen: Soft, + BS.  Non tender, no guarding, rebound, hernias, masses. Lymphatics: Non tender without lymphadenopathy.  Musculoskeletal: Full ROM, symmetrical strength, normal gait. No obvious deformity, effusion.  Skin: Warm, dry; he has rash to right upper back and in axilla; erythematous base with bullae/vesicles with clear discharge; follows linear dermatome patter Neuro: Cranial nerves intact. Normal muscle tone Psych: Awake and oriented X 3, normal affect, Insight and Judgment appropriate.     Izora Ribas, NP 11:06 AM [facility]

## 2019-03-02 ENCOUNTER — Encounter: Payer: Self-pay | Admitting: Internal Medicine

## 2019-03-02 ENCOUNTER — Other Ambulatory Visit: Payer: Self-pay

## 2019-03-02 ENCOUNTER — Ambulatory Visit (INDEPENDENT_AMBULATORY_CARE_PROVIDER_SITE_OTHER): Payer: Medicare Other | Admitting: Internal Medicine

## 2019-03-02 VITALS — BP 140/74 | HR 65 | Ht 69.0 in | Wt 198.0 lb

## 2019-03-02 DIAGNOSIS — I10 Essential (primary) hypertension: Secondary | ICD-10-CM

## 2019-03-02 DIAGNOSIS — I4819 Other persistent atrial fibrillation: Secondary | ICD-10-CM | POA: Diagnosis not present

## 2019-03-02 DIAGNOSIS — G4733 Obstructive sleep apnea (adult) (pediatric): Secondary | ICD-10-CM | POA: Diagnosis not present

## 2019-03-02 NOTE — Progress Notes (Signed)
PCP: [name], MD Primary Cardiologist: Angelena Form Primary EP: Damon [name] is a 73 y.o. male who presents today for routine electrophysiology followup.  Since last being seen in our clinic, the patient reports doing very well. He is recovering from shingles.  Today, he denies symptoms of palpitations, chest pain, shortness of breath,  lower extremity edema, dizziness, presyncope, or syncope.  The patient is otherwise without complaint today.   Past Medical History:  Diagnosis Date  . Bladder cancer ([place])   . Hearing loss of both ears   . History of colon polyps    BENIGN  . Hyperlipidemia   . Hypertension   . Mitral regurgitation   . OSA on CPAP    cpap settign of 13  . PAD (peripheral artery disease) ([facility])    ABI--  RIGHT SIDE NORMAL LEFT SIDE MODERATELY REDUCED W/ DISTAL LEFT SFA STENOSIS//  MILD CALDICATION  . PAF (paroxysmal atrial fibrillation) ([place])     a. s/p PVI 2014; b. s/p redo PVI and CTI 01/2015  . Pre-diabetes   . Vitamin D deficiency    Past Surgical History:  Procedure Laterality Date  . ABLATION OF DYSRHYTHMIC FOCUS  01/26/2015  . ATRIAL FIBRILLATION ABLATION N/A 07/27/2012   Procedure: ATRIAL FIBRILLATION ABLATION;  Surgeon: [name], MD;  Location: [facility] CATH LAB;  Service: Cardiovascular;  Laterality: N/A;  . ATRIAL FIBRILLATION ABLATION N/A 08/25/2018   Procedure: ATRIAL FIBRILLATION ABLATION;  Surgeon: [name], MD;  Location: [place] CV LAB;  Service: Cardiovascular;  Laterality: N/A;  . CARDIAC ELECTROPHYSIOLOGY STUDY AND ABLATION  07-27-2012  Damon Odaliz Mcqueary   SUCCESSFUL ABLATION OF A-FIB  . CARDIOVERSION  08/06/2012   Procedure: CARDIOVERSION;  Surgeon: [name], MD;  Location: [facility] ENDOSCOPY;  Service: Cardiovascular;  Laterality: N/A;  . CARDIOVERSION N/A 08/25/2014   Procedure: CARDIOVERSION;  Surgeon: Larey Dresser, MD;  Location: [place];  Service: Cardiovascular;  Laterality: N/A;  . CARDIOVERSION N/A 11/13/2014   Procedure: CARDIOVERSION;  Surgeon: Sueanne Margarita, MD;  Location: [facility] ENDOSCOPY;  Service: Cardiovascular;  Laterality: N/A;  . CARDIOVERSION N/A 12/28/2014   Procedure: CARDIOVERSION;  Surgeon: [name], MD;  Location: [place];  Service: Cardiovascular;  Laterality: N/A;  . CARDIOVERSION N/A 01/30/2017   Procedure: CARDIOVERSION;  Surgeon: Josue Hector, MD;  Location: [facility] ENDOSCOPY;  Service: Cardiovascular;  Laterality: N/A;  . CARDIOVERSION N/A 04/06/2017   Procedure: CARDIOVERSION;  Surgeon: Dorothy Spark, MD;  Location: [facility] ENDOSCOPY;  Service: Cardiovascular;  Laterality: N/A;  . CARDIOVERSION N/A 05/25/2018   Procedure: CARDIOVERSION;  Surgeon: Skeet Latch, MD;  Location: [facility] ENDOSCOPY;  Service: Cardiovascular;  Laterality: N/A;  . CARDIOVERSION N/A 08/09/2018   Procedure: CARDIOVERSION;  Surgeon: Buford Dresser, MD;  Location: [facility] ENDOSCOPY;  Service: Cardiovascular;  Laterality: N/A;  . CATARACT EXTRACTION W/ INTRAOCULAR LENS  IMPLANT, BILATERAL    . COLONOSCOPY WITH PROPOFOL N/A 02/13/2015   Procedure: COLONOSCOPY WITH PROPOFOL;  Surgeon: Garlan Fair, MD;  Location: [facility] ENDOSCOPY;  Service: Endoscopy;  Laterality: N/A;  . CYSTOSCOPY W/ RETROGRADES Bilateral 04/12/2013   Procedure: CYSTOSCOPY WITH RETROGRADE PYELOGRAM;  Surgeon: [name], MD;  Location: [facility];  Service: Urology;  Laterality: Bilateral;  . ELECTROPHYSIOLOGIC STUDY N/A 01/26/2015   Procedure: Atrial Fibrillation Ablation;  Surgeon: [name], MD;  Location: [place] CV LAB;  Service: Cardiovascular;  Laterality: N/A;  . LEFT HEART CATHETERIZATION WITH CORONARY ANGIOGRAM N/A 11/24/2013   Procedure: LEFT HEART CATHETERIZATION  WITH CORONARY ANGIOGRAM;  Surgeon: [name], MD;  Location: [facility] CATH LAB;  Service: Cardiovascular;  Laterality: N/A;  . PERCUTANEOUS CORONARY ROTOBLATOR INTERVENTION (PCI-R) N/A 11/28/2013   Procedure: PERCUTANEOUS CORONARY ROTOBLATOR  INTERVENTION (PCI-R);  Surgeon: [name], MD;  Location: [facility] CATH LAB;  Service: Cardiovascular;  Laterality: N/A;  . PERCUTANEOUS CORONARY STENT INTERVENTION (PCI-S)  11/24/2013   Procedure: PERCUTANEOUS CORONARY STENT INTERVENTION (PCI-S);  Surgeon: [name], MD;  Location: [facility] CATH LAB;  Service: Cardiovascular;;  . TEE WITHOUT CARDIOVERSION  07/26/2012   Procedure: TRANSESOPHAGEAL ECHOCARDIOGRAM (TEE);  Surgeon: [name], MD;  Location: [facility] ENDOSCOPY;  Service: Cardiovascular;  Laterality: N/A;  . TEE WITHOUT CARDIOVERSION N/A 08/25/2014   Procedure: TRANSESOPHAGEAL ECHOCARDIOGRAM (TEE);  Surgeon: Larey Dresser, MD;  Location: [place];  Service: Cardiovascular;  Laterality: N/A;  . TEE WITHOUT CARDIOVERSION  01/26/2015   Procedure: Transesophageal Echocardiogram (Tee);  Surgeon: [name], MD;  Location: [place] CV LAB;  Service: Cardiovascular;;  . TONSILLECTOMY  age 50  . TOTAL HIP ARTHROPLASTY  07/25/2011   Procedure: TOTAL HIP ARTHROPLASTY ANTERIOR APPROACH;  Surgeon: Mcarthur Rossetti;  Location: [facility] ORS;  Service: Orthopedics;  Laterality: Right;  . TOTAL HIP ARTHROPLASTY Left 02-08-2010  . TRANSURETHRAL RESECTION OF BLADDER TUMOR WITH GYRUS (TURBT-GYRUS) N/A 04/12/2013   Procedure: TRANSURETHRAL RESECTION OF BLADDER TUMOR WITH GYRUS (TURBT-GYRUS);  Surgeon: [name], MD;  Location: [facility];  Service: Urology;  Laterality: N/A;    ROS- all systems are reviewed and negatives except as per HPI above  Current Outpatient Medications  Medication Sig Dispense Refill  . ALPRAZolam (XANAX) 1 MG tablet Take 1/2-1 tablet at Bedtime ONLY if needed &  limit to 5 days /week to avoid addiction 30 tablet 0  . bisoprolol-hydrochlorothiazide (ZIAC) 10-6.25 MG tablet Take 1 tablet Daily for BP 90 tablet 3  . Cholecalciferol (VITAMIN D3) 125 MCG (5000 UT) CAPS Take 10,000 units Daily    . diltiazem (CARDIZEM CD) 180 MG 24 hr capsule Take  1 capsule Daily for BP & Heart Rhythm 90 capsule 3  . ezetimibe (ZETIA) 10 MG tablet Take 1 tablet Daily for Cholesterol 90 tablet 3  . gabapentin (NEURONTIN) 300 MG capsule 1-3 at night for pain 90 capsule 2  . Multiple Vitamin (MULTIVITAMIN WITH MINERALS) TABS tablet Take 1 tablet Daily    . NITROSTAT 0.4 MG SL tablet DISSOLVE ONE TABLET UNDER THE TONGUE EVERY 5 MINUTES AS NEEDED FOR CHEST PAIN.  DO NOT EXCEED A TOTAL OF 3 DOSES IN 15 MINUTES 25 tablet 6  . Polyvinyl Alcohol-Povidone (REFRESH OP) Place 1 drop into both eyes daily as needed (dry eyes).    . predniSONE (DELTASONE) 20 MG tablet 1 pill 3 x a day for 3 days, 1 pill 2 x a day x 3 days, 1 pill a day x 5 days with food 20 tablet 0  . rivaroxaban (XARELTO) 20 MG TABS tablet Take 1 tablet Daily to prevent blood clots 90 tablet 0  . rosuvastatin (CRESTOR) 40 MG tablet Take 1 tablet Daily for Cholesterol 90 tablet 3  . valACYclovir (VALTREX) 1000 MG tablet Take 1 tablet (1,000 mg total) by mouth 3 (three) times daily. Take for 7 days 21 tablet 0   No current facility-administered medications for this visit.     Physical Exam: Vitals:   03/02/19 1451  BP: 140/74  Pulse: 65  SpO2: 96%  Weight: 198 lb (89.8 kg)  Height: 5\' 9"  (  1.753 m)    GEN- The patient is well appearing, alert and oriented x 3 today.   Head- normocephalic, atraumatic Eyes-  Sclera clear, conjunctiva pink Ears- hearing intact Oropharynx- clear Lungs-  normal work of breathing Heart- Regular rate and rhythm  GI- soft, NT, ND, + BS Extremities- no clubbing, cyanosis, or edema  Wt Readings from Last 3 Encounters:  03/02/19 198 lb (89.8 kg)  02/28/19 204 lb (92.5 kg)  01/06/19 203 lb 9.6 oz (92.4 kg)    EKG tracing ordered today is personally reviewed and shows sinus  Assessment and Plan:  1. Persistent atrial fibrillation Well controlled post ablation On xarelto for chads2vasc score of 3. Consider reducing diltiazem on return  2. OSA Compliant  with CPAP  3. HTN Stable No change required today  4. CAD No ischemic symptoms  Follow-up in AF clinic in 6 months I will see in a year  [name] MD, [facility] 03/02/2019 2:54 PM

## 2019-03-02 NOTE — Patient Instructions (Addendum)
Medication Instructions:  Your physician recommends that you continue on your current medications as directed. Please refer to the Current Medication list given to you today.  * If you need a refill on your cardiac medications before your next appointment, please call your pharmacy.   Labwork: None ordered *We will only notify you of abnormal results, otherwise continue current treatment plan.  Testing/Procedures: None ordered  Follow-Up: Your physician wants you to follow-up in: 6 months with the AFib clinic. You will receive a reminder letter in the mail two months in advance. If you don't receive a letter, please call our office to schedule the follow-up appointment.  Your physician wants you to follow-up in: 1 year with Dr. [name].  You will receive a reminder letter in the mail two months in advance. If you don't receive a letter, please call our office to schedule the follow-up appointment.  Thank you for choosing [facility]!!

## 2019-03-18 ENCOUNTER — Other Ambulatory Visit (HOSPITAL_COMMUNITY): Payer: Self-pay | Admitting: Nurse Practitioner

## 2019-04-05 ENCOUNTER — Ambulatory Visit: Payer: Self-pay | Admitting: Adult Health

## 2019-04-12 DIAGNOSIS — I480 Paroxysmal atrial fibrillation: Secondary | ICD-10-CM | POA: Insufficient documentation

## 2019-04-12 NOTE — Progress Notes (Signed)
Patient ID: Damon [name], male   DOB: 1946/07/18, 73 y.o.   MRN: [id number]  MEDICARE ANNUAL WELLNESS VISIT AND OV  Assessment:    Encounter for Medicare annual wellness exam  Essential hypertension - continue medications, DASH diet, exercise and monitor at home. Call if greater than 130/80.  - CBC with Differential/Platelet - CMP/GFR - TSH   Paroxysmal atrial fibrillation (HCC) Rate controlled; successful ablation 08/25/2018 Continue follow up cardio Continue xarelto  CAD Control blood pressure, cholesterol, glucose, increase exercise.  Followed by cardiology No recent chest pain/nitroglycerine use   Hyperlipidemia -continue medications, check lipids, decrease fatty foods, increase activity.  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    Malignant neoplasm of urinary bladder, unspecified site [facility]) Continue close monitoring by urology  OSA on CPAP Sleep apnea- continue CPAP, weight loss advised.    Medication management - Magnesium   Mitral regurgitation Continue cardio follow up   Peripheral arterial disease (HCC) Control blood pressure, cholesterol, glucose, increase exercise.   Gastroesophageal reflux disease, esophagitis presence not specified Continue PPI/H2 blocker, diet discussed  BMI 29 Long discussion about weight loss, diet, and exercise Recommended diet heavy in fruits and veggies and low in animal meats, cheeses, and dairy products, appropriate calorie intake Discussed appropriate weight for height Follow up at next visit  Insomnia - good sleep hygiene discussed, increase  day time activity, try melatonin or benadryl  Try to limit benzo use to <5 x/week  History of colon polyps Due 3 year colonoscopy ?, unclear, patient will call to schedule - phone number given   follows with [facility]  Bilateral hearing loss High frequency only, doesn't want hearing aids at this time  Lumbar back with pain bilateral radiculopathy Intermittent symptoms, neg straight leg Prednisone was prescribed,NSAIDs, RICE, and exercise given If not better follow up in office or will refer to PT/orthopedics. Natural history and expected course discussed. Questions answered. Neurosurgeon distributed. Proper lifting, bending technique discussed. Follow up if not resolving will obtain xray or refer to ortho    Future Appointments  Date Time Provider [place]  08/04/2019  3:00 PM [name], MD [facility] None     Plan:   During the course of the visit the patient was educated and counseled about appropriate screening and preventive services including:    Pneumococcal vaccine   Influenza vaccine  Td vaccine  Screening electrocardiogram  Bone densitometry screening  Colorectal cancer screening  Diabetes screening  Glaucoma screening  Nutrition counseling   Advanced directives: requested  Subjective:   Damon [name]  presents for Medicare Annual Wellness Visit and 3 month follow up for Hypertension, Hyperlipidemia, Pre-Diabetes and Vitamin D Deficiency.   Patient is prescribed xanax which he takes primarily for insomnia; he currently takes 1/4-1/2 tabs most evenings.   In Sept 2014 , Patient had TUR of a Bladder cancer by Dr Tresa Moore, follows regularly, last seen 05/2018  He is recovering from shingles of R chest with some residual neuralgia; he takes gabapentin at night to manage.   He has OSA and is on a CPAP  He reports several weeks of intermittent back pain, thought he slept wrong,  then aggravated after lifting his dog into the truck,  improved and reports no current pain after resting all day yesterday. Pain with walking and lifting, minimal with sitting or lying down. When painful he does intermittently report radiating pains down bilateral extremites. Does report mild tingling in bilateral toes intermittently. Has not tried NSAIDs.   BMI is Body mass index is 29.8 kg/m., he has been working on diet and exercise; not in last few weeks due to back pain, typically golfs  Wt Readings from Last 3 Encounters:  04/14/19 201 lb 12.8 oz (91.5 kg)  03/02/19 198 lb (89.8 kg)  02/28/19 204 lb (92.5 kg)   Patient has HTN. Today's BP: 128/78.   Patient has ASCAD  S/p PCA/stenting followed by Rotator Ablation in 2015 (Dr Aundra Dubin).   Patient also has hx/o pAfib (on Xarelto) with RFA  x 2 in 2014/2016. He has had several episodes in 2019 with successful conversion; patient went successful ablation in Feb 2020 and has remained in sinus rhythm. He has PAD, denies recent symptoms, improved with increased exercise.  Patient has had no complaints of any cardiac type chest pain, palpitations, dyspnea/orthopnea/PND, dizziness, claudication, or dependent edema.  Hyperlipidemia is controlled with diet & meds, he is on crestor, taking 20 mg daily. Patient denies myalgias or other med SE's. Last Lipids were at goal -   Lab Results  Component Value Date   CHOL 168 01/06/2019   HDL 53 01/06/2019   LDLCALC 96 01/06/2019   TRIG 96 01/06/2019   CHOLHDL 3.2 01/06/2019   Also, the patient has history of PreDiabetes with A1c 5.7% in 2011 and 5.9% in 2013, and then 5.6% x 2 in late 2013 now controlled by diet.  He's had no symptoms of reactive hypoglycemia, diabetic polys, paresthesias or visual blurring.   Lab Results  Component Value Date   HGBA1C 5.4 01/06/2019   Further, the patient also has history of Vitamin D Deficiency of 27 in 2008 and supplements vitamin D without any suspected side-effects. Lab Results  Component Value Date   VD25OH 99  01/06/2019   Lab Results  Component Value Date   GFRNONAA 86 01/06/2019      Medication Review: Current Outpatient Medications on File Prior to Visit  Medication Sig  . ALPRAZolam (XANAX) 1 MG tablet Take 1/2-1 tablet at Bedtime ONLY if needed &  limit to 5 days /week to avoid addiction  . bisoprolol-hydrochlorothiazide (ZIAC) 10-6.25 MG tablet Take 1 tablet Daily for BP  . Cholecalciferol (VITAMIN D3) 125 MCG (5000 UT) CAPS Take 10,000 units Daily  . diltiazem (CARTIA XT) 180 MG 24 hr capsule Take 1 capsule by mouth once daily  . ezetimibe (ZETIA) 10 MG tablet Take 1 tablet Daily for Cholesterol  . gabapentin (NEURONTIN) 300 MG capsule 1-3 at night for pain  . Multiple Vitamin (MULTIVITAMIN WITH MINERALS) TABS tablet Take 1 tablet Daily  . NITROSTAT 0.4 MG SL tablet DISSOLVE ONE TABLET UNDER THE TONGUE EVERY 5 MINUTES AS NEEDED FOR CHEST PAIN.  DO NOT EXCEED A TOTAL OF 3 DOSES IN 15 MINUTES  . Polyvinyl Alcohol-Povidone (REFRESH OP) Place 1 drop into both eyes daily as needed (dry eyes).  . rivaroxaban (XARELTO) 20 MG TABS tablet Take 1 tablet Daily to prevent blood clots  . rosuvastatin (CRESTOR) 40 MG tablet Take 1 tablet Daily for Cholesterol  . valACYclovir (VALTREX) 1000 MG tablet Take 1 tablet (1,000 mg total) by mouth 3 (three) times daily. Take for 7 days  No current facility-administered medications on file prior to visit.     Current Problems (verified) Patient Active Problem List   Diagnosis Date Noted  . PAF (paroxysmal atrial fibrillation) ([place])   . Insomnia 03/23/2018  . BMI 29.0-29.9,adult 08/17/2017  . PAD (peripheral artery disease) ([place])   . OSA on CPAP   . History of colon polyps   . Hearing loss of both ears   . Bladder cancer ([place]) 02/16/2015  . Gastroesophageal reflux disease 02/16/2015  . CAD- CFX PTCA 11/24/13 with plans for DES 11/28/13 11/25/2013  . Hyperlipidemia, mixed 06/22/2013  . Abnormal glucose 06/22/2013  . Vitamin D deficiency 06/22/2013   . Essential hypertension   . Mitral regurgitation     Screening Tests Immunization History  Administered Date(s) Administered  . DT 07/22/2003, 12/28/2013  . Influenza Whole 05/19/2013  . Influenza, High Dose Seasonal PF 04/08/2017  . Influenza,inj,Quad PF,6+ Mos 05/09/2014  . Influenza-Unspecified 05/14/2015, 05/01/2016, 05/17/2018  . Pneumococcal Conjugate-13 07/11/2014  . Pneumococcal Polysaccharide-23 12/04/2009, 06/10/2016  . Tdap 09/22/2013   TDAP 2015 Influenza 2019 out of vaccine in office  Prevnar 13 2015 Pneumonia 2011, 2017  CT chest 10/2014  Preventative care: Last colonoscopy: 04/2016 at [facility], rec. 2020 follow up - patient will call   Names of Other Physician/Practitioners you currently use: 1. [facility] here for primary care 2. Dr Sherley Bounds, eye doctor, last visit 04/2018 3. ? Name of his dentist, dentist, last visit 2014, several years  Patient Care Team: [name], MD as PCP - General (Internal Medicine) [name], MD as PCP - Cardiology (Cardiology) Dwan Bolt, MD as Referring Physician (Cardiology) [name], MD as Consulting Physician (Cardiology) [name], MD as Consulting Physician (Urology) Mcarthur Rossetti, MD as Consulting Physician (Orthopedic Surgery) Ponciano Ort, MD as Referring Physician (Urology) Garlan Fair, MD as Consulting Physician (Gastroenterology)   Allergies Allergies  Allergen Reactions  . Zetia [Ezetimibe] Other (See Comments)    myalgias     SURGICAL HISTORY He  has a past surgical history that includes Total hip arthroplasty (07/25/2011); TEE without cardioversion (07/26/2012); Cardioversion (08/06/2012); Total hip arthroplasty (Left, 02-08-2010); Cataract extraction w/ intraocular lens  implant, bilateral; Cardiac electrophysiology study and ablation (07-27-2012  DR ALLRED); Tonsillectomy (age 52); Cystoscopy w/  retrogrades (Bilateral, 04/12/2013); Transurethral resection of bladder tumor with gyrus (turbt-gyrus) (N/A, 04/12/2013); atrial fibrillation ablation (N/A, 07/27/2012); left heart catheterization with coronary angiogram (N/A, 11/24/2013); percutaneous coronary stent intervention (pci-s) (11/24/2013); percutaneous coronary rotoblator intervention (pci-r) (N/A, 11/28/2013); TEE without cardioversion (N/A, 08/25/2014); Cardioversion (N/A, 08/25/2014); Cardioversion (N/A, 11/13/2014); Cardioversion (N/A, 12/28/2014); Cardiac catheterization (N/A, 01/26/2015); TEE without cardioversion (01/26/2015); Ablation of dysrhythmic focus (01/26/2015); Colonoscopy with propofol (N/A, 02/13/2015); Cardioversion (N/A, 01/30/2017); Cardioversion (N/A, 04/06/2017); Cardioversion (N/A, 05/25/2018); Cardioversion (N/A, 08/09/2018); and ATRIAL FIBRILLATION ABLATION (N/A, 08/25/2018). FAMILY HISTORY His family history includes Heart attack in his mother; Heart disease in his father and mother; Heart failure in his father. SOCIAL HISTORY He  reports that he quit smoking about 37 years ago. His smoking use included cigarettes. He has a 20.00 pack-year smoking history. He has never used smokeless tobacco. He reports that he does not drink alcohol or use drugs.  MEDICARE WELLNESS OBJECTIVES: Physical activity: Current Exercise Habits: Home exercise routine, Type of exercise: Other - see comments(golfing, cyclling), Time (Minutes): 45, Frequency (Times/Week): 5, Weekly Exercise (Minutes/Week): 225, Intensity: Mild, Exercise limited by: None identified Cardiac risk factors: Cardiac Risk Factors include: advanced age (>6men, >68 women);dyslipidemia;hypertension;male  gender;smoking/ tobacco exposure Depression/mood screen:   Depression screen [facility] 2/9 04/14/2019  Decreased Interest 0  Down, Depressed, Hopeless 0  PHQ - 2 Score 0    ADLs:  In your present state of health, do you have any difficulty performing the following activities: 04/14/2019 01/09/2019   Hearing? Y Y  Comment declines hearing aids, has been tested and recommended, high frequency only admits poor hearing, but defers getting hearing aids  Vision? N -  Difficulty concentrating or making decisions? N -  Walking or climbing stairs? N -  Dressing or bathing? N -  Doing errands, shopping? N -  Some recent data might be hidden     Cognitive Testing  Alert? Yes  Normal Appearance?Yes  Oriented to person? Yes  Place? Yes   Time? Yes  Recall of three objects?  Yes  Can perform simple calculations? Yes  Displays appropriate judgment?Yes  Can read the correct time from a watch face?Yes  EOL planning: Does Patient Have a Medical Advance Directive?: No Would patient like information on creating a medical advance directive?: No - Patient declined    Objective:     Blood pressure 128/78, pulse 67, temperature (!) 97.5 F (36.4 C), height 5\' 9"  (1.753 m), weight 201 lb 12.8 oz (91.5 kg), SpO2 96 %.   General Appearance:  Alert  WD/WN, male  in no apparent distress. Eyes: PERRLA, EOMs nl, conjunctiva normal, normal fundi and vessels. Sinuses: No frontal/maxillary tenderness ENT/Mouth: EACs patent / TMs  nl. Nares clear without erythema, swelling, mucoid exudates. Oral hygiene is good. No erythema, swelling, or exudate. Tongue normal, non-obstructing. Tonsils not swollen or erythematous. Hearing normal.  Neck: Supple, thyroid normal. No bruits, nodes or JVD. Respiratory: Respiratory effort normal.  BS equal and clear bilateral without rales, rhonci, wheezing or stridor. Cardio: Heart sounds are normal with regular rate and rhythm and no murmurs, rubs or gallops. Peripheral pulses are normal and equal bilaterally without edema. No aortic or femoral bruits. Chest: symmetric with normal excursions and percussion.  Abdomen: Flat, soft, with nl bowel sounds. No guarding, rebound, hernias, masses, or organomegaly. Mild suprapubic tenderness Lymphatics: Non tender without  lymphadenopathy.  Musculoskeletal: Full ROM all peripheral extremities, joint stability, 5/5 strength, and normal gait. Patient is able to ambulate well. Gait is not  Antalgic. Straight leg raising with dorsiflexion negative bilaterally for radicular symptoms. Sensory exam in the legs are normal. Knee reflexes are normal Ankle reflexes are normal Strength is normal and symmetric in arms and legs. There is not SI tenderness to palpation.  There is not paraspinal muscle spasm.  There is not midline tenderness.  ROM of spine with  limited in flexion or extension due to pain.   Skin: Warm and dry without rashes, lesions, cyanosis, clubbing or  ecchymosis.  Neuro: Cranial nerves intact, reflexes equal bilaterally. Normal muscle tone, no cerebellar symptoms. Sensation intact.  Pysch: Alert and oriented X 3 with normal affect, insight and judgment appropriate.    Medicare Attestation I have personally reviewed: The patient's medical and social history Their use of alcohol, tobacco or illicit drugs Their current medications and supplements The patient's functional ability including ADLs,fall risks, home safety risks, cognitive, and hearing and visual impairment Diet and physical activities Evidence for depression or mood disorders  The patient's weight, height, BMI, and visual acuity have been recorded in the chart.  I have made referrals, counseling, and provided education to the patient based on review of the above and I have provided the  patient with a written personalized care plan for preventive services.  Over 40 minutes of exam, counseling, chart review was performed.  Izora Ribas, NP   04/14/2019

## 2019-04-14 ENCOUNTER — Encounter: Payer: Self-pay | Admitting: Adult Health

## 2019-04-14 ENCOUNTER — Ambulatory Visit (INDEPENDENT_AMBULATORY_CARE_PROVIDER_SITE_OTHER): Payer: Medicare Other | Admitting: Adult Health

## 2019-04-14 ENCOUNTER — Other Ambulatory Visit: Payer: Self-pay

## 2019-04-14 VITALS — BP 128/78 | HR 67 | Temp 97.5°F | Ht 69.0 in | Wt 201.8 lb

## 2019-04-14 DIAGNOSIS — Z6829 Body mass index (BMI) 29.0-29.9, adult: Secondary | ICD-10-CM

## 2019-04-14 DIAGNOSIS — I340 Nonrheumatic mitral (valve) insufficiency: Secondary | ICD-10-CM | POA: Diagnosis not present

## 2019-04-14 DIAGNOSIS — I482 Chronic atrial fibrillation, unspecified: Secondary | ICD-10-CM

## 2019-04-14 DIAGNOSIS — R6889 Other general symptoms and signs: Secondary | ICD-10-CM

## 2019-04-14 DIAGNOSIS — E782 Mixed hyperlipidemia: Secondary | ICD-10-CM

## 2019-04-14 DIAGNOSIS — K219 Gastro-esophageal reflux disease without esophagitis: Secondary | ICD-10-CM

## 2019-04-14 DIAGNOSIS — Z0000 Encounter for general adult medical examination without abnormal findings: Secondary | ICD-10-CM

## 2019-04-14 DIAGNOSIS — G4733 Obstructive sleep apnea (adult) (pediatric): Secondary | ICD-10-CM

## 2019-04-14 DIAGNOSIS — Z9989 Dependence on other enabling machines and devices: Secondary | ICD-10-CM

## 2019-04-14 DIAGNOSIS — Z1159 Encounter for screening for other viral diseases: Secondary | ICD-10-CM

## 2019-04-14 DIAGNOSIS — R7309 Other abnormal glucose: Secondary | ICD-10-CM

## 2019-04-14 DIAGNOSIS — M545 Low back pain: Secondary | ICD-10-CM

## 2019-04-14 DIAGNOSIS — Z0001 Encounter for general adult medical examination with abnormal findings: Secondary | ICD-10-CM

## 2019-04-14 DIAGNOSIS — G4700 Insomnia, unspecified: Secondary | ICD-10-CM

## 2019-04-14 DIAGNOSIS — I739 Peripheral vascular disease, unspecified: Secondary | ICD-10-CM

## 2019-04-14 DIAGNOSIS — I10 Essential (primary) hypertension: Secondary | ICD-10-CM

## 2019-04-14 DIAGNOSIS — H9193 Unspecified hearing loss, bilateral: Secondary | ICD-10-CM

## 2019-04-14 DIAGNOSIS — M79604 Pain in right leg: Secondary | ICD-10-CM

## 2019-04-14 DIAGNOSIS — I480 Paroxysmal atrial fibrillation: Secondary | ICD-10-CM

## 2019-04-14 DIAGNOSIS — C679 Malignant neoplasm of bladder, unspecified: Secondary | ICD-10-CM

## 2019-04-14 DIAGNOSIS — Z86010 Personal history of colonic polyps: Secondary | ICD-10-CM

## 2019-04-14 DIAGNOSIS — I2510 Atherosclerotic heart disease of native coronary artery without angina pectoris: Secondary | ICD-10-CM

## 2019-04-14 DIAGNOSIS — E559 Vitamin D deficiency, unspecified: Secondary | ICD-10-CM

## 2019-04-14 MED ORDER — PREDNISONE 20 MG PO TABS: ORAL_TABLET | ORAL | 0 refills | Status: DC

## 2019-04-14 NOTE — Patient Instructions (Addendum)
Damon [name] , Thank you for taking time to come for your Medicare Wellness Visit. I appreciate your ongoing commitment to your health goals. Please review the following plan we discussed and let me know if I can assist you in the future.   These are the goals we discussed: Goals    . Blood Pressure < 130/80    . LDL CALC < 70    . Weight (lb) < 190 lb (86.2 kg)       This is a list of the screening recommended for you and due dates:  Health Maintenance  Topic Date Due  .  Hepatitis C: One time screening is recommended by Center for Disease Control  (CDC) for  adults born from [birth year] through 1966.   07/30/1945  . Flu Shot  02/19/2019  . Colon Cancer Screening  04/22/2021  . Tetanus Vaccine  09/23/2023  . Pneumonia vaccines  Completed    Please call Dr. Durenda Age office (gastroenterology) to ask about colonoscopy possibly being due  -  [phone]  Follow up if pain pain is not resolving in the next 3-4 weeks  Recommend avoiding golf/twisting until fully resolved    Acute Back Pain, Adult Acute back pain is sudden and usually short-lived. It is often caused by an injury to the muscles and tissues in the back. The injury may result from:  A muscle or ligament getting overstretched or torn (strained). Ligaments are tissues that connect bones to each other. Lifting something improperly can cause a back strain.  Wear and tear (degeneration) of the spinal disks. Spinal disks are circular tissue that provides cushioning between the bones of the spine (vertebrae).  Twisting motions, such as while playing sports or doing yard work.  A hit to the back.  Arthritis. You may have a physical exam, lab tests, and imaging tests to find the cause of your pain. Acute back pain usually goes away with rest and home care. Follow these instructions at home: Managing pain, stiffness, and swelling  Take over-the-counter and prescription medicines only as told by your health care provider.  Your  health care provider may recommend applying ice during the first 24-48 hours after your pain starts. To do this: ? Put ice in a plastic bag. ? Place a towel between your skin and the bag. ? Leave the ice on for 20 minutes, 2-3 times a day.  If directed, apply heat to the affected area as often as told by your health care provider. Use the heat source that your health care provider recommends, such as a moist heat pack or a heating pad. ? Place a towel between your skin and the heat source. ? Leave the heat on for 20-30 minutes. ? Remove the heat if your skin turns bright red. This is especially important if you are unable to feel pain, heat, or cold. You have a greater risk of getting burned. Activity   Do not stay in bed. Staying in bed for more than 1-2 days can delay your recovery.  Sit up and stand up straight. Avoid leaning forward when you sit, or hunching over when you stand. ? If you work at a desk, sit close to it so you do not need to lean over. Keep your chin tucked in. Keep your neck drawn back, and keep your elbows bent at a right angle. Your arms should look like the letter "L." ? Sit high and close to the steering wheel when you drive. Add lower back (lumbar) support  to your car seat, if needed.  Take short walks on even surfaces as soon as you are able. Try to increase the length of time you walk each day.  Do not sit, drive, or stand in one place for more than 30 minutes at a time. Sitting or standing for long periods of time can put stress on your back.  Do not drive or use heavy machinery while taking prescription pain medicine.  Use proper lifting techniques. When you bend and lift, use positions that put less stress on your back: ? [place] your knees. ? Keep the load close to your body. ? Avoid twisting.  Exercise regularly as told by your health care provider. Exercising helps your back heal faster and helps prevent back injuries by keeping muscles strong and  flexible.  Work with a physical therapist to make a safe exercise program, as recommended by your health care provider. Do any exercises as told by your physical therapist. Lifestyle  Maintain a healthy weight. Extra weight puts stress on your back and makes it difficult to have good posture.  Avoid activities or situations that make you feel anxious or stressed. Stress and anxiety increase muscle tension and can make back pain worse. Learn ways to manage anxiety and stress, such as through exercise. General instructions  Sleep on a firm mattress in a comfortable position. Try lying on your side with your knees slightly bent. If you lie on your back, put a pillow under your knees.  Follow your treatment plan as told by your health care provider. This may include: ? Cognitive or behavioral therapy. ? Acupuncture or massage therapy. ? Meditation or yoga. Contact a health care provider if:  You have pain that is not relieved with rest or medicine.  You have increasing pain going down into your legs or buttocks.  Your pain does not improve after 2 weeks.  You have pain at night.  You lose weight without trying.  You have a fever or chills. Get help right away if:  You develop new bowel or bladder control problems.  You have unusual weakness or numbness in your arms or legs.  You develop nausea or vomiting.  You develop abdominal pain.  You feel faint. Summary  Acute back pain is sudden and usually short-lived.  Use proper lifting techniques. When you bend and lift, use positions that put less stress on your back.  Take over-the-counter and prescription medicines and apply heat or ice as directed by your health care provider. This information is not intended to replace advice given to you by your health care provider. Make sure you discuss any questions you have with your health care provider. Document Released: 07/07/2005 Document Revised: 10/26/2018 Document Reviewed:  02/18/2017 Elsevier Patient Education  2020 [employer].

## 2019-04-15 LAB — CBC WITH DIFFERENTIAL/PLATELET
Absolute Monocytes: 902 cells/uL (ref 200–950)
Basophils Absolute: 56 cells/uL (ref 0–200)
Basophils Relative: 0.6 %
Eosinophils Absolute: 141 cells/uL (ref 15–500)
Eosinophils Relative: 1.5 %
HCT: 46.8 % (ref 38.5–50.0)
Hemoglobin: 16 g/dL (ref 13.2–17.1)
Lymphs Abs: 1551 cells/uL (ref 850–3900)
MCH: 30.8 pg (ref 27.0–33.0)
MCHC: 34.2 g/dL (ref 32.0–36.0)
MCV: 90.2 fL (ref 80.0–100.0)
MPV: 9.9 fL (ref 7.5–12.5)
Monocytes Relative: 9.6 %
Neutro Abs: 6749 cells/uL (ref 1500–7800)
Neutrophils Relative %: 71.8 %
Platelets: 201 10*3/uL (ref 140–400)
RBC: 5.19 10*6/uL (ref 4.20–5.80)
RDW: 12.8 % (ref 11.0–15.0)
Total Lymphocyte: 16.5 %
WBC: 9.4 10*3/uL (ref 3.8–10.8)

## 2019-04-15 LAB — COMPLETE METABOLIC PANEL WITH GFR
AG Ratio: 2.2 (calc) (ref 1.0–2.5)
ALT: 46 U/L (ref 9–46)
AST: 29 U/L (ref 10–35)
Albumin: 4.4 g/dL (ref 3.6–5.1)
Alkaline phosphatase (APISO): 59 U/L (ref 35–144)
BUN: 16 mg/dL (ref 7–25)
CO2: 26 mmol/L (ref 20–32)
Calcium: 9.5 mg/dL (ref 8.6–10.3)
Chloride: 107 mmol/L (ref 98–110)
Creat: 0.87 mg/dL (ref 0.70–1.18)
GFR, Est African American: 99 mL/min/{1.73_m2} (ref >=60)
GFR, Est Non African American: 86 mL/min/{1.73_m2} (ref >=60)
Globulin: 2 g/dL (calc) (ref 1.9–3.7)
Glucose, Bld: 96 mg/dL (ref 65–99)
Potassium: 3.8 mmol/L (ref 3.5–5.3)
Sodium: 142 mmol/L (ref 135–146)
Total Bilirubin: 1.5 mg/dL — ABNORMAL HIGH (ref 0.2–1.2)
Total Protein: 6.4 g/dL (ref 6.1–8.1)

## 2019-04-15 LAB — LIPID PANEL
Cholesterol: 137 mg/dL (ref <200)
HDL: 51 mg/dL (ref >=40)
LDL Cholesterol (Calc): 67 mg/dL (calc)
Non-HDL Cholesterol (Calc): 86 mg/dL (calc) (ref <130)
Total CHOL/HDL Ratio: 2.7 (calc) (ref <5.0)
Triglycerides: 100 mg/dL (ref <150)

## 2019-04-15 LAB — HEPATITIS C ANTIBODY
Hepatitis C Ab: NONREACTIVE
SIGNAL TO CUT-OFF: 0 (ref <1.00)

## 2019-04-15 LAB — MAGNESIUM: Magnesium: 2 mg/dL (ref 1.5–2.5)

## 2019-04-15 LAB — TSH: TSH: 0.78 mIU/L (ref 0.40–4.50)

## 2019-04-18 ENCOUNTER — Other Ambulatory Visit (HOSPITAL_COMMUNITY): Payer: Self-pay | Admitting: Nurse Practitioner

## 2019-04-21 ENCOUNTER — Other Ambulatory Visit: Payer: Self-pay | Admitting: Adult Health

## 2019-04-21 ENCOUNTER — Telehealth: Payer: Self-pay

## 2019-04-21 DIAGNOSIS — M545 Low back pain, unspecified: Secondary | ICD-10-CM

## 2019-04-21 NOTE — Telephone Encounter (Signed)
Patient requesting a referral to a specialist for the continuing pain in his back and legs. Not getting any better since he was seen.

## 2019-04-29 ENCOUNTER — Other Ambulatory Visit: Payer: Self-pay | Admitting: Orthopaedic Surgery

## 2019-04-29 DIAGNOSIS — M5136 Other intervertebral disc degeneration, lumbar region: Secondary | ICD-10-CM

## 2019-05-02 ENCOUNTER — Ambulatory Visit: Payer: Medicare Other

## 2019-05-02 ENCOUNTER — Other Ambulatory Visit: Payer: Self-pay

## 2019-05-02 ENCOUNTER — Ambulatory Visit (INDEPENDENT_AMBULATORY_CARE_PROVIDER_SITE_OTHER): Payer: Medicare Other | Admitting: Orthopaedic Surgery

## 2019-05-02 ENCOUNTER — Encounter: Payer: Self-pay | Admitting: Orthopaedic Surgery

## 2019-05-02 DIAGNOSIS — M5440 Lumbago with sciatica, unspecified side: Secondary | ICD-10-CM | POA: Diagnosis not present

## 2019-05-02 MED ORDER — ACETAMINOPHEN-CODEINE #3 300-30 MG PO TABS: 1.0000 | ORAL_TABLET | Freq: Three times a day (TID) | ORAL | 0 refills | Status: DC | PRN

## 2019-05-02 NOTE — Progress Notes (Signed)
Office Visit Note   Patient: Damon [name]           Date of Birth: 08/24/1945           MRN: [id number] Visit Date: 05/02/2019              Requested by: Liane Comber, NP [address] [place],  [place] [address] PCP: [name], MD   Assessment & Plan: Visit Diagnoses:  1. 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  I will try at least a short course of Tylenol 3.  All question concerns were answered addressed.  He will follow-up with his spine specialist.  Follow-up with [place] will be as needed.  Follow-Up Instructions: Return if symptoms worsen or fail to improve.   Orders:  Orders Placed This Encounter  Procedures   XR Lumbar Spine 2-3 Views   Meds ordered this encounter  Medications   acetaminophen-codeine (TYLENOL #3) 300-30 MG tablet    Sig: Take 1-2 tablets by mouth every 8 (eight) hours as needed for moderate pain.    Dispense:  30 tablet    Refill:  0      Procedures: No procedures performed   Clinical Data: No additional findings.   Subjective: Chief Complaint  Patient presents with   Lower Back - Pain  The patient is someone I performed bilateral hip replacements on before.  He comes in with low back pain for about a month radiates down both legs.  He has had no actual injury but he has been lifting his dog a lot lately into his vehicle and that is causing spasm in his back.  He says sitting and laying down are okay but he cannot stand and walk for too long.  His eyes reviewing his chart I saw that he does have an MRI that is been ordered for his lumbar spine.  They just order this on Friday.  After talking with him it was  apparent that he had been to [facility] which I felt was appropriate but he want to see me since have seen him before as well.  He denies any change in bowel bladder function and says his hip replacements are doing well.  Upon reviewing his medications it sounds like they did start him on Lyrica.  He is also on Xarelto so he cannot take blood thinners.  He is not taking narcotic pain medication.  HPI  Review of Systems He currently denies any headache, chest pain, shortness of breath, fever, chills, nausea, vomiting  Objective: Vital Signs: There were no vitals taken for this visit.  Physical Exam He is alert and orient x3 and in no acute distress Ortho Exam Examination of his lower extremities shows a negative straight leg raise bilaterally with good strength in both of his legs.  He has significant pain with flexion extension lumbar spine as well as limitations of flexion and extension. Specialty Comments:  No specialty comments available.  Imaging: Xr Lumbar Spine 2-3 Views  Result Date: 05/02/2019 2 views of the lumbar spine show severe arthritic changes at multiple levels throughout the lumbar spine.  There is degenerative disc disease and  osteophytes at multiple levels and severe foraminal stenosis.    PMFS History: Patient Active Problem List   Diagnosis Date Noted   PAF (paroxysmal atrial fibrillation) ([place])    Insomnia 03/23/2018   BMI 29.0-29.9,adult 08/17/2017   PAD (peripheral artery disease) (HCC)    OSA on CPAP    History of colon polyps    Hearing loss of both ears    Bladder cancer ([place]) 02/16/2015   Gastroesophageal reflux disease 02/16/2015   CAD- CFX PTCA 11/24/13 with plans for DES 11/28/13 11/25/2013   Hyperlipidemia, mixed 06/22/2013   Abnormal glucose 06/22/2013   Vitamin D deficiency 06/22/2013   Essential hypertension    Mitral regurgitation    Past Medical History:  Diagnosis Date   Bladder cancer  ([place])    Hearing loss of both ears    History of colon polyps    BENIGN   Hyperlipidemia    Hypertension    Mitral regurgitation    OSA on CPAP    cpap settign of 13   PAD (peripheral artery disease) (HCC)    ABI--  RIGHT SIDE NORMAL LEFT SIDE MODERATELY REDUCED W/ DISTAL LEFT SFA STENOSIS//  MILD CALDICATION   PAF (paroxysmal atrial fibrillation) ([place])     a. s/p PVI 2014; b. s/p redo PVI and CTI 01/2015   Pre-diabetes    Vitamin D deficiency     Family History  Problem Relation Age of Onset   Heart attack Mother    Heart disease Mother    Heart failure Father    Heart disease Father     Past Surgical History:  Procedure Laterality Date   ABLATION OF DYSRHYTHMIC FOCUS  01/26/2015   ATRIAL FIBRILLATION ABLATION N/A 07/27/2012   Procedure: ATRIAL FIBRILLATION ABLATION;  Surgeon: [name], MD;  Location: [place] CATH LAB;  Service: Cardiovascular;  Laterality: N/A;   ATRIAL FIBRILLATION ABLATION N/A 08/25/2018   Procedure: ATRIAL FIBRILLATION ABLATION;  Surgeon: [name], MD;  Location: [place] CV LAB;  Service: Cardiovascular;  Laterality: N/A;   CARDIAC ELECTROPHYSIOLOGY STUDY AND ABLATION  07-27-2012  DR [name]   SUCCESSFUL ABLATION OF A-FIB   CARDIOVERSION  08/06/2012   Procedure: CARDIOVERSION;  Surgeon: [name], MD;  Location: [facility];  Service: Cardiovascular;  Laterality: N/A;   CARDIOVERSION N/A 08/25/2014   Procedure: CARDIOVERSION;  Surgeon: Larey Dresser, MD;  Location: [place];  Service: Cardiovascular;  Laterality: N/A;   CARDIOVERSION N/A 11/13/2014   Procedure: CARDIOVERSION;  Surgeon: Sueanne Margarita, MD;  Location: [facility] ENDOSCOPY;  Service: Cardiovascular;  Laterality: N/A;   CARDIOVERSION N/A 12/28/2014   Procedure: CARDIOVERSION;  Surgeon: [name], MD;  Location: [place];  Service: Cardiovascular;  Laterality: N/A;   CARDIOVERSION N/A 01/30/2017   Procedure: CARDIOVERSION;  Surgeon: Josue Hector, MD;  Location:  [place];  Service: Cardiovascular;  Laterality: N/A;   CARDIOVERSION N/A 04/06/2017   Procedure: CARDIOVERSION;  Surgeon: Dorothy Spark, MD;  Location: [facility] ENDOSCOPY;  Service: Cardiovascular;  Laterality: N/A;   CARDIOVERSION N/A 05/25/2018   Procedure: CARDIOVERSION;  Surgeon: Skeet Latch, MD;  Location: [place];  Service: Cardiovascular;  Laterality: N/A;   CARDIOVERSION N/A 08/09/2018   Procedure: CARDIOVERSION;  Surgeon: Buford Dresser, MD;  Location: [place];  Service: Cardiovascular;  Laterality: N/A;   CATARACT EXTRACTION W/ INTRAOCULAR LENS  IMPLANT, BILATERAL     COLONOSCOPY WITH PROPOFOL N/A 02/13/2015   Procedure: COLONOSCOPY WITH PROPOFOL;  Surgeon: Garlan Fair, MD;  Location: [facility] ENDOSCOPY;  Service: Endoscopy;  Laterality: N/A;   CYSTOSCOPY W/ RETROGRADES Bilateral 04/12/2013   Procedure: CYSTOSCOPY WITH RETROGRADE PYELOGRAM;  Surgeon: [name], MD;  Location: [facility];  Service: Urology;  Laterality: Bilateral;   ELECTROPHYSIOLOGIC STUDY N/A 01/26/2015   Procedure: Atrial Fibrillation Ablation;  Surgeon: [name], MD;  Location: [place] CV LAB;  Service: Cardiovascular;  Laterality: N/A;   LEFT HEART CATHETERIZATION WITH CORONARY ANGIOGRAM N/A 11/24/2013   Procedure: LEFT HEART CATHETERIZATION WITH CORONARY ANGIOGRAM;  Surgeon: [name], MD;  Location: [facility] CATH LAB;  Service: Cardiovascular;  Laterality: N/A;   PERCUTANEOUS CORONARY ROTOBLATOR INTERVENTION (PCI-R) N/A 11/28/2013   Procedure: PERCUTANEOUS CORONARY ROTOBLATOR INTERVENTION (PCI-R);  Surgeon: [name], MD;  Location: [facility] CATH LAB;  Service: Cardiovascular;  Laterality: N/A;   PERCUTANEOUS CORONARY STENT INTERVENTION (PCI-S)  11/24/2013   Procedure: PERCUTANEOUS CORONARY STENT INTERVENTION (PCI-S);  Surgeon: [name], MD;  Location: [facility] CATH LAB;  Service: Cardiovascular;;   TEE WITHOUT CARDIOVERSION  07/26/2012    Procedure: TRANSESOPHAGEAL ECHOCARDIOGRAM (TEE);  Surgeon: [name], MD;  Location: [facility] ENDOSCOPY;  Service: Cardiovascular;  Laterality: N/A;   TEE WITHOUT CARDIOVERSION N/A 08/25/2014   Procedure: TRANSESOPHAGEAL ECHOCARDIOGRAM (TEE);  Surgeon: Larey Dresser, MD;  Location: [place];  Service: Cardiovascular;  Laterality: N/A;   TEE WITHOUT CARDIOVERSION  01/26/2015   Procedure: Transesophageal Echocardiogram (Tee);  Surgeon: [name], MD;  Location: [place] CV LAB;  Service: Cardiovascular;;   TONSILLECTOMY  age 40   TOTAL HIP ARTHROPLASTY  07/25/2011   Procedure: TOTAL HIP ARTHROPLASTY ANTERIOR APPROACH;  Surgeon: Mcarthur Rossetti;  [facility];  Service: Orthopedics;  Laterality: Right;   TOTAL HIP ARTHROPLASTY Left 02-08-2010   TRANSURETHRAL RESECTION OF BLADDER TUMOR WITH GYRUS (TURBT-GYRUS) N/A 04/12/2013   Procedure: TRANSURETHRAL RESECTION OF BLADDER TUMOR WITH GYRUS (TURBT-GYRUS);  Surgeon: [name], MD;  Location[facility];  Service: Urology;  Laterality: N/A;   Social History   Occupational History   Not on file  Tobacco Use   Smoking status: Former Smoker    Packs/day: 2.00    Years: 10.00    Pack years: 20.00    Types: Cigarettes    Quit date: 09/20/1981    Years since quitting: 37.6   Smokeless tobacco: Never Used  Substance and Sexual Activity   Alcohol use: No    Comment: pt states he has stopped drinking alcohol   Drug use: No   Sexual activity: Not on file

## 2019-05-09 ENCOUNTER — Other Ambulatory Visit: Payer: Self-pay | Admitting: Internal Medicine

## 2019-05-14 ENCOUNTER — Ambulatory Visit
Admission: RE | Admit: 2019-05-14 | Discharge: 2019-05-14 | Disposition: A | Discharge status: Home / Self Care | Payer: Medicare Other | Source: Ambulatory Visit | Attending: Orthopaedic Surgery | Admitting: Orthopaedic Surgery

## 2019-05-14 ENCOUNTER — Other Ambulatory Visit: Payer: Self-pay

## 2019-05-14 DIAGNOSIS — M5136 Other intervertebral disc degeneration, lumbar region: Secondary | ICD-10-CM

## 2019-05-25 ENCOUNTER — Other Ambulatory Visit (HOSPITAL_COMMUNITY): Payer: Self-pay | Admitting: *Deleted

## 2019-05-25 MED ORDER — DILTIAZEM HCL ER COATED BEADS 180 MG PO CP24: ORAL_CAPSULE | ORAL | 2 refills | Status: DC

## 2019-06-09 ENCOUNTER — Telehealth: Payer: Self-pay | Admitting: *Deleted

## 2019-06-09 NOTE — Telephone Encounter (Signed)
Left message for the patient to call back and discuss with preop APP of the day

## 2019-06-09 NOTE — Telephone Encounter (Signed)
Patient with diagnosis of atrial fibrillation on Xarelto for anticoagulation.    Procedure: Colonoscopy Date of procedure: TBD  CHADS2-VASc score of 3 (HTN, AGE, CAD)  CrCl 90-100 mL/min  Per office protocol, patient can hold Xarelto for 1 day prior to procedure.

## 2019-06-09 NOTE — Telephone Encounter (Signed)
   [facility] Pre-operative Risk Assessment    Request for surgical clearance:  1. What type of surgery is being performed? COLONOSCOPY   2. When is this surgery scheduled? TBD; SOMETIME IN DEC BUT NO DATE ON CLEARANCE FORM   3. What type of clearance is required (medical clearance vs. Pharmacy clearance to hold med vs. Both)? BOTH  4. Are there any medications that need to be held prior to surgery and how long? [name]    5. Practice name and name of physician performing surgery? [facility]; DR. Alessandra [name]   6. What is your office phone number [phone]    7.   What is your office fax number [phone]  8.   Anesthesia type (None, local, MAC, general) ? PROPOFOL   Damon [name] 06/09/2019, 12:14 PM  _________________________________________________________________   (provider comments below)

## 2019-06-10 NOTE — Telephone Encounter (Signed)
   Primary Cardiologist: Lauree Chandler, MD  Chart reviewed as part of pre-operative protocol coverage. Patient was contacted 06/10/2019 in reference to pre-operative risk assessment for pending surgery as outlined below.  Damon [name] was last seen on 03/02/2019 by Dr. [name].  Since that day, Damon [name] has done well without chest pain or shortness of breath.  Therefore, based on ACC/AHA guidelines, the patient would be at acceptable risk for the planned procedure without further cardiovascular testing.   I will route this recommendation to the requesting party via Epic fax function and remove from pre-op pool.  Please call with questions. Per our clinical pharmacist, patient may hold Xarelto for 1 day prior to the procedure, he will need to restart Xarelto as soon as possible afterward at the discretion of the surgeon based on bleeding risk.  [place], [place] 06/10/2019, 9:02 AM

## 2019-06-24 LAB — HM COLONOSCOPY

## 2019-06-28 ENCOUNTER — Ambulatory Visit (HOSPITAL_COMMUNITY)
Admission: RE | Admit: 2019-06-28 | Discharge: 2019-06-28 | Disposition: A | Discharge status: Home / Self Care | Payer: Medicare Other | Source: Ambulatory Visit | Attending: Nurse Practitioner | Admitting: Nurse Practitioner

## 2019-06-28 ENCOUNTER — Encounter (HOSPITAL_COMMUNITY): Payer: Self-pay | Admitting: Nurse Practitioner

## 2019-06-28 ENCOUNTER — Other Ambulatory Visit: Payer: Self-pay

## 2019-06-28 VITALS — BP 118/70 | HR 87 | Ht 69.0 in | Wt 200.4 lb

## 2019-06-28 DIAGNOSIS — Z8249 Family history of ischemic heart disease and other diseases of the circulatory system: Secondary | ICD-10-CM | POA: Diagnosis not present

## 2019-06-28 DIAGNOSIS — Z79899 Other long term (current) drug therapy: Secondary | ICD-10-CM | POA: Diagnosis not present

## 2019-06-28 DIAGNOSIS — I4819 Other persistent atrial fibrillation: Secondary | ICD-10-CM | POA: Diagnosis not present

## 2019-06-28 DIAGNOSIS — R7303 Prediabetes: Secondary | ICD-10-CM | POA: Diagnosis not present

## 2019-06-28 DIAGNOSIS — I480 Paroxysmal atrial fibrillation: Secondary | ICD-10-CM | POA: Diagnosis present

## 2019-06-28 DIAGNOSIS — Z87891 Personal history of nicotine dependence: Secondary | ICD-10-CM | POA: Diagnosis not present

## 2019-06-28 DIAGNOSIS — Z8551 Personal history of malignant neoplasm of bladder: Secondary | ICD-10-CM | POA: Insufficient documentation

## 2019-06-28 DIAGNOSIS — I10 Essential (primary) hypertension: Secondary | ICD-10-CM | POA: Insufficient documentation

## 2019-06-28 DIAGNOSIS — E559 Vitamin D deficiency, unspecified: Secondary | ICD-10-CM | POA: Insufficient documentation

## 2019-06-28 DIAGNOSIS — G4733 Obstructive sleep apnea (adult) (pediatric): Secondary | ICD-10-CM | POA: Diagnosis not present

## 2019-06-28 DIAGNOSIS — E785 Hyperlipidemia, unspecified: Secondary | ICD-10-CM | POA: Insufficient documentation

## 2019-06-28 DIAGNOSIS — I739 Peripheral vascular disease, unspecified: Secondary | ICD-10-CM | POA: Insufficient documentation

## 2019-06-28 DIAGNOSIS — Z7901 Long term (current) use of anticoagulants: Secondary | ICD-10-CM | POA: Insufficient documentation

## 2019-06-28 DIAGNOSIS — D6869 Other thrombophilia: Secondary | ICD-10-CM

## 2019-06-28 LAB — CBC
HCT: 44.3 % (ref 39.0–52.0)
Hemoglobin: 14.9 g/dL (ref 13.0–17.0)
MCH: 30.2 pg (ref 26.0–34.0)
MCHC: 33.6 g/dL (ref 30.0–36.0)
MCV: 89.9 fL (ref 80.0–100.0)
Platelets: 205 10*3/uL (ref 150–400)
RBC: 4.93 MIL/uL (ref 4.22–5.81)
RDW: 12.4 % (ref 11.5–15.5)
WBC: 7.6 10*3/uL (ref 4.0–10.5)
nRBC: 0 % (ref 0.0–0.2)

## 2019-06-28 LAB — BASIC METABOLIC PANEL
Anion gap: 9 (ref 5–15)
BUN: 14 mg/dL (ref 8–23)
CO2: 25 mmol/L (ref 22–32)
Calcium: 9.1 mg/dL (ref 8.9–10.3)
Chloride: 109 mmol/L (ref 98–111)
Creatinine, Ser: 0.9 mg/dL (ref 0.61–1.24)
GFR calc Af Amer: >60 mL/min (ref >60)
GFR calc non Af Amer: >60 mL/min (ref >60)
Glucose, Bld: 92 mg/dL (ref 70–99)
Potassium: 3.7 mmol/L (ref 3.5–5.1)
Sodium: 143 mmol/L (ref 135–145)

## 2019-06-28 NOTE — Patient Instructions (Addendum)
Cardioversion scheduled for December 14th  - Arrive at the [facility] and go to admitting at 11:00am  -Do not eat or drink anything after midnight the night prior to your procedure.  - Take all your morning medications with a sip of water prior to arrival.  - You will not be able to drive home after your procedure.

## 2019-06-28 NOTE — Progress Notes (Signed)
Primary Care Physician: [name], MD Referring Physician: [name] is a 73 y.o. male with a h/o paroxysmal afib that is in the clinic for going back into afib sometime over the last couple of days.He had an colonoscopy last Friday which may have been  the trigger. He came off anticoagulation for 2 days prior. He is rate controled today.    Today, he denies symptoms of palpitations, chest pain, shortness of breath, orthopnea, PND, lower extremity edema, dizziness, presyncope, syncope, or neurologic sequela. The patient is tolerating medications without difficulties and is otherwise without complaint today.   Past Medical History:  Diagnosis Date  . Bladder cancer ([place])   . Hearing loss of both ears   . History of colon polyps    BENIGN  . Hyperlipidemia   . Hypertension   . Mitral regurgitation   . OSA on CPAP    cpap settign of 13  . PAD (peripheral artery disease) (HCC)    ABI--  RIGHT SIDE NORMAL LEFT SIDE MODERATELY REDUCED W/ DISTAL LEFT SFA STENOSIS//  MILD CALDICATION  . PAF (paroxysmal atrial fibrillation) ([place])     a. s/p PVI 2014; b. s/p redo PVI and CTI 01/2015  . Pre-diabetes   . Vitamin D deficiency    Past Surgical History:  Procedure Laterality Date  . ABLATION OF DYSRHYTHMIC FOCUS  01/26/2015  . ATRIAL FIBRILLATION ABLATION N/A 07/27/2012   Procedure: ATRIAL FIBRILLATION ABLATION;  Surgeon: [name], MD;  Location: [facility] CATH LAB;  Service: Cardiovascular;  Laterality: N/A;  . ATRIAL FIBRILLATION ABLATION N/A 08/25/2018   Procedure: ATRIAL FIBRILLATION ABLATION;  Surgeon: [name], MD;  Location: [place] CV LAB;  Service: Cardiovascular;  Laterality: N/A;  . CARDIAC ELECTROPHYSIOLOGY STUDY AND ABLATION  07-27-2012  DR ALLRED   SUCCESSFUL ABLATION OF A-FIB  . CARDIOVERSION  08/06/2012   Procedure: CARDIOVERSION;  Surgeon: [name], MD;  Location: [facility] ENDOSCOPY;  Service: Cardiovascular;  Laterality: N/A;  . CARDIOVERSION N/A  08/25/2014   Procedure: CARDIOVERSION;  Surgeon: Larey Dresser, MD;  Location: [place];  Service: Cardiovascular;  Laterality: N/A;  . CARDIOVERSION N/A 11/13/2014   Procedure: CARDIOVERSION;  Surgeon: Sueanne Margarita, MD;  Location: [facility] ENDOSCOPY;  Service: Cardiovascular;  Laterality: N/A;  . CARDIOVERSION N/A 12/28/2014   Procedure: CARDIOVERSION;  Surgeon: [name], MD;  Location: [place];  Service: Cardiovascular;  Laterality: N/A;  . CARDIOVERSION N/A 01/30/2017   Procedure: CARDIOVERSION;  Surgeon: Josue Hector, MD;  Location: [facility] ENDOSCOPY;  Service: Cardiovascular;  Laterality: N/A;  . CARDIOVERSION N/A 04/06/2017   Procedure: CARDIOVERSION;  Surgeon: Dorothy Spark, MD;  Location: [facility] ENDOSCOPY;  Service: Cardiovascular;  Laterality: N/A;  . CARDIOVERSION N/A 05/25/2018   Procedure: CARDIOVERSION;  Surgeon: Skeet Latch, MD;  Location: [facility] ENDOSCOPY;  Service: Cardiovascular;  Laterality: N/A;  . CARDIOVERSION N/A 08/09/2018   Procedure: CARDIOVERSION;  Surgeon: Buford Dresser, MD;  Location: [facility] ENDOSCOPY;  Service: Cardiovascular;  Laterality: N/A;  . CATARACT EXTRACTION W/ INTRAOCULAR LENS  IMPLANT, BILATERAL    . COLONOSCOPY WITH PROPOFOL N/A 02/13/2015   Procedure: COLONOSCOPY WITH PROPOFOL;  Surgeon: Garlan Fair, MD;  Location: [facility] ENDOSCOPY;  Service: Endoscopy;  Laterality: N/A;  . CYSTOSCOPY W/ RETROGRADES Bilateral 04/12/2013   Procedure: CYSTOSCOPY WITH RETROGRADE PYELOGRAM;  Surgeon: [name], MD;  Location: [facility];  Service: Urology;  Laterality: Bilateral;  . ELECTROPHYSIOLOGIC STUDY N/A 01/26/2015   Procedure: Atrial Fibrillation Ablation;  Surgeon: [name], MD;  Location: [place] CV LAB;  Service: Cardiovascular;  Laterality: N/A;  . LEFT HEART CATHETERIZATION WITH CORONARY ANGIOGRAM N/A 11/24/2013   Procedure: LEFT HEART CATHETERIZATION WITH CORONARY ANGIOGRAM;  Surgeon: [name], MD;  Location: [facility]  CATH LAB;  Service: Cardiovascular;  Laterality: N/A;  . PERCUTANEOUS CORONARY ROTOBLATOR INTERVENTION (PCI-R) N/A 11/28/2013   Procedure: PERCUTANEOUS CORONARY ROTOBLATOR INTERVENTION (PCI-R);  Surgeon: [name], MD;  Location: [facility] CATH LAB;  Service: Cardiovascular;  Laterality: N/A;  . PERCUTANEOUS CORONARY STENT INTERVENTION (PCI-S)  11/24/2013   Procedure: PERCUTANEOUS CORONARY STENT INTERVENTION (PCI-S);  Surgeon: [name], MD;  Location: [facility] CATH LAB;  Service: Cardiovascular;;  . TEE WITHOUT CARDIOVERSION  07/26/2012   Procedure: TRANSESOPHAGEAL ECHOCARDIOGRAM (TEE);  Surgeon: [name], MD;  Location: [facility] ENDOSCOPY;  Service: Cardiovascular;  Laterality: N/A;  . TEE WITHOUT CARDIOVERSION N/A 08/25/2014   Procedure: TRANSESOPHAGEAL ECHOCARDIOGRAM (TEE);  Surgeon: Larey Dresser, MD;  Location: [place];  Service: Cardiovascular;  Laterality: N/A;  . TEE WITHOUT CARDIOVERSION  01/26/2015   Procedure: Transesophageal Echocardiogram (Tee);  Surgeon: [name], MD;  Location: [place] CV LAB;  Service: Cardiovascular;;  . TONSILLECTOMY  age 30  . TOTAL HIP ARTHROPLASTY  07/25/2011   Procedure: TOTAL HIP ARTHROPLASTY ANTERIOR APPROACH;  Surgeon: Mcarthur Rossetti;  Location: [facility];  Service: Orthopedics;  Laterality: Right;  . TOTAL HIP ARTHROPLASTY Left 02-08-2010  . TRANSURETHRAL RESECTION OF BLADDER TUMOR WITH GYRUS (TURBT-GYRUS) N/A 04/12/2013   Procedure: TRANSURETHRAL RESECTION OF BLADDER TUMOR WITH GYRUS (TURBT-GYRUS);  Surgeon: [name], MD;  Location: [facility];  Service: Urology;  Laterality: N/A;    Current Outpatient Medications  Medication Sig Dispense Refill  . ALPRAZolam (XANAX) 1 MG tablet Take 1/2-1 tablet  At Bedtime  ONLY if needed for Sleep &  limit to 5 days /week to avoid addiction 30 tablet 0  . bisoprolol-hydrochlorothiazide (ZIAC) 10-6.25 MG tablet Take 1 tablet Daily for BP 90 tablet 3  . Cholecalciferol  (VITAMIN D3) 125 MCG (5000 UT) CAPS Take 10,000 units Daily    . diltiazem (CARTIA XT) 180 MG 24 hr capsule Take 1 capsule by mouth once daily 90 capsule 2  . ezetimibe (ZETIA) 10 MG tablet Take 1 tablet Daily for Cholesterol 90 tablet 3  . gabapentin (NEURONTIN) 300 MG capsule 1-3 at night for pain 90 capsule 2  . Multiple Vitamin (MULTIVITAMIN WITH MINERALS) TABS tablet Take 1 tablet Daily    . NITROSTAT 0.4 MG SL tablet DISSOLVE ONE TABLET UNDER THE TONGUE EVERY 5 MINUTES AS NEEDED FOR CHEST PAIN.  DO NOT EXCEED A TOTAL OF 3 DOSES IN 15 MINUTES 25 tablet 6  . Polyvinyl Alcohol-Povidone (REFRESH OP) Place 1 drop into both eyes daily as needed (dry eyes).    . rosuvastatin (CRESTOR) 40 MG tablet Take 1 tablet Daily for Cholesterol 90 tablet 3  . valACYclovir (VALTREX) 1000 MG tablet Take 1 tablet (1,000 mg total) by mouth 3 (three) times daily. Take for 7 days 21 tablet 0  . XARELTO 20 MG TABS tablet TAKE 1 TABLET BY MOUTH ONCE DAILY WITH SUPPER 90 tablet 2   No current facility-administered medications for this encounter.     No Known Allergies  Social History   Socioeconomic History  . Marital status: Married    Spouse name: Not on file  . Number of children: Not on file  . Years of education: Not on file  . Highest  education level: Not on file  Occupational History  . Not on file  Social Needs  . Financial resource strain: Not on file  . Food insecurity    Worry: Not on file    Inability: Not on file  . Transportation needs    Medical: Not on file    Non-medical: Not on file  Tobacco Use  . Smoking status: Former Smoker    Packs/day: 2.00    Years: 10.00    Pack years: 20.00    Types: Cigarettes    Quit date: 09/20/1981    Years since quitting: 37.7  . Smokeless tobacco: Never Used  Substance and Sexual Activity  . Alcohol use: No    Comment: pt states he has stopped drinking alcohol  . Drug use: No  . Sexual activity: Not on file  Lifestyle  . Physical activity     Days per week: Not on file    Minutes per session: Not on file  . Stress: Not on file  Relationships  . Social Herbalist on phone: Not on file    Gets together: Not on file    Attends religious service: Not on file    Active member of club or organization: Not on file    Attends meetings of clubs or organizations: Not on file    Relationship status: Not on file  . Intimate partner violence    Fear of current or ex partner: Not on file    Emotionally abused: Not on file    Physically abused: Not on file    Forced sexual activity: Not on file  Other Topics Concern  . Not on file  Social History Narrative  . Not on file    Family History  Problem Relation Age of Onset  . Heart attack Mother   . Heart disease Mother   . Heart failure Father   . Heart disease Father     ROS- All systems are reviewed and negative except as per the HPI above  Physical Exam: Vitals:   06/28/19 1511  BP: 118/70  Pulse: 87  Weight: 90.9 kg  Height: 5\' 9"  (1.753 m)   Wt Readings from Last 3 Encounters:  06/28/19 90.9 kg  04/14/19 91.5 kg  03/02/19 89.8 kg    Labs: Lab Results  Component Value Date   NA 142 04/14/2019   K 3.8 04/14/2019   CL 107 04/14/2019   CO2 26 04/14/2019   GLUCOSE 96 04/14/2019   BUN 16 04/14/2019   CREATININE 0.87 04/14/2019   CALCIUM 9.5 04/14/2019   MG 2.0 04/14/2019   Lab Results  Component Value Date   INR 1.1 (H) 11/16/2013   Lab Results  Component Value Date   CHOL 137 04/14/2019   HDL 51 04/14/2019   LDLCALC 67 04/14/2019   TRIG 100 04/14/2019     GEN- The patient is well appearing, alert and oriented x 3 today.   Head- normocephalic, atraumatic Eyes-  Sclera clear, conjunctiva pink Ears- hearing intact Oropharynx- clear Neck- supple, no JVP Lymph- no cervical lymphadenopathy Lungs- Clear to ausculation bilaterally, normal work of breathing Heart- irregular rate and rhythm, no murmurs, rubs or gallops, PMI not laterally  displaced GI- soft, NT, ND, + BS Extremities- no clubbing, cyanosis, or edema MS- no significant deformity or atrophy Skin- no rash or lesion Psych- euthymic mood, full affect Neuro- strength and sensation are intact  EKG-afib at 87 bpm, qrs int 88 ms, qtc 462 ms  Assessment and Plan: 1. Paroxysmal afib  Pt is in rate controlled afib over the last couple of days Colonoscopy was last Friday which may have been the trigger  He wants a cardioversion right away and will need a TEE guided as he had missed two  doses of xarelto for colonoscopy Bmet/cbc today  2. CHA2DS2VASc score of 3 Continue  xarelto 20 mg daily  Missed doses last Wednesday and Thursday   F/u one week after cardioversion  Butch Penny C. Lorriann Hansmann, [facility] [address] [place], [place] [address] [phone]

## 2019-06-28 NOTE — H&P (View-Only) (Signed)
Primary Care Physician: [name], MD Referring Physician: [name] is a 73 y.o. male with a h/o paroxysmal afib that is in the clinic for going back into afib sometime over the last couple of days.He had an colonoscopy last Friday which may have been  the trigger. He came off anticoagulation for 2 days prior. He is rate controled today.    Today, he denies symptoms of palpitations, chest pain, shortness of breath, orthopnea, PND, lower extremity edema, dizziness, presyncope, syncope, or neurologic sequela. The patient is tolerating medications without difficulties and is otherwise without complaint today.   Past Medical History:  Diagnosis Date  . Bladder cancer ([place])   . Hearing loss of both ears   . History of colon polyps    BENIGN  . Hyperlipidemia   . Hypertension   . Mitral regurgitation   . OSA on CPAP    cpap settign of 13  . PAD (peripheral artery disease) (HCC)    ABI--  RIGHT SIDE NORMAL LEFT SIDE MODERATELY REDUCED W/ DISTAL LEFT SFA STENOSIS//  MILD CALDICATION  . PAF (paroxysmal atrial fibrillation) ([place])     a. s/p PVI 2014; b. s/p redo PVI and CTI 01/2015  . Pre-diabetes   . Vitamin D deficiency    Past Surgical History:  Procedure Laterality Date  . ABLATION OF DYSRHYTHMIC FOCUS  01/26/2015  . ATRIAL FIBRILLATION ABLATION N/A 07/27/2012   Procedure: ATRIAL FIBRILLATION ABLATION;  Surgeon: [name], MD;  Location: [facility] CATH LAB;  Service: Cardiovascular;  Laterality: N/A;  . ATRIAL FIBRILLATION ABLATION N/A 08/25/2018   Procedure: ATRIAL FIBRILLATION ABLATION;  Surgeon: [name], MD;  Location: [place] CV LAB;  Service: Cardiovascular;  Laterality: N/A;  . CARDIAC ELECTROPHYSIOLOGY STUDY AND ABLATION  07-27-2012  DR ALLRED   SUCCESSFUL ABLATION OF A-FIB  . CARDIOVERSION  08/06/2012   Procedure: CARDIOVERSION;  Surgeon: [name], MD;  Location: [facility] ENDOSCOPY;  Service: Cardiovascular;  Laterality: N/A;  . CARDIOVERSION N/A  08/25/2014   Procedure: CARDIOVERSION;  Surgeon: Larey Dresser, MD;  Location: [place];  Service: Cardiovascular;  Laterality: N/A;  . CARDIOVERSION N/A 11/13/2014   Procedure: CARDIOVERSION;  Surgeon: Sueanne Margarita, MD;  Location: [facility] ENDOSCOPY;  Service: Cardiovascular;  Laterality: N/A;  . CARDIOVERSION N/A 12/28/2014   Procedure: CARDIOVERSION;  Surgeon: [name], MD;  Location: [place];  Service: Cardiovascular;  Laterality: N/A;  . CARDIOVERSION N/A 01/30/2017   Procedure: CARDIOVERSION;  Surgeon: Josue Hector, MD;  Location: [facility] ENDOSCOPY;  Service: Cardiovascular;  Laterality: N/A;  . CARDIOVERSION N/A 04/06/2017   Procedure: CARDIOVERSION;  Surgeon: Dorothy Spark, MD;  Location: [facility] ENDOSCOPY;  Service: Cardiovascular;  Laterality: N/A;  . CARDIOVERSION N/A 05/25/2018   Procedure: CARDIOVERSION;  Surgeon: Skeet Latch, MD;  Location: [facility] ENDOSCOPY;  Service: Cardiovascular;  Laterality: N/A;  . CARDIOVERSION N/A 08/09/2018   Procedure: CARDIOVERSION;  Surgeon: Buford Dresser, MD;  Location: [facility] ENDOSCOPY;  Service: Cardiovascular;  Laterality: N/A;  . CATARACT EXTRACTION W/ INTRAOCULAR LENS  IMPLANT, BILATERAL    . COLONOSCOPY WITH PROPOFOL N/A 02/13/2015   Procedure: COLONOSCOPY WITH PROPOFOL;  Surgeon: Garlan Fair, MD;  Location: [facility] ENDOSCOPY;  Service: Endoscopy;  Laterality: N/A;  . CYSTOSCOPY W/ RETROGRADES Bilateral 04/12/2013   Procedure: CYSTOSCOPY WITH RETROGRADE PYELOGRAM;  Surgeon: [name], MD;  Location: [facility];  Service: Urology;  Laterality: Bilateral;  . ELECTROPHYSIOLOGIC STUDY N/A 01/26/2015   Procedure: Atrial Fibrillation Ablation;  Surgeon: [name], MD;  Location: [place] CV LAB;  Service: Cardiovascular;  Laterality: N/A;  . LEFT HEART CATHETERIZATION WITH CORONARY ANGIOGRAM N/A 11/24/2013   Procedure: LEFT HEART CATHETERIZATION WITH CORONARY ANGIOGRAM;  Surgeon: [name], MD;  Location: [facility]  CATH LAB;  Service: Cardiovascular;  Laterality: N/A;  . PERCUTANEOUS CORONARY ROTOBLATOR INTERVENTION (PCI-R) N/A 11/28/2013   Procedure: PERCUTANEOUS CORONARY ROTOBLATOR INTERVENTION (PCI-R);  Surgeon: [name], MD;  Location: [facility] CATH LAB;  Service: Cardiovascular;  Laterality: N/A;  . PERCUTANEOUS CORONARY STENT INTERVENTION (PCI-S)  11/24/2013   Procedure: PERCUTANEOUS CORONARY STENT INTERVENTION (PCI-S);  Surgeon: [name], MD;  Location: [facility] CATH LAB;  Service: Cardiovascular;;  . TEE WITHOUT CARDIOVERSION  07/26/2012   Procedure: TRANSESOPHAGEAL ECHOCARDIOGRAM (TEE);  Surgeon: [name], MD;  Location: [facility] ENDOSCOPY;  Service: Cardiovascular;  Laterality: N/A;  . TEE WITHOUT CARDIOVERSION N/A 08/25/2014   Procedure: TRANSESOPHAGEAL ECHOCARDIOGRAM (TEE);  Surgeon: Larey Dresser, MD;  Location: [place];  Service: Cardiovascular;  Laterality: N/A;  . TEE WITHOUT CARDIOVERSION  01/26/2015   Procedure: Transesophageal Echocardiogram (Tee);  Surgeon: [name], MD;  Location: [place] CV LAB;  Service: Cardiovascular;;  . TONSILLECTOMY  age 89  . TOTAL HIP ARTHROPLASTY  07/25/2011   Procedure: TOTAL HIP ARTHROPLASTY ANTERIOR APPROACH;  Surgeon: Mcarthur Rossetti;  Location: [facility] ORS;  Service: Orthopedics;  Laterality: Right;  . TOTAL HIP ARTHROPLASTY Left 02-08-2010  . TRANSURETHRAL RESECTION OF BLADDER TUMOR WITH GYRUS (TURBT-GYRUS) N/A 04/12/2013   Procedure: TRANSURETHRAL RESECTION OF BLADDER TUMOR WITH GYRUS (TURBT-GYRUS);  Surgeon: [name], MD;  Location: [facility];  Service: Urology;  Laterality: N/A;    Current Outpatient Medications  Medication Sig Dispense Refill  . ALPRAZolam (XANAX) 1 MG tablet Take 1/2-1 tablet  At Bedtime  ONLY if needed for Sleep &  limit to 5 days /week to avoid addiction 30 tablet 0  . bisoprolol-hydrochlorothiazide (ZIAC) 10-6.25 MG tablet Take 1 tablet Daily for BP 90 tablet 3  . Cholecalciferol  (VITAMIN D3) 125 MCG (5000 UT) CAPS Take 10,000 units Daily    . diltiazem (CARTIA XT) 180 MG 24 hr capsule Take 1 capsule by mouth once daily 90 capsule 2  . ezetimibe (ZETIA) 10 MG tablet Take 1 tablet Daily for Cholesterol 90 tablet 3  . gabapentin (NEURONTIN) 300 MG capsule 1-3 at night for pain 90 capsule 2  . Multiple Vitamin (MULTIVITAMIN WITH MINERALS) TABS tablet Take 1 tablet Daily    . NITROSTAT 0.4 MG SL tablet DISSOLVE ONE TABLET UNDER THE TONGUE EVERY 5 MINUTES AS NEEDED FOR CHEST PAIN.  DO NOT EXCEED A TOTAL OF 3 DOSES IN 15 MINUTES 25 tablet 6  . Polyvinyl Alcohol-Povidone (REFRESH OP) Place 1 drop into both eyes daily as needed (dry eyes).    . rosuvastatin (CRESTOR) 40 MG tablet Take 1 tablet Daily for Cholesterol 90 tablet 3  . valACYclovir (VALTREX) 1000 MG tablet Take 1 tablet (1,000 mg total) by mouth 3 (three) times daily. Take for 7 days 21 tablet 0  . XARELTO 20 MG TABS tablet TAKE 1 TABLET BY MOUTH ONCE DAILY WITH SUPPER 90 tablet 2   No current facility-administered medications for this encounter.     No Known Allergies  Social History   Socioeconomic History  . Marital status: Married    Spouse name: Not on file  . Number of children: Not on file  . Years of education: Not on file  . Highest  education level: Not on file  Occupational History  . Not on file  Social Needs  . Financial resource strain: Not on file  . Food insecurity    Worry: Not on file    Inability: Not on file  . Transportation needs    Medical: Not on file    Non-medical: Not on file  Tobacco Use  . Smoking status: Former Smoker    Packs/day: 2.00    Years: 10.00    Pack years: 20.00    Types: Cigarettes    Quit date: 09/20/1981    Years since quitting: 37.7  . Smokeless tobacco: Never Used  Substance and Sexual Activity  . Alcohol use: No    Comment: pt states he has stopped drinking alcohol  . Drug use: No  . Sexual activity: Not on file  Lifestyle  . Physical activity     Days per week: Not on file    Minutes per session: Not on file  . Stress: Not on file  Relationships  . Social Herbalist on phone: Not on file    Gets together: Not on file    Attends religious service: Not on file    Active member of club or organization: Not on file    Attends meetings of clubs or organizations: Not on file    Relationship status: Not on file  . Intimate partner violence    Fear of current or ex partner: Not on file    Emotionally abused: Not on file    Physically abused: Not on file    Forced sexual activity: Not on file  Other Topics Concern  . Not on file  Social History Narrative  . Not on file    Family History  Problem Relation Age of Onset  . Heart attack Mother   . Heart disease Mother   . Heart failure Father   . Heart disease Father     ROS- All systems are reviewed and negative except as per the HPI above  Physical Exam: Vitals:   06/28/19 1511  BP: 118/70  Pulse: 87  Weight: 90.9 kg  Height: 5\' 9"  (1.753 m)   Wt Readings from Last 3 Encounters:  06/28/19 90.9 kg  04/14/19 91.5 kg  03/02/19 89.8 kg    Labs: Lab Results  Component Value Date   NA 142 04/14/2019   K 3.8 04/14/2019   CL 107 04/14/2019   CO2 26 04/14/2019   GLUCOSE 96 04/14/2019   BUN 16 04/14/2019   CREATININE 0.87 04/14/2019   CALCIUM 9.5 04/14/2019   MG 2.0 04/14/2019   Lab Results  Component Value Date   INR 1.1 (H) 11/16/2013   Lab Results  Component Value Date   CHOL 137 04/14/2019   HDL 51 04/14/2019   LDLCALC 67 04/14/2019   TRIG 100 04/14/2019     GEN- The patient is well appearing, alert and oriented x 3 today.   Head- normocephalic, atraumatic Eyes-  Sclera clear, conjunctiva pink Ears- hearing intact Oropharynx- clear Neck- supple, no JVP Lymph- no cervical lymphadenopathy Lungs- Clear to ausculation bilaterally, normal work of breathing Heart- irregular rate and rhythm, no murmurs, rubs or gallops, PMI not laterally  displaced GI- soft, NT, ND, + BS Extremities- no clubbing, cyanosis, or edema MS- no significant deformity or atrophy Skin- no rash or lesion Psych- euthymic mood, full affect Neuro- strength and sensation are intact  EKG-afib at 87 bpm, qrs int 88 ms, qtc 462 ms  Assessment and Plan: 1. Paroxysmal afib  Pt is in rate controlled afib over the last couple of days Colonoscopy was last Friday which may have been the trigger  He wants a cardioversion right away and will need a TEE guided as he had missed two  doses of xarelto for colonoscopy Bmet/cbc today  2. CHA2DS2VASc score of 3 Continue  xarelto 20 mg daily  Missed doses last Wednesday and Thursday   F/u one week after cardioversion  Butch Penny C. Ramelo Oetken, [facility] [address] [place], [place] [address] [phone]

## 2019-06-30 ENCOUNTER — Other Ambulatory Visit (HOSPITAL_COMMUNITY)
Admission: RE | Admit: 2019-06-30 | Discharge: 2019-06-30 | Disposition: A | Discharge status: Home / Self Care | Payer: Medicare Other | Source: Ambulatory Visit | Attending: Cardiology | Admitting: Cardiology

## 2019-06-30 DIAGNOSIS — Z01812 Encounter for preprocedural laboratory examination: Secondary | ICD-10-CM | POA: Diagnosis present

## 2019-06-30 DIAGNOSIS — Z20828 Contact with and (suspected) exposure to other viral communicable diseases: Secondary | ICD-10-CM | POA: Diagnosis not present

## 2019-07-01 LAB — NOVEL CORONAVIRUS, NAA (HOSP ORDER, SEND-OUT TO REF LAB; TAT 18-24 HRS): SARS-CoV-2, NAA: NOT DETECTED

## 2019-07-04 ENCOUNTER — Ambulatory Visit (HOSPITAL_COMMUNITY): Payer: Medicare Other | Admitting: Anesthesiology

## 2019-07-04 ENCOUNTER — Ambulatory Visit (HOSPITAL_BASED_OUTPATIENT_CLINIC_OR_DEPARTMENT_OTHER)
Admission: RE | Admit: 2019-07-04 | Discharge: 2019-07-04 | Disposition: A | Discharge status: Home / Self Care | Payer: Medicare Other | Source: Ambulatory Visit | Attending: Nurse Practitioner | Admitting: Nurse Practitioner

## 2019-07-04 ENCOUNTER — Ambulatory Visit (HOSPITAL_COMMUNITY)
Admission: RE | Admit: 2019-07-04 | Discharge: 2019-07-04 | Disposition: A | Discharge status: Home / Self Care | Payer: Medicare Other | Attending: Cardiology | Admitting: Cardiology

## 2019-07-04 ENCOUNTER — Encounter (HOSPITAL_COMMUNITY): Payer: Self-pay | Admitting: Cardiology

## 2019-07-04 ENCOUNTER — Other Ambulatory Visit: Payer: Self-pay

## 2019-07-04 ENCOUNTER — Encounter (HOSPITAL_COMMUNITY)
Admission: RE | Disposition: A | Discharge status: Home / Self Care | Payer: Self-pay | Source: Home / Self Care | Attending: Cardiology

## 2019-07-04 DIAGNOSIS — G4733 Obstructive sleep apnea (adult) (pediatric): Secondary | ICD-10-CM | POA: Insufficient documentation

## 2019-07-04 DIAGNOSIS — I480 Paroxysmal atrial fibrillation: Secondary | ICD-10-CM | POA: Insufficient documentation

## 2019-07-04 DIAGNOSIS — Z955 Presence of coronary angioplasty implant and graft: Secondary | ICD-10-CM | POA: Insufficient documentation

## 2019-07-04 DIAGNOSIS — E782 Mixed hyperlipidemia: Secondary | ICD-10-CM

## 2019-07-04 DIAGNOSIS — E559 Vitamin D deficiency, unspecified: Secondary | ICD-10-CM

## 2019-07-04 DIAGNOSIS — I4891 Unspecified atrial fibrillation: Secondary | ICD-10-CM | POA: Diagnosis not present

## 2019-07-04 DIAGNOSIS — Z87891 Personal history of nicotine dependence: Secondary | ICD-10-CM | POA: Diagnosis not present

## 2019-07-04 DIAGNOSIS — I10 Essential (primary) hypertension: Secondary | ICD-10-CM | POA: Diagnosis not present

## 2019-07-04 DIAGNOSIS — Z79899 Other long term (current) drug therapy: Secondary | ICD-10-CM | POA: Diagnosis not present

## 2019-07-04 DIAGNOSIS — I081 Rheumatic disorders of both mitral and tricuspid valves: Secondary | ICD-10-CM | POA: Diagnosis not present

## 2019-07-04 DIAGNOSIS — Z8719 Personal history of other diseases of the digestive system: Secondary | ICD-10-CM | POA: Diagnosis not present

## 2019-07-04 DIAGNOSIS — I739 Peripheral vascular disease, unspecified: Secondary | ICD-10-CM | POA: Insufficient documentation

## 2019-07-04 DIAGNOSIS — Z7901 Long term (current) use of anticoagulants: Secondary | ICD-10-CM | POA: Diagnosis not present

## 2019-07-04 DIAGNOSIS — E785 Hyperlipidemia, unspecified: Secondary | ICD-10-CM | POA: Insufficient documentation

## 2019-07-04 DIAGNOSIS — Z8551 Personal history of malignant neoplasm of bladder: Secondary | ICD-10-CM | POA: Insufficient documentation

## 2019-07-04 DIAGNOSIS — B029 Zoster without complications: Secondary | ICD-10-CM

## 2019-07-04 DIAGNOSIS — I340 Nonrheumatic mitral (valve) insufficiency: Secondary | ICD-10-CM

## 2019-07-04 HISTORY — PX: CARDIOVERSION: SHX1299

## 2019-07-04 HISTORY — PX: TEE WITHOUT CARDIOVERSION: SHX5443

## 2019-07-04 SURGERY — ECHOCARDIOGRAM, TRANSESOPHAGEAL
Anesthesia: Monitor Anesthesia Care

## 2019-07-04 MED ORDER — PROPOFOL 500 MG/50ML IV EMUL
INTRAVENOUS | Status: DC | PRN
  Administered 2019-07-04: 75 ug/kg/min via INTRAVENOUS

## 2019-07-04 MED ORDER — NITROGLYCERIN 0.4 MG SL SUBL: SUBLINGUAL_TABLET | SUBLINGUAL | 6 refills | Status: DC

## 2019-07-04 MED ORDER — VITAMIN D3 125 MCG (5000 UT) PO CAPS: ORAL_CAPSULE | ORAL | Status: DC

## 2019-07-04 MED ORDER — ROSUVASTATIN CALCIUM 40 MG PO TABS: ORAL_TABLET | ORAL | 3 refills | Status: DC

## 2019-07-04 MED ORDER — RIVAROXABAN 20 MG PO TABS: ORAL_TABLET | ORAL | 2 refills | Status: DC

## 2019-07-04 MED ORDER — DILTIAZEM HCL ER COATED BEADS 180 MG PO CP24: ORAL_CAPSULE | ORAL | 2 refills | Status: DC

## 2019-07-04 MED ORDER — VALACYCLOVIR HCL 1 G PO TABS: 1000.0000 mg | ORAL_TABLET | Freq: Three times a day (TID) | ORAL | 0 refills | Status: DC

## 2019-07-04 MED ORDER — SODIUM CHLORIDE 0.9 % IV SOLN
INTRAVENOUS | Status: DC
  Administered 2019-07-04: 12:00:00 via INTRAVENOUS

## 2019-07-04 MED ORDER — PROPOFOL 10 MG/ML IV BOLUS
INTRAVENOUS | Status: DC | PRN
  Administered 2019-07-04: 30 mg via INTRAVENOUS
  Administered 2019-07-04: 20 mg via INTRAVENOUS

## 2019-07-04 MED ORDER — PHENYLEPHRINE 40 MCG/ML (10ML) SYRINGE FOR IV PUSH (FOR BLOOD PRESSURE SUPPORT)
PREFILLED_SYRINGE | INTRAVENOUS | Status: DC | PRN
  Administered 2019-07-04 (×2): 80 ug via INTRAVENOUS

## 2019-07-04 MED ORDER — EZETIMIBE 10 MG PO TABS: ORAL_TABLET | ORAL | 3 refills | Status: DC

## 2019-07-04 MED ORDER — BISOPROLOL-HYDROCHLOROTHIAZIDE 10-6.25 MG PO TABS: ORAL_TABLET | ORAL | 3 refills | Status: DC

## 2019-07-04 NOTE — Transfer of Care (Signed)
Immediate Anesthesia Transfer of Care Note  Patient: Damon [name]  Procedure(s) Performed: TRANSESOPHAGEAL ECHOCARDIOGRAM (TEE) (N/A ) CARDIOVERSION (N/A )  Patient Location: Endoscopy Unit  Anesthesia Type:MAC  Level of Consciousness: drowsy and patient cooperative  Airway & Oxygen Therapy: Patient Spontanous Breathing  Post-op Assessment: Report given to RN, Post -op Vital signs reviewed and stable and Patient moving all extremities X 4  Post vital signs: Reviewed and stable  Last Vitals:  Vitals Value Taken Time  BP    Temp    Pulse    Resp    SpO2      Last Pain:  Vitals:   07/04/19 1128  TempSrc: Temporal  PainSc: 0-No pain         Complications: No apparent anesthesia complications

## 2019-07-04 NOTE — Discharge Instructions (Signed)
TEE  YOU HAD AN CARDIAC PROCEDURE TODAY: Refer to the procedure report and other information in the discharge instructions given to you for any specific questions about what was found during the examination. If this information does not answer your questions, please call [facility] office at [phone] to clarify.   DIET: Your first meal following the procedure should be a light meal and then it is ok to progress to your normal diet. A half-sandwich or bowl of soup is an example of a good first meal. Heavy or fried foods are harder to digest and may make you feel nauseous or bloated. Drink plenty of fluids but you should avoid alcoholic beverages for 24 hours. If you had a esophageal dilation, please see attached instructions for diet.   ACTIVITY: Your care partner should take you home directly after the procedure. You should plan to take it easy, moving slowly for the rest of the day. You can resume normal activity the day after the procedure however YOU SHOULD NOT DRIVE, use power tools, machinery or perform tasks that involve climbing or major physical exertion for 24 hours (because of the sedation medicines used during the test).   SYMPTOMS TO REPORT IMMEDIATELY: A cardiologist can be reached at any hour. Please call [phone] for any of the following symptoms:  Vomiting of blood or coffee ground material  New, significant abdominal pain  New, significant chest pain or pain under the shoulder blades  Painful or persistently difficult swallowing  New shortness of breath  Black, tarry-looking or red, bloody stools  FOLLOW UP:  Please also call with any specific questions about appointments or follow up tests.     Electrical Cardioversion, Care After This sheet gives you information about how to care for yourself after your procedure. Your health care provider may also give you more specific instructions. If you have problems or questions, contact your health care provider. What  can I expect after the procedure? After the procedure, it is common to have:  Some redness on the skin where the shocks were given. Follow these instructions at home:   Do not drive for 24 hours if you were given a medicine to help you relax (sedative).  Take over-the-counter and prescription medicines only as told by your health care provider.  Ask your health care provider how to check your pulse. Check it often.  Rest for 48 hours after the procedure or as told by your health care provider.  Avoid or limit your caffeine use as told by your health care provider. Contact a health care provider if:  You feel like your heart is beating too quickly or your pulse is not regular.  You have a serious muscle cramp that does not go away. Get help right away if:   You have discomfort in your chest.  You are dizzy or you feel faint.  You have trouble breathing or you are short of breath.  Your speech is slurred.  You have trouble moving an arm or leg on one side of your body.  Your fingers or toes turn cold or blue. This information is not intended to replace advice given to you by your health care provider. Make sure you discuss any questions you have with your health care provider. Document Released: 04/27/2013 Document Revised: 06/19/2017 Document Reviewed: 01/11/2016 Elsevier Patient Education  2020 [employer].

## 2019-07-04 NOTE — Progress Notes (Signed)
  Echocardiogram Echocardiogram Transesophageal has been performed.  Burnett Kanaris 07/04/2019, 12:33 PM

## 2019-07-04 NOTE — Anesthesia Preprocedure Evaluation (Addendum)
Anesthesia Evaluation  Patient identified by MRN, date of birth, ID band Patient awake    Reviewed: Allergy & Precautions, NPO status , Patient's Chart, lab work & pertinent test results  History of Anesthesia Complications Negative for: history of anesthetic complications  Airway Mallampati: II   Neck ROM: Full    Dental no notable dental hx. (+) Dental Advisory Given   Pulmonary sleep apnea , former smoker,    Pulmonary exam normal        Cardiovascular hypertension, + CAD  + dysrhythmias Atrial Fibrillation  Rhythm:Irregular Rate:Normal  Echo 07/06/18 Left ventricle: The cavity size was normal. Wall thickness was   normal. Systolic function was normal. The estimated ejection   fraction was in the range of 60% to 65%. Wall motion was normal;   there were no regional wall motion abnormalities. Left   ventricular diastolic function parameters were normal for the   patient&'s age. - Left atrium: The atrium was mildly dilated. - Right atrium: The atrium was mildly dilated.   Neuro/Psych Anxiety negative neurological ROS  negative psych ROS   GI/Hepatic Neg liver ROS, GERD  ,  Endo/Other  negative endocrine ROS  Renal/GU negative Renal ROS     Musculoskeletal negative musculoskeletal ROS (+)   Abdominal (+) + obese,   Peds  Hematology negative hematology ROS (+)   Anesthesia Other Findings   Reproductive/Obstetrics                            Lab Results  Component Value Date   WBC 7.6 06/28/2019   HGB 14.9 06/28/2019   HCT 44.3 06/28/2019   MCV 89.9 06/28/2019   PLT 205 06/28/2019    Anesthesia Physical  Anesthesia Plan  ASA: III  Anesthesia Plan: MAC   Post-op Pain Management:    Induction: Intravenous  PONV Risk Score and Plan: 2 and Treatment may vary due to age or medical condition, Propofol infusion and Ondansetron  Airway Management Planned: Natural  Airway  Additional Equipment:   Intra-op Plan:   Post-operative Plan:   Informed Consent: I have reviewed the patients History and Physical, chart, labs and discussed the procedure including the risks, benefits and alternatives for the proposed anesthesia with the patient or authorized representative who has indicated his/her understanding and acceptance.     Dental advisory given  Plan Discussed with: CRNA and Anesthesiologist  Anesthesia Plan Comments:       Anesthesia Quick Evaluation

## 2019-07-04 NOTE — Interval H&P Note (Signed)
History and Physical Interval Note:  07/04/2019 11:03 AM  Damon [name]  has presented today for surgery, with the diagnosis of AFIB.  The various methods of treatment have been discussed with the patient and family. After consideration of risks, benefits and other options for treatment, the patient has consented to  Procedure(s): TRANSESOPHAGEAL ECHOCARDIOGRAM (TEE) (N/A) CARDIOVERSION (N/A) as a surgical intervention.  The patient's history has been reviewed, patient examined, no change in status, stable for surgery.  I have reviewed the patient's chart and labs.  Questions were answered to the patient's satisfaction.     [name]

## 2019-07-04 NOTE — Anesthesia Postprocedure Evaluation (Signed)
Anesthesia Post Note  Patient: Damon [name]  Procedure(s) Performed: TRANSESOPHAGEAL ECHOCARDIOGRAM (TEE) (N/A ) CARDIOVERSION (N/A )     Patient location during evaluation: Endoscopy Anesthesia Type: MAC Level of consciousness: awake and alert Pain management: pain level controlled Vital Signs Assessment: post-procedure vital signs reviewed and stable Respiratory status: spontaneous breathing, nonlabored ventilation, respiratory function stable and patient connected to nasal cannula oxygen Cardiovascular status: blood pressure returned to baseline and stable Postop Assessment: no apparent nausea or vomiting Anesthetic complications: no    Last Vitals:  Vitals:   07/04/19 1237 07/04/19 1245  BP: (!) 121/97 (!) 91/59  Pulse: 65 61  Resp: 18 14  Temp: 36.6 C   SpO2: 94% 95%    Last Pain:  Vitals:   07/04/19 1245  TempSrc:   PainSc: 0-No pain                 Damon [name]

## 2019-07-04 NOTE — CV Procedure (Signed)
     Transesophageal Echocardiogram Note  Damon [name] [id number] 31-Jul-1945  Procedure: Transesophageal Echocardiogram Indications: atrial fibrillation  Procedure Details Consent: Obtained Time Out: Verified patient identification, verified procedure, site/side was marked, verified correct patient position, special equipment/implants available, Radiology Safety Procedures followed,  medications/allergies/relevent history reviewed, required imaging and test results available.  Performed  Medications: During this procedure the patient is administered iv propofol by anesthesia staff  to achieve and maintain deep sedation.  The patient's heart rate, blood pressure, and oxygen saturation are monitored continuously during the procedure. The period of conscious sedation is 30 minutes, of which I was present face-to-face 100% of this time.  No intracardiac source of embolism was identified.   Complications: No apparent complications Patient did tolerate procedure well.  [name], MD, [facility] 07/04/2019, 12:37 PM     Cardioversion Note  Damon [name] [id number] Jun 28, 1946  Procedure: DC Cardioversion Indications: atrial fibrillation  Procedure Details Consent: Obtained Time Out: Verified patient identification, verified procedure, site/side was marked, verified correct patient position, special equipment/implants available, Radiology Safety Procedures followed,  medications/allergies/relevent history reviewed, required imaging and test results available.  Performed  The patient has been on adequate anticoagulation.  The patient received IV propofol administered by anesthesia staff for sedation.  Synchronous cardioversion was performed at 120 joules.  The cardioversion was successful.   Complications: No apparent complications Patient did tolerate procedure well.   [name], MD, [facility] 07/04/2019, 12:39 PM

## 2019-07-06 ENCOUNTER — Telehealth (HOSPITAL_COMMUNITY): Payer: Self-pay | Admitting: *Deleted

## 2019-07-06 NOTE — Telephone Encounter (Signed)
Patient called in stating starting yesterday he has been experiencing nausea. Pt denies shortness of breath, chest pain, coughing up blood, chest pain. HR is 68 BP is stable. He does report his discharge papers post discharge told him to stop xanax so he has not had that since Sunday night. Discussed with Roderic Palau NP Instructed pt to contact PCP regarding nausea ?caused by abrupt d/c xanax, reflux etc - pt will call PCP office.

## 2019-07-07 ENCOUNTER — Other Ambulatory Visit: Payer: Self-pay

## 2019-07-07 ENCOUNTER — Encounter: Payer: Self-pay | Admitting: Adult Health

## 2019-07-07 ENCOUNTER — Ambulatory Visit (INDEPENDENT_AMBULATORY_CARE_PROVIDER_SITE_OTHER): Payer: Medicare Other | Admitting: Adult Health

## 2019-07-07 VITALS — BP 120/70 | HR 58 | Temp 97.5°F | Ht 69.0 in | Wt 196.6 lb

## 2019-07-07 DIAGNOSIS — R110 Nausea: Secondary | ICD-10-CM

## 2019-07-07 DIAGNOSIS — R1013 Epigastric pain: Secondary | ICD-10-CM | POA: Diagnosis not present

## 2019-07-07 MED ORDER — SUCRALFATE 1 G PO TABS: 1.0000 g | ORAL_TABLET | Freq: Four times a day (QID) | ORAL | 0 refills | Status: DC

## 2019-07-07 MED ORDER — PANTOPRAZOLE SODIUM 40 MG PO TBEC: 40.0000 mg | DELAYED_RELEASE_TABLET | Freq: Every day | ORAL | 0 refills | Status: DC

## 2019-07-07 MED ORDER — ONDANSETRON 8 MG PO TBDP: 8.0000 mg | ORAL_TABLET | Freq: Three times a day (TID) | ORAL | 0 refills | Status: DC | PRN

## 2019-07-07 NOTE — Patient Instructions (Addendum)
Start pantoprazole 1 tab daily for 2-3 weeks  Take zofran under your tongue as needed for nausea, push fluid intake, stick to bland foods, avoid fatty, spicy foods  If needed can also add carafate - can dissolve in 1-2 ounces of water and drink - this will coat your esophagus and stomach - will help very quickly if any irritation related to your procedure  Please let me know if any new symptoms or not improving much over the next few days   Please go to the ER if you have any severe AB pain, unable to hold down food/water, blood in stool or vomit, chest pain, shortness of breath, etc    Nausea, Adult Nausea is the feeling that you have an upset stomach or that you are about to vomit. Nausea on its own is not usually a serious concern, but it may be an early sign of a more serious medical problem. As nausea gets worse, it can lead to vomiting. If vomiting develops, or if you are not able to drink enough fluids, you are at risk of becoming dehydrated. Dehydration can make you tired and thirsty, cause you to have a dry mouth, and decrease how often you urinate. Older adults and people with other diseases or a weak disease-fighting system (immune system) are at higher risk for dehydration. The main goals of treating your nausea are:  To relieve your nausea.  To limit repeated nausea episodes.  To prevent vomiting and dehydration. Follow these instructions at home: Watch your symptoms for any changes. Tell your health care provider about them. Follow these instructions as told by your health care provider. Eating and drinking      Take an oral rehydration solution (ORS). This is a drink that is sold at pharmacies and retail stores.  Drink clear fluids slowly and in small amounts as you are able. Clear fluids include water, ice chips, low-calorie sports drinks, and fruit juice that has water added (diluted fruit juice).  Eat bland, easy-to-digest foods in small amounts as you are able. These  foods include bananas, applesauce, rice, lean meats, toast, and crackers.  Avoid drinking fluids that contain a lot of sugar or caffeine, such as energy drinks, sports drinks, and soda.  Avoid alcohol.  Avoid spicy or fatty foods. General instructions  Take over-the-counter and prescription medicines only as told by your health care provider.  Rest at home while you recover.  Drink enough fluid to keep your urine pale yellow.  Breathe slowly and deeply when you feel nauseous.  Avoid smelling things that have strong odors.  Wash your hands often using soap and water. If soap and water are not available, use hand sanitizer.  Make sure that all people in your household wash their hands well and often.  Keep all follow-up visits as told by your health care provider. This is important. Contact a health care provider if:  Your nausea gets worse.  Your nausea does not go away after two days.  You vomit.  You cannot drink fluids without vomiting.  You have any of the following: ? New symptoms. ? A fever. ? A headache. ? Muscle cramps. ? A rash. ? Pain while urinating.  You feel light-headed or dizzy. Get help right away if:  You have pain in your chest, neck, arm, or jaw.  You feel extremely weak or you faint.  You have vomit that is bright red or looks like coffee grounds.  You have bloody or black stools or stools  that look like tar.  You have a severe headache, a stiff neck, or both.  You have severe pain, cramping, or bloating in your abdomen.  You have difficulty breathing or are breathing very quickly.  Your heart is beating very quickly.  Your skin feels cold and clammy.  You feel confused.  You have signs of dehydration, such as: ? Dark urine, very little urine, or no urine. ? Cracked lips. ? Dry mouth. ? Sunken eyes. ? Sleepiness. ? Weakness. These symptoms may represent a serious problem that is an emergency. Do not wait to see if the symptoms  will go away. Get medical help right away. Call your local emergency services (911 in the [place] Do not drive yourself to the hospital. Summary  Nausea is the feeling that you have an upset stomach or that you are about to vomit. Nausea on its own is not usually a serious concern, but it may be an early sign of a more serious medical problem.  If vomiting develops, or if you are not able to drink enough fluids, you are at risk of becoming dehydrated.  Follow recommendations for eating and drinking and take over-the-counter and prescription medicines only as told by your health care provider.  Contact a health care provider right away if your symptoms worsen or you have new symptoms.  Keep all follow-up visits as told by your health care provider. This is important. This information is not intended to replace advice given to you by your health care provider. Make sure you discuss any questions you have with your health care provider. Document Released: 08/14/2004 Document Revised: 12/15/2017 Document Reviewed: 12/15/2017 Elsevier Patient Education  2020 [employer].

## 2019-07-07 NOTE — Progress Notes (Signed)
Assessment and Plan:  Riyon was seen today for gastroesophageal reflux and nausea.  Diagnoses and all orders for this visit:  Epigastric discomfort/ Nausea ? R/t recent procedure, ? mild gastritis However will check upper abdominal labs - liver/renal functions, blood counts, lipase Benign exam today; if labs normal proceed with medications below Bland diet recommended; avoid fried/spicy food, NSAIDs, ETOH Follow up if not improving within a few days or with any new symptoms Plan to obtain abd [place] if persistent to r/o liver/gallbladder etiology CT abd if new abdominal pain The patient was advised to call immediately if he has any concerning symptoms in the interval. The patient voices understanding of current treatment options and is in agreement with the current care plan.The patient knows to call the clinic with any problems, questions or concerns. Please go to the ER if you have any severe AB pain, unable to hold down food/water, blood in stool or vomit, chest pain, shortness of breath. -     CBC with Diff -     COMPLETE METABOLIC PANEL WITH GFR -     Lipase -     pantoprazole (PROTONIX) 40 MG tablet; Take 1 tablet (40 mg total) by mouth daily. -     sucralfate (CARAFATE) 1 g tablet; Take 1 tablet (1 g total) by mouth 4 (four) times daily. Dissolve in 1-2 ounces of water and drink as needed for stomach discomfort. -     ondansetron (ZOFRAN ODT) 8 MG disintegrating tablet; Take 1 tablet (8 mg total) by mouth every 8 (eight) hours as needed for nausea or vomiting.   Further disposition pending results of labs. Discussed med's effects and SE's.   Over 30 minutes of exam, counseling, chart review, and critical decision making was performed.   Future Appointments  Date Time Provider [place]  07/11/2019  3:00 PM Sherran Needs, NP [facility] None  08/04/2019  3:00 PM [name], MD [facility] None  09/06/2019 10:30 AM Imogene Burn, PA-C [facility]   04/18/2020  9:00 AM Liane Comber, NP [facility] None    ------------------------------------------------------------------------------------------------------------------   HPI BP 120/70   Pulse (!) 58   Temp (!) 97.5 F (36.4 C)   Ht 5\' 9"  (1.753 m)   Wt 196 lb 9.6 oz (89.2 kg)   SpO2 97%   BMI 29.03 kg/m   73 y.o.male presents for evaluation due to 4 days of nausea, some epigastric pressure, increased belching, sensation of fecal urgency "like I might have diarrhea" but with normal bowel movements. He reports this began after his recent TEE with cardioversion on 07/04/2019 by Dr. Meda Coffee. He reports appetite is poor but mainly due to nausea. He is keeping down fluids though this makes him nauseous. He reports nausea only with drinking or eating. He hasn't tried anything for this. He contacted cardiology/anesthesia to report but was advised due to duration since anesthesia unlikely to be related and to follow up with [place].   He reports remote history of hiatal hernia in 47s, dx of GERD but hasn't needed to take medications for this other than when given prophylactically after previous transesophageal procedures. Notably at last in February was given pantoprazole 40 mg which he reports he took per instructions for 3 weeks. He does recall that a few weeks prior to his procedure he had a sensation of some "acidity" in his stomach but resolved after stopping vitamin C. Denies recent sensation of reflux, hoarseness, cough. Denies abdominal pain. No fever/chills.   Denies NSAID use,  no recent ETOH use.   Past Medical History:  Diagnosis Date  . Bladder cancer ([place])   . Hearing loss of both ears   . History of colon polyps    BENIGN  . Hyperlipidemia   . Hypertension   . Mitral regurgitation   . OSA on CPAP    cpap settign of 13  . PAD (peripheral artery disease) ([facility])    ABI--  RIGHT SIDE NORMAL LEFT SIDE MODERATELY REDUCED W/ DISTAL LEFT SFA STENOSIS//  MILD CALDICATION  . PAF  (paroxysmal atrial fibrillation) ([place])     a. s/p PVI 2014; b. s/p redo PVI and CTI 01/2015  . Pre-diabetes   . Vitamin D deficiency      No Known Allergies  Current Outpatient Medications on File Prior to Visit  Medication Sig  . bisoprolol-hydrochlorothiazide (ZIAC) 10-6.25 MG tablet Take 1 tablet Daily for BP  . Cholecalciferol (VITAMIN D3) 125 MCG (5000 UT) CAPS Take 10,000 units Daily  . diltiazem (CARTIA XT) 180 MG 24 hr capsule Take 1 capsule by mouth once daily  . ezetimibe (ZETIA) 10 MG tablet Take 1 tablet Daily for Cholesterol  . Multiple Vitamin (MULTIVITAMIN WITH MINERALS) TABS tablet Take 1 tablet Daily  . nitroGLYCERIN (NITROSTAT) 0.4 MG SL tablet DISSOLVE ONE TABLET UNDER THE TONGUE EVERY 5 MINUTES AS NEEDED FOR CHEST PAIN.&nbsp;&nbsp;DO NOT EXCEED A TOTAL OF 3 DOSES IN 15 MINUTES  . Polyvinyl Alcohol-Povidone (REFRESH OP) Place 1 drop into both eyes daily as needed (dry eyes).  . rivaroxaban (XARELTO) 20 MG TABS tablet TAKE 1 TABLET BY MOUTH ONCE DAILY WITH SUPPER  . rosuvastatin (CRESTOR) 40 MG tablet Take 1 tablet Daily for Cholesterol  . zinc gluconate 50 MG tablet Take 50 mg by mouth daily.  . valACYclovir (VALTREX) 1000 MG tablet Take 1 tablet (1,000 mg total) by mouth 3 (three) times daily. Take for 7 days   No current facility-administered medications on file prior to visit.    ROS: Review of Systems  Constitutional: Negative for chills, diaphoresis, fever and malaise/fatigue.  HENT: Negative for congestion and sore throat.   Eyes: Negative for blurred vision.  Respiratory: Negative for cough, sputum production, shortness of breath and wheezing.   Cardiovascular: Negative for chest pain, palpitations, orthopnea, leg swelling and PND.  Gastrointestinal: Positive for nausea. Negative for abdominal pain, blood in stool, constipation, diarrhea, heartburn, melena and vomiting.  Genitourinary: Negative.   Skin: Negative for rash.  Neurological: Negative for  dizziness, tingling and headaches.  Endo/Heme/Allergies: Negative for polydipsia.  Psychiatric/Behavioral: Negative for substance abuse. The patient is not nervous/anxious.      Physical Exam:  BP 120/70   Pulse (!) 58   Temp (!) 97.5 F (36.4 C)   Ht 5\' 9"  (1.753 m)   Wt 196 lb 9.6 oz (89.2 kg)   SpO2 97%   BMI 29.03 kg/m   General Appearance: Well nourished, in no apparent distress. Eyes: PERRLA, conjunctiva no swelling or erythema Sinuses: No Frontal/maxillary tenderness ENT/Mouth: No erythema, swelling, or exudate on post pharynx.  Tonsils not swollen or erythematous. Hearing normal.  Neck: Supple Respiratory: Respiratory effort normal, BS equal bilaterally without rales, rhonchi, wheezing or stridor.  Cardio: RRR with no MRGs. Brisk peripheral pulses without edema.  Abdomen: Soft, non-distended, + BS.  Vague discomfort with RUQ palpation, non-tender, no guarding, rebound, hernias, masses. Lymphatics: Non tender without lymphadenopathy.  Musculoskeletal: No obvious deformity, symmetrical strength, normal gait.  Skin: Warm, dry without rashes, lesions, ecchymosis.  Neuro: Cranial  nerves intact. Normal muscle tone, Sensation intact.  Psych: Awake and oriented X 3, normal affect, Insight and Judgment appropriate.     Izora Ribas, NP 9:03 AM [facility]

## 2019-07-08 LAB — COMPLETE METABOLIC PANEL WITH GFR
AG Ratio: 2.1 (calc) (ref 1.0–2.5)
ALT: 36 U/L (ref 9–46)
AST: 21 U/L (ref 10–35)
Albumin: 4.4 g/dL (ref 3.6–5.1)
Alkaline phosphatase (APISO): 53 U/L (ref 35–144)
BUN: 15 mg/dL (ref 7–25)
CO2: 27 mmol/L (ref 20–32)
Calcium: 9.8 mg/dL (ref 8.6–10.3)
Chloride: 107 mmol/L (ref 98–110)
Creat: 0.86 mg/dL (ref 0.70–1.18)
GFR, Est African American: 99 mL/min/{1.73_m2} (ref >=60)
GFR, Est Non African American: 85 mL/min/{1.73_m2} (ref >=60)
Globulin: 2.1 g/dL (calc) (ref 1.9–3.7)
Glucose, Bld: 101 mg/dL — ABNORMAL HIGH (ref 65–99)
Potassium: 3.7 mmol/L (ref 3.5–5.3)
Sodium: 144 mmol/L (ref 135–146)
Total Bilirubin: 1.8 mg/dL — ABNORMAL HIGH (ref 0.2–1.2)
Total Protein: 6.5 g/dL (ref 6.1–8.1)

## 2019-07-08 LAB — CBC WITH DIFFERENTIAL/PLATELET
Absolute Monocytes: 483 cells/uL (ref 200–950)
Basophils Absolute: 28 cells/uL (ref 0–200)
Basophils Relative: 0.4 %
Eosinophils Absolute: 104 cells/uL (ref 15–500)
Eosinophils Relative: 1.5 %
HCT: 45 % (ref 38.5–50.0)
Hemoglobin: 15 g/dL (ref 13.2–17.1)
Lymphs Abs: 1242 cells/uL (ref 850–3900)
MCH: 29.9 pg (ref 27.0–33.0)
MCHC: 33.3 g/dL (ref 32.0–36.0)
MCV: 89.6 fL (ref 80.0–100.0)
MPV: 9.5 fL (ref 7.5–12.5)
Monocytes Relative: 7 %
Neutro Abs: 5044 cells/uL (ref 1500–7800)
Neutrophils Relative %: 73.1 %
Platelets: 227 10*3/uL (ref 140–400)
RBC: 5.02 10*6/uL (ref 4.20–5.80)
RDW: 12.1 % (ref 11.0–15.0)
Total Lymphocyte: 18 %
WBC: 6.9 10*3/uL (ref 3.8–10.8)

## 2019-07-08 LAB — LIPASE: Lipase: 16 U/L (ref 7–60)

## 2019-07-11 ENCOUNTER — Encounter: Payer: Self-pay | Admitting: Adult Health

## 2019-07-11 ENCOUNTER — Other Ambulatory Visit: Payer: Self-pay

## 2019-07-11 ENCOUNTER — Ambulatory Visit (HOSPITAL_COMMUNITY)
Admission: RE | Admit: 2019-07-11 | Discharge: 2019-07-11 | Disposition: A | Discharge status: Home / Self Care | Payer: Medicare Other | Source: Ambulatory Visit | Attending: Nurse Practitioner | Admitting: Nurse Practitioner

## 2019-07-11 ENCOUNTER — Ambulatory Visit (INDEPENDENT_AMBULATORY_CARE_PROVIDER_SITE_OTHER): Payer: Medicare Other | Admitting: Adult Health

## 2019-07-11 VITALS — BP 118/62 | HR 56 | Ht 69.0 in | Wt 193.8 lb

## 2019-07-11 VITALS — BP 110/70 | HR 63 | Temp 98.1°F | Ht 69.0 in | Wt 194.0 lb

## 2019-07-11 DIAGNOSIS — I480 Paroxysmal atrial fibrillation: Secondary | ICD-10-CM | POA: Insufficient documentation

## 2019-07-11 DIAGNOSIS — Z7901 Long term (current) use of anticoagulants: Secondary | ICD-10-CM | POA: Diagnosis not present

## 2019-07-11 DIAGNOSIS — Z79899 Other long term (current) drug therapy: Secondary | ICD-10-CM | POA: Diagnosis not present

## 2019-07-11 DIAGNOSIS — R7303 Prediabetes: Secondary | ICD-10-CM | POA: Insufficient documentation

## 2019-07-11 DIAGNOSIS — Z87891 Personal history of nicotine dependence: Secondary | ICD-10-CM | POA: Diagnosis not present

## 2019-07-11 DIAGNOSIS — K2900 Acute gastritis without bleeding: Secondary | ICD-10-CM | POA: Diagnosis not present

## 2019-07-11 DIAGNOSIS — D6869 Other thrombophilia: Secondary | ICD-10-CM

## 2019-07-11 DIAGNOSIS — G4733 Obstructive sleep apnea (adult) (pediatric): Secondary | ICD-10-CM | POA: Insufficient documentation

## 2019-07-11 DIAGNOSIS — E785 Hyperlipidemia, unspecified: Secondary | ICD-10-CM | POA: Insufficient documentation

## 2019-07-11 DIAGNOSIS — I4819 Other persistent atrial fibrillation: Secondary | ICD-10-CM | POA: Diagnosis not present

## 2019-07-11 DIAGNOSIS — R001 Bradycardia, unspecified: Secondary | ICD-10-CM | POA: Diagnosis not present

## 2019-07-11 DIAGNOSIS — Z8249 Family history of ischemic heart disease and other diseases of the circulatory system: Secondary | ICD-10-CM | POA: Insufficient documentation

## 2019-07-11 DIAGNOSIS — Z8551 Personal history of malignant neoplasm of bladder: Secondary | ICD-10-CM | POA: Diagnosis not present

## 2019-07-11 DIAGNOSIS — I739 Peripheral vascular disease, unspecified: Secondary | ICD-10-CM | POA: Insufficient documentation

## 2019-07-11 DIAGNOSIS — I340 Nonrheumatic mitral (valve) insufficiency: Secondary | ICD-10-CM | POA: Diagnosis not present

## 2019-07-11 DIAGNOSIS — Z96642 Presence of left artificial hip joint: Secondary | ICD-10-CM | POA: Insufficient documentation

## 2019-07-11 DIAGNOSIS — I10 Essential (primary) hypertension: Secondary | ICD-10-CM | POA: Diagnosis not present

## 2019-07-11 DIAGNOSIS — E559 Vitamin D deficiency, unspecified: Secondary | ICD-10-CM | POA: Insufficient documentation

## 2019-07-11 MED ORDER — FAMOTIDINE 20 MG PO TABS: 20.0000 mg | ORAL_TABLET | Freq: Every day | ORAL | 0 refills | Status: DC

## 2019-07-11 NOTE — Patient Instructions (Addendum)
Bland foods  Avoid ibuprofen, etc  Protonix 40 mg daily  Famotidine 20-40 mg 1 hour prior to bedtime  carafate 4 times a day 30-60 min before meals and bedtime   Avoid lying flat for a few days - sleep in recliner or with several pillows     Food Choices for Gastroesophageal Reflux Disease, Adult When you have gastroesophageal reflux disease (GERD), the foods you eat and your eating habits are very important. Choosing the right foods can help ease your discomfort. Think about working with a nutrition specialist (dietitian) to help you make good choices. What are tips for following this plan?  Meals  Choose healthy foods that are low in fat, such as fruits, vegetables, whole grains, low-fat dairy products, and lean meat, fish, and poultry.  Eat small meals often instead of 3 large meals a day. Eat your meals slowly, and in a place where you are relaxed. Avoid bending over or lying down until 2-3 hours after eating.  Avoid eating meals 2-3 hours before bed.  Avoid drinking a lot of liquid with meals.  Cook foods using methods other than frying. Bake, grill, or broil food instead.  Avoid or limit: ? Chocolate. ? Peppermint or spearmint. ? Alcohol. ? Pepper. ? Black and decaffeinated coffee. ? Black and decaffeinated tea. ? Bubbly (carbonated) soft drinks. ? Caffeinated energy drinks and soft drinks.  Limit high-fat foods such as: ? Fatty meat or fried foods. ? Whole milk, cream, butter, or ice cream. ? Nuts and nut butters. ? Pastries, donuts, and sweets made with butter or shortening.  Avoid foods that cause symptoms. These foods may be different for everyone. Common foods that cause symptoms include: ? Tomatoes. ? Oranges, lemons, and limes. ? Peppers. ? Spicy food. ? Onions and garlic. ? Vinegar. Lifestyle  Maintain a healthy weight. Ask your doctor what weight is healthy for you. If you need to lose weight, work with your doctor to do so safely.  Exercise  for at least 30 minutes for 5 or more days each week, or as told by your doctor.  Wear loose-fitting clothes.  Do not smoke. If you need help quitting, ask your doctor.  Sleep with the head of your bed higher than your feet. Use a wedge under the mattress or blocks under the bed frame to raise the head of the bed. Summary  When you have gastroesophageal reflux disease (GERD), food and lifestyle choices are very important in easing your symptoms.  Eat small meals often instead of 3 large meals a day. Eat your meals slowly, and in a place where you are relaxed.  Limit high-fat foods such as fatty meat or fried foods.  Avoid bending over or lying down until 2-3 hours after eating.  Avoid peppermint and spearmint, caffeine, alcohol, and chocolate. This information is not intended to replace advice given to you by your health care provider. Make sure you discuss any questions you have with your health care provider. Document Released: 01/06/2012 Document Revised: 10/28/2018 Document Reviewed: 08/12/2016 Elsevier Patient Education  [place].     Gastritis, Adult  Gastritis is swelling (inflammation) of the stomach. Gastritis can develop quickly (acute). It can also develop slowly over time (chronic). It is important to get help for this condition. If you do not get help, your stomach can bleed, and you can get sores (ulcers) in your stomach. What are the causes? This condition may be caused by:  Germs that get to your stomach.  Drinking  too much alcohol.  Medicines you are taking.  Too much acid in the stomach.  A disease of the intestines or stomach.  Stress.  An allergic reaction.  Crohn's disease.  Some cancer treatments (radiation). Sometimes the cause of this condition is not known. What are the signs or symptoms? Symptoms of this condition include:  Pain in your stomach.  A burning feeling in your stomach.  Feeling sick to your stomach  (nauseous).  Throwing up (vomiting).  Feeling too full after you eat.  Weight loss.  Bad breath.  Throwing up blood.  Blood in your poop (stool). How is this diagnosed? This condition may be diagnosed with:  Your medical history and symptoms.  A physical exam.  Tests. These can include: ? Blood tests. ? Stool tests. ? A procedure to look inside your stomach (upper endoscopy). ? A test in which a sample of tissue is taken for testing (biopsy). How is this treated? Treatment for this condition depends on what caused it. You may be given:  Antibiotic medicine, if your condition was caused by germs.  H2 blockers and similar medicines, if your condition was caused by too much acid. Follow these instructions at home: Medicines  Take over-the-counter and prescription medicines only as told by your doctor.  If you were prescribed an antibiotic medicine, take it as told by your doctor. Do not stop taking it even if you start to feel better. Eating and drinking   Eat small meals often, instead of large meals.  Avoid foods and drinks that make your symptoms worse.  Drink enough fluid to keep your pee (urine) pale yellow. Alcohol use  Do not drink alcohol if: ? Your doctor tells you not to drink. ? You are pregnant, may be pregnant, or are planning to become pregnant.  If you drink alcohol: ? Limit your use to:  0-1 drink a day for women.  0-2 drinks a day for men. ? Be aware of how much alcohol is in your drink. In the [place] one drink equals one 12 oz bottle of beer (355 mL), one 5 oz glass of wine (148 mL), or one 1 oz glass of hard liquor (44 mL). General instructions  Talk with your doctor about ways to manage stress. You can exercise or do deep breathing, meditation, or yoga.  Do not smoke or use products that have nicotine or tobacco. If you need help quitting, ask your doctor.  Keep all follow-up visits as told by your doctor. This is important. Contact a  doctor if:  Your symptoms get worse.  Your symptoms go away and then come back. Get help right away if:  You throw up blood or something that looks like coffee grounds.  You have black or dark red poop.  You throw up any time you try to drink fluids.  Your stomach pain gets worse.  You have a fever.  You do not feel better after one week. Summary  Gastritis is swelling (inflammation) of the stomach.  You must get help for this condition. If you do not get help, your stomach can bleed, and you can get sores (ulcers).  This condition is diagnosed with medical history, physical exam, or tests.  You can be treated with medicines for germs or medicines to block too much acid in your stomach. This information is not intended to replace advice given to you by your health care provider. Make sure you discuss any questions you have with your health care provider. Document  Released: 12/24/2007 Document Revised: 11/24/2017 Document Reviewed: 11/24/2017 Elsevier Patient Education  2020 [employer].

## 2019-07-11 NOTE — Progress Notes (Signed)
Assessment and Plan:  Damon [name] was seen today for gi problem.  Diagnoses and all orders for this visit:  Acute gastritis, presence of bleeding unspecified, unspecified gastritis type Following procedure; improving with protonix, carafate, upright positioning Reviewed lifestyle again at length; avoid NSAIDs, caffeine, ETOH, pepper, spicy foods, fatty foods, etc Advised to avoid supine position for 2-4 hours after meals; sleep up right in recliner for a few days may be beneficial Anticipate gradual improvement over the next several weeks If persistent will recommend referral to GI for possible need EGD The patient was advised to call immediately if he has any concerning symptoms in the interval. The patient voices understanding of current treatment options and is in agreement with the current care plan.The patient knows to call the clinic with any problems, questions or concerns or go to the ER if any further progression of symptoms.  Please go to the ER if you have any severe AB pain, unable to hold down food/water, blood in stool or vomit, chest pain, shortness of breath, or any worsening symptoms.  -     famotidine (PEPCID) 20 MG tablet; Take 1-2 tablets (20-40 mg total) by mouth at bedtime.  Further disposition pending results of labs. Discussed med's effects and SE's.   Over 30 minutes of exam, counseling, chart review, and critical decision making was performed.   Future Appointments  Date Time Provider [employer]  07/11/2019  2:00 PM Liane Comber, NP [facility] None  07/11/2019  3:00 PM Sherran Needs, NP [facility] None  08/04/2019  3:00 PM [name], MD [facility] None  09/06/2019 10:30 AM Imogene Burn, PA-C [facility]  04/18/2020  9:00 AM Liane Comber, NP [facility] None    ------------------------------------------------------------------------------------------------------------------   HPI BP 110/70   Pulse 63   Temp 98.1 F (36.7 C)    Ht 5\' 9"  (1.753 m)   Wt 194 lb (88 kg)   SpO2 95%   BMI 28.65 kg/m   73 y.o.male presents for follow up due to persistent epigastric discomfort with nausea evaluated 12/17, ongoing following his recent TEE with cardioversion on 07/04/2019 by Dr. Meda Coffee. He had essentially normal CBC, CMP, lipase (elevated bilirubin 1.8 stable from baseline, had negative [place] abd in 2017 for this). He reports nausea has improved/essentially resolved, has zofran but hasn't needed to use. Has been doing protonix 40 mg daily before breakfast; taking carafate dissolved in water TID. Was feeling much improved with PPI and carafate but had "relapse" on Saturday which he believes was triggered by eggs with extra pepper Saturday morning. Reports has been lying around most of the weekend, has been up more today and feeling much better.  Does endorse brief sensation of fluttering in chest but quickly/spontaneously resolves. Denies CP, dyspnea, chest tightness, edema, fatigue, fever/chills, vomiting, dizziness.    Past Medical History:  Diagnosis Date  . Bladder cancer ([place])   . Hearing loss of both ears   . History of colon polyps    BENIGN  . Hyperlipidemia   . Hypertension   . Mitral regurgitation   . OSA on CPAP    cpap settign of 13  . PAD (peripheral artery disease) ([facility])    ABI--  RIGHT SIDE NORMAL LEFT SIDE MODERATELY REDUCED W/ DISTAL LEFT SFA STENOSIS//  MILD CALDICATION  . PAF (paroxysmal atrial fibrillation) ([place])     a. s/p PVI 2014; b. s/p redo PVI and CTI 01/2015  . Pre-diabetes   . Vitamin D deficiency      No  Known Allergies  Current Outpatient Medications on File Prior to Visit  Medication Sig  . bisoprolol-hydrochlorothiazide (ZIAC) 10-6.25 MG tablet Take 1 tablet Daily for BP  . Cholecalciferol (VITAMIN D3) 125 MCG (5000 UT) CAPS Take 10,000 units Daily  . diltiazem (CARTIA XT) 180 MG 24 hr capsule Take 1 capsule by mouth once daily  . ezetimibe (ZETIA) 10 MG tablet Take 1 tablet Daily for  Cholesterol  . Multiple Vitamin (MULTIVITAMIN WITH MINERALS) TABS tablet Take 1 tablet Daily  . nitroGLYCERIN (NITROSTAT) 0.4 MG SL tablet DISSOLVE ONE TABLET UNDER THE TONGUE EVERY 5 MINUTES AS NEEDED FOR CHEST PAIN.&nbsp;&nbsp;DO NOT EXCEED A TOTAL OF 3 DOSES IN 15 MINUTES  . ondansetron (ZOFRAN ODT) 8 MG disintegrating tablet Take 1 tablet (8 mg total) by mouth every 8 (eight) hours as needed for nausea or vomiting.  . pantoprazole (PROTONIX) 40 MG tablet Take 1 tablet (40 mg total) by mouth daily.  . Polyvinyl Alcohol-Povidone (REFRESH OP) Place 1 drop into both eyes daily as needed (dry eyes).  . rivaroxaban (XARELTO) 20 MG TABS tablet TAKE 1 TABLET BY MOUTH ONCE DAILY WITH SUPPER  . rosuvastatin (CRESTOR) 40 MG tablet Take 1 tablet Daily for Cholesterol  . sucralfate (CARAFATE) 1 g tablet Take 1 tablet (1 g total) by mouth 4 (four) times daily. Dissolve in 1-2 ounces of water and drink as needed for stomach discomfort.  . valACYclovir (VALTREX) 1000 MG tablet Take 1 tablet (1,000 mg total) by mouth 3 (three) times daily. Take for 7 days  . zinc gluconate 50 MG tablet Take 50 mg by mouth daily.   No current facility-administered medications on file prior to visit.    ROS: all negative except above.   Physical Exam:  BP 110/70   Pulse 63   Temp 98.1 F (36.7 C)   Ht 5\' 9"  (1.753 m)   Wt 194 lb (88 kg)   SpO2 95%   BMI 28.65 kg/m   General Appearance: Well nourished, in no apparent distress. Eyes: PERRLA, conjunctiva no swelling or erythema ENT/Mouth: Ext aud canals clear, TMs without erythema, bulging. No erythema, swelling, or exudate on post pharynx.  Tonsils not swollen or erythematous. Hearing normal.  Neck: Supple, thyroid normal.  Respiratory: Respiratory effort normal, BS equal bilaterally without rales, rhonchi, wheezing or stridor.  Cardio: RRR with no MRGs. Brisk peripheral pulses without edema.  Abdomen: Soft, + BS.  Non tender, no guarding, rebound, hernias,  masses. Lymphatics: Non tender without lymphadenopathy.  Musculoskeletal: Full ROM, 5/5 strength, normal gait.  Skin: Warm, dry without rashes, lesions, ecchymosis.  Neuro: Cranial nerves intact. Normal muscle tone, no cerebellar symptoms. Sensation intact.  Psych: Awake and oriented X 3, normal affect, Insight and Judgment appropriate.     Izora Ribas, NP 1:58 PM [facility]

## 2019-07-11 NOTE — Progress Notes (Signed)
Primary Care Physician: [name], MD Referring Physician: [name] is a 73 y.o. male with a h/o paroxysmal afib that is in the clinic for going back into afib sometime over the last couple of days.He had an colonoscopy last Friday which may have been  the trigger. He came off anticoagulation for 2 days prior. He is rate controled today.  F/u in afib clinic, 07/11/19, after successful  cardioversion and continues in SR. He c/o of some burning/nausea after the cardioversion but states it was actually going on before then as he had stopped Vit C thinking he was not tolerating. He has seen his PCP and was started on PPI and symptoms  are  improving.   Today, he denies symptoms of palpitations, chest pain, shortness of breath, orthopnea, PND, lower extremity edema, dizziness, presyncope, syncope, or neurologic sequela. The patient is tolerating medications without difficulties and is otherwise without complaint today.   Past Medical History:  Diagnosis Date  . Bladder cancer ([place])   . Hearing loss of both ears   . History of colon polyps    BENIGN  . Hyperlipidemia   . Hypertension   . Mitral regurgitation   . OSA on CPAP    cpap settign of 13  . PAD (peripheral artery disease) ([facility])    ABI--  RIGHT SIDE NORMAL LEFT SIDE MODERATELY REDUCED W/ DISTAL LEFT SFA STENOSIS//  MILD CALDICATION  . PAF (paroxysmal atrial fibrillation) ([place])     a. s/p PVI 2014; b. s/p redo PVI and CTI 01/2015  . Pre-diabetes   . Vitamin D deficiency    Past Surgical History:  Procedure Laterality Date  . ABLATION OF DYSRHYTHMIC FOCUS  01/26/2015  . ATRIAL FIBRILLATION ABLATION N/A 07/27/2012   Procedure: ATRIAL FIBRILLATION ABLATION;  Surgeon: [name], MD;  Location: [facility] CATH LAB;  Service: Cardiovascular;  Laterality: N/A;  . ATRIAL FIBRILLATION ABLATION N/A 08/25/2018   Procedure: ATRIAL FIBRILLATION ABLATION;  Surgeon: [name], MD;  Location: [place] CV LAB;  Service:  Cardiovascular;  Laterality: N/A;  . CARDIAC ELECTROPHYSIOLOGY STUDY AND ABLATION  07-27-2012  DR ALLRED   SUCCESSFUL ABLATION OF A-FIB  . CARDIOVERSION  08/06/2012   Procedure: CARDIOVERSION;  Surgeon: [name], MD;  Location: [place];  Service: Cardiovascular;  Laterality: N/A;  . CARDIOVERSION N/A 08/25/2014   Procedure: CARDIOVERSION;  Surgeon: Larey Dresser, MD;  Location: [place];  Service: Cardiovascular;  Laterality: N/A;  . CARDIOVERSION N/A 11/13/2014   Procedure: CARDIOVERSION;  Surgeon: Sueanne Margarita, MD;  Location: [facility] ENDOSCOPY;  Service: Cardiovascular;  Laterality: N/A;  . CARDIOVERSION N/A 12/28/2014   Procedure: CARDIOVERSION;  Surgeon: [name], MD;  Location: [place];  Service: Cardiovascular;  Laterality: N/A;  . CARDIOVERSION N/A 01/30/2017   Procedure: CARDIOVERSION;  Surgeon: Josue Hector, MD;  Location: [facility] ENDOSCOPY;  Service: Cardiovascular;  Laterality: N/A;  . CARDIOVERSION N/A 04/06/2017   Procedure: CARDIOVERSION;  Surgeon: Dorothy Spark, MD;  Location: [facility] ENDOSCOPY;  Service: Cardiovascular;  Laterality: N/A;  . CARDIOVERSION N/A 05/25/2018   Procedure: CARDIOVERSION;  Surgeon: Skeet Latch, MD;  Location: [facility] ENDOSCOPY;  Service: Cardiovascular;  Laterality: N/A;  . CARDIOVERSION N/A 08/09/2018   Procedure: CARDIOVERSION;  Surgeon: Buford Dresser, MD;  Location: [facility] ENDOSCOPY;  Service: Cardiovascular;  Laterality: N/A;  . CARDIOVERSION N/A 07/04/2019   Procedure: CARDIOVERSION;  Surgeon: Dorothy Spark, MD;  Location: [place];  Service: Cardiovascular;  Laterality: N/A;  .  CATARACT EXTRACTION W/ INTRAOCULAR LENS  IMPLANT, BILATERAL    . COLONOSCOPY WITH PROPOFOL N/A 02/13/2015   Procedure: COLONOSCOPY WITH PROPOFOL;  Surgeon: Garlan Fair, MD;  Location: [facility] ENDOSCOPY;  Service: Endoscopy;  Laterality: N/A;  . CYSTOSCOPY W/ RETROGRADES Bilateral 04/12/2013   Procedure: CYSTOSCOPY WITH RETROGRADE PYELOGRAM;  Surgeon:  [name], MD;  Location: [facility];  Service: Urology;  Laterality: Bilateral;  . ELECTROPHYSIOLOGIC STUDY N/A 01/26/2015   Procedure: Atrial Fibrillation Ablation;  Surgeon: [name], MD;  Location: [place] CV LAB;  Service: Cardiovascular;  Laterality: N/A;  . LEFT HEART CATHETERIZATION WITH CORONARY ANGIOGRAM N/A 11/24/2013   Procedure: LEFT HEART CATHETERIZATION WITH CORONARY ANGIOGRAM;  Surgeon: [name], MD;  Location: [facility] CATH LAB;  Service: Cardiovascular;  Laterality: N/A;  . PERCUTANEOUS CORONARY ROTOBLATOR INTERVENTION (PCI-R) N/A 11/28/2013   Procedure: PERCUTANEOUS CORONARY ROTOBLATOR INTERVENTION (PCI-R);  Surgeon: [name], MD;  Location: [facility] CATH LAB;  Service: Cardiovascular;  Laterality: N/A;  . PERCUTANEOUS CORONARY STENT INTERVENTION (PCI-S)  11/24/2013   Procedure: PERCUTANEOUS CORONARY STENT INTERVENTION (PCI-S);  Surgeon: [name], MD;  Location: [facility] CATH LAB;  Service: Cardiovascular;;  . TEE WITHOUT CARDIOVERSION  07/26/2012   Procedure: TRANSESOPHAGEAL ECHOCARDIOGRAM (TEE);  Surgeon: [name], MD;  Location: [facility] ENDOSCOPY;  Service: Cardiovascular;  Laterality: N/A;  . TEE WITHOUT CARDIOVERSION N/A 08/25/2014   Procedure: TRANSESOPHAGEAL ECHOCARDIOGRAM (TEE);  Surgeon: Larey Dresser, MD;  Location: [place];  Service: Cardiovascular;  Laterality: N/A;  . TEE WITHOUT CARDIOVERSION  01/26/2015   Procedure: Transesophageal Echocardiogram (Tee);  Surgeon: [name], MD;  Location: [place] CV LAB;  Service: Cardiovascular;;  . TEE WITHOUT CARDIOVERSION N/A 07/04/2019   Procedure: TRANSESOPHAGEAL ECHOCARDIOGRAM (TEE);  Surgeon: Dorothy Spark, MD;  Location: [facility] ENDOSCOPY;  Service: Cardiovascular;  Laterality: N/A;  . TONSILLECTOMY  age 26  . TOTAL HIP ARTHROPLASTY  07/25/2011   Procedure: TOTAL HIP ARTHROPLASTY ANTERIOR APPROACH;  Surgeon: Mcarthur Rossetti;  Location: [facility] ORS;  Service:  Orthopedics;  Laterality: Right;  . TOTAL HIP ARTHROPLASTY Left 02-08-2010  . TRANSURETHRAL RESECTION OF BLADDER TUMOR WITH GYRUS (TURBT-GYRUS) N/A 04/12/2013   Procedure: TRANSURETHRAL RESECTION OF BLADDER TUMOR WITH GYRUS (TURBT-GYRUS);  Surgeon: [name], MD;  Location: [facility];  Service: Urology;  Laterality: N/A;    Current Outpatient Medications  Medication Sig Dispense Refill  . bisoprolol-hydrochlorothiazide (ZIAC) 10-6.25 MG tablet Take 1 tablet Daily for BP 90 tablet 3  . Cholecalciferol (VITAMIN D3) 125 MCG (5000 UT) CAPS Take 10,000 units Daily 30 capsule   . diltiazem (CARTIA XT) 180 MG 24 hr capsule Take 1 capsule by mouth once daily 90 capsule 2  . ezetimibe (ZETIA) 10 MG tablet Take 1 tablet Daily for Cholesterol 90 tablet 3  . famotidine (PEPCID) 20 MG tablet Take 1-2 tablets (20-40 mg total) by mouth at bedtime. 60 tablet 0  . Multiple Vitamin (MULTIVITAMIN WITH MINERALS) TABS tablet Take 1 tablet Daily    . nitroGLYCERIN (NITROSTAT) 0.4 MG SL tablet DISSOLVE ONE TABLET UNDER THE TONGUE EVERY 5 MINUTES AS NEEDED FOR CHEST PAIN.  DO NOT EXCEED A TOTAL OF 3 DOSES IN 15 MINUTES 25 tablet 6  . ondansetron (ZOFRAN ODT) 8 MG disintegrating tablet Take 1 tablet (8 mg total) by mouth every 8 (eight) hours as needed for nausea or vomiting. 30 tablet 0  . pantoprazole (PROTONIX) 40 MG tablet Take 1 tablet (40 mg total) by mouth daily. 30 tablet  0  . Polyvinyl Alcohol-Povidone (REFRESH OP) Place 1 drop into both eyes daily as needed (dry eyes).    . rivaroxaban (XARELTO) 20 MG TABS tablet TAKE 1 TABLET BY MOUTH ONCE DAILY WITH SUPPER 90 tablet 2  . rosuvastatin (CRESTOR) 40 MG tablet Take 1 tablet Daily for Cholesterol 90 tablet 3  . sucralfate (CARAFATE) 1 g tablet Take 1 tablet (1 g total) by mouth 4 (four) times daily. Dissolve in 1-2 ounces of water and drink as needed for stomach discomfort. 120 tablet 0  . zinc gluconate 50 MG tablet Take 50 mg by mouth  daily.     No current facility-administered medications for this encounter.    No Known Allergies  Social History   Socioeconomic History  . Marital status: Married    Spouse name: Not on file  . Number of children: Not on file  . Years of education: Not on file  . Highest education level: Not on file  Occupational History  . Not on file  Tobacco Use  . Smoking status: Former Smoker    Packs/day: 2.00    Years: 10.00    Pack years: 20.00    Types: Cigarettes    Quit date: 09/20/1981    Years since quitting: 37.8  . Smokeless tobacco: Never Used  Substance and Sexual Activity  . Alcohol use: No    Comment: pt states he has stopped drinking alcohol  . Drug use: No  . Sexual activity: Not on file  Other Topics Concern  . Not on file  Social History Narrative  . Not on file   Social Determinants of Health   Financial Resource Strain:   . Difficulty of Paying Living Expenses: Not on file  Food Insecurity:   . Worried About Charity fundraiser in the Last Year: Not on file  . Ran Out of Food in the Last Year: Not on file  Transportation Needs:   . Lack of Transportation (Medical): Not on file  . Lack of Transportation (Non-Medical): Not on file  Physical Activity:   . Days of Exercise per Week: Not on file  . Minutes of Exercise per Session: Not on file  Stress:   . Feeling of Stress : Not on file  Social Connections:   . Frequency of Communication with Friends and Family: Not on file  . Frequency of Social Gatherings with Friends and Family: Not on file  . Attends Religious Services: Not on file  . Active Member of Clubs or Organizations: Not on file  . Attends Archivist Meetings: Not on file  . Marital Status: Not on file  Intimate Partner Violence:   . Fear of Current or Ex-Partner: Not on file  . Emotionally Abused: Not on file  . Physically Abused: Not on file  . Sexually Abused: Not on file    Family History  Problem Relation Age of Onset  .  Heart attack Mother   . Heart disease Mother   . Heart failure Father   . Heart disease Father     ROS- All systems are reviewed and negative except as per the HPI above  Physical Exam: Vitals:   07/11/19 1440  BP: 118/62  Pulse: (!) 56  Weight: 87.9 kg  Height: 5\' 9"  (1.753 m)   Wt Readings from Last 3 Encounters:  07/11/19 87.9 kg  07/11/19 88 kg  07/07/19 89.2 kg    Labs: Lab Results  Component Value Date   NA 144 07/07/2019  K 3.7 07/07/2019   CL 107 07/07/2019   CO2 27 07/07/2019   GLUCOSE 101 (H) 07/07/2019   BUN 15 07/07/2019   CREATININE 0.86 07/07/2019   CALCIUM 9.8 07/07/2019   MG 2.0 04/14/2019   Lab Results  Component Value Date   INR 1.1 (H) 11/16/2013   Lab Results  Component Value Date   CHOL 137 04/14/2019   HDL 51 04/14/2019   LDLCALC 67 04/14/2019   TRIG 100 04/14/2019     GEN- The patient is well appearing, alert and oriented x 3 today.   Head- normocephalic, atraumatic Eyes-  Sclera clear, conjunctiva pink Ears- hearing intact Oropharynx- clear Neck- supple, no JVP Lymph- no cervical lymphadenopathy Lungs- Clear to ausculation bilaterally, normal work of breathing Heart-  regular rate and rhythm, no murmurs, rubs or gallops, PMI not laterally displaced GI- soft, NT, ND, + BS Extremities- no clubbing, cyanosis, or edema MS- no significant deformity or atrophy Skin- no rash or lesion Psych- euthymic mood, full affect Neuro- strength and sensation are intact  EKG- Sinus brady at 56 bpm, pr int 158 ms, qrs int 90 ms, qtc 445 ms    Assessment and Plan: 1. Paroxysmal afib  Successful TEE guided cardioversion  Staying in SR   2. CHA2DS2VASc score of 3 Continue  xarelto 20 mg daily   F/u  in afib as needed   Butch Penny C. Ward [place], [facility] [address] [place], [place] [address] [phone]

## 2019-08-03 ENCOUNTER — Encounter: Payer: Self-pay | Admitting: Internal Medicine

## 2019-08-03 NOTE — Progress Notes (Signed)
Annual  Screening/Preventative Visit  & Comprehensive Evaluation & Examination     This very nice 74 y.o. MWM  presents for a Screening /Preventative Visit & comprehensive evaluation and management of multiple medical co-morbidities.  Patient has been followed for HTN, ASCAD, HLD, Prediabetes and Vitamin D Deficiency.  Patient has hx/o OSA, but was intolerant to the CPAP masks.      HTN predates since 1996. Patient's BP has been controlled at home.  Today's BP is at goal - 134/72.  Patient had PCA/Stenting & Rotator Ablation in 2015. Patient has hx/o pAfib and multiple ablations & CV's (last 07/04/2019)  - most recently Dec 21,2020 in Afib clinic, his EKG on 12 /21 was in NSR.  He remains on Xarelto for CHADsVasc 3. Patient denies any cardiac symptoms as chest pain, palpitations, shortness of breath, dizziness or ankle swelling.     Patient's hyperlipidemia is controlled with diet and medications. Patient denies myalgias or other medication SE's. Last lipids were at goal:  Lab Results  Component Value Date   CHOL 137 04/14/2019   HDL 51 04/14/2019   LDLCALC 67 04/14/2019   TRIG 100 04/14/2019   CHOLHDL 2.7 04/14/2019      Patient has hx/o prediabetes (A1c 5.7%/ 2011 &5.9%/2013) and patient denies reactive hypoglycemic symptoms, visual blurring, diabetic polys or paresthesias. Last A1c was Normal & at goal:  Lab Results  Component Value Date   HGBA1C 5.4 01/06/2019       Finally, patient has history of Vitamin D Deficiency  ("27" / 2008) and last vitamin D was at goal:  Lab Results  Component Value Date   VD25OH 99 01/06/2019    Current Outpatient Medications on File Prior to Visit  Medication Sig  . bisoprolol-hydrochlorothiazide (ZIAC) 10-6.25 MG tablet Take 1 tablet Daily for BP  . Cholecalciferol (VITAMIN D3) 125 MCG (5000 UT) CAPS Take 10,000 units Daily  . diltiazem (CARTIA XT) 180 MG 24 hr capsule Take 1 capsule by mouth once daily  . ezetimibe (ZETIA) 10 MG tablet  Take 1 tablet Daily for Cholesterol  . Multiple Vitamin (MULTIVITAMIN WITH MINERALS) TABS tablet Take 1 tablet Daily  . nitroGLYCERIN (NITROSTAT) 0.4 MG SL tablet DISSOLVE ONE TABLET UNDER THE TONGUE EVERY 5 MINUTES AS NEEDED FOR CHEST PAIN.  DO NOT EXCEED A TOTAL OF 3 DOSES IN 15 MINUTES  . ondansetron (ZOFRAN ODT) 8 MG disintegrating tablet Take 1 tablet (8 mg total) by mouth every 8 (eight) hours as needed for nausea or vomiting.  . Polyvinyl Alcohol-Povidone (REFRESH OP) Place 1 drop into both eyes daily as needed (dry eyes).  . rivaroxaban (XARELTO) 20 MG TABS tablet TAKE 1 TABLET BY MOUTH ONCE DAILY WITH SUPPER  . rosuvastatin (CRESTOR) 40 MG tablet Take 1 tablet Daily for Cholesterol  . zinc gluconate 50 MG tablet Take 50 mg by mouth daily.   No current facility-administered medications on file prior to visit.   No Known Allergies Past Medical History:  Diagnosis Date  . Bladder cancer ([place])   . Hearing loss of both ears   . History of colon polyps    BENIGN  . Hyperlipidemia   . Hypertension   . Mitral regurgitation   . OSA on CPAP    cpap settign of 13  . PAD (peripheral artery disease) ([facility])    ABI--  RIGHT SIDE NORMAL LEFT SIDE MODERATELY REDUCED W/ DISTAL LEFT SFA STENOSIS//  MILD CALDICATION  . PAF (paroxysmal atrial fibrillation) ([place])  a. s/p PVI 2014; b. s/p redo PVI and CTI 01/2015  . Pre-diabetes   . Vitamin D deficiency    Health Maintenance  Topic Date Due  . TETANUS/TDAP  09/23/2023  . COLONOSCOPY  06/23/2024  . INFLUENZA VACCINE  Completed  . Hepatitis C Screening  Completed  . PNA vac Low Risk Adult  Completed   Immunization History  Administered Date(s) Administered  . DT (Pediatric) 07/22/2003, 12/28/2013  . Influenza Split 04/14/2019  . Influenza Whole 05/19/2013  . Influenza, High Dose Seasonal PF 04/08/2017  . Influenza,inj,Quad PF,6+ Mos 05/09/2014  . Influenza-Unspecified 05/14/2015, 05/01/2016, 05/17/2018  . Pneumococcal Conjugate-13  07/11/2014  . Pneumococcal Polysaccharide-23 12/04/2009, 06/10/2016  . Tdap 09/22/2013   Last Colon -  04/22/2016 and most recently in 06/2019 - Dr Stephanie Acre @[facility]   Past Surgical History:  Procedure Laterality Date  . ABLATION OF DYSRHYTHMIC FOCUS  01/26/2015  . ATRIAL FIBRILLATION ABLATION N/A 07/27/2012   Procedure: ATRIAL FIBRILLATION ABLATION;  Surgeon: [name], MD;  Location: [facility] CATH LAB;  Service: Cardiovascular;  Laterality: N/A;  . ATRIAL FIBRILLATION ABLATION N/A 08/25/2018   Procedure: ATRIAL FIBRILLATION ABLATION;  Surgeon: [name], MD;  Location: [place] CV LAB;  Service: Cardiovascular;  Laterality: N/A;  . CARDIAC ELECTROPHYSIOLOGY STUDY AND ABLATION  07-27-2012  DR ALLRED   SUCCESSFUL ABLATION OF A-FIB  . CARDIOVERSION  08/06/2012   Procedure: CARDIOVERSION;  Surgeon: [name], MD;  Location: [facility] ENDOSCOPY;  Service: Cardiovascular;  Laterality: N/A;  . CARDIOVERSION N/A 08/25/2014   Procedure: CARDIOVERSION;  Surgeon: Larey Dresser, MD;  Location: [place];  Service: Cardiovascular;  Laterality: N/A;  . CARDIOVERSION N/A 11/13/2014   Procedure: CARDIOVERSION;  Surgeon: Sueanne Margarita, MD;  Location: [facility] ENDOSCOPY;  Service: Cardiovascular;  Laterality: N/A;  . CARDIOVERSION N/A 12/28/2014   Procedure: CARDIOVERSION;  Surgeon: [name], MD;  Location: [place];  Service: Cardiovascular;  Laterality: N/A;  . CARDIOVERSION N/A 01/30/2017   Procedure: CARDIOVERSION;  Surgeon: Josue Hector, MD;  Location: [facility] ENDOSCOPY;  Service: Cardiovascular;  Laterality: N/A;  . CARDIOVERSION N/A 04/06/2017   Procedure: CARDIOVERSION;  Surgeon: Dorothy Spark, MD;  Location: [facility] ENDOSCOPY;  Service: Cardiovascular;  Laterality: N/A;  . CARDIOVERSION N/A 05/25/2018   Procedure: CARDIOVERSION;  Surgeon: Skeet Latch, MD;  Location: [facility] ENDOSCOPY;  Service: Cardiovascular;  Laterality: N/A;  . CARDIOVERSION N/A 08/09/2018   Procedure: CARDIOVERSION;  Surgeon:  Buford Dresser, MD;  Location: [facility] ENDOSCOPY;  Service: Cardiovascular;  Laterality: N/A;  . CARDIOVERSION N/A 07/04/2019   Procedure: CARDIOVERSION;  Surgeon: Dorothy Spark, MD;  Location: [place];  Service: Cardiovascular;  Laterality: N/A;  . CATARACT EXTRACTION W/ INTRAOCULAR LENS  IMPLANT, BILATERAL    . COLONOSCOPY WITH PROPOFOL N/A 02/13/2015   Procedure: COLONOSCOPY WITH PROPOFOL;  Surgeon: Garlan Fair, MD;  Location: [facility] ENDOSCOPY;  Service: Endoscopy;  Laterality: N/A;  . CYSTOSCOPY W/ RETROGRADES Bilateral 04/12/2013   Procedure: CYSTOSCOPY WITH RETROGRADE PYELOGRAM;  Surgeon: [name], MD;  Location: [facility];  Service: Urology;  Laterality: Bilateral;  . ELECTROPHYSIOLOGIC STUDY N/A 01/26/2015   Procedure: Atrial Fibrillation Ablation;  Surgeon: [name], MD;  Location: [place] CV LAB;  Service: Cardiovascular;  Laterality: N/A;  . LEFT HEART CATHETERIZATION WITH CORONARY ANGIOGRAM N/A 11/24/2013   Procedure: LEFT HEART CATHETERIZATION WITH CORONARY ANGIOGRAM;  Surgeon: [name], MD;  Location: [facility] CATH LAB;  Service: Cardiovascular;  Laterality: N/A;  . PERCUTANEOUS CORONARY ROTOBLATOR INTERVENTION (PCI-R) N/A 11/28/2013  Procedure: PERCUTANEOUS CORONARY ROTOBLATOR INTERVENTION (PCI-R);  Surgeon: [name], MD;  Location: [facility] CATH LAB;  Service: Cardiovascular;  Laterality: N/A;  . PERCUTANEOUS CORONARY STENT INTERVENTION (PCI-S)  11/24/2013   Procedure: PERCUTANEOUS CORONARY STENT INTERVENTION (PCI-S);  Surgeon: [name], MD;  Location: [facility] CATH LAB;  Service: Cardiovascular;;  . TEE WITHOUT CARDIOVERSION  07/26/2012   Procedure: TRANSESOPHAGEAL ECHOCARDIOGRAM (TEE);  Surgeon: [name], MD;  Location: [facility] ENDOSCOPY;  Service: Cardiovascular;  Laterality: N/A;  . TEE WITHOUT CARDIOVERSION N/A 08/25/2014   Procedure: TRANSESOPHAGEAL ECHOCARDIOGRAM (TEE);  Surgeon: Larey Dresser, MD;  Location: [place];  Service: Cardiovascular;  Laterality: N/A;  . TEE WITHOUT CARDIOVERSION  01/26/2015   Procedure: Transesophageal Echocardiogram (Tee);  Surgeon: [name], MD;  Location: [place] CV LAB;  Service: Cardiovascular;;  . TEE WITHOUT CARDIOVERSION N/A 07/04/2019   Procedure: TRANSESOPHAGEAL ECHOCARDIOGRAM (TEE);  Surgeon: Dorothy Spark, MD;  Location: [facility] ENDOSCOPY;  Service: Cardiovascular;  Laterality: N/A;  . TONSILLECTOMY  age 4  . TOTAL HIP ARTHROPLASTY  07/25/2011   Procedure: TOTAL HIP ARTHROPLASTY ANTERIOR APPROACH;  Surgeon: Mcarthur Rossetti;  Location: [facility] ORS;  Service: Orthopedics;  Laterality: Right;  . TOTAL HIP ARTHROPLASTY Left 02-08-2010  . TRANSURETHRAL RESECTION OF BLADDER TUMOR WITH GYRUS (TURBT-GYRUS) N/A 04/12/2013   Procedure: TRANSURETHRAL RESECTION OF BLADDER TUMOR WITH GYRUS (TURBT-GYRUS);  Surgeon: [name], MD;  Location: [facility];  Service: Urology;  Laterality: N/A;   Family History  Problem Relation Age of Onset  . Heart attack Mother   . Heart disease Mother   . Heart failure Father   . Heart disease Father    Social History   Socioeconomic History  . Marital status: Married    Spouse name: Manuela Schwartz  . Number of children: 1 son & 1 daughter  Occupational History  . Retires IT  Tobacco Use  . Smoking status: Former Smoker    Packs/day: 2.00    Years: 10.00    Pack years: 20.00    Types: Cigarettes    Quit date: 09/20/1981    Years since quitting: 37.8  . Smokeless tobacco: Never Used  Substance and Sexual Activity  . Alcohol use: No    Comment: pt states he has stopped drinking alcohol  . Drug use: No  . Sexual activity: Not on file     ROS Constitutional: Denies fever, chills, weight loss/gain, headaches, insomnia,  night sweats or change in appetite. Does c/o fatigue. Eyes: Denies redness, blurred vision, diplopia, discharge, itchy or watery eyes.  ENT: Denies discharge, congestion, post nasal  drip, epistaxis, sore throat, earache, hearing loss, dental pain, Tinnitus, Vertigo, Sinus pain or snoring.  Cardio: Denies chest pain, palpitations, irregular heartbeat, syncope, dyspnea, diaphoresis, orthopnea, PND, claudication or edema Respiratory: denies cough, dyspnea, DOE, pleurisy, hoarseness, laryngitis or wheezing.  Gastrointestinal: Denies dysphagia, heartburn, reflux, water brash, pain, cramps, nausea, vomiting, bloating, diarrhea, constipation, hematemesis, melena, hematochezia, jaundice or hemorrhoids Genitourinary: Denies dysuria, frequency, urgency, nocturia, hesitancy, discharge, hematuria or flank pain Musculoskeletal: Denies arthralgia, myalgia, stiffness, Jt. Swelling, pain, limp or strain/sprain. Denies Falls. Skin: Denies puritis, rash, hives, warts, acne, eczema or change in skin lesion Neuro: No weakness, tremor, incoordination, spasms, paresthesia or pain Psychiatric: Denies confusion, memory loss or sensory loss. Denies Depression. Endocrine: Denies change in weight, skin, hair change, nocturia, and paresthesia, diabetic polys, visual blurring or hyper / hypo glycemic episodes.  Heme/Lymph: No excessive bleeding, bruising or enlarged lymph nodes.  Physical Exam  BP 134/72   Pulse 72   Temp (!) 97.3 F (36.3 C)   Resp 16   Ht 5\' 9"  (1.753 m)   Wt 187 lb 12.8 oz (85.2 kg)   BMI 27.73 kg/m   General Appearance: Well nourished and well groomed and in no apparent distress.  Eyes: PERRLA, EOMs, conjunctiva no swelling or erythema, normal fundi and vessels. Sinuses: No frontal/maxillary tenderness ENT/Mouth: EACs patent / TMs  nl. Nares clear without erythema, swelling, mucoid exudates. Oral hygiene is good. No erythema, swelling, or exudate. Tongue normal, non-obstructing. Tonsils not swollen or erythematous. Hearing normal.  Neck: Supple, thyroid not palpable. No bruits, nodes or JVD. Respiratory: Respiratory effort normal.  BS equal and clear bilateral without  rales, rhonci, wheezing or stridor. Cardio: Heart sounds are normal with regular rate and rhythm and no murmurs, rubs or gallops. Peripheral pulses are normal and equal bilaterally without edema. No aortic or femoral bruits. Chest: symmetric with normal excursions and percussion.  Abdomen: Soft, with Nl bowel sounds. Nontender, no guarding, rebound, hernias, masses, or organomegaly.  Lymphatics: Non tender without lymphadenopathy.  Musculoskeletal: Full ROM all peripheral extremities, joint stability, 5/5 strength, and normal gait. Skin: Warm and dry without rashes, lesions, cyanosis, clubbing or  ecchymosis.  Neuro: Cranial nerves intact, reflexes equal bilaterally. Normal muscle tone, no cerebellar symptoms. Sensation intact.  Pysch: Alert and oriented X 3 with normal affect, insight and judgment appropriate.   Assessment and Plan  1. Annual Preventative/Screening Exam   2. Essential hypertension  - EKG 12-Lead - [place], retroperitnl abd,  ltd - Urinalysis, Routine w reflex microscopic - Microalbumin / Creatinine Urine Ratio - CBC with Diff - COMPLETE METABOLIC PANEL WITH GFR - Magnesium - TSH  3. Hyperlipidemia, mixed  - EKG 12-Lead - [place], retroperitnl abd,  ltd - Lipid Profile - TSH  4. Abnormal glucose  - EKG 12-Lead - [place], retroperitnl abd,  ltd - Hemoglobin A1c ([facility]) - Insulin, random  5. Vitamin D deficiency  - Vitamin D (25 hydroxy)  6. Atherosclerosis of native coronary artery without angina  pectoris, unspecified whether native or transplanted heart  - EKG 12-Lead  7. Paroxysmal atrial fibrillation (HCC)  - EKG 12-Lead  8. Gastroesophageal reflux disease without esophagitis  - CBC with Diff  9. BPH with obstruction/lower urinary tract symptoms  - PSA  10. Screening for colorectal cancer  - POC Hemoccult Bld/Stl   11. Prostate cancer screening  - PSA  12. Screening for ischemic heart disease  - EKG 12-Lead  13. FHx: heart disease  -  EKG 12-Lead - [place], retroperitnl abd,  ltd  14. Former smoker  - EKG 12-Lead - [place], retroperitnl abd,  ltd  15. Screening for AAA (aortic abdominal aneurysm)  - [place], retroperitnl abd,  ltd  16. Medication management  - Urinalysis, Routine w reflex microscopic - Microalbumin / Creatinine Urine Ratio - CBC with Diff - COMPLETE METABOLIC PANEL WITH GFR - Magnesium - Lipid Profile - TSH - Hemoglobin A1c ([facility]) - Insulin, random - Vitamin D (25 hydroxy)  17. Exposure to COVID-19 virus  - SAR CoV2 Serology (COVID 19)AB(IGG)IA          Patient was counseled in prudent diet, weight control to achieve/maintain BMI less than 25, BP monitoring, regular exercise and medications as discussed.  Discussed med effects and SE's. Routine screening labs and tests as requested with regular follow-up as recommended. Over 40 minutes of exam, counseling, chart review and high complex critical decision making was  performed   Kirtland Bouchard, MD

## 2019-08-03 NOTE — Patient Instructions (Signed)

## 2019-08-04 ENCOUNTER — Ambulatory Visit: Payer: Medicare Other | Admitting: Internal Medicine

## 2019-08-04 ENCOUNTER — Other Ambulatory Visit: Payer: Self-pay

## 2019-08-04 VITALS — BP 134/72 | HR 72 | Temp 97.3°F | Resp 16 | Ht 69.0 in | Wt 187.8 lb

## 2019-08-04 DIAGNOSIS — Z0000 Encounter for general adult medical examination without abnormal findings: Secondary | ICD-10-CM | POA: Diagnosis not present

## 2019-08-04 DIAGNOSIS — R7309 Other abnormal glucose: Secondary | ICD-10-CM

## 2019-08-04 DIAGNOSIS — Z8249 Family history of ischemic heart disease and other diseases of the circulatory system: Secondary | ICD-10-CM

## 2019-08-04 DIAGNOSIS — I480 Paroxysmal atrial fibrillation: Secondary | ICD-10-CM

## 2019-08-04 DIAGNOSIS — Z125 Encounter for screening for malignant neoplasm of prostate: Secondary | ICD-10-CM

## 2019-08-04 DIAGNOSIS — N138 Other obstructive and reflux uropathy: Secondary | ICD-10-CM

## 2019-08-04 DIAGNOSIS — I2510 Atherosclerotic heart disease of native coronary artery without angina pectoris: Secondary | ICD-10-CM

## 2019-08-04 DIAGNOSIS — Z0001 Encounter for general adult medical examination with abnormal findings: Secondary | ICD-10-CM

## 2019-08-04 DIAGNOSIS — E559 Vitamin D deficiency, unspecified: Secondary | ICD-10-CM

## 2019-08-04 DIAGNOSIS — K219 Gastro-esophageal reflux disease without esophagitis: Secondary | ICD-10-CM

## 2019-08-04 DIAGNOSIS — Z87891 Personal history of nicotine dependence: Secondary | ICD-10-CM | POA: Diagnosis not present

## 2019-08-04 DIAGNOSIS — E782 Mixed hyperlipidemia: Secondary | ICD-10-CM

## 2019-08-04 DIAGNOSIS — I10 Essential (primary) hypertension: Secondary | ICD-10-CM

## 2019-08-04 DIAGNOSIS — Z79899 Other long term (current) drug therapy: Secondary | ICD-10-CM

## 2019-08-04 DIAGNOSIS — Z136 Encounter for screening for cardiovascular disorders: Secondary | ICD-10-CM

## 2019-08-04 DIAGNOSIS — Z1211 Encounter for screening for malignant neoplasm of colon: Secondary | ICD-10-CM

## 2019-08-04 DIAGNOSIS — Z20822 Contact with and (suspected) exposure to covid-19: Secondary | ICD-10-CM

## 2019-08-05 LAB — URINALYSIS, ROUTINE W REFLEX MICROSCOPIC
Bacteria, UA: NONE SEEN /HPF
Bilirubin Urine: NEGATIVE
Glucose, UA: NEGATIVE
Hgb urine dipstick: NEGATIVE
Hyaline Cast: NONE SEEN /LPF
Nitrite: NEGATIVE
Specific Gravity, Urine: 1.027 (ref 1.001–1.03)
Squamous Epithelial / HPF: NONE SEEN /HPF (ref <=5)
pH: 6.5 (ref 5.0–8.0)

## 2019-08-05 LAB — COMPLETE METABOLIC PANEL WITH GFR
AG Ratio: 2.5 (calc) (ref 1.0–2.5)
ALT: 30 U/L (ref 9–46)
AST: 20 U/L (ref 10–35)
Albumin: 4.5 g/dL (ref 3.6–5.1)
Alkaline phosphatase (APISO): 49 U/L (ref 35–144)
BUN: 11 mg/dL (ref 7–25)
CO2: 28 mmol/L (ref 20–32)
Calcium: 9.6 mg/dL (ref 8.6–10.3)
Chloride: 108 mmol/L (ref 98–110)
Creat: 0.88 mg/dL (ref 0.70–1.18)
GFR, Est African American: 98 mL/min/{1.73_m2} (ref >=60)
GFR, Est Non African American: 85 mL/min/{1.73_m2} (ref >=60)
Globulin: 1.8 g/dL (calc) — ABNORMAL LOW (ref 1.9–3.7)
Glucose, Bld: 93 mg/dL (ref 65–99)
Potassium: 3.9 mmol/L (ref 3.5–5.3)
Sodium: 144 mmol/L (ref 135–146)
Total Bilirubin: 2 mg/dL — ABNORMAL HIGH (ref 0.2–1.2)
Total Protein: 6.3 g/dL (ref 6.1–8.1)

## 2019-08-05 LAB — MAGNESIUM: Magnesium: 2.2 mg/dL (ref 1.5–2.5)

## 2019-08-05 LAB — TSH: TSH: 0.63 mIU/L (ref 0.40–4.50)

## 2019-08-05 LAB — CBC WITH DIFFERENTIAL/PLATELET
Absolute Monocytes: 538 cells/uL (ref 200–950)
Basophils Absolute: 58 cells/uL (ref 0–200)
Basophils Relative: 0.9 %
Eosinophils Absolute: 160 cells/uL (ref 15–500)
Eosinophils Relative: 2.5 %
HCT: 44.8 % (ref 38.5–50.0)
Hemoglobin: 15.1 g/dL (ref 13.2–17.1)
Lymphs Abs: 1613 cells/uL (ref 850–3900)
MCH: 30 pg (ref 27.0–33.0)
MCHC: 33.7 g/dL (ref 32.0–36.0)
MCV: 89.1 fL (ref 80.0–100.0)
MPV: 10.1 fL (ref 7.5–12.5)
Monocytes Relative: 8.4 %
Neutro Abs: 4032 cells/uL (ref 1500–7800)
Neutrophils Relative %: 63 %
Platelets: 216 10*3/uL (ref 140–400)
RBC: 5.03 10*6/uL (ref 4.20–5.80)
RDW: 12.2 % (ref 11.0–15.0)
Total Lymphocyte: 25.2 %
WBC: 6.4 10*3/uL (ref 3.8–10.8)

## 2019-08-05 LAB — LIPID PANEL
Cholesterol: 108 mg/dL (ref <200)
HDL: 48 mg/dL (ref >=40)
LDL Cholesterol (Calc): 45 mg/dL (calc)
Non-HDL Cholesterol (Calc): 60 mg/dL (calc) (ref <130)
Total CHOL/HDL Ratio: 2.3 (calc) (ref <5.0)
Triglycerides: 70 mg/dL (ref <150)

## 2019-08-05 LAB — INSULIN, RANDOM: Insulin: 7.1 u[IU]/mL

## 2019-08-05 LAB — HEMOGLOBIN A1C
Hgb A1c MFr Bld: 5.5 % of total Hgb (ref <5.7)
Mean Plasma Glucose: 111 (calc)
eAG (mmol/L): 6.2 (calc)

## 2019-08-05 LAB — PSA: PSA: 0.6 ng/mL (ref <=4.0)

## 2019-08-05 LAB — VITAMIN D 25 HYDROXY (VIT D DEFICIENCY, FRACTURES): Vit D, 25-Hydroxy: 94 ng/mL (ref 30–100)

## 2019-08-05 LAB — MICROALBUMIN / CREATININE URINE RATIO
Creatinine, Urine: 366 mg/dL — ABNORMAL HIGH (ref 20–320)
Microalb Creat Ratio: 4 mcg/mg creat (ref <30)
Microalb, Ur: 1.4 mg/dL

## 2019-08-05 LAB — SAR COV2 SEROLOGY (COVID19)AB(IGG),IA: SARS CoV2 AB IGG: NEGATIVE

## 2019-08-28 ENCOUNTER — Ambulatory Visit: Payer: Medicare Other

## 2019-09-05 NOTE — Progress Notes (Signed)
Cardiology Office Note    Date:  09/06/2019   ID:  Damon [name], DOB 1946/06/26, MRN [id number]  PCP:  [name], MD  Cardiologist: Lauree Chandler, MD EPS: [name], MD  Chief Complaint  Patient presents with  . Follow-up    History of Present Illness:  Damon [name] is a 74 y.o. male with history of CAD status post rotational arthrectomy of the proximal circumflex followed by DES of the circumflex 2015 FFR of the LAD was not flow-limiting.  No ischemia on stress test 2017, normal LV systolic function Q000111Q echo.Also has hypertension, HLD, PAD, PAF status post ablation 2014, redo 2016, patient went back into atrial fibrillation sometime after colonoscopy.  He underwent successful TEE DCCV 07/04/2019 because he was off anticoagulation for 2 days prior to his colonoscopy.  Last saw Dr. Angelena Form 08/2018 and crestor increased. LDL 45 08/04/19  Patient comes in for yearly f/u. Denies chest pain. Has occasional palpitations. After TEE he had esophagus pain and still having some stomach problems. Not taking Xarelto with food. Was walking 5 miles daily in January but not with recent weather. No problems doing that. Getting second covid19 vaccine on Thurs.   Past Medical History:  Diagnosis Date  . Bladder cancer ([place])   . Hearing loss of both ears   . History of colon polyps    BENIGN  . Hyperlipidemia   . Hypertension   . Mitral regurgitation   . OSA on CPAP    cpap settign of 13  . PAD (peripheral artery disease) (H[facility])    ABI--  RIGHT SIDE NORMAL LEFT SIDE MODERATELY REDUCED W/ DISTAL LEFT SFA STENOSIS//  MILD CALDICATION  . PAF (paroxysmal atrial fibrillation) ([place])     a. s/p PVI 2014; b. s/p redo PVI and CTI 01/2015  . Pre-diabetes   . Vitamin D deficiency     Past Surgical History:  Procedure Laterality Date  . ABLATION OF DYSRHYTHMIC FOCUS  01/26/2015  . ATRIAL FIBRILLATION ABLATION N/A 07/27/2012   Procedure: ATRIAL FIBRILLATION ABLATION;   Surgeon: [name], MD;  Location: [facility] CATH LAB;  Service: Cardiovascular;  Laterality: N/A;  . ATRIAL FIBRILLATION ABLATION N/A 08/25/2018   Procedure: ATRIAL FIBRILLATION ABLATION;  Surgeon: [name], MD;  Location: [place] CV LAB;  Service: Cardiovascular;  Laterality: N/A;  . CARDIAC ELECTROPHYSIOLOGY STUDY AND ABLATION  07-27-2012  DR ALLRED   SUCCESSFUL ABLATION OF A-FIB  . CARDIOVERSION  08/06/2012   Procedure: CARDIOVERSION;  Surgeon: [name], MD;  Location: [facility] ENDOSCOPY;  Service: Cardiovascular;  Laterality: N/A;  . CARDIOVERSION N/A 08/25/2014   Procedure: CARDIOVERSION;  Surgeon: Larey Dresser, MD;  Location: [place];  Service: Cardiovascular;  Laterality: N/A;  . CARDIOVERSION N/A 11/13/2014   Procedure: CARDIOVERSION;  Surgeon: Sueanne Margarita, MD;  Location: [facility] ENDOSCOPY;  Service: Cardiovascular;  Laterality: N/A;  . CARDIOVERSION N/A 12/28/2014   Procedure: CARDIOVERSION;  Surgeon: [name], MD;  Location: [place];  Service: Cardiovascular;  Laterality: N/A;  . CARDIOVERSION N/A 01/30/2017   Procedure: CARDIOVERSION;  Surgeon: Josue Hector, MD;  Location: [facility] ENDOSCOPY;  Service: Cardiovascular;  Laterality: N/A;  . CARDIOVERSION N/A 04/06/2017   Procedure: CARDIOVERSION;  Surgeon: Dorothy Spark, MD;  Location: [facility] ENDOSCOPY;  Service: Cardiovascular;  Laterality: N/A;  . CARDIOVERSION N/A 05/25/2018   Procedure: CARDIOVERSION;  Surgeon: Skeet Latch, MD;  Location: [facility] ENDOSCOPY;  Service: Cardiovascular;  Laterality: N/A;  . CARDIOVERSION N/A 08/09/2018   Procedure: CARDIOVERSION;  Surgeon: Buford Dresser, MD;  Location: [facility] ENDOSCOPY;  Service: Cardiovascular;  Laterality: N/A;  . CARDIOVERSION N/A 07/04/2019   Procedure: CARDIOVERSION;  Surgeon: Dorothy Spark, MD;  Location: [place];  Service: Cardiovascular;  Laterality: N/A;  . CATARACT EXTRACTION W/ INTRAOCULAR LENS  IMPLANT, BILATERAL    . COLONOSCOPY WITH PROPOFOL N/A  02/13/2015   Procedure: COLONOSCOPY WITH PROPOFOL;  Surgeon: Garlan Fair, MD;  Location: [facility] ENDOSCOPY;  Service: Endoscopy;  Laterality: N/A;  . CYSTOSCOPY W/ RETROGRADES Bilateral 04/12/2013   Procedure: CYSTOSCOPY WITH RETROGRADE PYELOGRAM;  Surgeon: [name], MD;  Location: [facility];  Service: Urology;  Laterality: Bilateral;  . ELECTROPHYSIOLOGIC STUDY N/A 01/26/2015   Procedure: Atrial Fibrillation Ablation;  Surgeon: [name], MD;  Location: [place] CV LAB;  Service: Cardiovascular;  Laterality: N/A;  . LEFT HEART CATHETERIZATION WITH CORONARY ANGIOGRAM N/A 11/24/2013   Procedure: LEFT HEART CATHETERIZATION WITH CORONARY ANGIOGRAM;  Surgeon: [name], MD;  Location: [facility] CATH LAB;  Service: Cardiovascular;  Laterality: N/A;  . PERCUTANEOUS CORONARY ROTOBLATOR INTERVENTION (PCI-R) N/A 11/28/2013   Procedure: PERCUTANEOUS CORONARY ROTOBLATOR INTERVENTION (PCI-R);  Surgeon: [name], MD;  Location: [facility] CATH LAB;  Service: Cardiovascular;  Laterality: N/A;  . PERCUTANEOUS CORONARY STENT INTERVENTION (PCI-S)  11/24/2013   Procedure: PERCUTANEOUS CORONARY STENT INTERVENTION (PCI-S);  Surgeon: [name], MD;  Location: [facility] CATH LAB;  Service: Cardiovascular;;  . TEE WITHOUT CARDIOVERSION  07/26/2012   Procedure: TRANSESOPHAGEAL ECHOCARDIOGRAM (TEE);  Surgeon: [name], MD;  Location: [facility] ENDOSCOPY;  Service: Cardiovascular;  Laterality: N/A;  . TEE WITHOUT CARDIOVERSION N/A 08/25/2014   Procedure: TRANSESOPHAGEAL ECHOCARDIOGRAM (TEE);  Surgeon: Larey Dresser, MD;  Location: [place];  Service: Cardiovascular;  Laterality: N/A;  . TEE WITHOUT CARDIOVERSION  01/26/2015   Procedure: Transesophageal Echocardiogram (Tee);  Surgeon: [name], MD;  Location: [place] CV LAB;  Service: Cardiovascular;;  . TEE WITHOUT CARDIOVERSION N/A 07/04/2019   Procedure: TRANSESOPHAGEAL ECHOCARDIOGRAM (TEE);  Surgeon: Dorothy Spark, MD;   Location: [facility] ENDOSCOPY;  Service: Cardiovascular;  Laterality: N/A;  . TONSILLECTOMY  age 89  . TOTAL HIP ARTHROPLASTY  07/25/2011   Procedure: TOTAL HIP ARTHROPLASTY ANTERIOR APPROACH;  Surgeon: Mcarthur Rossetti;  Location: [facility] ORS;  Service: Orthopedics;  Laterality: Right;  . TOTAL HIP ARTHROPLASTY Left 02-08-2010  . TRANSURETHRAL RESECTION OF BLADDER TUMOR WITH GYRUS (TURBT-GYRUS) N/A 04/12/2013   Procedure: TRANSURETHRAL RESECTION OF BLADDER TUMOR WITH GYRUS (TURBT-GYRUS);  Surgeon: [name], MD;  Location: [facility];  Service: Urology;  Laterality: N/A;    Current Medications: Current Meds  Medication Sig  . bisoprolol-hydrochlorothiazide (ZIAC) 10-6.25 MG tablet Take 1 tablet Daily for BP  . Cholecalciferol (VITAMIN D3) 125 MCG (5000 UT) CAPS Take 10,000 units Daily  . diltiazem (CARTIA XT) 180 MG 24 hr capsule Take 1 capsule by mouth once daily  . ezetimibe (ZETIA) 10 MG tablet Take 1 tablet Daily for Cholesterol  . Multiple Vitamin (MULTIVITAMIN WITH MINERALS) TABS tablet Take 1 tablet Daily  . nitroGLYCERIN (NITROSTAT) 0.4 MG SL tablet DISSOLVE ONE TABLET UNDER THE TONGUE EVERY 5 MINUTES AS NEEDED FOR CHEST PAIN.  DO NOT EXCEED A TOTAL OF 3 DOSES IN 15 MINUTES  . ondansetron (ZOFRAN ODT) 8 MG disintegrating tablet Take 1 tablet (8 mg total) by mouth every 8 (eight) hours as needed for nausea or vomiting.  . Polyvinyl Alcohol-Povidone (REFRESH OP) Place 1 drop into both eyes daily as needed (dry eyes).  Marland Kitchen  rivaroxaban (XARELTO) 20 MG TABS tablet TAKE 1 TABLET BY MOUTH ONCE DAILY WITH SUPPER  . rosuvastatin (CRESTOR) 40 MG tablet Take 1 tablet Daily for Cholesterol  . zinc gluconate 50 MG tablet Take 50 mg by mouth daily.     Allergies:   Patient has no known allergies.   Social History   Socioeconomic History  . Marital status: Married    Spouse name: Not on file  . Number of children: Not on file  . Years of education: Not on file  . Highest  education level: Not on file  Occupational History  . Not on file  Tobacco Use  . Smoking status: Former Smoker    Packs/day: 2.00    Years: 10.00    Pack years: 20.00    Types: Cigarettes    Quit date: 09/20/1981    Years since quitting: 37.9  . Smokeless tobacco: Never Used  Substance and Sexual Activity  . Alcohol use: No    Comment: pt states he has stopped drinking alcohol  . Drug use: No  . Sexual activity: Not on file  Other Topics Concern  . Not on file  Social History Narrative  . Not on file   Social Determinants of Health   Financial Resource Strain:   . Difficulty of Paying Living Expenses: Not on file  Food Insecurity:   . Worried About Charity fundraiser in the Last Year: Not on file  . Ran Out of Food in the Last Year: Not on file  Transportation Needs:   . Lack of Transportation (Medical): Not on file  . Lack of Transportation (Non-Medical): Not on file  Physical Activity:   . Days of Exercise per Week: Not on file  . Minutes of Exercise per Session: Not on file  Stress:   . Feeling of Stress : Not on file  Social Connections:   . Frequency of Communication with Friends and Family: Not on file  . Frequency of Social Gatherings with Friends and Family: Not on file  . Attends Religious Services: Not on file  . Active Member of Clubs or Organizations: Not on file  . Attends Archivist Meetings: Not on file  . Marital Status: Not on file     Family History:  The patient's family history includes Heart attack in his mother; Heart disease in his father and mother; Heart failure in his father.   ROS:   Please see the history of present illness.    ROS All other systems reviewed and are negative.   PHYSICAL EXAM:   VS:  BP 110/60   Pulse 60   Ht 5\' 9"  (1.753 m)   Wt 191 lb 7.2 oz (86.8 kg)   SpO2 95%   BMI 28.27 kg/m   Physical Exam  GEN: Well nourished, well developed, in no acute distress  Neck: no JVD, carotid bruits, or  masses Cardiac:RRR; no murmurs, rubs, or gallops  Respiratory:  clear to auscultation bilaterally, normal work of breathing GI: soft, nontender, nondistended, + BS Ext: without cyanosis, clubbing, or edema, Good distal pulses bilaterally Neuro:  Alert and Oriented x 3  Psych: euthymic mood, full affect  Wt Readings from Last 3 Encounters:  09/06/19 191 lb 7.2 oz (86.8 kg)  08/04/19 187 lb 12.8 oz (85.2 kg)  07/11/19 193 lb 12.8 oz (87.9 kg)      Studies/Labs Reviewed:   EKG:  EKG is not ordered today.   Recent Labs: 08/04/2019: ALT 30; BUN 11;  Creat 0.88; Hemoglobin 15.1; Magnesium 2.2; Platelets 216; Potassium 3.9; Sodium 144; TSH 0.63   Lipid Panel    Component Value Date/Time   CHOL 108 08/04/2019 1507   TRIG 70 08/04/2019 1507   HDL 48 08/04/2019 1507   CHOLHDL 2.3 08/04/2019 1507   VLDL 19 01/15/2017 0932   LDLCALC 45 08/04/2019 1507    Additional studies/ records that were reviewed today include:  TEE 12/14/20IMPRESSIONS     1. Left ventricular ejection fraction, by visual estimation, is 55 to  60%. The left ventricle has normal function. Normal left ventricular size.  Left ventricular septal wall thickness was normal. There is no left  ventricular hypertrophy.   2. Left ventricular diastolic function could not be evaluated.   3. The left ventricle has no regional wall motion abnormalities.   4. Global right ventricle has normal systolic function.The right  ventricular size is normal. No increase in right ventricular wall  thickness.   5. Left atrial size was normal.   6. No thrombus in the left atrium or left atrial appendage.   7. Right atrial size was normal.   8. The mitral valve is normal in structure. Mild mitral valve  regurgitation. No evidence of mitral stenosis.   9. The tricuspid valve is normal in structure. Tricuspid valve  regurgitation is mild.  10. The aortic valve is normal in structure. Aortic valve regurgitation is  not visualized. No  evidence of aortic valve sclerosis or stenosis.  11. The pulmonic valve was normal in structure. Pulmonic valve  regurgitation is not visualized.  12. The inferior vena cava is normal in size with greater than 50%  respiratory variability, suggesting right atrial pressure of 3 mmHg.  13. No intracardiac source of embolism. This study was followed by a  successful cardioversion.      ASSESSMENT:    1. Coronary artery disease involving native coronary artery of native heart without angina pectoris   2. Paroxysmal atrial fibrillation (HCC)   3. Essential hypertension   4. Hyperlipidemia, unspecified hyperlipidemia type      PLAN:  In order of problems listed above:  CAD status post rotational arthrectomy of the proximal circumflex followed by DES of the circumflex 2015 moderate LAD stenosis,  FFR of the LAD was not flow-limiting 11/2013 .  No ischemia on stress test 2017, normal LV systolic function [id number] echo.on statin and beta blocker. No ASA because he's on Xarelto.Doing well without angina. Gets regular exercise. F/u with Dr. Angelena Form in 1 yr.  PAF with previous ablation, TEE DCCV 06/2019 followed by Dr. [name] on [place]. Labs stable 08/04/19  HTN BP well controlled.   HLD Crestor increased LDL 45 08/04/19  Medication Adjustments/Labs and Tests Ordered: Current medicines are reviewed at length with the patient today.  Concerns regarding medicines are outlined above.  Medication changes, Labs and Tests ordered today are listed in the Patient Instructions below. Patient Instructions  Medication Instructions:  Your physician recommends that you continue on your current medications as directed. Please refer to the Current Medication list given to you today.  *If you need a refill on your cardiac medications before your next appointment, please call your pharmacy*  Lab Work: None ordered  If you have labs (blood work) drawn today and your tests are completely normal, you will  receive your results only by: Marland Kitchen MyChart Message (if you have MyChart) OR . A paper copy in the mail If you have any lab test that is abnormal or we need  to change your treatment, we will call you to review the results.  Testing/Procedures: None ordered  Follow-Up: At [facility], you and your health needs are our priority.  As part of our continuing mission to provide you with exceptional heart care, we have created designated Provider Care Teams.  These Care Teams include your primary Cardiologist (physician) and Advanced Practice Providers (APPs -  Physician Assistants and Nurse Practitioners) who all work together to provide you with the care you need, when you need it.  Your next appointment:   12 month(s)  The format for your next appointment:   In Person  Provider:   You may see Lauree Chandler, MD or one of the following Advanced Practice Providers on your designated Care Team:    Melina Copa, PA-C  Ermalinda Barrios, PA-C   Other Instructions      Signed, Ermalinda Barrios, PA-C  09/06/2019 10:32 AM    [address], [place], [place]  [id number] Phone: [phone]; Fax: [phone]

## 2019-09-06 ENCOUNTER — Ambulatory Visit (INDEPENDENT_AMBULATORY_CARE_PROVIDER_SITE_OTHER): Payer: Medicare Other | Admitting: Physician Assistant

## 2019-09-06 ENCOUNTER — Other Ambulatory Visit: Payer: Self-pay

## 2019-09-06 ENCOUNTER — Encounter: Payer: Self-pay | Admitting: Physician Assistant

## 2019-09-06 VITALS — BP 110/60 | HR 60 | Ht 69.0 in | Wt 191.4 lb

## 2019-09-06 DIAGNOSIS — I2510 Atherosclerotic heart disease of native coronary artery without angina pectoris: Secondary | ICD-10-CM

## 2019-09-06 DIAGNOSIS — E785 Hyperlipidemia, unspecified: Secondary | ICD-10-CM | POA: Diagnosis not present

## 2019-09-06 DIAGNOSIS — I10 Essential (primary) hypertension: Secondary | ICD-10-CM

## 2019-09-06 DIAGNOSIS — I480 Paroxysmal atrial fibrillation: Secondary | ICD-10-CM

## 2019-09-06 NOTE — Patient Instructions (Signed)
Medication Instructions:  Your physician recommends that you continue on your current medications as directed. Please refer to the Current Medication list given to you today.  *If you need a refill on your cardiac medications before your next appointment, please call your pharmacy*  Lab Work: None ordered  If you have labs (blood work) drawn today and your tests are completely normal, you will receive your results only by: Marland Kitchen MyChart Message (if you have MyChart) OR . A paper copy in the mail If you have any lab test that is abnormal or we need to change your treatment, we will call you to review the results.  Testing/Procedures: None ordered  Follow-Up: At [facility], you and your health needs are our priority.  As part of our continuing mission to provide you with exceptional heart care, we have created designated Provider Care Teams.  These Care Teams include your primary Cardiologist (physician) and Advanced Practice Providers (APPs -  Physician Assistants and Nurse Practitioners) who all work together to provide you with the care you need, when you need it.  Your next appointment:   12 month(s)  The format for your next appointment:   In Person  Provider:   You may see Lauree Chandler, MD or one of the following Advanced Practice Providers on your designated Care Team:    Melina Copa, PA-C  Ermalinda Barrios, PA-C   Other Instructions

## 2019-09-14 ENCOUNTER — Ambulatory Visit: Payer: Medicare Other

## 2019-09-16 ENCOUNTER — Telehealth (HOSPITAL_COMMUNITY): Payer: Self-pay | Admitting: *Deleted

## 2019-09-16 ENCOUNTER — Other Ambulatory Visit (HOSPITAL_COMMUNITY): Payer: Self-pay | Admitting: *Deleted

## 2019-09-16 MED ORDER — METOPROLOL TARTRATE 25 MG PO TABS: 12.5000 mg | ORAL_TABLET | Freq: Two times a day (BID) | ORAL | 1 refills | Status: DC

## 2019-09-16 NOTE — Telephone Encounter (Signed)
Patient called in with complaint of going into AF this morning HR 105-110. Had covid vaccine  Yesterday. Pt is currently out of town. Discussed with Roderic Palau NP - will add low dose metoprolol tartrate 12.5mg  BID and see if this will convert him. If he returns to normal rhythm he will stop metoprolol. If still out of rhythm on Monday will call office to be seen. Pt in agreement.

## 2019-09-19 ENCOUNTER — Other Ambulatory Visit: Payer: Self-pay

## 2019-09-19 ENCOUNTER — Ambulatory Visit (HOSPITAL_COMMUNITY)
Admission: RE | Admit: 2019-09-19 | Discharge: 2019-09-19 | Disposition: A | Discharge status: Home / Self Care | Payer: Medicare Other | Source: Ambulatory Visit | Attending: Nurse Practitioner | Admitting: Nurse Practitioner

## 2019-09-19 ENCOUNTER — Encounter (HOSPITAL_COMMUNITY): Payer: Self-pay | Admitting: Nurse Practitioner

## 2019-09-19 VITALS — BP 110/66 | HR 87 | Ht 69.0 in | Wt 188.8 lb

## 2019-09-19 DIAGNOSIS — G4733 Obstructive sleep apnea (adult) (pediatric): Secondary | ICD-10-CM | POA: Diagnosis not present

## 2019-09-19 DIAGNOSIS — Z955 Presence of coronary angioplasty implant and graft: Secondary | ICD-10-CM | POA: Diagnosis not present

## 2019-09-19 DIAGNOSIS — D6869 Other thrombophilia: Secondary | ICD-10-CM | POA: Diagnosis not present

## 2019-09-19 DIAGNOSIS — E785 Hyperlipidemia, unspecified: Secondary | ICD-10-CM | POA: Diagnosis not present

## 2019-09-19 DIAGNOSIS — Z96643 Presence of artificial hip joint, bilateral: Secondary | ICD-10-CM | POA: Insufficient documentation

## 2019-09-19 DIAGNOSIS — I10 Essential (primary) hypertension: Secondary | ICD-10-CM | POA: Insufficient documentation

## 2019-09-19 DIAGNOSIS — Z79899 Other long term (current) drug therapy: Secondary | ICD-10-CM | POA: Insufficient documentation

## 2019-09-19 DIAGNOSIS — Z7901 Long term (current) use of anticoagulants: Secondary | ICD-10-CM | POA: Insufficient documentation

## 2019-09-19 DIAGNOSIS — Z8551 Personal history of malignant neoplasm of bladder: Secondary | ICD-10-CM | POA: Insufficient documentation

## 2019-09-19 DIAGNOSIS — E559 Vitamin D deficiency, unspecified: Secondary | ICD-10-CM | POA: Diagnosis not present

## 2019-09-19 DIAGNOSIS — I480 Paroxysmal atrial fibrillation: Secondary | ICD-10-CM | POA: Insufficient documentation

## 2019-09-19 DIAGNOSIS — I4891 Unspecified atrial fibrillation: Secondary | ICD-10-CM | POA: Diagnosis present

## 2019-09-19 DIAGNOSIS — Z9841 Cataract extraction status, right eye: Secondary | ICD-10-CM | POA: Insufficient documentation

## 2019-09-19 DIAGNOSIS — Z87891 Personal history of nicotine dependence: Secondary | ICD-10-CM | POA: Insufficient documentation

## 2019-09-19 DIAGNOSIS — Z961 Presence of intraocular lens: Secondary | ICD-10-CM | POA: Diagnosis not present

## 2019-09-19 DIAGNOSIS — H9193 Unspecified hearing loss, bilateral: Secondary | ICD-10-CM | POA: Diagnosis not present

## 2019-09-19 DIAGNOSIS — Z8249 Family history of ischemic heart disease and other diseases of the circulatory system: Secondary | ICD-10-CM | POA: Diagnosis not present

## 2019-09-19 DIAGNOSIS — Z9842 Cataract extraction status, left eye: Secondary | ICD-10-CM | POA: Diagnosis not present

## 2019-09-19 DIAGNOSIS — I739 Peripheral vascular disease, unspecified: Secondary | ICD-10-CM | POA: Diagnosis not present

## 2019-09-19 LAB — CBC
HCT: 45.3 % (ref 39.0–52.0)
Hemoglobin: 15.3 g/dL (ref 13.0–17.0)
MCH: 30.7 pg (ref 26.0–34.0)
MCHC: 33.8 g/dL (ref 30.0–36.0)
MCV: 91 fL (ref 80.0–100.0)
Platelets: 209 10*3/uL (ref 150–400)
RBC: 4.98 MIL/uL (ref 4.22–5.81)
RDW: 12.9 % (ref 11.5–15.5)
WBC: 5.8 10*3/uL (ref 4.0–10.5)
nRBC: 0 % (ref 0.0–0.2)

## 2019-09-19 LAB — BASIC METABOLIC PANEL
Anion gap: 11 (ref 5–15)
BUN: 9 mg/dL (ref 8–23)
CO2: 22 mmol/L (ref 22–32)
Calcium: 9.1 mg/dL (ref 8.9–10.3)
Chloride: 108 mmol/L (ref 98–111)
Creatinine, Ser: 0.73 mg/dL (ref 0.61–1.24)
GFR calc Af Amer: >60 mL/min (ref >60)
GFR calc non Af Amer: >60 mL/min (ref >60)
Glucose, Bld: 103 mg/dL — ABNORMAL HIGH (ref 70–99)
Potassium: 4.1 mmol/L (ref 3.5–5.1)
Sodium: 141 mmol/L (ref 135–145)

## 2019-09-19 NOTE — Telephone Encounter (Signed)
Pt still out of rhythm today - will bring in for assessment.

## 2019-09-19 NOTE — H&P (View-Only) (Signed)
Primary Care Physician: [name], MD Referring Physician: [name] is a 74 y.o. male with a h/o paroxysmal afib that is in the clinic for going back into afib sometime over the last couple of days.He had an colonoscopy last Friday which may have been  the trigger. He came off anticoagulation for 2 days prior. He is rate controled today.  F/u in afib clinic, 07/11/19, after successful  cardioversion and continues in SR. He c/o of some burning/nausea after the cardioversion but states it was actually going on before then as he had stopped Vit C thinking he was not tolerating. He has seen his PCP and was started on PPI and symptoms  are  improving.   F/u in afib clinic, 09/19/19, as pt has gone back into  afib and is requesting cardioversion.He had successful cardioversion in December 2020. He feels the trigger was the covid shot. The afib started the next day after the shot. He is rate controlled. He has had 3 ablations in the past. He was out of town when he went into afib. Low dose BB was added but he remains in afib   Today, he denies symptoms of palpitations, chest pain, shortness of breath, orthopnea, PND, lower extremity edema, dizziness, presyncope, syncope, or neurologic sequela. The patient is tolerating medications without difficulties and is otherwise without complaint today.   Past Medical History:  Diagnosis Date  . Bladder cancer ([place])   . Hearing loss of both ears   . History of colon polyps    BENIGN  . Hyperlipidemia   . Hypertension   . Mitral regurgitation   . OSA on CPAP    cpap settign of 13  . PAD (peripheral artery disease) (HCC)    ABI--  RIGHT SIDE NORMAL LEFT SIDE MODERATELY REDUCED W/ DISTAL LEFT SFA STENOSIS//  MILD CALDICATION  . PAF (paroxysmal atrial fibrillation) ([place])     a. s/p PVI 2014; b. s/p redo PVI and CTI 01/2015  . Pre-diabetes   . Vitamin D deficiency    Past Surgical History:  Procedure Laterality Date  . ABLATION  OF DYSRHYTHMIC FOCUS  01/26/2015  . ATRIAL FIBRILLATION ABLATION N/A 07/27/2012   Procedure: ATRIAL FIBRILLATION ABLATION;  Surgeon: [name], MD;  Location: [facility] CATH LAB;  Service: Cardiovascular;  Laterality: N/A;  . ATRIAL FIBRILLATION ABLATION N/A 08/25/2018   Procedure: ATRIAL FIBRILLATION ABLATION;  Surgeon: [name], MD;  Location: [place] CV LAB;  Service: Cardiovascular;  Laterality: N/A;  . CARDIAC ELECTROPHYSIOLOGY STUDY AND ABLATION  07-27-2012  DR ALLRED   SUCCESSFUL ABLATION OF A-FIB  . CARDIOVERSION  08/06/2012   Procedure: CARDIOVERSION;  Surgeon: [name], MD;  Location: [facility] ENDOSCOPY;  Service: Cardiovascular;  Laterality: N/A;  . CARDIOVERSION N/A 08/25/2014   Procedure: CARDIOVERSION;  Surgeon: Larey Dresser, MD;  Location: [place];  Service: Cardiovascular;  Laterality: N/A;  . CARDIOVERSION N/A 11/13/2014   Procedure: CARDIOVERSION;  Surgeon: Sueanne Margarita, MD;  Location: [facility] ENDOSCOPY;  Service: Cardiovascular;  Laterality: N/A;  . CARDIOVERSION N/A 12/28/2014   Procedure: CARDIOVERSION;  Surgeon: [name], MD;  Location: [facility] ENDOSCOPY;  Service: Cardiovascular;  Laterality: N/A;  . CARDIOVERSION N/A 01/30/2017   Procedure: CARDIOVERSION;  Surgeon: Josue Hector, MD;  Location: [facility] ENDOSCOPY;  Service: Cardiovascular;  Laterality: N/A;  . CARDIOVERSION N/A 04/06/2017   Procedure: CARDIOVERSION;  Surgeon: Dorothy Spark, MD;  Location: [place];  Service: Cardiovascular;  Laterality: N/A;  .  CARDIOVERSION N/A 05/25/2018   Procedure: CARDIOVERSION;  Surgeon: Skeet Latch, MD;  Location: [facility] ENDOSCOPY;  Service: Cardiovascular;  Laterality: N/A;  . CARDIOVERSION N/A 08/09/2018   Procedure: CARDIOVERSION;  Surgeon: Buford Dresser, MD;  Location: [facility] ENDOSCOPY;  Service: Cardiovascular;  Laterality: N/A;  . CARDIOVERSION N/A 07/04/2019   Procedure: CARDIOVERSION;  Surgeon: Dorothy Spark, MD;  Location: [place];  Service:  Cardiovascular;  Laterality: N/A;  . CATARACT EXTRACTION W/ INTRAOCULAR LENS  IMPLANT, BILATERAL    . COLONOSCOPY WITH PROPOFOL N/A 02/13/2015   Procedure: COLONOSCOPY WITH PROPOFOL;  Surgeon: Garlan Fair, MD;  Location: [facility] ENDOSCOPY;  Service: Endoscopy;  Laterality: N/A;  . CYSTOSCOPY W/ RETROGRADES Bilateral 04/12/2013   Procedure: CYSTOSCOPY WITH RETROGRADE PYELOGRAM;  Surgeon: [name], MD;  Location: [facility];  Service: Urology;  Laterality: Bilateral;  . ELECTROPHYSIOLOGIC STUDY N/A 01/26/2015   Procedure: Atrial Fibrillation Ablation;  Surgeon: [name], MD;  Location: [place] CV LAB;  Service: Cardiovascular;  Laterality: N/A;  . LEFT HEART CATHETERIZATION WITH CORONARY ANGIOGRAM N/A 11/24/2013   Procedure: LEFT HEART CATHETERIZATION WITH CORONARY ANGIOGRAM;  Surgeon: [name], MD;  Location: [facility] CATH LAB;  Service: Cardiovascular;  Laterality: N/A;  . PERCUTANEOUS CORONARY ROTOBLATOR INTERVENTION (PCI-R) N/A 11/28/2013   Procedure: PERCUTANEOUS CORONARY ROTOBLATOR INTERVENTION (PCI-R);  Surgeon: [name], MD;  Location: [facility] CATH LAB;  Service: Cardiovascular;  Laterality: N/A;  . PERCUTANEOUS CORONARY STENT INTERVENTION (PCI-S)  11/24/2013   Procedure: PERCUTANEOUS CORONARY STENT INTERVENTION (PCI-S);  Surgeon: [name], MD;  Location: [facility] CATH LAB;  Service: Cardiovascular;;  . TEE WITHOUT CARDIOVERSION  07/26/2012   Procedure: TRANSESOPHAGEAL ECHOCARDIOGRAM (TEE);  Surgeon: [name], MD;  Location: [facility] ENDOSCOPY;  Service: Cardiovascular;  Laterality: N/A;  . TEE WITHOUT CARDIOVERSION N/A 08/25/2014   Procedure: TRANSESOPHAGEAL ECHOCARDIOGRAM (TEE);  Surgeon: Larey Dresser, MD;  Location: [place];  Service: Cardiovascular;  Laterality: N/A;  . TEE WITHOUT CARDIOVERSION  01/26/2015   Procedure: Transesophageal Echocardiogram (Tee);  Surgeon: [name], MD;  Location: [place] CV LAB;  Service:  Cardiovascular;;  . TEE WITHOUT CARDIOVERSION N/A 07/04/2019   Procedure: TRANSESOPHAGEAL ECHOCARDIOGRAM (TEE);  Surgeon: Dorothy Spark, MD;  Location: [facility] ENDOSCOPY;  Service: Cardiovascular;  Laterality: N/A;  . TONSILLECTOMY  age 37  . TOTAL HIP ARTHROPLASTY  07/25/2011   Procedure: TOTAL HIP ARTHROPLASTY ANTERIOR APPROACH;  Surgeon: Mcarthur Rossetti;  Location: [facility] ORS;  Service: Orthopedics;  Laterality: Right;  . TOTAL HIP ARTHROPLASTY Left 02-08-2010  . TRANSURETHRAL RESECTION OF BLADDER TUMOR WITH GYRUS (TURBT-GYRUS) N/A 04/12/2013   Procedure: TRANSURETHRAL RESECTION OF BLADDER TUMOR WITH GYRUS (TURBT-GYRUS);  Surgeon: [name], MD;  Location: [facility];  Service: Urology;  Laterality: N/A;    Current Outpatient Medications  Medication Sig Dispense Refill  . bisoprolol-hydrochlorothiazide (ZIAC) 10-6.25 MG tablet Take 1 tablet Daily for BP 90 tablet 3  . Cholecalciferol (VITAMIN D3) 125 MCG (5000 UT) CAPS Take 10,000 units Daily 30 capsule   . diltiazem (CARTIA XT) 180 MG 24 hr capsule Take 1 capsule by mouth once daily 90 capsule 2  . ezetimibe (ZETIA) 10 MG tablet Take 1 tablet Daily for Cholesterol 90 tablet 3  . metoprolol tartrate (LOPRESSOR) 25 MG tablet Take 0.5 tablets (12.5 mg total) by mouth 2 (two) times daily. 30 tablet 1  . Multiple Vitamin (MULTIVITAMIN WITH MINERALS) TABS tablet Take 1 tablet Daily    . nitroGLYCERIN (NITROSTAT) 0.4 MG SL tablet DISSOLVE  ONE TABLET UNDER THE TONGUE EVERY 5 MINUTES AS NEEDED FOR CHEST PAIN.  DO NOT EXCEED A TOTAL OF 3 DOSES IN 15 MINUTES 25 tablet 6  . Polyvinyl Alcohol-Povidone (REFRESH OP) Place 1 drop into both eyes daily as needed (dry eyes).    . rivaroxaban (XARELTO) 20 MG TABS tablet TAKE 1 TABLET BY MOUTH ONCE DAILY WITH SUPPER 90 tablet 2  . rosuvastatin (CRESTOR) 40 MG tablet Take 1 tablet Daily for Cholesterol 90 tablet 3  . zinc gluconate 50 MG tablet Take 50 mg by mouth daily.     No current  facility-administered medications for this encounter.    No Known Allergies  Social History   Socioeconomic History  . Marital status: Married    Spouse name: Not on file  . Number of children: Not on file  . Years of education: Not on file  . Highest education level: Not on file  Occupational History  . Not on file  Tobacco Use  . Smoking status: Former Smoker    Packs/day: 2.00    Years: 10.00    Pack years: 20.00    Types: Cigarettes    Quit date: 09/20/1981    Years since quitting: 38.0  . Smokeless tobacco: Never Used  Substance and Sexual Activity  . Alcohol use: No    Comment: pt states he has stopped drinking alcohol  . Drug use: No  . Sexual activity: Not on file  Other Topics Concern  . Not on file  Social History Narrative  . Not on file   Social Determinants of Health   Financial Resource Strain:   . Difficulty of Paying Living Expenses: Not on file  Food Insecurity:   . Worried About Charity fundraiser in the Last Year: Not on file  . Ran Out of Food in the Last Year: Not on file  Transportation Needs:   . Lack of Transportation (Medical): Not on file  . Lack of Transportation (Non-Medical): Not on file  Physical Activity:   . Days of Exercise per Week: Not on file  . Minutes of Exercise per Session: Not on file  Stress:   . Feeling of Stress : Not on file  Social Connections:   . Frequency of Communication with Friends and Family: Not on file  . Frequency of Social Gatherings with Friends and Family: Not on file  . Attends Religious Services: Not on file  . Active Member of Clubs or Organizations: Not on file  . Attends Archivist Meetings: Not on file  . Marital Status: Not on file  Intimate Partner Violence:   . Fear of Current or Ex-Partner: Not on file  . Emotionally Abused: Not on file  . Physically Abused: Not on file  . Sexually Abused: Not on file    Family History  Problem Relation Age of Onset  . Heart attack Mother     . Heart disease Mother   . Heart failure Father   . Heart disease Father     ROS- All systems are reviewed and negative except as per the HPI above  Physical Exam: There were no vitals filed for this visit. Wt Readings from Last 3 Encounters:  09/06/19 86.8 kg  08/04/19 85.2 kg  07/11/19 87.9 kg    Labs: Lab Results  Component Value Date   NA 144 08/04/2019   K 3.9 08/04/2019   CL 108 08/04/2019   CO2 28 08/04/2019   GLUCOSE 93 08/04/2019   BUN 11  08/04/2019   CREATININE 0.88 08/04/2019   CALCIUM 9.6 08/04/2019   MG 2.2 08/04/2019   Lab Results  Component Value Date   INR 1.1 (H) 11/16/2013   Lab Results  Component Value Date   CHOL 108 08/04/2019   HDL 48 08/04/2019   LDLCALC 45 08/04/2019   TRIG 70 08/04/2019     GEN- The patient is well appearing, alert and oriented x 3 today.   Head- normocephalic, atraumatic Eyes-  Sclera clear, conjunctiva pink Ears- hearing intact Oropharynx- clear Neck- supple, no JVP Lymph- no cervical lymphadenopathy Lungs- Clear to ausculation bilaterally, normal work of breathing Heart- irregular rate and rhythm, no murmurs, rubs or gallops, PMI not laterally displaced GI- soft, NT, ND, + BS Extremities- no clubbing, cyanosis, or edema MS- no significant deformity or atrophy Skin- no rash or lesion Psych- euthymic mood, full affect Neuro- strength and sensation are intact  EKG-  afib at 87 bmp, qrs int 88 ms, qtc 438 ms    Assessment and Plan: 1. Paroxysmal afib  Successful cardioversion in  December  Went into afib last week, low dose BB was added but continues in afib rate controlled  Will schedule for DCCV Stop BB am of cardioversion as in SR he usually will have low HR's in the 50's Trigger may have been covid shot If continues with increase in afib burden, then may need to discuss antiarrythmic's Cbc/bmet today/covid test   2. CHA2DS2VASc score of 3 Continue  xarelto 20 mg daily  Has not missed any doses of  xarelto for at least 3 weeks   F/u in afib clinic in one week  Damon [name], [facility] [address] [place], [place] [address] [phone]

## 2019-09-19 NOTE — Progress Notes (Signed)
Primary Care Physician: [name], MD Referring Physician: [name] is a 74 y.o. male with a h/o paroxysmal afib that is in the clinic for going back into afib sometime over the last couple of days.He had an colonoscopy last Friday which may have been  the trigger. He came off anticoagulation for 2 days prior. He is rate controled today.  F/u in afib clinic, 07/11/19, after successful  cardioversion and continues in SR. He c/o of some burning/nausea after the cardioversion but states it was actually going on before then as he had stopped Vit C thinking he was not tolerating. He has seen his PCP and was started on PPI and symptoms  are  improving.   F/u in afib clinic, 09/19/19, as pt has gone back into  afib and is requesting cardioversion.He had successful cardioversion in December 2020. He feels the trigger was the covid shot. The afib started the next day after the shot. He is rate controlled. He has had 3 ablations in the past. He was out of town when he went into afib. Low dose BB was added but he remains in afib   Today, he denies symptoms of palpitations, chest pain, shortness of breath, orthopnea, PND, lower extremity edema, dizziness, presyncope, syncope, or neurologic sequela. The patient is tolerating medications without difficulties and is otherwise without complaint today.   Past Medical History:  Diagnosis Date  . Bladder cancer ([place])   . Hearing loss of both ears   . History of colon polyps    BENIGN  . Hyperlipidemia   . Hypertension   . Mitral regurgitation   . OSA on CPAP    cpap settign of 13  . PAD (peripheral artery disease) (HCC)    ABI--  RIGHT SIDE NORMAL LEFT SIDE MODERATELY REDUCED W/ DISTAL LEFT SFA STENOSIS//  MILD CALDICATION  . PAF (paroxysmal atrial fibrillation) ([place])     a. s/p PVI 2014; b. s/p redo PVI and CTI 01/2015  . Pre-diabetes   . Vitamin D deficiency    Past Surgical History:  Procedure Laterality Date  . ABLATION  OF DYSRHYTHMIC FOCUS  01/26/2015  . ATRIAL FIBRILLATION ABLATION N/A 07/27/2012   Procedure: ATRIAL FIBRILLATION ABLATION;  Surgeon: [name], MD;  Location: [facility] CATH LAB;  Service: Cardiovascular;  Laterality: N/A;  . ATRIAL FIBRILLATION ABLATION N/A 08/25/2018   Procedure: ATRIAL FIBRILLATION ABLATION;  Surgeon: [name], MD;  Location: [place] CV LAB;  Service: Cardiovascular;  Laterality: N/A;  . CARDIAC ELECTROPHYSIOLOGY STUDY AND ABLATION  07-27-2012  DR ALLRED   SUCCESSFUL ABLATION OF A-FIB  . CARDIOVERSION  08/06/2012   Procedure: CARDIOVERSION;  Surgeon: [name], MD;  Location: [facility] ENDOSCOPY;  Service: Cardiovascular;  Laterality: N/A;  . CARDIOVERSION N/A 08/25/2014   Procedure: CARDIOVERSION;  Surgeon: Larey Dresser, MD;  Location: [place];  Service: Cardiovascular;  Laterality: N/A;  . CARDIOVERSION N/A 11/13/2014   Procedure: CARDIOVERSION;  Surgeon: Sueanne Margarita, MD;  Location: [facility] ENDOSCOPY;  Service: Cardiovascular;  Laterality: N/A;  . CARDIOVERSION N/A 12/28/2014   Procedure: CARDIOVERSION;  Surgeon: [name], MD;  Location: [facility] ENDOSCOPY;  Service: Cardiovascular;  Laterality: N/A;  . CARDIOVERSION N/A 01/30/2017   Procedure: CARDIOVERSION;  Surgeon: Josue Hector, MD;  Location: [facility] ENDOSCOPY;  Service: Cardiovascular;  Laterality: N/A;  . CARDIOVERSION N/A 04/06/2017   Procedure: CARDIOVERSION;  Surgeon: Dorothy Spark, MD;  Location: [place];  Service: Cardiovascular;  Laterality: N/A;  .  CARDIOVERSION N/A 05/25/2018   Procedure: CARDIOVERSION;  Surgeon: Skeet Latch, MD;  Location: [facility] ENDOSCOPY;  Service: Cardiovascular;  Laterality: N/A;  . CARDIOVERSION N/A 08/09/2018   Procedure: CARDIOVERSION;  Surgeon: Buford Dresser, MD;  Location: [facility] ENDOSCOPY;  Service: Cardiovascular;  Laterality: N/A;  . CARDIOVERSION N/A 07/04/2019   Procedure: CARDIOVERSION;  Surgeon: Dorothy Spark, MD;  Location: [place];  Service:  Cardiovascular;  Laterality: N/A;  . CATARACT EXTRACTION W/ INTRAOCULAR LENS  IMPLANT, BILATERAL    . COLONOSCOPY WITH PROPOFOL N/A 02/13/2015   Procedure: COLONOSCOPY WITH PROPOFOL;  Surgeon: Garlan Fair, MD;  Location: [facility] ENDOSCOPY;  Service: Endoscopy;  Laterality: N/A;  . CYSTOSCOPY W/ RETROGRADES Bilateral 04/12/2013   Procedure: CYSTOSCOPY WITH RETROGRADE PYELOGRAM;  Surgeon: [name], MD;  Location: [facility];  Service: Urology;  Laterality: Bilateral;  . ELECTROPHYSIOLOGIC STUDY N/A 01/26/2015   Procedure: Atrial Fibrillation Ablation;  Surgeon: [name], MD;  Location: [place] CV LAB;  Service: Cardiovascular;  Laterality: N/A;  . LEFT HEART CATHETERIZATION WITH CORONARY ANGIOGRAM N/A 11/24/2013   Procedure: LEFT HEART CATHETERIZATION WITH CORONARY ANGIOGRAM;  Surgeon: [name], MD;  Location: [facility] CATH LAB;  Service: Cardiovascular;  Laterality: N/A;  . PERCUTANEOUS CORONARY ROTOBLATOR INTERVENTION (PCI-R) N/A 11/28/2013   Procedure: PERCUTANEOUS CORONARY ROTOBLATOR INTERVENTION (PCI-R);  Surgeon: [name], MD;  Location: [facility] CATH LAB;  Service: Cardiovascular;  Laterality: N/A;  . PERCUTANEOUS CORONARY STENT INTERVENTION (PCI-S)  11/24/2013   Procedure: PERCUTANEOUS CORONARY STENT INTERVENTION (PCI-S);  Surgeon: [name], MD;  Location: [facility] CATH LAB;  Service: Cardiovascular;;  . TEE WITHOUT CARDIOVERSION  07/26/2012   Procedure: TRANSESOPHAGEAL ECHOCARDIOGRAM (TEE);  Surgeon: [name], MD;  Location: [facility] ENDOSCOPY;  Service: Cardiovascular;  Laterality: N/A;  . TEE WITHOUT CARDIOVERSION N/A 08/25/2014   Procedure: TRANSESOPHAGEAL ECHOCARDIOGRAM (TEE);  Surgeon: Larey Dresser, MD;  Location: [place];  Service: Cardiovascular;  Laterality: N/A;  . TEE WITHOUT CARDIOVERSION  01/26/2015   Procedure: Transesophageal Echocardiogram (Tee);  Surgeon: [name], MD;  Location: [place] CV LAB;  Service:  Cardiovascular;;  . TEE WITHOUT CARDIOVERSION N/A 07/04/2019   Procedure: TRANSESOPHAGEAL ECHOCARDIOGRAM (TEE);  Surgeon: Dorothy Spark, MD;  Location: [facility] ENDOSCOPY;  Service: Cardiovascular;  Laterality: N/A;  . TONSILLECTOMY  age 70  . TOTAL HIP ARTHROPLASTY  07/25/2011   Procedure: TOTAL HIP ARTHROPLASTY ANTERIOR APPROACH;  Surgeon: Mcarthur Rossetti;  Location: [facility] ORS;  Service: Orthopedics;  Laterality: Right;  . TOTAL HIP ARTHROPLASTY Left 02-08-2010  . TRANSURETHRAL RESECTION OF BLADDER TUMOR WITH GYRUS (TURBT-GYRUS) N/A 04/12/2013   Procedure: TRANSURETHRAL RESECTION OF BLADDER TUMOR WITH GYRUS (TURBT-GYRUS);  Surgeon: [name], MD;  Location: [facility];  Service: Urology;  Laterality: N/A;    Current Outpatient Medications  Medication Sig Dispense Refill  . bisoprolol-hydrochlorothiazide (ZIAC) 10-6.25 MG tablet Take 1 tablet Daily for BP 90 tablet 3  . Cholecalciferol (VITAMIN D3) 125 MCG (5000 UT) CAPS Take 10,000 units Daily 30 capsule   . diltiazem (CARTIA XT) 180 MG 24 hr capsule Take 1 capsule by mouth once daily 90 capsule 2  . ezetimibe (ZETIA) 10 MG tablet Take 1 tablet Daily for Cholesterol 90 tablet 3  . metoprolol tartrate (LOPRESSOR) 25 MG tablet Take 0.5 tablets (12.5 mg total) by mouth 2 (two) times daily. 30 tablet 1  . Multiple Vitamin (MULTIVITAMIN WITH MINERALS) TABS tablet Take 1 tablet Daily    . nitroGLYCERIN (NITROSTAT) 0.4 MG SL tablet DISSOLVE  ONE TABLET UNDER THE TONGUE EVERY 5 MINUTES AS NEEDED FOR CHEST PAIN.  DO NOT EXCEED A TOTAL OF 3 DOSES IN 15 MINUTES 25 tablet 6  . Polyvinyl Alcohol-Povidone (REFRESH OP) Place 1 drop into both eyes daily as needed (dry eyes).    . rivaroxaban (XARELTO) 20 MG TABS tablet TAKE 1 TABLET BY MOUTH ONCE DAILY WITH SUPPER 90 tablet 2  . rosuvastatin (CRESTOR) 40 MG tablet Take 1 tablet Daily for Cholesterol 90 tablet 3  . zinc gluconate 50 MG tablet Take 50 mg by mouth daily.     No current  facility-administered medications for this encounter.    No Known Allergies  Social History   Socioeconomic History  . Marital status: Married    Spouse name: Not on file  . Number of children: Not on file  . Years of education: Not on file  . Highest education level: Not on file  Occupational History  . Not on file  Tobacco Use  . Smoking status: Former Smoker    Packs/day: 2.00    Years: 10.00    Pack years: 20.00    Types: Cigarettes    Quit date: 09/20/1981    Years since quitting: 38.0  . Smokeless tobacco: Never Used  Substance and Sexual Activity  . Alcohol use: No    Comment: pt states he has stopped drinking alcohol  . Drug use: No  . Sexual activity: Not on file  Other Topics Concern  . Not on file  Social History Narrative  . Not on file   Social Determinants of Health   Financial Resource Strain:   . Difficulty of Paying Living Expenses: Not on file  Food Insecurity:   . Worried About Charity fundraiser in the Last Year: Not on file  . Ran Out of Food in the Last Year: Not on file  Transportation Needs:   . Lack of Transportation (Medical): Not on file  . Lack of Transportation (Non-Medical): Not on file  Physical Activity:   . Days of Exercise per Week: Not on file  . Minutes of Exercise per Session: Not on file  Stress:   . Feeling of Stress : Not on file  Social Connections:   . Frequency of Communication with Friends and Family: Not on file  . Frequency of Social Gatherings with Friends and Family: Not on file  . Attends Religious Services: Not on file  . Active Member of Clubs or Organizations: Not on file  . Attends Archivist Meetings: Not on file  . Marital Status: Not on file  Intimate Partner Violence:   . Fear of Current or Ex-Partner: Not on file  . Emotionally Abused: Not on file  . Physically Abused: Not on file  . Sexually Abused: Not on file    Family History  Problem Relation Age of Onset  . Heart attack Mother     . Heart disease Mother   . Heart failure Father   . Heart disease Father     ROS- All systems are reviewed and negative except as per the HPI above  Physical Exam: There were no vitals filed for this visit. Wt Readings from Last 3 Encounters:  09/06/19 86.8 kg  08/04/19 85.2 kg  07/11/19 87.9 kg    Labs: Lab Results  Component Value Date   NA 144 08/04/2019   K 3.9 08/04/2019   CL 108 08/04/2019   CO2 28 08/04/2019   GLUCOSE 93 08/04/2019   BUN 11  08/04/2019   CREATININE 0.88 08/04/2019   CALCIUM 9.6 08/04/2019   MG 2.2 08/04/2019   Lab Results  Component Value Date   INR 1.1 (H) 11/16/2013   Lab Results  Component Value Date   CHOL 108 08/04/2019   HDL 48 08/04/2019   LDLCALC 45 08/04/2019   TRIG 70 08/04/2019     GEN- The patient is well appearing, alert and oriented x 3 today.   Head- normocephalic, atraumatic Eyes-  Sclera clear, conjunctiva pink Ears- hearing intact Oropharynx- clear Neck- supple, no JVP Lymph- no cervical lymphadenopathy Lungs- Clear to ausculation bilaterally, normal work of breathing Heart- irregular rate and rhythm, no murmurs, rubs or gallops, PMI not laterally displaced GI- soft, NT, ND, + BS Extremities- no clubbing, cyanosis, or edema MS- no significant deformity or atrophy Skin- no rash or lesion Psych- euthymic mood, full affect Neuro- strength and sensation are intact  EKG-  afib at 87 bmp, qrs int 88 ms, qtc 438 ms    Assessment and Plan: 1. Paroxysmal afib  Successful cardioversion in  December  Went into afib last week, low dose BB was added but continues in afib rate controlled  Will schedule for DCCV Stop BB am of cardioversion as in SR he usually will have low HR's in the 50's Trigger may have been covid shot If continues with increase in afib burden, then may need to discuss antiarrythmic's Cbc/bmet today/covid test   2. CHA2DS2VASc score of 3 Continue  xarelto 20 mg daily  Has not missed any doses of  xarelto for at least 3 weeks   F/u in afib clinic in one week  Butch Penny C. Shaquetta Arcos, [facility] [address] [place], [place] [address] [phone]

## 2019-09-19 NOTE — Patient Instructions (Addendum)
Cardioversion scheduled for Friday, March 5th  - Arrive at the [facility] and go to admitting at [facility] not eat or drink anything after midnight the night prior to your procedure.  - Take all your medication with a sip of water prior to arrival.  - You will not be able to drive home after your procedure.  Stop metoprolol day of cardioversion

## 2019-09-20 ENCOUNTER — Other Ambulatory Visit (HOSPITAL_COMMUNITY)
Admission: RE | Admit: 2019-09-20 | Discharge: 2019-09-20 | Disposition: A | Discharge status: Home / Self Care | Payer: Medicare Other | Source: Ambulatory Visit | Attending: Cardiology | Admitting: Cardiology

## 2019-09-20 DIAGNOSIS — Z20822 Contact with and (suspected) exposure to covid-19: Secondary | ICD-10-CM | POA: Insufficient documentation

## 2019-09-20 DIAGNOSIS — Z01812 Encounter for preprocedural laboratory examination: Secondary | ICD-10-CM | POA: Insufficient documentation

## 2019-09-20 LAB — SARS CORONAVIRUS 2 (TAT 6-24 HRS): SARS Coronavirus 2: NEGATIVE

## 2019-09-22 NOTE — Anesthesia Preprocedure Evaluation (Addendum)
Anesthesia Evaluation  Patient identified by MRN, date of birth, ID band Patient awake    Reviewed: Allergy & Precautions, NPO status , Patient's Chart, lab work & pertinent test results  Airway Mallampati: I  TM Distance: >3 FB Neck ROM: Full    Dental no notable dental hx. (+) Teeth Intact   Pulmonary sleep apnea and Continuous Positive Airway Pressure Ventilation , former smoker,    Pulmonary exam normal breath sounds clear to auscultation       Cardiovascular hypertension, Pt. on medications + CAD  Normal cardiovascular exam+ dysrhythmias Atrial Fibrillation  Rhythm:Irregular Rate:Abnormal     Neuro/Psych negative neurological ROS  negative psych ROS   GI/Hepatic Neg liver ROS, GERD  ,  Endo/Other  negative endocrine ROS  Renal/GU      Musculoskeletal   Abdominal   Peds  Hematology   Anesthesia Other Findings Last cv 12/20   Reproductive/Obstetrics                            Anesthesia Physical Anesthesia Plan  ASA: II  Anesthesia Plan: General   Post-op Pain Management:    Induction:   PONV Risk Score and Plan: Treatment may vary due to age or medical condition  Airway Management Planned: Nasal Cannula and Natural Airway  Additional Equipment: None  Intra-op Plan:   Post-operative Plan:   Informed Consent: I have reviewed the patients History and Physical, chart, labs and discussed the procedure including the risks, benefits and alternatives for the proposed anesthesia with the patient or authorized representative who has indicated his/her understanding and acceptance.     Dental advisory given  Plan Discussed with:   Anesthesia Plan Comments:        Anesthesia Quick Evaluation

## 2019-09-23 ENCOUNTER — Ambulatory Visit (HOSPITAL_COMMUNITY): Payer: Medicare Other | Admitting: Anesthesiology

## 2019-09-23 ENCOUNTER — Encounter (HOSPITAL_COMMUNITY)
Admission: RE | Disposition: A | Discharge status: Home / Self Care | Payer: Self-pay | Source: Home / Self Care | Attending: Cardiology

## 2019-09-23 ENCOUNTER — Encounter (HOSPITAL_COMMUNITY): Payer: Self-pay | Admitting: Cardiology

## 2019-09-23 ENCOUNTER — Ambulatory Visit (HOSPITAL_COMMUNITY)
Admission: RE | Admit: 2019-09-23 | Discharge: 2019-09-23 | Disposition: A | Discharge status: Home / Self Care | Payer: Medicare Other | Attending: Cardiology | Admitting: Cardiology

## 2019-09-23 DIAGNOSIS — G4733 Obstructive sleep apnea (adult) (pediatric): Secondary | ICD-10-CM | POA: Insufficient documentation

## 2019-09-23 DIAGNOSIS — I340 Nonrheumatic mitral (valve) insufficiency: Secondary | ICD-10-CM | POA: Insufficient documentation

## 2019-09-23 DIAGNOSIS — Z8551 Personal history of malignant neoplasm of bladder: Secondary | ICD-10-CM | POA: Diagnosis not present

## 2019-09-23 DIAGNOSIS — I10 Essential (primary) hypertension: Secondary | ICD-10-CM | POA: Insufficient documentation

## 2019-09-23 DIAGNOSIS — Z7901 Long term (current) use of anticoagulants: Secondary | ICD-10-CM | POA: Diagnosis not present

## 2019-09-23 DIAGNOSIS — Z87891 Personal history of nicotine dependence: Secondary | ICD-10-CM | POA: Insufficient documentation

## 2019-09-23 DIAGNOSIS — I480 Paroxysmal atrial fibrillation: Secondary | ICD-10-CM | POA: Diagnosis present

## 2019-09-23 DIAGNOSIS — E785 Hyperlipidemia, unspecified: Secondary | ICD-10-CM | POA: Insufficient documentation

## 2019-09-23 DIAGNOSIS — R7303 Prediabetes: Secondary | ICD-10-CM | POA: Insufficient documentation

## 2019-09-23 DIAGNOSIS — I739 Peripheral vascular disease, unspecified: Secondary | ICD-10-CM | POA: Insufficient documentation

## 2019-09-23 DIAGNOSIS — I4819 Other persistent atrial fibrillation: Secondary | ICD-10-CM

## 2019-09-23 DIAGNOSIS — E559 Vitamin D deficiency, unspecified: Secondary | ICD-10-CM | POA: Insufficient documentation

## 2019-09-23 DIAGNOSIS — Z79899 Other long term (current) drug therapy: Secondary | ICD-10-CM | POA: Insufficient documentation

## 2019-09-23 HISTORY — PX: CARDIOVERSION: SHX1299

## 2019-09-23 SURGERY — CARDIOVERSION
Anesthesia: General

## 2019-09-23 MED ORDER — PROPOFOL 10 MG/ML IV BOLUS
INTRAVENOUS | Status: DC | PRN
  Administered 2019-09-23: 60 mg via INTRAVENOUS
  Administered 2019-09-23: 10 mg via INTRAVENOUS

## 2019-09-23 MED ORDER — LIDOCAINE 2% (20 MG/ML) 5 ML SYRINGE
INTRAMUSCULAR | Status: DC | PRN
  Administered 2019-09-23: 60 mg via INTRAVENOUS

## 2019-09-23 MED ORDER — SODIUM CHLORIDE 0.9 % IV SOLN: INTRAVENOUS | Status: DC | PRN

## 2019-09-23 NOTE — Anesthesia Postprocedure Evaluation (Signed)
Anesthesia Post Note  Patient: Damon [name]  Procedure(s) Performed: CARDIOVERSION (N/A )     Patient location during evaluation: Endoscopy Anesthesia Type: General Level of consciousness: awake and alert Pain management: pain level controlled Vital Signs Assessment: post-procedure vital signs reviewed and stable Respiratory status: spontaneous breathing, nonlabored ventilation, respiratory function stable and patient connected to nasal cannula oxygen Cardiovascular status: blood pressure returned to baseline and stable Postop Assessment: no apparent nausea or vomiting Anesthetic complications: no    Last Vitals:  Vitals:   09/23/19 1128 09/23/19 1138  BP: (!) 91/52 (!) 87/55  Pulse: (!) 56 (!) 55  Resp: 16 10  Temp:    SpO2: 97% 94%    Last Pain:  Vitals:   09/23/19 1138  TempSrc:   PainSc: 0-No pain                 Barnet Glasgow

## 2019-09-23 NOTE — Discharge Instructions (Signed)
Electrical Cardioversion Electrical cardioversion is the delivery of a jolt of electricity to restore a normal rhythm to the heart. A rhythm that is too fast or is not regular keeps the heart from pumping well. In this procedure, sticky patches or metal paddles are placed on the chest to deliver electricity to the heart from a device.  What can I expect after the procedure?  Your blood pressure, heart rate, breathing rate, and blood oxygen level will be monitored until you leave the hospital or clinic.  Your heart rhythm will be watched to make sure it does not change.  You may have some redness on the skin where the shocks were given.  Follow these instructions at home:  Do not drive for 24 hours if you were given a sedative during your procedure.  Take over-the-counter and prescription medicines only as told by your health care provider.  Ask your health care provider how to check your pulse. Check it often.  Rest for 48 hours after the procedure or as told by your health care provider.  Avoid or limit your caffeine use as told by your health care provider.  Keep all follow-up visits as told by your health care provider. This is important.  Contact a health care provider if:  You feel like your heart is beating too quickly or your pulse is not regular.  You have a serious muscle cramp that does not go away.  Get help right away if:  You have discomfort in your chest.  You are dizzy or you feel faint.  You have trouble breathing or you are short of breath.  Your speech is slurred.  You have trouble moving an arm or leg on one side of your body.  Your fingers or toes turn cold or blue.  Summary  Electrical cardioversion is the delivery of a jolt of electricity to restore a normal rhythm to the heart.  This procedure may be done right away in an emergency or may be a scheduled procedure if the condition is not an emergency.  Generally, this is a safe  procedure.  After the procedure, check your pulse often as told by your health care provider.  This information is not intended to replace advice given to you by your health care provider. Make sure you discuss any questions you have with your health care provider. Document Revised: 02/07/2019 Document Reviewed: 02/07/2019 Elsevier Patient Education  2020 Elsevier Inc.  

## 2019-09-23 NOTE — Interval H&P Note (Signed)
History and Physical Interval Note:  09/23/2019 10:23 AM  Damon [name]  has presented today for surgery, with the diagnosis of AFIB.  The various methods of treatment have been discussed with the patient and family. After consideration of risks, benefits and other options for treatment, the patient has consented to  Procedure(s): CARDIOVERSION (N/A) as a surgical intervention.  The patient's history has been reviewed, patient examined, no change in status, stable for surgery.  I have reviewed the patient's chart and labs.  Questions were answered to the patient's satisfaction.     [name]

## 2019-09-23 NOTE — CV Procedure (Signed)
   Cardioversion Note  Damon [name] [id number] 1945-10-21  Procedure: DC Cardioversion Indications: atrial fibrillation  Procedure Details Consent: Obtained Time Out: Verified patient identification, verified procedure, site/side was marked, verified correct patient position, special equipment/implants available, Radiology Safety Procedures followed,  medications/allergies/relevent history reviewed, required imaging and test results available.  Performed  The patient has been on adequate anticoagulation.  The patient received 70 mg of IV propofol administered by anesthesia staff for sedation.  Synchronous cardioversion was performed at 200 joules.  The cardioversion was successful.  Complications: No apparent complications Patient did tolerate procedure well.   Damon Dawley, MD, [facility] 09/23/2019, 11:14 AM

## 2019-09-23 NOTE — Transfer of Care (Signed)
Immediate Anesthesia Transfer of Care Note  Patient: Damon [name]  Procedure(s) Performed: CARDIOVERSION (N/A )  Patient Location: Endoscopy Unit  Anesthesia Type:General  Level of Consciousness: drowsy and patient cooperative  Airway & Oxygen Therapy: Patient Spontanous Breathing  Post-op Assessment: Report given to RN, Post -op Vital signs reviewed and stable and Patient moving all extremities X 4  Post vital signs: Reviewed and stable  Last Vitals:  Vitals Value Taken Time  BP    Temp    Pulse    Resp    SpO2      Last Pain:  Vitals:   09/23/19 1034  PainSc: 0-No pain         Complications: No apparent anesthesia complications

## 2019-09-30 ENCOUNTER — Encounter (HOSPITAL_COMMUNITY): Payer: Self-pay

## 2019-09-30 ENCOUNTER — Ambulatory Visit (HOSPITAL_COMMUNITY): Payer: Medicare Other | Admitting: Nurse Practitioner

## 2019-10-03 ENCOUNTER — Encounter (HOSPITAL_COMMUNITY): Payer: Self-pay | Admitting: Nurse Practitioner

## 2019-10-03 ENCOUNTER — Ambulatory Visit (HOSPITAL_COMMUNITY)
Admission: RE | Admit: 2019-10-03 | Discharge: 2019-10-03 | Disposition: A | Discharge status: Home / Self Care | Payer: Medicare Other | Source: Ambulatory Visit | Attending: Nurse Practitioner | Admitting: Nurse Practitioner

## 2019-10-03 ENCOUNTER — Other Ambulatory Visit: Payer: Self-pay

## 2019-10-03 VITALS — BP 120/62 | HR 57 | Ht 69.0 in | Wt 190.4 lb

## 2019-10-03 DIAGNOSIS — E785 Hyperlipidemia, unspecified: Secondary | ICD-10-CM | POA: Diagnosis not present

## 2019-10-03 DIAGNOSIS — G4733 Obstructive sleep apnea (adult) (pediatric): Secondary | ICD-10-CM | POA: Insufficient documentation

## 2019-10-03 DIAGNOSIS — I10 Essential (primary) hypertension: Secondary | ICD-10-CM | POA: Diagnosis not present

## 2019-10-03 DIAGNOSIS — Z87891 Personal history of nicotine dependence: Secondary | ICD-10-CM | POA: Diagnosis not present

## 2019-10-03 DIAGNOSIS — Z9841 Cataract extraction status, right eye: Secondary | ICD-10-CM | POA: Insufficient documentation

## 2019-10-03 DIAGNOSIS — E559 Vitamin D deficiency, unspecified: Secondary | ICD-10-CM | POA: Insufficient documentation

## 2019-10-03 DIAGNOSIS — D6869 Other thrombophilia: Secondary | ICD-10-CM | POA: Diagnosis not present

## 2019-10-03 DIAGNOSIS — Z8249 Family history of ischemic heart disease and other diseases of the circulatory system: Secondary | ICD-10-CM | POA: Insufficient documentation

## 2019-10-03 DIAGNOSIS — I739 Peripheral vascular disease, unspecified: Secondary | ICD-10-CM | POA: Diagnosis not present

## 2019-10-03 DIAGNOSIS — Z79899 Other long term (current) drug therapy: Secondary | ICD-10-CM | POA: Diagnosis not present

## 2019-10-03 DIAGNOSIS — Z7901 Long term (current) use of anticoagulants: Secondary | ICD-10-CM | POA: Insufficient documentation

## 2019-10-03 DIAGNOSIS — Z961 Presence of intraocular lens: Secondary | ICD-10-CM | POA: Insufficient documentation

## 2019-10-03 DIAGNOSIS — Z9842 Cataract extraction status, left eye: Secondary | ICD-10-CM | POA: Insufficient documentation

## 2019-10-03 DIAGNOSIS — Z96643 Presence of artificial hip joint, bilateral: Secondary | ICD-10-CM | POA: Diagnosis not present

## 2019-10-03 DIAGNOSIS — Z8551 Personal history of malignant neoplasm of bladder: Secondary | ICD-10-CM | POA: Diagnosis not present

## 2019-10-03 DIAGNOSIS — I480 Paroxysmal atrial fibrillation: Secondary | ICD-10-CM | POA: Diagnosis not present

## 2019-10-03 DIAGNOSIS — H9193 Unspecified hearing loss, bilateral: Secondary | ICD-10-CM | POA: Diagnosis not present

## 2019-10-03 DIAGNOSIS — I4891 Unspecified atrial fibrillation: Secondary | ICD-10-CM | POA: Diagnosis present

## 2019-10-03 NOTE — Progress Notes (Signed)
Primary Care Physician: [name], MD Referring Physician: [name] is a 74 y.o. male with a h/o paroxysmal afib that is in the clinic for going back into afib sometime over the last couple of days.He had an colonoscopy last Friday which may have been  the trigger. He came off anticoagulation for 2 days prior. He is rate controled today.  F/u in afib clinic, 07/11/19, after successful  cardioversion and continues in SR. He c/o of some burning/nausea after the cardioversion but states it was actually going on before then as he had stopped Vit C thinking he was not tolerating. He has seen his PCP and was started on PPI and symptoms  are  improving.   F/u in afib clinic, 09/19/19, as pt has gone back into  afib and is requesting cardioversion.He had successful cardioversion in December 2020. He feels the trigger was the covid shot. The afib started the next day after the shot. He is rate controlled. He has had 3 ablations in the past. He was out of town when he went into afib. Low dose BB was added but he remains in afib   F/u in afib clinic, 10/03/19. He had successful cardioversion and remains in SR. No further episodes of afib. EKG shows SR at 57 bpm. He still feels second covid shot may have done triggered afib.   Today, he denies symptoms of palpitations, chest pain, shortness of breath, orthopnea, PND, lower extremity edema, dizziness, presyncope, syncope, or neurologic sequela. The patient is tolerating medications without difficulties and is otherwise without complaint today.   Past Medical History:  Diagnosis Date  . Bladder cancer ([place])   . Hearing loss of both ears   . History of colon polyps    BENIGN  . Hyperlipidemia   . Hypertension   . Mitral regurgitation   . OSA on CPAP    cpap settign of 13  . PAD (peripheral artery disease) ([facility])    ABI--  RIGHT SIDE NORMAL LEFT SIDE MODERATELY REDUCED W/ DISTAL LEFT SFA STENOSIS//  MILD CALDICATION  . PAF  (paroxysmal atrial fibrillation) ([place])     a. s/p PVI 2014; b. s/p redo PVI and CTI 01/2015  . Pre-diabetes   . Vitamin D deficiency    Past Surgical History:  Procedure Laterality Date  . ABLATION OF DYSRHYTHMIC FOCUS  01/26/2015  . ATRIAL FIBRILLATION ABLATION N/A 07/27/2012   Procedure: ATRIAL FIBRILLATION ABLATION;  Surgeon: [name], MD;  Location: [facility] CATH LAB;  Service: Cardiovascular;  Laterality: N/A;  . ATRIAL FIBRILLATION ABLATION N/A 08/25/2018   Procedure: ATRIAL FIBRILLATION ABLATION;  Surgeon: [name], MD;  Location: [place] CV LAB;  Service: Cardiovascular;  Laterality: N/A;  . CARDIAC ELECTROPHYSIOLOGY STUDY AND ABLATION  07-27-2012  DR ALLRED   SUCCESSFUL ABLATION OF A-FIB  . CARDIOVERSION  08/06/2012   Procedure: CARDIOVERSION;  Surgeon: [name], MD;  Location: [facility] ENDOSCOPY;  Service: Cardiovascular;  Laterality: N/A;  . CARDIOVERSION N/A 08/25/2014   Procedure: CARDIOVERSION;  Surgeon: Larey Dresser, MD;  Location: [place];  Service: Cardiovascular;  Laterality: N/A;  . CARDIOVERSION N/A 11/13/2014   Procedure: CARDIOVERSION;  Surgeon: Sueanne Margarita, MD;  Location: [facility] ENDOSCOPY;  Service: Cardiovascular;  Laterality: N/A;  . CARDIOVERSION N/A 12/28/2014   Procedure: CARDIOVERSION;  Surgeon: [name], MD;  Location: [facility] ENDOSCOPY;  Service: Cardiovascular;  Laterality: N/A;  . CARDIOVERSION N/A 01/30/2017   Procedure: CARDIOVERSION;  Surgeon: Josue Hector,  MD;  Location: [place];  Service: Cardiovascular;  Laterality: N/A;  . CARDIOVERSION N/A 04/06/2017   Procedure: CARDIOVERSION;  Surgeon: Dorothy Spark, MD;  Location: [facility] ENDOSCOPY;  Service: Cardiovascular;  Laterality: N/A;  . CARDIOVERSION N/A 05/25/2018   Procedure: CARDIOVERSION;  Surgeon: Skeet Latch, MD;  Location: [facility] ENDOSCOPY;  Service: Cardiovascular;  Laterality: N/A;  . CARDIOVERSION N/A 08/09/2018   Procedure: CARDIOVERSION;  Surgeon: Buford Dresser, MD;   Location: [facility] ENDOSCOPY;  Service: Cardiovascular;  Laterality: N/A;  . CARDIOVERSION N/A 07/04/2019   Procedure: CARDIOVERSION;  Surgeon: Dorothy Spark, MD;  Location: [facility] ENDOSCOPY;  Service: Cardiovascular;  Laterality: N/A;  . CARDIOVERSION N/A 09/23/2019   Procedure: CARDIOVERSION;  Surgeon: Dorothy Spark, MD;  Location: [place];  Service: Cardiovascular;  Laterality: N/A;  . CATARACT EXTRACTION W/ INTRAOCULAR LENS  IMPLANT, BILATERAL    . COLONOSCOPY WITH PROPOFOL N/A 02/13/2015   Procedure: COLONOSCOPY WITH PROPOFOL;  Surgeon: Garlan Fair, MD;  Location: [facility] ENDOSCOPY;  Service: Endoscopy;  Laterality: N/A;  . CYSTOSCOPY W/ RETROGRADES Bilateral 04/12/2013   Procedure: CYSTOSCOPY WITH RETROGRADE PYELOGRAM;  Surgeon: [name], MD;  Location: [facility];  Service: Urology;  Laterality: Bilateral;  . ELECTROPHYSIOLOGIC STUDY N/A 01/26/2015   Procedure: Atrial Fibrillation Ablation;  Surgeon: [name], MD;  Location: [place] CV LAB;  Service: Cardiovascular;  Laterality: N/A;  . LEFT HEART CATHETERIZATION WITH CORONARY ANGIOGRAM N/A 11/24/2013   Procedure: LEFT HEART CATHETERIZATION WITH CORONARY ANGIOGRAM;  Surgeon: [name], MD;  Location: [facility] CATH LAB;  Service: Cardiovascular;  Laterality: N/A;  . PERCUTANEOUS CORONARY ROTOBLATOR INTERVENTION (PCI-R) N/A 11/28/2013   Procedure: PERCUTANEOUS CORONARY ROTOBLATOR INTERVENTION (PCI-R);  Surgeon: [name], MD;  Location: [facility] CATH LAB;  Service: Cardiovascular;  Laterality: N/A;  . PERCUTANEOUS CORONARY STENT INTERVENTION (PCI-S)  11/24/2013   Procedure: PERCUTANEOUS CORONARY STENT INTERVENTION (PCI-S);  Surgeon: [name], MD;  Location: [facility] CATH LAB;  Service: Cardiovascular;;  . TEE WITHOUT CARDIOVERSION  07/26/2012   Procedure: TRANSESOPHAGEAL ECHOCARDIOGRAM (TEE);  Surgeon: [name], MD;  Location: [facility] ENDOSCOPY;  Service: Cardiovascular;  Laterality: N/A;  . TEE  WITHOUT CARDIOVERSION N/A 08/25/2014   Procedure: TRANSESOPHAGEAL ECHOCARDIOGRAM (TEE);  Surgeon: Larey Dresser, MD;  Location: [place];  Service: Cardiovascular;  Laterality: N/A;  . TEE WITHOUT CARDIOVERSION  01/26/2015   Procedure: Transesophageal Echocardiogram (Tee);  Surgeon: [name], MD;  Location: [place] CV LAB;  Service: Cardiovascular;;  . TEE WITHOUT CARDIOVERSION N/A 07/04/2019   Procedure: TRANSESOPHAGEAL ECHOCARDIOGRAM (TEE);  Surgeon: Dorothy Spark, MD;  Location: [facility] ENDOSCOPY;  Service: Cardiovascular;  Laterality: N/A;  . TONSILLECTOMY  age 67  . TOTAL HIP ARTHROPLASTY  07/25/2011   Procedure: TOTAL HIP ARTHROPLASTY ANTERIOR APPROACH;  Surgeon: Mcarthur Rossetti;  Location: [facility] ORS;  Service: Orthopedics;  Laterality: Right;  . TOTAL HIP ARTHROPLASTY Left 02-08-2010  . TRANSURETHRAL RESECTION OF BLADDER TUMOR WITH GYRUS (TURBT-GYRUS) N/A 04/12/2013   Procedure: TRANSURETHRAL RESECTION OF BLADDER TUMOR WITH GYRUS (TURBT-GYRUS);  Surgeon: [name], MD;  Location: [facility];  Service: Urology;  Laterality: N/A;    Current Outpatient Medications  Medication Sig Dispense Refill  . bisoprolol-hydrochlorothiazide (ZIAC) 10-6.25 MG tablet Take 1 tablet Daily for BP (Patient taking differently: Take 1 tablet by mouth daily. ) 90 tablet 3  . Cholecalciferol (VITAMIN D3) 125 MCG (5000 UT) CAPS Take 10,000 units Daily (Patient taking differently: Take 5,000-10,000 Units by mouth daily. ) 30 capsule   .  diltiazem (CARTIA XT) 180 MG 24 hr capsule Take 1 capsule by mouth once daily (Patient taking differently: Take 180 mg by mouth daily. ) 90 capsule 2  . ezetimibe (ZETIA) 10 MG tablet Take 1 tablet Daily for Cholesterol (Patient taking differently: Take 10 mg by mouth daily. ) 90 tablet 3  . metoprolol tartrate (LOPRESSOR) 25 MG tablet Take 0.5 tablets (12.5 mg total) by mouth 2 (two) times daily. 30 tablet 1  . Multiple Vitamin (MULTIVITAMIN WITH  MINERALS) TABS tablet Take 1 tablet Daily    . nitroGLYCERIN (NITROSTAT) 0.4 MG SL tablet DISSOLVE ONE TABLET UNDER THE TONGUE EVERY 5 MINUTES AS NEEDED FOR CHEST PAIN.  DO NOT EXCEED A TOTAL OF 3 DOSES IN 15 MINUTES (Patient taking differently: Place 0.4 mg under the tongue every 5 (five) minutes as needed for chest pain. DISSOLVE ONE TABLET UNDER THE TONGUE EVERY 5 MINUTES AS NEEDED FOR CHEST PAIN.  DO NOT EXCEED A TOTAL OF 3 DOSES IN 15 MINUTES) 25 tablet 6  . Polyvinyl Alcohol-Povidone (REFRESH OP) Place 1 drop into both eyes daily as needed (dry eyes).    . rivaroxaban (XARELTO) 20 MG TABS tablet TAKE 1 TABLET BY MOUTH ONCE DAILY WITH SUPPER (Patient taking differently: Take 20 mg by mouth daily with supper. ) 90 tablet 2  . rosuvastatin (CRESTOR) 40 MG tablet Take 1 tablet Daily for Cholesterol 90 tablet 3  . zinc gluconate 50 MG tablet Take 50 mg by mouth daily.     No current facility-administered medications for this encounter.    No Known Allergies  Social History   Socioeconomic History  . Marital status: Married    Spouse name: Not on file  . Number of children: Not on file  . Years of education: Not on file  . Highest education level: Not on file  Occupational History  . Not on file  Tobacco Use  . Smoking status: Former Smoker    Packs/day: 2.00    Years: 10.00    Pack years: 20.00    Types: Cigarettes    Quit date: 09/20/1981    Years since quitting: 38.0  . Smokeless tobacco: Never Used  Substance and Sexual Activity  . Alcohol use: No    Comment: pt states he has stopped drinking alcohol  . Drug use: No  . Sexual activity: Not on file  Other Topics Concern  . Not on file  Social History Narrative  . Not on file   Social Determinants of Health   Financial Resource Strain:   . Difficulty of Paying Living Expenses:   Food Insecurity:   . Worried About Charity fundraiser in the Last Year:   . Arboriculturist in the Last Year:   Transportation Needs:   .  Film/video editor (Medical):   Marland Kitchen Lack of Transportation (Non-Medical):   Physical Activity:   . Days of Exercise per Week:   . Minutes of Exercise per Session:   Stress:   . Feeling of Stress :   Social Connections:   . Frequency of Communication with Friends and Family:   . Frequency of Social Gatherings with Friends and Family:   . Attends Religious Services:   . Active Member of Clubs or Organizations:   . Attends Archivist Meetings:   Marland Kitchen Marital Status:   Intimate Partner Violence:   . Fear of Current or Ex-Partner:   . Emotionally Abused:   Marland Kitchen Physically Abused:   . Sexually Abused:  Family History  Problem Relation Age of Onset  . Heart attack Mother   . Heart disease Mother   . Heart failure Father   . Heart disease Father     ROS- All systems are reviewed and negative except as per the HPI above  Physical Exam: Vitals:   10/03/19 0949  BP: 120/62  Pulse: (!) 57  Weight: 86.4 kg  Height: 5\' 9"  (1.753 m)   Wt Readings from Last 3 Encounters:  10/03/19 86.4 kg  09/23/19 86.2 kg  09/19/19 85.6 kg    Labs: Lab Results  Component Value Date   NA 141 09/19/2019   K 4.1 09/19/2019   CL 108 09/19/2019   CO2 22 09/19/2019   GLUCOSE 103 (H) 09/19/2019   BUN 9 09/19/2019   CREATININE 0.73 09/19/2019   CALCIUM 9.1 09/19/2019   MG 2.2 08/04/2019   Lab Results  Component Value Date   INR 1.1 (H) 11/16/2013   Lab Results  Component Value Date   CHOL 108 08/04/2019   HDL 48 08/04/2019   LDLCALC 45 08/04/2019   TRIG 70 08/04/2019     GEN- The patient is well appearing, alert and oriented x 3 today.   Head- normocephalic, atraumatic Eyes-  Sclera clear, conjunctiva pink Ears- hearing intact Oropharynx- clear Neck- supple, no JVP Lymph- no cervical lymphadenopathy Lungs- Clear to ausculation bilaterally, normal work of breathing Heart- regular rate and rhythm, no murmurs, rubs or gallops, PMI not laterally displaced GI- soft, NT,  ND, + BS Extremities- no clubbing, cyanosis, or edema MS- no significant deformity or atrophy Skin- no rash or lesion Psych- euthymic mood, full affect Neuro- strength and sensation are intact  EKG- Sinus brady at 57 bpm, pr int 152 ms, qtr int 92 ms, qtc 434 ms    Assessment and Plan: 1. Paroxysmal afib  Successful cardioversion in  December  Went into afib last week, low dose BB was added but continues in afib rate controlled  Scheduled for DCCV Stopped BB am of cardioversion as in SR he usually will have low HR's in the 50's Trigger may have been covid shot If continues with increase in afib burden, then may need to discuss antiarrythmic's  2. CHA2DS2VASc score of 3 Continue  xarelto 20 mg daily    F/u with Dr. [name] in 6 months   Geroge Baseman. Girolamo Lortie, [facility] [address] [place], [place] [address] [phone]

## 2019-11-07 NOTE — Progress Notes (Signed)
FOLLOW UP  Assessment and Plan:   Paroxysmal atrial fibrillation (HCC) Recently s/p repeat CV and reports doing well  Continue follow up cardio Continue xarelto, diltiazem, lopressor  CAD Control blood pressure, cholesterol, glucose, increase exercise.  Followed by cardiology No recent chest pain/nitroglycerine use  PAD Control blood pressure, cholesterol, glucose, increase exercise.   Hypertension Well controlled with current medications  Monitor blood pressure at home; patient to call if consistently greater than 130/80 Continue DASH diet.   Reminder to go to the ER if any CP, SOB, nausea, dizziness, severe HA, changes vision/speech, left arm numbness and tingling and jaw pain.  Cholesterol Currently at goal of LDL <70; currently on crestor 20 mg daily, working on lifestyle Continue low cholesterol diet and exercise.  Check lipid panel.   Other abnormal glucose Recent A1Cs well controlled Continue diet and exercise.  Perform daily foot/skin check, notify office of any concerning changes.  Will check A1C per strong patient preference due to recent diet adjustment   Overweight - BMI 27 Long discussion about weight loss, diet, and exercise Recommended diet heavy in fruits and veggies and low in animal meats, cheeses, and dairy products, appropriate calorie intake Discussed ideal weight for height and weight goal (180 lb) Patient will work on reducing portion sizes Will follow up in 3 months  Vitamin D Def Above goal at last visit; continue supplementation to maintain goal of 60-100 Check Vit D level   Continue diet and meds as discussed. Further disposition pending results of labs. Discussed med's effects and SE's.   Over 30 minutes of exam, counseling, chart review, and critical decision making was performed.   Future Appointments  Date Time Provider [place]  02/14/2020  9:30 AM [name], MD [facility] None  04/18/2020  9:00 AM Liane Comber, NP  [facility] None  08/23/2020  3:00 PM [name], MD [facility] None    ----------------------------------------------------------------------------------------------------------------------  HPI 74 y.o. male  presents for 3 month follow up on hypertension, cholesterol, glucose management, obesity and vitamin D deficiency.   Patient has ASCAD  S/p PCA/stenting followed by [facility]bl[facility] in 2015 (Dr Aundra Dubin).   Patient also has hx/o pAfib (on Xarelto) with RFA  x 2 in 2014/2016 with multiple ablations and CVs, most recently 09/23/2019 by Dr. Meda Coffee. He continues on xarelto for CHADsVasc 3 and cardizem 180 mg and lopressor 12.5 mg BID. Also follows for PAD. He has OSA and on CPAP.   He has a diagnosis of insomnia was on xanax but taking rarely.    In Sept 2014 , Patient had TUR of a Bladder cancer by Dr Tresa Moore and follows routinely.   BMI is Body mass index is 27.47 kg/m., he has been working on diet and exercise, golfing 2-3 days a week and riding bike some, plans to increase. Has been doing oatmeal for breakfast.  Wt Readings from Last 3 Encounters:  11/08/19 186 lb (84.4 kg)  10/03/19 190 lb 6.4 oz (86.4 kg)  09/23/19 190 lb (86.2 kg)   His blood pressure has been controlled at home, today their BP is BP: 108/64  He does workout- [place] and golfing several times a week. He denies chest pain, shortness of breath, dizziness.   He is on cholesterol medication (currently taking crestor 20 mg daily) and denies myalgias. His cholesterol is at goal. The cholesterol last visit was:   Lab Results  Component Value Date   CHOL 108 08/04/2019   HDL 48 08/04/2019   [place]  45 08/04/2019   TRIG 70 08/04/2019   CHOLHDL 2.3 08/04/2019    He has not been working on diet and exercise for glucose management, and denies increased appetite, nausea, paresthesia of the feet, polydipsia, polyuria, visual disturbances and vomiting. Last A1C in the office was:  Lab Results  Component Value  Date   HGBA1C 5.5 08/04/2019   Patient is on Vitamin D supplement and at goal at recent check:   Lab Results  Component Value Date   VD25OH 94 08/04/2019        Current Medications:  Current Outpatient Medications on File Prior to Visit  Medication Sig  . bisoprolol-hydrochlorothiazide (ZIAC) 10-6.25 MG tablet Take 1 tablet Daily for BP (Patient taking differently: Take 1 tablet by mouth daily. )  . Cholecalciferol (VITAMIN D3) 125 MCG (5000 UT) CAPS Take 10,000 units Daily (Patient taking differently: Take 5,000-10,000 Units by mouth daily. )  . diltiazem (CARTIA XT) 180 MG 24 hr capsule Take 1 capsule by mouth once daily (Patient taking differently: Take 180 mg by mouth daily. )  . ezetimibe (ZETIA) 10 MG tablet Take 1 tablet Daily for Cholesterol (Patient taking differently: Take 10 mg by mouth daily. )  . metoprolol tartrate (LOPRESSOR) 25 MG tablet Take 0.5 tablets (12.5 mg total) by mouth 2 (two) times daily.  . Multiple Vitamin (MULTIVITAMIN WITH MINERALS) TABS tablet Take 1 tablet Daily  . nitroGLYCERIN (NITROSTAT) 0.4 MG SL tablet DISSOLVE ONE TABLET UNDER THE TONGUE EVERY 5 MINUTES AS NEEDED FOR CHEST PAIN.  DO NOT EXCEED A TOTAL OF 3 DOSES IN 15 MINUTES (Patient taking differently: Place 0.4 mg under the tongue every 5 (five) minutes as needed for chest pain. DISSOLVE ONE TABLET UNDER THE TONGUE EVERY 5 MINUTES AS NEEDED FOR CHEST PAIN.  DO NOT EXCEED A TOTAL OF 3 DOSES IN 15 MINUTES)  . Polyvinyl Alcohol-Povidone (REFRESH OP) Place 1 drop into both eyes daily as needed (dry eyes).  . rivaroxaban (XARELTO) 20 MG TABS tablet TAKE 1 TABLET BY MOUTH ONCE DAILY WITH SUPPER (Patient taking differently: Take 20 mg by mouth daily with supper. )  . rosuvastatin (CRESTOR) 40 MG tablet Take 1 tablet Daily for Cholesterol  . zinc gluconate 50 MG tablet Take 50 mg by mouth daily.   No current facility-administered medications on file prior to visit.     Allergies:  No Known Allergies    Medical History:  Past Medical History:  Diagnosis Date  . Bladder cancer ([place])   . Hearing loss of both ears   . History of colon polyps    BENIGN  . Hyperlipidemia   . Hypertension   . Mitral regurgitation   . OSA on CPAP    cpap settign of 13  . PAD (peripheral artery disease) (HCC)    ABI--  RIGHT SIDE NORMAL LEFT SIDE MODERATELY REDUCED W/ DISTAL LEFT SFA STENOSIS//  MILD CALDICATION  . PAF (paroxysmal atrial fibrillation) ([place])     a. s/p PVI 2014; b. s/p redo PVI and CTI 01/2015  . Pre-diabetes   . Vitamin D deficiency    Family history- Reviewed and unchanged Social history- Reviewed and unchanged   Review of Systems:  Review of Systems  Constitutional: Negative for malaise/fatigue and weight loss.  HENT: Negative for hearing loss and tinnitus.   Eyes: Negative for blurred vision and double vision.  Respiratory: Negative for cough, shortness of breath and wheezing.   Cardiovascular: Negative for chest pain, palpitations, orthopnea, claudication and leg swelling.  Gastrointestinal: Negative for abdominal pain, blood in stool, constipation, diarrhea, heartburn, melena, nausea and vomiting.  Genitourinary: Negative.   Musculoskeletal: Positive for back pain (chronic intermittent lumbar aching, has seen ortho). Negative for joint pain and myalgias.  Skin: Negative for rash.  Neurological: Negative for dizziness, tingling, sensory change, weakness and headaches.  Endo/Heme/Allergies: Negative for polydipsia.  Psychiatric/Behavioral: Negative.   All other systems reviewed and are negative.    Physical Exam: BP 108/64   Pulse (!) 55   Temp 97.7 F (36.5 C)   Ht 5\' 9"  (1.753 m)   Wt 186 lb (84.4 kg)   SpO2 95%   BMI 27.47 kg/m  Wt Readings from Last 3 Encounters:  11/08/19 186 lb (84.4 kg)  10/03/19 190 lb 6.4 oz (86.4 kg)  09/23/19 190 lb (86.2 kg)   General Appearance: Well nourished, in no apparent distress. Eyes: PERRLA, EOMs, conjunctiva no swelling or  erythema Sinuses: No Frontal/maxillary tenderness ENT/Mouth: Ext aud canals clear, TMs without erythema, bulging. No erythema, swelling, or exudate on post pharynx.  Tonsils not swollen or erythematous. Hearing normal.  Neck: Supple, thyroid normal.  Respiratory: Respiratory effort normal, BS equal bilaterally without rales, rhonchi, wheezing or stridor.  Cardio: RRR with no MRGs. Brisk peripheral pulses without edema.  Abdomen: Soft, + BS.  Non tender, no guarding, rebound, hernias, masses. Lymphatics: Non tender without lymphadenopathy.  Musculoskeletal: Full ROM, 5/5 strength, Normal gait Skin: Warm, dry without rashes, lesions, ecchymosis.  Neuro: Cranial nerves intact. No cerebellar symptoms.  Psych: Awake and oriented X 3, normal affect, Insight and Judgment appropriate.    Izora Ribas, NP 9:40 AM [facility]

## 2019-11-08 ENCOUNTER — Other Ambulatory Visit: Payer: Self-pay

## 2019-11-08 ENCOUNTER — Encounter: Payer: Self-pay | Admitting: Adult Health

## 2019-11-08 ENCOUNTER — Ambulatory Visit: Payer: Medicare Other | Admitting: Adult Health

## 2019-11-08 VITALS — BP 108/64 | HR 55 | Temp 97.7°F | Ht 69.0 in | Wt 186.0 lb

## 2019-11-08 DIAGNOSIS — E559 Vitamin D deficiency, unspecified: Secondary | ICD-10-CM

## 2019-11-08 DIAGNOSIS — E782 Mixed hyperlipidemia: Secondary | ICD-10-CM

## 2019-11-08 DIAGNOSIS — R7309 Other abnormal glucose: Secondary | ICD-10-CM

## 2019-11-08 DIAGNOSIS — I2510 Atherosclerotic heart disease of native coronary artery without angina pectoris: Secondary | ICD-10-CM | POA: Diagnosis not present

## 2019-11-08 DIAGNOSIS — I480 Paroxysmal atrial fibrillation: Secondary | ICD-10-CM | POA: Diagnosis not present

## 2019-11-08 DIAGNOSIS — I10 Essential (primary) hypertension: Secondary | ICD-10-CM | POA: Diagnosis not present

## 2019-11-08 DIAGNOSIS — I739 Peripheral vascular disease, unspecified: Secondary | ICD-10-CM

## 2019-11-08 DIAGNOSIS — C679 Malignant neoplasm of bladder, unspecified: Secondary | ICD-10-CM

## 2019-11-08 DIAGNOSIS — E663 Overweight: Secondary | ICD-10-CM

## 2019-11-08 LAB — CBC WITH DIFFERENTIAL/PLATELET
Absolute Monocytes: 543 cells/uL (ref 200–950)
Basophils Absolute: 47 cells/uL (ref 0–200)
Basophils Relative: 0.8 %
Eosinophils Absolute: 159 cells/uL (ref 15–500)
Eosinophils Relative: 2.7 %
HCT: 44.9 % (ref 38.5–50.0)
Hemoglobin: 15.1 g/dL (ref 13.2–17.1)
Lymphs Abs: 1263 cells/uL (ref 850–3900)
MCH: 30.4 pg (ref 27.0–33.0)
MCHC: 33.6 g/dL (ref 32.0–36.0)
MCV: 90.5 fL (ref 80.0–100.0)
MPV: 9.9 fL (ref 7.5–12.5)
Monocytes Relative: 9.2 %
Neutro Abs: 3888 cells/uL (ref 1500–7800)
Neutrophils Relative %: 65.9 %
Platelets: 203 10*3/uL (ref 140–400)
RBC: 4.96 10*6/uL (ref 4.20–5.80)
RDW: 12.1 % (ref 11.0–15.0)
Total Lymphocyte: 21.4 %
WBC: 5.9 10*3/uL (ref 3.8–10.8)

## 2019-11-08 LAB — COMPLETE METABOLIC PANEL WITH GFR
AG Ratio: 1.9 (calc) (ref 1.0–2.5)
ALT: 33 U/L (ref 9–46)
AST: 22 U/L (ref 10–35)
Albumin: 4.2 g/dL (ref 3.6–5.1)
Alkaline phosphatase (APISO): 56 U/L (ref 35–144)
BUN: 12 mg/dL (ref 7–25)
CO2: 28 mmol/L (ref 20–32)
Calcium: 9.6 mg/dL (ref 8.6–10.3)
Chloride: 110 mmol/L (ref 98–110)
Creat: 0.79 mg/dL (ref 0.70–1.18)
GFR, Est African American: 103 mL/min/{1.73_m2} (ref >=60)
GFR, Est Non African American: 88 mL/min/{1.73_m2} (ref >=60)
Globulin: 2.2 g/dL (calc) (ref 1.9–3.7)
Glucose, Bld: 98 mg/dL (ref 65–99)
Potassium: 4.1 mmol/L (ref 3.5–5.3)
Sodium: 144 mmol/L (ref 135–146)
Total Bilirubin: 1.5 mg/dL — ABNORMAL HIGH (ref 0.2–1.2)
Total Protein: 6.4 g/dL (ref 6.1–8.1)

## 2019-11-08 LAB — LIPID PANEL
Cholesterol: 109 mg/dL (ref <200)
HDL: 54 mg/dL (ref >=40)
LDL Cholesterol (Calc): 42 mg/dL (calc)
Non-HDL Cholesterol (Calc): 55 mg/dL (calc) (ref <130)
Total CHOL/HDL Ratio: 2 (calc) (ref <5.0)
Triglycerides: 52 mg/dL (ref <150)

## 2019-11-08 LAB — TSH: TSH: 0.71 mIU/L (ref 0.40–4.50)

## 2019-11-08 LAB — MAGNESIUM: Magnesium: 2.2 mg/dL (ref 1.5–2.5)

## 2019-11-08 NOTE — Patient Instructions (Signed)
Goals    . Blood Pressure < 130/80    . LDL CALC < 70    . Weight (lb) < 180 lb (81.6 kg)          Back Exercises These exercises help to make your trunk and back strong. They also help to keep the lower back flexible. Doing these exercises can help to prevent back pain or lessen existing pain.  If you have back pain, try to do these exercises 2-3 times each day or as told by your doctor.  As you get better, do the exercises once each day. Repeat the exercises more often as told by your doctor.  To stop back pain from coming back, do the exercises once each day, or as told by your doctor. Exercises Single knee to chest Do these steps 3-5 times in a row for each leg: 1. Lie on your back on a firm bed or the floor with your legs stretched out. 2. Bring one knee to your chest. 3. Grab your knee or thigh with both hands and hold them it in place. 4. Pull on your knee until you feel a gentle stretch in your lower back or buttocks. 5. Keep doing the stretch for 10-30 seconds. 6. Slowly let go of your leg and straighten it. Pelvic tilt Do these steps 5-10 times in a row: 1. Lie on your back on a firm bed or the floor with your legs stretched out. 2. Bend your knees so they point up to the ceiling. Your feet should be flat on the floor. 3. Tighten your lower belly (abdomen) muscles to press your lower back against the floor. This will make your tailbone point up to the ceiling instead of pointing down to your feet or the floor. 4. Stay in this position for 5-10 seconds while you gently tighten your muscles and breathe evenly. Cat-cow Do these steps until your lower back bends more easily: 1. Get on your hands and knees on a firm surface. Keep your hands under your shoulders, and keep your knees under your hips. You may put padding under your knees. 2. Let your head hang down toward your chest. Tighten (contract) the muscles in your belly. Point your tailbone toward the floor so your lower  back becomes rounded like the back of a cat. 3. Stay in this position for 5 seconds. 4. Slowly lift your head. Let the muscles of your belly relax. Point your tailbone up toward the ceiling so your back forms a sagging arch like the back of a cow. 5. Stay in this position for 5 seconds.  Press-ups Do these steps 5-10 times in a row: 1. Lie on your belly (face-down) on the floor. 2. Place your hands near your head, about shoulder-width apart. 3. While you keep your back relaxed and keep your hips on the floor, slowly straighten your arms to raise the top half of your body and lift your shoulders. Do not use your back muscles. You may change where you place your hands in order to make yourself more comfortable. 4. Stay in this position for 5 seconds. 5. Slowly return to lying flat on the floor.  Bridges Do these steps 10 times in a row: 1. Lie on your back on a firm surface. 2. Bend your knees so they point up to the ceiling. Your feet should be flat on the floor. Your arms should be flat at your sides, next to your body. 3. Tighten your butt muscles and lift  your butt off the floor until your waist is almost as high as your knees. If you do not feel the muscles working in your butt and the back of your thighs, slide your feet 1-2 inches farther away from your butt. 4. Stay in this position for 3-5 seconds. 5. Slowly lower your butt to the floor, and let your butt muscles relax. If this exercise is too easy, try doing it with your arms crossed over your chest. Belly crunches Do these steps 5-10 times in a row: 1. Lie on your back on a firm bed or the floor with your legs stretched out. 2. Bend your knees so they point up to the ceiling. Your feet should be flat on the floor. 3. Cross your arms over your chest. 4. Tip your chin a little bit toward your chest but do not bend your neck. 5. Tighten your belly muscles and slowly raise your chest just enough to lift your shoulder blades a tiny bit  off of the floor. Avoid raising your body higher than that, because it can put too much stress on your low back. 6. Slowly lower your chest and your head to the floor. Back lifts Do these steps 5-10 times in a row: 1. Lie on your belly (face-down) with your arms at your sides, and rest your forehead on the floor. 2. Tighten the muscles in your legs and your butt. 3. Slowly lift your chest off of the floor while you keep your hips on the floor. Keep the back of your head in line with the curve in your back. Look at the floor while you do this. 4. Stay in this position for 3-5 seconds. 5. Slowly lower your chest and your face to the floor. Contact a doctor if:  Your back pain gets a lot worse when you do an exercise.  Your back pain does not get better 2 hours after you exercise. If you have any of these problems, stop doing the exercises. Do not do them again unless your doctor says it is okay. Get help right away if:  You have sudden, very bad back pain. If this happens, stop doing the exercises. Do not do them again unless your doctor says it is okay. This information is not intended to replace advice given to you by your health care provider. Make sure you discuss any questions you have with your health care provider. Document Revised: 04/01/2018 Document Reviewed: 04/01/2018 Elsevier Patient Education  2020 [employer].

## 2020-02-13 ENCOUNTER — Encounter: Payer: Self-pay | Admitting: Internal Medicine

## 2020-02-13 MED ORDER — EZETIMIBE 10 MG PO TABS: ORAL_TABLET | ORAL | 3 refills | Status: DC

## 2020-02-13 MED ORDER — NITROGLYCERIN 0.4 MG SL SUBL: SUBLINGUAL_TABLET | SUBLINGUAL | 6 refills | Status: AC

## 2020-02-13 MED ORDER — METOPROLOL TARTRATE 25 MG PO TABS: ORAL_TABLET | ORAL | 0 refills | Status: DC

## 2020-02-13 MED ORDER — RIVAROXABAN 20 MG PO TABS: ORAL_TABLET | ORAL | Status: DC

## 2020-02-13 MED ORDER — BISOPROLOL-HYDROCHLOROTHIAZIDE 10-6.25 MG PO TABS: ORAL_TABLET | ORAL | 3 refills | Status: DC

## 2020-02-13 MED ORDER — DILTIAZEM HCL ER COATED BEADS 180 MG PO CP24: ORAL_CAPSULE | ORAL | 2 refills | Status: DC

## 2020-02-13 MED ORDER — VITAMIN D3 125 MCG (5000 UT) PO CAPS: ORAL_CAPSULE | ORAL | Status: AC

## 2020-02-13 NOTE — Progress Notes (Signed)
History of Present Illness:       This very nice 74 y.o.  MWM presents for 6 month follow up with HTN, ASCAD,  HLD, Pre-Diabetes and Vitamin D Deficiency. Patient also has hx/po OSA w/mask intolerance.      Patient is treated for HTN (1996) & BP has been controlled at home. Today's BP is 104/64.      Patient has cardiac hx/o  PCA/Stenting & Rotator Ablation (2015), hx/o pAfib s/p multiple ablations & CV's (last March 2021).  He remains on Xarelto for CHADsVasc 3.       Patient has had no recent complaints of any cardiac type chest pain, palpitations, dyspnea / orthopnea / PND, dizziness, claudication, or dependent edema.      Hyperlipidemia is controlled with diet & meds. Patient denies myalgias or other med SE's. Last Lipids were at goal:  Lab Results  Component Value Date   CHOL 109 11/08/2019   HDL 54 11/08/2019   LDLCALC 42 11/08/2019   TRIG 52 11/08/2019   CHOLHDL 2.0 11/08/2019    Also, the patient has history of PreDiabetes (A1c 5.7%/ 2011) and has had no symptoms of reactive hypoglycemia, diabetic polys, paresthesias or visual blurring.  Last A1c was Normal & at goal:  Lab Results  Component Value Date   HGBA1C 5.5 08/04/2019           Further, the patient also has history of Vitamin D Deficiency ("27" / 2008) and supplements vitamin D without any suspected side-effects. Last vitamin D was at goal:  Lab Results  Component Value Date   VD25OH 94 08/04/2019   MEDS  .  bisoprolol-hydrochlorothiazide (ZIAC) 10-6.25 MG tablet, Take 1 tablet Daily for BP .  diltiazem (CARTIA XT) 180 MG 24 hr capsule, Take 1 capsule by mouth once daily .  ezetimibe (ZETIA) 10 MG tablet, Take 1 tablet Daily for Cholesterol .  metoprolol tartrate (LOPRESSOR) 25 MG tablet, Take 1/2 tablet 2 x /day for BP & Heart .  nitroGLYCERIN (NITROSTAT) 0.4 MG SL tablet,  AS NEEDED .  rosuvastatin (CRESTOR) 40 MG tablet, Take 1 tablet Daily for Cholesterol .  rivaroxaban (XARELTO) 20 MG TABS  tablet, Take 1 tablet Daily to Prevent Blood Clots .  VITAMIN D125 MCG (5000 UT) CAPS, Take 10,000 units Daily .  Multiple Vitamin  TABS tablet, Take 1 tablet Daily .   (REFRESH OP) Place 1 drop into both eyes daily as needed (dry eyes). .  zinc gluconate 50 MG tablet, Take 50 mg by mouth daily.  No Known Allergies  PMHx:   Past Medical History:  Diagnosis Date  . Bladder cancer ([place])   . Hearing loss of both ears   . History of colon polyps    BENIGN  . Hyperlipidemia   . Hypertension   . Mitral regurgitation   . OSA on CPAP    cpap settign of 13  . PAD (peripheral artery disease) (HCC)    ABI--  RIGHT SIDE NORMAL LEFT SIDE MODERATELY REDUCED W/ DISTAL LEFT SFA STENOSIS//  MILD CALDICATION  . PAF (paroxysmal atrial fibrillation) ([place])     a. s/p PVI 2014; b. s/p redo PVI and CTI 01/2015  . Pre-diabetes   . Vitamin D deficiency     Immunization History  Administered Date(s) Administered  . DT (Pediatric) 07/22/2003, 12/28/2013  . Influenza Split 04/14/2019  . Influenza Whole 05/19/2013  . Influenza, High Dose Seasonal PF 04/08/2017  . Influenza,inj,Quad PF,6+ Mos 05/09/2014  .  Influenza-Unspecified 05/14/2015, 05/01/2016, 05/17/2018  . PFIZER SARS-COV-2 Vaccination 08/18/2019, 09/15/2019  . Pneumococcal Conjugate-13 07/11/2014  . Pneumococcal Polysaccharide-23 12/04/2009, 06/10/2016  . Tdap 09/22/2013    Past Surgical History:  Procedure Laterality Date  . ABLATION OF DYSRHYTHMIC FOCUS  01/26/2015  . ATRIAL FIBRILLATION ABLATION N/A 07/27/2012   Procedure: ATRIAL FIBRILLATION ABLATION;  Surgeon: [name], MD;  Location: [facility] CATH LAB;  Service: Cardiovascular;  Laterality: N/A;  . ATRIAL FIBRILLATION ABLATION N/A 08/25/2018   Procedure: ATRIAL FIBRILLATION ABLATION;  Surgeon: [name], MD;  Location: [place] CV LAB;  Service: Cardiovascular;  Laterality: N/A;  . CARDIAC ELECTROPHYSIOLOGY STUDY AND ABLATION  07-27-2012  DR ALLRED   SUCCESSFUL ABLATION OF A-FIB   . CARDIOVERSION  08/06/2012   Procedure: CARDIOVERSION;  Surgeon: [name], MD;  Location: [place];  Service: Cardiovascular;  Laterality: N/A;  . CARDIOVERSION N/A 08/25/2014   Procedure: CARDIOVERSION;  Surgeon: Larey Dresser, MD;  Location: [place];  Service: Cardiovascular;  Laterality: N/A;  . CARDIOVERSION N/A 11/13/2014   Procedure: CARDIOVERSION;  Surgeon: Sueanne Margarita, MD;  Location: [facility] ENDOSCOPY;  Service: Cardiovascular;  Laterality: N/A;  . CARDIOVERSION N/A 12/28/2014   Procedure: CARDIOVERSION;  Surgeon: [name], MD;  Location: [place];  Service: Cardiovascular;  Laterality: N/A;  . CARDIOVERSION N/A 01/30/2017   Procedure: CARDIOVERSION;  Surgeon: Josue Hector, MD;  Location: [facility] ENDOSCOPY;  Service: Cardiovascular;  Laterality: N/A;  . CARDIOVERSION N/A 04/06/2017   Procedure: CARDIOVERSION;  Surgeon: Dorothy Spark, MD;  Location: [facility] ENDOSCOPY;  Service: Cardiovascular;  Laterality: N/A;  . CARDIOVERSION N/A 05/25/2018   Procedure: CARDIOVERSION;  Surgeon: Skeet Latch, MD;  Location: [place];  Service: Cardiovascular;  Laterality: N/A;  . CARDIOVERSION N/A 08/09/2018   Procedure: CARDIOVERSION;  Surgeon: Buford Dresser, MD;  Location: [facility] ENDOSCOPY;  Service: Cardiovascular;  Laterality: N/A;  . CARDIOVERSION N/A 07/04/2019   Procedure: CARDIOVERSION;  Surgeon: Dorothy Spark, MD;  Location: [facility] ENDOSCOPY;  Service: Cardiovascular;  Laterality: N/A;  . CARDIOVERSION N/A 09/23/2019   Procedure: CARDIOVERSION;  Surgeon: Dorothy Spark, MD;  Location: [place];  Service: Cardiovascular;  Laterality: N/A;  . CATARACT EXTRACTION W/ INTRAOCULAR LENS  IMPLANT, BILATERAL    . COLONOSCOPY WITH PROPOFOL N/A 02/13/2015   Procedure: COLONOSCOPY WITH PROPOFOL;  Surgeon: Garlan Fair, MD;  Location: [facility] ENDOSCOPY;  Service: Endoscopy;  Laterality: N/A;  . CYSTOSCOPY W/ RETROGRADES Bilateral 04/12/2013   Procedure: CYSTOSCOPY WITH  RETROGRADE PYELOGRAM;  Surgeon: [name], MD;  Location: [facility];  Service: Urology;  Laterality: Bilateral;  . ELECTROPHYSIOLOGIC STUDY N/A 01/26/2015   Procedure: Atrial Fibrillation Ablation;  Surgeon: [name], MD;  Location: [place] CV LAB;  Service: Cardiovascular;  Laterality: N/A;  . LEFT HEART CATHETERIZATION WITH CORONARY ANGIOGRAM N/A 11/24/2013   Procedure: LEFT HEART CATHETERIZATION WITH CORONARY ANGIOGRAM;  Surgeon: [name], MD;  Location: [facility] CATH LAB;  Service: Cardiovascular;  Laterality: N/A;  . PERCUTANEOUS CORONARY ROTOBLATOR INTERVENTION (PCI-R) N/A 11/28/2013   Procedure: PERCUTANEOUS CORONARY ROTOBLATOR INTERVENTION (PCI-R);  Surgeon: [name], MD;  Location: [facility] CATH LAB;  Service: Cardiovascular;  Laterality: N/A;  . PERCUTANEOUS CORONARY STENT INTERVENTION (PCI-S)  11/24/2013   Procedure: PERCUTANEOUS CORONARY STENT INTERVENTION (PCI-S);  Surgeon: [name], MD;  Location: [facility] CATH LAB;  Service: Cardiovascular;;  . TEE WITHOUT CARDIOVERSION  07/26/2012   Procedure: TRANSESOPHAGEAL ECHOCARDIOGRAM (TEE);  Surgeon: [name], MD;  Location: [place];  Service: Cardiovascular;  Laterality: N/A;  . TEE WITHOUT CARDIOVERSION N/A 08/25/2014   Procedure: TRANSESOPHAGEAL ECHOCARDIOGRAM (TEE);  Surgeon: Larey Dresser, MD;  Location: [place];  Service: Cardiovascular;  Laterality: N/A;  . TEE WITHOUT CARDIOVERSION  01/26/2015   Procedure: Transesophageal Echocardiogram (Tee);  Surgeon: [name], MD;  Location: [place] CV LAB;  Service: Cardiovascular;;  . TEE WITHOUT CARDIOVERSION N/A 07/04/2019   Procedure: TRANSESOPHAGEAL ECHOCARDIOGRAM (TEE);  Surgeon: Dorothy Spark, MD;  Location: [facility] ENDOSCOPY;  Service: Cardiovascular;  Laterality: N/A;  . TONSILLECTOMY  age 61  . TOTAL HIP ARTHROPLASTY  07/25/2011   Procedure: TOTAL HIP ARTHROPLASTY ANTERIOR APPROACH;  Surgeon: Mcarthur Rossetti;   Location: [facility] ORS;  Service: Orthopedics;  Laterality: Right;  . TOTAL HIP ARTHROPLASTY Left 02-08-2010  . TRANSURETHRAL RESECTION OF BLADDER TUMOR WITH GYRUS (TURBT-GYRUS) N/A 04/12/2013   Procedure: TRANSURETHRAL RESECTION OF BLADDER TUMOR WITH GYRUS (TURBT-GYRUS);  Surgeon: [name], MD;  Location: [facility];  Service: Urology;  Laterality: N/A;    FHx:    Reviewed / unchanged  SHx:    Reviewed / unchanged   Systems Review:  Constitutional: Denies fever, chills, wt changes, headaches, insomnia, fatigue, night sweats, change in appetite. Eyes: Denies redness, blurred vision, diplopia, discharge, itchy, watery eyes.  ENT: Denies discharge, congestion, post nasal drip, epistaxis, sore throat, earache, hearing loss, dental pain, tinnitus, vertigo, sinus pain, snoring.  CV: Denies chest pain, palpitations, irregular heartbeat, syncope, dyspnea, diaphoresis, orthopnea, PND, claudication or edema. Respiratory: denies cough, dyspnea, DOE, pleurisy, hoarseness, laryngitis, wheezing.  Gastrointestinal: Denies dysphagia, odynophagia, heartburn, reflux, water brash, abdominal pain or cramps, nausea, vomiting, bloating, diarrhea, constipation, hematemesis, melena, hematochezia  or hemorrhoids. Genitourinary: Denies dysuria, frequency, urgency, nocturia, hesitancy, discharge, hematuria or flank pain. Musculoskeletal: Denies arthralgias, myalgias, stiffness, jt. swelling, pain, limping or strain/sprain.  Skin: Denies pruritus, rash, hives, warts, acne, eczema or change in skin lesion(s). Neuro: No weakness, tremor, incoordination, spasms, paresthesia or pain. Psychiatric: Denies confusion, memory loss or sensory loss. Endo: Denies change in weight, skin or hair change.  Heme/Lymph: No excessive bleeding, bruising or enlarged lymph nodes.  Physical Exam  BP (!) 104/62   Pulse 56   Temp (!) 97 F (36.1 C)   Resp 16   Ht 5\' 9"  (1.753 m)   Wt 185 lb 3.2 oz (84 kg)   BMI 27.35  kg/m   Appears  Over nourished, well groomed  and in no distress.  Eyes: PERRLA, EOMs, conjunctiva no swelling or erythema. Sinuses: No frontal/maxillary tenderness ENT/Mouth: EAC's clear, TM's nl w/o erythema, bulging. Nares clear w/o erythema, swelling, exudates. Oropharynx clear without erythema or exudates. Oral hygiene is good. Tongue normal, non obstructing. Hearing intact.  Neck: Supple. Thyroid not palpable. Car 2+/2+ without bruits, nodes or JVD. Chest: Respirations nl with BS clear & equal w/o rales, rhonchi, wheezing or stridor.  Cor: Heart sounds normal w/ regular rate and rhythm without sig. murmurs, gallops, clicks or rubs. Peripheral pulses normal and equal  without edema.  Abdomen: Soft & bowel sounds normal. Non-tender w/o guarding, rebound, hernias, masses or organomegaly.  Lymphatics: Unremarkable.  Musculoskeletal: Full ROM all peripheral extremities, joint stability, 5/5 strength and normal gait.  Skin: Warm, dry without exposed rashes, lesions or ecchymosis apparent.  Neuro: Cranial nerves intact, reflexes equal bilaterally. Sensory-motor testing grossly intact. Tendon reflexes grossly intact.  Pysch: Alert & oriented x 3.  Insight and judgement nl & appropriate. No ideations.  Assessment and Plan:  1. Essential hypertension  -  Continue medication, monitor blood pressure at home.  - Continue DASH diet.  Reminder to go to the ER if any CP,  SOB, nausea, dizziness, severe HA, changes vision/speech.  - bisoprolol-hydrochlorothiazide (ZIAC) 10-6.25 MG tablet; Take 1 tablet Daily for BP  Dispense: 90 tablet; Refill: 3 - diltiazem (CARTIA XT) 180 MG 24 hr capsule; Take 1 capsule by mouth once daily  Dispense: 90 capsule; Refill: 2 - metoprolol tartrate (LOPRESSOR) 25 MG tablet; Take 1/2 tablet 2 x /day for BP & Heart  Dispense: 90 tablet; Refill: 0 - CBC with Differential/Platelet - COMPLETE METABOLIC PANEL WITH GFR - Magnesium - TSH  2. Hyperlipidemia, mixed  -  Continue diet/meds, exercise,& lifestyle modifications.  - Continue monitor periodic cholesterol/liver & renal functions   - ezetimibe  10 MG tablet; Take 1 tablet Daily for Cholesterol  Disp: 90 tab; Rf: 3 - Lipid panel - TSH  3. Abnormal glucose  - Hemoglobin A1c - Insulin, random  4. Vitamin D deficiency  - Continue diet, exercise  - Lifestyle modifications.  - Monitor appropriate labs. - Continue supplementation.  - VITAMIN D  5000 U ; Take 10,000 units Daily  Disp: 30 capsule - VITAMIN D 25 Hydroxyl  5. Atherosclerosis of native coronary artery without angina pectoris  - bisoprolol-hydrochlorothiazide (ZIAC) 10-6.25 MG tablet; Take 1 tablet Daily for BP  Dispense: 90 tablet; Refill: 3 - diltiazem (CARTIA XT) 180 MG 24 hr capsule; Take 1 capsule by mouth once daily  Dispense: 90 capsule; Refill: 2 - ezetimibe (ZETIA) 10 MG tablet; Take 1 tablet Daily for Cholesterol  Dispense: 90 tablet; Refill: 3 - metoprolol tartrate (LOPRESSOR) 25 MG tablet; Take 1/2 tablet 2 x /day for BP & Heart  Dispense: 90 tablet; Refill: 0 - nitroGLYCERIN (NITROSTAT) 0.4 MG SL tablet; DISSOLVE ONE TABLET UNDER THE TONGUE EVERY 5 MINUTES AS NEEDED FOR CHEST PAIN.DO NOT EXCEED A TOTAL OF 3 DOSES IN 15 MINUTES  Dispense: 25 tablet; Refill: 6 - rivaroxaban (XARELTO) 20 MG TABS tablet; Take 1 tablet Daily to Prevent Blood Clots  Dispense: 90 tablet; Refill: o - Lipid panel  6. Paroxysmal atrial fibrillation (HCC)  - TSH  7. Medication management  - CBC with Differential/Platelet - COMPLETE METABOLIC PANEL WITH GFR - Magnesium - Lipid panel - TSH - Hemoglobin A1c - Insulin, random - VITAMIN D 25 Hydroxy       Discussed  regular exercise, BP monitoring, weight control to achieve/maintain BMI less than 25 and discussed med and SE's. Recommended labs to assess and monitor clinical status with further disposition pending results of labs.  I discussed the assessment and treatment plan with the  patient. The patient was provided an opportunity to ask questions and all were answered. The patient agreed with the plan and demonstrated an understanding of the instructions.  I provided over 30 minutes of exam, counseling, chart review and  complex critical decision making.   Kirtland Bouchard, MD

## 2020-02-13 NOTE — Patient Instructions (Signed)

## 2020-02-14 ENCOUNTER — Ambulatory Visit: Payer: Medicare Other | Admitting: Internal Medicine

## 2020-02-14 ENCOUNTER — Other Ambulatory Visit: Payer: Self-pay

## 2020-02-14 VITALS — BP 104/62 | HR 56 | Temp 97.0°F | Resp 16 | Ht 69.0 in | Wt 185.2 lb

## 2020-02-14 DIAGNOSIS — I10 Essential (primary) hypertension: Secondary | ICD-10-CM

## 2020-02-14 DIAGNOSIS — R7309 Other abnormal glucose: Secondary | ICD-10-CM

## 2020-02-14 DIAGNOSIS — I2510 Atherosclerotic heart disease of native coronary artery without angina pectoris: Secondary | ICD-10-CM

## 2020-02-14 DIAGNOSIS — E782 Mixed hyperlipidemia: Secondary | ICD-10-CM | POA: Diagnosis not present

## 2020-02-14 DIAGNOSIS — E559 Vitamin D deficiency, unspecified: Secondary | ICD-10-CM

## 2020-02-14 DIAGNOSIS — Z79899 Other long term (current) drug therapy: Secondary | ICD-10-CM

## 2020-02-14 DIAGNOSIS — I480 Paroxysmal atrial fibrillation: Secondary | ICD-10-CM

## 2020-02-15 LAB — CBC WITH DIFFERENTIAL/PLATELET
Absolute Monocytes: 555 cells/uL (ref 200–950)
Basophils Absolute: 43 cells/uL (ref 0–200)
Basophils Relative: 0.7 %
Eosinophils Absolute: 183 cells/uL (ref 15–500)
Eosinophils Relative: 3 %
HCT: 45.4 % (ref 38.5–50.0)
Hemoglobin: 15.1 g/dL (ref 13.2–17.1)
Lymphs Abs: 1318 cells/uL (ref 850–3900)
MCH: 29.8 pg (ref 27.0–33.0)
MCHC: 33.3 g/dL (ref 32.0–36.0)
MCV: 89.7 fL (ref 80.0–100.0)
MPV: 10.1 fL (ref 7.5–12.5)
Monocytes Relative: 9.1 %
Neutro Abs: 4002 cells/uL (ref 1500–7800)
Neutrophils Relative %: 65.6 %
Platelets: 198 10*3/uL (ref 140–400)
RBC: 5.06 10*6/uL (ref 4.20–5.80)
RDW: 12.3 % (ref 11.0–15.0)
Total Lymphocyte: 21.6 %
WBC: 6.1 10*3/uL (ref 3.8–10.8)

## 2020-02-15 LAB — INSULIN, RANDOM: Insulin: 6.7 u[IU]/mL

## 2020-02-15 LAB — LIPID PANEL
Cholesterol: 126 mg/dL (ref <200)
HDL: 56 mg/dL (ref >=40)
LDL Cholesterol (Calc): 56 mg/dL (calc)
Non-HDL Cholesterol (Calc): 70 mg/dL (calc) (ref <130)
Total CHOL/HDL Ratio: 2.3 (calc) (ref <5.0)
Triglycerides: 62 mg/dL (ref <150)

## 2020-02-15 LAB — COMPLETE METABOLIC PANEL WITH GFR
AG Ratio: 2 (calc) (ref 1.0–2.5)
ALT: 27 U/L (ref 9–46)
AST: 22 U/L (ref 10–35)
Albumin: 4.2 g/dL (ref 3.6–5.1)
Alkaline phosphatase (APISO): 56 U/L (ref 35–144)
BUN: 13 mg/dL (ref 7–25)
CO2: 29 mmol/L (ref 20–32)
Calcium: 9.5 mg/dL (ref 8.6–10.3)
Chloride: 109 mmol/L (ref 98–110)
Creat: 0.88 mg/dL (ref 0.70–1.18)
GFR, Est African American: 98 mL/min/{1.73_m2} (ref >=60)
GFR, Est Non African American: 85 mL/min/{1.73_m2} (ref >=60)
Globulin: 2.1 g/dL (calc) (ref 1.9–3.7)
Glucose, Bld: 89 mg/dL (ref 65–99)
Potassium: 4.1 mmol/L (ref 3.5–5.3)
Sodium: 143 mmol/L (ref 135–146)
Total Bilirubin: 2.1 mg/dL — ABNORMAL HIGH (ref 0.2–1.2)
Total Protein: 6.3 g/dL (ref 6.1–8.1)

## 2020-02-15 LAB — HEMOGLOBIN A1C
Hgb A1c MFr Bld: 5.3 % of total Hgb (ref <5.7)
Mean Plasma Glucose: 105 (calc)
eAG (mmol/L): 5.8 (calc)

## 2020-02-15 LAB — MAGNESIUM: Magnesium: 2.1 mg/dL (ref 1.5–2.5)

## 2020-02-15 LAB — TSH: TSH: 0.93 mIU/L (ref 0.40–4.50)

## 2020-02-15 LAB — VITAMIN D 25 HYDROXY (VIT D DEFICIENCY, FRACTURES): Vit D, 25-Hydroxy: 84 ng/mL (ref 30–100)

## 2020-02-15 NOTE — Progress Notes (Signed)
============================================================  -    Total Chol = 126 and LDL Chol = 56 - Both  Excellent   - Very low risk for Heart Attack  / Stroke =============================================================  - A1c - Normal - Great - No Diabetes ============================================================  -  Vitamin D = 84 - Excellent  ============================================================  -  All Else - CBC - Kidneys - Electrolytes - Liver - Magnesium & Thyroid    - all  Normal / OK ============================================================   - Keep up the [place] Work ! ============================================================

## 2020-03-16 ENCOUNTER — Encounter: Payer: Self-pay | Admitting: Internal Medicine

## 2020-03-16 ENCOUNTER — Other Ambulatory Visit: Payer: Self-pay

## 2020-03-16 ENCOUNTER — Ambulatory Visit (INDEPENDENT_AMBULATORY_CARE_PROVIDER_SITE_OTHER): Payer: Medicare Other | Admitting: Internal Medicine

## 2020-03-16 VITALS — BP 112/60 | HR 55 | Ht 69.0 in | Wt 188.4 lb

## 2020-03-16 DIAGNOSIS — I10 Essential (primary) hypertension: Secondary | ICD-10-CM

## 2020-03-16 DIAGNOSIS — I4819 Other persistent atrial fibrillation: Secondary | ICD-10-CM

## 2020-03-16 DIAGNOSIS — G4733 Obstructive sleep apnea (adult) (pediatric): Secondary | ICD-10-CM

## 2020-03-16 NOTE — Progress Notes (Signed)
PCP: [name], MD Primary Cardiologist: Dr Meda Coffee Primary EP: Dr [name]  Damon [name] is a 74 y.o. male who presents today for routine electrophysiology followup.  Since last being seen in our clinic, the patient reports doing very well.  Today, he denies symptoms of palpitations, chest pain, shortness of breath,  lower extremity edema, dizziness, presyncope, or syncope.  The patient is otherwise without complaint today.   Past Medical History:  Diagnosis Date  . Bladder cancer ([place])   . Hearing loss of both ears   . History of colon polyps    BENIGN  . Hyperlipidemia   . Hypertension   . Mitral regurgitation   . OSA on CPAP    cpap settign of 13  . PAD (peripheral artery disease) (HCC)    ABI--  RIGHT SIDE NORMAL LEFT SIDE MODERATELY REDUCED W/ DISTAL LEFT SFA STENOSIS//  MILD CALDICATION  . PAF (paroxysmal atrial fibrillation) ([place])     a. s/p PVI 2014; b. s/p redo PVI and CTI 01/2015  . Pre-diabetes   . Vitamin D deficiency    Past Surgical History:  Procedure Laterality Date  . ABLATION OF DYSRHYTHMIC FOCUS  01/26/2015  . ATRIAL FIBRILLATION ABLATION N/A 07/27/2012   Procedure: ATRIAL FIBRILLATION ABLATION;  Surgeon: [name], MD;  Location: [facility] CATH LAB;  Service: Cardiovascular;  Laterality: N/A;  . ATRIAL FIBRILLATION ABLATION N/A 08/25/2018   Procedure: ATRIAL FIBRILLATION ABLATION;  Surgeon: [name], MD;  Location: [place] CV LAB;  Service: Cardiovascular;  Laterality: N/A;  . CARDIAC ELECTROPHYSIOLOGY STUDY AND ABLATION  07-27-2012  DR Arlette Schaad   SUCCESSFUL ABLATION OF A-FIB  . CARDIOVERSION  08/06/2012   Procedure: CARDIOVERSION;  Surgeon: [name], MD;  Location: [place];  Service: Cardiovascular;  Laterality: N/A;  . CARDIOVERSION N/A 08/25/2014   Procedure: CARDIOVERSION;  Surgeon: Larey Dresser, MD;  Location: [place];  Service: Cardiovascular;  Laterality: N/A;  . CARDIOVERSION N/A 11/13/2014   Procedure: CARDIOVERSION;  Surgeon:  Sueanne Margarita, MD;  Location: [facility] ENDOSCOPY;  Service: Cardiovascular;  Laterality: N/A;  . CARDIOVERSION N/A 12/28/2014   Procedure: CARDIOVERSION;  Surgeon: [name], MD;  Location: [place];  Service: Cardiovascular;  Laterality: N/A;  . CARDIOVERSION N/A 01/30/2017   Procedure: CARDIOVERSION;  Surgeon: Josue Hector, MD;  Location: [facility] ENDOSCOPY;  Service: Cardiovascular;  Laterality: N/A;  . CARDIOVERSION N/A 04/06/2017   Procedure: CARDIOVERSION;  Surgeon: Dorothy Spark, MD;  Location: [facility] ENDOSCOPY;  Service: Cardiovascular;  Laterality: N/A;  . CARDIOVERSION N/A 05/25/2018   Procedure: CARDIOVERSION;  Surgeon: Skeet Latch, MD;  Location: [place];  Service: Cardiovascular;  Laterality: N/A;  . CARDIOVERSION N/A 08/09/2018   Procedure: CARDIOVERSION;  Surgeon: Buford Dresser, MD;  Location: [facility] ENDOSCOPY;  Service: Cardiovascular;  Laterality: N/A;  . CARDIOVERSION N/A 07/04/2019   Procedure: CARDIOVERSION;  Surgeon: Dorothy Spark, MD;  Location: [facility] ENDOSCOPY;  Service: Cardiovascular;  Laterality: N/A;  . CARDIOVERSION N/A 09/23/2019   Procedure: CARDIOVERSION;  Surgeon: Dorothy Spark, MD;  Location: [place];  Service: Cardiovascular;  Laterality: N/A;  . CATARACT EXTRACTION W/ INTRAOCULAR LENS  IMPLANT, BILATERAL    . COLONOSCOPY WITH PROPOFOL N/A 02/13/2015   Procedure: COLONOSCOPY WITH PROPOFOL;  Surgeon: Garlan Fair, MD;  Location: [facility] ENDOSCOPY;  Service: Endoscopy;  Laterality: N/A;  . CYSTOSCOPY W/ RETROGRADES Bilateral 04/12/2013   Procedure: CYSTOSCOPY WITH RETROGRADE PYELOGRAM;  Surgeon: [name], MD;  Location: [facility];  Service: Urology;  Laterality: Bilateral;  . ELECTROPHYSIOLOGIC STUDY N/A 01/26/2015   Procedure: Atrial Fibrillation Ablation;  Surgeon: [name], MD;  Location: [place] CV LAB;  Service: Cardiovascular;  Laterality: N/A;  . LEFT HEART CATHETERIZATION WITH CORONARY ANGIOGRAM N/A 11/24/2013     Procedure: LEFT HEART CATHETERIZATION WITH CORONARY ANGIOGRAM;  Surgeon: [name], MD;  Location: [facility] CATH LAB;  Service: Cardiovascular;  Laterality: N/A;  . PERCUTANEOUS CORONARY ROTOBLATOR INTERVENTION (PCI-R) N/A 11/28/2013   Procedure: PERCUTANEOUS CORONARY ROTOBLATOR INTERVENTION (PCI-R);  Surgeon: [name], MD;  Location: [facility] CATH LAB;  Service: Cardiovascular;  Laterality: N/A;  . PERCUTANEOUS CORONARY STENT INTERVENTION (PCI-S)  11/24/2013   Procedure: PERCUTANEOUS CORONARY STENT INTERVENTION (PCI-S);  Surgeon: [name], MD;  Location: [facility] CATH LAB;  Service: Cardiovascular;;  . TEE WITHOUT CARDIOVERSION  07/26/2012   Procedure: TRANSESOPHAGEAL ECHOCARDIOGRAM (TEE);  Surgeon: [name], MD;  Location: [facility] ENDOSCOPY;  Service: Cardiovascular;  Laterality: N/A;  . TEE WITHOUT CARDIOVERSION N/A 08/25/2014   Procedure: TRANSESOPHAGEAL ECHOCARDIOGRAM (TEE);  Surgeon: Larey Dresser, MD;  Location: [place];  Service: Cardiovascular;  Laterality: N/A;  . TEE WITHOUT CARDIOVERSION  01/26/2015   Procedure: Transesophageal Echocardiogram (Tee);  Surgeon: [name], MD;  Location: [place] CV LAB;  Service: Cardiovascular;;  . TEE WITHOUT CARDIOVERSION N/A 07/04/2019   Procedure: TRANSESOPHAGEAL ECHOCARDIOGRAM (TEE);  Surgeon: Dorothy Spark, MD;  Location: [facility] ENDOSCOPY;  Service: Cardiovascular;  Laterality: N/A;  . TONSILLECTOMY  age 19  . TOTAL HIP ARTHROPLASTY  07/25/2011   Procedure: TOTAL HIP ARTHROPLASTY ANTERIOR APPROACH;  Surgeon: Mcarthur Rossetti;  Location: [facility] OR[facility];  Service: Orthopedics;  Laterality: Right;  . TOTAL HIP ARTHROPLASTY Left 02-08-2010  . TRANSURETHRAL RESECTION OF BLADDER TUMOR WITH GYRUS (TURBT-GYRUS) N/A 04/12/2013   Procedure: TRANSURETHRAL RESECTION OF BLADDER TUMOR WITH GYRUS (TURBT-GYRUS);  Surgeon: [name], MD;  Location: [facility];  Service: Urology;  Laterality: N/A;    ROS- all  systems are reviewed and negatives except as per HPI above  Current Outpatient Medications  Medication Sig Dispense Refill  . Ascorbic Acid (VITAMIN C) 1000 MG tablet Take 1 tablet by mouth as needed for immunization.    . bisoprolol-hydrochlorothiazide (ZIAC) 10-6.25 MG tablet Take 1 tablet Daily for BP 90 tablet 3  . Cholecalciferol (VITAMIN D3) 125 MCG (5000 UT) CAPS Take 10,000 units Daily 30 capsule   . diltiazem (CARTIA XT) 180 MG 24 hr capsule Take 1 capsule by mouth once daily 90 capsule 2  . ezetimibe (ZETIA) 10 MG tablet Take 1 tablet Daily for Cholesterol 90 tablet 3  . metoprolol tartrate (LOPRESSOR) 25 MG tablet Take 1/2 tablet 2 x /day for BP & Heart 90 tablet 0  . Multiple Vitamin (MULTIVITAMIN WITH MINERALS) TABS tablet Take 1 tablet Daily    . nitroGLYCERIN (NITROSTAT) 0.4 MG SL tablet DISSOLVE ONE TABLET UNDER THE TONGUE EVERY 5 MINUTES AS NEEDED FOR CHEST PAIN.DO NOT EXCEED A TOTAL OF 3 DOSES IN 15 MINUTES 25 tablet 6  . Polyvinyl Alcohol-Povidone (REFRESH OP) Place 1 drop into both eyes daily as needed (dry eyes).    . rivaroxaban (XARELTO) 20 MG TABS tablet Take 1 tablet Daily to Prevent Blood Clots 90 tablet o  . rosuvastatin (CRESTOR) 40 MG tablet Take 1 tablet Daily for Cholesterol 90 tablet 3  . zinc gluconate 50 MG tablet Take 50 mg by mouth daily.     No current facility-administered medications for this visit.    Physical Exam:  Vitals:   03/16/20 0820  BP: 112/60  Pulse: (!) 55  SpO2: 93%  Weight: 188 lb 6.4 oz (85.5 kg)  Height: 5\' 9"  (1.753 m)    GEN- The patient is well appearing, alert and oriented x 3 today.   Head- normocephalic, atraumatic Eyes-  Sclera clear, conjunctiva pink Ears- hearing intact Oropharynx- clear Lungs-   normal work of breathing Heart- Regular rate and rhythm  GI- soft  Extremities- no clubbing, cyanosis, or edema  Wt Readings from Last 3 Encounters:  03/16/20 188 lb 6.4 oz (85.5 kg)  02/14/20 185 lb 3.2 oz (84 kg)    11/08/19 186 lb (84.4 kg)    EKG tracing ordered today is personally reviewed and shows sinus bradycardia  Assessment and Plan:  1. Persistent afib Doing reasonably well post ablation He did require cardioversion in December and March Continue xarelto for chads2vasc score of 3  2. OSA Compliant with CPAP  3. CAD No ischemic symptoms  4. HTN Stable No change required today   Risks, benefits and potential toxicities for medications prescribed and/or refilled reviewed with patient today.   Return to see me in 6 months  [name] MD, [facility] 03/16/2020 8:30 AM

## 2020-03-16 NOTE — Patient Instructions (Addendum)
Medication Instructions:  Your physician recommends that you continue on your current medications as directed. Please refer to the Current Medication list given to you today.  *If you need a refill on your cardiac medications before your next appointment, please call your pharmacy*  Lab Work: None ordered.  If you have labs (blood work) drawn today and your tests are completely normal, you will receive your results only by: Marland Kitchen MyChart Message (if you have MyChart) OR . A paper copy in the mail If you have any lab test that is abnormal or we need to change your treatment, we will call you to review the results.  Testing/Procedures: None ordered.  Follow-Up: At [facility], you and your health needs are our priority.  As part of our continuing mission to provide you with exceptional heart care, we have created designated Provider Care Teams.  These Care Teams include your primary Cardiologist (physician) and Advanced Practice Providers (APPs -  Physician Assistants and Nurse Practitioners) who all work together to provide you with the care you need, when you need it.  We recommend signing up for the patient portal called "MyChart".  Sign up information is provided on this After Visit Summary.  MyChart is used to connect with patients for Virtual Visits (Telemedicine).  Patients are able to view lab/test results, encounter notes, upcoming appointments, etc.  Non-urgent messages can be sent to your provider as well.   To learn more about what you can do with MyChart, go to [email or web address].    Your next appointment:   Your physician wants you to follow-up in: 09/18/19 at 9:30am here at the [address] office    Other Instructions:

## 2020-04-18 ENCOUNTER — Ambulatory Visit: Payer: Medicare Other | Admitting: Adult Health

## 2020-04-19 ENCOUNTER — Other Ambulatory Visit: Payer: Self-pay | Admitting: Internal Medicine

## 2020-04-19 MED ORDER — ROSUVASTATIN CALCIUM 40 MG PO TABS: ORAL_TABLET | ORAL | 0 refills | Status: DC

## 2020-05-16 DIAGNOSIS — I700 Atherosclerosis of aorta: Secondary | ICD-10-CM | POA: Insufficient documentation

## 2020-05-16 NOTE — Progress Notes (Deleted)
FOLLOW UP  Assessment and Plan:   Atherosclerosis of aorta Control blood pressure, cholesterol, glucose, increase exercise.   Paroxysmal atrial fibrillation (HCC) Recently s/p repeat CV and reports doing well  Continue follow up cardio Continue xarelto, diltiazem, lopressor  CAD Control blood pressure, cholesterol, glucose, increase exercise.  Followed by cardiology No recent chest pain/nitroglycerine use  PAD Denies claudication sx; *** Control blood pressure, cholesterol, glucose, increase exercise.   Hypertension Well controlled with current medications  Monitor blood pressure at home; patient to call if consistently greater than 130/80 Continue DASH diet.   Reminder to go to the ER if any CP, SOB, nausea, dizziness, severe HA, changes vision/speech, left arm numbness and tingling and jaw pain.  Cholesterol Currently at goal of LDL <70; currently on crestor 20 mg daily, working on lifestyle Continue low cholesterol diet and exercise.  Check lipid panel.   Other abnormal glucose Recent A1Cs well controlled Continue diet and exercise.  Perform daily foot/skin check, notify office of any concerning changes.  Will check A1C per strong patient preference due to recent diet adjustment   Overweight - BMI 27 Long discussion about weight loss, diet, and exercise Recommended diet heavy in fruits and veggies and low in animal meats, cheeses, and dairy products, appropriate calorie intake Discussed ideal weight for height and weight goal (180 lb) Patient will work on reducing portion sizes Will follow up in 3 months  Vitamin D Def At goal at last visit; continue supplementation to maintain goal of 60-100 Defer Vit D level   Continue diet and meds as discussed. Further disposition pending results of labs. Discussed med's effects and SE's.   Over 30 minutes of exam, counseling, chart review, and critical decision making was performed.   Future Appointments  Date Time  Provider [place]  05/17/2020 10:00 AM Liane Comber, NP [facility] None  08/23/2020  3:00 PM [name], MD [facility] None  09/17/2020  9:30 AM [name], MD [facility]  05/20/2021  9:30 AM Liane Comber, NP [facility] None    ----------------------------------------------------------------------------------------------------------------------  HPI 74 y.o. male  presents for 3 month follow up on hypertension, cholesterol, glucose management, obesity and vitamin D deficiency. In Sept 2014 , Patient had TUR of a Bladder cancer by Dr Tresa Moore and follows routinely. ***  He has a diagnosis of insomnia, was able to taper off of xanax, ***  BMI is There is no height or weight on file to calculate BMI., he has been working on diet and exercise, golfing 2-3 days a week and riding bike some, plans to increase. Has been doing oatmeal for breakfast.  Wt Readings from Last 3 Encounters:  03/16/20 188 lb 6.4 oz (85.5 kg)  02/14/20 185 lb 3.2 oz (84 kg)  11/08/19 186 lb (84.4 kg)   Patient has ASCAD  S/p PCA/stenting followed by Rotator Ablation in 2015 (Dr Aundra Dubin).   Patient also has hx/o pAfib (on Xarelto) with RFA  x 2 in 2014/2016 with multiple ablations and CVs, most recently 09/23/2019 by Dr. Meda Coffee. He continues on xarelto for CHADsVasc 3 and cardizem 180 mg and lopressor 12.5 mg BID. Also follows for PAD. He has OSA and on CPAP. Aortic atherosclerosis per CT 2015.   His blood pressure has been controlled at home, today their BP is    He does workout- [place] and golfing several times a week. He denies chest pain, shortness of breath, dizziness.   He is on cholesterol medication (currently taking crestor 20 mg  daily) and denies myalgias. His cholesterol is at goal. The cholesterol last visit was:   Lab Results  Component Value Date   CHOL 126 02/14/2020   HDL 56 02/14/2020   LDLCALC 56 02/14/2020   TRIG 62 02/14/2020   CHOLHDL 2.3 02/14/2020     He has not been working on diet and exercise for glucose management, and denies increased appetite, nausea, paresthesia of the feet, polydipsia, polyuria, visual disturbances and vomiting. Last A1C in the office was:  Lab Results  Component Value Date   HGBA1C 5.3 02/14/2020   Patient is on Vitamin D supplement and at goal at recent check:   Lab Results  Component Value Date   VD25OH 84 02/14/2020        Current Medications:  Current Outpatient Medications on File Prior to Visit  Medication Sig  . Ascorbic Acid (VITAMIN C) 1000 MG tablet Take 1 tablet by mouth as needed for immunization.  . bisoprolol-hydrochlorothiazide (ZIAC) 10-6.25 MG tablet Take 1 tablet Daily for BP  . Cholecalciferol (VITAMIN D3) 125 MCG (5000 UT) CAPS Take 10,000 units Daily  . diltiazem (CARTIA XT) 180 MG 24 hr capsule Take 1 capsule by mouth once daily  . ezetimibe (ZETIA) 10 MG tablet Take 1 tablet Daily for Cholesterol  . metoprolol tartrate (LOPRESSOR) 25 MG tablet Take 1/2 tablet 2 x /day for BP & Heart  . Multiple Vitamin (MULTIVITAMIN WITH MINERALS) TABS tablet Take 1 tablet Daily  . nitroGLYCERIN (NITROSTAT) 0.4 MG SL tablet DISSOLVE ONE TABLET UNDER THE TONGUE EVERY 5 MINUTES AS NEEDED FOR CHEST PAIN.DO NOT EXCEED A TOTAL OF 3 DOSES IN 15 MINUTES  . Polyvinyl Alcohol-Povidone (REFRESH OP) Place 1 drop into both eyes daily as needed (dry eyes).  . rivaroxaban (XARELTO) 20 MG TABS tablet Take 1 tablet Daily to Prevent Blood Clots  . rosuvastatin (CRESTOR) 40 MG tablet Take     1 tablet     Daily     for Cholesterol  . zinc gluconate 50 MG tablet Take 50 mg by mouth daily.   No current facility-administered medications on file prior to visit.     Allergies:  No Known Allergies   Medical History:  Past Medical History:  Diagnosis Date  . Bladder cancer ([place])   . Hearing loss of both ears   . History of colon polyps    BENIGN  . Hyperlipidemia   . Hypertension   . Mitral regurgitation   .  OSA on CPAP    cpap settign of 13  . PAD (peripheral artery disease) (HCC)    ABI--  RIGHT SIDE NORMAL LEFT SIDE MODERATELY REDUCED W/ DISTAL LEFT SFA STENOSIS//  MILD CALDICATION  . PAF (paroxysmal atrial fibrillation) ([place])     a. s/p PVI 2014; b. s/p redo PVI and CTI 01/2015  . Pre-diabetes   . Vitamin D deficiency    Family history- Reviewed and unchanged Social history- Reviewed and unchanged   Review of Systems: *** Review of Systems  Constitutional: Negative for malaise/fatigue and weight loss.  HENT: Negative for hearing loss and tinnitus.   Eyes: Negative for blurred vision and double vision.  Respiratory: Negative for cough, shortness of breath and wheezing.   Cardiovascular: Negative for chest pain, palpitations, orthopnea, claudication and leg swelling.  Gastrointestinal: Negative for abdominal pain, blood in stool, constipation, diarrhea, heartburn, melena, nausea and vomiting.  Genitourinary: Negative.   Musculoskeletal: Positive for back pain (chronic intermittent lumbar aching, has seen ortho). Negative for joint pain  and myalgias.  Skin: Negative for rash.  Neurological: Negative for dizziness, tingling, sensory change, weakness and headaches.  Endo/Heme/Allergies: Negative for polydipsia.  Psychiatric/Behavioral: Negative.   All other systems reviewed and are negative.    Physical Exam: There were no vitals taken for this visit. Wt Readings from Last 3 Encounters:  03/16/20 188 lb 6.4 oz (85.5 kg)  02/14/20 185 lb 3.2 oz (84 kg)  11/08/19 186 lb (84.4 kg)   General Appearance: Well nourished, in no apparent distress. Eyes: PERRLA, EOMs, conjunctiva no swelling or erythema Sinuses: No Frontal/maxillary tenderness ENT/Mouth: Ext aud canals clear, TMs without erythema, bulging. No erythema, swelling, or exudate on post pharynx.  Tonsils not swollen or erythematous. Hearing normal.  Neck: Supple, thyroid normal.  Respiratory: Respiratory effort normal, BS equal  bilaterally without rales, rhonchi, wheezing or stridor.  Cardio: RRR with no MRGs. Brisk peripheral pulses without edema.  Abdomen: Soft, + BS.  Non tender, no guarding, rebound, hernias, masses. Lymphatics: Non tender without lymphadenopathy.  Musculoskeletal: Full ROM, 5/5 strength, Normal gait Skin: Warm, dry without rashes, lesions, ecchymosis.  Neuro: Cranial nerves intact. No cerebellar symptoms.  Psych: Awake and oriented X 3, normal affect, Insight and Judgment appropriate.    Izora Ribas, NP 1:15 PM [facility]

## 2020-05-16 NOTE — Progress Notes (Signed)
Patient ID: Damon [name], male   DOB: 1946/04/11, 74 y.o.   MRN: [id number]  MEDICARE ANNUAL WELLNESS VISIT AND OV  Assessment:    Encounter for Medicare annual wellness exam  Atherosclerosis of aorta Per CT 2015 Control blood pressure, cholesterol, glucose, increase exercise.   Essential hypertension - continue medications, DASH diet, exercise and monitor at home. Call if greater than 130/80.  - CBC with Differential/Platelet - CMP/GFR - TSH   Paroxysmal atrial fibrillation (HCC) Rate controlled; successful ablation 08/25/2018 Continue follow up cardio Continue xarelto  CAD Control blood pressure, cholesterol, glucose, increase exercise.  Followed by cardiology No recent chest pain/nitroglycerine use  PAD Denies claudication; cardiology following Control blood pressure, cholesterol, glucose, increase exercise.    Hyperlipidemia -continue medications, check lipids, decrease fatty foods, increase activity.  - Lipid panel  Other abnormal glucose Recent A1Cs at goal Discussed diet/exercise, weight management  Defer A1C; check CMP  Vitamin D deficiency At goal at recent check; continue to recommend supplementation for goal of 70-100 Defer vitamin D level   Malignant neoplasm of urinary bladder, unspecified site [facility]) Continue close monitoring by urology  OSA on CPAP Sleep apnea- continue CPAP, weight loss advised.    Medication management - Magnesium   Mitral regurgitation Continue cardio follow up   Peripheral arterial disease (HCC) Control blood pressure, cholesterol, glucose, increase exercise.   Gastroesophageal reflux disease, esophagitis presence not specified Continue PPI/H2 blocker, diet discussed  BMI 28 Long discussion about weight loss, diet, and exercise Recommended diet heavy in fruits and veggies and low in animal meats, cheeses, and dairy products, appropriate calorie intake Discussed appropriate weight for height Increase activity level   Follow up at next visit  Insomnia Doing well off of xanax - good sleep hygiene discussed, increase day time activity, try melatonin or benadryl if needed  History of colon polyps Colonoscopy UTD; due 2023; increase fiber  Bilateral hearing loss High frequency only, doesn't want hearing aids at this time  Upper respiratory infection with cough and congestion Fully vaccinated for covid 19, drive through rapid was was done after appointment- patient called back to report negative  Preceded by typical allergy sx; advised start on antihistamine  No cough for duration of visit, benign exam;  Possible viral infection superimposed on allergies  Discussed the importance of avoiding unnecessary antibiotic therapy. Suggested symptomatic OTC remedies. Follow up as needed in not resolving as expected in 5-7 days, if progressive sx consider CXR, abx -     predniSONE (DELTASONE) 20 MG tablet; 2 tablets daily for 3 days, 1 tablet daily for 4 days.   Future Appointments  Date Time Provider [place]  08/23/2020  3:00 PM [name], MD [facility] None  09/17/2020  9:30 AM [name], MD [facility]  05/20/2021  9:30 AM Liane Comber, NP [facility] None     Plan:   During the course of the visit the patient was educated and counseled about appropriate screening and preventive services including:    Pneumococcal vaccine   Influenza vaccine  Td vaccine  Screening electrocardiogram  Bone densitometry screening  Colorectal cancer screening  Diabetes screening  Glaucoma screening  Nutrition counseling   Advanced directives: requested  Subjective:   Damon [name]  presents for Medicare Annual Wellness Visit and 3 month follow up for Hypertension, Hyperlipidemia, Pre-Diabetes and Vitamin D Deficiency.   He reports has had allergies flaring, nasal congestion, watery itchy eyes for several weeks, hasn't been taking anything for this, worries about  med triggering a. Fib, then in the last 2 days has progressed with increased congestion, drainage, chest congestion, cough, productive of small amount of yellow/white plegm. Did start with a low grade temp Tuesday, up to 100.2 yesterday. Denies HA, sore throat, dyspnea. He has covid 19 drive through test after our appointment today. No known exposures, has had full covid 19 vaccine. Hasn't taken anything other than cough drops.   Patient was taking xanax for insomnia but was able to taper off, reports is sleeping better since getting off of the medication.   In Sept 2014 , Patient had TUR of a Bladder cancer by Dr Tresa Moore, follows regularly.  He has OSA and is on a CPAP  BMI is Body mass index is 28.5 kg/m., he has been working on diet and exercise; typically golfs once a week, was riding bike regularly, hasn't recently but receptive to restarting. Also admits has been eating more since feeling unwell.  Wt Readings from Last 3 Encounters:  05/17/20 193 lb (87.5 kg)  03/16/20 188 lb 6.4 oz (85.5 kg)  02/14/20 185 lb 3.2 oz (84 kg)   Patient has HTN. Today's BP: 108/60.   Patient has ASCAD  S/p PCA/stenting followed by Rotator Ablation in 2015 (Dr Aundra Dubin).   Patient also has hx/o pAfib (on Xarelto) with RFA  x 2 in 2014/2016. He has had several episodes in 2019 with successful conversion; patient went successful ablation in Feb 2020, coversion in March 2021 (a. Fib after second shot for covid 19) and has remained in sinus rhythm. He has PAD, denies recent symptoms, improved with increased exercise.  Aortic atherosclerosis per CT 2015.  Patient has had no complaints of any cardiac type chest pain, palpitations, dyspnea/orthopnea/PND, dizziness, claudication, or dependent edema.  Hyperlipidemia is controlled with diet & meds, he is on crestor, taking 20 mg daily. Patient denies myalgias or other med SE's. Last Lipids were at goal -   Lab Results  Component Value Date   CHOL 126 02/14/2020   HDL 56  02/14/2020   LDLCALC 56 02/14/2020   TRIG 62 02/14/2020   CHOLHDL 2.3 02/14/2020   Also, the patient has history of PreDiabetes with A1c 5.7% in 2011 and 5.9% in 2013, and then 5.6% x 2 in late 2013 now controlled by diet.  He's had no symptoms of reactive hypoglycemia, diabetic polys, paresthesias or visual blurring.   Lab Results  Component Value Date   HGBA1C 5.3 02/14/2020    Last GFR:  Lab Results  Component Value Date   GFRNONAA 85 02/14/2020   Further, the patient also has history of Vitamin D Deficiency of 27 in 2008 and supplements vitamin D without any suspected side-effects. Lab Results  Component Value Date   VD25OH 84 02/14/2020      Medication Review: Current Outpatient Medications on File Prior to Visit  Medication Sig  . bisoprolol-hydrochlorothiazide (ZIAC) 10-6.25 MG tablet Take 1 tablet Daily for BP  . Cholecalciferol (VITAMIN D3) 125 MCG (5000 UT) CAPS Take 10,000 units Daily  . diltiazem (CARTIA XT) 180 MG 24 hr capsule Take 1 capsule by mouth once daily  . ezetimibe (ZETIA) 10 MG tablet Take 1 tablet Daily for Cholesterol  . metoprolol tartrate (LOPRESSOR) 25 MG tablet Take 1/2 tablet 2 x /day for BP & Heart  . Multiple Vitamin (MULTIVITAMIN WITH MINERALS) TABS tablet Take 1 tablet Daily  . nitroGLYCERIN (NITROSTAT) 0.4 MG SL tablet DISSOLVE ONE TABLET UNDER THE TONGUE EVERY 5 MINUTES AS NEEDED FOR CHEST  PAIN.DO NOT EXCEED A TOTAL OF 3 DOSES IN 15 MINUTES  . Polyvinyl Alcohol-Povidone (REFRESH OP) Place 1 drop into both eyes daily as needed (dry eyes).  . rivaroxaban (XARELTO) 20 MG TABS tablet Take 1 tablet Daily to Prevent Blood Clots  . rosuvastatin (CRESTOR) 40 MG tablet Take     1 tablet     Daily     for Cholesterol  . zinc gluconate 50 MG tablet Take 50 mg by mouth daily.  . Ascorbic Acid (VITAMIN C) 1000 MG tablet Take 1 tablet by mouth as needed for immunization. (Patient not taking: Reported on 05/17/2020)   No current facility-administered  medications on file prior to visit.    Current Problems (verified) Patient Active Problem List   Diagnosis Date Noted  . Aortic atherosclerosis ([place]) 05/16/2020  . Paroxysmal atrial fibrillation (HCC)   . Insomnia 03/23/2018  . Overweight (BMI 25.0-29.9) 08/17/2017  . PAD (peripheral artery disease) ([place])   . OSA on CPAP   . History of colon polyps   . Hearing loss of both ears   . Bladder cancer ([place]) 02/16/2015  . Gastroesophageal reflux disease 02/16/2015  . CAD- CFX PTCA 11/24/13 with plans for DES 11/28/13 11/25/2013  . Hyperlipidemia, mixed 06/22/2013  . Abnormal glucose 06/22/2013  . Vitamin D deficiency 06/22/2013  . Essential hypertension   . Mitral regurgitation     Screening Tests Immunization History  Administered Date(s) Administered  . DT (Pediatric) 07/22/2003, 12/28/2013  . Influenza Split 04/14/2019  . Influenza Whole 05/19/2013  . Influenza, High Dose Seasonal PF 04/08/2017  . Influenza,inj,Quad PF,6+ Mos 05/09/2014  . Influenza-Unspecified 05/14/2015, 05/01/2016, 05/17/2018  . PFIZER SARS-COV-2 Vaccination 08/18/2019, 09/15/2019  . Pneumococcal Conjugate-13 07/11/2014  . Pneumococcal Polysaccharide-23 12/04/2009, 06/10/2016  . Tdap 09/22/2013   TDAP 2015 Influenza 2020, DUE, declines today due to feeling unwell, will get at pharmacy  Prevnar 13 2015 Pneumonia 2011, 2017 Shingrix: declines  Covid 19: 2/2, 2021, pfizer, declining booster at this time, ? 2nd shot triggered a. Fib.   CT chest 10/2014  Preventative care: Last colonoscopy: 06/2019, Dr. Inda Coke, polyp, 3 year follow up   Names of Other Physician/Practitioners you currently use: 1. [facility] here for primary care 2. Dr Sherley Bounds, eye doctor, last visit 2021 3. ? Name of his dentist, dentist, last visit 2014, several years  Patient Care Team: [name], MD as PCP - General (Internal Medicine) [name], MD as PCP -  Cardiology (Cardiology) [name], MD as PCP - Electrophysiology (Cardiology) Dwan Bolt, MD as Referring Physician (Cardiology) [name], MD as Consulting Physician (Cardiology) [name], MD as Consulting Physician (Urology) Mcarthur Rossetti, MD as Consulting Physician (Orthopedic Surgery) Ponciano Ort, MD as Referring Physician (Urology) Garlan Fair, MD as Consulting Physician (Gastroenterology)   Allergies No Known Allergies  SURGICAL HISTORY He  has a past surgical history that includes Total hip arthroplasty (07/25/2011); TEE without cardioversion (07/26/2012); Cardioversion (08/06/2012); Total hip arthroplasty (Left, 02-08-2010); Cataract extraction w/ intraocular lens  implant, bilateral; Cardiac electrophysiology study and ablation (07-27-2012  DR ALLRED); Tonsillectomy (age 38); Cystoscopy w/ retrogrades (Bilateral, 04/12/2013); Transurethral resection of bladder tumor with gyrus (turbt-gyrus) (N/A, 04/12/2013); atrial fibrillation ablation (N/A, 07/27/2012); left heart catheterization with coronary angiogram (N/A, 11/24/2013); percutaneous coronary stent intervention (pci-s) (11/24/2013); percutaneous coronary rotoblator intervention (pci-r) (N/A, 11/28/2013); TEE without cardioversion (N/A, 08/25/2014); Cardioversion (N/A, 08/25/2014); Cardioversion (N/A, 11/13/2014); Cardioversion (N/A, 12/28/2014); Cardiac catheterization (N/A, 01/26/2015); TEE without cardioversion (01/26/2015);  Ablation of dysrhythmic focus (01/26/2015); Colonoscopy with propofol (N/A, 02/13/2015); Cardioversion (N/A, 01/30/2017); Cardioversion (N/A, 04/06/2017); Cardioversion (N/A, 05/25/2018); Cardioversion (N/A, 08/09/2018); ATRIAL FIBRILLATION ABLATION (N/A, 08/25/2018); TEE without cardioversion (N/A, 07/04/2019); Cardioversion (N/A, 07/04/2019); and Cardioversion (N/A, 09/23/2019). FAMILY HISTORY His family history includes Heart attack in his mother; Heart disease in his father and mother; Heart  failure in his father. SOCIAL HISTORY He  reports that he quit smoking about 38 years ago. His smoking use included cigarettes. He has a 20.00 pack-year smoking history. He has never used smokeless tobacco. He reports that he does not drink alcohol and does not use drugs.  MEDICARE WELLNESS OBJECTIVES: Physical activity: Current Exercise Habits: The patient does not participate in regular exercise at present, Exercise limited by: orthopedic condition(s) Cardiac risk factors: Cardiac Risk Factors include: advanced age (>61men, >38 women);dyslipidemia;hypertension;male gender;smoking/ tobacco exposure Depression/mood screen:   Depression screen [facility] 2/9 05/17/2020  Decreased Interest 0  Down, Depressed, Hopeless 0  PHQ - 2 Score 0  Some recent data might be hidden    ADLs:  In your present state of health, do you have any difficulty performing the following activities: 05/17/2020 02/13/2020  Hearing? Y N  Comment high pitches only, needs hearing aids, has been declining -  Vision? N N  Difficulty concentrating or making decisions? N N  Walking or climbing stairs? N N  Dressing or bathing? N N  Doing errands, shopping? N N  Some recent data might be hidden     Cognitive Testing  Alert? Yes  Normal Appearance?Yes  Oriented to person? Yes  Place? Yes   Time? Yes  Recall of three objects?  Yes  Can perform simple calculations? Yes  Displays appropriate judgment?Yes  Can read the correct time from a watch face?Yes  EOL planning: Does Patient Have a Medical Advance Directive?: No Would patient like information on creating a medical advance directive?: No - Patient declined    Objective:     Blood pressure 108/60, pulse 67, temperature 98.6 F (37 C), weight 193 lb (87.5 kg), SpO2 96 %.   General Appearance:  Alert  WD/WN, male  in no apparent distress. Eyes: PERRLA, EOMs nl, conjunctiva normal Sinuses: No frontal/maxillary tenderness ENT/Mouth: EACs patent / TMs  nl. Nares  clear without erythema, swelling, mucoid exudates. Oral hygiene is good. No erythema, swelling, or exudate. Tongue normal, non-obstructing. Tonsils not swollen or erythematous. Not notably HOH today with masks on.  Neck: Supple, thyroid normal. No bruits, nodes or JVD. Respiratory: Respiratory effort normal.  BS equal and clear bilateral without rales, rhonci, wheezing or stridor. Cardio: Heart sounds are normal with regular rate and rhythm and no murmurs, rubs or gallops. Peripheral pulses are normal and equal bilaterally without edema. No aortic or femoral bruits. Chest: symmetric with normal excursions and percussion.  Abdomen: Flat, soft, with nl bowel sounds. No guarding, rebound, hernias, masses, or organomegaly. Mild suprapubic tenderness Lymphatics: Non tender without lymphadenopathy.  Musculoskeletal: Full ROM all peripheral extremities, joint stability, 5/5 strength, and normal gait. Patient is able to ambulate well. Gait is not  Antalgic.  Neuro: Cranial nerves intact, reflexes equal bilaterally. Normal muscle tone, no cerebellar symptoms. Sensation intact.  Pysch: Alert and oriented X 3 with normal affect, insight and judgment appropriate.    Medicare Attestation I have personally reviewed: The patient's medical and social history Their use of alcohol, tobacco or illicit drugs Their current medications and supplements The patient's functional ability including ADLs,fall risks, home safety risks, cognitive, and hearing  and visual impairment Diet and physical activities Evidence for depression or mood disorders  The patient's weight, height, BMI, and visual acuity have been recorded in the chart.  I have made referrals, counseling, and provided education to the patient based on review of the above and I have provided the patient with a written personalized care plan for preventive services.  Over 40 minutes of exam, counseling, chart review was performed.  Izora Ribas,  NP   05/17/2020

## 2020-05-17 ENCOUNTER — Ambulatory Visit: Payer: Medicare Other | Admitting: Adult Health

## 2020-05-17 ENCOUNTER — Other Ambulatory Visit: Payer: Self-pay

## 2020-05-17 ENCOUNTER — Encounter: Payer: Self-pay | Admitting: Adult Health

## 2020-05-17 VITALS — BP 108/60 | HR 67 | Temp 98.6°F | Wt 193.0 lb

## 2020-05-17 DIAGNOSIS — I480 Paroxysmal atrial fibrillation: Secondary | ICD-10-CM

## 2020-05-17 DIAGNOSIS — Z86010 Personal history of colonic polyps: Secondary | ICD-10-CM

## 2020-05-17 DIAGNOSIS — J069 Acute upper respiratory infection, unspecified: Secondary | ICD-10-CM

## 2020-05-17 DIAGNOSIS — R6889 Other general symptoms and signs: Secondary | ICD-10-CM

## 2020-05-17 DIAGNOSIS — R7309 Other abnormal glucose: Secondary | ICD-10-CM

## 2020-05-17 DIAGNOSIS — I2510 Atherosclerotic heart disease of native coronary artery without angina pectoris: Secondary | ICD-10-CM

## 2020-05-17 DIAGNOSIS — G4700 Insomnia, unspecified: Secondary | ICD-10-CM

## 2020-05-17 DIAGNOSIS — I10 Essential (primary) hypertension: Secondary | ICD-10-CM | POA: Diagnosis not present

## 2020-05-17 DIAGNOSIS — I739 Peripheral vascular disease, unspecified: Secondary | ICD-10-CM

## 2020-05-17 DIAGNOSIS — I700 Atherosclerosis of aorta: Secondary | ICD-10-CM

## 2020-05-17 DIAGNOSIS — I340 Nonrheumatic mitral (valve) insufficiency: Secondary | ICD-10-CM

## 2020-05-17 DIAGNOSIS — E663 Overweight: Secondary | ICD-10-CM

## 2020-05-17 DIAGNOSIS — C679 Malignant neoplasm of bladder, unspecified: Secondary | ICD-10-CM

## 2020-05-17 DIAGNOSIS — Z0001 Encounter for general adult medical examination with abnormal findings: Secondary | ICD-10-CM | POA: Diagnosis not present

## 2020-05-17 DIAGNOSIS — H9193 Unspecified hearing loss, bilateral: Secondary | ICD-10-CM

## 2020-05-17 DIAGNOSIS — E782 Mixed hyperlipidemia: Secondary | ICD-10-CM

## 2020-05-17 DIAGNOSIS — G4733 Obstructive sleep apnea (adult) (pediatric): Secondary | ICD-10-CM

## 2020-05-17 DIAGNOSIS — Z0000 Encounter for general adult medical examination without abnormal findings: Secondary | ICD-10-CM

## 2020-05-17 DIAGNOSIS — E559 Vitamin D deficiency, unspecified: Secondary | ICD-10-CM

## 2020-05-17 DIAGNOSIS — Z9989 Dependence on other enabling machines and devices: Secondary | ICD-10-CM

## 2020-05-17 DIAGNOSIS — K219 Gastro-esophageal reflux disease without esophagitis: Secondary | ICD-10-CM

## 2020-05-17 MED ORDER — PREDNISONE 20 MG PO TABS: ORAL_TABLET | ORAL | 0 refills | Status: DC

## 2020-05-17 NOTE — Patient Instructions (Addendum)
Mr. Liberati , Thank you for taking time to come for your Medicare Wellness Visit. I appreciate your ongoing commitment to your health goals. Please review the following plan we discussed and let me know if I can assist you in the future.   These are the goals we discussed: Goals    . Blood Pressure < 130/80    . LDL CALC < 70    . Weight (lb) < 180 lb (81.6 kg)       This is a list of the screening recommended for you and due dates:  Health Maintenance  Topic Date Due  . Flu Shot  02/19/2020  . Colon Cancer Screening  06/23/2022  . Tetanus Vaccine  09/23/2023  . COVID-19 Vaccine  Completed  .  Hepatitis C: One time screening is recommended by Center for Disease Control  (CDC) for  adults born from [birth year] through 1966.   Completed  . Pneumonia vaccines  Completed    Recommend starting on daily antihistamine until allergy symptoms resolve  Fexofenadine - allegra Claritin Zyrtec   Just avoid the combo meds that had -D on the end, plain antihistamine should be safe for heart    Can do delsym/robitussin if cough gets worse over the weekend     HOW TO TREAT VIRAL COUGH AND COLD SYMPTOMS:  -Symptoms usually last at least 1 week with the worst symptoms being around day 4.  - colds usually start with a sore throat and end with a cough, and the cough can take 2 weeks to get better.  -No antibiotics are needed for colds, flu, sore throats, cough, bronchitis UNLESS symptoms are longer than 7 days OR if you are getting better then get drastically worse.  -There are a lot of combination medications (Dayquil, Nyquil, Vicks 44, tyelnol cold and sinus, ETC). Please look at the ingredients on the back so that you are treating the correct symptoms and not doubling up on medications/ingredients.    Medicines you can use  Nasal congestion  Little Remedies saline spray (aerosol/mist)- can try this, it is in the kids section - pseudoephedrine (Sudafed)- behind the counter, do not use if you have  high blood pressure, medicine that have -D in them.  - phenylephrine (Sudafed PE) -Dextormethorphan + chlorpheniramine (Coridcidin HBP)- okay if you have high blood pressure -Oxymetazoline (Afrin) nasal spray- LIMIT to 3 days -Saline nasal spray -Neti pot (used distilled or bottled water)  Ear pain/congestion  -pseudoephedrine (sudafed) - Nasonex/flonase nasal spray  Fever  -Acetaminophen (Tyelnol) -Ibuprofen (Advil, motrin, aleve)  Sore Throat  -Acetaminophen (Tyelnol) -Ibuprofen (Advil, motrin, aleve) -Drink a lot of water -Gargle with salt water - Rest your voice (don't talk) -Throat sprays -Cough drops  Body Aches  -Acetaminophen (Tyelnol) -Ibuprofen (Advil, motrin, aleve)  Headache  -Acetaminophen (Tyelnol) -Ibuprofen (Advil, motrin, aleve) - Exedrin, Exedrin Migraine  Allergy symptoms (cough, sneeze, runny nose, itchy eyes) -Claritin or loratadine cheapest but likely the weakest  -Zyrtec or certizine at night because it can make you sleepy -The strongest is allegra or fexafinadine  Cheapest at [employer], [employer], [employer]  Cough  -Dextromethorphan (Delsym)- medicine that has DM in it -Guafenesin (Mucinex/Robitussin) - cough drops - drink lots of water  Chest Congestion  -Guafenesin (Mucinex/Robitussin)  Red Itchy Eyes  - Naphcon-A  Upset Stomach  - Bland diet (nothing spicy, greasy, fried, and high acid foods like tomatoes, oranges, berries) -OKAY- cereal, bread, soup, crackers, rice -Eat smaller more frequent meals -reduce caffeine, no alcohol -Loperamide (Imodium-AD) if  diarrhea -Prevacid for heart burn  General health when sick  -Hydration -wash your hands frequently -keep surfaces clean -change pillow cases and sheets often -Get fresh air but do not exercise strenuously -Vitamin D, double up on it - Vitamin C -Zinc

## 2020-05-18 LAB — CBC WITH DIFFERENTIAL/PLATELET
Absolute Monocytes: 995 cells/uL — ABNORMAL HIGH (ref 200–950)
Basophils Absolute: 47 cells/uL (ref 0–200)
Basophils Relative: 0.5 %
Eosinophils Absolute: 223 cells/uL (ref 15–500)
Eosinophils Relative: 2.4 %
HCT: 46.5 % (ref 38.5–50.0)
Hemoglobin: 15.9 g/dL (ref 13.2–17.1)
Lymphs Abs: 1525 cells/uL (ref 850–3900)
MCH: 30.9 pg (ref 27.0–33.0)
MCHC: 34.2 g/dL (ref 32.0–36.0)
MCV: 90.3 fL (ref 80.0–100.0)
MPV: 9.9 fL (ref 7.5–12.5)
Monocytes Relative: 10.7 %
Neutro Abs: 6510 cells/uL (ref 1500–7800)
Neutrophils Relative %: 70 %
Platelets: 198 10*3/uL (ref 140–400)
RBC: 5.15 10*6/uL (ref 4.20–5.80)
RDW: 11.8 % (ref 11.0–15.0)
Total Lymphocyte: 16.4 %
WBC: 9.3 10*3/uL (ref 3.8–10.8)

## 2020-05-18 LAB — COMPLETE METABOLIC PANEL WITH GFR
AG Ratio: 1.8 (calc) (ref 1.0–2.5)
ALT: 29 U/L (ref 9–46)
AST: 24 U/L (ref 10–35)
Albumin: 4.2 g/dL (ref 3.6–5.1)
Alkaline phosphatase (APISO): 63 U/L (ref 35–144)
BUN: 11 mg/dL (ref 7–25)
CO2: 30 mmol/L (ref 20–32)
Calcium: 9.8 mg/dL (ref 8.6–10.3)
Chloride: 109 mmol/L (ref 98–110)
Creat: 0.89 mg/dL (ref 0.70–1.18)
GFR, Est African American: 97 mL/min/{1.73_m2} (ref >=60)
GFR, Est Non African American: 84 mL/min/{1.73_m2} (ref >=60)
Globulin: 2.3 g/dL (calc) (ref 1.9–3.7)
Glucose, Bld: 100 mg/dL — ABNORMAL HIGH (ref 65–99)
Potassium: 4.3 mmol/L (ref 3.5–5.3)
Sodium: 141 mmol/L (ref 135–146)
Total Bilirubin: 1.7 mg/dL — ABNORMAL HIGH (ref 0.2–1.2)
Total Protein: 6.5 g/dL (ref 6.1–8.1)

## 2020-05-18 LAB — LIPID PANEL
Cholesterol: 122 mg/dL (ref <200)
HDL: 62 mg/dL (ref >=40)
LDL Cholesterol (Calc): 47 mg/dL (calc)
Non-HDL Cholesterol (Calc): 60 mg/dL (calc) (ref <130)
Total CHOL/HDL Ratio: 2 (calc) (ref <5.0)
Triglycerides: 57 mg/dL (ref <150)

## 2020-05-18 LAB — MAGNESIUM: Magnesium: 2.2 mg/dL (ref 1.5–2.5)

## 2020-05-18 LAB — TSH: TSH: 0.76 mIU/L (ref 0.40–4.50)

## 2020-07-01 ENCOUNTER — Other Ambulatory Visit: Payer: Self-pay

## 2020-07-01 ENCOUNTER — Ambulatory Visit (INDEPENDENT_AMBULATORY_CARE_PROVIDER_SITE_OTHER): Payer: Medicare Other

## 2020-07-01 ENCOUNTER — Ambulatory Visit
Admission: EM | Admit: 2020-07-01 | Discharge: 2020-07-01 | Disposition: A | Discharge status: Home / Self Care | Payer: Medicare Other | Attending: Emergency Medicine | Admitting: Emergency Medicine

## 2020-07-01 DIAGNOSIS — S80812A Abrasion, left lower leg, initial encounter: Secondary | ICD-10-CM | POA: Diagnosis not present

## 2020-07-01 DIAGNOSIS — M79662 Pain in left lower leg: Secondary | ICD-10-CM

## 2020-07-01 DIAGNOSIS — M7989 Other specified soft tissue disorders: Secondary | ICD-10-CM

## 2020-07-01 DIAGNOSIS — R2242 Localized swelling, mass and lump, left lower limb: Secondary | ICD-10-CM | POA: Diagnosis not present

## 2020-07-01 MED ORDER — MUPIROCIN 2 % EX OINT: 1.0000 "application " | TOPICAL_OINTMENT | Freq: Two times a day (BID) | CUTANEOUS | 0 refills | Status: DC

## 2020-07-01 NOTE — Discharge Instructions (Signed)
X-ray without signs of fracture Use Ace wrap for compression to help with swelling Ice and elevate May use antibiotic ointment around wound 1-2 times daily to help prevent infection, keep clean and dry Tylenol for pain/swelling  Return if not improving or worsening, becoming pale cold, numbness tingling

## 2020-07-01 NOTE — ED Triage Notes (Signed)
Pt was on a riding lawn mower when the mower caught some shrubs catching his left leg between the branches. He has complaints of swelling and bruising on his lower left leg down to his ankle. Associated symptoms are throbbing pain when first walking and scabbing on his shin.

## 2020-07-01 NOTE — ED Provider Notes (Signed)
[facility]    CSN: [id number] Arrival date & time: 07/01/20  1000      History   Chief Complaint Chief Complaint  Patient presents with  . Leg Pain  . Leg Injury    HPI Damon [name] is a 74 y.o. male history of bladder cancer, A. fib, hypertension, CAD, presenting today for evaluation of left leg injury.  Reports that he was on a riding [place] got caught in some shrubs, sustained abrasion with scabbing to anterior leg.  Incident occurred 2 to 3 days ago.  Since has had increased bruising swelling and pain with ambulating.  Patient is on Xarelto.  HPI  Past Medical History:  Diagnosis Date  . Bladder cancer ([place])   . Hearing loss of both ears   . History of colon polyps    BENIGN  . Hyperlipidemia   . Hypertension   . Mitral regurgitation   . OSA on CPAP    cpap settign of 13  . PAD (peripheral artery disease) (HCC)    ABI--  RIGHT SIDE NORMAL LEFT SIDE MODERATELY REDUCED W/ DISTAL LEFT SFA STENOSIS//  MILD CALDICATION  . PAF (paroxysmal atrial fibrillation) ([place])     a. s/p PVI 2014; b. s/p redo PVI and CTI 01/2015  . Pre-diabetes   . Vitamin D deficiency     Patient Active Problem List   Diagnosis Date Noted  . Aortic atherosclerosis ([place]) 05/16/2020  . Paroxysmal atrial fibrillation (HCC)   . Insomnia 03/23/2018  . Overweight (BMI 25.0-29.9) 08/17/2017  . PAD (peripheral artery disease) ([place])   . OSA on CPAP   . History of colon polyps   . Hearing loss of both ears   . Bladder cancer ([place]) 02/16/2015  . Gastroesophageal reflux disease 02/16/2015  . CAD- CFX PTCA 11/24/13 with plans for DES 11/28/13 11/25/2013  . Hyperlipidemia, mixed 06/22/2013  . Abnormal glucose 06/22/2013  . Vitamin D deficiency 06/22/2013  . Essential hypertension   . Mitral regurgitation     Past Surgical History:  Procedure Laterality Date  . ABLATION OF DYSRHYTHMIC FOCUS  01/26/2015  . ATRIAL FIBRILLATION ABLATION N/A 07/27/2012   Procedure:  ATRIAL FIBRILLATION ABLATION;  Surgeon: [name], MD;  Location: [facility] CATH LAB;  Service: Cardiovascular;  Laterality: N/A;  . ATRIAL FIBRILLATION ABLATION N/A 08/25/2018   Procedure: ATRIAL FIBRILLATION ABLATION;  Surgeon: [name], MD;  Location: [place] CV LAB;  Service: Cardiovascular;  Laterality: N/A;  . CARDIAC ELECTROPHYSIOLOGY STUDY AND ABLATION  07-27-2012  DR ALLRED   SUCCESSFUL ABLATION OF A-FIB  . CARDIOVERSION  08/06/2012   Procedure: CARDIOVERSION;  Surgeon: [name], MD;  Location: [facility] ENDOSCOPY;  Service: Cardiovascular;  Laterality: N/A;  . CARDIOVERSION N/A 08/25/2014   Procedure: CARDIOVERSION;  Surgeon: Larey Dresser, MD;  Location: [place];  Service: Cardiovascular;  Laterality: N/A;  . CARDIOVERSION N/A 11/13/2014   Procedure: CARDIOVERSION;  Surgeon: Sueanne Margarita, MD;  Location: [facility] ENDOSCOPY;  Service: Cardiovascular;  Laterality: N/A;  . CARDIOVERSION N/A 12/28/2014   Procedure: CARDIOVERSION;  Surgeon: [name], MD;  Location: [facility] ENDOSCOPY;  Service: Cardiovascular;  Laterality: N/A;  . CARDIOVERSION N/A 01/30/2017   Procedure: CARDIOVERSION;  Surgeon: Josue Hector, MD;  Location: [facility] ENDOSCOPY;  Service: Cardiovascular;  Laterality: N/A;  . CARDIOVERSION N/A 04/06/2017   Procedure: CARDIOVERSION;  Surgeon: Dorothy Spark, MD;  Location: [facility] ENDOSCOPY;  Service: Cardiovascular;  Laterality: N/A;  . CARDIOVERSION N/A 05/25/2018   Procedure: CARDIOVERSION;  Surgeon: Skeet Latch, MD;  Location: [place];  Service: Cardiovascular;  Laterality: N/A;  . CARDIOVERSION N/A 08/09/2018   Procedure: CARDIOVERSION;  Surgeon: Buford Dresser, MD;  Location: [facility] ENDOSCOPY;  Service: Cardiovascular;  Laterality: N/A;  . CARDIOVERSION N/A 07/04/2019   Procedure: CARDIOVERSION;  Surgeon: Dorothy Spark, MD;  Location: [facility] ENDOSCOPY;  Service: Cardiovascular;  Laterality: N/A;  . CARDIOVERSION N/A 09/23/2019   Procedure: CARDIOVERSION;  Surgeon:  Dorothy Spark, MD;  Location: [place];  Service: Cardiovascular;  Laterality: N/A;  . CATARACT EXTRACTION W/ INTRAOCULAR LENS  IMPLANT, BILATERAL    . COLONOSCOPY WITH PROPOFOL N/A 02/13/2015   Procedure: COLONOSCOPY WITH PROPOFOL;  Surgeon: Garlan Fair, MD;  Location: [facility] ENDOSCOPY;  Service: Endoscopy;  Laterality: N/A;  . CYSTOSCOPY W/ RETROGRADES Bilateral 04/12/2013   Procedure: CYSTOSCOPY WITH RETROGRADE PYELOGRAM;  Surgeon: [name], MD;  Location: [facility];  Service: Urology;  Laterality: Bilateral;  . ELECTROPHYSIOLOGIC STUDY N/A 01/26/2015   Procedure: Atrial Fibrillation Ablation;  Surgeon: [name], MD;  Location: [place] CV LAB;  Service: Cardiovascular;  Laterality: N/A;  . LEFT HEART CATHETERIZATION WITH CORONARY ANGIOGRAM N/A 11/24/2013   Procedure: LEFT HEART CATHETERIZATION WITH CORONARY ANGIOGRAM;  Surgeon: [name], MD;  Location: [facility] CATH LAB;  Service: Cardiovascular;  Laterality: N/A;  . PERCUTANEOUS CORONARY ROTOBLATOR INTERVENTION (PCI-R) N/A 11/28/2013   Procedure: PERCUTANEOUS CORONARY ROTOBLATOR INTERVENTION (PCI-R);  Surgeon: [name], MD;  Location: [facility] CATH LAB;  Service: Cardiovascular;  Laterality: N/A;  . PERCUTANEOUS CORONARY STENT INTERVENTION (PCI-S)  11/24/2013   Procedure: PERCUTANEOUS CORONARY STENT INTERVENTION (PCI-S);  Surgeon: [name], MD;  Location: [facility] CATH LAB;  Service: Cardiovascular;;  . TEE WITHOUT CARDIOVERSION  07/26/2012   Procedure: TRANSESOPHAGEAL ECHOCARDIOGRAM (TEE);  Surgeon: [name], MD;  Location: [facility] ENDOSCOPY;  Service: Cardiovascular;  Laterality: N/A;  . TEE WITHOUT CARDIOVERSION N/A 08/25/2014   Procedure: TRANSESOPHAGEAL ECHOCARDIOGRAM (TEE);  Surgeon: Larey Dresser, MD;  Location: [place];  Service: Cardiovascular;  Laterality: N/A;  . TEE WITHOUT CARDIOVERSION  01/26/2015   Procedure: Transesophageal Echocardiogram (Tee);  Surgeon: [name],  MD;  Location: [place] CV LAB;  Service: Cardiovascular;;  . TEE WITHOUT CARDIOVERSION N/A 07/04/2019   Procedure: TRANSESOPHAGEAL ECHOCARDIOGRAM (TEE);  Surgeon: Dorothy Spark, MD;  Location: [facility] ENDOSCOPY;  Service: Cardiovascular;  Laterality: N/A;  . TONSILLECTOMY  age 55  . TOTAL HIP ARTHROPLASTY  07/25/2011   Procedure: TOTAL HIP ARTHROPLASTY ANTERIOR APPROACH;  Surgeon: Mcarthur Rossetti;  Location: [facility] ORS;  Service: Orthopedics;  Laterality: Right;  . TOTAL HIP ARTHROPLASTY Left 02-08-2010  . TRANSURETHRAL RESECTION OF BLADDER TUMOR WITH GYRUS (TURBT-GYRUS) N/A 04/12/2013   Procedure: TRANSURETHRAL RESECTION OF BLADDER TUMOR WITH GYRUS (TURBT-GYRUS);  Surgeon: [name], MD;  Location: [facility];  Service: Urology;  Laterality: N/A;       Home Medications    Prior to Admission medications   Medication Sig Start Date End Date Taking? Authorizing Provider  bisoprolol-hydrochlorothiazide [facility]) 10-6.25 MG tablet Take 1 tablet Daily for BP 02/13/20  Yes [name], MD  Cholecalciferol (VITAMIN D3) 125 MCG (5000 UT) CAPS Take 10,000 units Daily 02/13/20  Yes [name], MD  diltiazem (CARTIA XT) 180 MG 24 hr capsule Take 1 capsule by mouth once daily 02/13/20  Yes [name], MD  ezetimibe (ZETIA) 10 MG tablet Take 1 tablet Daily for Cholesterol 02/13/20  Yes [name], MD  metoprolol tartrate (LOPRESSOR) 25 MG tablet  Take 1/2 tablet 2 x /day for BP & Heart 02/13/20  Yes [name], MD  Multiple Vitamin (MULTIVITAMIN WITH MINERALS) TABS tablet Take 1 tablet Daily 01/08/19  Yes [name], MD  rivaroxaban (XARELTO) 20 MG TABS tablet Take 1 tablet Daily to Prevent Blood Clots 02/13/20  Yes [name], MD  rosuvastatin (CRESTOR) 40 MG tablet Take     1 tablet     Daily     for Cholesterol 04/19/20  Yes [name], MD  mupirocin ointment (BACTROBAN) 2 % Apply 1 application topically 2 (two) times daily. 07/01/20    Tania Steinhauser C, PA-C  nitroGLYCERIN (NITROSTAT) 0.4 MG SL tablet DISSOLVE ONE TABLET UNDER THE TONGUE EVERY 5 MINUTES AS NEEDED FOR CHEST PAIN.DO NOT EXCEED A TOTAL OF 3 DOSES IN 15 MINUTES 02/13/20   [name], MD  Polyvinyl Alcohol-Povidone (REFRESH OP) Place 1 drop into both eyes daily as needed (dry eyes).    [provider]  zinc gluconate 50 MG tablet Take 50 mg by mouth daily.    [provider]    Family History Family History  Problem Relation Age of Onset  . Heart attack Mother   . Heart disease Mother   . Heart failure Father   . Heart disease Father     Social History Social History   Tobacco Use  . Smoking status: Former Smoker    Packs/day: 2.00    Years: 10.00    Pack years: 20.00    Types: Cigarettes    Quit date: 09/20/1981    Years since quitting: 38.8  . Smokeless tobacco: Never Used  Vaping Use  . Vaping Use: Never used  Substance Use Topics  . Alcohol use: No    Comment: pt states he has stopped drinking alcohol  . Drug use: No     Allergies   Patient has no known allergies.   Review of Systems Review of Systems  Constitutional: Negative for fatigue and fever.  Eyes: Negative for redness, itching and visual disturbance.  Respiratory: Negative for shortness of breath.   Cardiovascular: Negative for chest pain and leg swelling.  Gastrointestinal: Negative for nausea and vomiting.  Musculoskeletal: Positive for gait problem, joint swelling and myalgias. Negative for arthralgias.  Skin: Positive for color change and wound. Negative for rash.  Neurological: Negative for dizziness, syncope, weakness, light-headedness and headaches.     Physical Exam Triage Vital Signs ED Triage Vitals  Enc Vitals Group     BP      Pulse      Resp      Temp      Temp src      SpO2      Weight      Height      Head Circumference      Peak Flow      Pain Score      Pain Loc      Pain Edu?      Excl. in [place]?    No data  found.  Updated Vital Signs BP 129/64   Pulse (!) 59   Temp 98.3 F (36.8 C) (Oral)   Resp 16   SpO2 94%   Visual Acuity Right Eye Distance:   Left Eye Distance:   Bilateral Distance:    Right Eye Near:   Left Eye Near:    Bilateral Near:     Physical Exam Vitals and nursing note reviewed.  Constitutional:      Appearance: He is  well-developed and well-nourished.     Comments: No acute distress  HENT:     Head: Normocephalic and atraumatic.     Nose: Nose normal.  Eyes:     Conjunctiva/sclera: Conjunctivae normal.  Cardiovascular:     Rate and Rhythm: Normal rate.  Pulmonary:     Effort: Pulmonary effort is normal. No respiratory distress.  Abdominal:     General: There is no distension.  Musculoskeletal:        General: Normal range of motion.     Cervical back: Neck supple.     Comments: Left lower leg with diffuse ecchymosis anteriorly and medially with associated swelling, ankle and foot without swelling, dorsalis pedis 2+, brisk cap refill, foot warm  Skin:    General: Skin is warm and dry.     Comments: Scabbed abrasion to anterior shin on left  Neurological:     Mental Status: He is alert and oriented to person, place, and time.  Psychiatric:        Mood and Affect: Mood and affect normal.      UC Treatments / Results  Labs (all labs ordered are listed, but only abnormal results are displayed) Labs Reviewed - No data to display  EKG   Radiology No results found.  Procedures Procedures (including critical care time)  Medications Ordered in UC Medications - No data to display  Initial Impression / Assessment and Plan / UC Course  I have reviewed the triage vital signs and the nursing notes.  Pertinent labs & imaging results that were available during my care of the patient were reviewed by me and considered in my medical decision making (see chart for details).     X-ray negative for any acute bony abnormality, suspect most likely soft  tissue swelling inflammation and bruising related to Xarelto use.  Wound is dry and scabbing, appears to be healing, no signs of infection at this time.  Providing Ace wrap for compression to further help with swelling, recommending ice elevation and Tylenol for pain.  Bactroban topically to help prevent infection around wound.  Monitor for gradual healing over the next 1 to 2 weeks.  Follow-up for any concerns.  No signs of compartment syndrome at this time.  Discussed signs and symptoms to watch out for.  Discussed strict return precautions. Patient verbalized understanding and is agreeable with plan.  Final Clinical Impressions(s) / UC Diagnoses   Final diagnoses:  Pain and swelling of left lower leg  Abrasion of left lower extremity, initial encounter     Discharge Instructions     X-ray without signs of fracture Use Ace wrap for compression to help with swelling Ice and elevate May use antibiotic ointment around wound 1-2 times daily to help prevent infection, keep clean and dry Tylenol for pain/swelling  Return if not improving or worsening, becoming pale cold, numbness tingling    ED Prescriptions    Medication Sig Dispense Auth. Provider   mupirocin ointment (BACTROBAN) 2 % Apply 1 application topically 2 (two) times daily. 30 g Nia Nathaniel, [place] C, PA-C     PDMP not reviewed this encounter.   Janith Lima, PA-C 07/01/20 1147

## 2020-07-06 ENCOUNTER — Ambulatory Visit: Payer: Medicare Other | Admitting: Internal Medicine

## 2020-07-06 ENCOUNTER — Encounter: Payer: Self-pay | Admitting: Internal Medicine

## 2020-07-06 ENCOUNTER — Other Ambulatory Visit: Payer: Self-pay

## 2020-07-06 VITALS — BP 132/84 | HR 61 | Temp 97.6°F | Ht 69.0 in | Wt 193.0 lb

## 2020-07-06 DIAGNOSIS — L03116 Cellulitis of left lower limb: Secondary | ICD-10-CM | POA: Diagnosis not present

## 2020-07-06 MED ORDER — DOXYCYCLINE HYCLATE 100 MG PO CAPS: ORAL_CAPSULE | ORAL | 0 refills | Status: DC

## 2020-07-06 NOTE — Progress Notes (Signed)
   History of Present Illness:       Patient is a very nice  74 yo MWM with HTN/SHD who 1 week previously was involved in a riding lawn mower accident striking his left shin against a tree and initially he went to an Urgent Care Ctr & had negative X-rays and subsequently to an Orthopedic office.  Apparently was felt to have a wound infection of a deep abrasion of the left shin and was started on gKeflex 4 days ago.  He also had aspiration of a large hematoma of the left shin. He presents today with ongoing c/o pain in there left leg worse standing than sitting.  Medications  .  bisoprolol-hctz 10-6.25 MG tablet, Take 1 tablet Daily for BP .  diltiazem XT) 180 MG 24 hr capsule, Take 1 capsule once daily .  ezetimibe  10 MG tablet, Take 1 tablet Daily for Cholesterol .  NITROSTAT 0.4 MG AS NEEDED FOR CHEST PAIN .  rosuvastatin  40 MG tablet, Take     1 tablet     Daily     for Cholesterol  .  rivaroxaban (XARELTO) 20 MG TABS tablet, Take 1 tablet Daily to Prevent Blood Clots  .  VITAMIN D 5000 U  Take 10,000 units Daily .  Multi-Vit w/MinTABS tablet, Take 1 tablet Daily .  REFRESH , Place 1 drop into both eyes daily as needed (dry eyes). .  zinc 50 MG tablet, Take  daily.  Problem list He has Essential hypertension; Mitral regurgitation; Hyperlipidemia, mixed; Abnormal glucose; Vitamin D deficiency; CAD- CFX PTCA 11/24/13 with plans for DES 11/28/13; Bladder cancer ([place]); Gastroesophageal reflux disease; PAD (peripheral artery disease) ([place]); OSA on CPAP; History of colon polyps; Hearing loss of both ears; Overweight (BMI 25.0-29.9); Insomnia; Paroxysmal atrial fibrillation ([place]); and Aortic atherosclerosis (HCC) on their problem list.   Observations/Objective:   BP 132/84   Pulse 61   Temp 97.6 F (36.4 C)   Ht 5\' 9"  (1.753 m)   Wt 193 lb (87.5 kg)   SpO2 95%   BMI 28.50 kg/m   HEENT - WNL. Neck - supple.  Chest - Clear equal BS. Cor - Nl HS. RRR w/o sig MGR. PP 1(+). No  edema Skin - An warm area erythematous STS of the left shin measuring  approx 3-4"  x 8-9" w/ a skin tear and a deep abrasion centrally. No lymphangitic streaking or drainage.  MS- FROM w/o deformities.  Gait sl limp favoring the Left. Neuro -  Nl w/o focal abnormalities.  Assessment and Plan:  1. Cellulitis of left anterior lower leg  - Discussed wound care cleaning with soap & water rinsing with H2O2 and then keepim=ng dry to cover with a non-stick sterile gauze  - doxycycline 100 MG capsule; Take 1 capsule 2 x /day with meals for Infection  Dispense: 60 capsule;          I discussed the assessment and treatment plan with the patient. The patient was provided an opportunity to ask questions and all were answered. The patient agreed with the plan and demonstrated an understanding of the instructions.       The patient was advised to call back or seek an in-person evaluation if the symptoms worsen or if the condition fails to improve as anticipated.   Kirtland Bouchard, MD

## 2020-07-09 ENCOUNTER — Other Ambulatory Visit: Payer: Self-pay

## 2020-07-09 ENCOUNTER — Ambulatory Visit (HOSPITAL_COMMUNITY)
Admission: RE | Admit: 2020-07-09 | Discharge: 2020-07-09 | Disposition: A | Discharge status: Home / Self Care | Payer: Medicare Other | Source: Ambulatory Visit | Attending: Family Medicine | Admitting: Family Medicine

## 2020-07-09 ENCOUNTER — Other Ambulatory Visit (HOSPITAL_COMMUNITY): Payer: Self-pay | Admitting: Family Medicine

## 2020-07-09 DIAGNOSIS — M79605 Pain in left leg: Secondary | ICD-10-CM | POA: Insufficient documentation

## 2020-07-13 ENCOUNTER — Other Ambulatory Visit: Payer: Self-pay | Admitting: Cardiology

## 2020-07-13 DIAGNOSIS — I480 Paroxysmal atrial fibrillation: Secondary | ICD-10-CM

## 2020-07-13 DIAGNOSIS — I2510 Atherosclerotic heart disease of native coronary artery without angina pectoris: Secondary | ICD-10-CM

## 2020-07-16 NOTE — Telephone Encounter (Signed)
Xarelto 20mg  refill request received. Pt is 74 years old, weight-87.5kg, Crea-0.89 on 05/17/2020, last seen by Dr. 05/19/2020 on 03/16/2020, Diagnosis-Afib, CrCl-88.70ml/min; Dose is appropriate based on dosing criteria. Will send in refill to requested pharmacy.

## 2020-07-30 ENCOUNTER — Other Ambulatory Visit: Payer: Self-pay | Admitting: Cardiology

## 2020-07-30 DIAGNOSIS — E782 Mixed hyperlipidemia: Secondary | ICD-10-CM

## 2020-07-30 DIAGNOSIS — I2510 Atherosclerotic heart disease of native coronary artery without angina pectoris: Secondary | ICD-10-CM

## 2020-08-22 NOTE — Progress Notes (Signed)
Annual  Screening/Preventative Visit  & Comprehensive Evaluation & Examination      This very nice 75 y.o. MWM presents for a Screening /Preventative Visit & comprehensive evaluation and management of multiple medical co-morbidities.  Patient has been followed for HTN, ASCAD, HLD, Prediabetes and Vitamin D Deficiency. Patient has hx/o OSA, but is unfortunately mask intolerant.      HTN predates circa 1996. Patient's BP has been controlled at home.  Today's BP: 122/76. In 2015, patiebnt has PCA/Stenting and rotator Ablation. Patient has hx/o pAfib and multiple ablations & CV's . Patient is CHADsVasc 3 and is on Xarelto.  Patient denies any cardiac symptoms as chest pain, palpitations, shortness of breath, dizziness or ankle swelling.      Patient's hyperlipidemia is controlled with diet and medications. Patient denies myalgias or other medication SE's. Last lipids were at goal:  Lab Results  Component Value Date   CHOL 122 05/17/2020   HDL 62 05/17/2020   LDLCALC 47 05/17/2020   TRIG 57 05/17/2020   CHOLHDL 2.0 05/17/2020        Patient has hx/o prediabetes (A1c 5.7%/2011 &5.9%/2013) and patient denies reactive hypoglycemic symptoms, visual blurring, diabetic polys or paresthesias. Last A1c was normal & at goal:    Lab Results  Component Value Date   HGBA1C 5.3 02/14/2020         Finally, patient has history of Vitamin D Deficiency("27" /2008)and last vitamin D was at goal:   Lab Results  Component Value Date   VD25OH 84 02/14/2020    Current Outpatient Medications on File Prior to Visit  Medication Sig  . bisoprolol-hctz 10-6.25 MG tablet Take 1 tablet Daily for BP  . VITAMIN D 5000 UT Take 10,000 units Daily  . diltiazemXT 180 MG 24 hr  Take 1 capsule  daily  . ezetimibe  10 MG tablet TAKE 1 TABLET DAILY   . Multi-Vitamin w/Min Take 1 tablet Daily  . NITROSTAT 0.4 MG SL t AS NEEDED FOR CHEST PAIN  . REFRESH  Place 1 drop into both eyes daily as needed   .  rosuvastatin 40 MG  Take 1 tablet Daily     for Cholesterol  . XARELTO 20 MG  TAKE 1 TABLET  DAILY   . zinc  50 MG tablet Take daily    No Known Allergies  Past Medical History:  Diagnosis Date  . Bladder cancer ([place])   . Hearing loss of both ears   . History of colon polyps    BENIGN  . Hyperlipidemia   . Hypertension   . Mitral regurgitation   . OSA on CPAP    cpap settign of 13  . PAD (peripheral artery disease) (HCC)    ABI--  RIGHT SIDE NORMAL LEFT SIDE MODERATELY REDUCED W/ DISTAL LEFT SFA STENOSIS//  MILD CALDICATION  . PAF (paroxysmal atrial fibrillation) ([place])     a. s/p PVI 2014; b. s/p redo PVI and CTI 01/2015  . Pre-diabetes   . Vitamin D deficiency    Health Maintenance  Topic Date Due  . COVID-19 Vaccine (3 - Pfizer risk 4-dose series) 10/13/2019  . INFLUENZA VACCINE  02/19/2020  . COLONOSCOPY (Pts 45-54yrs Insurance coverage will need to be confirmed)  06/23/2022  . TETANUS/TDAP  09/23/2023  . Hepatitis C Screening  Completed  . PNA vac Low Risk Adult  Completed   Immunization History  Administered Date(s) Administered  . DT (Pediatric) 07/22/2003, 12/28/2013  . Influenza Split 04/14/2019  . Influenza  Whole 05/19/2013  . Influenza, High Dose Seasonal PF 04/08/2017  . Influenza,inj,Quad PF,6+ Mos 05/09/2014  . Influenza-Unspecified 05/14/2015, 05/01/2016, 05/17/2018  . PFIZER(Purple Top)SARS-COV-2 Vaccination 08/18/2019, 09/15/2019  . Pneumococcal Conjugate-13 07/11/2014  . Pneumococcal Polysaccharide-23 12/04/2009, 06/10/2016  . Tdap 09/22/2013   Last Colon -   Past Surgical History:  Procedure Laterality Date  . ABLATION OF DYSRHYTHMIC FOCUS  01/26/2015  . ATRIAL FIBRILLATION ABLATION N/A 07/27/2012   Procedure: ATRIAL FIBRILLATION ABLATION;  Surgeon: Hillis Range, MD;  Location: [facility] CATH LAB;  Service: Cardiovascular;  Laterality: N/A;  . ATRIAL FIBRILLATION ABLATION N/A 08/25/2018   Procedure: ATRIAL FIBRILLATION ABLATION;  Surgeon: Hillis Range,  MD;  Location: [facility] INVASIVE CV LAB;  Service: Cardiovascular;  Laterality: N/A;  . CARDIAC ELECTROPHYSIOLOGY STUDY AND ABLATION  07-27-2012  DR ALLRED   SUCCESSFUL ABLATION OF A-FIB  . CARDIOVERSION  08/06/2012   Procedure: CARDIOVERSION;  Surgeon: Pricilla Riffle, MD;  Location: [facility] ENDOSCOPY;  Service: Cardiovascular;  Laterality: N/A;  . CARDIOVERSION N/A 08/25/2014   Procedure: CARDIOVERSION;  Surgeon: Laurey Morale, MD;  Location: [facility] ENDOSCOPY;  Service: Cardiovascular;  Laterality: N/A;  . CARDIOVERSION N/A 11/13/2014   Procedure: CARDIOVERSION;  Surgeon: Quintella Reichert, MD;  Location: [facility] ENDOSCOPY;  Service: Cardiovascular;  Laterality: N/A;  . CARDIOVERSION N/A 12/28/2014   Procedure: CARDIOVERSION;  Surgeon: Pricilla Riffle, MD;  Location: [facility] ENDOSCOPY;  Service: Cardiovascular;  Laterality: N/A;  . CARDIOVERSION N/A 01/30/2017   Procedure: CARDIOVERSION;  Surgeon: Wendall Stade, MD;  Location: [facility] ENDOSCOPY;  Service: Cardiovascular;  Laterality: N/A;  . CARDIOVERSION N/A 04/06/2017   Procedure: CARDIOVERSION;  Surgeon: Lars Masson, MD;  Location: [facility] ENDOSCOPY;  Service: Cardiovascular;  Laterality: N/A;  . CARDIOVERSION N/A 05/25/2018   Procedure: CARDIOVERSION;  Surgeon: Chilton Si, MD;  Location: [facility] ENDOSCOPY;  Service: Cardiovascular;  Laterality: N/A;  . CARDIOVERSION N/A 08/09/2018   Procedure: CARDIOVERSION;  Surgeon: Jodelle Red, MD;  Location: [facility] ENDOSCOPY;  Service: Cardiovascular;  Laterality: N/A;  . CARDIOVERSION N/A 07/04/2019   Procedure: CARDIOVERSION;  Surgeon: Lars Masson, MD;  Location: [facility] ENDOSCOPY;  Service: Cardiovascular;  Laterality: N/A;  . CARDIOVERSION N/A 09/23/2019   Procedure: CARDIOVERSION;  Surgeon: Lars Masson, MD;  Location: [facility] ENDOSCOPY;  Service: Cardiovascular;  Laterality: N/A;  . CATARACT EXTRACTION W/ INTRAOCULAR LENS  IMPLANT, BILATERAL    . COLONOSCOPY WITH PROPOFOL N/A 02/13/2015   Procedure: COLONOSCOPY WITH PROPOFOL;   Surgeon: Charolett Bumpers, MD;  Location: [facility] ENDOSCOPY;  Service: Endoscopy;  Laterality: N/A;  . CYSTOSCOPY W/ RETROGRADES Bilateral 04/12/2013   Procedure: CYSTOSCOPY WITH RETROGRADE PYELOGRAM;  Surgeon: Sebastian Ache, MD;  Location: [facility];  Service: Urology;  Laterality: Bilateral;  . ELECTROPHYSIOLOGIC STUDY N/A 01/26/2015   Procedure: Atrial Fibrillation Ablation;  Surgeon: Hillis Range, MD;  Location: [facility] INVASIVE CV LAB;  Service: Cardiovascular;  Laterality: N/A;  . LEFT HEART CATHETERIZATION WITH CORONARY ANGIOGRAM N/A 11/24/2013   Procedure: LEFT HEART CATHETERIZATION WITH CORONARY ANGIOGRAM;  Surgeon: Kathleene Hazel, MD;  Location: [facility] CATH LAB;  Service: Cardiovascular;  Laterality: N/A;  . PERCUTANEOUS CORONARY ROTOBLATOR INTERVENTION (PCI-R) N/A 11/28/2013   Procedure: PERCUTANEOUS CORONARY ROTOBLATOR INTERVENTION (PCI-R);  Surgeon: Kathleene Hazel, MD;  Location: [facility] CATH LAB;  Service: Cardiovascular;  Laterality: N/A;  . PERCUTANEOUS CORONARY STENT INTERVENTION (PCI-S)  11/24/2013   Procedure: PERCUTANEOUS CORONARY STENT INTERVENTION (PCI-S);  Surgeon: Kathleene Hazel, MD;  Location: [facility] CATH LAB;  Service: Cardiovascular;;  . TEE WITHOUT CARDIOVERSION  07/26/2012   Procedure: TRANSESOPHAGEAL ECHOCARDIOGRAM (TEE);  Surgeon: [name], MD;  Location: [facility] ENDOSCOPY;  Service: Cardiovascular;  Laterality: N/A;  . TEE WITHOUT CARDIOVERSION N/A 08/25/2014   Procedure: TRANSESOPHAGEAL ECHOCARDIOGRAM (TEE);  Surgeon: Larey Dresser, MD;  Location: [place];  Service: Cardiovascular;  Laterality: N/A;  . TEE WITHOUT CARDIOVERSION  01/26/2015   Procedure: Transesophageal Echocardiogram (Tee);  Surgeon: [name], MD;  Location: [place] CV LAB;  Service: Cardiovascular;;  . TEE WITHOUT CARDIOVERSION N/A 07/04/2019   Procedure: TRANSESOPHAGEAL ECHOCARDIOGRAM (TEE);  Surgeon: Dorothy Spark, MD;  Location: [facility] ENDOSCOPY;  Service: Cardiovascular;   Laterality: N/A;  . TONSILLECTOMY  age 34  . TOTAL HIP ARTHROPLASTY  07/25/2011   Procedure: TOTAL HIP ARTHROPLASTY ANTERIOR APPROACH;  Surgeon: Mcarthur Rossetti;  Location: [facility] ORS;  Service: Orthopedics;  Laterality: Right;  . TOTAL HIP ARTHROPLASTY Left 02-08-2010  . TRANSURETHRAL RESECTION OF BLADDER TUMOR WITH GYRUS (TURBT-GYRUS) N/A 04/12/2013   Procedure: TRANSURETHRAL RESECTION OF BLADDER TUMOR WITH GYRUS (TURBT-GYRUS);  Surgeon: [name], MD;  Location: [facility];  Service: Urology;  Laterality: N/A;   Family History  Problem Relation Age of Onset  . Heart attack Mother   . Heart disease Mother   . Heart failure Father   . Heart disease Father      ROS Constitutional: Denies fever, chills, weight loss/gain, headaches, insomnia,  night sweats or change in appetite. Does c/o fatigue. Eyes: Denies redness, blurred vision, diplopia, discharge, itchy or watery eyes.  ENT: Denies discharge, congestion, post nasal drip, epistaxis, sore throat, earache, hearing loss, dental pain, Tinnitus, Vertigo, Sinus pain or snoring.  Cardio: Denies chest pain, palpitations, irregular heartbeat, syncope, dyspnea, diaphoresis, orthopnea, PND, claudication or edema Respiratory: denies cough, dyspnea, DOE, pleurisy, hoarseness, laryngitis or wheezing.  Gastrointestinal: Denies dysphagia, heartburn, reflux, water brash, pain, cramps, nausea, vomiting, bloating, diarrhea, constipation, hematemesis, melena, hematochezia, jaundice or hemorrhoids Genitourinary: Denies dysuria, frequency, urgency, nocturia, hesitancy, discharge, hematuria or flank pain Musculoskeletal: Denies arthralgia, myalgia, stiffness, Jt. Swelling, pain, limp or strain/sprain. Denies Falls. Skin: Denies puritis, rash, hives, warts, acne, eczema or change in skin lesion Neuro: No weakness, tremor, incoordination, spasms, paresthesia or pain Psychiatric: Denies confusion, memory loss or sensory loss. Denies  Depression. Endocrine: Denies change in weight, skin, hair change, nocturia, and paresthesia, diabetic polys, visual blurring or hyper / hypo glycemic episodes.  Heme/Lymph: No excessive bleeding, bruising or enlarged lymph nodes.  Physical Exam  BP 122/76   Pulse (!) 55   Temp (!) 97 F (36.1 C)   Resp 16   Ht 5\' 9"  (1.753 m)   Wt 199 lb 3.2 oz (90.4 kg)   SpO2 97%   BMI 29.42 kg/m   General Appearance: Well nourished and well groomed and in no apparent distress.  Eyes: PERRLA, EOMs, conjunctiva no swelling or erythema, normal fundi and vessels. Sinuses: No frontal/maxillary tenderness ENT/Mouth: EACs patent / TMs  nl. Nares clear without erythema, swelling, mucoid exudates. Oral hygiene is good. No erythema, swelling, or exudate. Tongue normal, non-obstructing. Tonsils not swollen or erythematous. Hearing normal.  Neck: Supple, thyroid not palpable. No bruits, nodes or JVD. Respiratory: Respiratory effort normal.  BS equal and clear bilateral without rales, rhonci, wheezing or stridor. Cardio: Heart sounds are normal with regular rate and rhythm and no murmurs, rubs or gallops. Peripheral pulses are normal and equal bilaterally without edema. No aortic or femoral bruits. Chest: symmetric with normal excursions and percussion.  Abdomen: Soft, with Nl  bowel sounds. Nontender, no guarding, rebound, hernias, masses, or organomegaly.  Lymphatics: Non tender without lymphadenopathy.  Musculoskeletal: Full ROM all peripheral extremities, joint stability, 5/5 strength, and normal gait. Skin: Warm and dry without rashes, lesions, cyanosis, clubbing or  ecchymosis.  Neuro: Cranial nerves intact, reflexes equal bilaterally. Normal muscle tone, no cerebellar symptoms. Sensation intact.  Pysch: Alert and oriented X 3 with normal affect, insight and judgment appropriate.   Assessment and Plan  1. Annual Preventative/Screening Exam     2. Essential hypertension  - EKG 12-Lead - [place],  RETROPERITNL ABD,  LTD - Urinalysis, Routine w reflex microscopic - Microalbumin / creatinine urine ratio - CBC with Differential/Platelet - COMPLETE METABOLIC PANEL WITH GFR - Magnesium - TSH  3. Hyperlipidemia, mixed  - EKG 12-Lead - [place], RETROPERITNL ABD,  LTD - Lipid panel - TSH  4. Abnormal glucose  - EKG 12-Lead - [place], RETROPERITNL ABD,  LTD - Hemoglobin A1c - Insulin, random  5. Vitamin D deficiency  - VITAMIN D 25 Hydroxy 6. OSA on CPAP   7. Atherosclerosis of native coronary artery  without angina pectoris  - EKG 12-Lead - [place], RETROPERITNL ABD,  LTD  8. Paroxysmal atrial fibrillation (HCC)  - EKG 12-Lead - [place], RETROPERITNL ABD,  LTD  9. Aortic atherosclerosis ([place]) by Abd CT Scan in 2015  - [place], RETROPERITNL ABD,  LTD - Urinalysis, Routine w reflex microscopic  10. BPH with obstruction/lower urinary tract symptoms  - PSA  11. Screening for colorectal cancer  - POC Hemoccult Bld/Stl\  12. Prostate cancer screening  - PSA  13. Screening for ischemic heart disease  - EKG 12-Lead  14. FHx: heart disease  - EKG 12-Lead - [place], RETROPERITNL ABD,  LTD  15. Former smoker  - EKG 12-Lead - [place], RETROPERITNL ABD,  LTD  16. Screening for AAA (aortic abdominal aneurysm)  - [place], RETROPERITNL ABD,  LTD  17. Medication management  - Urinalysis, Routine w reflex microscopic - Microalbumin / creatinine urine ratio - CBC with Differential/Platelet - COMPLETE METABOLIC PANEL WITH GFR - Magnesium - Lipid panel - TSH - Hemoglobin A1c - Insulin, random - VITAMIN D 25 Hydroxy         Patient was counseled in prudent diet, weight control to achieve/maintain BMI less than 25, BP monitoring, regular exercise and medications as discussed.  Discussed med effects and SE's. Routine screening labs and tests as requested with regular follow-up as recommended. Over 40 minutes of exam, counseling, chart review and high complex critical decision making was  performed   Kirtland Bouchard, MD

## 2020-08-22 NOTE — Patient Instructions (Signed)

## 2020-08-23 ENCOUNTER — Other Ambulatory Visit: Payer: Self-pay

## 2020-08-23 ENCOUNTER — Ambulatory Visit: Payer: Medicare Other | Admitting: Internal Medicine

## 2020-08-23 VITALS — BP 122/76 | HR 55 | Temp 97.0°F | Resp 16 | Ht 69.0 in | Wt 199.2 lb

## 2020-08-23 DIAGNOSIS — I10 Essential (primary) hypertension: Secondary | ICD-10-CM

## 2020-08-23 DIAGNOSIS — Z79899 Other long term (current) drug therapy: Secondary | ICD-10-CM

## 2020-08-23 DIAGNOSIS — R7309 Other abnormal glucose: Secondary | ICD-10-CM

## 2020-08-23 DIAGNOSIS — I480 Paroxysmal atrial fibrillation: Secondary | ICD-10-CM

## 2020-08-23 DIAGNOSIS — Z8249 Family history of ischemic heart disease and other diseases of the circulatory system: Secondary | ICD-10-CM

## 2020-08-23 DIAGNOSIS — N401 Enlarged prostate with lower urinary tract symptoms: Secondary | ICD-10-CM

## 2020-08-23 DIAGNOSIS — Z1211 Encounter for screening for malignant neoplasm of colon: Secondary | ICD-10-CM

## 2020-08-23 DIAGNOSIS — Z136 Encounter for screening for cardiovascular disorders: Secondary | ICD-10-CM

## 2020-08-23 DIAGNOSIS — E559 Vitamin D deficiency, unspecified: Secondary | ICD-10-CM

## 2020-08-23 DIAGNOSIS — Z87891 Personal history of nicotine dependence: Secondary | ICD-10-CM | POA: Diagnosis not present

## 2020-08-23 DIAGNOSIS — I2510 Atherosclerotic heart disease of native coronary artery without angina pectoris: Secondary | ICD-10-CM | POA: Diagnosis not present

## 2020-08-23 DIAGNOSIS — I700 Atherosclerosis of aorta: Secondary | ICD-10-CM

## 2020-08-23 DIAGNOSIS — G4733 Obstructive sleep apnea (adult) (pediatric): Secondary | ICD-10-CM

## 2020-08-23 DIAGNOSIS — N138 Other obstructive and reflux uropathy: Secondary | ICD-10-CM

## 2020-08-23 DIAGNOSIS — E782 Mixed hyperlipidemia: Secondary | ICD-10-CM

## 2020-08-23 DIAGNOSIS — Z0001 Encounter for general adult medical examination with abnormal findings: Secondary | ICD-10-CM

## 2020-08-23 DIAGNOSIS — Z0000 Encounter for general adult medical examination without abnormal findings: Secondary | ICD-10-CM | POA: Diagnosis not present

## 2020-08-23 DIAGNOSIS — Z125 Encounter for screening for malignant neoplasm of prostate: Secondary | ICD-10-CM

## 2020-08-24 LAB — TSH: TSH: 1.05 mIU/L (ref 0.40–4.50)

## 2020-08-24 LAB — CBC WITH DIFFERENTIAL/PLATELET
Absolute Monocytes: 501 cells/uL (ref 200–950)
Basophils Absolute: 33 cells/uL (ref 0–200)
Basophils Relative: 0.5 %
Eosinophils Absolute: 117 cells/uL (ref 15–500)
Eosinophils Relative: 1.8 %
HCT: 45.3 % (ref 38.5–50.0)
Hemoglobin: 15.3 g/dL (ref 13.2–17.1)
Lymphs Abs: 1580 cells/uL (ref 850–3900)
MCH: 30.1 pg (ref 27.0–33.0)
MCHC: 33.8 g/dL (ref 32.0–36.0)
MCV: 89.2 fL (ref 80.0–100.0)
MPV: 9.6 fL (ref 7.5–12.5)
Monocytes Relative: 7.7 %
Neutro Abs: 4271 cells/uL (ref 1500–7800)
Neutrophils Relative %: 65.7 %
Platelets: 213 10*3/uL (ref 140–400)
RBC: 5.08 10*6/uL (ref 4.20–5.80)
RDW: 11.9 % (ref 11.0–15.0)
Total Lymphocyte: 24.3 %
WBC: 6.5 10*3/uL (ref 3.8–10.8)

## 2020-08-24 LAB — URINALYSIS, ROUTINE W REFLEX MICROSCOPIC
Bilirubin Urine: NEGATIVE
Glucose, UA: NEGATIVE
Hgb urine dipstick: NEGATIVE
Ketones, ur: NEGATIVE
Leukocytes,Ua: NEGATIVE
Nitrite: NEGATIVE
Protein, ur: NEGATIVE
Specific Gravity, Urine: 1.011 (ref 1.001–1.03)
pH: 6.5 (ref 5.0–8.0)

## 2020-08-24 LAB — COMPLETE METABOLIC PANEL WITH GFR
AG Ratio: 2.2 (calc) (ref 1.0–2.5)
ALT: 19 U/L (ref 9–46)
AST: 19 U/L (ref 10–35)
Albumin: 4.3 g/dL (ref 3.6–5.1)
Alkaline phosphatase (APISO): 56 U/L (ref 35–144)
BUN: 13 mg/dL (ref 7–25)
CO2: 27 mmol/L (ref 20–32)
Calcium: 9.7 mg/dL (ref 8.6–10.3)
Chloride: 107 mmol/L (ref 98–110)
Creat: 0.79 mg/dL (ref 0.70–1.18)
GFR, Est African American: 102 mL/min/{1.73_m2} (ref >=60)
GFR, Est Non African American: 88 mL/min/{1.73_m2} (ref >=60)
Globulin: 2 g/dL (calc) (ref 1.9–3.7)
Glucose, Bld: 82 mg/dL (ref 65–99)
Potassium: 4 mmol/L (ref 3.5–5.3)
Sodium: 143 mmol/L (ref 135–146)
Total Bilirubin: 2 mg/dL — ABNORMAL HIGH (ref 0.2–1.2)
Total Protein: 6.3 g/dL (ref 6.1–8.1)

## 2020-08-24 LAB — LIPID PANEL
Cholesterol: 129 mg/dL (ref <200)
HDL: 59 mg/dL (ref >=40)
LDL Cholesterol (Calc): 54 mg/dL (calc)
Non-HDL Cholesterol (Calc): 70 mg/dL (calc) (ref <130)
Total CHOL/HDL Ratio: 2.2 (calc) (ref <5.0)
Triglycerides: 81 mg/dL (ref <150)

## 2020-08-24 LAB — PSA: PSA: 0.6 ng/mL (ref <=4.0)

## 2020-08-24 LAB — VITAMIN D 25 HYDROXY (VIT D DEFICIENCY, FRACTURES): Vit D, 25-Hydroxy: 100 ng/mL (ref 30–100)

## 2020-08-24 LAB — MICROALBUMIN / CREATININE URINE RATIO
Creatinine, Urine: 93 mg/dL (ref 20–320)
Microalb Creat Ratio: 3 mcg/mg creat (ref <30)
Microalb, Ur: 0.3 mg/dL

## 2020-08-24 LAB — MAGNESIUM: Magnesium: 2 mg/dL (ref 1.5–2.5)

## 2020-08-24 LAB — HEMOGLOBIN A1C
Hgb A1c MFr Bld: 5.3 % of total Hgb (ref <5.7)
Mean Plasma Glucose: 105 mg/dL
eAG (mmol/L): 5.8 mmol/L

## 2020-08-24 LAB — INSULIN, RANDOM: Insulin: 5.6 u[IU]/mL

## 2020-08-24 NOTE — Progress Notes (Signed)
========================================================== -   Test results slightly outside the reference range are not unusual. If there is anything important, I will review this with you,  otherwise it is considered normal test values.  If you have further questions,  please do not hesitate to contact me at the office or via My Chart.  ========================================================== ==========================================================  -  PSA - very Low - Great  ========================================================== ==========================================================  -  Total Chol = 129 and LDL Chol = 54 - Both  Excellent   - Very low risk for Heart Attack  / Stroke ========================================================  - A1c - Normal - Great - No Diabetes  ========================================================== ==========================================================  -  Vitamin D = 100 - Excellent   ! ! !  ========================================================== ==========================================================  -  All Else - CBC - Kidneys - Electrolytes - Liver - Magnesium & Thyroid    - all  Normal / OK ===========================================================

## 2020-08-25 ENCOUNTER — Encounter: Payer: Self-pay | Admitting: Internal Medicine

## 2020-09-10 ENCOUNTER — Other Ambulatory Visit: Payer: Self-pay | Admitting: Cardiology

## 2020-09-10 DIAGNOSIS — I10 Essential (primary) hypertension: Secondary | ICD-10-CM

## 2020-09-10 DIAGNOSIS — I2510 Atherosclerotic heart disease of native coronary artery without angina pectoris: Secondary | ICD-10-CM

## 2020-09-17 ENCOUNTER — Ambulatory Visit (INDEPENDENT_AMBULATORY_CARE_PROVIDER_SITE_OTHER): Payer: Medicare Other | Admitting: Internal Medicine

## 2020-09-17 ENCOUNTER — Encounter: Payer: Self-pay | Admitting: Internal Medicine

## 2020-09-17 ENCOUNTER — Other Ambulatory Visit: Payer: Self-pay

## 2020-09-17 VITALS — BP 132/68 | HR 64 | Ht 69.0 in | Wt 201.6 lb

## 2020-09-17 DIAGNOSIS — I10 Essential (primary) hypertension: Secondary | ICD-10-CM | POA: Diagnosis not present

## 2020-09-17 DIAGNOSIS — G4733 Obstructive sleep apnea (adult) (pediatric): Secondary | ICD-10-CM | POA: Diagnosis not present

## 2020-09-17 DIAGNOSIS — D6869 Other thrombophilia: Secondary | ICD-10-CM | POA: Diagnosis not present

## 2020-09-17 DIAGNOSIS — I4819 Other persistent atrial fibrillation: Secondary | ICD-10-CM

## 2020-09-17 NOTE — Progress Notes (Signed)
PCP: [name], MD   Primary EP: Dr Sharmon Leyden is a 75 y.o. male who presents today for routine electrophysiology followup.  Since last being seen in our clinic, the patient reports doing very well.  Today, he denies symptoms of palpitations, chest pain, shortness of breath,  lower extremity edema, dizziness, presyncope, or syncope.  The patient is otherwise without complaint today.   Past Medical History:  Diagnosis Date  . Bladder cancer ([place])   . Hearing loss of both ears   . History of colon polyps    BENIGN  . Hyperlipidemia   . Hypertension   . Mitral regurgitation   . OSA on CPAP    cpap settign of 13  . PAD (peripheral artery disease) (HCC)    ABI--  RIGHT SIDE NORMAL LEFT SIDE MODERATELY REDUCED W/ DISTAL LEFT SFA STENOSIS//  MILD CALDICATION  . PAF (paroxysmal atrial fibrillation) ([place])     a. s/p PVI 2014; b. s/p redo PVI and CTI 01/2015  . Pre-diabetes   . Vitamin D deficiency    Past Surgical History:  Procedure Laterality Date  . ABLATION OF DYSRHYTHMIC FOCUS  01/26/2015  . ATRIAL FIBRILLATION ABLATION N/A 07/27/2012   Procedure: ATRIAL FIBRILLATION ABLATION;  Surgeon: [name], MD;  Location: [facility] CATH LAB;  Service: Cardiovascular;  Laterality: N/A;  . ATRIAL FIBRILLATION ABLATION N/A 08/25/2018   Procedure: ATRIAL FIBRILLATION ABLATION;  Surgeon: [name], MD;  Location: [place] CV LAB;  Service: Cardiovascular;  Laterality: N/A;  . CARDIAC ELECTROPHYSIOLOGY STUDY AND ABLATION  07-27-2012  DR ALLRED   SUCCESSFUL ABLATION OF A-FIB  . CARDIOVERSION  08/06/2012   Procedure: CARDIOVERSION;  Surgeon: [name], MD;  Location: [place];  Service: Cardiovascular;  Laterality: N/A;  . CARDIOVERSION N/A 08/25/2014   Procedure: CARDIOVERSION;  Surgeon: Larey Dresser, MD;  Location: [place];  Service: Cardiovascular;  Laterality: N/A;  . CARDIOVERSION N/A 11/13/2014   Procedure: CARDIOVERSION;  Surgeon: Sueanne Margarita, MD;   Location: [facility] ENDOSCOPY;  Service: Cardiovascular;  Laterality: N/A;  . CARDIOVERSION N/A 12/28/2014   Procedure: CARDIOVERSION;  Surgeon: [name], MD;  Location: [place];  Service: Cardiovascular;  Laterality: N/A;  . CARDIOVERSION N/A 01/30/2017   Procedure: CARDIOVERSION;  Surgeon: Josue Hector, MD;  Location: [facility] ENDOSCOPY;  Service: Cardiovascular;  Laterality: N/A;  . CARDIOVERSION N/A 04/06/2017   Procedure: CARDIOVERSION;  Surgeon: Dorothy Spark, MD;  Location: [facility] ENDOSCOPY;  Service: Cardiovascular;  Laterality: N/A;  . CARDIOVERSION N/A 05/25/2018   Procedure: CARDIOVERSION;  Surgeon: Skeet Latch, MD;  Location: [place];  Service: Cardiovascular;  Laterality: N/A;  . CARDIOVERSION N/A 08/09/2018   Procedure: CARDIOVERSION;  Surgeon: Buford Dresser, MD;  Location: [facility] ENDOSCOPY;  Service: Cardiovascular;  Laterality: N/A;  . CARDIOVERSION N/A 07/04/2019   Procedure: CARDIOVERSION;  Surgeon: Dorothy Spark, MD;  Location: [facility] ENDOSCOPY;  Service: Cardiovascular;  Laterality: N/A;  . CARDIOVERSION N/A 09/23/2019   Procedure: CARDIOVERSION;  Surgeon: Dorothy Spark, MD;  Location: [place];  Service: Cardiovascular;  Laterality: N/A;  . CATARACT EXTRACTION W/ INTRAOCULAR LENS  IMPLANT, BILATERAL    . COLONOSCOPY WITH PROPOFOL N/A 02/13/2015   Procedure: COLONOSCOPY WITH PROPOFOL;  Surgeon: Garlan Fair, MD;  Location: [facility] ENDOSCOPY;  Service: Endoscopy;  Laterality: N/A;  . CYSTOSCOPY W/ RETROGRADES Bilateral 04/12/2013   Procedure: CYSTOSCOPY WITH RETROGRADE PYELOGRAM;  Surgeon: [name], MD;  Location[facility];  Service: Urology;  Laterality: Bilateral;  .  ELECTROPHYSIOLOGIC STUDY N/A 01/26/2015   Procedure: Atrial Fibrillation Ablation;  Surgeon: [name], MD;  Location: [place] CV LAB;  Service: Cardiovascular;  Laterality: N/A;  . LEFT HEART CATHETERIZATION WITH CORONARY ANGIOGRAM N/A 11/24/2013   Procedure: LEFT  HEART CATHETERIZATION WITH CORONARY ANGIOGRAM;  Surgeon: [name], MD;  Location: [facility] CATH LAB;  Service: Cardiovascular;  Laterality: N/A;  . PERCUTANEOUS CORONARY ROTOBLATOR INTERVENTION (PCI-R) N/A 11/28/2013   Procedure: PERCUTANEOUS CORONARY ROTOBLATOR INTERVENTION (PCI-R);  Surgeon: [name], MD;  Location: [facility] CATH LAB;  Service: Cardiovascular;  Laterality: N/A;  . PERCUTANEOUS CORONARY STENT INTERVENTION (PCI-S)  11/24/2013   Procedure: PERCUTANEOUS CORONARY STENT INTERVENTION (PCI-S);  Surgeon: [name], MD;  Location: [facility] CATH LAB;  Service: Cardiovascular;;  . TEE WITHOUT CARDIOVERSION  07/26/2012   Procedure: TRANSESOPHAGEAL ECHOCARDIOGRAM (TEE);  Surgeon: [name], MD;  Location: [facility] ENDOSCOPY;  Service: Cardiovascular;  Laterality: N/A;  . TEE WITHOUT CARDIOVERSION N/A 08/25/2014   Procedure: TRANSESOPHAGEAL ECHOCARDIOGRAM (TEE);  Surgeon: Larey Dresser, MD;  Location: [place];  Service: Cardiovascular;  Laterality: N/A;  . TEE WITHOUT CARDIOVERSION  01/26/2015   Procedure: Transesophageal Echocardiogram (Tee);  Surgeon: [name], MD;  Location: [place] CV LAB;  Service: Cardiovascular;;  . TEE WITHOUT CARDIOVERSION N/A 07/04/2019   Procedure: TRANSESOPHAGEAL ECHOCARDIOGRAM (TEE);  Surgeon: Dorothy Spark, MD;  Location: [facility] ENDOSCOPY;  Service: Cardiovascular;  Laterality: N/A;  . TONSILLECTOMY  age 70  . TOTAL HIP ARTHROPLASTY  07/25/2011   Procedure: TOTAL HIP ARTHROPLASTY ANTERIOR APPROACH;  Surgeon: Mcarthur Rossetti;  Location: [facility] ORS;  Service: Orthopedics;  Laterality: Right;  . TOTAL HIP ARTHROPLASTY Left 02-08-2010  . TRANSURETHRAL RESECTION OF BLADDER TUMOR WITH GYRUS (TURBT-GYRUS) N/A 04/12/2013   Procedure: TRANSURETHRAL RESECTION OF BLADDER TUMOR WITH GYRUS (TURBT-GYRUS);  Surgeon: [name], MD;  Location: [facility];  Service: Urology;  Laterality: N/A;    ROS- all systems are reviewed and  negatives except as per HPI above  Current Outpatient Medications  Medication Sig Dispense Refill  . bisoprolol-hydrochlorothiazide (ZIAC) 10-6.25 MG tablet Take 1 tablet Daily for BP 90 tablet 3  . Cholecalciferol (VITAMIN D3) 125 MCG (5000 UT) CAPS Take 10,000 units Daily 30 capsule   . diltiazem (CARDIZEM CD) 180 MG 24 hr capsule Take 1 capsule (180 mg total) by mouth daily. Take 1 capsule by mouth once daily 90 capsule 1  . ezetimibe (ZETIA) 10 MG tablet TAKE 1 TABLET BY MOUTH ONCE DAILY FOR CHOLESTEROL 90 tablet 2  . Multiple Vitamin (MULTIVITAMIN WITH MINERALS) TABS tablet Take 1 tablet Daily    . nitroGLYCERIN (NITROSTAT) 0.4 MG SL tablet DISSOLVE ONE TABLET UNDER THE TONGUE EVERY 5 MINUTES AS NEEDED FOR CHEST PAIN.DO NOT EXCEED A TOTAL OF 3 DOSES IN 15 MINUTES 25 tablet 6  . Polyvinyl Alcohol-Povidone (REFRESH OP) Place 1 drop into both eyes daily as needed (dry eyes).    . rosuvastatin (CRESTOR) 40 MG tablet Take     1 tablet     Daily     for Cholesterol 90 tablet 0  . XARELTO 20 MG TABS tablet TAKE 1 TABLET BY MOUTH ONCE DAILY WITH SUPPER 90 tablet 2   No current facility-administered medications for this visit.    Physical Exam: Vitals:   09/17/20 0936  BP: 132/68  Pulse: 64  SpO2: 95%  Weight: 201 lb 9.6 oz (91.4 kg)  Height: 5\' 9"  (1.753 m)    GEN- The patient is well appearing,  alert and oriented x 3 today.   Head- normocephalic, atraumatic Eyes-  Sclera clear, conjunctiva pink Ears- hearing intact Oropharynx- clear Lungs- Clear to ausculation bilaterally, normal work of breathing Heart- Regular rate and rhythm, no murmurs, rubs or gallops, PMI not laterally displaced GI- soft, NT, ND, + BS Extremities- no clubbing, cyanosis, or edema  Wt Readings from Last 3 Encounters:  09/17/20 201 lb 9.6 oz (91.4 kg)  08/23/20 199 lb 3.2 oz (90.4 kg)  07/06/20 193 lb (87.5 kg)    EKG tracing ordered today is personally reviewed and shows sinus  Assessment and  Plan:  1. Persistent afib Doing well post ablation Required cardiorsion 12/20 and 3/21 chads2vasc score is 3.  He is on xarelto  2. HTN Stable No change required today  3. CAD No ischemic symptoms  4. OSA Compliance with CPAP is advised Follows with Dr Maxwell Caul  Risks, benefits and potential toxicities for medications prescribed and/or refilled reviewed with patient today.   Return to see EP PA in 6 months  [name] MD, [facility] 09/17/2020 9:53 AM

## 2020-09-17 NOTE — Patient Instructions (Signed)
Medication Instructions:  Your physician recommends that you continue on your current medications as directed. Please refer to the Current Medication list given to you today.  Labwork: None ordered.  Testing/Procedures: None ordered.  Follow-Up: Your physician wants you to follow-up in: 6 months with James Allred, MD or one of the following Advanced Practice Providers on your designated Care Team:    Renee Ursuy, PA-C    You will receive a reminder letter in the mail two months in advance. If you don't receive a letter, please call our office to schedule the follow-up appointment.   Any Other Special Instructions Will Be Listed Below (If Applicable).  If you need a refill on your cardiac medications before your next appointment, please call your pharmacy.        

## 2020-11-29 ENCOUNTER — Other Ambulatory Visit: Payer: Self-pay

## 2020-11-29 ENCOUNTER — Ambulatory Visit: Payer: Medicare Other | Admitting: Adult Health

## 2020-11-29 ENCOUNTER — Encounter: Payer: Self-pay | Admitting: Adult Health

## 2020-11-29 VITALS — BP 122/70 | HR 62 | Temp 97.7°F | Ht 69.0 in | Wt 201.0 lb

## 2020-11-29 DIAGNOSIS — Z86010 Personal history of colonic polyps: Secondary | ICD-10-CM

## 2020-11-29 DIAGNOSIS — I2510 Atherosclerotic heart disease of native coronary artery without angina pectoris: Secondary | ICD-10-CM

## 2020-11-29 DIAGNOSIS — R6889 Other general symptoms and signs: Secondary | ICD-10-CM

## 2020-11-29 DIAGNOSIS — C679 Malignant neoplasm of bladder, unspecified: Secondary | ICD-10-CM

## 2020-11-29 DIAGNOSIS — G4700 Insomnia, unspecified: Secondary | ICD-10-CM

## 2020-11-29 DIAGNOSIS — Z0001 Encounter for general adult medical examination with abnormal findings: Secondary | ICD-10-CM | POA: Diagnosis not present

## 2020-11-29 DIAGNOSIS — I340 Nonrheumatic mitral (valve) insufficiency: Secondary | ICD-10-CM

## 2020-11-29 DIAGNOSIS — I480 Paroxysmal atrial fibrillation: Secondary | ICD-10-CM

## 2020-11-29 DIAGNOSIS — E782 Mixed hyperlipidemia: Secondary | ICD-10-CM

## 2020-11-29 DIAGNOSIS — K219 Gastro-esophageal reflux disease without esophagitis: Secondary | ICD-10-CM

## 2020-11-29 DIAGNOSIS — I739 Peripheral vascular disease, unspecified: Secondary | ICD-10-CM

## 2020-11-29 DIAGNOSIS — M79672 Pain in left foot: Secondary | ICD-10-CM

## 2020-11-29 DIAGNOSIS — I700 Atherosclerosis of aorta: Secondary | ICD-10-CM

## 2020-11-29 DIAGNOSIS — E663 Overweight: Secondary | ICD-10-CM

## 2020-11-29 DIAGNOSIS — G4733 Obstructive sleep apnea (adult) (pediatric): Secondary | ICD-10-CM

## 2020-11-29 DIAGNOSIS — Z0000 Encounter for general adult medical examination without abnormal findings: Secondary | ICD-10-CM

## 2020-11-29 DIAGNOSIS — R7309 Other abnormal glucose: Secondary | ICD-10-CM

## 2020-11-29 DIAGNOSIS — I10 Essential (primary) hypertension: Secondary | ICD-10-CM

## 2020-11-29 DIAGNOSIS — E559 Vitamin D deficiency, unspecified: Secondary | ICD-10-CM

## 2020-11-29 DIAGNOSIS — H9193 Unspecified hearing loss, bilateral: Secondary | ICD-10-CM

## 2020-11-29 MED ORDER — ROSUVASTATIN CALCIUM 40 MG PO TABS: ORAL_TABLET | ORAL | 3 refills | Status: DC

## 2020-11-29 NOTE — Progress Notes (Signed)
Patient ID: Damon [name], male   DOB: September 17, 1945, 75 y.o.   MRN: [id number]  MEDICARE ANNUAL WELLNESS VISIT AND OV  Assessment:    Encounter for Medicare annual wellness exam Due annually   Atherosclerosis of aorta Per CT 2015 Control blood pressure, cholesterol, glucose, increase exercise.   Essential hypertension - continue medications, DASH diet, exercise and monitor at home. Call if greater than 130/80.  - CBC with Differential/Platelet - CMP/GFR - TSH   Paroxysmal atrial fibrillation (HCC) Rate controlled; successful ablation 08/25/2018 Continue follow up cardio Continue xarelto  CAD Control blood pressure, cholesterol, glucose, increase exercise.  Followed by cardiology No recent chest pain/nitroglycerine use   Peripheral arterial disease (HCC) No claudication, cardiology follows Control blood pressure, cholesterol, glucose, increase exercise.    Hyperlipidemia -continue medications - rosuvastatin 20 mg daily, zetia 10 mg daily       -check lipids, decrease fatty foods, increase activity.  - Lipid panel  Other abnormal glucose Recent A1Cs at goal Discussed diet/exercise, weight management  Defer A1C; check CMP  Vitamin D deficiency At goal at recent check; continue to recommend supplementation for goal of 70-100 Defer vitamin D level   Malignant neoplasm of urinary bladder, unspecified site [facility]) Continue close monitoring by urology  OSA on CPAP Sleep apnea- continue CPAP, weight loss advised.    Medication management - Magnesium   Mitral regurgitation Continue cardio follow up  Gastroesophageal reflux disease, esophagitis presence not specified Continue PPI/H2 blocker, diet discussed  BMI 29 Long discussion about weight loss, diet, and exercise Recommended diet heavy in fruits and veggies and low in animal meats, cheeses, and dairy products, appropriate calorie intake Discussed appropriate weight for height Increase activity level  Follow up  at next visit  Insomnia Doing well off of xanax - good sleep hygiene discussed, increase day time activity, try melatonin or benadryl if needed  History of colon polyps Colonoscopy UTD; due 2023; increase fiber  Bilateral hearing loss High frequency only, doesn't want hearing aids at this time  Left heel pain Suspect plantar fascitis though possible fat pad atrophy element Xray in system can get if not improving Try padding heel, continue arch support, avoid walking on hard surfaces, wear cushioned shoes Exercises given, encouraged frozen water bottle rolling on sole Try topical antiinflammatories Follow up if not improving, can refer to podiatry    Future Appointments  Date Time Provider Department Center  03/05/2021 10:30 AM [name], MD [facility] None  09/04/2021  3:00 PM [name], MD [facility] None  11/29/2021 10:30 AM Judd Gaudier, NP [facility] None     Plan:   During the course of the visit the patient was educated and counseled about appropriate screening and preventive services including:    Pneumococcal vaccine   Influenza vaccine  Td vaccine  Screening electrocardiogram  Bone densitometry screening  Colorectal cancer screening  Diabetes screening  Glaucoma screening  Nutrition counseling   Advanced directives: requested  Subjective:   Damon [name]  presents for Medicare Annual Wellness Visit and 3 month follow up for Hypertension, Hyperlipidemia, Pre-Diabetes and Vitamin D Deficiency.   He reports pain in L heel, worse when he initially gets up and starts walking, has tried some shoe inserts with some benefit but not resolving, ongoing for 1 month.   Patient was taking xanax for insomnia but was able to taper off, reports is sleeping better since getting off of the medication.   In Sept 2014 , Patient had TUR of a Bladder  cancer by Dr Tresa Moore, follows regularly.  He has OSA and is on a CPAP.   BMI is Body mass index is  29.68 kg/m., he has been working on diet and exercise; typically golfs once a week, does do yardwork several hours, was riding bike regularly, hasn't recently but receptive to restarting. Also admits has been eating more since feeling unwell.  Wt Readings from Last 3 Encounters:  11/29/20 201 lb (91.2 kg)  09/17/20 201 lb 9.6 oz (91.4 kg)  08/23/20 199 lb 3.2 oz (90.4 kg)   Patient has HTN. Today's BP: 122/70.   Patient has ASCAD  S/p PCA/stenting followed by Rotator Ablation in 2015 (Dr Aundra Dubin).   Patient also has hx/o pAfib (on Xarelto) with RFA  x 2 in 2014/2016. He has had several episodes in 2019 with successful conversion; patient went successful ablation in Feb 2020, coversion in March 2021 (a. Fib after second shot for covid 19) and has remained in sinus rhythm.  He has PAD, denies recent symptoms, improved with increased exercise.  Aortic atherosclerosis per CT 2015.  Patient has had no complaints of any cardiac type chest pain, palpitations, dyspnea/orthopnea/PND, dizziness, claudication, or dependent edema.  Hyperlipidemia is controlled with diet & meds, he is on crestor, taking 20 mg daily, never took 40 mg, never had myalgias, zetia was added to get to LDL <70. Patient denies myalgias or other med SE's. Last Lipids were at goal -   Lab Results  Component Value Date   CHOL 129 08/23/2020   HDL 59 08/23/2020   LDLCALC 54 08/23/2020   TRIG 81 08/23/2020   CHOLHDL 2.2 08/23/2020   Also, the patient has history of PreDiabetes with A1c 5.7% in 2011 and 5.9% in 2013, and then 5.6% x 2 in late 2013 now controlled by diet.  He's had no symptoms of reactive hypoglycemia, diabetic polys, paresthesias or visual blurring.   Lab Results  Component Value Date   HGBA1C 5.3 08/23/2020    Last GFR:  Lab Results  Component Value Date   GFRNONAA 88 08/23/2020   Further, the patient also has history of Vitamin D Deficiency of 27 in 2008 and supplements vitamin D without any suspected  side-effects. Lab Results  Component Value Date   VD25OH 100 08/23/2020      Medication Review: Current Outpatient Medications on File Prior to Visit  Medication Sig  . bisoprolol-hydrochlorothiazide (ZIAC) 10-6.25 MG tablet Take 1 tablet Daily for BP  . Cholecalciferol (VITAMIN D3) 125 MCG (5000 UT) CAPS Take 10,000 units Daily  . diltiazem (CARDIZEM CD) 180 MG 24 hr capsule Take 1 capsule (180 mg total) by mouth daily. Take 1 capsule by mouth once daily  . ezetimibe (ZETIA) 10 MG tablet TAKE 1 TABLET BY MOUTH ONCE DAILY FOR CHOLESTEROL  . Multiple Vitamin (MULTIVITAMIN WITH MINERALS) TABS tablet Take 1 tablet Daily  . nitroGLYCERIN (NITROSTAT) 0.4 MG SL tablet DISSOLVE ONE TABLET UNDER THE TONGUE EVERY 5 MINUTES AS NEEDED FOR CHEST PAIN.DO NOT EXCEED A TOTAL OF 3 DOSES IN 15 MINUTES  . Polyvinyl Alcohol-Povidone (REFRESH OP) Place 1 drop into both eyes daily as needed (dry eyes).  [name] tablet TAKE 1 TABLET BY MOUTH ONCE DAILY WITH SUPPER   No current facility-administered medications on file prior to visit.    Current Problems (verified) Patient Active Problem List   Diagnosis Date Noted  . Aortic atherosclerosis ([place]) by Abd CT Scan in 2015 05/16/2020  . Paroxysmal atrial fibrillation (HCC)   .  Insomnia 03/23/2018  . Overweight (BMI 25.0-29.9) 08/17/2017  . PAD (peripheral artery disease) ([place])   . OSA on CPAP   . History of colon polyps   . Hearing loss of both ears   . Bladder cancer ([place]) 02/16/2015  . Gastroesophageal reflux disease 02/16/2015  . CAD- CFX PTCA 11/24/13  11/25/2013  . Hyperlipidemia, mixed 06/22/2013  . Abnormal glucose 06/22/2013  . Vitamin D deficiency 06/22/2013  . Essential hypertension   . Mitral regurgitation     Screening Tests Immunization History  Administered Date(s) Administered  . DT (Pediatric) 07/22/2003, 12/28/2013  . Influenza Split 04/14/2019  . Influenza Whole 05/19/2013  . Influenza, High Dose Seasonal PF  04/08/2017  . Influenza,inj,Quad PF,6+ Mos 05/09/2014  . Influenza-Unspecified 05/14/2015, 05/01/2016, 05/17/2018  . PFIZER(Purple Top)SARS-COV-2 Vaccination 08/18/2019, 09/15/2019  . Pneumococcal Conjugate-13 07/11/2014  . Pneumococcal Polysaccharide-23 12/04/2009, 06/10/2016  . Tdap 09/22/2013   TDAP 2015 Influenza 2020, skipped Prevnar 13 2015 Pneumonia 2011, 2017 Shingrix: declines  Covid 19: 2/2, 2021, pfizer, declining booster at this time, ? 2nd shot triggered a. Fib.   CT chest 10/2014  Preventative care: Last colonoscopy: 06/2019, Dr. Inda Coke, polyp, 3 year follow up   Names of Other Physician/Practitioners you currently use: 1.  [facility] for [facility] 2. [facility], eye doctor, last visit 2021 3. ? Name of his dentist, dentist, last visit 2014, several years, no issues, encouraged routine screening and cleaning  Patient Care Team: [name], MD as PCP - General (Internal Medicine) [name], MD as PCP - Cardiology (Cardiology) [name], MD as PCP - Electrophysiology (Cardiology) Dwan Bolt, MD as Referring Physician (Cardiology) [name], MD as Consulting Physician (Cardiology) [name], MD as Consulting Physician (Urology) Mcarthur Rossetti, MD as Consulting Physician (Orthopedic Surgery) Ponciano Ort, MD as Referring Physician (Urology) Garlan Fair, MD as Consulting Physician (Gastroenterology)   Allergies No Known Allergies  SURGICAL HISTORY He  has a past surgical history that includes Total hip arthroplasty (07/25/2011); TEE without cardioversion (07/26/2012); Cardioversion (08/06/2012); Total hip arthroplasty (Left, 02-08-2010); Cataract extraction w/ intraocular lens  implant, bilateral; Cardiac electrophysiology study and ablation (07-27-2012  DR ALLRED); Tonsillectomy (age 73); Cystoscopy w/ retrogrades (Bilateral, 04/12/2013); Transurethral resection  of bladder tumor with gyrus (turbt-gyrus) (N/A, 04/12/2013); atrial fibrillation ablation (N/A, 07/27/2012); left heart catheterization with coronary angiogram (N/A, 11/24/2013); percutaneous coronary stent intervention (pci-s) (11/24/2013); percutaneous coronary rotoblator intervention (pci-r) (N/A, 11/28/2013); TEE without cardioversion (N/A, 08/25/2014); Cardioversion (N/A, 08/25/2014); Cardioversion (N/A, 11/13/2014); Cardioversion (N/A, 12/28/2014); Cardiac catheterization (N/A, 01/26/2015); TEE without cardioversion (01/26/2015); Ablation of dysrhythmic focus (01/26/2015); Colonoscopy with propofol (N/A, 02/13/2015); Cardioversion (N/A, 01/30/2017); Cardioversion (N/A, 04/06/2017); Cardioversion (N/A, 05/25/2018); Cardioversion (N/A, 08/09/2018); ATRIAL FIBRILLATION ABLATION (N/A, 08/25/2018); TEE without cardioversion (N/A, 07/04/2019); Cardioversion (N/A, 07/04/2019); and Cardioversion (N/A, 09/23/2019). FAMILY HISTORY His family history includes Heart attack in his mother; Heart disease in his father and mother; Heart failure in his father. SOCIAL HISTORY He  reports that he quit smoking about 39 years ago. His smoking use included cigarettes. He has a 20.00 pack-year smoking history. He has never used smokeless tobacco. He reports that he does not drink alcohol and does not use drugs.  MEDICARE WELLNESS OBJECTIVES: Physical activity: Current Exercise Habits: Home exercise routine, Type of exercise: Other - see comments (golfing and yardwork), Time (Minutes): 60, Frequency (Times/Week): 4, Weekly Exercise (Minutes/Week): 240, Intensity: Mild, Exercise limited by: None identified Cardiac risk factors: Cardiac Risk Factors include: advanced age (>4men, >82 women);dyslipidemia;hypertension;male  gender;smoking/ tobacco exposure Depression/mood screen:   Depression screen [facility] 2/9 11/29/2020  Decreased Interest 0  Down, Depressed, Hopeless 0  PHQ - 2 Score 0  Some recent data might be hidden    ADLs:  In your present state  of health, do you have any difficulty performing the following activities: 11/29/2020 08/25/2020  Hearing? N N  Comment - -  Vision? N N  Difficulty concentrating or making decisions? N N  Walking or climbing stairs? N N  Dressing or bathing? N N  Doing errands, shopping? N N  Some recent data might be hidden     Cognitive Testing  Alert? Yes  Normal Appearance?Yes  Oriented to person? Yes  Place? Yes   Time? Yes  Recall of three objects?  Yes  Can perform simple calculations? Yes  Displays appropriate judgment?Yes  Can read the correct time from a watch face?Yes  EOL planning: Does Patient Have a Medical Advance Directive?: No Would patient like information on creating a medical advance directive?: No - Patient declined    Objective:     Blood pressure 122/70, pulse 62, temperature 97.7 F (36.5 C), height 5\' 9"  (1.753 m), weight 201 lb (91.2 kg), SpO2 94 %.   General Appearance:  Alert  WD/WN, male  in no apparent distress. Eyes: PERRLA, EOMs nl, conjunctiva normal Sinuses: No frontal/maxillary tenderness ENT/Mouth: EACs patent / TMs  nl. Nares clear without erythema, swelling, mucoid exudates. Oral hygiene is good. No erythema, swelling, or exudate. Tongue normal, non-obstructing. Tonsils not swollen or erythematous. Not notably HOH today with masks on.  Neck: Supple, thyroid normal. No bruits, nodes or JVD. Respiratory: Respiratory effort normal.  BS equal and clear bilateral without rales, rhonci, wheezing or stridor. Cardio: Heart sounds are normal with regular rate and rhythm and no murmurs, rubs or gallops. Peripheral pulses are normal and equal bilaterally without edema. No aortic or femoral bruits. Chest: symmetric with normal excursions and percussion.  Abdomen: Flat, soft, with nl bowel sounds. No guarding, rebound, hernias, masses, or organomegaly. Mild suprapubic tenderness Lymphatics: Non tender without lymphadenopathy.  Musculoskeletal: Full ROM all peripheral  extremities, joint stability, 5/5 strength, and normal gait. Patient is able to ambulate well. Gait is not  Antalgic. He has left heel pain, over fat pad area, no anterior heel tenderness.  Neuro: Cranial nerves intact, reflexes equal bilaterally. Normal muscle tone, no cerebellar symptoms. Sensation intact.  Pysch: Alert and oriented X 3 with normal affect, insight and judgment appropriate.    Medicare Attestation I have personally reviewed: The patient's medical and social history Their use of alcohol, tobacco or illicit drugs Their current medications and supplements The patient's functional ability including ADLs,fall risks, home safety risks, cognitive, and hearing and visual impairment Diet and physical activities Evidence for depression or mood disorders  The patient's weight, height, BMI, and visual acuity have been recorded in the chart.  I have made referrals, counseling, and provided education to the patient based on review of the above and I have provided the patient with a written personalized care plan for preventive services.  Over 40 minutes of exam, counseling, chart review was performed.  Izora Ribas, NP   11/29/2020

## 2020-11-29 NOTE — Patient Instructions (Addendum)
Damon [name] , Thank you for taking time to come for your Medicare Wellness Visit. I appreciate your ongoing commitment to your health goals. Please review the following plan we discussed and let me know if I can assist you in the future.   These are the goals we discussed: Goals    . Exercise 150 min/wk Moderate Activity    . Weight (lb) < 180 lb (81.6 kg)       This is a list of the screening recommended for you and due dates:  Health Maintenance  Topic Date Due  . COVID-19 Vaccine (3 - Pfizer risk 4-dose series) 10/13/2019  . Flu Shot  02/18/2021  . Colon Cancer Screening  06/23/2022  . Tetanus Vaccine  09/23/2023  . Hepatitis C Screening: USPSTF Recommendation to screen - Ages 40-79 yo.  Completed  . Pneumonia vaccines  Completed  . HPV Vaccine  Aged Out    Can get xray at [address] imaging center - walk in without appointment 8-4 Mon-Fri - will show if any heel spur, but won't really change treatment  Try stretches padding on heel, avoid walking barefoot on hard surface floors, cushioned insole/heel shoes or inserts   can do antiinflammatories - topical voltaren/diclofenac/aspercreme can also be helpful  Frozen water bottle with towel rolling along heel and sole with stretching can be helpful.   Heel Pad Atrophy Rehab Ask your health care provider which exercises are safe for you. Do exercises exactly as told by your health care provider and adjust them as directed. It is normal to feel mild stretching, pulling, tightness, or discomfort as you do these exercises. Stop the exercise right away if you feel sudden pain or your pain gets worse. Do not begin these exercises until told by your health care provider. Stretching exercises These exercises improve the movement and flexibility of your calf muscles. These exercises may also help to relieve pain and stiffness. Standing gastroc stretch This exercise is also called a standing calf (gastroc) stretch. 1. Stand with  your hands against a wall. 2. Extend your left / right leg behind you, and bend your front knee slightly. Your heels should be on the floor. 3. Keeping your heels on the floor and your back knee straight, shift your weight toward the wall. You should feel a gentle stretch in the back of your lower leg (calf). 4. Hold this position for __________ seconds. Repeat __________ times. Complete this exercise __________ times a day.   Gastroc and soleus stretch, standing This is an exercise in which you stand on a step and use your body weight to stretch your calf muscles. To do this exercise: 1. Stand with the ball of your left / right foot on a step. The ball of your foot is on the walking surface, right under your toes. 2. Keep your other foot firmly on the same step. 3. Hold on to the wall, a railing, or a chair for balance. 4. Slowly lift your other foot, allowing your body weight to press your left / right heel down over the edge of the step. You should feel a stretch in your left / right calf. 5. Hold this position for __________ seconds. 6. Return both feet to the step. 7. Repeat this exercise with a slight bend in your left / right knee. Repeat __________ times. Complete this exercise __________ times a day. Strengthening exercise This exercise builds strength and endurance in your foot muscles and may help to take pressure off  your heel. Endurance is the ability to use your muscles for a long time, even after they get tired. Arch lifts This exercise is sometimes called foot intrinsics. This is an exercise in which you lift the arch part of your foot only. To do this exercise: 1. Sit in a chair with your feet flat on the floor. 2. Keeping your big toe and your heel on the floor, lift only your arch, which is on the inner edge of your left / right foot. Do not move your knee or scrunch your toes. This is a small movement. 3. Hold this position for __________ seconds. 4. Return to the starting  position. Repeat __________ times. Complete this exercise __________ times a day. This information is not intended to replace advice given to you by your health care provider. Make sure you discuss any questions you have with your health care provider. Document Revised: 10/28/2018 Document Reviewed: 04/20/2018 Elsevier Patient Education  2021 [employer].

## 2020-11-30 ENCOUNTER — Ambulatory Visit
Admission: RE | Admit: 2020-11-30 | Discharge: 2020-11-30 | Disposition: A | Discharge status: Home / Self Care | Payer: Medicare Other | Source: Ambulatory Visit | Attending: Adult Health | Admitting: Adult Health

## 2020-11-30 DIAGNOSIS — M79672 Pain in left foot: Secondary | ICD-10-CM

## 2020-11-30 LAB — CBC WITH DIFFERENTIAL/PLATELET
Absolute Monocytes: 680 cells/uL (ref 200–950)
Basophils Absolute: 48 cells/uL (ref 0–200)
Basophils Relative: 0.6 %
Eosinophils Absolute: 208 cells/uL (ref 15–500)
Eosinophils Relative: 2.6 %
HCT: 47.9 % (ref 38.5–50.0)
Hemoglobin: 15.7 g/dL (ref 13.2–17.1)
Lymphs Abs: 1608 cells/uL (ref 850–3900)
MCH: 29.3 pg (ref 27.0–33.0)
MCHC: 32.8 g/dL (ref 32.0–36.0)
MCV: 89.5 fL (ref 80.0–100.0)
MPV: 9.6 fL (ref 7.5–12.5)
Monocytes Relative: 8.5 %
Neutro Abs: 5456 cells/uL (ref 1500–7800)
Neutrophils Relative %: 68.2 %
Platelets: 228 10*3/uL (ref 140–400)
RBC: 5.35 10*6/uL (ref 4.20–5.80)
RDW: 12.5 % (ref 11.0–15.0)
Total Lymphocyte: 20.1 %
WBC: 8 10*3/uL (ref 3.8–10.8)

## 2020-11-30 LAB — LIPID PANEL
Cholesterol: 133 mg/dL (ref <200)
HDL: 60 mg/dL (ref >=40)
LDL Cholesterol (Calc): 58 mg/dL (calc)
Non-HDL Cholesterol (Calc): 73 mg/dL (calc) (ref <130)
Total CHOL/HDL Ratio: 2.2 (calc) (ref <5.0)
Triglycerides: 69 mg/dL (ref <150)

## 2020-11-30 LAB — COMPLETE METABOLIC PANEL WITH GFR
AG Ratio: 2 (calc) (ref 1.0–2.5)
ALT: 23 U/L (ref 9–46)
AST: 22 U/L (ref 10–35)
Albumin: 4.4 g/dL (ref 3.6–5.1)
Alkaline phosphatase (APISO): 63 U/L (ref 35–144)
BUN: 18 mg/dL (ref 7–25)
CO2: 27 mmol/L (ref 20–32)
Calcium: 9.9 mg/dL (ref 8.6–10.3)
Chloride: 106 mmol/L (ref 98–110)
Creat: 0.81 mg/dL (ref 0.70–1.18)
GFR, Est African American: 101 mL/min/{1.73_m2} (ref >=60)
GFR, Est Non African American: 87 mL/min/{1.73_m2} (ref >=60)
Globulin: 2.2 g/dL (calc) (ref 1.9–3.7)
Glucose, Bld: 96 mg/dL (ref 65–99)
Potassium: 4 mmol/L (ref 3.5–5.3)
Sodium: 141 mmol/L (ref 135–146)
Total Bilirubin: 2.1 mg/dL — ABNORMAL HIGH (ref 0.2–1.2)
Total Protein: 6.6 g/dL (ref 6.1–8.1)

## 2020-11-30 LAB — MAGNESIUM: Magnesium: 2.2 mg/dL (ref 1.5–2.5)

## 2020-11-30 LAB — TSH: TSH: 0.79 mIU/L (ref 0.40–4.50)

## 2021-01-14 ENCOUNTER — Other Ambulatory Visit: Payer: Self-pay

## 2021-01-14 MED ORDER — ROSUVASTATIN CALCIUM 40 MG PO TABS: ORAL_TABLET | ORAL | 3 refills | Status: DC

## 2021-02-04 ENCOUNTER — Other Ambulatory Visit: Payer: Self-pay

## 2021-02-04 DIAGNOSIS — I10 Essential (primary) hypertension: Secondary | ICD-10-CM

## 2021-02-04 DIAGNOSIS — I2510 Atherosclerotic heart disease of native coronary artery without angina pectoris: Secondary | ICD-10-CM

## 2021-02-04 MED ORDER — BISOPROLOL-HYDROCHLOROTHIAZIDE 10-6.25 MG PO TABS: ORAL_TABLET | ORAL | 3 refills | Status: DC

## 2021-02-26 ENCOUNTER — Encounter (HOSPITAL_COMMUNITY): Payer: Self-pay

## 2021-02-26 ENCOUNTER — Ambulatory Visit (HOSPITAL_COMMUNITY): Payer: Medicare Other | Admitting: Physician Assistant

## 2021-02-27 ENCOUNTER — Other Ambulatory Visit: Payer: Self-pay | Admitting: Urology

## 2021-02-28 NOTE — Progress Notes (Signed)
Left message with selita @ [facility] need directions for eliquis management for 03-06-2021 surgery

## 2021-03-01 ENCOUNTER — Other Ambulatory Visit: Payer: Self-pay | Admitting: Internal Medicine

## 2021-03-01 DIAGNOSIS — I10 Essential (primary) hypertension: Secondary | ICD-10-CM

## 2021-03-01 DIAGNOSIS — I2510 Atherosclerotic heart disease of native coronary artery without angina pectoris: Secondary | ICD-10-CM

## 2021-03-01 DIAGNOSIS — I700 Atherosclerosis of aorta: Secondary | ICD-10-CM

## 2021-03-01 DIAGNOSIS — I480 Paroxysmal atrial fibrillation: Secondary | ICD-10-CM

## 2021-03-04 ENCOUNTER — Other Ambulatory Visit: Payer: Self-pay

## 2021-03-04 ENCOUNTER — Encounter (HOSPITAL_BASED_OUTPATIENT_CLINIC_OR_DEPARTMENT_OTHER): Payer: Self-pay | Admitting: Urology

## 2021-03-04 ENCOUNTER — Encounter: Payer: Self-pay | Admitting: Internal Medicine

## 2021-03-04 NOTE — Progress Notes (Signed)
Spoke w/ via phone for pre-op interview---pt Lab needs dos----  I stat             Lab results------see below COVID test -----patient states asymptomatic no test needed Arrive at -------930 am 03-06-2021 NPO after MN NO Solid Food.  Clear liquids from MN until---830 am then npo Med rec completed Medications to take morning of surgery -----diltiazem, zetia, rosuvastatin Diabetic medication -----n/a Patient instructed no nail polish to be worn day of surgery Patient instructed to bring photo id and insurance card day of surgery Patient aware to have Driver (ride ) / caregiver wife susan    for 24 hours after surgery  Patient Special Instructions -----bring cpap mask tubing and machine and leave in car Patient verbalized understanding of instructions that were given at this phone interview. Patient denies shortness of breath, chest pain, fever, cough at this phone interview.   Anesthesia Review:hx of paf, htn, pvd, bladder cancer osa with cpap used, pt states no cardiac s & s  or sob at pre op call and pt states has never used nitroglycerin.  PCP:dr [name] lov 08-23-2020 epic Cardiologist : dr Meryl Crutch 09-17-2020 epic Chest x-ray :none Ct cardiac morph 08-18-2018 epic EKG :09-17-2020 epic Echo :tee 07-03-2021 0 epic Stress test:gated 04-17-2016 epic Cardiac Cath : none Activity level: does yard work and household chores without difficulty can climb flight of stairs without difficulty Sleep Study/ CPAP :uses cpap does not know settings Blood Thinner/ Instructions /Last Dose:pt to stay on xarelto per dr Tresa Moore instructions last dose is 03-05-2021

## 2021-03-04 NOTE — Progress Notes (Signed)
Future Appointments  Date Time Provider [place]  03/05/2021 10:30 AM [name], MD [facility] None  04/08/2021  9:20 AM Shirley Friar, PA-C [facility]  09/04/2021  - CPR  3:00 PM [name], MD [facility] None  11/29/2021  - Wellness 10:30 AM Liane Comber, NP [facility] None    History of Present Illness:                                                                                                               This very nice 75 y.o. MWM presents for 6  month follow up with HTN,  ASCAD/pAfib, HLD, Pre-Diabetes and Vitamin D Deficiency. Abd CT Scan in 2015 showed Aortic Atherosclerosis.       Patient is treated for HTN (1996)  & BP has been controlled at home. Today's BP is at goal - 122/68.      In 2015, patient had PCA/Stenting and rotator Ablation. Patient has hx/o pAfib and multiple ablations & CV's and is on maintenance Xarelto.  Patient has had no complaints of any cardiac type chest pain, palpitations, dyspnea / orthopnea / PND, dizziness, claudication, or dependent edema.       Hyperlipidemia is controlled with diet & meds. Patient denies myalgias or other med SE's. Last Lipids were at goal:  Lab Results  Component Value Date   CHOL 133 11/29/2020   HDL 60 11/29/2020   LDLCALC 58 11/29/2020   TRIG 69 11/29/2020   CHOLHDL 2.2 11/29/2020     Also, the patient has history of PreDiabetes  (A1c 5.7% /2011 & 5.9% /2013) and has had no symptoms of reactive hypoglycemia, diabetic polys, paresthesias or visual blurring.  Last A1c was normal & at goal:  Lab Results  Component Value Date   HGBA1C 5.3 08/23/2020        Further, the patient also has history of Vitamin D Deficiency ("27" /2008) and supplements vitamin D without any suspected side-effects. Last vitamin D was at goal:  Lab Results  Component Value Date   VD25OH 100 08/23/2020     Current Outpatient Medications on File Prior to Visit  Medication  Sig   diltiazem (CARDIZEM CD) 180 MG 24 hr capsule Take 1 capsule by mouth once daily   bisoprolol-hydrochlorothiazide (ZIAC) 10-6.25 MG tablet Take 1 tablet Daily for BP   VITAMIN D 5000 u Take 10,000 units Daily   ezetimibe (10 MG tablet TAKE 1 TABLET DAILY FOR    MULTIVITAMIN w/MINERALS Take 1 tablet Daily   NITROSTAT 0.4 MG SL tab AS NEEDED FOR CHEST PAIN   REFRESH Place 1 drop into both eyes daily as needed (dry eyes).   rosuvastatin  40 MG tablet Take 1/2 tablet Daily for Cholesterol   XARELTO 20 MG TABS TAKE 1 TABLET ONCE DAILY WITH SUPPER    No Known Allergies   PMHx:   Past Medical History:  Diagnosis Date   Bladder cancer ([place])    Hearing loss  of both ears    History of colon polyps    BENIGN   Hyperlipidemia    Hypertension    Mitral regurgitation    OSA on CPAP    PAD (peripheral artery disease) (HCC)    ABI--  RIGHT SIDE NORMAL LEFT SIDE MODERATELY REDUCED W/ DISTAL LEFT SFA STENOSIS//  MILD CALDICATION   PAF (paroxysmal atrial fibrillation) ([place])     a. s/p PVI 2014; b. s/p redo PVI and CTI 01/2015   Vitamin D deficiency      Immunization History  Administered Date(s) Administered   DT (Pediatric) 07/22/2003, 12/28/2013   Influenza Split 04/14/2019   Influenza Whole 05/19/2013   Influenza, High Dose Seasonal PF 04/08/2017   Influenza,inj,Quad PF,6+ Mos 05/09/2014   Influenza-Unspecified 05/14/2015, 05/01/2016, 05/17/2018   PFIZER(Purple Top)SARS-COV-2 Vaccination 08/18/2019, 09/15/2019   Pneumococcal Conjugate-13 07/11/2014   Pneumococcal Polysaccharide-23 12/04/2009, 06/10/2016   Tdap 09/22/2013     Past Surgical History:  Procedure Laterality Date   ABLATION OF DYSRHYTHMIC FOCUS  01/26/2015   ATRIAL FIBRILLATION ABLATION N/A 07/27/2012   Procedure: ATRIAL FIBRILLATION ABLATION;  Surgeon: [name], MD;  Location[facility] CATH LAB;  Service: Cardiovascular;  Laterality: N/A;   ATRIAL FIBRILLATION ABLATION N/A 08/25/2018   Procedure: ATRIAL  FIBRILLATION ABLATION;  Surgeon: [name], MD;  Location: [place] CV LAB;  Service: Cardiovascular;  Laterality: N/A;   CARDIAC ELECTROPHYSIOLOGY STUDY AND ABLATION  07-27-2012  DR [name]   SUCCESSFUL ABLATION OF A-FIB   CARDIOVERSION  08/06/2012   Procedure: CARDIOVERSION;  Surgeon: [name], MD;  Location: [place];  Service: Cardiovascular;  Laterality: N/A;   CARDIOVERSION N/A 08/25/2014   Procedure: CARDIOVERSION;  Surgeon: Larey Dresser, MD;  Location: [place];  Service: Cardiovascular;  Laterality: N/A;   CARDIOVERSION N/A 11/13/2014   Procedure: CARDIOVERSION;  Surgeon: Sueanne Margarita, MD;  Location: [place];  Service: Cardiovascular;  Laterality: N/A;   CARDIOVERSION N/A 12/28/2014   Procedure: CARDIOVERSION;  Surgeon: [name], MD;  Location: [place];  Service: Cardiovascular;  Laterality: N/A;   CARDIOVERSION N/A 01/30/2017   Procedure: CARDIOVERSION;  Surgeon: Josue Hector, MD;  Location: [facility] ENDOSCOPY;  Service: Cardiovascular;  Laterality: N/A;   CARDIOVERSION N/A 04/06/2017   Procedure: CARDIOVERSION;  Surgeon: Dorothy Spark, MD;  Location: [facility] ENDOSCOPY;  Service: Cardiovascular;  Laterality: N/A;   CARDIOVERSION N/A 05/25/2018   Procedure: CARDIOVERSION;  Surgeon: Skeet Latch, MD;  Location: [place];  Service: Cardiovascular;  Laterality: N/A;   CARDIOVERSION N/A 08/09/2018   Procedure: CARDIOVERSION;  Surgeon: Buford Dresser, MD;  Location: [facility] ENDOSCOPY;  Service: Cardiovascular;  Laterality: N/A;   CARDIOVERSION N/A 07/04/2019   Procedure: CARDIOVERSION;  Surgeon: Dorothy Spark, MD;  Location: [facility] ENDOSCOPY;  Service: Cardiovascular;  Laterality: N/A;   CARDIOVERSION N/A 09/23/2019   Procedure: CARDIOVERSION;  Surgeon: Dorothy Spark, MD;  Location: [place];  Service: Cardiovascular;  Laterality: N/A;   CATARACT EXTRACTION W/ INTRAOCULAR LENS  IMPLANT, BILATERAL     COLONOSCOPY WITH PROPOFOL N/A  02/13/2015   Procedure: COLONOSCOPY WITH PROPOFOL;  Surgeon: Garlan Fair, MD;  Location: [facility] ENDOSCOPY;  Service: Endoscopy;  Laterality: N/A;   CYSTOSCOPY W/ RETROGRADES Bilateral 04/12/2013   Procedure: CYSTOSCOPY WITH RETROGRADE PYELOGRAM;  Surgeon: [name], MD;  Location: [facility];  Service: Urology;  Laterality: Bilateral;   ELECTROPHYSIOLOGIC STUDY N/A 01/26/2015   Procedure: Atrial Fibrillation Ablation;  Surgeon: [name], MD;  Location: [place] CV LAB;  Service: Cardiovascular;  Laterality: N/A;   JOINT REPLACEMENT     LEFT HEART CATHETERIZATION WITH CORONARY ANGIOGRAM N/A 11/24/2013   Procedure: LEFT HEART CATHETERIZATION WITH CORONARY ANGIOGRAM;  Surgeon: [name], MD;  Location: [facility] CATH LAB;  Service: Cardiovascular;  Laterality: N/A;   PERCUTANEOUS CORONARY ROTOBLATOR INTERVENTION (PCI-R) N/A 11/28/2013   Procedure: PERCUTANEOUS CORONARY ROTOBLATOR INTERVENTION (PCI-R);  Surgeon: [name], MD;  Location: [facility] CATH LAB;  Service: Cardiovascular;  Laterality: N/A;   PERCUTANEOUS CORONARY STENT INTERVENTION (PCI-S)  11/24/2013   Procedure: PERCUTANEOUS CORONARY STENT INTERVENTION (PCI-S);  Surgeon: [name], MD;  Location: [facility] CATH LAB;  Service: Cardiovascular;;   TEE WITHOUT CARDIOVERSION  07/26/2012   Procedure: TRANSESOPHAGEAL ECHOCARDIOGRAM (TEE);  Surgeon: [name], MD;  Location: [facility] ENDOSCOPY;  Service: Cardiovascular;  Laterality: N/A;   TEE WITHOUT CARDIOVERSION N/A 08/25/2014   Procedure: TRANSESOPHAGEAL ECHOCARDIOGRAM (TEE);  Surgeon: Larey Dresser, MD;  Location: [place];  Service: Cardiovascular;  Laterality: N/A;   TEE WITHOUT CARDIOVERSION  01/26/2015   Procedure: Transesophageal Echocardiogram (Tee);  Surgeon: [name], MD;  Location: [place] CV LAB;  Service: Cardiovascular;;   TEE WITHOUT CARDIOVERSION N/A 07/04/2019   Procedure: TRANSESOPHAGEAL ECHOCARDIOGRAM (TEE);   Surgeon: Dorothy Spark, MD;  Location: [facility] ENDOSCOPY;  Service: Cardiovascular;  Laterality: N/A;   TONSILLECTOMY  age 64   TOTAL HIP ARTHROPLASTY  07/25/2011   Procedure: TOTAL HIP ARTHROPLASTY ANTERIOR APPROACH;  Surgeon: Mcarthur Rossetti;  Location: [facility] ORS;  Service: Orthopedics;  Laterality: Right;   TOTAL HIP ARTHROPLASTY Left 02/08/2010   TRANSURETHRAL RESECTION OF BLADDER TUMOR WITH GYRUS (TURBT-GYRUS) N/A 04/12/2013   Procedure: TRANSURETHRAL RESECTION OF BLADDER TUMOR WITH GYRUS (TURBT-GYRUS);  Surgeon: [name], MD;  Location: [facility];  Service: Urology;  Laterality: N/A;    FHx:    Reviewed / unchanged  SHx:    Reviewed / unchanged   Systems Review:  Constitutional: Denies fever, chills, wt changes, headaches, insomnia, fatigue, night sweats, change in appetite. Eyes: Denies redness, blurred vision, diplopia, discharge, itchy, watery eyes.  ENT: Denies discharge, congestion, post nasal drip, epistaxis, sore throat, earache, hearing loss, dental pain, tinnitus, vertigo, sinus pain, snoring.  CV: Denies chest pain, palpitations, irregular heartbeat, syncope, dyspnea, diaphoresis, orthopnea, PND, claudication or edema. Respiratory: denies cough, dyspnea, DOE, pleurisy, hoarseness, laryngitis, wheezing.  Gastrointestinal: Denies dysphagia, odynophagia, heartburn, reflux, water brash, abdominal pain or cramps, nausea, vomiting, bloating, diarrhea, constipation, hematemesis, melena, hematochezia  or hemorrhoids. Genitourinary: Denies dysuria, frequency, urgency, nocturia, hesitancy, discharge, hematuria or flank pain. Musculoskeletal: Denies arthralgias, myalgias, stiffness, jt. swelling, pain, limping or strain/sprain.  Skin: Denies pruritus, rash, hives, warts, acne, eczema or change in skin lesion(s). Neuro: No weakness, tremor, incoordination, spasms, paresthesia or pain. Psychiatric: Denies confusion, memory loss or sensory loss. Endo: Denies  change in weight, skin or hair change.  Heme/Lymph: No excessive bleeding, bruising or enlarged lymph nodes.  Physical Exam  BP 122/68   Pulse (!) 55   Temp (!) 97.3 F (36.3 C)   Ht '5\' 9"'$  (1.753 m)   Wt 203 lb 9.6 oz (92.4 kg)   SpO2 98%   BMI 30.07 kg/m   Appears  well nourished, well groomed  and in no distress.  Eyes: PERRLA, EOMs, conjunctiva no swelling or erythema. Sinuses: No frontal/maxillary tenderness ENT/Mouth: EAC's clear, TM's nl w/o erythema, bulging. Nares clear w/o erythema, swelling, exudates. Oropharynx clear without erythema or exudates. Oral hygiene is good. Tongue normal, non obstructing. Hearing intact.  Neck: Supple. Thyroid not palpable. Car 2+/2+ without bruits, nodes or JVD. Chest: Respirations nl with BS clear & equal w/o rales, rhonchi, wheezing or stridor.  Cor: Heart sounds normal w/ regular rate and rhythm without sig. murmurs, gallops, clicks or rubs. Peripheral pulses normal and equal  without edema.  Abdomen: Soft & bowel sounds normal. Non-tender w/o guarding, rebound, hernias, masses or organomegaly.  Lymphatics: Unremarkable.  Musculoskeletal: Full ROM all peripheral extremities, joint stability, 5/5 strength and normal gait.  Skin: Warm, dry without exposed rashes, lesions or ecchymosis apparent.  Neuro: Cranial nerves intact, reflexes equal bilaterally. Sensory-motor testing grossly intact. Tendon reflexes grossly intact.  Pysch: Alert & oriented x 3.  Insight and judgement nl & appropriate. No ideations.  Assessment and Plan:  1. Essential hypertension  - Continue medication, monitor blood pressure at home.  - Continue DASH diet.  Reminder to go to the ER if any CP,  SOB, nausea, dizziness, severe HA, changes vision/speech.   - CBC with Differential/Platelet - COMPLETE METABOLIC PANEL WITH GFR - Magnesium - TSH  2. Hyperlipidemia, mixed  - Continue diet/meds, exercise,& lifestyle modifications.  - Continue monitor periodic  cholesterol/liver & renal functions    - Lipid panel - TSH  3. Abnormal glucose  - Continue diet, exercise  - Lifestyle modifications.  - Monitor appropriate labs   - Hemoglobin A1c - Insulin, random  4. Vitamin D deficiency  - Continue supplementation.    - VITAMIN D 25 Hydroxy   5. Paroxysmal atrial fibrillation (HCC)  - TSH  6. Atherosclerosis of native coronary artery without angina  pectoris, unspecified whether native or transplanted heart  - Lipid panel  7. Aortic atherosclerosis ([place]) by Abd CT Scan in 2015  - Lipid panel  8. Medication management  - CBC with Differential/Platelet - COMPLETE METABOLIC PANEL WITH GFR - Magnesium - Lipid panel - TSH - Hemoglobin A1c - Insulin, random - VITAMIN D 25 Hydroxy          Discussed  regular exercise, BP monitoring, weight control to achieve/maintain BMI less than 25 and discussed med and SE's. Recommended labs to assess and monitor clinical status with further disposition pending results of labs.  I discussed the assessment and treatment plan with the patient. The patient was provided an opportunity to ask questions and all were answered. The patient agreed with the plan and demonstrated an understanding of the instructions.  I provided over 30 minutes of exam, counseling, chart review and  complex critical decision making.        The patient was advised to call back or seek an in-person evaluation if the symptoms worsen or if the condition fails to improve as anticipated.   Kirtland Bouchard, MD

## 2021-03-05 ENCOUNTER — Encounter: Payer: Self-pay | Admitting: Internal Medicine

## 2021-03-05 ENCOUNTER — Ambulatory Visit: Payer: Medicare Other | Admitting: Internal Medicine

## 2021-03-05 VITALS — BP 122/68 | HR 55 | Temp 97.3°F | Ht 69.0 in | Wt 203.6 lb

## 2021-03-05 DIAGNOSIS — E782 Mixed hyperlipidemia: Secondary | ICD-10-CM | POA: Diagnosis not present

## 2021-03-05 DIAGNOSIS — I480 Paroxysmal atrial fibrillation: Secondary | ICD-10-CM

## 2021-03-05 DIAGNOSIS — Z79899 Other long term (current) drug therapy: Secondary | ICD-10-CM

## 2021-03-05 DIAGNOSIS — I700 Atherosclerosis of aorta: Secondary | ICD-10-CM

## 2021-03-05 DIAGNOSIS — I10 Essential (primary) hypertension: Secondary | ICD-10-CM | POA: Diagnosis not present

## 2021-03-05 DIAGNOSIS — E559 Vitamin D deficiency, unspecified: Secondary | ICD-10-CM | POA: Diagnosis not present

## 2021-03-05 DIAGNOSIS — R7309 Other abnormal glucose: Secondary | ICD-10-CM

## 2021-03-05 DIAGNOSIS — I2510 Atherosclerotic heart disease of native coronary artery without angina pectoris: Secondary | ICD-10-CM

## 2021-03-05 NOTE — Patient Instructions (Signed)

## 2021-03-06 ENCOUNTER — Other Ambulatory Visit: Payer: Self-pay

## 2021-03-06 ENCOUNTER — Ambulatory Visit (HOSPITAL_BASED_OUTPATIENT_CLINIC_OR_DEPARTMENT_OTHER)
Admission: RE | Admit: 2021-03-06 | Discharge: 2021-03-06 | Disposition: A | Discharge status: Home / Self Care | Payer: Medicare Other | Attending: Urology | Admitting: Urology

## 2021-03-06 ENCOUNTER — Encounter (HOSPITAL_BASED_OUTPATIENT_CLINIC_OR_DEPARTMENT_OTHER): Payer: Self-pay | Admitting: Urology

## 2021-03-06 ENCOUNTER — Ambulatory Visit (HOSPITAL_BASED_OUTPATIENT_CLINIC_OR_DEPARTMENT_OTHER): Payer: Medicare Other | Admitting: Anesthesiology

## 2021-03-06 ENCOUNTER — Encounter (HOSPITAL_BASED_OUTPATIENT_CLINIC_OR_DEPARTMENT_OTHER)
Admission: RE | Disposition: A | Discharge status: Home / Self Care | Payer: Self-pay | Source: Home / Self Care | Attending: Urology

## 2021-03-06 DIAGNOSIS — Z955 Presence of coronary angioplasty implant and graft: Secondary | ICD-10-CM | POA: Diagnosis not present

## 2021-03-06 DIAGNOSIS — Z8551 Personal history of malignant neoplasm of bladder: Secondary | ICD-10-CM | POA: Diagnosis not present

## 2021-03-06 DIAGNOSIS — R310 Gross hematuria: Secondary | ICD-10-CM | POA: Insufficient documentation

## 2021-03-06 DIAGNOSIS — Z87891 Personal history of nicotine dependence: Secondary | ICD-10-CM | POA: Insufficient documentation

## 2021-03-06 DIAGNOSIS — Z79899 Other long term (current) drug therapy: Secondary | ICD-10-CM | POA: Diagnosis not present

## 2021-03-06 DIAGNOSIS — N132 Hydronephrosis with renal and ureteral calculous obstruction: Secondary | ICD-10-CM | POA: Insufficient documentation

## 2021-03-06 DIAGNOSIS — Z96643 Presence of artificial hip joint, bilateral: Secondary | ICD-10-CM | POA: Insufficient documentation

## 2021-03-06 HISTORY — PX: CYSTOSCOPY WITH RETROGRADE PYELOGRAM, URETEROSCOPY AND STENT PLACEMENT: SHX5789

## 2021-03-06 HISTORY — PX: HOLMIUM LASER APPLICATION: SHX5852

## 2021-03-06 LAB — POCT I-STAT, CHEM 8
BUN: 15 mg/dL (ref 8–23)
Calcium, Ion: 1.34 mmol/L (ref 1.15–1.40)
Chloride: 108 mmol/L (ref 98–111)
Creatinine, Ser: 0.8 mg/dL (ref 0.61–1.24)
Glucose, Bld: 101 mg/dL — ABNORMAL HIGH (ref 70–99)
HCT: 45 % (ref 39.0–52.0)
Hemoglobin: 15.3 g/dL (ref 13.0–17.0)
Potassium: 4.4 mmol/L (ref 3.5–5.1)
Sodium: 142 mmol/L (ref 135–145)
TCO2: 24 mmol/L (ref 22–32)

## 2021-03-06 LAB — CBC WITH DIFFERENTIAL/PLATELET
Absolute Monocytes: 646 cells/uL (ref 200–950)
Basophils Absolute: 57 cells/uL (ref 0–200)
Basophils Relative: 0.8 %
Eosinophils Absolute: 178 cells/uL (ref 15–500)
Eosinophils Relative: 2.5 %
HCT: 47.9 % (ref 38.5–50.0)
Hemoglobin: 16.5 g/dL (ref 13.2–17.1)
Lymphs Abs: 1548 cells/uL (ref 850–3900)
MCH: 31.1 pg (ref 27.0–33.0)
MCHC: 34.4 g/dL (ref 32.0–36.0)
MCV: 90.2 fL (ref 80.0–100.0)
MPV: 9.7 fL (ref 7.5–12.5)
Monocytes Relative: 9.1 %
Neutro Abs: 4672 cells/uL (ref 1500–7800)
Neutrophils Relative %: 65.8 %
Platelets: 226 10*3/uL (ref 140–400)
RBC: 5.31 10*6/uL (ref 4.20–5.80)
RDW: 12.4 % (ref 11.0–15.0)
Total Lymphocyte: 21.8 %
WBC: 7.1 10*3/uL (ref 3.8–10.8)

## 2021-03-06 LAB — LIPID PANEL
Cholesterol: 137 mg/dL (ref <200)
HDL: 64 mg/dL (ref >=40)
LDL Cholesterol (Calc): 57 mg/dL (calc)
Non-HDL Cholesterol (Calc): 73 mg/dL (calc) (ref <130)
Total CHOL/HDL Ratio: 2.1 (calc) (ref <5.0)
Triglycerides: 81 mg/dL (ref <150)

## 2021-03-06 LAB — HEMOGLOBIN A1C
Hgb A1c MFr Bld: 5.3 % of total Hgb (ref <5.7)
Mean Plasma Glucose: 105 mg/dL
eAG (mmol/L): 5.8 mmol/L

## 2021-03-06 LAB — COMPLETE METABOLIC PANEL WITH GFR
AG Ratio: 2 (calc) (ref 1.0–2.5)
ALT: 20 U/L (ref 9–46)
AST: 19 U/L (ref 10–35)
Albumin: 4.7 g/dL (ref 3.6–5.1)
Alkaline phosphatase (APISO): 56 U/L (ref 35–144)
BUN: 15 mg/dL (ref 7–25)
CO2: 30 mmol/L (ref 20–32)
Calcium: 9.8 mg/dL (ref 8.6–10.3)
Chloride: 106 mmol/L (ref 98–110)
Creat: 0.88 mg/dL (ref 0.70–1.28)
Globulin: 2.4 g/dL (calc) (ref 1.9–3.7)
Glucose, Bld: 89 mg/dL (ref 65–99)
Potassium: 4.4 mmol/L (ref 3.5–5.3)
Sodium: 142 mmol/L (ref 135–146)
Total Bilirubin: 2.3 mg/dL — ABNORMAL HIGH (ref 0.2–1.2)
Total Protein: 7.1 g/dL (ref 6.1–8.1)
eGFR: 90 mL/min/{1.73_m2} (ref >=60)

## 2021-03-06 LAB — MAGNESIUM: Magnesium: 2.2 mg/dL (ref 1.5–2.5)

## 2021-03-06 LAB — TSH: TSH: 0.69 mIU/L (ref 0.40–4.50)

## 2021-03-06 LAB — INSULIN, RANDOM: Insulin: 7.2 u[IU]/mL

## 2021-03-06 LAB — VITAMIN D 25 HYDROXY (VIT D DEFICIENCY, FRACTURES): Vit D, 25-Hydroxy: 94 ng/mL (ref 30–100)

## 2021-03-06 SURGERY — CYSTOURETEROSCOPY, WITH RETROGRADE PYELOGRAM AND STENT INSERTION
Anesthesia: General | Site: Ureter | Laterality: Left

## 2021-03-06 MED ORDER — DEXAMETHASONE SODIUM PHOSPHATE 10 MG/ML IJ SOLN
INTRAMUSCULAR | Status: AC
  Filled 2021-03-06: qty 1

## 2021-03-06 MED ORDER — OXYCODONE HCL 5 MG/5ML PO SOLN: 5.0000 mg | Freq: Once | ORAL | Status: DC | PRN

## 2021-03-06 MED ORDER — ONDANSETRON HCL 4 MG/2ML IJ SOLN
INTRAMUSCULAR | Status: DC | PRN
  Administered 2021-03-06: 4 mg via INTRAVENOUS

## 2021-03-06 MED ORDER — IOHEXOL 300 MG/ML  SOLN
INTRAMUSCULAR | Status: DC | PRN
  Administered 2021-03-06: 5 mL via URETHRAL
  Administered 2021-03-06: 10 mL via URETHRAL

## 2021-03-06 MED ORDER — MEPERIDINE HCL 25 MG/ML IJ SOLN: 6.2500 mg | INTRAMUSCULAR | Status: DC | PRN

## 2021-03-06 MED ORDER — LACTATED RINGERS IV SOLN: INTRAVENOUS | Status: DC

## 2021-03-06 MED ORDER — GENTAMICIN SULFATE 40 MG/ML IJ SOLN
5.0000 mg/kg | INTRAVENOUS | Status: AC
  Administered 2021-03-06: 390 mg via INTRAVENOUS
  Filled 2021-03-06: qty 9.75

## 2021-03-06 MED ORDER — LIDOCAINE HCL (PF) 2 % IJ SOLN
INTRAMUSCULAR | Status: DC | PRN
  Administered 2021-03-06: 60 mg via INTRADERMAL

## 2021-03-06 MED ORDER — MIDAZOLAM HCL 2 MG/2ML IJ SOLN: 0.5000 mg | Freq: Once | INTRAMUSCULAR | Status: DC | PRN

## 2021-03-06 MED ORDER — ACETAMINOPHEN 500 MG PO TABS
1000.0000 mg | ORAL_TABLET | Freq: Once | ORAL | Status: AC
  Administered 2021-03-06: 1000 mg via ORAL

## 2021-03-06 MED ORDER — DEXAMETHASONE SODIUM PHOSPHATE 10 MG/ML IJ SOLN
INTRAMUSCULAR | Status: DC | PRN
  Administered 2021-03-06: 6 mg via INTRAVENOUS

## 2021-03-06 MED ORDER — OXYCODONE HCL 5 MG PO TABS: 5.0000 mg | ORAL_TABLET | Freq: Once | ORAL | Status: DC | PRN

## 2021-03-06 MED ORDER — FENTANYL CITRATE (PF) 100 MCG/2ML IJ SOLN
INTRAMUSCULAR | Status: DC | PRN
  Administered 2021-03-06 (×2): 50 ug via INTRAVENOUS

## 2021-03-06 MED ORDER — LIDOCAINE HCL (PF) 2 % IJ SOLN
INTRAMUSCULAR | Status: AC
  Filled 2021-03-06: qty 5

## 2021-03-06 MED ORDER — ACETAMINOPHEN 500 MG PO TABS
ORAL_TABLET | ORAL | Status: AC
  Filled 2021-03-06: qty 2

## 2021-03-06 MED ORDER — SENNOSIDES-DOCUSATE SODIUM 8.6-50 MG PO TABS: 1.0000 | ORAL_TABLET | Freq: Two times a day (BID) | ORAL | 0 refills | Status: DC

## 2021-03-06 MED ORDER — PROMETHAZINE HCL 25 MG/ML IJ SOLN: 6.2500 mg | INTRAMUSCULAR | Status: DC | PRN

## 2021-03-06 MED ORDER — PROPOFOL 10 MG/ML IV BOLUS
INTRAVENOUS | Status: DC | PRN
  Administered 2021-03-06: 200 mg via INTRAVENOUS

## 2021-03-06 MED ORDER — PROPOFOL 10 MG/ML IV BOLUS
INTRAVENOUS | Status: AC
  Filled 2021-03-06: qty 40

## 2021-03-06 MED ORDER — ONDANSETRON HCL 4 MG/2ML IJ SOLN
INTRAMUSCULAR | Status: AC
  Filled 2021-03-06: qty 2

## 2021-03-06 MED ORDER — FENTANYL CITRATE (PF) 100 MCG/2ML IJ SOLN
INTRAMUSCULAR | Status: AC
  Filled 2021-03-06: qty 2

## 2021-03-06 MED ORDER — OXYCODONE-ACETAMINOPHEN 5-325 MG PO TABS: 1.0000 | ORAL_TABLET | ORAL | 0 refills | Status: DC | PRN

## 2021-03-06 MED ORDER — CEPHALEXIN 500 MG PO CAPS: 500.0000 mg | ORAL_CAPSULE | Freq: Two times a day (BID) | ORAL | 0 refills | Status: AC

## 2021-03-06 MED ORDER — KETOROLAC TROMETHAMINE 10 MG PO TABS: 10.0000 mg | ORAL_TABLET | Freq: Three times a day (TID) | ORAL | 0 refills | Status: DC | PRN

## 2021-03-06 MED ORDER — SODIUM CHLORIDE 0.9 % IR SOLN
Status: DC | PRN
  Administered 2021-03-06: 3000 mL

## 2021-03-06 MED ORDER — FENTANYL CITRATE (PF) 100 MCG/2ML IJ SOLN: 25.0000 ug | INTRAMUSCULAR | Status: DC | PRN

## 2021-03-06 SURGICAL SUPPLY — 28 items
BAG DRAIN URO-CYSTO SKYTR STRL (DRAIN) ×3 IMPLANT
BAG DRN UROCATH (DRAIN) ×2
BASKET LASER NITINOL 1.9FR (BASKET) ×3 IMPLANT
BSKT STON RTRVL 120 1.9FR (BASKET) ×2
CATH INTERMIT  6FR 70CM (CATHETERS) ×3 IMPLANT
CLOTH BEACON ORANGE TIMEOUT ST (SAFETY) ×3 IMPLANT
COVER DOME SNAP 22 D (MISCELLANEOUS) ×3 IMPLANT
DRSG TEGADERM 2-3/8X2-3/4 SM (GAUZE/BANDAGES/DRESSINGS) ×3 IMPLANT
GLOVE SURG ENC MOIS LTX SZ6 (GLOVE) ×3 IMPLANT
GLOVE SURG ENC MOIS LTX SZ7.5 (GLOVE) ×3 IMPLANT
GLOVE SURG UNDER POLY LF SZ6.5 (GLOVE) ×3 IMPLANT
GLOVE SURG UNDER POLY LF SZ7 (GLOVE) ×3 IMPLANT
GOWN STRL REUS W/TWL LRG LVL3 (GOWN DISPOSABLE) ×3 IMPLANT
GUIDEWIRE ANG ZIPWIRE 038X150 (WIRE) ×6 IMPLANT
GUIDEWIRE STR DUAL SENSOR (WIRE) ×6 IMPLANT
IV NS IRRIG 3000ML ARTHROMATIC (IV SOLUTION) ×3 IMPLANT
KIT TURNOVER CYSTO (KITS) ×3 IMPLANT
MANIFOLD NEPTUNE II (INSTRUMENTS) ×3 IMPLANT
NS IRRIG 500ML POUR BTL (IV SOLUTION) ×3 IMPLANT
PACK CYSTO (CUSTOM PROCEDURE TRAY) ×3 IMPLANT
SHEATH URETERAL 12FRX35CM (MISCELLANEOUS) ×3 IMPLANT
STENT POLARIS 5FRX26 (STENTS) ×3 IMPLANT
SYR 10ML LL (SYRINGE) ×3 IMPLANT
TRACTIP FLEXIVA PULS ID 200XHI (Laser) ×2 IMPLANT
TRACTIP FLEXIVA PULSE ID 200 (Laser) ×3
TUBE CONNECTING 12X1/4 (SUCTIONS) ×3 IMPLANT
TUBE FEEDING 8FR 16IN STR KANG (MISCELLANEOUS) ×3 IMPLANT
TUBING UROLOGY SET (TUBING) ×3 IMPLANT

## 2021-03-06 NOTE — Brief Op Note (Signed)
03/06/2021  11:56 AM  PATIENT:  Damon [name]  75 y.o. male  PRE-OPERATIVE DIAGNOSIS:  LEFT RENAL STONE, HEMATURIA  POST-OPERATIVE DIAGNOSIS:  LEFT RENAL STONE, HEMATURIA  PROCEDURE:  Procedure(s) with comments: CYSTOSCOPY WITH BILATERAL RETROGRADE PYELOGRAM, LEFT URETEROSCOPY AND STENT PLACEMENT - 1 HR HOLMIUM LASER APPLICATION (Left)  SURGEON:  Surgeon(s) and Role:    [name], MD - Primary  PHYSICIAN ASSISTANT:   ASSISTANTS: none   ANESTHESIA:   general  EBL:  5 mL   BLOOD ADMINISTERED:none  DRAINS: none   LOCAL MEDICATIONS USED:  NONE  SPECIMEN:  Source of Specimen:  Left renal stone fragments  DISPOSITION OF SPECIMEN:   [facility]  COUNTS:  YES  TOURNIQUET:  * No tourniquets in log *  DICTATION: .Other Dictation: Dictation Number [id number]  PLAN OF CARE: Discharge to home after PACU  PATIENT DISPOSITION:  PACU - hemodynamically stable.   Delay start of Pharmacological VTE agent (>24hrs) due to surgical blood loss or risk of bleeding: no

## 2021-03-06 NOTE — Op Note (Signed)
NAME: DAIMEON, WEISINGER MEDICAL RECORD NO: [id number] ACCOUNT NO: [id number] DATE OF BIRTH: March 23, 1946 FACILITY: [place] LOCATION: [facility] PHYSICIAN: [name], MD  Operative Report   DATE OF PROCEDURE: 03/06/2021  PREOPERATIVE DIAGNOSIS:  Left renal stone, hydronephrosis, history of bladder cancer.  PROCEDURE PERFORMED:   1.  Cystoscopy, bilateral retrograde pyelogram interpretation. 2.  Left ureteroscopy with laser lithotripsy. 3.  Insertion of left ureteral stent.  ESTIMATED BLOOD LOSS:  Nil.  COMPLICATIONS:  None.  SPECIMENS:  Left renal stone fragments for analysis.  FINDINGS:   1.  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 2.  Very mild left caliectasis on retrograde pyelogram. 3.  Unremarkable right retrograde pyelogram. 4.  Complete resolution of all accessible stone fragments larger than one-third mm following laser lithotripsy and basket extraction on the left side. 5.  Successful placement of left ureteral stent, proximal end in the renal pelvis, distal end in the urinary bladder.  INDICATIONS FOR PROCEDURE:  The patient is a pleasant 75 year old man with a history of superficial bladder cancer.  He is status post surgery for this years ago.  He has been fairly compliant with surveillance and is current on this.  He has had some  problematic gross hematuria on and off for several weeks.  CT imaging only revealed left UPJ area small stone with a questionable intermittent ball valving as a potential source.  Options for risk management including medical therapy versus shockwave  lithotripsy, retrograde ureteroscopy and he wished to proceed with the later with a goal of stone free and maximally rule out other sources of hematuria.  He is on blood thinners.  Informed consent was obtained and placed on the medical record.  PROCEDURE IN DETAIL:  The patient being verified, procedure being left ureteroscopic stone manipulation, bilateral retrogrades were  confirmed.   Procedure timeout was performed.  Intravenous antibiotics administered.  General LMA anesthesia induced.  The  patient was placed in the low lithotomy position.  A sterile field was created, prepped and draped the patient's penis, perineum and proximal thighs using iodine.  Cystourethroscopy was performed using 21-French rigid cystoscope with offset lens.   Inspection of anterior and posterior urethra was unremarkable.  The patient had a bladder with no diverticula, calcifications or papillary lesions whatsoever.  Bilateral orifices of the ureters were single.  The right ureteral orifice was cannulated with  a 6-French Foley catheter and right retrograde pyelogram was obtained.  Right retrograde pyelogram demonstrated a single right ureter, single system right kidney.  No filling defects were noted.  Similarly, left retrograde pyelogram was obtained.  Left retrograde pyelogram demonstrated a single left ureter, single system left kidney.  There is some mild caliectasis without the ureteral dilation, no obvious filling defects.  A 0.038 ZIPwire was then advance level of the upper pole side as a safety  wire.  An 8-French feeding tube placed in the urinary bladder for pressure release.  Semi-rigid ureteroscopy was performed.  The distal orifice of the left ureter alongside a separate sensor working wire.  No mucosal abnormalities were found.  A  semi-rigid scope was then exchanged for a 12/14 medium length ureteral access sheath to the level of the mid ureter using continuous fluoroscopic guidance and flexible digital ureteroscopy was performed in the proximal left ureter and systematic  inspection of the left kidney including all calyces x3.  There was a dominant freely mobile calcification within the renal pelvis.  No mucosal abnormalities were noted.  This was just too large  for simple basketing as such holmium laser energy was  applied to the stone using 0.2 joules and 20 Hz.  This  fragmented into approximately three smaller pieces.  They were then sequentially grasped with the long axis and removed and set aside for composition analysis.  Following this, complete resolution of  all accessible stone fragments larger than one-third mm.  No evidence of any perforation.  The access sheath was removed under continuous vision.  No significant mucosal abnormalities were found.  Given access sheath usage it was felt that brief  interval stenting with tethered stent will be most prudent, as such a new 5 x 26 Polaris type stent was placed over the safety wire using fluoroscopic guidance.  Good proximal and distal planes were noted.  Tether left in place and fashioned to the dorsum penis. Procedure was terminated.  The patient  tolerated the procedure well.  No immediate complications.  The patient taken to the postanesthesia care in stable condition.  Plan for discharge home after voiding.   PUS D: 03/06/2021 12:02:44 pm T: 03/06/2021 3:38:00 pm  JOB: [id number]

## 2021-03-06 NOTE — Anesthesia Procedure Notes (Signed)
Procedure Name: LMA Insertion Date/Time: 03/06/2021 11:24 AM Performed by: Lieutenant Diego, CRNA Pre-anesthesia Checklist: Patient identified, Emergency Drugs available, Suction available and Patient being monitored Patient Re-evaluated:Patient Re-evaluated prior to induction Oxygen Delivery Method: Circle system utilized Preoxygenation: Pre-oxygenation with 100% oxygen Induction Type: IV induction Ventilation: Mask ventilation without difficulty LMA: LMA inserted LMA Size: 5.0 Number of attempts: 1 Placement Confirmation: positive ETCO2 and breath sounds checked- equal and bilateral Tube secured with: Tape Dental Injury: Teeth and Oropharynx as per pre-operative assessment

## 2021-03-06 NOTE — Progress Notes (Signed)
============================================================ -   Test results slightly outside the reference range are not unusual. If there is anything important, I will review this with you,  otherwise it is considered normal test values.  If you have further questions,  please do not hesitate to contact me at the office or via My Chart.  ============================================================ ============================================================  -  Total Chol = 137   &    LDL Chol = 57 -- Both  Excellent   - Very low risk for Heart Attack  / Stroke ============================================================ ============================================================  -  Vitamin D = 94 - Excellent - Please keep dose same. ============================================================ ============================================================  -  All Else - CBC - Kidneys - Electrolytes - Liver - Magnesium & Thyroid    - all  Normal / OK ============================================================ ============================================================  -  Keep up the [place] Work  ! ============================================================ ============================================================

## 2021-03-06 NOTE — Discharge Instructions (Addendum)
1 - You may have urinary urgency (bladder spasms) and bloody urine on / off with stent in place. This is normal.  2 - Remove tethered stent on Friday morning at home by pulling on string, then blue-white plastic tubing, and discarding. Office is open Friday if any problems arise.   3 - Call MD or go to ER for fever >102, severe pain / nausea / vomiting not relieved by medications, or acute change in medical status    Post Anesthesia Home Care Instructions  Activity: Get plenty of rest for the remainder of the day. A responsible individual must stay with you for 24 hours following the procedure.  For the next 24 hours, DO NOT: -Drive a car -Paediatric nurse -Drink alcoholic beverages -Take any medication unless instructed by your physician -Make any legal decisions or sign important papers.  Meals: Start with liquid foods such as gelatin or soup. Progress to regular foods as tolerated. Avoid greasy, spicy, heavy foods. If nausea and/or vomiting occur, drink only clear liquids until the nausea and/or vomiting subsides. Call your physician if vomiting continues.  Special Instructions/Symptoms: Your throat may feel dry or sore from the anesthesia or the breathing tube placed in your throat during surgery. If this causes discomfort, gargle with warm salt water. The discomfort should disappear within 24 hours.  If you had a scopolamine patch placed behind your ear for the management of post- operative nausea and/or vomiting:  1. The medication in the patch is effective for 72 hours, after which it should be removed.  Wrap patch in a tissue and discard in the trash. Wash hands thoroughly with soap and water. 2. You may remove the patch earlier than 72 hours if you experience unpleasant side effects which may include dry mouth, dizziness or visual disturbances. 3. Avoid touching the patch. Wash your hands with soap and water after contact with the patch.

## 2021-03-06 NOTE — Anesthesia Preprocedure Evaluation (Addendum)
Anesthesia Evaluation  Patient identified by MRN, date of birth, ID band Patient awake    Reviewed: Allergy & Precautions, NPO status , Patient's Chart, lab work & pertinent test results, reviewed documented beta blocker date and time   History of Anesthesia Complications Negative for: history of anesthetic complications  Airway Mallampati: II  TM Distance: >3 FB Neck ROM: Full    Dental  (+) Dental Advisory Given   Pulmonary sleep apnea and Continuous Positive Airway Pressure Ventilation , former smoker,    breath sounds clear to auscultation       Cardiovascular hypertension, Pt. on medications and Pt. on home beta blockers (-) angina+ CAD (CAD status post rotational arthrectomy of the proximal circumflex followed by DES of the circumflex 2015 ) and + Peripheral Vascular Disease  + dysrhythmias (s/p ablation) Atrial Fibrillation + Valvular Problems/Murmurs MR  Rhythm:Regular Rate:Normal  '20 ECHO: EF 55-60%, normal LVF, mild MR, mild TR   Neuro/Psych negative neurological ROS     GI/Hepatic Neg liver ROS, GERD  Controlled,  Endo/Other  negative endocrine ROS  Renal/GU stone   Bladder cancer    Musculoskeletal   Abdominal   Peds  Hematology xarelto   Anesthesia Other Findings   Reproductive/Obstetrics                            Anesthesia Physical Anesthesia Plan  ASA: 3  Anesthesia Plan: General   Post-op Pain Management:    Induction: Intravenous  PONV Risk Score and Plan: 2 and Ondansetron and Dexamethasone  Airway Management Planned: LMA  Additional Equipment: None  Intra-op Plan:   Post-operative Plan:   Informed Consent: I have reviewed the patients History and Physical, chart, labs and discussed the procedure including the risks, benefits and alternatives for the proposed anesthesia with the patient or authorized representative who has indicated his/her understanding  and acceptance.     Dental advisory given  Plan Discussed with: CRNA and Surgeon  Anesthesia Plan Comments:        Anesthesia Quick Evaluation

## 2021-03-06 NOTE — H&P (Signed)
Damon [name] is an 76 y.o. male.    Chief Complaint: Pre-OP LEFT Ureteroscopic Stone Manipulation  HPI:   1 - Low Grade Bladder Cancer - Pt with left wall papillary tumor noted by Dr. Matilde [name] 03/2013 by office cysto on evaluation for gross hematuria. Remote 15PY smoker. 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    Recent History:  03/2013 - TaG1 with muscle in specimen by TURBT. Tumor 3cm lateral to left UO, not stented.  04/2019 cysto no recurrence  04/2020 cysto no recurrence  10/2020 cysto no recurrence   2 - Gross Hematuria - Pt with gross blood x several prompting GU eval 02/2013 and 10/2020. CT without obvious upper tract neoplasia, just small left UPJ stone w/o hydro.   3 -  Urolithiasis - 63mleft UPJ area stone on hematuria CT 2022, no hydro. No additional stones.   PMH sig for AFib/CAD/Stent (Xarelto for primary prevention, follows Dr. [name] ), TNA, Bilateral Hip replacement. No MI/CVA. [place] fre[place]unt[place] [facility] [facility] [place]   Today [name] seen to proceed with cysto, retrogrades, and LEFT ureteroscopic stone manipulation for left UPJ stone in setting of hematuria. NO interval fevers.   Past Medical History:  Diagnosis Date   Bladder cancer [place]    Hearing loss of both ears    History of colon polyps    BENIGN   Hyperlipidemia    Hypertension    Mitral regurgitation    OSA on CPAP    PAD (peripheral artery disease) (HCC)    ABI--  RIGHT SIDE NORMAL LEFT SIDE MODERATELY REDUCED W/ DISTAL LEFT SFA STENOSIS//  MILD CALDICATION   PAF (paroxysmal atrial fibrillation) ([place]     a. s/Damon PVI 2014; b. s/Damon redo PVI and CTI 01/2015   Vitamin D deficiency     Past Surgical History:  Procedure Laterality Date   ABLATION OF DYSRHYTHMIC FOCUS  01/26/2015   ATRIAL FIBRILLATION ABLATION N/A 07/27/2012   Procedure: ATRIAL FIBRILLATION ABLATION;  Surgeon: Damon Grayer MD;  [place];  Service: Cardiovascular;  Laterality:  N/A;   ATRIAL FIBRILLATION ABLATION N/A 08/25/2018   Procedure: ATRIAL FIBRILLATION ABLATION;  Surgeon: Damon Grayer MD;  [place];  Service: Cardiovascular;  Laterality: N/A;   CARDIAC ELECTROPHYSIOLOGY STUDY AND ABLATION  07-27-2012  DR Damon [name]  SUCCESSFUL ABLATION OF A-FIB   CARDIOVERSION  08/06/2012   Procedure: CARDIOVERSION;  Surgeon: Damon Records MD;  [place]  Service: Cardiovascular;  Laterality: N/A;   CARDIOVERSION N/A 08/25/2014   Procedure: CARDIOVERSION;  Surgeon: Damon Dresser MD;  [place] [facility]  Service: Cardiovascular;  Laterality: N/A;   CARDIOVERSION N/A 11/13/2014   Procedure: CARDIOVERSION;  Surgeon: Damon Margarita MD;  [place] [facility];  Service: Cardiovascular;  Laterality: N/A;   CARDIOVERSION N/A 12/28/2014   Procedure: CARDIOVERSION;  Surgeon: Damon Records MD;  [place]  Service: Cardiovascular;  Laterality: N/A;   CARDIOVERSION N/A 01/30/2017   Procedure: CARDIOVERSION;  Surgeon: Damon Hector MD;  [facility];  Service: Cardiovascular;  Laterality: N/A;   CARDIOVERSION N/A 04/06/2017   Procedure: CARDIOVERSION;  Surgeon: NDorothy Spark MD;  [facility];  Service: Cardiovascular;  Laterality: N/A;   CARDIOVERSION N/A 05/25/2018   Procedure: CARDIOVERSION;  Surgeon: Damon Latch MD;  [facility];  Service: Cardiovascular;  Laterality: N/A;   CARDIOVERSION N/A 08/09/2018   Procedure: CARDIOVERSION;  Surgeon: Damon Dresser MD;  [facility];  Service: Cardiovascular;  Laterality: N/A;   CARDIOVERSION N/A 07/04/2019   Procedure: CARDIOVERSION;  Surgeon: Damon Spark, MD;  [facility] END[facility]COPY;  Service: Cardiovascular;  Laterality: N/A;   CARDIOVERSION N/A 09/23/2019   Procedure: CARDIOVERSION;  Surgeon: Damon Spark, MD;  Location: [facility] ENDOSCOPY;  Service: Cardiovascular;  Laterality: N/A;   CATARACT EXTRACTION W/  INTRAOCULAR LENS  IMPLANT, BILATERAL     COLONOSCOPY WITH PROPOFOL N/A 02/13/2015   Procedure: COLONOSCOPY WITH PROPOFOL;  Surgeon: Damon Fair, MD;  Location: [facility];  Service: Endoscopy;  Laterality: N/A;   CYSTOSCOPY W/ RETROGRADES Bilateral 04/12/2013   Procedure: CYSTOSCOPY WITH RETROGRADE PYELOGRAM;  Surgeon: Damon Frock, MD;  Location: [facility];  Service: Urology;  Laterality: Bilateral;   ELECTROPHYSIOLOGIC STUDY N/A 01/26/2015   Procedure: Atrial Fibrillation Ablation;  Surgeon: Damon Grayer, MD;  Location: [place] CV LAB;  Service: Cardiovascular;  Laterality: N/A;   JOINT REPLACEMENT     LEFT HEART CATHETERIZATION WITH CORONARY ANGIOGRAM N/A 11/24/2013   Procedure: LEFT HEART CATHETERIZATION WITH CORONARY ANGIOGRAM;  Surgeon: Damon Blanks, MD;  Location: [facility] CATH LAB;  Service: Cardiovascular;  Laterality: N/A;   PERCUTANEOUS CORONARY ROTOBLATOR INTERVENTION (PCI-R) N/A 11/28/2013   Procedure: PERCUTANEOUS CORONARY ROTOBLATOR INTERVENTION (PCI-R);  Surgeon: Damon Blanks, MD;  [facility];  Service: Cardiovascular;  Laterality: N/A;   PERCUTANEOUS CORONARY STENT INTERVENTION (PCI-S)  11/24/2013   Procedure: PERCUTANEOUS CORONARY STENT INTERVENTION (PCI-S);  Surgeon: Damon Blanks, MD;  Location: [facility] CATH LAB;  Service: Cardiovascular;;   TEE WITHOUT CARDIOVERSION  07/26/2012   Procedure: TRANSESOPHAGEAL ECHOCARDIOGRAM (TEE);  Surgeon: [name], MD;  Location: [facility] END[facility]COPY;  Service: Cardiovascular;  Laterality: N/A;   TEE WITHOUT CARDIOVERSION N/A 08/25/2014   Procedure: TRANSESOPHAGEAL ECHOCARDIOGRAM (TEE);  Surgeon: Damon Dresser, MD;  Location: [place];  Service: Cardiovascular;  Laterality: N/A;   TEE WITHOUT CARDIOVERSION  01/26/2015   Procedure: Transesophageal Echocardiogram (Tee);  Surgeon: Damon Headings, MD;  Location: [place] CV LAB;  Service: Cardiovascular;;   TEE WITHOUT CARDIOVERSION  N/A 07/04/2019   Procedure: TRANSESOPHAGEAL ECHOCARDIOGRAM (TEE);  Surgeon: Damon Spark, MD;  Location: [facility] ENDOSCOPY;  Service: Cardiovascular;  Laterality: N/A;   TONSILLECTOMY  age 3   TOTAL HIP ARTHROPLASTY  07/25/2011   Procedure: TOTAL HIP ARTHROPLASTY ANTERIOR APPROACH;  Surgeon: Mcarthur Rossetti;  [facility];  Service: Orthopedics;  Laterality: Right;   TOTAL HIP ARTHROPLASTY Left 02/08/2010   TRANSURETHRAL RESECTION OF BLADDER TUMOR WITH GYRUS (TURBT-GYRUS) N/A 04/12/2013   Procedure: TRANSURETHRAL RESECTION OF BLADDER TUMOR WITH GYRUS (TURBT-GYRUS);  Surgeon: Damon Frock, MD;  [facility];  Service: Urology;  Laterality: N/A;    Family History  Problem Relation Age of Onset   Heart attack Mother    Heart disease Mother    Heart failure Father    Heart disease Father    Social History:  reports that he quit smoking about 39 years ago. His smoking use included cigarettes. He has a 20.00 pack-year smoking history. He has never used smokeless tobacco. He reports that he does not drink alcohol and does not use drugs.  Allergies: No Known Allergies  No medications prior to admission.    Results for orders placed or performed in visit on 03/05/21 (from the past 48 hour(s))  CBC with Differential/Platelet     Status: None   Collection Time: 03/05/21 11:25 AM  Result Value Ref Range   WBC 7.1 3.8 -  10.8 Thousand/uL   RBC 5.31 4.20 - 5.80 Million/uL   Hemoglobin 16.5 13.2 - 17.1 g/dL   HCT 47.9 38.5 - 50.0 %   MCV 90.2 80.0 - 100.0 fL   MCH 31.1 27.0 - 33.0 pg   MCHC 34.4 32.0 - 36.0 g/dL   RDW 12.4 11.0 - 15.0 %   Platelets 226 140 - 400 Thousand/uL   MPV 9.7 7.5 - 12.5 fL   Neutro Abs 4,672 1,500 - 7,800 cells/uL   Lymphs Abs 1,548 850 - 3,900 cells/uL   Absolute Monocytes 646 200 - 950 cells/uL   Eosinophils Absolute 178 15 - 500 cells/uL   Basophils Absolute 57 0 - 200 cells/uL   Neutrophils Relative % 65.8 %   Total  Lymphocyte 21.8 %   Monocytes Relative 9.1 %   Eosinophils Relative 2.5 %   Basophils Relative 0.8 %  COMPLETE METABOLIC PANEL WITH GFR     Status: Abnormal   Collection Time: 03/05/21 11:25 AM  Result Value Ref Range   Glucose, Bld 89 65 - 99 mg/dL    Comment: .            Fasting reference interval .    BUN 15 7 - 25 mg/dL   Creat 0.88 0.70 - 1.28 mg/dL   eGFR 90 > OR = 60 mL/min/1.43m    Comment: The eGFR is based on the CKD-EPI 2021 equation. To calculate  the new eGFR from a previous Creatinine or Cystatin C result, go to https://www.kidney.org/professionals/ kdoqi/gfr%5Fcalculator    BUN/Creatinine Ratio NOT APPLICABLE 6 - 22 (calc)   Sodium 142 135 - 146 mmol/L   Potassium 4.4 3.5 - 5.3 mmol/L   Chloride 106 98 - 110 mmol/L   CO2 30 20 - 32 mmol/L   Calcium 9.8 8.6 - 10.3 mg/dL   Total Protein 7.1 6.1 - 8.1 g/dL   Albumin 4.7 3.6 - 5.1 g/dL   Globulin 2.4 1.9 - 3.7 g/dL (calc)   AG Ratio 2.0 1.0 - 2.5 (calc)   Total Bilirubin 2.3 (H) 0.2 - 1.2 mg/dL   Alkaline phosphatase (APISO) 56 35 - 144 U/L   AST 19 10 - 35 U/L   ALT 20 9 - 46 U/L  Magnesium     Status: None   Collection Time: 03/05/21 11:25 AM  Result Value Ref Range   Magnesium 2.2 1.5 - 2.5 mg/dL  Lipid panel     Status: None   Collection Time: 03/05/21 11:25 AM  Result Value Ref Range   Cholesterol 137 <200 mg/dL   HDL 64 > OR = 40 mg/dL   Triglycerides 81 <150 mg/dL   LDL Cholesterol (Calc) 57 mg/dL (calc)    Comment: Reference range: <100 . Desirable range <100 mg/dL for primary prevention;   <70 mg/dL for patients with CHD or diabetic patients  with > or = 2 CHD risk factors. .Marland KitchenLDL-C is now calculated using the Martin-Hopkins  calculation, which is a validated novel method providing  better accuracy than the Friedewald equation in the  estimation of LDL-C.  MCresenciano Genreet al. JAnnamaria Helling [id number];941(74: [id number]  (http://education.[facility].com/faq/FAQ164)    Total CHOL/HDL Ratio 2.1 <5.0  (calc)   Non-HDL Cholesterol (Calc) 73 <130 mg/dL (calc)    Comment: For patients with diabetes plus 1 major ASCVD risk  factor, treating to a non-HDL-C goal of <100 mg/dL  (LDL-C of <70 mg/dL) is considered a therapeutic  option.   TSH     Status: None  Collection Time: 03/05/21 11:25 AM  Result Value Ref Range   TSH 0.69 0.40 - 4.50 mIU/L  Hemoglobin A1c     Status: None   Collection Time: 03/05/21 11:25 AM  Result Value Ref Range   Hgb A1c MFr Bld 5.3 <5.7 % of total Hgb    Comment: For the purpose of screening for the presence of diabetes: . <5.7%       Consistent with the absence of diabetes 5.7-6.4%    Consistent with increased risk for diabetes             (prediabetes) > or =6.5%  Consistent with diabetes . This assay result is consistent with a decreased risk of diabetes. . Currently, no consensus exists regarding use of hemoglobin A1c for diagnosis of diabetes in children. . According to American Diabetes Association (ADA) guidelines, hemoglobin A1c <7.0% represents optimal control in non-pregnant diabetic patients. Different metrics may apply to specific patient populations.  Standards of Medical Care in Diabetes(ADA). .    Mean Plasma Glucose 105 mg/dL   eAG (mmol/L) 5.8 mmol/L  VITAMIN D 25 Hydroxy (Vit-D Deficiency, Fractures)     Status: None   Collection Time: 03/05/21 11:25 AM  Result Value Ref Range   Vit D, 25-Hydroxy 94 30 - 100 ng/mL    Comment: Vitamin D Status         25-OH Vitamin D: . Deficiency:                    <20 ng/mL Insufficiency:             20 - 29 ng/mL Optimal:                 > or = 30 ng/mL . For 25-OH Vitamin D testing on patients on  D2-supplementation and patients for whom quantitation  of D2 and D3 fractions is required, the QuestAssureD(TM) 25-OH VIT D, (D2,D3), LC/MS/MS is recommended: order  code [phone] (patients >21yr). See Note 1 . Note 1 . For additional information, please refer to   http://education.[facility].com/faq/FAQ199  (This link is being provided for informational/ educational purposes only.)    No results found.  Review of Systems  Constitutional: Negative.  Negative for chills and fever.  Genitourinary:  Positive for hematuria.   Height _0  (1.753 m), weight 90.7 kg. Physical Exam Vitals reviewed.  HENT:     Head: Normocephalic.     Nose: Nose normal.  Eyes:     Pupils: Pupils are equal, round, and reactive to light.  Cardiovascular:     Rate and Rhythm: Normal rate.  Pulmonary:     Effort: Pulmonary effort is normal.  Abdominal:     General: Abdomen is flat.  Genitourinary:    Comments: No CVAT at present. Musculoskeletal:        General: Normal range of motion.     Cervical back: Normal range of motion.  Skin:    General: Skin is warm.  Neurological:     General: No focal deficit present.     Mental Status: He is alert.  Psychiatric:        Mood and Affect: Mood normal.     Assessment/Plan  Proceed as planned with cysto, bilateral retrogrades ( to r/o neoplasia ) and LEFT ureteroscopic stone manipulation with goal of maximally r/o worriseom cuases of hematuria and render stone free. Risks, benefits, alternatives, expected peri-op course discussed previously and reiterated today.   TAlexis Frock MD 03/06/2021, 8:35 AM

## 2021-03-06 NOTE — Anesthesia Postprocedure Evaluation (Signed)
Anesthesia Post Note  Patient: DINH SAWICKI  Procedure(s) Performed: CYSTOSCOPY WITH BILATERAL RETROGRADE PYELOGRAM, LEFT URETEROSCOPY AND STENT PLACEMENT (Ureter) HOLMIUM LASER APPLICATION (Left: Ureter)     Patient location during evaluation: PACU Anesthesia Type: General Level of consciousness: awake and alert Pain management: pain level controlled Vital Signs Assessment: post-procedure vital signs reviewed and stable Respiratory status: spontaneous breathing, nonlabored ventilation and respiratory function stable Cardiovascular status: blood pressure returned to baseline and stable Postop Assessment: no apparent nausea or vomiting Anesthetic complications: no   No notable events documented.  Last Vitals:  Vitals:   03/06/21 1215 03/06/21 1230  BP: 123/68 124/73  Pulse: 60 (!) 57  Resp: 18 12  Temp:    SpO2: 93% 96%    Last Pain:  Vitals:   03/06/21 1230  TempSrc:   PainSc: 4                  Kajuan Guyton,E. Cheyenna Pankowski

## 2021-03-06 NOTE — Transfer of Care (Signed)
Immediate Anesthesia Transfer of Care Note  Patient: Damon [name]  Procedure(s) Performed: CYSTOSCOPY WITH BILATERAL RETROGRADE PYELOGRAM, LEFT URETEROSCOPY AND STENT PLACEMENT (Ureter) HOLMIUM LASER APPLICATION (Left: Ureter)  Patient Location: PACU  Anesthesia Type:General  Level of Consciousness: drowsy  Airway & Oxygen Therapy: Patient Spontanous Breathing and Patient connected to face mask oxygen  Post-op Assessment: Report given to RN and Post -op Vital signs reviewed and stable  Post vital signs: Reviewed and stable  Last Vitals:  Vitals Value Taken Time  BP    Temp    Pulse 64 03/06/21 1207  Resp 17 03/06/21 1207  SpO2 98 % 03/06/21 1207  Vitals shown include unvalidated device data.  Last Pain:  Vitals:   03/06/21 0958  TempSrc: Oral  PainSc: 0-No pain      Patients Stated Pain Goal: 5 ([phone] [id number])  Complications: No notable events documented.

## 2021-03-07 ENCOUNTER — Encounter (HOSPITAL_BASED_OUTPATIENT_CLINIC_OR_DEPARTMENT_OTHER): Payer: Self-pay | Admitting: Urology

## 2021-03-08 ENCOUNTER — Telehealth: Payer: Self-pay | Admitting: Internal Medicine

## 2021-03-08 NOTE — Telephone Encounter (Signed)
Pts call put through to me but the pt thought he was calling Dr. Zettie Pho office his Urologist... he had a kidney stone earlier this week and was advised to remove the "stent" and he did this morning but now having vomiting, left lower abdominal pain near his groin and some bleeding... I gave him the proper number and advised them to stay on the line since it is after hours but they must have an MD on call. I also told them that he may need to consider going to the ED if they cannot talk to someone and they agreed.

## 2021-03-08 NOTE — Telephone Encounter (Signed)
  Pt c/o of Chest Pain: STAT if CP now or developed within 24 hours  1. Are you having CP right now? Yes   2. Are you experiencing any other symptoms (ex. SOB, nausea, vomiting, sweating)? Vomit  3. How long have you been experiencing CP? Few minutes   4. Is your CP continuous or coming and going? coming and going  5. Have you taken Nitroglycerin? No    ?

## 2021-04-06 NOTE — Progress Notes (Signed)
PCP:  [name], MD Primary Cardiologist: Lauree Chandler, MD Electrophysiologist: [name], MD   Damon [name] is a 75 y.o. male seen today for [name], MD for routine electrophysiology followup.  Since last being seen in our clinic the patient reports doing very well. He had paroxysm of AF a couple months after drinking very cold water. Last for 1-2 days where he noticed his heart racing at times and mild fatigue. Otherwise, he denies chest pain, dyspnea, PND, orthopnea, nausea, vomiting, dizziness, syncope, edema, weight gain, or early satiety. No bleeding on Xarelto.  Past Medical History:  Diagnosis Date   Bladder cancer ([place])    Hearing loss of both ears    History of colon polyps    BENIGN   Hyperlipidemia    Hypertension    Mitral regurgitation    OSA on CPAP    PAD (peripheral artery disease) (HCC)    ABI--  RIGHT SIDE NORMAL LEFT SIDE MODERATELY REDUCED W/ DISTAL LEFT SFA STENOSIS//  MILD CALDICATION   PAF (paroxysmal atrial fibrillation) ([place])     a. s/p PVI 2014; b. s/p redo PVI and CTI 01/2015   Vitamin D deficiency    Past Surgical History:  Procedure Laterality Date   ABLATION OF DYSRHYTHMIC FOCUS  01/26/2015   ATRIAL FIBRILLATION ABLATION N/A 07/27/2012   Procedure: ATRIAL FIBRILLATION ABLATION;  Surgeon: [name], MD;  Location: [place] CATH LAB;  Service: Cardiovascular;  Laterality: N/A;   ATRIAL FIBRILLATION ABLATION N/A 08/25/2018   Procedure: ATRIAL FIBRILLATION ABLATION;  Surgeon: [name], MD;  Location: [place] CV LAB;  Service: Cardiovascular;  Laterality: N/A;   CARDIAC ELECTROPHYSIOLOGY STUDY AND ABLATION  07-27-2012  DR [name]   SUCCESSFUL ABLATION OF A-FIB   CARDIOVERSION  08/06/2012   Procedure: CARDIOVERSION;  Surgeon: [name], MD;  Location: [place];  Service: Cardiovascular;  Laterality: N/A;   CARDIOVERSION N/A 08/25/2014   Procedure: CARDIOVERSION;  Surgeon: Larey Dresser, MD;  Location: [place];   Service: Cardiovascular;  Laterality: N/A;   CARDIOVERSION N/A 11/13/2014   Procedure: CARDIOVERSION;  Surgeon: Sueanne Margarita, MD;  Location: [place];  Service: Cardiovascular;  Laterality: N/A;   CARDIOVERSION N/A 12/28/2014   Procedure: CARDIOVERSION;  Surgeon: [name], MD;  Location: [place];  Service: Cardiovascular;  Laterality: N/A;   CARDIOVERSION N/A 01/30/2017   Procedure: CARDIOVERSION;  Surgeon: Josue Hector, MD;  Location: [facility] ENDOSCOPY;  Service: Cardiovascular;  Laterality: N/A;   CARDIOVERSION N/A 04/06/2017   Procedure: CARDIOVERSION;  Surgeon: Dorothy Spark, MD;  Location: [facility] ENDOSCOPY;  Service: Cardiovascular;  Laterality: N/A;   CARDIOVERSION N/A 05/25/2018   Procedure: CARDIOVERSION;  Surgeon: Skeet Latch, MD;  Location: [place];  Service: Cardiovascular;  Laterality: N/A;   CARDIOVERSION N/A 08/09/2018   Procedure: CARDIOVERSION;  Surgeon: Buford Dresser, MD;  Location: [facility] ENDOSCOPY;  Service: Cardiovascular;  Laterality: N/A;   CARDIOVERSION N/A 07/04/2019   Procedure: CARDIOVERSION;  Surgeon: Dorothy Spark, MD;  Location: [facility] ENDOSCOPY;  Service: Cardiovascular;  Laterality: N/A;   CARDIOVERSION N/A 09/23/2019   Procedure: CARDIOVERSION;  Surgeon: Dorothy Spark, MD;  Location: [place];  Service: Cardiovascular;  Laterality: N/A;   CATARACT EXTRACTION W/ INTRAOCULAR LENS  IMPLANT, BILATERAL     COLONOSCOPY WITH PROPOFOL N/A 02/13/2015   Procedure: COLONOSCOPY WITH PROPOFOL;  Surgeon: Garlan Fair, MD;  Location: [facility] ENDOSCOPY;  Service: Endoscopy;  Laterality: N/A;   CYSTOSCOPY W/ RETROGRADES Bilateral 04/12/2013   Procedure: CYSTOSCOPY WITH  RETROGRADE PYELOGRAM;  Surgeon: [name], MD;  Location: [facility];  Service: Urology;  Laterality: Bilateral;   CYSTOSCOPY WITH RETROGRADE PYELOGRAM, URETEROSCOPY AND STENT PLACEMENT  03/06/2021   Procedure: CYSTOSCOPY WITH BILATERAL RETROGRADE  PYELOGRAM, LEFT URETEROSCOPY AND STENT PLACEMENT;  Surgeon: [name], MD;  Location: [facility];  Service: Urology;;  1 HR   ELECTROPHYSIOLOGIC STUDY N/A 01/26/2015   Procedure: Atrial Fibrillation Ablation;  Surgeon: [name], MD;  Location: [place] CV LAB;  Service: Cardiovascular;  Laterality: N/A;   HOLMIUM LASER APPLICATION Left [id number]   Procedure: HOLMIUM LASER APPLICATION;  Surgeon: [name], MD;  Location: [facility];  Service: Urology;  Laterality: Left;   JOINT REPLACEMENT     LEFT HEART CATHETERIZATION WITH CORONARY ANGIOGRAM N/A 11/24/2013   Procedure: LEFT HEART CATHETERIZATION WITH CORONARY ANGIOGRAM;  Surgeon: [name], MD;  Location: [facility] CATH LAB;  Service: Cardiovascular;  Laterality: N/A;   PERCUTANEOUS CORONARY ROTOBLATOR INTERVENTION (PCI-R) N/A 11/28/2013   Procedure: PERCUTANEOUS CORONARY ROTOBLATOR INTERVENTION (PCI-R);  Surgeon: [name], MD;  Location: [facility] CATH LAB;  Service: Cardiovascular;  Laterality: N/A;   PERCUTANEOUS CORONARY STENT INTERVENTION (PCI-S)  11/24/2013   Procedure: PERCUTANEOUS CORONARY STENT INTERVENTION (PCI-S);  Surgeon: [name], MD;  Location: [facility] CATH LAB;  Service: Cardiovascular;;   TEE WITHOUT CARDIOVERSION  07/26/2012   Procedure: TRANSESOPHAGEAL ECHOCARDIOGRAM (TEE);  Surgeon: [name], MD;  Location: [facility] ENDOSCOPY;  Service: Cardiovascular;  Laterality: N/A;   TEE WITHOUT CARDIOVERSION N/A 08/25/2014   Procedure: TRANSESOPHAGEAL ECHOCARDIOGRAM (TEE);  Surgeon: Larey Dresser, MD;  Location: [place];  Service: Cardiovascular;  Laterality: N/A;   TEE WITHOUT CARDIOVERSION  01/26/2015   Procedure: Transesophageal Echocardiogram (Tee);  Surgeon: [name], MD;  Location: [place] CV LAB;  Service: Cardiovascular;;   TEE WITHOUT CARDIOVERSION N/A 07/04/2019   Procedure: TRANSESOPHAGEAL ECHOCARDIOGRAM (TEE);  Surgeon: Dorothy Spark, MD;  Location: [facility] ENDOSCOPY;  Service: Cardiovascular;  Laterality: N/A;   TONSILLECTOMY  age 40   TOTAL HIP ARTHROPLASTY  07/25/2011   Procedure: TOTAL HIP ARTHROPLASTY ANTERIOR APPROACH;  Surgeon: Mcarthur Rossetti;  [facility];  Service: Orthopedics;  Laterality: Right;   TOTAL HIP ARTHROPLASTY Left 02/08/2010   TRANSURETHRAL RESECTION OF BLADDER TUMOR WITH GYRUS (TURBT-GYRUS) N/A 04/12/2013   Procedure: TRANSURETHRAL RESECTION OF BLADDER TUMOR WITH GYRUS (TURBT-GYRUS);  Surgeon: [name], MD;  Location: [facility];  Service: Urology;  Laterality: N/A;    Current Outpatient Medications  Medication Sig Dispense Refill   bisoprolol-hydrochlorothiazide (ZIAC) 10-6.25 MG tablet Take 1 tablet Daily for BP 90 tablet 3   Cholecalciferol (VITAMIN D3) 125 MCG (5000 UT) CAPS Take 10,000 units Daily 30 capsule    diltiazem (CARDIZEM CD) 180 MG 24 hr capsule Take 1 capsule by mouth once daily 90 capsule 0   ezetimibe (ZETIA) 10 MG tablet TAKE 1 TABLET BY MOUTH ONCE DAILY FOR CHOLESTEROL 90 tablet 2   Multiple Vitamin (MULTIVITAMIN WITH MINERALS) TABS tablet Take 1 tablet Daily     nitroGLYCERIN (NITROSTAT) 0.4 MG SL tablet DISSOLVE ONE TABLET UNDER THE TONGUE EVERY 5 MINUTES AS NEEDED FOR CHEST PAIN.DO NOT EXCEED A TOTAL OF 3 DOSES IN 15 MINUTES 25 tablet 6   Polyvinyl Alcohol-Povidone (REFRESH OP) Place 1 drop into both eyes daily as needed (dry eyes).     rosuvastatin (CRESTOR) 40 MG tablet Take 1/2 tablet Daily for Cholesterol 45 tablet 3   senna-docusate (SENOKOT-S) 8.6-50  MG tablet Take 1 tablet by mouth 2 (two) times daily. While taking strongest pain meds to prevent prevent constipation 10 tablet 0   XARELTO 20 MG TABS tablet TAKE 1 TABLET BY MOUTH ONCE DAILY WITH SUPPER 90 tablet 2   No current facility-administered medications for this visit.    No Known Allergies  Social History   Socioeconomic History   Marital status: Married    Spouse name: Not  on file   Number of children: Not on file   Years of education: Not on file   Highest education level: Not on file  Occupational History   Not on file  Tobacco Use   Smoking status: Former    Packs/day: 2.00    Years: 10.00    Pack years: 20.00    Types: Cigarettes    Quit date: 09/20/1981    Years since quitting: 39.5   Smokeless tobacco: Never  Vaping Use   Vaping Use: Never used  Substance and Sexual Activity   Alcohol use: No    Comment: pt states he has stopped drinking alcohol   Drug use: No   Sexual activity: Not on file  Other Topics Concern   Not on file  Social History Narrative   Not on file   Social Determinants of Health   Financial Resource Strain: Not on file  Food Insecurity: Not on file  Transportation Needs: Not on file  Physical Activity: Not on file  Stress: Not on file  Social Connections: Not on file  Intimate Partner Violence: Not on file     Review of Systems: All other systems reviewed and are otherwise negative except as noted above.  Physical Exam: Vitals:   04/08/21 0924  BP: 128/62  Pulse: 60  SpO2: 95%  Weight: 205 lb (93 kg)  Height: '5\' 9"'$  (1.753 m)    GEN- The patient is well appearing, alert and oriented x 3 today.   HEENT: normocephalic, atraumatic; sclera clear, conjunctiva pink; hearing intact; oropharynx clear; neck supple, no JVP Lymph- no cervical lymphadenopathy Lungs- Clear to ausculation bilaterally, normal work of breathing.  No wheezes, rales, rhonchi Heart- Regular rate and rhythm, no murmurs, rubs or gallops, PMI not laterally displaced GI- soft, non-tender, non-distended, bowel sounds present, no hepatosplenomegaly Extremities- no clubbing, cyanosis, or edema; DP/PT/radial pulses 2+ bilaterally MS- no significant deformity or atrophy Skin- warm and dry, no rash or lesion Psych- euthymic mood, full affect Neuro- strength and sensation are intact  EKG is ordered. Personal review of EKG from today shows NSR at  60 bpm  Additional studies reviewed include: Previous EP office notes.   Assessment and Plan:  1. Persistent atrial fibrillation Overall doing well post ablation Continue Xarelto for CHA2DS2-VASc of at least 3 EKG today shows NSR Labs per PCP last month stable.   2. HTN Stable on current regimen  3. CAD Denies s/s ischemia  4. OSA Encouraged nightly CPAP  RTC 6 months. Sooner with more breakthrough AF.  Shirley Friar, PA-C  04/08/21 9:30 AM

## 2021-04-08 ENCOUNTER — Ambulatory Visit (INDEPENDENT_AMBULATORY_CARE_PROVIDER_SITE_OTHER): Payer: Medicare Other | Admitting: Student

## 2021-04-08 ENCOUNTER — Other Ambulatory Visit: Payer: Self-pay

## 2021-04-08 ENCOUNTER — Ambulatory Visit: Payer: Medicare Other | Admitting: Physician Assistant

## 2021-04-08 ENCOUNTER — Encounter: Payer: Self-pay | Admitting: Student

## 2021-04-08 VITALS — BP 128/62 | HR 60 | Ht 69.0 in | Wt 205.0 lb

## 2021-04-08 DIAGNOSIS — G4733 Obstructive sleep apnea (adult) (pediatric): Secondary | ICD-10-CM | POA: Diagnosis not present

## 2021-04-08 DIAGNOSIS — I10 Essential (primary) hypertension: Secondary | ICD-10-CM

## 2021-04-08 DIAGNOSIS — I2510 Atherosclerotic heart disease of native coronary artery without angina pectoris: Secondary | ICD-10-CM | POA: Diagnosis not present

## 2021-04-08 DIAGNOSIS — I4819 Other persistent atrial fibrillation: Secondary | ICD-10-CM | POA: Diagnosis not present

## 2021-04-12 ENCOUNTER — Other Ambulatory Visit: Payer: Self-pay | Admitting: Internal Medicine

## 2021-04-12 DIAGNOSIS — I480 Paroxysmal atrial fibrillation: Secondary | ICD-10-CM

## 2021-04-12 DIAGNOSIS — I2510 Atherosclerotic heart disease of native coronary artery without angina pectoris: Secondary | ICD-10-CM

## 2021-04-15 NOTE — Telephone Encounter (Signed)
Pt last saw Oda Kilts, [place] on 04/08/21, last labs 03/05/21 Creat 0.88, age 75, weight 93kg, CrCl 95.41, based on CrCl pt is on appropriate dosage of Xarelto 20mg  QD for afib.  Will refill rx.

## 2021-04-22 ENCOUNTER — Ambulatory Visit: Payer: Medicare Other | Admitting: Internal Medicine

## 2021-04-30 ENCOUNTER — Other Ambulatory Visit: Payer: Self-pay | Admitting: Cardiovascular Disease

## 2021-04-30 DIAGNOSIS — I2510 Atherosclerotic heart disease of native coronary artery without angina pectoris: Secondary | ICD-10-CM

## 2021-04-30 DIAGNOSIS — E782 Mixed hyperlipidemia: Secondary | ICD-10-CM

## 2021-05-06 ENCOUNTER — Telehealth: Payer: Self-pay

## 2021-05-06 NOTE — Telephone Encounter (Signed)
Faxed note to number below

## 2021-05-06 NOTE — Telephone Encounter (Signed)
Baxter Flattery with Dr. Lissa Hoard [facility]'s dental office would like a note/letter sent to there office concerning pre-medication before dental procedure.  Cb# [phone].  Please advise.

## 2021-05-20 ENCOUNTER — Ambulatory Visit: Payer: Medicare Other | Admitting: Adult Health

## 2021-05-24 ENCOUNTER — Encounter: Payer: Self-pay | Admitting: Internal Medicine

## 2021-05-28 ENCOUNTER — Ambulatory Visit (INDEPENDENT_AMBULATORY_CARE_PROVIDER_SITE_OTHER): Payer: Medicare Other

## 2021-05-28 ENCOUNTER — Other Ambulatory Visit: Payer: Self-pay

## 2021-05-28 VITALS — Temp 97.9°F

## 2021-05-28 DIAGNOSIS — Z23 Encounter for immunization: Secondary | ICD-10-CM | POA: Diagnosis not present

## 2021-06-09 ENCOUNTER — Other Ambulatory Visit: Payer: Self-pay | Admitting: Internal Medicine

## 2021-06-09 DIAGNOSIS — I10 Essential (primary) hypertension: Secondary | ICD-10-CM

## 2021-06-09 DIAGNOSIS — I480 Paroxysmal atrial fibrillation: Secondary | ICD-10-CM

## 2021-07-23 ENCOUNTER — Other Ambulatory Visit (HOSPITAL_COMMUNITY): Payer: Self-pay | Admitting: *Deleted

## 2021-07-23 DIAGNOSIS — I2510 Atherosclerotic heart disease of native coronary artery without angina pectoris: Secondary | ICD-10-CM

## 2021-07-23 DIAGNOSIS — I480 Paroxysmal atrial fibrillation: Secondary | ICD-10-CM

## 2021-07-23 MED ORDER — RIVAROXABAN 20 MG PO TABS: 20.0000 mg | ORAL_TABLET | Freq: Every day | ORAL | 1 refills | Status: DC

## 2021-07-26 ENCOUNTER — Other Ambulatory Visit: Payer: Self-pay

## 2021-07-26 DIAGNOSIS — I2510 Atherosclerotic heart disease of native coronary artery without angina pectoris: Secondary | ICD-10-CM

## 2021-07-26 DIAGNOSIS — E782 Mixed hyperlipidemia: Secondary | ICD-10-CM

## 2021-07-26 MED ORDER — EZETIMIBE 10 MG PO TABS: 10.0000 mg | ORAL_TABLET | Freq: Every day | ORAL | 2 refills | Status: DC

## 2021-07-30 ENCOUNTER — Ambulatory Visit (HOSPITAL_COMMUNITY)
Admission: RE | Admit: 2021-07-30 | Discharge: 2021-07-30 | Disposition: A | Discharge status: Home / Self Care | Payer: Medicare Other | Source: Ambulatory Visit | Attending: Physician Assistant | Admitting: Physician Assistant

## 2021-07-30 ENCOUNTER — Other Ambulatory Visit: Payer: Self-pay

## 2021-07-30 VITALS — BP 120/76 | HR 92 | Ht 69.0 in | Wt 204.0 lb

## 2021-07-30 DIAGNOSIS — Z7901 Long term (current) use of anticoagulants: Secondary | ICD-10-CM | POA: Insufficient documentation

## 2021-07-30 DIAGNOSIS — I4819 Other persistent atrial fibrillation: Secondary | ICD-10-CM | POA: Insufficient documentation

## 2021-07-30 DIAGNOSIS — G4733 Obstructive sleep apnea (adult) (pediatric): Secondary | ICD-10-CM | POA: Insufficient documentation

## 2021-07-30 DIAGNOSIS — I10 Essential (primary) hypertension: Secondary | ICD-10-CM | POA: Insufficient documentation

## 2021-07-30 DIAGNOSIS — I2510 Atherosclerotic heart disease of native coronary artery without angina pectoris: Secondary | ICD-10-CM | POA: Diagnosis not present

## 2021-07-30 DIAGNOSIS — Z79899 Other long term (current) drug therapy: Secondary | ICD-10-CM | POA: Diagnosis not present

## 2021-07-30 DIAGNOSIS — D6869 Other thrombophilia: Secondary | ICD-10-CM | POA: Diagnosis not present

## 2021-07-30 LAB — CBC
HCT: 47.2 % (ref 39.0–52.0)
Hemoglobin: 15.6 g/dL (ref 13.0–17.0)
MCH: 29.6 pg (ref 26.0–34.0)
MCHC: 33.1 g/dL (ref 30.0–36.0)
MCV: 89.6 fL (ref 80.0–100.0)
Platelets: 220 10*3/uL (ref 150–400)
RBC: 5.27 MIL/uL (ref 4.22–5.81)
RDW: 12.8 % (ref 11.5–15.5)
WBC: 6.6 10*3/uL (ref 4.0–10.5)
nRBC: 0 % (ref 0.0–0.2)

## 2021-07-30 LAB — BASIC METABOLIC PANEL
Anion gap: 7 (ref 5–15)
BUN: 13 mg/dL (ref 8–23)
CO2: 25 mmol/L (ref 22–32)
Calcium: 9.1 mg/dL (ref 8.9–10.3)
Chloride: 109 mmol/L (ref 98–111)
Creatinine, Ser: 0.99 mg/dL (ref 0.61–1.24)
GFR, Estimated: >60 mL/min (ref >60)
Glucose, Bld: 78 mg/dL (ref 70–99)
Potassium: 3.9 mmol/L (ref 3.5–5.1)
Sodium: 141 mmol/L (ref 135–145)

## 2021-07-30 NOTE — Patient Instructions (Signed)
Cardioversion scheduled for Wednesday, January 18th  - Arrive at the [facility] and go to admitting at 12PM  - Do not eat or drink anything after midnight the night prior to your procedure.  - Take all your morning medication (except diabetic medications) with a sip of water prior to arrival.  - You will not be able to drive home after your procedure.  - Do NOT miss any doses of your blood thinner - if you should miss a dose please notify our office immediately.  - If you feel as if you go back into normal rhythm prior to scheduled cardioversion, please notify our office immediately. If your procedure is canceled in the cardioversion suite you will be charged a cancellation fee. Patients will be asked to: to mask in public and hand hygiene (no longer quarantine) in the 3 days prior to surgery, to report if any COVID-19-like illness or household contacts to COVID-19 to determine need for testing

## 2021-07-30 NOTE — H&P (View-Only) (Signed)
Primary Care Physician: [name], MD Referring Physician: [name] is a 76 y.o. male with a h/o paroxysmal afib that is in the clinic for going back into afib sometime over the last couple of days.He had an colonoscopy last Friday which may have been  the trigger. He came off anticoagulation for 2 days prior. He is rate controled today.  F/u in afib clinic, 07/11/19, after successful  cardioversion and continues in SR. He c/o of some burning/nausea after the cardioversion but states it was actually going on before then as he had stopped Vit C thinking he was not tolerating. He has seen his PCP and was started on PPI and symptoms  are  improving.   F/u in afib clinic, 09/19/19, as pt has gone back into  afib and is requesting cardioversion.He had successful cardioversion in December 2020. He feels the trigger was the covid shot. The afib started the next day after the shot. He is rate controlled. He has had 3 ablations in the past. He was out of town when he went into afib. Low dose BB was added but he remains in afib   F/u in afib clinic, 10/03/19. He had successful cardioversion and remains in SR. No further episodes of afib. EKG shows SR at 57 bpm. He still feels second covid shot may have done triggered afib.   Follow up in the AF clinic 07/30/21. He reports that he felt he was in afib on 07/25/21 with palpitations and fatigue. He was visiting family in [place] and presented to the ED there. Since he was stable and rate controlled, he was not cardioverted. He remains in rate controlled afib today. No missed doses of anticoagulation.   Today, he denies symptoms of chest pain, shortness of breath, orthopnea, PND, lower extremity edema, dizziness, presyncope, syncope, or neurologic sequela. The patient is tolerating medications without difficulties and is otherwise without complaint today.   Past Medical History:  Diagnosis Date   Bladder cancer ([place])    Hearing loss of  both ears    History of colon polyps    BENIGN   Hyperlipidemia    Hypertension    Mitral regurgitation    OSA on CPAP    PAD (peripheral artery disease) (HCC)    ABI--  RIGHT SIDE NORMAL LEFT SIDE MODERATELY REDUCED W/ DISTAL LEFT SFA STENOSIS//  MILD CALDICATION   PAF (paroxysmal atrial fibrillation) ([place])     a. s/p PVI 2014; b. s/p redo PVI and CTI 01/2015   Vitamin D deficiency    Past Surgical History:  Procedure Laterality Date   ABLATION OF DYSRHYTHMIC FOCUS  01/26/2015   ATRIAL FIBRILLATION ABLATION N/A 07/27/2012   Procedure: ATRIAL FIBRILLATION ABLATION;  Surgeon: [name], MD;  Location: [place] CATH LAB;  Service: Cardiovascular;  Laterality: N/A;   ATRIAL FIBRILLATION ABLATION N/A 08/25/2018   Procedure: ATRIAL FIBRILLATION ABLATION;  Surgeon: [name], MD;  Location: [place] CV LAB;  Service: Cardiovascular;  Laterality: N/A;   CARDIAC ELECTROPHYSIOLOGY STUDY AND ABLATION  07-27-2012  DR [name]   SUCCESSFUL ABLATION OF A-FIB   CARDIOVERSION  08/06/2012   Procedure: CARDIOVERSION;  Surgeon: [name], MD;  Location: [facility] ENDOSCOPY;  Service: Cardiovascular;  Laterality: N/A;   CARDIOVERSION N/A 08/25/2014   Procedure: CARDIOVERSION;  Surgeon: Larey Dresser, MD;  Location: [place];  Service: Cardiovascular;  Laterality: N/A;   CARDIOVERSION N/A 11/13/2014   Procedure: CARDIOVERSION;  Surgeon: Sueanne Margarita, MD;  Location: [place];  Service: Cardiovascular;  Laterality: N/A;   CARDIOVERSION N/A 12/28/2014   Procedure: CARDIOVERSION;  Surgeon: [name], MD;  Location: [place];  Service: Cardiovascular;  Laterality: N/A;   CARDIOVERSION N/A 01/30/2017   Procedure: CARDIOVERSION;  Surgeon: Josue Hector, MD;  Location: [facility] ENDOSCOPY;  Service: Cardiovascular;  Laterality: N/A;   CARDIOVERSION N/A 04/06/2017   Procedure: CARDIOVERSION;  Surgeon: Dorothy Spark, MD;  Location: [facility] ENDOSCOPY;  Service: Cardiovascular;  Laterality: N/A;    CARDIOVERSION N/A 05/25/2018   Procedure: CARDIOVERSION;  Surgeon: Skeet Latch, MD;  Location: [place];  Service: Cardiovascular;  Laterality: N/A;   CARDIOVERSION N/A 08/09/2018   Procedure: CARDIOVERSION;  Surgeon: Buford Dresser, MD;  Location: [facility] ENDOSCOPY;  Service: Cardiovascular;  Laterality: N/A;   CARDIOVERSION N/A 07/04/2019   Procedure: CARDIOVERSION;  Surgeon: Dorothy Spark, MD;  Location: [facility] ENDOSCOPY;  Service: Cardiovascular;  Laterality: N/A;   CARDIOVERSION N/A 09/23/2019   Procedure: CARDIOVERSION;  Surgeon: Dorothy Spark, MD;  Location: [place];  Service: Cardiovascular;  Laterality: N/A;   CATARACT EXTRACTION W/ INTRAOCULAR LENS  IMPLANT, BILATERAL     COLONOSCOPY WITH PROPOFOL N/A 02/13/2015   Procedure: COLONOSCOPY WITH PROPOFOL;  Surgeon: Garlan Fair, MD;  Location[facility] ENDOSCOPY;  Service: Endoscopy;  Laterality: N/A;   CYSTOSCOPY W/ RETROGRADES Bilateral 04/12/2013   Procedure: CYSTOSCOPY WITH RETROGRADE PYELOGRAM;  Surgeon: [name], MD;  Location: [facility];  Service: Urology;  Laterality: Bilateral;   CYSTOSCOPY WITH RETROGRADE PYELOGRAM, URETEROSCOPY AND STENT PLACEMENT  03/06/2021   Procedure: CYSTOSCOPY WITH BILATERAL RETROGRADE PYELOGRAM, LEFT URETEROSCOPY AND STENT PLACEMENT;  Surgeon: [name], MD;  Location: [facility];  Service: Urology;;  1 HR   ELECTROPHYSIOLOGIC STUDY N/A 01/26/2015   Procedure: Atrial Fibrillation Ablation;  Surgeon: [name], MD;  Location: [place] CV LAB;  Service: Cardiovascular;  Laterality: N/A;   HOLMIUM LASER APPLICATION Left 2/97/9892   Procedure: HOLMIUM LASER APPLICATION;  Surgeon: [name], MD;  Location: [facility];  Service: Urology;  Laterality: Left;   JOINT REPLACEMENT     LEFT HEART CATHETERIZATION WITH CORONARY ANGIOGRAM N/A 11/24/2013   Procedure: LEFT HEART CATHETERIZATION WITH CORONARY ANGIOGRAM;  Surgeon:  [name], MD;  Location: [facility] CATH LAB;  Service: Cardiovascular;  Laterality: N/A;   PERCUTANEOUS CORONARY ROTOBLATOR INTERVENTION (PCI-R) N/A 11/28/2013   Procedure: PERCUTANEOUS CORONARY ROTOBLATOR INTERVENTION (PCI-R);  Surgeon: [name], MD;  Location: [facility] CATH LAB;  Service: Cardiovascular;  Laterality: N/A;   PERCUTANEOUS CORONARY STENT INTERVENTION (PCI-S)  11/24/2013   Procedure: PERCUTANEOUS CORONARY STENT INTERVENTION (PCI-S);  Surgeon: [name], MD;  Location: [facility] CATH LAB;  Service: Cardiovascular;;   TEE WITHOUT CARDIOVERSION  07/26/2012   Procedure: TRANSESOPHAGEAL ECHOCARDIOGRAM (TEE);  Surgeon: [name], MD;  Location: [facility] ENDOSCOPY;  Service: Cardiovascular;  Laterality: N/A;   TEE WITHOUT CARDIOVERSION N/A 08/25/2014   Procedure: TRANSESOPHAGEAL ECHOCARDIOGRAM (TEE);  Surgeon: Larey Dresser, MD;  Location: [place];  Service: Cardiovascular;  Laterality: N/A;   TEE WITHOUT CARDIOVERSION  01/26/2015   Procedure: Transesophageal Echocardiogram (Tee);  Surgeon: [name], MD;  Location: [place] CV LAB;  Service: Cardiovascular;;   TEE WITHOUT CARDIOVERSION N/A 07/04/2019   Procedure: TRANSESOPHAGEAL ECHOCARDIOGRAM (TEE);  Surgeon: Dorothy Spark, MD;  Location: [facility] ENDOSCOPY;  Service: Cardiovascular;  Laterality: N/A;   TONSILLECTOMY  age 52   TOTAL HIP ARTHROPLASTY  07/25/2011   Procedure: TOTAL HIP ARTHROPLASTY ANTERIOR APPROACH;  Surgeon: Mcarthur Rossetti;  Location: [facility];  Service: Orthopedics;  Laterality: Right;   TOTAL HIP ARTHROPLASTY Left 02/08/2010   TRANSURETHRAL RESECTION OF BLADDER TUMOR WITH GYRUS (TURBT-GYRUS) N/A 04/12/2013   Procedure: TRANSURETHRAL RESECTION OF BLADDER TUMOR WITH GYRUS (TURBT-GYRUS);  Surgeon: [name], MD;  Location: [facility];  Service: Urology;  Laterality: N/A;    Current Outpatient Medications  Medication Sig Dispense Refill    bisoprolol-hydrochlorothiazide (ZIAC) 10-6.25 MG tablet Take 1 tablet Daily for BP 90 tablet 3   Cholecalciferol (VITAMIN D3) 125 MCG (5000 UT) CAPS Take 10,000 units Daily 30 capsule    diltiazem (CARDIZEM CD) 180 MG 24 hr capsule Take 1 capsule by mouth once daily 90 capsule 3   ezetimibe (ZETIA) 10 MG tablet Take 1 tablet (10 mg total) by mouth daily. for cholesterol. 90 tablet 2   Multiple Vitamin (MULTIVITAMIN WITH MINERALS) TABS tablet Take 1 tablet Daily     nitroGLYCERIN (NITROSTAT) 0.4 MG SL tablet DISSOLVE ONE TABLET UNDER THE TONGUE EVERY 5 MINUTES AS NEEDED FOR CHEST PAIN.DO NOT EXCEED A TOTAL OF 3 DOSES IN 15 MINUTES 25 tablet 6   Polyvinyl Alcohol-Povidone (REFRESH OP) Place 1 drop into both eyes daily as needed (dry eyes).     rivaroxaban (XARELTO) 20 MG TABS tablet Take 1 tablet (20 mg total) by mouth daily with supper. 90 tablet 1   rosuvastatin (CRESTOR) 40 MG tablet Take 1/2 tablet Daily for Cholesterol 45 tablet 3   No current facility-administered medications for this encounter.    No Known Allergies  Social History   Socioeconomic History   Marital status: Married    Spouse name: Not on file   Number of children: Not on file   Years of education: Not on file   Highest education level: Not on file  Occupational History   Not on file  Tobacco Use   Smoking status: Former    Packs/day: 2.00    Years: 10.00    Pack years: 20.00    Types: Cigarettes    Quit date: 09/20/1981    Years since quitting: 39.8   Smokeless tobacco: Never  Vaping Use   Vaping Use: Never used  Substance and Sexual Activity   Alcohol use: No    Comment: pt states he has stopped drinking alcohol   Drug use: No   Sexual activity: Not on file  Other Topics Concern   Not on file  Social History Narrative   Not on file   Social Determinants of Health   Financial Resource Strain: Not on file  Food Insecurity: Not on file  Transportation Needs: Not on file  Physical Activity: Not on  file  Stress: Not on file  Social Connections: Not on file  Intimate Partner Violence: Not on file    Family History  Problem Relation Age of Onset   Heart attack Mother    Heart disease Mother    Heart failure Father    Heart disease Father     ROS- All systems are reviewed and negative except as per the HPI above  Physical Exam: Vitals:   07/30/21 0834  BP: 120/76  Pulse: 92  Weight: 92.5 kg  Height: 5\' 9"  (1.753 m)    Wt Readings from Last 3 Encounters:  07/30/21 92.5 kg  04/08/21 93 kg  03/06/21 92.1 kg    Labs: Lab Results  Component Value Date   NA 142 03/06/2021   K 4.4 03/06/2021   CL 108 03/06/2021  CO2 30 03/05/2021   GLUCOSE 101 (H) 03/06/2021   BUN 15 03/06/2021   CREATININE 0.80 03/06/2021   CALCIUM 9.8 03/05/2021   MG 2.2 03/05/2021   Lab Results  Component Value Date   INR 1.1 (H) 11/16/2013   Lab Results  Component Value Date   CHOL 137 03/05/2021   HDL 64 03/05/2021   LDLCALC 57 03/05/2021   TRIG 81 03/05/2021    GEN- The patient is a well appearing obese elderly male, alert and oriented x 3 today.   HEENT-head normocephalic, atraumatic, sclera clear, conjunctiva pink, hearing intact, trachea midline. Lungs- Clear to ausculation bilaterally, normal work of breathing Heart- irregular rate and rhythm, no murmurs, rubs or gallops  GI- soft, NT, ND, + BS Extremities- no clubbing, cyanosis, or edema MS- no significant deformity or atrophy Skin- no rash or lesion Psych- euthymic mood, full affect Neuro- strength and sensation are intact   EKG- afib Vent. rate 92 BPM PR interval * ms QRS duration 96 ms QT/QTcB 372/460 ms    Assessment and Plan: 1. Persistent afib  S/p ablation 2014 and 2020 Patient back in symptomatic afib.  Will arrange for DCCV. Check bmet/cbc. Continue Xarelto 20 mg daily. Patient expresses interest in changing to Eliquis as this is cheaper with his insurance. Will continue Xarelto for now through the DCCV  then will send in Rx for Eliquis.  Continue diltiazem 180 mg daily  2. CHA2DS2VASc score of 4 Continue  xarelto 20 mg daily   3. HTN Stable, no changes today.  4. OSA Followed by Dr Maxwell Caul  5. CAD No anginal symptoms.    Follow up in the AF clinic post DCCV.    [facility] [address] [place], [place] [address] [phone]

## 2021-07-30 NOTE — Progress Notes (Signed)
Primary Care Physician: [name], MD Referring Physician: [name] is a 76 y.o. male with a h/o paroxysmal afib that is in the clinic for going back into afib sometime over the last couple of days.He had an colonoscopy last Friday which may have been  the trigger. He came off anticoagulation for 2 days prior. He is rate controled today.  F/u in afib clinic, 07/11/19, after successful  cardioversion and continues in SR. He c/o of some burning/nausea after the cardioversion but states it was actually going on before then as he had stopped Vit C thinking he was not tolerating. He has seen his PCP and was started on PPI and symptoms  are  improving.   F/u in afib clinic, 09/19/19, as pt has gone back into  afib and is requesting cardioversion.He had successful cardioversion in December 2020. He feels the trigger was the covid shot. The afib started the next day after the shot. He is rate controlled. He has had 3 ablations in the past. He was out of town when he went into afib. Low dose BB was added but he remains in afib   F/u in afib clinic, 10/03/19. He had successful cardioversion and remains in SR. No further episodes of afib. EKG shows SR at 57 bpm. He still feels second covid shot may have done triggered afib.   Follow up in the AF clinic 07/30/21. He reports that he felt he was in afib on 07/25/21 with palpitations and fatigue. He was visiting family in [place] and presented to the ED there. Since he was stable and rate controlled, he was not cardioverted. He remains in rate controlled afib today. No missed doses of anticoagulation.   Today, he denies symptoms of chest pain, shortness of breath, orthopnea, PND, lower extremity edema, dizziness, presyncope, syncope, or neurologic sequela. The patient is tolerating medications without difficulties and is otherwise without complaint today.   Past Medical History:  Diagnosis Date   Bladder cancer ([place])    Hearing loss of  both ears    History of colon polyps    BENIGN   Hyperlipidemia    Hypertension    Mitral regurgitation    OSA on CPAP    PAD (peripheral artery disease) ([facility])    ABI--  RIGHT SIDE NORMAL LEFT SIDE MODERATELY REDUCED W/ DISTAL LEFT SFA STENOSIS//  MILD CALDICATION   PAF (paroxysmal atrial fibrillation) ([place])     a. s/p PVI 2014; b. s/p redo PVI and CTI 01/2015   Vitamin D deficiency    Past Surgical History:  Procedure Laterality Date   ABLATION OF DYSRHYTHMIC FOCUS  01/26/2015   ATRIAL FIBRILLATION ABLATION N/A 07/27/2012   Procedure: ATRIAL FIBRILLATION ABLATION;  Surgeon: [name], MD;  Location: [facility] CATH LAB;  Service: Cardiovascular;  Laterality: N/A;   ATRIAL FIBRILLATION ABLATION N/A 08/25/2018   Procedure: ATRIAL FIBRILLATION ABLATION;  Surgeon: [name], MD;  Location: [facility] CV LAB;  Service: Cardiovascular;  Laterality: N/A;   CARDIAC ELECTROPHYSIOLOGY STUDY AND ABLATION  07-27-2012  DR [name]   SUCCESSFUL ABLATION OF A-FIB   CARDIOVERSION  08/06/2012   Procedure: CARDIOVERSION;  Surgeon: [name], MD;  Location: [facility] ENDOSCOPY;  Service: Cardiovascular;  Laterality: N/A;   CARDIOVERSION N/A 08/25/2014   Procedure: CARDIOVERSION;  Surgeon: Larey Dresser, MD;  Location: [place];  Service: Cardiovascular;  Laterality: N/A;   CARDIOVERSION N/A 11/13/2014   Procedure: CARDIOVERSION;  Surgeon: Sueanne Margarita, MD;  Location: [place];  Service: Cardiovascular;  Laterality: N/A;   CARDIOVERSION N/A 12/28/2014   Procedure: CARDIOVERSION;  Surgeon: [name], MD;  Location: [place];  Service: Cardiovascular;  Laterality: N/A;   CARDIOVERSION N/A 01/30/2017   Procedure: CARDIOVERSION;  Surgeon: Josue Hector, MD;  Location: [facility] ENDOSCOPY;  Service: Cardiovascular;  Laterality: N/A;   CARDIOVERSION N/A 04/06/2017   Procedure: CARDIOVERSION;  Surgeon: Dorothy Spark, MD;  Location: [facility] ENDOSCOPY;  Service: Cardiovascular;  Laterality: N/A;    CARDIOVERSION N/A 05/25/2018   Procedure: CARDIOVERSION;  Surgeon: Skeet Latch, MD;  Location: [place];  Service: Cardiovascular;  Laterality: N/A;   CARDIOVERSION N/A 08/09/2018   Procedure: CARDIOVERSION;  Surgeon: Buford Dresser, MD;  Location: [facility] ENDOSCOPY;  Service: Cardiovascular;  Laterality: N/A;   CARDIOVERSION N/A 07/04/2019   Procedure: CARDIOVERSION;  Surgeon: Dorothy Spark, MD;  Location: [facility] ENDOSCOPY;  Service: Cardiovascular;  Laterality: N/A;   CARDIOVERSION N/A 09/23/2019   Procedure: CARDIOVERSION;  Surgeon: Dorothy Spark, MD;  Location: [place];  Service: Cardiovascular;  Laterality: N/A;   CATARACT EXTRACTION W/ INTRAOCULAR LENS  IMPLANT, BILATERAL     COLONOSCOPY WITH PROPOFOL N/A 02/13/2015   Procedure: COLONOSCOPY WITH PROPOFOL;  Surgeon: Garlan Fair, MD;  Location: [facility] ENDOSCOPY;  Service: Endoscopy;  Laterality: N/A;   CYSTOSCOPY W/ RETROGRADES Bilateral 04/12/2013   Procedure: CYSTOSCOPY WITH RETROGRADE PYELOGRAM;  Surgeon: [name], MD;  Location: [facility];  Service: Urology;  Laterality: Bilateral;   CYSTOSCOPY WITH RETROGRADE PYELOGRAM, URETEROSCOPY AND STENT PLACEMENT  03/06/2021   Procedure: CYSTOSCOPY WITH BILATERAL RETROGRADE PYELOGRAM, LEFT URETEROSCOPY AND STENT PLACEMENT;  Surgeon: [name], MD;  Location: [facility];  Service: Urology;;  1 HR   ELECTROPHYSIOLOGIC STUDY N/A 01/26/2015   Procedure: Atrial Fibrillation Ablation;  Surgeon: [name], MD;  Location: [place] CV LAB;  Service: Cardiovascular;  Laterality: N/A;   HOLMIUM LASER APPLICATION Left 04/10/1940   Procedure: HOLMIUM LASER APPLICATION;  Surgeon: [name], MD;  Location: [facility];  Service: Urology;  Laterality: Left;   JOINT REPLACEMENT     LEFT HEART CATHETERIZATION WITH CORONARY ANGIOGRAM N/A 11/24/2013   Procedure: LEFT HEART CATHETERIZATION WITH CORONARY ANGIOGRAM;  Surgeon:  [name], MD;  Location: [facility] CATH LAB;  Service: Cardiovascular;  Laterality: N/A;   PERCUTANEOUS CORONARY ROTOBLATOR INTERVENTION (PCI-R) N/A 11/28/2013   Procedure: PERCUTANEOUS CORONARY ROTOBLATOR INTERVENTION (PCI-R);  Surgeon: [name], MD;  Location: [facility] CATH LAB;  Service: Cardiovascular;  Laterality: N/A;   PERCUTANEOUS CORONARY STENT INTERVENTION (PCI-S)  11/24/2013   Procedure: PERCUTANEOUS CORONARY STENT INTERVENTION (PCI-S);  Surgeon: [name], MD;  Location: [facility] CATH LAB;  Service: Cardiovascular;;   TEE WITHOUT CARDIOVERSION  07/26/2012   Procedure: TRANSESOPHAGEAL ECHOCARDIOGRAM (TEE);  Surgeon: [name], MD;  Location: [facility] ENDOSCOPY;  Service: Cardiovascular;  Laterality: N/A;   TEE WITHOUT CARDIOVERSION N/A 08/25/2014   Procedure: TRANSESOPHAGEAL ECHOCARDIOGRAM (TEE);  Surgeon: Larey Dresser, MD;  Location: [place];  Service: Cardiovascular;  Laterality: N/A;   TEE WITHOUT CARDIOVERSION  01/26/2015   Procedure: Transesophageal Echocardiogram (Tee);  Surgeon: [name], MD;  Location: [place] CV LAB;  Service: Cardiovascular;;   TEE WITHOUT CARDIOVERSION N/A 07/04/2019   Procedure: TRANSESOPHAGEAL ECHOCARDIOGRAM (TEE);  Surgeon: Dorothy Spark, MD;  Location: [facility] ENDOSCOPY;  Service: Cardiovascular;  Laterality: N/A;   TONSILLECTOMY  age 53   TOTAL HIP ARTHROPLASTY  07/25/2011   Procedure: TOTAL HIP ARTHROPLASTY ANTERIOR APPROACH;  Surgeon: Mcarthur Rossetti;  Location: [facility] ORS;  Service: Orthopedics;  Laterality: Right;   TOTAL HIP ARTHROPLASTY Left 02/08/2010   TRANSURETHRAL RESECTION OF BLADDER TUMOR WITH GYRUS (TURBT-GYRUS) N/A 04/12/2013   Procedure: TRANSURETHRAL RESECTION OF BLADDER TUMOR WITH GYRUS (TURBT-GYRUS);  Surgeon: [name], MD;  Location: [facility];  Service: Urology;  Laterality: N/A;    Current Outpatient Medications  Medication Sig Dispense Refill    bisoprolol-hydrochlorothiazide (ZIAC) 10-6.25 MG tablet Take 1 tablet Daily for BP 90 tablet 3   Cholecalciferol (VITAMIN D3) 125 MCG (5000 UT) CAPS Take 10,000 units Daily 30 capsule    diltiazem (CARDIZEM CD) 180 MG 24 hr capsule Take 1 capsule by mouth once daily 90 capsule 3   ezetimibe (ZETIA) 10 MG tablet Take 1 tablet (10 mg total) by mouth daily. for cholesterol. 90 tablet 2   Multiple Vitamin (MULTIVITAMIN WITH MINERALS) TABS tablet Take 1 tablet Daily     nitroGLYCERIN (NITROSTAT) 0.4 MG SL tablet DISSOLVE ONE TABLET UNDER THE TONGUE EVERY 5 MINUTES AS NEEDED FOR CHEST PAIN.DO NOT EXCEED A TOTAL OF 3 DOSES IN 15 MINUTES 25 tablet 6   Polyvinyl Alcohol-Povidone (REFRESH OP) Place 1 drop into both eyes daily as needed (dry eyes).     rivaroxaban (XARELTO) 20 MG TABS tablet Take 1 tablet (20 mg total) by mouth daily with supper. 90 tablet 1   rosuvastatin (CRESTOR) 40 MG tablet Take 1/2 tablet Daily for Cholesterol 45 tablet 3   No current facility-administered medications for this encounter.    No Known Allergies  Social History   Socioeconomic History   Marital status: Married    Spouse name: Not on file   Number of children: Not on file   Years of education: Not on file   Highest education level: Not on file  Occupational History   Not on file  Tobacco Use   Smoking status: Former    Packs/day: 2.00    Years: 10.00    Pack years: 20.00    Types: Cigarettes    Quit date: 09/20/1981    Years since quitting: 39.8   Smokeless tobacco: Never  Vaping Use   Vaping Use: Never used  Substance and Sexual Activity   Alcohol use: No    Comment: pt states he has stopped drinking alcohol   Drug use: No   Sexual activity: Not on file  Other Topics Concern   Not on file  Social History Narrative   Not on file   Social Determinants of Health   Financial Resource Strain: Not on file  Food Insecurity: Not on file  Transportation Needs: Not on file  Physical Activity: Not on  file  Stress: Not on file  Social Connections: Not on file  Intimate Partner Violence: Not on file    Family History  Problem Relation Age of Onset   Heart attack Mother    Heart disease Mother    Heart failure Father    Heart disease Father     ROS- All systems are reviewed and negative except as per the HPI above  Physical Exam: Vitals:   07/30/21 0834  BP: 120/76  Pulse: 92  Weight: 92.5 kg  Height: 5\' 9"  (1.753 m)    Wt Readings from Last 3 Encounters:  07/30/21 92.5 kg  04/08/21 93 kg  03/06/21 92.1 kg    Labs: Lab Results  Component Value Date   NA 142 03/06/2021   K 4.4 03/06/2021   CL 108 03/06/2021  CO2 30 03/05/2021   GLUCOSE 101 (H) 03/06/2021   BUN 15 03/06/2021   CREATININE 0.80 03/06/2021   CALCIUM 9.8 03/05/2021   MG 2.2 03/05/2021   Lab Results  Component Value Date   INR 1.1 (H) 11/16/2013   Lab Results  Component Value Date   CHOL 137 03/05/2021   HDL 64 03/05/2021   LDLCALC 57 03/05/2021   TRIG 81 03/05/2021    GEN- The patient is a well appearing obese elderly male, alert and oriented x 3 today.   HEENT-head normocephalic, atraumatic, sclera clear, conjunctiva pink, hearing intact, trachea midline. Lungs- Clear to ausculation bilaterally, normal work of breathing Heart- irregular rate and rhythm, no murmurs, rubs or gallops  GI- soft, NT, ND, + BS Extremities- no clubbing, cyanosis, or edema MS- no significant deformity or atrophy Skin- no rash or lesion Psych- euthymic mood, full affect Neuro- strength and sensation are intact   EKG- afib Vent. rate 92 BPM PR interval * ms QRS duration 96 ms QT/QTcB 372/460 ms    Assessment and Plan: 1. Persistent afib  S/p ablation 2014 and 2020 Patient back in symptomatic afib.  Will arrange for DCCV. Check bmet/cbc. Continue Xarelto 20 mg daily. Patient expresses interest in changing to Eliquis as this is cheaper with his insurance. Will continue Xarelto for now through the DCCV  then will send in Rx for Eliquis.  Continue diltiazem 180 mg daily  2. CHA2DS2VASc score of 4 Continue  xarelto 20 mg daily   3. HTN Stable, no changes today.  4. OSA Followed by Dr Maxwell Caul  5. CAD No anginal symptoms.    Follow up in the AF clinic post DCCV.    [facility] [address] [place], [place] [address] [phone]

## 2021-07-31 ENCOUNTER — Encounter (HOSPITAL_COMMUNITY): Payer: Self-pay | Admitting: Cardiology

## 2021-07-31 NOTE — Progress Notes (Signed)
Attempted to obtain medical history via telephone, unable to reach at this time. I left a voicemail to return pre surgical testing department's phone call.  

## 2021-08-07 ENCOUNTER — Ambulatory Visit (HOSPITAL_COMMUNITY)
Admission: RE | Admit: 2021-08-07 | Discharge: 2021-08-07 | Disposition: A | Discharge status: Home / Self Care | Payer: Medicare Other | Attending: Cardiology | Admitting: Cardiology

## 2021-08-07 ENCOUNTER — Encounter (HOSPITAL_COMMUNITY): Payer: Self-pay | Admitting: Cardiology

## 2021-08-07 ENCOUNTER — Encounter (HOSPITAL_COMMUNITY)
Admission: RE | Disposition: A | Discharge status: Home / Self Care | Payer: Self-pay | Source: Home / Self Care | Attending: Cardiology

## 2021-08-07 ENCOUNTER — Ambulatory Visit (HOSPITAL_COMMUNITY): Payer: Medicare Other | Admitting: Certified Registered"

## 2021-08-07 DIAGNOSIS — Z951 Presence of aortocoronary bypass graft: Secondary | ICD-10-CM | POA: Diagnosis not present

## 2021-08-07 DIAGNOSIS — Z79899 Other long term (current) drug therapy: Secondary | ICD-10-CM | POA: Insufficient documentation

## 2021-08-07 DIAGNOSIS — I2510 Atherosclerotic heart disease of native coronary artery without angina pectoris: Secondary | ICD-10-CM

## 2021-08-07 DIAGNOSIS — Z7901 Long term (current) use of anticoagulants: Secondary | ICD-10-CM | POA: Insufficient documentation

## 2021-08-07 DIAGNOSIS — Z87891 Personal history of nicotine dependence: Secondary | ICD-10-CM | POA: Insufficient documentation

## 2021-08-07 DIAGNOSIS — G4730 Sleep apnea, unspecified: Secondary | ICD-10-CM | POA: Insufficient documentation

## 2021-08-07 DIAGNOSIS — E785 Hyperlipidemia, unspecified: Secondary | ICD-10-CM | POA: Insufficient documentation

## 2021-08-07 DIAGNOSIS — I10 Essential (primary) hypertension: Secondary | ICD-10-CM | POA: Insufficient documentation

## 2021-08-07 DIAGNOSIS — I480 Paroxysmal atrial fibrillation: Secondary | ICD-10-CM | POA: Diagnosis not present

## 2021-08-07 DIAGNOSIS — I4891 Unspecified atrial fibrillation: Secondary | ICD-10-CM

## 2021-08-07 DIAGNOSIS — K219 Gastro-esophageal reflux disease without esophagitis: Secondary | ICD-10-CM | POA: Insufficient documentation

## 2021-08-07 HISTORY — PX: CARDIOVERSION: SHX1299

## 2021-08-07 SURGERY — CARDIOVERSION
Anesthesia: General

## 2021-08-07 MED ORDER — BISOPROLOL-HYDROCHLOROTHIAZIDE 10-6.25 MG PO TABS: 1.0000 | ORAL_TABLET | Freq: Every evening | ORAL | Status: DC

## 2021-08-07 MED ORDER — LIDOCAINE 2% (20 MG/ML) 5 ML SYRINGE
INTRAMUSCULAR | Status: DC | PRN
  Administered 2021-08-07: 60 mg via INTRAVENOUS

## 2021-08-07 MED ORDER — SODIUM CHLORIDE 0.9 % IV SOLN: INTRAVENOUS | Status: DC

## 2021-08-07 MED ORDER — PROPOFOL 10 MG/ML IV BOLUS
INTRAVENOUS | Status: DC | PRN
Start: 2021-08-07 — End: 2021-08-07
  Administered 2021-08-07: 60 mg via INTRAVENOUS

## 2021-08-07 NOTE — Discharge Instructions (Signed)

## 2021-08-07 NOTE — CV Procedure (Signed)
° °  Electrical Cardioversion Procedure Note Damon [name] [id number] 06-13-1946  Procedure: Electrical Cardioversion Indications:  Atrial Fibrillation  Time Out: Verified patient identification, verified procedure,medications/allergies/relevent history reviewed, required imaging and test results available.  Performed  Procedure Details  The patient was NPO after midnight. Anesthesia was administered at the beside  by Dr.Turk with 60mg  of propofol and 60mg  of Lidocaine.  Cardioversion was done with synchronized biphasic defibrillation with AP pads with 150 watts.  The patient converted to normal sinus rhythm. The patient tolerated the procedure well   IMPRESSION:  Successful cardioversion of atrial fibrillation    Damon [name] 08/07/2021, 12:26 PM

## 2021-08-07 NOTE — Interval H&P Note (Signed)
History and Physical Interval Note:  08/07/2021 12:26 PM  Damon [name]  has presented today for surgery, with the diagnosis of AFIB.  The various methods of treatment have been discussed with the patient and family. After consideration of risks, benefits and other options for treatment, the patient has consented to  Procedure(s): CARDIOVERSION (N/A) as a surgical intervention.  The patient's history has been reviewed, patient examined, no change in status, stable for surgery.  I have reviewed the patient's chart and labs.  Questions were answered to the patient's satisfaction.     Fransico Him

## 2021-08-07 NOTE — Anesthesia Preprocedure Evaluation (Signed)
Anesthesia Evaluation  Patient identified by MRN, date of birth, ID band Patient awake    Reviewed: Allergy & Precautions, NPO status , Patient's Chart, lab work & pertinent test results, reviewed documented beta blocker date and time   Airway Mallampati: II  TM Distance: >3 FB Neck ROM: Full    Dental  (+) Teeth Intact, Dental Advisory Given   Pulmonary sleep apnea and Continuous Positive Airway Pressure Ventilation , former smoker,    Pulmonary exam normal breath sounds clear to auscultation       Cardiovascular hypertension, Pt. on home beta blockers and Pt. on medications (-) angina+ CAD, + Cardiac Stents and + Peripheral Vascular Disease   Rhythm:Irregular Rate:Abnormal     Neuro/Psych negative neurological ROS     GI/Hepatic Neg liver ROS, GERD  ,  Endo/Other  negative endocrine ROS  Renal/GU negative Renal ROS     Musculoskeletal negative musculoskeletal ROS (+)   Abdominal   Peds  Hematology  (+) Blood dyscrasia (Xarelto), ,   Anesthesia Other Findings Day of surgery medications reviewed with the patient.  Reproductive/Obstetrics                             Anesthesia Physical Anesthesia Plan  ASA: 3  Anesthesia Plan: General   Post-op Pain Management:    Induction: Intravenous  PONV Risk Score and Plan: 2 and TIVA and Treatment may vary due to age or medical condition  Airway Management Planned: Natural Airway and Simple Face Mask  Additional Equipment:   Intra-op Plan:   Post-operative Plan:   Informed Consent: I have reviewed the patients History and Physical, chart, labs and discussed the procedure including the risks, benefits and alternatives for the proposed anesthesia with the patient or authorized representative who has indicated his/her understanding and acceptance.     Dental advisory given  Plan Discussed with: CRNA  Anesthesia Plan Comments:          Anesthesia Quick Evaluation

## 2021-08-07 NOTE — Transfer of Care (Signed)
Immediate Anesthesia Transfer of Care Note  Patient: Damon [name]  Procedure(s) Performed: CARDIOVERSION  Patient Location: Endoscopy Unit  Anesthesia Type:General  Level of Consciousness: drowsy  Airway & Oxygen Therapy: Patient Spontanous Breathing  Post-op Assessment: Report given to RN  Post vital signs: Reviewed and stable  Last Vitals:  Vitals Value Taken Time  BP 108/71   Temp    Pulse 60   Resp 13   SpO2 93     Last Pain:  Vitals:   08/07/21 1220  TempSrc: Temporal  PainSc: 0-No pain         Complications: No notable events documented.

## 2021-08-08 NOTE — Anesthesia Postprocedure Evaluation (Signed)
Anesthesia Post Note  Patient: Damon [name]  Procedure(s) Performed: CARDIOVERSION     Patient location during evaluation: Endoscopy Anesthesia Type: General Level of consciousness: awake and alert Pain management: pain level controlled Vital Signs Assessment: post-procedure vital signs reviewed and stable Respiratory status: spontaneous breathing, nonlabored ventilation, respiratory function stable and patient connected to nasal cannula oxygen Cardiovascular status: blood pressure returned to baseline and stable Postop Assessment: no apparent nausea or vomiting Anesthetic complications: no   No notable events documented.  Last Vitals:  Vitals:   08/07/21 1310 08/07/21 1320  BP: 98/66 102/65  Pulse: (!) 54 (!) 53  Resp: 14 13  Temp:    SpO2: 92% 95%    Last Pain:  Vitals:   08/07/21 1320  TempSrc:   PainSc: 0-No pain                 Santa Lighter

## 2021-08-09 ENCOUNTER — Encounter (HOSPITAL_COMMUNITY): Payer: Self-pay | Admitting: Cardiology

## 2021-08-14 ENCOUNTER — Ambulatory Visit (HOSPITAL_COMMUNITY): Payer: Medicare Other | Admitting: Nurse Practitioner

## 2021-08-21 ENCOUNTER — Encounter (HOSPITAL_COMMUNITY): Payer: Self-pay | Admitting: Nurse Practitioner

## 2021-08-21 ENCOUNTER — Ambulatory Visit (HOSPITAL_COMMUNITY)
Admission: RE | Admit: 2021-08-21 | Discharge: 2021-08-21 | Disposition: A | Discharge status: Home / Self Care | Payer: Medicare Other | Source: Ambulatory Visit | Attending: Nurse Practitioner | Admitting: Nurse Practitioner

## 2021-08-21 ENCOUNTER — Other Ambulatory Visit: Payer: Self-pay

## 2021-08-21 VITALS — BP 136/70 | HR 57 | Ht 69.0 in | Wt 203.8 lb

## 2021-08-21 DIAGNOSIS — G4733 Obstructive sleep apnea (adult) (pediatric): Secondary | ICD-10-CM | POA: Diagnosis not present

## 2021-08-21 DIAGNOSIS — Z7901 Long term (current) use of anticoagulants: Secondary | ICD-10-CM | POA: Diagnosis not present

## 2021-08-21 DIAGNOSIS — D6869 Other thrombophilia: Secondary | ICD-10-CM

## 2021-08-21 DIAGNOSIS — I10 Essential (primary) hypertension: Secondary | ICD-10-CM | POA: Diagnosis not present

## 2021-08-21 DIAGNOSIS — I2510 Atherosclerotic heart disease of native coronary artery without angina pectoris: Secondary | ICD-10-CM | POA: Diagnosis not present

## 2021-08-21 DIAGNOSIS — I4819 Other persistent atrial fibrillation: Secondary | ICD-10-CM

## 2021-08-21 DIAGNOSIS — Z79899 Other long term (current) drug therapy: Secondary | ICD-10-CM | POA: Insufficient documentation

## 2021-08-21 MED ORDER — APIXABAN 5 MG PO TABS: 5.0000 mg | ORAL_TABLET | Freq: Two times a day (BID) | ORAL | 0 refills | Status: DC

## 2021-08-21 NOTE — Patient Instructions (Signed)
Once you have finished your current prescription of xarelto - when your next dose of xarelto is due - start Eliquis 5mg  TWICE A DAY

## 2021-08-21 NOTE — Progress Notes (Signed)
Primary Care Physician: [name], MD Referring Physician: [name] is a 76 y.o. male with a h/o paroxysmal afib that is in the clinic for going back into afib sometime over the last couple of days.He had an colonoscopy last Friday which may have been  the trigger. He came off anticoagulation for 2 days prior. He is rate controled today.  F/u in afib clinic, 07/11/19, after successful  cardioversion and continues in SR. He c/o of some burning/nausea after the cardioversion but states it was actually going on before then as he had stopped Vit C thinking he was not tolerating. He has seen his PCP and was started on PPI and symptoms  are  improving.   F/u in afib clinic, 09/19/19, as pt has gone back into  afib and is requesting cardioversion.He had successful cardioversion in December 2020. He feels the trigger was the covid shot. The afib started the next day after the shot. He is rate controlled. He has had 3 ablations in the past. He was out of town when he went into afib. Low dose BB was added but he remains in afib   F/u in afib clinic, 10/03/19. He had successful cardioversion and remains in SR. No further episodes of afib. EKG shows SR at 57 bpm. He still feels second covid shot may have done triggered afib.   Follow up in the AF clinic 07/30/21. He reports that he felt he was in afib on 07/25/21 with palpitations and fatigue. He was visiting family in [place] and presented to the ED there. Since he was stable and rate controlled, he was not cardioverted. He remains in rate controlled afib today. No missed doses of anticoagulation.   F/u in afib clinic, 08/21/21. He had a successful cardioversion and remains  in afib. He says he went 2 yrs since his last cardioversion. He would like to switch over to Eliquis for the difference in cost and will send  him in 30 day free and then 90 day mail order.   Today, he denies symptoms of chest pain, shortness of breath, orthopnea, PND,  lower extremity edema, dizziness, presyncope, syncope, or neurologic sequela. The patient is tolerating medications without difficulties and is otherwise without complaint today.   Past Medical History:  Diagnosis Date   Bladder cancer ([place])    Hearing loss of both ears    History of colon polyps    BENIGN   Hyperlipidemia    Hypertension    Mitral regurgitation    OSA on CPAP    PAD (peripheral artery disease) ([facility])    ABI--  RIGHT SIDE NORMAL LEFT SIDE MODERATELY REDUCED W/ DISTAL LEFT SFA STENOSIS//  MILD CALDICATION   PAF (paroxysmal atrial fibrillation) ([place])     a. s/p PVI 2014; b. s/p redo PVI and CTI 01/2015   Vitamin D deficiency    Past Surgical History:  Procedure Laterality Date   ABLATION OF DYSRHYTHMIC FOCUS  01/26/2015   ATRIAL FIBRILLATION ABLATION N/A 07/27/2012   Procedure: ATRIAL FIBRILLATION ABLATION;  Surgeon: [name], MD;  Location: [facility]ATH [facility];  Service: Cardiovascular;  Laterality: N/A;   ATRIAL FIBRILLATION ABLATION N/A 08/25/2018   Procedure: ATRIAL FIBRILLATION ABLATION;  Surgeon: [name], MD;  Location: [place] CV LAB;  Service: Cardiovascular;  Laterality: N/A;   CARDIAC ELECTROPHYSIOLOGY STUDY AND ABLATION  07-27-2012  DR ALLRED   SUCCESSFUL ABLATION OF A-FIB   CARDIOVERSION  08/06/2012   Procedure: CARDIOVERSION;  Surgeon:  [name], MD;  Location: [place];  Service: Cardiovascular;  Laterality: N/A;   CARDIOVERSION N/A 08/25/2014   Procedure: CARDIOVERSION;  Surgeon: Larey Dresser, MD;  Location: [place];  Service: Cardiovascular;  Laterality: N/A;   CARDIOVERSION N/A 11/13/2014   Procedure: CARDIOVERSION;  Surgeon: Sueanne Margarita, MD;  Location: [place];  Service: Cardiovascular;  Laterality: N/A;   CARDIOVERSION N/A 12/28/2014   Procedure: CARDIOVERSION;  Surgeon: [name], MD;  Location: [place];  Service: Cardiovascular;  Laterality: N/A;   CARDIOVERSION N/A 01/30/2017   Procedure: CARDIOVERSION;   Surgeon: Josue Hector, MD;  Location: [facility] ENDOSCOPY;  Service: Cardiovascular;  Laterality: N/A;   CARDIOVERSION N/A 04/06/2017   Procedure: CARDIOVERSION;  Surgeon: Dorothy Spark, MD;  Location: [facility] ENDOSCOPY;  Service: Cardiovascular;  Laterality: N/A;   CARDIOVERSION N/A 05/25/2018   Procedure: CARDIOVERSION;  Surgeon: Skeet Latch, MD;  Location: [place];  Service: Cardiovascular;  Laterality: N/A;   CARDIOVERSION N/A 08/09/2018   Procedure: CARDIOVERSION;  Surgeon: Buford Dresser, MD;  Location: [facility] ENDOSCOPY;  Service: Cardiovascular;  Laterality: N/A;   CARDIOVERSION N/A 07/04/2019   Procedure: CARDIOVERSION;  Surgeon: Dorothy Spark, MD;  Location: [facility] ENDOSCOPY;  Service: Cardiovascular;  Laterality: N/A;   CARDIOVERSION N/A 09/23/2019   Procedure: CARDIOVERSION;  Surgeon: Dorothy Spark, MD;  Location: [facility] ENDOSCOPY;  Service: Cardiovascular;  Laterality: N/A;   CARDIOVERSION N/A 08/07/2021   Procedure: CARDIOVERSION;  Surgeon: Sueanne Margarita, MD;  Location: [facility] ENDOSCOPY;  Service: Cardiovascular;  Laterality: N/A;   CATARACT EXTRACTION W/ INTRAOCULAR LENS  IMPLANT, BILATERAL     COLONOSCOPY WITH PROPOFOL N/A 02/13/2015   Procedure: COLONOSCOPY WITH PROPOFOL;  Surgeon: Garlan Fair, MD;  Location: [facility] ENDOSCOPY;  Service: Endoscopy;  Laterality: N/A;   CYSTOSCOPY W/ RETROGRADES Bilateral 04/12/2013   Procedure: CYSTOSCOPY WITH RETROGRADE PYELOGRAM;  Surgeon: [name], MD;  Location: [facility];  Service: Urology;  Laterality: Bilateral;   CYSTOSCOPY WITH RETROGRADE PYELOGRAM, URETEROSCOPY AND STENT PLACEMENT  03/06/2021   Procedure: CYSTOSCOPY WITH BILATERAL RETROGRADE PYELOGRAM, LEFT URETEROSCOPY AND STENT PLACEMENT;  Surgeon: [name], MD;  Location: [facility];  Service: Urology;;  1 HR   ELECTROPHYSIOLOGIC STUDY N/A 01/26/2015   Procedure: Atrial Fibrillation Ablation;  Surgeon: [name], MD;  Location:  [place] CV LAB;  Service: Cardiovascular;  Laterality: N/A;   HOLMIUM LASER APPLICATION Left 2/95/6213   Procedure: HOLMIUM LASER APPLICATION;  Surgeon: [name], MD;  Location: [facility];  Service: Urology;  Laterality: Left;   JOINT REPLACEMENT     LEFT HEART CATHETERIZATION WITH CORONARY ANGIOGRAM N/A 11/24/2013   Procedure: LEFT HEART CATHETERIZATION WITH CORONARY ANGIOGRAM;  Surgeon: [name], MD;  Location: [facility] CATH LAB;  Service: Cardiovascular;  Laterality: N/A;   PERCUTANEOUS CORONARY ROTOBLATOR INTERVENTION (PCI-R) N/A 11/28/2013   Procedure: PERCUTANEOUS CORONARY ROTOBLATOR INTERVENTION (PCI-R);  Surgeon: [name], MD;  Location: [facility] CATH LAB;  Service: Cardiovascular;  Laterality: N/A;   PERCUTANEOUS CORONARY STENT INTERVENTION (PCI-S)  11/24/2013   Procedure: PERCUTANEOUS CORONARY STENT INTERVENTION (PCI-S);  Surgeon: [name], MD;  Location: [facility] CATH LAB;  Service: Cardiovascular;;   TEE WITHOUT CARDIOVERSION  07/26/2012   Procedure: TRANSESOPHAGEAL ECHOCARDIOGRAM (TEE);  Surgeon: [name], MD;  Location: [facility] ENDOSCOPY;  Service: Cardiovascular;  Laterality: N/A;   TEE WITHOUT CARDIOVERSION N/A 08/25/2014   Procedure: TRANSESOPHAGEAL ECHOCARDIOGRAM (TEE);  Surgeon: Larey Dresser, MD;  Location: [place];  Service: Cardiovascular;  Laterality: N/A;   TEE WITHOUT CARDIOVERSION  01/26/2015   Procedure: Transesophageal Echocardiogram (Tee);  Surgeon: [name], MD;  Location: [place] CV LAB;  Service: Cardiovascular;;   TEE WITHOUT CARDIOVERSION N/A 07/04/2019   Procedure: TRANSESOPHAGEAL ECHOCARDIOGRAM (TEE);  Surgeon: Dorothy Spark, MD;  Location: [facility] ENDOSCOPY;  Service: Cardiovascular;  Laterality: N/A;   TONSILLECTOMY  age 34   TOTAL HIP ARTHROPLASTY  07/25/2011   Procedure: TOTAL HIP ARTHROPLASTY ANTERIOR APPROACH;  Surgeon: Mcarthur Rossetti;  Location: [facility];  Service: Orthopedics;   Laterality: Right;   TOTAL HIP ARTHROPLASTY Left 02/08/2010   TRANSURETHRAL RESECTION OF BLADDER TUMOR WITH GYRUS (TURBT-GYRUS) N/A 04/12/2013   Procedure: TRANSURETHRAL RESECTION OF BLADDER TUMOR WITH GYRUS (TURBT-GYRUS);  Surgeon: [name], MD;  Location: [facility];  Service: Urology;  Laterality: N/A;    Current Outpatient Medications  Medication Sig Dispense Refill   bisoprolol-hydrochlorothiazide (ZIAC) 10-6.25 MG tablet Take 1 tablet by mouth every evening.     Cholecalciferol (VITAMIN D3) 125 MCG (5000 UT) CAPS Take 10,000 units Daily 30 capsule    diltiazem (CARDIZEM CD) 180 MG 24 hr capsule Take 1 capsule by mouth once daily 90 capsule 3   ezetimibe (ZETIA) 10 MG tablet Take 1 tablet (10 mg total) by mouth daily. for cholesterol. 90 tablet 2   Multiple Vitamin (MULTIVITAMIN WITH MINERALS) TABS tablet Take 1 tablet Daily     nitroGLYCERIN (NITROSTAT) 0.4 MG SL tablet DISSOLVE ONE TABLET UNDER THE TONGUE EVERY 5 MINUTES AS NEEDED FOR CHEST PAIN.DO NOT EXCEED A TOTAL OF 3 DOSES IN 15 MINUTES 25 tablet 6   Polyvinyl Alcohol-Povidone (REFRESH OP) Place 1 drop into both eyes 3 (three) times daily as needed (dry eyes).     rosuvastatin (CRESTOR) 40 MG tablet Take 1/2 tablet Daily for Cholesterol (Patient taking differently: Take 20 mg by mouth every evening.) 45 tablet 3   apixaban (ELIQUIS) 5 MG TABS tablet Take 1 tablet (5 mg total) by mouth 2 (two) times daily. 60 tablet 0   No current facility-administered medications for this encounter.    No Known Allergies  Social History   Socioeconomic History   Marital status: Married    Spouse name: Not on file   Number of children: Not on file   Years of education: Not on file   Highest education level: Not on file  Occupational History   Not on file  Tobacco Use   Smoking status: Former    Packs/day: 2.00    Years: 10.00    Pack years: 20.00    Types: Cigarettes    Quit date: 09/20/1981    Years since  quitting: 39.9   Smokeless tobacco: Never  Vaping Use   Vaping Use: Never used  Substance and Sexual Activity   Alcohol use: No    Comment: pt states he has stopped drinking alcohol   Drug use: No   Sexual activity: Not on file  Other Topics Concern   Not on file  Social History Narrative   Not on file   Social Determinants of Health   Financial Resource Strain: Not on file  Food Insecurity: Not on file  Transportation Needs: Not on file  Physical Activity: Not on file  Stress: Not on file  Social Connections: Not on file  Intimate Partner Violence: Not on file    Family History  Problem Relation Age of Onset   Heart attack Mother    Heart disease Mother    Heart  failure Father    Heart disease Father     ROS- All systems are reviewed and negative except as per the HPI above  Physical Exam: Vitals:   08/21/21 0926  BP: 136/70  Pulse: (!) 57  Weight: 92.4 kg  Height: 5\' 9"  (1.753 m)    Wt Readings from Last 3 Encounters:  08/21/21 92.4 kg  08/07/21 90.7 kg  07/30/21 92.5 kg    Labs: Lab Results  Component Value Date   NA 141 07/30/2021   K 3.9 07/30/2021   CL 109 07/30/2021   CO2 25 07/30/2021   GLUCOSE 78 07/30/2021   BUN 13 07/30/2021   CREATININE 0.99 07/30/2021   CALCIUM 9.1 07/30/2021   MG 2.2 03/05/2021   Lab Results  Component Value Date   INR 1.1 (H) 11/16/2013   Lab Results  Component Value Date   CHOL 137 03/05/2021   HDL 64 03/05/2021   LDLCALC 57 03/05/2021   TRIG 81 03/05/2021    GEN- The patient is a well appearing obese elderly male, alert and oriented x 3 today.   HEENT-head normocephalic, atraumatic, sclera clear, conjunctiva pink, hearing intact, trachea midline. Lungs- Clear to ausculation bilaterally, normal work of breathing Heart- regular rate and rhythm, no murmurs, rubs or gallops  GI- soft, NT, ND, + BS Extremities- no clubbing, cyanosis, or edema MS- no significant deformity or atrophy Skin- no rash or  lesion Psych- euthymic mood, full affect Neuro- strength and sensation are intact   EKG-Vent. rate 57 BPM PR interval 172 ms QRS duration 90 ms QT/QTcB 444/432 ms P-R-T axes 60 23 36 Sinus bradycardia Otherwise normal ECG When compared with ECG of 07-Aug-2021 13:03, PREVIOUS ECG IS PRESENT    Assessment and Plan: 1. Persistent afib  S/p ablation 2014 and 2020 Recent successful cardioversion 1/18 and remains in SR  Continue diltiazem 180 mg daily   2. CHA2DS2VASc score of 4 Continue xarelto 20 mg daily until finished with current RX Then can switch to eliquis 5 mg bid for cost purposes  Explained to pt how to switch over without any missed doses   3. HTN Stable, no changes today.  4. OSA Followed by Dr Maxwell Caul  5. CAD No anginal symptoms.    Follow up in the AF as needed   Butch Penny C. Deborha Moseley, [facility] [address] [place], [place] [address] [phone]

## 2021-09-03 NOTE — Patient Instructions (Signed)

## 2021-09-03 NOTE — Progress Notes (Signed)
Annual  Screening/Preventative Visit  & Comprehensive Evaluation & Examination  Future Appointments  Date Time Provider Department  09/04/2021  3:00 PM [name], MD [facility]  11/29/2021                Wellness 10:30 AM Liane Comber, NP [facility]  09/10/2022  3:00 PM [name], MD [facility]            This very nice 76 y.o.   MWM presents for a Screening /Preventative Visit & comprehensive evaluation and management of multiple medical co-morbidities.  Patient has been followed for HTN, HLD, Prediabetes and Vitamin D Deficiency.       HTN predates since  1996. Patient's BP has been controlled at home.  Today's BP is at goal -  130/70.  In 2015, patient had PCA/Stenting  & rotator ablation.  Patient has long standing hx/o pAfib s/p multiple  ablations ( s/p 8 CV & 3 ablations ). Patient is CHADsVasc 3 and is on Xarelto but is being switched to Eliquis for better insurance coverage .  Last EKG showed NSR after CV on Jan 18. Patient denies any cardiac symptoms as chest pain, palpitations, shortness of breath, dizziness or ankle swelling.       Patient's hyperlipidemia is controlled with diet and Rosuvastatin. Patient denies myalgias or other medication SE's. Last lipids were at goal :  Lab Results  Component Value Date   CHOL 137 03/05/2021   HDL 64 03/05/2021   LDLCALC 57 03/05/2021   TRIG 81 03/05/2021   CHOLHDL 2.1 03/05/2021         Patient has hx/o prediabetes (A1c 5.7% /2011 & 5.9% /2013) and patient denies reactive hypoglycemic symptoms, visual blurring, diabetic polys or paresthesias. Last A1c was at goal :   Lab Results  Component Value Date   HGBA1C 5.3 03/05/2021          Finally, patient has history of Vitamin D Deficiency ("27" /2008) and last vitamin D was at goal :   Lab Results  Component Value Date   VD25OH 94 03/05/2021     Current Outpatient Medications on File Prior to Visit  Medication Sig   ELIQUIS 5 MG TABS tablet Take 1 tablet  2   times daily.   bisoprolol-hctz 10-6.25 MG tablet Take 1 tablet  every evening.   VITAMIN D 5000 u Take 10,000 units Daily   diltiazem CD 180 MG 2 Take 1 capsule  daily   ezetimibe  10 MG tablet Take 1 tablet  daily   Multiple Vitamin  Take 1 tablet Daily   NITROSTAT 0.4 MG SL tablet AS NEEDED FOR CHEST PAIN   rosuvastatin  40 MG tablet Take 1/2 tablet Daily     No Known Allergies   Past Medical History:  Diagnosis Date   Bladder cancer ([place])    Hearing loss of both ears    History of colon polyps    BENIGN   Hyperlipidemia    Hypertension    Mitral regurgitation    OSA on CPAP    PAD (peripheral artery disease) ([facility])    ABI--  RIGHT SIDE NORMAL LEFT SIDE MODERATELY REDUCED W/ DISTAL LEFT SFA STENOSIS//  MILD CLAUDICATION   PAF (paroxysmal atrial fibrillation) ([place])     a. s/p PVI 2014; b. s/p redo PVI and CTI 01/2015   Vitamin D deficiency      Health Maintenance  Topic Date Due   Zoster Vaccines- Shingrix (1 of 2) Never done  COVID-19 Vaccine (3 - Pfizer risk series) 10/13/2019   COLONOSCOPY  06/23/2022   TETANUS/TDAP  09/23/2023   Pneumonia Vaccine 63+ Years old  Completed   INFLUENZA VACCINE  Completed   Hepatitis C Screening  Completed   HPV VACCINES  Aged Out     Immunization History  Administered Date(s) Administered   DT (Pediatric) 07/22/2003, 12/28/2013   Influenza Split 04/14/2019   Influenza Whole 05/19/2013   Influenza, High Dose   0/28/2019, 04/14/2019, 05/28/2021   Influenza,inj,Quad   05/09/2014   Influenza  05/14/2015, 05/01/2016, 05/17/2018   PFIZER  SARS-COV-2 Vacc 08/18/2019, 09/15/2019   Pneumococcal  -13 07/11/2014   Pneumococcal  -23 12/04/2009, 06/10/2016   Tdap 09/22/2013    Last Colon - Last Colon -  04/22/2016 and most recently in 06/2019 - Dr Rosalie Gums @[facility]  and recc a 3 year f/u due Dec 2023.   Past Surgical History:  Procedure Laterality Date   ABLATION OF DYSRHYTHMIC FOCUS  01/26/2015   ATRIAL FIBRILLATION ABLATION N/A  07/27/2012   Procedure: ATRIAL FIBRILLATION ABLATION;  [name], MD   ATRIAL FIBRILLATION ABLATION N/A 08/25/2018   ATRIAL FIBRILLATION ABLATION;  [name], MD   CARDIAC ELECTROPHYSIOLOGY STUDY AND ABLATION  07-27-2012  DR [name]   SUCCESSFUL ABLATION OF A-FIB   CARDIOVERSION  08/06/2012   CARDIOVERSION;   [name], MD   CARDIOVERSION N/A 08/25/2014   CARDIOVERSION;Dalton Claris Gladden, MD   CARDIOVERSION N/A 11/13/2014   PCARDIOVERSION; Sueanne Margarita, MD   CARDIOVERSION N/A 12/28/2014   CARDIOVERSION;  Surgeon: [name], MD   CARDIOVERSION N/A 01/30/2017   CARDIOVERSION;  Josue Hector, MD;   CARDIOVERSION N/A 04/06/2017   CARDIOVERSION; Dorothy Spark, MD   CARDIOVERSION N/A 05/25/2018   CARDIOVERSION;  Surgeon: Skeet Latch, MD;  Location: [facility] ENDOSCOPY;  Service: Cardiovascular;  Laterality: N/A;   CARDIOVERSION N/A 08/09/2018   CARDIOVERSION;  Surgeon: Buford Dresser, MD;  Location: [facility] ENDOSCOPY;     CARDIOVERSION N/A 07/04/2019   CARDIOVERSION; Dorothy Spark, MD   CARDIOVERSION N/A 09/23/2019   CARDIOVERSION; Dorothy Spark, MD   CARDIOVERSION N/A 08/07/2021   Procedure: CARDIOVERSION;  Surgeon: Sueanne Margarita, MD;  Location: [facility] ENDOSCOPY;  Service: Cardiovascular;  Laterality: N/A;   CATARACT EXTRACTION W/ INTRAOCULAR LENS  IMPLANT, BILATERAL     COLONOSCOPY WITH PROPOFOL N/A 02/13/2015   COLONOSCOPY WITH PROPOFOL;  Garlan Fair, MD   CYSTOSCOPY W/ RETROGRADES  04/12/2013   CYSTOSCOPY WITH RETROGRADE PYELOGRAM;   [name], MD   CYSTOSCOPY WITH RETROGRADE PYELOGRAM, URETEROSCOPY AND STENT PLACEMENT  03/06/2021   CYSTOSCOPY WITH BILATERAL RETROGRADE PYELOGRAM, LEFT URETEROSCOPY AND STENT PLACEMENT;  [name], MD   ELECTROPHYSIOLOGIC STUDY N/A 01/26/2015   Atrial Fibrillation Ablation;   [name], MD   HOLMIUM LASER APPLICATION Left 0/98/1191   Procedure: HOLMIUM LASER APPLICATION;  Surgeon: [name], MD;   Location: [facility];  Service: Urology;  Laterality: Left;   JOINT REPLACEMENT     LEFT HEART CATHETERIZATION WITH CORONARY ANGIOGRAM N/A 11/24/2013   LEFT HEART CATHETERIZATION WITH CORONARY ANGIOGRAM;  [name], MD   PERCUTANEOUS CORONARY ROTOBLATOR INTERVENTION (PCI-R) N/A 11/28/2013   PERCUTANEOUS CORONARY ROTOBLATOR INTERVENTION (PCI-R);[name], MD   PERCUTANEOUS CORONARY STENT INTERVENTION (PCI-S)  11/24/2013   PERCUTANEOUS CORONARY STENT INTERVENTION (PCI-S);  [name], MD;   TEE WITHOUT CARDIOVERSION  07/26/2012   \TRANSESOPHAGEAL ECHOCARDIOGRAM (TEE);  Surgeon: [name], MD;  Location[facility] ENDOSCOPY;  Service: Cardiovascular;  Laterality: N/A;   TEE WITHOUT CARDIOVERSION N/A 08/25/2014   TRANSESOPHAGEAL ECHOCARDIOGRAM (TEE);  Larey Dresser, [place] ENDOSCOPY   TEE WITHOUT CARDIOVERSION  01/26/2015   Procedure: Transesophageal Echocardiogram (Tee); [name], MD   TEE WITHOUT CARDIOVERSION N/A 07/04/2019   TRANSESOPHAGEAL ECHOCARDIOGRAM (TEE);  Dorothy Spark, MD   TONSILLECTOMY  age 71   TOTAL HIP ARTHROPLASTY  07/25/2011   Procedure: TOTAL HIP ARTHROPLASTY ANTERIOR APPROACH,Right;  Mcarthur Rossetti   TOTAL HIP ARTHROPLASTY Left 02/08/2010   TRANSURETHRAL RESECTION OF BLADDER TUMOR WITH GYRUS (TURBT-GYRUS) N/A 04/12/2013   TRANSURETHRAL RESECTION OF BLADDER TUMOR WITH GYRUS (TURBT-GYRUS);  [name], MD     Family History  Problem Relation Age of Onset   Heart attack Mother    Heart disease Mother    Heart failure Father    Heart disease Father      Social History   Tobacco Use   Smoking status: Former    Packs/day: 2.00    Years: 10.00    Pack years: 20.00    Types: Cigarettes    Quit date: 09/20/1981    Years since quitting: 39.9   Smokeless tobacco: Never  Vaping Use   Vaping Use: Never used  Substance Use Topics   Alcohol use: No    Comment: pt states he has stopped drinking  alcohol   Drug use: No      ROS Constitutional: Denies fever, chills, weight loss/gain, headaches, insomnia,  night sweats or change in appetite. Does c/o fatigue. Eyes: Denies redness, blurred vision, diplopia, discharge, itchy or watery eyes.  ENT: Denies discharge, congestion, post nasal drip, epistaxis, sore throat, earache, hearing loss, dental pain, Tinnitus, Vertigo, Sinus pain or snoring.  Cardio: Denies chest pain, palpitations, irregular heartbeat, syncope, dyspnea, diaphoresis, orthopnea, PND, claudication or edema Respiratory: denies cough, dyspnea, DOE, pleurisy, hoarseness, laryngitis or wheezing.  Gastrointestinal: Denies dysphagia, heartburn, reflux, water brash, pain, cramps, nausea, vomiting, bloating, diarrhea, constipation, hematemesis, melena, hematochezia, jaundice or hemorrhoids Genitourinary: Denies dysuria, frequency, urgency, nocturia, hesitancy, discharge, hematuria or flank pain Musculoskeletal: Denies arthralgia, myalgia, stiffness, Jt. Swelling, pain, limp or strain/sprain. Denies Falls. Skin: Denies puritis, rash, hives, warts, acne, eczema or change in skin lesion Neuro: No weakness, tremor, incoordination, spasms, paresthesia or pain Psychiatric: Denies confusion, memory loss or sensory loss. Denies Depression. Endocrine: Denies change in weight, skin, hair change, nocturia, and paresthesia, diabetic polys, visual blurring or hyper / hypo glycemic episodes.  Heme/Lymph: No excessive bleeding, bruising or enlarged lymph nodes.   Physical Exam  BP 130/70    Pulse 61    Temp 97.9 F (36.6 C)    Resp 16    Ht 5\' 9"  (1.753 m)    Wt 203 lb (92.1 kg)    SpO2 94%    BMI 29.98 kg/m   General Appearance: Well nourished and well groomed and in no apparent distress.  Eyes: PERRLA, EOMs, conjunctiva no swelling or erythema, normal fundi and vessels. Sinuses: No frontal/maxillary tenderness ENT/Mouth: EACs patent / TMs  nl. Nares clear without erythema, swelling,  mucoid exudates. Oral hygiene is good. No erythema, swelling, or exudate. Tongue normal, non-obstructing. Tonsils not swollen or erythematous. Hearing normal.  Neck: Supple, thyroid not palpable. No bruits, nodes or JVD. Respiratory: Respiratory effort normal.  BS equal and clear bilateral without rales, rhonci, wheezing or stridor. Cardio: Heart sounds are normal with regular rate and rhythm and no murmurs, rubs or gallops. Peripheral pulses are normal and equal bilaterally without  edema. No aortic or femoral bruits. Chest: symmetric with normal excursions and percussion.  Abdomen: Soft, with Nl bowel sounds. Nontender, no guarding, rebound, hernias, masses, or organomegaly.  Lymphatics: Non tender without lymphadenopathy.  Musculoskeletal: Full ROM all peripheral extremities, joint stability, 5/5 strength, and normal gait. Skin: Warm and dry without rashes, lesions, cyanosis, clubbing or  ecchymosis.  Neuro: Cranial nerves intact, reflexes equal bilaterally. Normal muscle tone, no cerebellar symptoms. Sensation intact.  Pysch: Alert and oriented X 3 with normal affect, insight and judgment appropriate.   Assessment and Plan  1. Annual Preventative/Screening Exam    2. Essential hypertension  - [place], RETROPERITNL ABD,  LTD - Urinalysis, Routine w reflex microscopic - Microalbumin / creatinine urine ratio - CBC with Differential/Platelet - COMPLETE METABOLIC PANEL WITH GFR - Magnesium - TSH  3. Hyperlipidemia, mixed  - [place], RETROPERITNL ABD,  LTD - Lipid panel - TSH  4. Abnormal glucose  - [place], RETROPERITNL ABD,  LTD - Hemoglobin A1c - Insulin, random  5. Vitamin D deficiency  - VITAMIN D 25 Hydroxy   6. Paroxysmal atrial fibrillation (HCC)  - TSH  7. Atherosclerosis of native coronary artery without angina  pectoris, unspecified whether native or transplanted heart  - [place], RETROPERITNL ABD,  LTD - Lipid panel  8. Aortic atherosclerosis ([place]) by Abd CT Scan in  2015  - [place], RETROPERITNL ABD,  LTD - Lipid panel  9. OSA on CPAP   10. BPH with obstruction/lower urinary tract symptoms  - PSA  11. Screening for colorectal cancer  - POC Hemoccult Bld/Stl   12. Prostate cancer screening  - PSA  13. Screening for ischemic heart disease   14. FHx: heart disease  - [place], RETROPERITNL ABD,  LTD  15. Former smoker  - [place], RETROPERITNL ABD,  LTD  16. Screening for AAA (aortic abdominal aneurysm)  - [place], RETROPERITNL ABD,  LTD  17. Medication management  - Urinalysis, Routine w reflex microscopic - Microalbumin / creatinine urine ratio - CBC with Differential/Platelet - COMPLETE METABOLIC PANEL WITH GFR - Magnesium - Lipid panel - TSH - Hemoglobin A1c - Insulin, random - VITAMIN D 25 Hydroxy           Patient was counseled in prudent diet, weight control to achieve/maintain BMI less than 25, BP monitoring, regular exercise and medications as discussed.  Discussed med effects and SE's. Routine screening labs and tests as requested with regular follow-up as recommended. Over 40 minutes of exam, counseling, chart review and high complex critical decision making was performed   Kirtland Bouchard, MD

## 2021-09-04 ENCOUNTER — Encounter: Payer: Self-pay | Admitting: Internal Medicine

## 2021-09-04 ENCOUNTER — Ambulatory Visit (INDEPENDENT_AMBULATORY_CARE_PROVIDER_SITE_OTHER): Payer: Medicare Other | Admitting: Internal Medicine

## 2021-09-04 ENCOUNTER — Other Ambulatory Visit: Payer: Self-pay

## 2021-09-04 VITALS — BP 130/70 | HR 61 | Temp 97.9°F | Resp 16 | Ht 69.0 in | Wt 203.0 lb

## 2021-09-04 DIAGNOSIS — Z1211 Encounter for screening for malignant neoplasm of colon: Secondary | ICD-10-CM

## 2021-09-04 DIAGNOSIS — Z136 Encounter for screening for cardiovascular disorders: Secondary | ICD-10-CM

## 2021-09-04 DIAGNOSIS — N138 Other obstructive and reflux uropathy: Secondary | ICD-10-CM

## 2021-09-04 DIAGNOSIS — Z0001 Encounter for general adult medical examination with abnormal findings: Secondary | ICD-10-CM

## 2021-09-04 DIAGNOSIS — I739 Peripheral vascular disease, unspecified: Secondary | ICD-10-CM

## 2021-09-04 DIAGNOSIS — Z8249 Family history of ischemic heart disease and other diseases of the circulatory system: Secondary | ICD-10-CM

## 2021-09-04 DIAGNOSIS — Z0000 Encounter for general adult medical examination without abnormal findings: Secondary | ICD-10-CM | POA: Diagnosis not present

## 2021-09-04 DIAGNOSIS — Z87891 Personal history of nicotine dependence: Secondary | ICD-10-CM

## 2021-09-04 DIAGNOSIS — Z125 Encounter for screening for malignant neoplasm of prostate: Secondary | ICD-10-CM

## 2021-09-04 DIAGNOSIS — I2510 Atherosclerotic heart disease of native coronary artery without angina pectoris: Secondary | ICD-10-CM

## 2021-09-04 DIAGNOSIS — I700 Atherosclerosis of aorta: Secondary | ICD-10-CM | POA: Diagnosis not present

## 2021-09-04 DIAGNOSIS — Z79899 Other long term (current) drug therapy: Secondary | ICD-10-CM

## 2021-09-04 DIAGNOSIS — I10 Essential (primary) hypertension: Secondary | ICD-10-CM

## 2021-09-04 DIAGNOSIS — E559 Vitamin D deficiency, unspecified: Secondary | ICD-10-CM

## 2021-09-04 DIAGNOSIS — G4733 Obstructive sleep apnea (adult) (pediatric): Secondary | ICD-10-CM

## 2021-09-04 DIAGNOSIS — E782 Mixed hyperlipidemia: Secondary | ICD-10-CM

## 2021-09-04 DIAGNOSIS — I480 Paroxysmal atrial fibrillation: Secondary | ICD-10-CM

## 2021-09-04 DIAGNOSIS — R7309 Other abnormal glucose: Secondary | ICD-10-CM

## 2021-09-05 LAB — COMPLETE METABOLIC PANEL WITH GFR
AG Ratio: 1.9 (calc) (ref 1.0–2.5)
ALT: 23 U/L (ref 9–46)
AST: 21 U/L (ref 10–35)
Albumin: 4.3 g/dL (ref 3.6–5.1)
Alkaline phosphatase (APISO): 55 U/L (ref 35–144)
BUN: 15 mg/dL (ref 7–25)
CO2: 26 mmol/L (ref 20–32)
Calcium: 9.3 mg/dL (ref 8.6–10.3)
Chloride: 107 mmol/L (ref 98–110)
Creat: 0.91 mg/dL (ref 0.70–1.28)
Globulin: 2.3 g/dL (calc) (ref 1.9–3.7)
Glucose, Bld: 80 mg/dL (ref 65–99)
Potassium: 3.8 mmol/L (ref 3.5–5.3)
Sodium: 142 mmol/L (ref 135–146)
Total Bilirubin: 2.1 mg/dL — ABNORMAL HIGH (ref 0.2–1.2)
Total Protein: 6.6 g/dL (ref 6.1–8.1)
eGFR: 87 mL/min/{1.73_m2} (ref >=60)

## 2021-09-05 LAB — CBC WITH DIFFERENTIAL/PLATELET
Absolute Monocytes: 639 cells/uL (ref 200–950)
Basophils Absolute: 50 cells/uL (ref 0–200)
Basophils Relative: 0.7 %
Eosinophils Absolute: 107 cells/uL (ref 15–500)
Eosinophils Relative: 1.5 %
HCT: 44.8 % (ref 38.5–50.0)
Hemoglobin: 15.2 g/dL (ref 13.2–17.1)
Lymphs Abs: 1413 cells/uL (ref 850–3900)
MCH: 30 pg (ref 27.0–33.0)
MCHC: 33.9 g/dL (ref 32.0–36.0)
MCV: 88.5 fL (ref 80.0–100.0)
MPV: 9.9 fL (ref 7.5–12.5)
Monocytes Relative: 9 %
Neutro Abs: 4892 cells/uL (ref 1500–7800)
Neutrophils Relative %: 68.9 %
Platelets: 201 10*3/uL (ref 140–400)
RBC: 5.06 10*6/uL (ref 4.20–5.80)
RDW: 13.1 % (ref 11.0–15.0)
Total Lymphocyte: 19.9 %
WBC: 7.1 10*3/uL (ref 3.8–10.8)

## 2021-09-05 LAB — MICROSCOPIC MESSAGE

## 2021-09-05 LAB — URINALYSIS, ROUTINE W REFLEX MICROSCOPIC
Bacteria, UA: NONE SEEN /HPF
Bilirubin Urine: NEGATIVE
Glucose, UA: NEGATIVE
Hgb urine dipstick: NEGATIVE
Hyaline Cast: NONE SEEN /LPF
Ketones, ur: NEGATIVE
Nitrite: NEGATIVE
Protein, ur: NEGATIVE
Specific Gravity, Urine: 1.025 (ref 1.001–1.035)
Squamous Epithelial / HPF: NONE SEEN /HPF (ref <=5)
pH: 5.5 (ref 5.0–8.0)

## 2021-09-05 LAB — MICROALBUMIN / CREATININE URINE RATIO
Creatinine, Urine: 237 mg/dL (ref 20–320)
Microalb Creat Ratio: 4 mcg/mg creat (ref <30)
Microalb, Ur: 0.9 mg/dL

## 2021-09-05 LAB — INSULIN, RANDOM: Insulin: 8.9 u[IU]/mL

## 2021-09-05 LAB — PSA: PSA: 0.75 ng/mL (ref <=4.00)

## 2021-09-05 LAB — HEMOGLOBIN A1C
Hgb A1c MFr Bld: 5.5 % of total Hgb (ref <5.7)
Mean Plasma Glucose: 111 mg/dL
eAG (mmol/L): 6.2 mmol/L

## 2021-09-05 LAB — LIPID PANEL
Cholesterol: 119 mg/dL (ref <200)
HDL: 56 mg/dL (ref >=40)
LDL Cholesterol (Calc): 49 mg/dL (calc)
Non-HDL Cholesterol (Calc): 63 mg/dL (calc) (ref <130)
Total CHOL/HDL Ratio: 2.1 (calc) (ref <5.0)
Triglycerides: 67 mg/dL (ref <150)

## 2021-09-05 LAB — VITAMIN D 25 HYDROXY (VIT D DEFICIENCY, FRACTURES): Vit D, 25-Hydroxy: 92 ng/mL (ref 30–100)

## 2021-09-05 LAB — MAGNESIUM: Magnesium: 1.9 mg/dL (ref 1.5–2.5)

## 2021-09-05 LAB — TSH: TSH: 1.39 mIU/L (ref 0.40–4.50)

## 2021-09-06 NOTE — Progress Notes (Signed)
=============================================================== °-   Test results slightly outside the reference range are not unusual. If there is anything important, I will review this with you,  otherwise it is considered normal test values.  If you have further questions,  please do not hesitate to contact me at the office or via My Chart.  =============================================================== ===============================================================  -  PSA - Low - Great ! =============================================================== ===============================================================  -  Total Chol = 119   & LDL Chol = 49     -  both   Excellent   - Very low risk for Heart Attack  / Stroke ============================================================ ============================================================  -  A1c  - Normal - No Diabetes  - Great   !  =============================================================== ===============================================================  -  Vitamin D = 92  - Excellent   - Please keep dose same  =============================================================== ===============================================================  -  All Else - CBC - Kidneys - Electrolytes - Liver - Magnesium & Thyroid    - all  Normal / OK =============================================================== ===============================================================  -  Keep up the [place] Work  !  =============================================================== ===============================================================

## 2021-09-12 ENCOUNTER — Ambulatory Visit (HOSPITAL_BASED_OUTPATIENT_CLINIC_OR_DEPARTMENT_OTHER): Payer: Medicare Other | Admitting: Internal Medicine

## 2021-09-16 ENCOUNTER — Other Ambulatory Visit: Payer: Self-pay | Admitting: *Deleted

## 2021-09-16 DIAGNOSIS — I480 Paroxysmal atrial fibrillation: Secondary | ICD-10-CM

## 2021-09-16 DIAGNOSIS — I10 Essential (primary) hypertension: Secondary | ICD-10-CM

## 2021-09-16 MED ORDER — DILTIAZEM HCL ER COATED BEADS 180 MG PO CP24: 180.0000 mg | ORAL_CAPSULE | Freq: Every day | ORAL | 3 refills | Status: DC

## 2021-09-17 ENCOUNTER — Other Ambulatory Visit (HOSPITAL_COMMUNITY): Payer: Self-pay | Admitting: *Deleted

## 2021-09-17 DIAGNOSIS — I480 Paroxysmal atrial fibrillation: Secondary | ICD-10-CM

## 2021-09-17 DIAGNOSIS — I10 Essential (primary) hypertension: Secondary | ICD-10-CM

## 2021-09-17 MED ORDER — DILTIAZEM HCL ER COATED BEADS 180 MG PO CP24: 180.0000 mg | ORAL_CAPSULE | Freq: Every day | ORAL | 3 refills | Status: DC

## 2021-09-19 ENCOUNTER — Other Ambulatory Visit: Payer: Self-pay

## 2021-09-19 MED ORDER — ROSUVASTATIN CALCIUM 40 MG PO TABS: ORAL_TABLET | ORAL | 3 refills | Status: DC

## 2021-10-07 ENCOUNTER — Other Ambulatory Visit: Payer: Self-pay

## 2021-10-07 ENCOUNTER — Ambulatory Visit (HOSPITAL_COMMUNITY)
Admission: RE | Admit: 2021-10-07 | Discharge: 2021-10-07 | Disposition: A | Discharge status: Home / Self Care | Payer: Medicare Other | Source: Ambulatory Visit | Attending: Physician Assistant | Admitting: Physician Assistant

## 2021-10-07 VITALS — BP 122/68 | HR 61 | Ht 69.0 in | Wt 202.2 lb

## 2021-10-07 DIAGNOSIS — Z7901 Long term (current) use of anticoagulants: Secondary | ICD-10-CM | POA: Insufficient documentation

## 2021-10-07 DIAGNOSIS — I2510 Atherosclerotic heart disease of native coronary artery without angina pectoris: Secondary | ICD-10-CM | POA: Insufficient documentation

## 2021-10-07 DIAGNOSIS — G4733 Obstructive sleep apnea (adult) (pediatric): Secondary | ICD-10-CM | POA: Insufficient documentation

## 2021-10-07 DIAGNOSIS — I4819 Other persistent atrial fibrillation: Secondary | ICD-10-CM | POA: Insufficient documentation

## 2021-10-07 DIAGNOSIS — I10 Essential (primary) hypertension: Secondary | ICD-10-CM | POA: Diagnosis not present

## 2021-10-07 DIAGNOSIS — D6869 Other thrombophilia: Secondary | ICD-10-CM

## 2021-10-07 NOTE — Progress Notes (Signed)
? ?Primary Care Physician: [name], MD ?Referring Physician: Dr. [name]  ? ? ?Damon [name] is a 76 y.o. male with a h/o paroxysmal afib that is in the clinic for going back into afib sometime over the last couple of days.He had an colonoscopy last Friday which may have been  the trigger. He came off anticoagulation for 2 days prior. He is rate controled today. ? ?F/u in afib clinic, 07/11/19, after successful  cardioversion and continues in SR. He c/o of some burning/nausea after the cardioversion but states it was actually going on before then as he had stopped Vit C thinking he was not tolerating. He has seen his PCP and was started on PPI and symptoms  are  improving.  ? ?F/u in afib clinic, 09/19/19, as pt has gone back into  afib and is requesting cardioversion.He had successful cardioversion in December 2020. He feels the trigger was the covid shot. The afib started the next day after the shot. He is rate controlled. He has had 3 ablations in the past. He was out of town when he went into afib. Low dose BB was added but he remains in afib  ? ?F/u in afib clinic, 10/03/19. He had successful cardioversion and remains in SR. No further episodes of afib. EKG shows SR at 57 bpm. He still feels second covid shot may have done triggered afib.  ? ?Follow up in the AF clinic 07/30/21. He reports that he felt he was in afib on 07/25/21 with palpitations and fatigue. He was visiting family in [place] and presented to the ED there. Since he was stable and rate controlled, he was not cardioverted. He remains in rate controlled afib today. No missed doses of anticoagulation.  ? ?F/u in afib clinic, 08/21/21. He had a successful cardioversion and remains  in afib. He says he went 2 yrs since his last cardioversion. He would like to switch over to Eliquis for the difference in cost and will send  him in 30 day free and then 90 day mail order.  ? ?Follow up in the AF clinic 10/07/21. Patient reports that he woke in the  middle of the night 10/05/21 with tachypalpitations. There were no specific triggers that he could identify. His symptoms resolved at about 10 AM this morning. No bleeding issues on anticoagulation.  ? ?Today, he denies symptoms of chest pain, shortness of breath, orthopnea, PND, lower extremity edema, dizziness, presyncope, syncope, or neurologic sequela. The patient is tolerating medications without difficulties and is otherwise without complaint today.  ? ?Past Medical History:  ?Diagnosis Date  ? Bladder cancer ([place])   ? Hearing loss of both ears   ? History of colon polyps   ? BENIGN  ? Hyperlipidemia   ? Hypertension   ? Mitral regurgitation   ? OSA on CPAP   ? PAD (peripheral artery disease) ([place])   ? ABI--  RIGHT SIDE NORMAL LEFT SIDE MODERATELY REDUCED W/ DISTAL LEFT SFA STENOSIS//  MILD CALDICATION  ? PAF (paroxysmal atrial fibrillation) ([place])    ? a. s/p PVI 2014; b. s/p redo PVI and CTI 01/2015  ? Vitamin D deficiency   ? ?Past Surgical History:  ?Procedure Laterality Date  ? ABLATION OF DYSRHYTHMIC FOCUS  01/26/2015  ? ATRIAL FIBRILLATION ABLATION N/A 07/27/2012  ? Procedure: ATRIAL FIBRILLATION ABLATION;  Surgeon: [name], MD;  Location: [facility] CATH LAB;  Service: Cardiovascular;  Laterality: N/A;  ? ATRIAL FIBRILLATION ABLATION N/A 08/25/2018  ? Procedure: ATRIAL FIBRILLATION ABLATION;  Surgeon: [name], MD;  Location: [place] CV LAB;  Service: Cardiovascular;  Laterality: N/A;  ? CARDIAC ELECTROPHYSIOLOGY STUDY AND ABLATION  07-27-2012  DR [name]  ? SUCCESSFUL ABLATION OF A-FIB  ? CARDIOVERSION  08/06/2012  ? Procedure: CARDIOVERSION;  Surgeon: [name], MD;  Location: [place];  Service: Cardiovascular;  Laterality: N/A;  ? CARDIOVERSION N/A 08/25/2014  ? Procedure: CARDIOVERSION;  Surgeon: Larey Dresser, MD;  Location: [place];  Service: Cardiovascular;  Laterality: N/A;  ? CARDIOVERSION N/A 11/13/2014  ? Procedure: CARDIOVERSION;  Surgeon: Sueanne Margarita, MD;  Location: [place];  Service: Cardiovascular;  Laterality: N/A;  ? CARDIOVERSION N/A 12/28/2014  ? Procedure: CARDIOVERSION;  Surgeon: [name], MD;  Location: [place];  Service: Cardiovascular;  Laterality: N/A;  ? CARDIOVERSION N/A 01/30/2017  ? Procedure: CARDIOVERSION;  Surgeon: Josue Hector, MD;  Location: [facility] ENDOSCOPY;  Service: Cardiovascular;  Laterality: N/A;  ? CARDIOVERSION N/A 04/06/2017  ? Procedure: CARDIOVERSION;  Surgeon: Dorothy Spark, MD;  Location: [facility] ENDOSCOPY;  Service: Cardiovascular;  Laterality: N/A;  ? CARDIOVERSION N/A 05/25/2018  ? Procedure: CARDIOVERSION;  Surgeon: Skeet Latch, MD;  Location: [place];  Service: Cardiovascular;  Laterality: N/A;  ? CARDIOVERSION N/A 08/09/2018  ? Procedure: CARDIOVERSION;  Surgeon: Buford Dresser, MD;  Location: [place];  Service: Cardiovascular;  Laterality: N/A;  ? CARDIOVERSION N/A 07/04/2019  ? Procedure: CARDIOVERSION;  Surgeon: Dorothy Spark, MD;  Location: [place];  Service: Cardiovascular;  Laterality: N/A;  ? CARDIOVERSION N/A 09/23/2019  ? Procedure: CARDIOVERSION;  Surgeon: Dorothy Spark, MD;  Location: [place];  Service: Cardiovascular;  Laterality: N/A;  ? CARDIOVERSION N/A 08/07/2021  ? Procedure: CARDIOVERSION;  Surgeon: Sueanne Margarita, MD;  Location: [facility] ENDOSCOPY;  Service: Cardiovascular;  Laterality: N/A;  ? CATARACT EXTRACTION W/ INTRAOCULAR LENS  IMPLANT, BILATERAL    ? COLONOSCOPY WITH PROPOFOL N/A 02/13/2015  ? Procedure: COLONOSCOPY WITH PROPOFOL;  Surgeon: Garlan Fair, MD;  Location: [facility] ENDOSCOPY;  Service: Endoscopy;  Laterality: N/A;  ? CYSTOSCOPY W/ RETROGRADES Bilateral 04/12/2013  ? Procedure: CYSTOSCOPY WITH RETROGRADE PYELOGRAM;  Surgeon: [name], MD;  Location: [facility];  Service: Urology;  Laterality: Bilateral;  ? CYSTOSCOPY WITH RETROGRADE PYELOGRAM, URETEROSCOPY AND STENT PLACEMENT  03/06/2021  ? Procedure: CYSTOSCOPY WITH BILATERAL  RETROGRADE PYELOGRAM, LEFT URETEROSCOPY AND STENT PLACEMENT;  Surgeon: [name], MD;  Location: [facility];  Service: Urology;;  1 HR  ? ELECTROPHYSIOLOGIC STUDY N/A 01/26/2015  ? Procedure: Atrial Fibrillation Ablation;  Surgeon: [name], MD;  Location: [place] CV LAB;  Service: Cardiovascular;  Laterality: N/A;  ? HOLMIUM LASER APPLICATION Left 5/62/1308  ? Procedure: HOLMIUM LASER APPLICATION;  Surgeon: [name], MD;  Location: [facility];  Service: Urology;  Laterality: Left;  ? JOINT REPLACEMENT    ? LEFT HEART CATHETERIZATION WITH CORONARY ANGIOGRAM N/A 11/24/2013  ? Procedure: LEFT HEART CATHETERIZATION WITH CORONARY ANGIOGRAM;  Surgeon: [name], MD;  Location: [facility] CATH LAB;  Service: Cardiovascular;  Laterality: N/A;  ? PERCUTANEOUS CORONARY ROTOBLATOR INTERVENTION (PCI-R) N/A 11/28/2013  ? Procedure: PERCUTANEOUS CORONARY ROTOBLATOR INTERVENTION (PCI-R);  Surgeon: [name], MD;  Location: [facility] CATH LAB;  Service: Cardiovascular;  Laterality: N/A;  ? PERCUTANEOUS CORONARY STENT INTERVENTION (PCI-S)  11/24/2013  ? Procedure: PERCUTANEOUS CORONARY STENT INTERVENTION (PCI-S);  Surgeon: [name], MD;  Location: [facility] CATH LAB;  Service: Cardiovascular;;  ? TEE WITHOUT CARDIOVERSION  07/26/2012  ? Procedure: TRANSESOPHAGEAL  ECHOCARDIOGRAM (TEE);  Surgeon: [name], MD;  Location: [place];  Service: Cardiovascular;  Laterality: N/A;  ? TEE WITHOUT CARDIOVERSION N/A 08/25/2014  ? Procedure: TRANSESOPHAGEAL ECHOCARDIOGRAM (TEE);  Surgeon: Larey Dresser, MD;  Location: [place];  Service: Cardiovascular;  Laterality: N/A;  ? TEE WITHOUT CARDIOVERSION  01/26/2015  ? Procedure: Transesophageal Echocardiogram (Tee);  Surgeon: [name], MD;  Location: [place] CV LAB;  Service: Cardiovascular;;  ? TEE WITHOUT CARDIOVERSION N/A 07/04/2019  ? Procedure: TRANSESOPHAGEAL ECHOCARDIOGRAM (TEE);  Surgeon: Dorothy Spark, MD;  Location: [facility] ENDOSCOPY;  Service: Cardiovascular;  Laterality: N/A;  ? TONSILLECTOMY  age 67  ? TOTAL HIP ARTHROPLASTY  07/25/2011  ? Procedure: TOTAL HIP ARTHROPLASTY ANTERIOR APPROACH;  Renford Dills

## 2021-10-16 ENCOUNTER — Other Ambulatory Visit: Payer: Self-pay

## 2021-10-16 DIAGNOSIS — Z1211 Encounter for screening for malignant neoplasm of colon: Secondary | ICD-10-CM

## 2021-10-16 LAB — POC HEMOCCULT BLD/STL (HOME/3-CARD/SCREEN)
Card #2 Fecal Occult Blod, POC: NEGATIVE
Card #3 Fecal Occult Blood, POC: NEGATIVE
Fecal Occult Blood, POC: NEGATIVE

## 2021-10-17 DIAGNOSIS — Z1212 Encounter for screening for malignant neoplasm of rectum: Secondary | ICD-10-CM | POA: Diagnosis not present

## 2021-10-17 DIAGNOSIS — Z1211 Encounter for screening for malignant neoplasm of colon: Secondary | ICD-10-CM | POA: Diagnosis not present

## 2021-11-04 ENCOUNTER — Other Ambulatory Visit (HOSPITAL_COMMUNITY): Payer: Self-pay | Admitting: *Deleted

## 2021-11-04 MED ORDER — RIVAROXABAN 20 MG PO TABS: 20.0000 mg | ORAL_TABLET | Freq: Every day | ORAL | 2 refills | Status: DC

## 2021-11-07 ENCOUNTER — Other Ambulatory Visit: Payer: Self-pay | Admitting: Internal Medicine

## 2021-11-07 DIAGNOSIS — I2510 Atherosclerotic heart disease of native coronary artery without angina pectoris: Secondary | ICD-10-CM

## 2021-11-07 DIAGNOSIS — I10 Essential (primary) hypertension: Secondary | ICD-10-CM

## 2021-11-07 MED ORDER — BISOPROLOL-HYDROCHLOROTHIAZIDE 10-6.25 MG PO TABS: ORAL_TABLET | ORAL | 3 refills | Status: DC

## 2021-11-08 ENCOUNTER — Other Ambulatory Visit: Payer: Self-pay | Admitting: Internal Medicine

## 2021-11-08 DIAGNOSIS — I2510 Atherosclerotic heart disease of native coronary artery without angina pectoris: Secondary | ICD-10-CM

## 2021-11-08 DIAGNOSIS — I10 Essential (primary) hypertension: Secondary | ICD-10-CM

## 2021-11-08 MED ORDER — BISOPROLOL-HYDROCHLOROTHIAZIDE 10-6.25 MG PO TABS: ORAL_TABLET | ORAL | 3 refills | Status: DC

## 2021-11-29 ENCOUNTER — Ambulatory Visit: Payer: Medicare Other | Admitting: Adult Health

## 2021-12-17 ENCOUNTER — Ambulatory Visit: Payer: Medicare Other | Admitting: Adult Health

## 2021-12-17 ENCOUNTER — Encounter: Payer: Self-pay | Admitting: Adult Health

## 2021-12-17 VITALS — BP 118/68 | HR 60 | Temp 97.7°F | Wt 206.0 lb

## 2021-12-17 DIAGNOSIS — I700 Atherosclerosis of aorta: Secondary | ICD-10-CM

## 2021-12-17 DIAGNOSIS — Z0001 Encounter for general adult medical examination with abnormal findings: Secondary | ICD-10-CM | POA: Diagnosis not present

## 2021-12-17 DIAGNOSIS — Z86010 Personal history of colonic polyps: Secondary | ICD-10-CM

## 2021-12-17 DIAGNOSIS — D6869 Other thrombophilia: Secondary | ICD-10-CM | POA: Diagnosis not present

## 2021-12-17 DIAGNOSIS — E782 Mixed hyperlipidemia: Secondary | ICD-10-CM

## 2021-12-17 DIAGNOSIS — E663 Overweight: Secondary | ICD-10-CM

## 2021-12-17 DIAGNOSIS — G4700 Insomnia, unspecified: Secondary | ICD-10-CM

## 2021-12-17 DIAGNOSIS — G4733 Obstructive sleep apnea (adult) (pediatric): Secondary | ICD-10-CM

## 2021-12-17 DIAGNOSIS — I739 Peripheral vascular disease, unspecified: Secondary | ICD-10-CM | POA: Diagnosis not present

## 2021-12-17 DIAGNOSIS — Z0000 Encounter for general adult medical examination without abnormal findings: Secondary | ICD-10-CM

## 2021-12-17 DIAGNOSIS — R6889 Other general symptoms and signs: Secondary | ICD-10-CM | POA: Diagnosis not present

## 2021-12-17 DIAGNOSIS — K219 Gastro-esophageal reflux disease without esophagitis: Secondary | ICD-10-CM

## 2021-12-17 DIAGNOSIS — H9193 Unspecified hearing loss, bilateral: Secondary | ICD-10-CM

## 2021-12-17 DIAGNOSIS — R7309 Other abnormal glucose: Secondary | ICD-10-CM

## 2021-12-17 DIAGNOSIS — I10 Essential (primary) hypertension: Secondary | ICD-10-CM

## 2021-12-17 DIAGNOSIS — I480 Paroxysmal atrial fibrillation: Secondary | ICD-10-CM

## 2021-12-17 DIAGNOSIS — C679 Malignant neoplasm of bladder, unspecified: Secondary | ICD-10-CM | POA: Diagnosis not present

## 2021-12-17 DIAGNOSIS — I340 Nonrheumatic mitral (valve) insufficiency: Secondary | ICD-10-CM

## 2021-12-17 DIAGNOSIS — E559 Vitamin D deficiency, unspecified: Secondary | ICD-10-CM

## 2021-12-17 DIAGNOSIS — I2510 Atherosclerotic heart disease of native coronary artery without angina pectoris: Secondary | ICD-10-CM

## 2021-12-17 NOTE — Progress Notes (Signed)
Patient ID: Damon [name], male   DOB: February 19, 1946, 76 y.o.   MRN: [id number]  MEDICARE ANNUAL WELLNESS VISIT AND OV  Assessment:   Annual Medicare Wellness Visit Due annually  Health maintenance reviewed  - declines covid 19 booster and shingrix at this time, vaccines have triggered a. Fib in the past  Atherosclerosis of aorta ([place]) - Per CT 2015 Control blood pressure, cholesterol, glucose, increase exercise.   Essential hypertension - continue medications, DASH diet, exercise and monitor at home. Call if greater than 130/80.  - CBC with Differential/Platelet - CMP/GFR - TSH   Paroxysmal atrial fibrillation (HCC) Rate controlled; numerous CVs and several ablations Continue follow up cardio Continue xarelto  CAD Control blood pressure, cholesterol (LDL <70), glucose, increase exercise.  Followed by cardiology No recent chest pain/nitroglycerine use   Peripheral arterial disease ([place]) No claudication sx, cardiology follows Control blood pressure, cholesterol, glucose, increase exercise.    Hyperlipidemia -continue medications - rosuvastatin 20 mg daily, zetia 10 mg daily   - LDL goal <70     -check lipids, decrease fatty foods, increase activity.  - Lipid panel  Other abnormal glucose Recent A1Cs at goal Discussed diet/exercise, weight management  Defer A1C; check CMP  Vitamin D deficiency At goal at recent check; continue to recommend supplementation for goal of 70-100 Defer vitamin D level   Malignant neoplasm of urinary bladder, unspecified site [facility]) Continue close monitoring by urology  OSA on CPAP Sleep apnea- continue CPAP, weight loss advised.    Medication management - Magnesium   Mitral regurgitation Mild last ECHO; Continue cardio follow up  Gastroesophageal reflux disease, esophagitis presence not specified Continue PPI/H2 blocker, diet discussed  Obesity - BMI 30 Long discussion about weight loss, diet, and exercise Recommended diet  heavy in fruits and veggies and low in animal meats, cheeses, and dairy products, appropriate calorie intake Discussed appropriate weight for height, set goal of <195 lb Increase activity level, reduce sweet snacks Follow up at next visit  Insomnia Doing well off of xanax - good sleep hygiene discussed, increase day time activity, try melatonin or benadryl if needed  History of colon polyps Colonoscopy UTD; due 06/2022; increase fiber  Bilateral hearing loss High frequency only, doesn't want hearing aids at this time    Future Appointments  Date Time Provider [place]  01/07/2022  9:30 AM Sherran Needs, NP [facility] None  03/25/2022  9:30 AM [name], MD [facility] None  09/10/2022  3:00 PM [name], MD [facility] None  12/25/2022 11:30 AM Darrol Jump, NP [facility] None     Plan:   During the course of the visit the patient was educated and counseled about appropriate screening and preventive services including:   Pneumococcal vaccine  Influenza vaccine Td vaccine Screening electrocardiogram Bone densitometry screening Colorectal cancer screening Diabetes screening Glaucoma screening Nutrition counseling  Advanced directives: requested  Subjective:   Damon [name]  presents for Medicare Annual Wellness Visit and 3 month follow up for Hypertension, Hyperlipidemia, Pre-Diabetes and Vitamin D Deficiency. He has Essential hypertension; Mitral regurgitation; Hyperlipidemia, mixed; Abnormal glucose; Vitamin D deficiency; CAD- CFX PTCA 11/24/13 ; Bladder cancer ([place]); Gastroesophageal reflux disease; PAD (peripheral artery disease) ([place]); OSA on CPAP; History of colon polyps; Hearing loss of both ears; Overweight (BMI 25.0-29.9); Insomnia; Paroxysmal atrial fibrillation ([place]); Aortic atherosclerosis ([place]) by Abd CT Scan in 2015; Persistent atrial fibrillation ([place]); and Secondary hypercoagulable state ([place]) on their problem list.  Patient was taking  xanax for insomnia but  was able to taper off, reports is sleeping better since getting off of the medication.   In Sept 2014 , Patient had TUR of a Bladder cancer by Dr Tresa Moore, follows annually for bladder scope without recurrence.   He has OSA and is on a CPAP, 100% compliance, reports restorative sleep -   BMI is Body mass index is 30.42 kg/m., he has been working on diet and exercise; typically golfs once a week, does do yardwork several hours, was riding bike regularly, hasn't recently but receptive to restarting. Also admits has been snacking on ice cream too much, plans to start weight loss program, would like to lose 10-20 lb.  Wt Readings from Last 3 Encounters:  12/17/21 206 lb (93.4 kg)  10/07/21 202 lb 3.2 oz (91.7 kg)  09/04/21 203 lb (92.1 kg)   Patient has HTN. Today's BP: 118/68.   Patient has ASCAD  S/p PCA/stenting followed by Rotator Ablation in 2015 (Dr Aundra Dubin).   Patient has had persistent recurrent Afib (on Xarelto).  He has had 8 CV and 3 ablations, most recently had after colonoscopy in 09/2021. Mild MR per ECHO 09/03/2018.   He has PAD, denies recent symptoms, improved with increased exercise. Aortic atherosclerosis per CT 2015. Patient has had no complaints of any cardiac type chest pain, palpitations, dyspnea/orthopnea/PND, dizziness, claudication, or dependent edema.  Hyperlipidemia is controlled with diet & meds, he is on crestor 20 mg and zetia 10 mg for LDL <70. Patient denies myalgias or other med SE's. Last Lipids were at goal -   Lab Results  Component Value Date   CHOL 119 09/04/2021   HDL 56 09/04/2021   LDLCALC 49 09/04/2021   TRIG 67 09/04/2021   CHOLHDL 2.1 09/04/2021   Also, the patient has history of PreDiabetes with A1c 5.7% in 2011 and 5.9% in 2013, and then 5.6% x 2 in late 2013 now controlled by diet.  He's had no symptoms of reactive hypoglycemia, diabetic polys, paresthesias or visual blurring.   Lab Results  Component Value Date   HGBA1C 5.5  09/04/2021    Last GFR:  Lab Results  Component Value Date   EGFR 87 09/04/2021   Further, the patient also has history of Vitamin D Deficiency of 27 in 2008 and supplements vitamin D without any suspected side-effects. Lab Results  Component Value Date   VD25OH 92 09/04/2021      Medication Review: Current Outpatient Medications on File Prior to Visit  Medication Sig   bisoprolol-hydrochlorothiazide (ZIAC) 10-6.25 MG tablet Take  1 tablet  every Morning  for BP   Cholecalciferol (VITAMIN D3) 125 MCG (5000 UT) CAPS Take 10,000 units Daily   diltiazem (CARDIZEM CD) 180 MG 24 hr capsule Take 1 capsule (180 mg total) by mouth daily.   ezetimibe (ZETIA) 10 MG tablet Take 1 tablet (10 mg total) by mouth daily. for cholesterol.   Multiple Vitamin (MULTIVITAMIN WITH MINERALS) TABS tablet Take 1 tablet Daily   nitroGLYCERIN (NITROSTAT) 0.4 MG SL tablet DISSOLVE ONE TABLET UNDER THE TONGUE EVERY 5 MINUTES AS NEEDED FOR CHEST PAIN.DO NOT EXCEED A TOTAL OF 3 DOSES IN 15 MINUTES   Polyvinyl Alcohol-Povidone (REFRESH OP) Place 1 drop into both eyes 3 (three) times daily as needed (dry eyes).   rivaroxaban (XARELTO) 20 MG TABS tablet Take 1 tablet (20 mg total) by mouth daily with supper.   rosuvastatin (CRESTOR) 40 MG tablet Take 1/2 tablet Daily for Cholesterol   No current facility-administered medications on file prior  to visit.    Current Problems (verified) Patient Active Problem List   Diagnosis Date Noted   Persistent atrial fibrillation ([place]) 07/30/2021   Secondary hypercoagulable state ([place]) 07/30/2021   Aortic atherosclerosis ([place]) by Abd CT Scan in 2015 05/16/2020   Paroxysmal atrial fibrillation (HCC)    Insomnia 03/23/2018   Overweight (BMI 25.0-29.9) 08/17/2017   PAD (peripheral artery disease) (HCC)    OSA on CPAP    History of colon polyps    Hearing loss of both ears    Bladder cancer ([place]) 02/16/2015   Gastroesophageal reflux disease 02/16/2015   CAD- CFX PTCA  11/24/13  11/25/2013   Hyperlipidemia, mixed 06/22/2013   Abnormal glucose 06/22/2013   Vitamin D deficiency 06/22/2013   Essential hypertension    Mitral regurgitation     Screening Tests Immunization History  Administered Date(s) Administered   DT (Pediatric) 07/22/2003, 12/28/2013   Influenza Split 04/14/2019   Influenza Whole 05/19/2013   Influenza, High Dose Seasonal PF 04/08/2017, 05/17/2018, 04/14/2019, 05/28/2021   Influenza,inj,Quad PF,6+ Mos 05/09/2014   Influenza-Unspecified 05/14/2015, 05/01/2016, 05/17/2018   PFIZER(Purple Top)SARS-COV-2 Vaccination 08/18/2019, 09/15/2019   Pneumococcal Conjugate-13 07/11/2014   Pneumococcal Polysaccharide-23 12/04/2009, 06/10/2016   Tdap 09/22/2013   Health Maintenance  Topic Date Due   Zoster Vaccines- Shingrix (1 of 2) Never done   COVID-19 Vaccine (3 - Pfizer risk series) 10/13/2019   INFLUENZA VACCINE  02/18/2022   COLONOSCOPY (Pts 45-51yr Insurance coverage will need to be confirmed)  06/23/2022   TETANUS/TDAP  09/23/2023   Pneumonia Vaccine 76 Years old  Completed   Hepatitis C Screening  Completed   HPV VACCINES  Aged Out   Shingrix: declines  Covid 19: 2/2, 2021, pfizer, declining booster at this time, ? 2nd shot triggered a. Fib.   Last colonoscopy: 06/2019, Dr. BInda Coke polyp, 3 year follow up   Names of Other Physician/Practitioners you currently use: 1. [facility] here for primary care 2. [facility], Dr. LTruman Hayward eye doctor, last visit 08/2021 3. ? Name of his dentist, dentist, last visit 2014, several years, no issues, encouraged routine screening and cleaning  Patient Care Team: MUnk Pinto MD as PCP - General (Internal Medicine) MBurnell Blanks MD as PCP - Cardiology (Cardiology) AThompson Grayer MD as PCP - Electrophysiology (Cardiology) KDwan Bolt MD as Referring Physician (Cardiology) AThompson Grayer MD as Consulting Physician (Cardiology) MAlexis Frock MD as Consulting Physician (Urology) BMcarthur Rossetti MD as Consulting Physician (Orthopedic Surgery) PPonciano Ort MD as Referring Physician (Urology) JGarlan Fair MD as Consulting Physician (Gastroenterology)   Allergies No Known Allergies  SURGICAL HISTORY He  has a past surgical history that includes Total hip arthroplasty (07/25/2011); TEE without cardioversion (07/26/2012); Cardioversion (08/06/2012); Total hip arthroplasty (Left, 02/08/2010); Cataract extraction w/ intraocular lens  implant, bilateral; Cardiac electrophysiology study and ablation (07-27-2012  DR ALLRED); Tonsillectomy (age 76; Cystoscopy w/ retrogrades (Bilateral, 04/12/2013); Transurethral resection of bladder tumor with gyrus (turbt-gyrus) (N/A, 04/12/2013); atrial fibrillation ablation (N/A, 07/27/2012); left heart catheterization with coronary angiogram (N/A, 11/24/2013); percutaneous coronary stent intervention (pci-s) (11/24/2013); percutaneous coronary rotoblator intervention (pci-r) (N/A, 11/28/2013); TEE without cardioversion (N/A, 08/25/2014); Cardioversion (N/A, 08/25/2014); Cardioversion (N/A, 11/13/2014); Cardioversion (N/A, 12/28/2014); Cardiac catheterization (N/A, 01/26/2015); TEE without cardioversion (01/26/2015); Ablation of dysrhythmic focus (01/26/2015); Colonoscopy with propofol (N/A, 02/13/2015); Cardioversion (N/A, 01/30/2017); Cardioversion (N/A, 04/06/2017); Cardioversion (N/A, 05/25/2018); Cardioversion (N/A, 08/09/2018); ATRIAL FIBRILLATION ABLATION (N/A, 08/25/2018); TEE without cardioversion (N/A, 07/04/2019); Cardioversion (N/A, 07/04/2019); Cardioversion (N/A, 09/23/2019); Joint replacement; Cystoscopy  with retrograde pyelogram, ureteroscopy and stent placement (03/06/2021); Holmium laser application (Left, 6/46/8032); and Cardioversion (N/A, 08/07/2021). FAMILY HISTORY His family history includes Heart attack in his mother; Heart disease in his father and mother;  Heart failure in his father. SOCIAL HISTORY He  reports that he quit smoking about 40 years ago. His smoking use included cigarettes. He has a 20.00 pack-year smoking history. He has never used smokeless tobacco. He reports that he does not drink alcohol and does not use drugs.  MEDICARE WELLNESS OBJECTIVES: Physical activity:   Cardiac risk factors:   Depression/mood screen:      09/04/2021   10:23 PM  Depression screen PHQ 2/9  Decreased Interest 0  Down, Depressed, Hopeless 0  PHQ - 2 Score 0    ADLs:     09/04/2021   10:23 PM 03/06/2021   10:01 AM  In your present state of health, do you have any difficulty performing the following activities:  Hearing? 0 1  Vision? 0 0  Difficulty concentrating or making decisions? 0 0  Walking or climbing stairs? 0 0  Dressing or bathing? 0 0  Doing errands, shopping? 0      Cognitive Testing  Alert? Yes  Normal Appearance?Yes  Oriented to person? Yes  Place? Yes   Time? Yes  Recall of three objects?  Yes  Can perform simple calculations? Yes  Displays appropriate judgment?Yes  Can read the correct time from a watch face?Yes  EOL planning: Does Patient Have a Medical Advance Directive?: No Would patient like information on creating a medical advance directive?: No - Patient declined    Objective:     Blood pressure 118/68, pulse 60, temperature 97.7 F (36.5 C), weight 206 lb (93.4 kg), SpO2 98 %.   General Appearance:  Alert  WD/WN, male  in no apparent distress. Eyes: PERRLA, EOMs nl, conjunctiva normal Sinuses: No frontal/maxillary tenderness ENT/Mouth: EACs patent / TMs  nl. Nares clear without erythema, swelling, mucoid exudates. Oral hygiene is good. No erythema, swelling, or exudate. Tongue normal, non-obstructing. Tonsils not swollen or erythematous. Not notably HOH today with mask on.  Neck: Supple, thyroid normal. No bruits, nodes or JVD. Respiratory: Respiratory effort normal.  BS equal and clear bilateral without  rales, rhonci, wheezing or stridor. Cardio: Heart sounds are normal with regular rate and rhythm and no murmurs, rubs or gallops. Peripheral pulses are normal and equal bilaterally without edema. No aortic or femoral bruits. Chest: symmetric with normal excursions and percussion.  Abdomen: Flat, soft, with nl bowel sounds. No guarding, rebound, hernias, masses, or organomegaly. Mild suprapubic tenderness Lymphatics: Non tender without lymphadenopathy.  Musculoskeletal: Full ROM all peripheral extremities, joint stability, 5/5 strength, and normal gait. Patient is able to ambulate well. Gait is not  Antalgic. He has left heel pain, over fat pad area, no anterior heel tenderness.  Neuro: Cranial nerves intact, reflexes equal bilaterally. Normal muscle tone, no cerebellar symptoms. Sensation intact.  Pysch: Alert and oriented X 3 with normal affect, insight and judgment appropriate.    Medicare Attestation I have personally reviewed: The patient's medical and social history Their use of alcohol, tobacco or illicit drugs Their current medications and supplements The patient's functional ability including ADLs,fall risks, home safety risks, cognitive, and hearing and visual impairment Diet and physical activities Evidence for depression or mood disorders  The patient's weight, height, BMI, and visual acuity have been recorded in the chart.  I have made referrals, counseling, and provided education to the patient based  on review of the above and I have provided the patient with a written personalized care plan for preventive services.  Over 40 minutes of exam, counseling, chart review was performed.  Izora Ribas, NP   12/17/2021

## 2021-12-18 LAB — LIPID PANEL
Cholesterol: 121 mg/dL (ref <200)
HDL: 57 mg/dL (ref >=40)
LDL Cholesterol (Calc): 50 mg/dL (calc)
Non-HDL Cholesterol (Calc): 64 mg/dL (calc) (ref <130)
Total CHOL/HDL Ratio: 2.1 (calc) (ref <5.0)
Triglycerides: 68 mg/dL (ref <150)

## 2021-12-18 LAB — CBC WITH DIFFERENTIAL/PLATELET
Absolute Monocytes: 624 cells/uL (ref 200–950)
Basophils Absolute: 30 cells/uL (ref 0–200)
Basophils Relative: 0.5 %
Eosinophils Absolute: 240 cells/uL (ref 15–500)
Eosinophils Relative: 4 %
HCT: 46 % (ref 38.5–50.0)
Hemoglobin: 15.8 g/dL (ref 13.2–17.1)
Lymphs Abs: 1584 cells/uL (ref 850–3900)
MCH: 30.9 pg (ref 27.0–33.0)
MCHC: 34.3 g/dL (ref 32.0–36.0)
MCV: 90 fL (ref 80.0–100.0)
MPV: 9.7 fL (ref 7.5–12.5)
Monocytes Relative: 10.4 %
Neutro Abs: 3522 cells/uL (ref 1500–7800)
Neutrophils Relative %: 58.7 %
Platelets: 204 10*3/uL (ref 140–400)
RBC: 5.11 10*6/uL (ref 4.20–5.80)
RDW: 12 % (ref 11.0–15.0)
Total Lymphocyte: 26.4 %
WBC: 6 10*3/uL (ref 3.8–10.8)

## 2021-12-18 LAB — COMPLETE METABOLIC PANEL WITH GFR
AG Ratio: 1.8 (calc) (ref 1.0–2.5)
ALT: 18 U/L (ref 9–46)
AST: 18 U/L (ref 10–35)
Albumin: 4.1 g/dL (ref 3.6–5.1)
Alkaline phosphatase (APISO): 55 U/L (ref 35–144)
BUN: 11 mg/dL (ref 7–25)
CO2: 22 mmol/L (ref 20–32)
Calcium: 9.2 mg/dL (ref 8.6–10.3)
Chloride: 108 mmol/L (ref 98–110)
Creat: 0.75 mg/dL (ref 0.70–1.28)
Globulin: 2.3 g/dL (calc) (ref 1.9–3.7)
Glucose, Bld: 88 mg/dL (ref 65–99)
Potassium: 4.1 mmol/L (ref 3.5–5.3)
Sodium: 141 mmol/L (ref 135–146)
Total Bilirubin: 2 mg/dL — ABNORMAL HIGH (ref 0.2–1.2)
Total Protein: 6.4 g/dL (ref 6.1–8.1)
eGFR: 94 mL/min/{1.73_m2} (ref >=60)

## 2021-12-18 LAB — TSH: TSH: 0.59 mIU/L (ref 0.40–4.50)

## 2021-12-18 LAB — MAGNESIUM: Magnesium: 2 mg/dL (ref 1.5–2.5)

## 2021-12-23 ENCOUNTER — Encounter: Payer: Self-pay | Admitting: Internal Medicine

## 2021-12-23 ENCOUNTER — Ambulatory Visit: Payer: Medicare Other | Admitting: Internal Medicine

## 2021-12-23 VITALS — BP 130/72 | HR 77 | Temp 97.9°F | Resp 16 | Ht 69.0 in | Wt 208.6 lb

## 2021-12-23 DIAGNOSIS — H60391 Other infective otitis externa, right ear: Secondary | ICD-10-CM

## 2021-12-23 DIAGNOSIS — S00411A Abrasion of right ear, initial encounter: Secondary | ICD-10-CM

## 2021-12-23 MED ORDER — NEOMYCIN-POLYMYXIN-HC 3.5-10000-1 OT SUSP: OTIC | 1 refills | Status: DC

## 2021-12-23 NOTE — Progress Notes (Signed)
Future Appointments  Date Time Provider Department  01/07/2022  9:30 AM Sherran Needs, NP [facility]  03/25/2022  9:30 AM [name], MD [facility]  09/10/2022  3:00 PM [name], MD [facility]  12/25/2022 11:30 AM Darrol Jump, NP [facility]    History of Present Illness:      Patient is a very nice 76 yo MWM for pAfib, now post Ablation & on Xarelto. Patient presents today admitting that he used Q-tips in his ear canals yesterday w/o apparent discomfort. During the night he awoke with blood & clots on his pillow.     Medications    bisoprolol-hydrochlorothiazide (ZIAC) 10-6.25 MG tablet, Take  1 tablet  every Morning  for BP   diltiazem (CARDIZEM CD) 180 MG 24 hr capsule, Take 1 capsule (180 mg total) by mouth daily.   ezetimibe (ZETIA) 10 MG tablet, Take 1 tablet (10 mg total) by mouth daily. for cholesterol.   nitroGLYCERIN (NITROSTAT) 0.4 MG SL tablet, DISSOLVE ONE TABLET UNDER THE TONGUE EVERY 5 MINUTES AS NEEDED FOR CHEST PAIN.DO NOT EXCEED A TOTAL OF 3 DOSES IN 15 MINUTES   rosuvastatin (CRESTOR) 40 MG tablet, Take 1/2 tablet Daily for Cholesterol   rivaroxaban (XARELTO) 20 MG TABS tablet, Take 1 tablet (20 mg total) by mouth daily with supper.   Cholecalciferol (VITAMIN D3) 125 MCG (5000 UT) CAPS, Take 10,000 units Daily   Multiple Vitamin (MULTIVITAMIN WITH MINERALS) TABS tablet, Take 1 tablet Daily    Polyvinyl Alcohol-Povidone (REFRESH OP), Place 1 drop into both eyes 3 (three) times daily as needed (dry eyes).  Problem list He has Essential hypertension; Mitral regurgitation; Hyperlipidemia, mixed; Abnormal glucose; Vitamin D deficiency; CAD- CFX PTCA 11/24/13 ; Bladder cancer ([place]); Gastroesophageal reflux disease; PAD (peripheral artery disease) ([place]); OSA on CPAP; History of colon polyps; Hearing loss of both ears; Overweight (BMI 25.0-29.9); Insomnia; Paroxysmal atrial fibrillation ([place]); Aortic atherosclerosis ([place]) by Abd CT Scan in 2015; Persistent  atrial fibrillation ([place]); and Secondary hypercoagulable state ([place]) on their problem list.   Observations/Objective:  BP 130/72   Pulse 77   Temp 97.9 F (36.6 C)   Resp 16   Ht '5\' 9"'$  (1.753 m)   Wt 208 lb 9.6 oz (94.6 kg)   SpO2 95%   BMI 30.80 kg/m   HEENT - WNL except blood clot obscuring Rt TM. After irrigating  with warm water, a blood clot was flushed from the Rt EAC. Re- exam found an excoriated  area of the  mid posterior EAC. TM was now visualized & appeared Normal.   Neck - supple.  Chest - Clear equal BS. Cor - Nl HS. RRR w/o sig MGR. PP 1(+). No edema. MS- FROM w/o deformities.  Gait Nl. Neuro -  Nl w/o focal abnormalities.   Assessment and Plan:  1. Abrasion of right ear canal, initial encounter   2. Infective otitis externa of right ear     neomycin-polymyxin-hydrocortisone (CORTISPORIN) 3.5-10000-1 OTIC suspension, Instill 4 - 6 drops to affected ear  4 x /day   Follow Up Instructions:        I discussed the assessment and treatment plan with the patient.  Patient was advised not to insert anything into his ear canals.  The patient was provided an opportunity to ask questions and all were answered. The patient agreed with the plan and demonstrated an understanding of the instructions.       The patient was advised to call back or seek an in-person evaluation  if the symptoms worsen or if the condition fails to improve as anticipated.   Kirtland Bouchard, MD

## 2022-01-07 ENCOUNTER — Encounter (HOSPITAL_COMMUNITY): Payer: Self-pay | Admitting: Nurse Practitioner

## 2022-01-07 ENCOUNTER — Ambulatory Visit (HOSPITAL_COMMUNITY)
Admission: RE | Admit: 2022-01-07 | Discharge: 2022-01-07 | Disposition: A | Discharge status: Home / Self Care | Payer: Medicare Other | Source: Ambulatory Visit | Attending: Nurse Practitioner | Admitting: Nurse Practitioner

## 2022-01-07 VITALS — BP 124/72 | HR 56 | Ht 69.0 in | Wt 206.8 lb

## 2022-01-07 DIAGNOSIS — Z79899 Other long term (current) drug therapy: Secondary | ICD-10-CM | POA: Insufficient documentation

## 2022-01-07 DIAGNOSIS — D6869 Other thrombophilia: Secondary | ICD-10-CM | POA: Diagnosis not present

## 2022-01-07 DIAGNOSIS — I2510 Atherosclerotic heart disease of native coronary artery without angina pectoris: Secondary | ICD-10-CM | POA: Diagnosis not present

## 2022-01-07 DIAGNOSIS — I4819 Other persistent atrial fibrillation: Secondary | ICD-10-CM | POA: Diagnosis present

## 2022-01-07 DIAGNOSIS — G4733 Obstructive sleep apnea (adult) (pediatric): Secondary | ICD-10-CM | POA: Diagnosis not present

## 2022-01-07 DIAGNOSIS — I10 Essential (primary) hypertension: Secondary | ICD-10-CM | POA: Insufficient documentation

## 2022-01-07 DIAGNOSIS — Z7901 Long term (current) use of anticoagulants: Secondary | ICD-10-CM | POA: Diagnosis not present

## 2022-01-07 NOTE — Progress Notes (Signed)
Primary Care Physician: [name], MD Referring Physician: [name] is a 76 y.o. male with a h/o paroxysmal afib that is in the clinic for going back into afib sometime over the last couple of days.He had an colonoscopy last Friday which may have been  the trigger. He came off anticoagulation for 2 days prior. He is rate controled today.  F/u in afib clinic, 07/11/19, after successful  cardioversion and continues in SR. He c/o of some burning/nausea after the cardioversion but states it was actually going on before then as he had stopped Vit C thinking he was not tolerating. He has seen his PCP and was started on PPI and symptoms  are  improving.   F/u in afib clinic, 09/19/19, as pt has gone back into  afib and is requesting cardioversion.He had successful cardioversion in December 2020. He feels the trigger was the covid shot. The afib started the next day after the shot. He is rate controlled. He has had 3 ablations in the past. He was out of town when he went into afib. Low dose BB was added but he remains in afib   F/u in afib clinic, 10/03/19. He had successful cardioversion and remains in SR. No further episodes of afib. EKG shows SR at 57 bpm. He still feels second covid shot may have done triggered afib.   Follow up in the AF clinic 07/30/21. He reports that he felt he was in afib on 07/25/21 with palpitations and fatigue. He was visiting family in [place] and presented to the ED there. Since he was stable and rate controlled, he was not cardioverted. He remains in rate controlled afib today. No missed doses of anticoagulation.   F/u in afib clinic, 08/21/21. He had a successful cardioversion and remains  in afib. He says he went 2 yrs since his last cardioversion. He would like to switch over to Eliquis for the difference in cost and will send  him in 30 day free and then 90 day mail order.   Follow up in the AF clinic 10/07/21. Patient reports that he woke in the  middle of the night 10/05/21 with tachypalpitations. There were no specific triggers that he could identify. His symptoms resolved at about 10 AM this morning. No bleeding issues on anticoagulation.   F/u in afib clinic, 01/07/2022. He reports that he is doing well, no afib or tachyrhythmia's. No issues with anticoagulation.   Today, he denies symptoms of chest pain, shortness of breath, orthopnea, PND, lower extremity edema, dizziness, presyncope, syncope, or neurologic sequela. The patient is tolerating medications without difficulties and is otherwise without complaint today.   Past Medical History:  Diagnosis Date   Bladder cancer ([place])    Hearing loss of both ears    History of colon polyps    BENIGN   Hyperlipidemia    Hypertension    Mitral regurgitation    OSA on CPAP    PAD (peripheral artery disease) ([facility])    ABI--  RIGHT SIDE NORMAL LEFT SIDE MODERATELY REDUCED W/ DISTAL LEFT SFA STENOSIS//  MILD CALDICATION   PAF (paroxysmal atrial fibrillation) ([place])     a. s/p PVI 2014; b. s/p redo PVI and CTI 01/2015   Vitamin D deficiency    Past Surgical History:  Procedure Laterality Date   ABLATION OF DYSRHYTHMIC FOCUS  01/26/2015   ATRIAL FIBRILLATION ABLATION N/A 07/27/2012   Procedure: ATRIAL FIBRILLATION ABLATION;  Surgeon: [name], MD;  Location:  [place] CATH LAB;  Service: Cardiovascular;  Laterality: N/A;   ATRIAL FIBRILLATION ABLATION N/A 08/25/2018   Procedure: ATRIAL FIBRILLATION ABLATION;  Surgeon: [name], MD;  Location: [place] CV LAB;  Service: Cardiovascular;  Laterality: N/A;   CARDIAC ELECTROPHYSIOLOGY STUDY AND ABLATION  07-27-2012  DR [name]   SUCCESSFUL ABLATION OF A-FIB   CARDIOVERSION  08/06/2012   Procedure: CARDIOVERSION;  Surgeon: [name], MD;  Location: [place];  Service: Cardiovascular;  Laterality: N/A;   CARDIOVERSION N/A 08/25/2014   Procedure: CARDIOVERSION;  Surgeon: Larey Dresser, MD;  Location: [place];  Service:  Cardiovascular;  Laterality: N/A;   CARDIOVERSION N/A 11/13/2014   Procedure: CARDIOVERSION;  Surgeon: Sueanne Margarita, MD;  Location: [place];  Service: Cardiovascular;  Laterality: N/A;   CARDIOVERSION N/A 12/28/2014   Procedure: CARDIOVERSION;  Surgeon: [name], MD;  Location: [place];  Service: Cardiovascular;  Laterality: N/A;   CARDIOVERSION N/A 01/30/2017   Procedure: CARDIOVERSION;  Surgeon: Josue Hector, MD;  Location: [facility] ENDOSCOPY;  Service: Cardiovascular;  Laterality: N/A;   CARDIOVERSION N/A 04/06/2017   Procedure: CARDIOVERSION;  Surgeon: Dorothy Spark, MD;  Location: [facility] ENDOSCOPY;  Service: Cardiovascular;  Laterality: N/A;   CARDIOVERSION N/A 05/25/2018   Procedure: CARDIOVERSION;  Surgeon: Skeet Latch, MD;  Location: [place];  Service: Cardiovascular;  Laterality: N/A;   CARDIOVERSION N/A 08/09/2018   Procedure: CARDIOVERSION;  Surgeon: Buford Dresser, MD;  Location: [facility] ENDOSCOPY;  Service: Cardiovascular;  Laterality: N/A;   CARDIOVERSION N/A 07/04/2019   Procedure: CARDIOVERSION;  Surgeon: Dorothy Spark, MD;  Location: [facility] ENDOSCOPY;  Service: Cardiovascular;  Laterality: N/A;   CARDIOVERSION N/A 09/23/2019   Procedure: CARDIOVERSION;  Surgeon: Dorothy Spark, MD;  Location: [facility] ENDOSCOPY;  Service: Cardiovascular;  Laterality: N/A;   CARDIOVERSION N/A 08/07/2021   Procedure: CARDIOVERSION;  Surgeon: Sueanne Margarita, MD;  Location: [facility] ENDOSCOPY;  Service: Cardiovascular;  Laterality: N/A;   CATARACT EXTRACTION W/ INTRAOCULAR LENS  IMPLANT, BILATERAL     COLONOSCOPY WITH PROPOFOL N/A 02/13/2015   Procedure: COLONOSCOPY WITH PROPOFOL;  Surgeon: Garlan Fair, MD;  Location: [facility] ENDOSCOPY;  Service: Endoscopy;  Laterality: N/A;   CYSTOSCOPY W/ RETROGRADES Bilateral 04/12/2013   Procedure: CYSTOSCOPY WITH RETROGRADE PYELOGRAM;  Surgeon: [name], MD;  Location: [facility];  Service: Urology;  Laterality:  Bilateral;   CYSTOSCOPY WITH RETROGRADE PYELOGRAM, URETEROSCOPY AND STENT PLACEMENT  03/06/2021   Procedure: CYSTOSCOPY WITH BILATERAL RETROGRADE PYELOGRAM, LEFT URETEROSCOPY AND STENT PLACEMENT;  Surgeon: [name], MD;  Location: [facility];  Service: Urology;;  1 HR   ELECTROPHYSIOLOGIC STUDY N/A 01/26/2015   Procedure: Atrial Fibrillation Ablation;  Surgeon: [name], MD;  Location: [place] CV LAB;  Service: Cardiovascular;  Laterality: N/A;   HOLMIUM LASER APPLICATION Left 1/60/1093   Procedure: HOLMIUM LASER APPLICATION;  Surgeon: [name], MD;  Location: [facility];  Service: Urology;  Laterality: Left;   JOINT REPLACEMENT     LEFT HEART CATHETERIZATION WITH CORONARY ANGIOGRAM N/A 11/24/2013   Procedure: LEFT HEART CATHETERIZATION WITH CORONARY ANGIOGRAM;  Surgeon: [name], MD;  Location: [facility] CATH LAB;  Service: Cardiovascular;  Laterality: N/A;   PERCUTANEOUS CORONARY ROTOBLATOR INTERVENTION (PCI-R) N/A 11/28/2013   Procedure: PERCUTANEOUS CORONARY ROTOBLATOR INTERVENTION (PCI-R);  Surgeon: [name], MD;  Location: [facility] CATH LAB;  Service: Cardiovascular;  Laterality: N/A;   PERCUTANEOUS CORONARY STENT INTERVENTION (PCI-S)  11/24/2013   Procedure: PERCUTANEOUS CORONARY STENT INTERVENTION (PCI-S);  Surgeon:  [name], MD;  Location: [facility] CATH LAB;  Service: Cardiovascular;;   TEE WITHOUT CARDIOVERSION  07/26/2012   Procedure: TRANSESOPHAGEAL ECHOCARDIOGRAM (TEE);  Surgeon: [name], MD;  Location: [facility] ENDOSCOPY;  Service: Cardiovascular;  Laterality: N/A;   TEE WITHOUT CARDIOVERSION N/A 08/25/2014   Procedure: TRANSESOPHAGEAL ECHOCARDIOGRAM (TEE);  Surgeon: Larey Dresser, MD;  Location: [place];  Service: Cardiovascular;  Laterality: N/A;   TEE WITHOUT CARDIOVERSION  01/26/2015   Procedure: Transesophageal Echocardiogram (Tee);  Surgeon: [name], MD;  Location: [place] CV LAB;   Service: Cardiovascular;;   TEE WITHOUT CARDIOVERSION N/A 07/04/2019   Procedure: TRANSESOPHAGEAL ECHOCARDIOGRAM (TEE);  Surgeon: Dorothy Spark, MD;  Location: [facility] ENDOSCOPY;  Service: Cardiovascular;  Laterality: N/A;   TONSILLECTOMY  age 34   TOTAL HIP ARTHROPLASTY  07/25/2011   Procedure: TOTAL HIP ARTHROPLASTY ANTERIOR APPROACH;  Surgeon: Mcarthur Rossetti;  Location: [facility] ORS;  Service: Orthopedics;  Laterality: Right;   TOTAL HIP ARTHROPLASTY Left 02/08/2010   TRANSURETHRAL RESECTION OF BLADDER TUMOR WITH GYRUS (TURBT-GYRUS) N/A 04/12/2013   Procedure: TRANSURETHRAL RESECTION OF BLADDER TUMOR WITH GYRUS (TURBT-GYRUS);  Surgeon: [name], MD;  Location: [facility];  Service: Urology;  Laterality: N/A;    Current Outpatient Medications  Medication Sig Dispense Refill   bisoprolol-hydrochlorothiazide (ZIAC) 10-6.25 MG tablet Take  1 tablet  every Morning  for BP 90 tablet 3   Cholecalciferol (VITAMIN D3) 125 MCG (5000 UT) CAPS Take 10,000 units Daily 30 capsule    diltiazem (CARDIZEM CD) 180 MG 24 hr capsule Take 1 capsule (180 mg total) by mouth daily. 30 capsule 3   ezetimibe (ZETIA) 10 MG tablet Take 1 tablet (10 mg total) by mouth daily. for cholesterol. 90 tablet 2   Multiple Vitamin (MULTIVITAMIN WITH MINERALS) TABS tablet Take 1 tablet Daily     neomycin-polymyxin-hydrocortisone (CORTISPORIN) 3.5-10000-1 OTIC suspension Instill 4 - 6 drops to affected ear  4 x /day 10 mL 1   nitroGLYCERIN (NITROSTAT) 0.4 MG SL tablet DISSOLVE ONE TABLET UNDER THE TONGUE EVERY 5 MINUTES AS NEEDED FOR CHEST PAIN.DO NOT EXCEED A TOTAL OF 3 DOSES IN 15 MINUTES 25 tablet 6   Polyvinyl Alcohol-Povidone (REFRESH OP) Place 1 drop into both eyes 3 (three) times daily as needed (dry eyes).     rivaroxaban (XARELTO) 20 MG TABS tablet Take 1 tablet (20 mg total) by mouth daily with supper. 90 tablet 2   rosuvastatin (CRESTOR) 40 MG tablet Take 1/2 tablet Daily for Cholesterol 45  tablet 3   No current facility-administered medications for this encounter.    No Known Allergies  Social History   Socioeconomic History   Marital status: Married    Spouse name: Not on file   Number of children: Not on file   Years of education: Not on file   Highest education level: Not on file  Occupational History   Not on file  Tobacco Use   Smoking status: Former    Packs/day: 2.00    Years: 10.00    Total pack years: 20.00    Types: Cigarettes    Quit date: 09/20/1981    Years since quitting: 40.3   Smokeless tobacco: Never   Tobacco comments:    Former smoker 01/07/22  Vaping Use   Vaping Use: Never used  Substance and Sexual Activity   Alcohol use: No    Comment: pt states he has stopped drinking alcohol   Drug use: No   Sexual activity: Not  on file  Other Topics Concern   Not on file  Social History Narrative   Not on file   Social Determinants of Health   Financial Resource Strain: Not on file  Food Insecurity: Not on file  Transportation Needs: Not on file  Physical Activity: Not on file  Stress: Not on file  Social Connections: Not on file  Intimate Partner Violence: Not on file    Family History  Problem Relation Age of Onset   Heart attack Mother    Heart disease Mother    Heart failure Father    Heart disease Father     ROS- All systems are reviewed and negative except as per the HPI above  Physical Exam: Vitals:   01/07/22 0920  BP: 124/72  Pulse: (!) 56  Weight: 93.8 kg  Height: '5\' 9"'$  (1.753 m)     Wt Readings from Last 3 Encounters:  01/07/22 93.8 kg  12/23/21 94.6 kg  12/17/21 93.4 kg    Labs: Lab Results  Component Value Date   NA 141 12/17/2021   K 4.1 12/17/2021   CL 108 12/17/2021   CO2 22 12/17/2021   GLUCOSE 88 12/17/2021   BUN 11 12/17/2021   CREATININE 0.75 12/17/2021   CALCIUM 9.2 12/17/2021   MG 2.0 12/17/2021   Lab Results  Component Value Date   INR 1.1 (H) 11/16/2013   Lab Results  Component  Value Date   CHOL 121 12/17/2021   HDL 57 12/17/2021   LDLCALC 50 12/17/2021   TRIG 68 12/17/2021    GEN- The patient is a well appearing elderly male, alert and oriented x 3 today.   HEENT-head normocephalic, atraumatic, sclera clear, conjunctiva pink, hearing intact, trachea midline. Lungs- Clear to ausculation bilaterally, normal work of breathing Heart- Regular rate and rhythm, no murmurs, rubs or gallops  GI- soft, NT, ND, + BS Extremities- no clubbing, cyanosis, or edema MS- no significant deformity or atrophy Skin- no rash or lesion Psych- euthymic mood, full affect Neuro- strength and sensation are intact   EKG-  Vent. rate 56 BPM PR interval 164 ms QRS duration 94 ms QT/QTcB 450/434 ms P-R-T axes 2 7 32 Sinus bradycardia Septal infarct , age undetermined Abnormal ECG When compared with ECG of 07-Oct-2021 14:03, PREVIOUS ECG IS PRESENT  Assessment and Plan: 1. Persistent afib  S/p ablation 2014 and 2020 Patient continues in SR No recent afib or tachyarrhythmia's Continue diltiazem 180 mg daily  Continue Xarelto 20 mg daily   2. CHA2DS2VASc score of 4 Continue Xarelto 20 mg daily.   3. HTN Stable, no changes today.  4. OSA Followed by Dr Maxwell Caul  5. CAD No anginal symptoms.   Follow up in the AF clinic as needed    Butch Penny C. Hilman Kissling, [facility] [address] [place], [place] [address] [phone]

## 2022-02-11 ENCOUNTER — Other Ambulatory Visit: Payer: Self-pay | Admitting: Internal Medicine

## 2022-02-11 DIAGNOSIS — E782 Mixed hyperlipidemia: Secondary | ICD-10-CM

## 2022-02-11 DIAGNOSIS — I2510 Atherosclerotic heart disease of native coronary artery without angina pectoris: Secondary | ICD-10-CM

## 2022-03-11 ENCOUNTER — Ambulatory Visit (HOSPITAL_COMMUNITY)
Admission: RE | Admit: 2022-03-11 | Discharge: 2022-03-11 | Disposition: A | Discharge status: Home / Self Care | Payer: Medicare Other | Source: Ambulatory Visit | Attending: Nurse Practitioner | Admitting: Nurse Practitioner

## 2022-03-11 ENCOUNTER — Encounter (HOSPITAL_COMMUNITY): Payer: Self-pay | Admitting: Nurse Practitioner

## 2022-03-11 VITALS — BP 116/68 | HR 69 | Ht 69.0 in | Wt 207.6 lb

## 2022-03-11 DIAGNOSIS — I10 Essential (primary) hypertension: Secondary | ICD-10-CM | POA: Insufficient documentation

## 2022-03-11 DIAGNOSIS — G4733 Obstructive sleep apnea (adult) (pediatric): Secondary | ICD-10-CM | POA: Insufficient documentation

## 2022-03-11 DIAGNOSIS — I480 Paroxysmal atrial fibrillation: Secondary | ICD-10-CM

## 2022-03-11 DIAGNOSIS — I2510 Atherosclerotic heart disease of native coronary artery without angina pectoris: Secondary | ICD-10-CM | POA: Insufficient documentation

## 2022-03-11 DIAGNOSIS — D6869 Other thrombophilia: Secondary | ICD-10-CM

## 2022-03-11 DIAGNOSIS — I4819 Other persistent atrial fibrillation: Secondary | ICD-10-CM | POA: Diagnosis present

## 2022-03-11 LAB — CBC
HCT: 45 % (ref 39.0–52.0)
Hemoglobin: 15.3 g/dL (ref 13.0–17.0)
MCH: 30.4 pg (ref 26.0–34.0)
MCHC: 34 g/dL (ref 30.0–36.0)
MCV: 89.3 fL (ref 80.0–100.0)
Platelets: 193 10*3/uL (ref 150–400)
RBC: 5.04 MIL/uL (ref 4.22–5.81)
RDW: 12.2 % (ref 11.5–15.5)
WBC: 6.4 10*3/uL (ref 4.0–10.5)
nRBC: 0 % (ref 0.0–0.2)

## 2022-03-11 LAB — BASIC METABOLIC PANEL
Anion gap: 5 (ref 5–15)
BUN: 10 mg/dL (ref 8–23)
CO2: 26 mmol/L (ref 22–32)
Calcium: 9.1 mg/dL (ref 8.9–10.3)
Chloride: 109 mmol/L (ref 98–111)
Creatinine, Ser: 1 mg/dL (ref 0.61–1.24)
GFR, Estimated: >60 mL/min (ref >60)
Glucose, Bld: 117 mg/dL — ABNORMAL HIGH (ref 70–99)
Potassium: 4.1 mmol/L (ref 3.5–5.1)
Sodium: 140 mmol/L (ref 135–145)
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
## 2022-03-11 NOTE — Patient Instructions (Signed)
Cardioversion scheduled for Tuesday, August 28th  - Arrive at the [facility] and go to admitting at 1030am  - Do not eat or drink anything after midnight the night prior to your procedure.  - Take all your morning medication (except diabetic medications) with a sip of water prior to arrival.  - You will not be able to drive home after your procedure.  - Do NOT miss any doses of your blood thinner - if you should miss a dose please notify our office immediately.  - If you feel as if you go back into normal rhythm prior to scheduled cardioversion, please notify our office immediately. If your procedure is canceled in the cardioversion suite you will be charged a cancellation fee.
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
## 2022-03-14 NOTE — Progress Notes (Signed)
Attempted to obtain medical history via telephone, unable to reach at this time. HIPAA compliant voicemail message left requesting return call to pre surgical testing department. 

## 2022-03-17 ENCOUNTER — Other Ambulatory Visit: Payer: Self-pay

## 2022-03-17 ENCOUNTER — Ambulatory Visit (HOSPITAL_COMMUNITY)
Admission: RE | Admit: 2022-03-17 | Discharge: 2022-03-17 | Disposition: A | Discharge status: Home / Self Care | Payer: Medicare Other | Attending: Cardiology | Admitting: Cardiology

## 2022-03-17 ENCOUNTER — Encounter (HOSPITAL_COMMUNITY)
Admission: RE | Disposition: A | Discharge status: Home / Self Care | Payer: Self-pay | Source: Home / Self Care | Attending: Cardiology

## 2022-03-17 ENCOUNTER — Ambulatory Visit (HOSPITAL_COMMUNITY): Payer: Medicare Other | Admitting: Certified Registered Nurse Anesthetist

## 2022-03-17 ENCOUNTER — Ambulatory Visit (HOSPITAL_BASED_OUTPATIENT_CLINIC_OR_DEPARTMENT_OTHER): Payer: Medicare Other | Admitting: Certified Registered Nurse Anesthetist

## 2022-03-17 DIAGNOSIS — Z87891 Personal history of nicotine dependence: Secondary | ICD-10-CM | POA: Diagnosis not present

## 2022-03-17 DIAGNOSIS — Z7901 Long term (current) use of anticoagulants: Secondary | ICD-10-CM | POA: Diagnosis not present

## 2022-03-17 DIAGNOSIS — I4891 Unspecified atrial fibrillation: Secondary | ICD-10-CM

## 2022-03-17 DIAGNOSIS — I2510 Atherosclerotic heart disease of native coronary artery without angina pectoris: Secondary | ICD-10-CM | POA: Insufficient documentation

## 2022-03-17 DIAGNOSIS — I4819 Other persistent atrial fibrillation: Secondary | ICD-10-CM

## 2022-03-17 DIAGNOSIS — Z79899 Other long term (current) drug therapy: Secondary | ICD-10-CM | POA: Insufficient documentation

## 2022-03-17 DIAGNOSIS — G4733 Obstructive sleep apnea (adult) (pediatric): Secondary | ICD-10-CM | POA: Insufficient documentation

## 2022-03-17 DIAGNOSIS — I10 Essential (primary) hypertension: Secondary | ICD-10-CM

## 2022-03-17 HISTORY — PX: CARDIOVERSION: SHX1299

## 2022-03-17 SURGERY — CARDIOVERSION
Anesthesia: General

## 2022-03-17 MED ORDER — SODIUM CHLORIDE 0.9 % IV SOLN: INTRAVENOUS | Status: DC

## 2022-03-17 MED ORDER — SODIUM CHLORIDE 0.9 % IV SOLN
INTRAVENOUS | Status: AC | PRN
  Administered 2022-03-17: 500 mL via INTRAVENOUS

## 2022-03-17 MED ORDER — LIDOCAINE 2% (20 MG/ML) 5 ML SYRINGE
INTRAMUSCULAR | Status: DC | PRN
  Administered 2022-03-17: 100 mg via INTRAVENOUS

## 2022-03-17 MED ORDER — PROPOFOL 10 MG/ML IV BOLUS
INTRAVENOUS | Status: DC | PRN
  Administered 2022-03-17: 20 mg via INTRAVENOUS
  Administered 2022-03-17: 60 mg via INTRAVENOUS

## 2022-03-17 NOTE — CV Procedure (Signed)
    Electrical Cardioversion Procedure Note Damon [name] [id number] 07/23/1945  Procedure: Electrical Cardioversion Indications:  Atrial Fibrillation  Time Out: Verified patient identification, verified procedure,medications/allergies/relevent history reviewed, required imaging and test results available.  Performed  Procedure Details  The patient was NPO after midnight. Anesthesia was administered at the beside  by Dr.Germeroth with '80mg'$  of propofol and '90mg'$  Lidocaine.  Cardioversion was done with synchronized biphasic defibrillation with AP pads with 150watts.  The patient converted to normal sinus rhythm. The patient tolerated the procedure well   IMPRESSION:  Successful cardioversion of atrial fibrillation    Damon [name] 03/17/2022, 10:27 AM

## 2022-03-17 NOTE — Transfer of Care (Signed)
Immediate Anesthesia Transfer of Care Note  Patient: Damon [name]  Procedure(s) Performed: CARDIOVERSION  Patient Location: Endoscopy Unit  Anesthesia Type:General  Level of Consciousness: drowsy and patient cooperative  Airway & Oxygen Therapy: Patient Spontanous Breathing  Post-op Assessment: Report given to RN and Post -op Vital signs reviewed and stable  Post vital signs: Reviewed and stable  Last Vitals:  Vitals Value Taken Time  BP 107/66   Temp    Pulse 65   Resp 16   SpO2 96%     Last Pain:  Vitals:   03/17/22 1030  TempSrc: Temporal  PainSc: 0-No pain         Complications: No notable events documented.

## 2022-03-17 NOTE — Anesthesia Preprocedure Evaluation (Signed)
Anesthesia Evaluation  Patient identified by MRN, date of birth, ID band Patient awake    Reviewed: Allergy & Precautions, NPO status , Patient's Chart, lab work & pertinent test results  Airway Mallampati: II  TM Distance: >3 FB Neck ROM: Full    Dental  (+) Teeth Intact, Dental Advisory Given   Pulmonary sleep apnea and Continuous Positive Airway Pressure Ventilation , former smoker,    Pulmonary exam normal breath sounds clear to auscultation       Cardiovascular hypertension, Pt. on home beta blockers and Pt. on medications (-) angina+ CAD, + Cardiac Stents and + Peripheral Vascular Disease   Rhythm:Irregular Rate:Abnormal  Echo 06/2019 1. Left ventricular ejection fraction, by visual estimation, is 55 to 60%. The left ventricle has normal function. Normal left ventricular size. Left ventricular septal wall thickness was normal. There is no left ventricular hypertrophy.  2. Left ventricular diastolic function could not be evaluated.  3. The left ventricle has no regional wall motion abnormalities.  4. Global right ventricle has normal systolic function.The right ventricular size is normal. No increase in right ventricular wall thickness.  5. Left atrial size was normal.  6. No thrombus in the left atrium or left atrial appendage.  7. Right atrial size was normal.  8. The mitral valve is normal in structure. Mild mitral valve  regurgitation. No evidence of mitral stenosis.  9. The tricuspid valve is normal in structure. Tricuspid valve regurgitation is mild.  10. The aortic valve is normal in structure. Aortic valve regurgitation is not visualized. No evidence of aortic valve sclerosis or stenosis.  11. The pulmonic valve was normal in structure. Pulmonic valve regurgitation is not visualized.  12. The inferior vena cava is normal in size with greater than 50% respiratory variability, suggesting right atrial pressure of 3  mmHg.  13. No intracardiac source of embolism. This study was followed by a successful cardioversion.    Neuro/Psych negative neurological ROS     GI/Hepatic Neg liver ROS, GERD  ,  Endo/Other  negative endocrine ROS  Renal/GU negative Renal ROS     Musculoskeletal negative musculoskeletal ROS (+)   Abdominal (+) + obese,   Peds  Hematology  (+) Blood dyscrasia (Xarelto), ,   Anesthesia Other Findings Day of surgery medications reviewed with the patient.  Reproductive/Obstetrics                            Anesthesia Physical  Anesthesia Plan  ASA: 3  Anesthesia Plan: General   Post-op Pain Management: Minimal or no pain anticipated   Induction: Intravenous  PONV Risk Score and Plan: 2 and TIVA, Treatment may vary due to age or medical condition and Propofol infusion  Airway Management Planned: Natural Airway and Simple Face Mask  Additional Equipment:   Intra-op Plan:   Post-operative Plan:   Informed Consent: I have reviewed the patients History and Physical, chart, labs and discussed the procedure including the risks, benefits and alternatives for the proposed anesthesia with the patient or authorized representative who has indicated his/her understanding and acceptance.     Dental advisory given  Plan Discussed with: CRNA  Anesthesia Plan Comments:        Anesthesia Quick Evaluation

## 2022-03-17 NOTE — Interval H&P Note (Signed)
History and Physical Interval Note:  03/17/2022 10:26 AM  Damon [name]  has presented today for surgery, with the diagnosis of AFIB.  The various methods of treatment have been discussed with the patient and family. After consideration of risks, benefits and other options for treatment, the patient has consented to  Procedure(s): CARDIOVERSION (N/A) as a surgical intervention.  The patient's history has been reviewed, patient examined, no change in status, stable for surgery.  I have reviewed the patient's chart and labs.  Questions were answered to the patient's satisfaction.     Fransico Him

## 2022-03-17 NOTE — Anesthesia Procedure Notes (Signed)
Procedure Name: General with mask airway Date/Time: 03/17/2022 10:56 AM  Performed by: Janene Harvey, CRNAPre-anesthesia Checklist: Patient identified, Emergency Drugs available, Suction available and Patient being monitored Patient Re-evaluated:Patient Re-evaluated prior to induction Oxygen Delivery Method: Ambu bag Placement Confirmation: positive ETCO2 Dental Injury: Teeth and Oropharynx as per pre-operative assessment

## 2022-03-17 NOTE — Discharge Instructions (Signed)

## 2022-03-17 NOTE — Anesthesia Postprocedure Evaluation (Signed)
Anesthesia Post Note  Patient: Damon [name]  Procedure(s) Performed: CARDIOVERSION     Patient location during evaluation: Endoscopy Anesthesia Type: General Level of consciousness: sedated and patient cooperative Pain management: pain level controlled Vital Signs Assessment: post-procedure vital signs reviewed and stable Respiratory status: spontaneous breathing Cardiovascular status: stable Anesthetic complications: no   No notable events documented.  Last Vitals:  Vitals:   03/17/22 1110 03/17/22 1120  BP: 99/68 100/71  Pulse: (!) 59 60  Resp: 16 12  Temp:    SpO2: 94% 93%    Last Pain:  Vitals:   03/17/22 1120  TempSrc:   PainSc: 0-No pain                 Nolon Nations

## 2022-03-19 ENCOUNTER — Encounter (HOSPITAL_COMMUNITY): Payer: Self-pay | Admitting: Cardiology

## 2022-03-25 ENCOUNTER — Ambulatory Visit: Payer: Medicare Other | Admitting: Internal Medicine

## 2022-03-26 ENCOUNTER — Ambulatory Visit (HOSPITAL_COMMUNITY)
Admission: RE | Admit: 2022-03-26 | Discharge: 2022-03-26 | Disposition: A | Discharge status: Home / Self Care | Payer: Medicare Other | Source: Ambulatory Visit | Attending: Nurse Practitioner | Admitting: Nurse Practitioner

## 2022-03-26 ENCOUNTER — Encounter (HOSPITAL_COMMUNITY): Payer: Self-pay | Admitting: Nurse Practitioner

## 2022-03-26 VITALS — BP 130/72 | HR 63 | Ht 69.0 in | Wt 202.2 lb

## 2022-03-26 DIAGNOSIS — G4733 Obstructive sleep apnea (adult) (pediatric): Secondary | ICD-10-CM | POA: Insufficient documentation

## 2022-03-26 DIAGNOSIS — Z7901 Long term (current) use of anticoagulants: Secondary | ICD-10-CM | POA: Diagnosis not present

## 2022-03-26 DIAGNOSIS — I4819 Other persistent atrial fibrillation: Secondary | ICD-10-CM | POA: Diagnosis not present

## 2022-03-26 DIAGNOSIS — D6869 Other thrombophilia: Secondary | ICD-10-CM

## 2022-03-26 DIAGNOSIS — I2510 Atherosclerotic heart disease of native coronary artery without angina pectoris: Secondary | ICD-10-CM | POA: Diagnosis not present

## 2022-03-26 DIAGNOSIS — I480 Paroxysmal atrial fibrillation: Secondary | ICD-10-CM | POA: Diagnosis not present

## 2022-03-26 DIAGNOSIS — I10 Essential (primary) hypertension: Secondary | ICD-10-CM | POA: Insufficient documentation
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
## 2022-03-31 ENCOUNTER — Encounter: Payer: Self-pay | Admitting: Internal Medicine

## 2022-03-31 NOTE — Progress Notes (Unsigned)
Future Appointments  Date Time Provider Department  04/01/2022 11:30 AM [name], MD [facility]  04/25/2022  3:30 PM Constance Haw, MD [facility]  09/10/2022  3:00 PM [name], MD [facility]  12/25/2022 11:30 AM Darrol Jump, NP [facility]     History of Present Illness:                                                                                                               This very nice 76 y.o. MWM presents for 6  month follow up with HTN,  ASCAD/pAfib, HLD, Pre-Diabetes and Vitamin D Deficiency.  In 2015, Abd CT Scan showed Aortic Atherosclerosis.       Patient is treated for HTN (1996)  & BP has been controlled at home. Today's BP is at goal - 122/68.      In 2015, patient had PCA/Stenting and rotator Ablation. Patient has hx/o pAfib and multiple ablations & CV's and is on maintenance Xarelto.  Patient has had no complaints of any cardiac type chest pain, palpitations, dyspnea / orthopnea / PND, dizziness, claudication, or dependent edema.       Hyperlipidemia is controlled with diet & meds. Patient denies myalgias or other med SE's. Last Lipids were at goal :  Lab Results  Component Value Date   CHOL 121 12/17/2021   HDL 57 12/17/2021   LDLCALC 50 12/17/2021   TRIG 68 12/17/2021   CHOLHDL 2.1 12/17/2021      Also, the patient has history of PreDiabetes  (A1c 5.7% /2011 & 5.9% /2013) and has had no symptoms of reactive hypoglycemia, diabetic polys, paresthesias or visual blurring.  Last A1c was normal & at goal:  Lab Results  Component Value Date   HGBA1C 5.5 09/04/2021                                                                                                                                                   urther, the patient also has history of Vitamin D Deficiency ("27" /2008) and supplements vitamin D without any suspected side-effects. Last vitamin D was at goal:  Lab Results  Component Value Date   VD25OH 100  08/23/2020     Current Outpatient Medications on File Prior to Visit  Medication Sig   diltiazem (CARDIZEM CD) 180 MG 24 hr  capsule Take 1 capsule daily   bisoprolol-hctz 10-6.25 MG tablet Take 1 tablet Daily for BP   VITAMIN D 5000 u Take 10,000 units Daily   ezetimibe (10 MG tablet TAKE 1 TABLET DAILY FOR    MULTIVITAMIN w/MINERALS Take 1 tablet Daily   NITROSTAT 0.4 MG SL tab AS NEEDED FOR CHEST PAIN   REFRESH Place 1 drop into both eyes daily as needed (dry eyes).   rosuvastatin  40 MG tablet Take 1/2 tablet Daily for Cholesterol   XARELTO 20 MG TABS TAKE 1 TABLET ONCE DAILY WITH SUPPER    No Known Allergies   PMHx:   Past Medical History:  Diagnosis Date   Bladder cancer ([place])    Hearing loss of both ears    History of colon polyps    BENIGN   Hyperlipidemia    Hypertension    Mitral regurgitation    OSA on CPAP    PAD (peripheral artery disease) (HCC)    ABI--  RIGHT SIDE NORMAL LEFT SIDE MODERATELY REDUCED W/ DISTAL LEFT SFA STENOSIS//  MILD CALDICATION   PAF (paroxysmal atrial fibrillation) ([place])     a. s/p PVI 2014; b. s/p redo PVI and CTI 01/2015   Vitamin D deficiency      Immunization History  Administered Date(s) Administered   DT  07/22/2003, 12/28/2013   Influenza Split 04/14/2019   Influenza Whole 05/19/2013   Influenza, High Dose  04/08/2017   Influenza,inj,Quad  05/09/2014   Influenza 05/14/2015, 05/01/2016, 05/17/2018   PFIZER-SARS-COV-2 Vacc 08/18/2019, 09/15/2019   Pneumococcal - 13 07/11/2014   Pneumococcal - 23 12/04/2009, 06/10/2016   Tdap 09/22/2013     Past Surgical History:  Procedure Laterality Date   ABLATION OF DYSRHYTHMIC FOCUS  01/26/2015   ATRIAL FIBRILLATION ABLATION N/A 07/27/2012   Procedure: ATRIAL FIBRILLATION ABLATION;  Surgeon: [name], MD;  Location: [place] CATH LAB;  Service: Cardiovascular;  Laterality: N/A;   ATRIAL FIBRILLATION ABLATION N/A 08/25/2018   Procedure: ATRIAL FIBRILLATION ABLATION;  Surgeon: [name], MD;  Location: [place] CV LAB;  Service: Cardiovascular;  Laterality: N/A;   CARDIAC ELECTROPHYSIOLOGY STUDY AND ABLATION  07-27-2012  DR [name]   SUCCESSFUL ABLATION OF A-FIB   CARDIOVERSION  08/06/2012   Procedure: CARDIOVERSION;  Surgeon: [name], MD;  Location: [place];  Service: Cardiovascular;  Laterality: N/A;   CARDIOVERSION N/A 08/25/2014   Procedure: CARDIOVERSION;  Surgeon: Larey Dresser, MD;  Location: [place];  Service: Cardiovascular;  Laterality: N/A;   CARDIOVERSION N/A 11/13/2014   Procedure: CARDIOVERSION;  Surgeon: Sueanne Margarita, MD;  Location: [place] ENDOSCOPY;  Service: Cardiovascular;  Laterality: N/A;   CARDIOVERSION N/A 12/28/2014   Procedure: CARDIOVERSION;  Surgeon: [name], MD;  Location: [place];  Service: Cardiovascular;  Laterality: N/A;   CARDIOVERSION N/A 01/30/2017   Procedure: CARDIOVERSION;  Surgeon: Josue Hector, MD;  Location: [facility] ENDOSCOPY;  Service: Cardiovascular;  Laterality: N/A;   CARDIOVERSION N/A 04/06/2017   Procedure: CARDIOVERSION;  Surgeon: Dorothy Spark, MD;  Location: [facility] ENDOSCOPY;  Service: Cardiovascular;  Laterality: N/A;   CARDIOVERSION N/A 05/25/2018   Procedure: CARDIOVERSION;  Surgeon: Skeet Latch, MD;  Location: [place] ENDOSCOPY;  Service: Cardiovascular;  Laterality: N/A;   CARDIOVERSION N/A 08/09/2018   Procedure: CARDIOVERSION;  Surgeon: Buford Dresser, MD;  Location: [facility] ENDOSCOPY;  Service: Cardiovascular;  Laterality: N/A;   CARDIOVERSION N/A 07/04/2019   Procedure: CARDIOVERSION;  Surgeon: Dorothy Spark, MD;  Location: [place];  Service: Cardiovascular;  Laterality:  N/A;   CARDIOVERSION N/A 09/23/2019   Procedure: CARDIOVERSION;  Surgeon: Dorothy Spark, MD;  Location: [facility] ENDOSCOPY;  Service: Cardiovascular;  Laterality: N/A;   CATARACT EXTRACTION W/ INTRAOCULAR LENS  IMPLANT, BILATERAL     COLONOSCOPY WITH PROPOFOL N/A 02/13/2015   Procedure: COLONOSCOPY WITH  PROPOFOL;  Surgeon: Garlan Fair, MD;  Location: [facility] ENDOSCOPY;  Service: Endoscopy;  Laterality: N/A;   CYSTOSCOPY W/ RETROGRADES Bilateral 04/12/2013   Procedure: CYSTOSCOPY WITH RETROGRADE PYELOGRAM;  Surgeon: [name], MD;  Location: [facility];  Service: Urology;  Laterality: Bilateral;   ELECTROPHYSIOLOGIC STUDY N/A 01/26/2015   Procedure: Atrial Fibrillation Ablation;  Surgeon: [name], MD;  Location: [place] CV LAB;  Service: Cardiovascular;  Laterality: N/A;   JOINT REPLACEMENT     LEFT HEART CATHETERIZATION WITH CORONARY ANGIOGRAM N/A 11/24/2013   Procedure: LEFT HEART CATHETERIZATION WITH CORONARY ANGIOGRAM;  Surgeon: [name], MD;  Location: [facility] CATH LAB;  Service: Cardiovascular;  Laterality: N/A;   PERCUTANEOUS CORONARY ROTOBLATOR INTERVENTION (PCI-R) N/A 11/28/2013   Procedure: PERCUTANEOUS CORONARY ROTOBLATOR INTERVENTION (PCI-R);  Surgeon: [name], MD;  Location: [facility] CATH LAB;  Service: Cardiovascular;  Laterality: N/A;   PERCUTANEOUS CORONARY STENT INTERVENTION (PCI-S)  11/24/2013   Procedure: PERCUTANEOUS CORONARY STENT INTERVENTION (PCI-S);  Surgeon: [name], MD;  Location: [facility] CATH LAB;  Service: Cardiovascular;;   TEE WITHOUT CARDIOVERSION  07/26/2012   Procedure: TRANSESOPHAGEAL ECHOCARDIOGRAM (TEE);  Surgeon: [name], MD;  Location: [facility] ENDOSCOPY;  Service: Cardiovascular;  Laterality: N/A;   TEE WITHOUT CARDIOVERSION N/A 08/25/2014   Procedure: TRANSESOPHAGEAL ECHOCARDIOGRAM (TEE);  Surgeon: Larey Dresser, MD;  Location: [place];  Service: Cardiovascular;  Laterality: N/A;   TEE WITHOUT CARDIOVERSION  01/26/2015   Procedure: Transesophageal Echocardiogram (Tee);  Surgeon: [name], MD;  Location: [place] CV LAB;  Service: Cardiovascular;;   TEE WITHOUT CARDIOVERSION N/A 07/04/2019   Procedure: TRANSESOPHAGEAL ECHOCARDIOGRAM (TEE);  Surgeon: Dorothy Spark, MD;  Location: [facility]  ENDOSCOPY;  Service: Cardiovascular;  Laterality: N/A;   TONSILLECTOMY  age 72   TOTAL HIP ARTHROPLASTY  07/25/2011   Procedure: TOTAL HIP ARTHROPLASTY ANTERIOR APPROACH;  Surgeon: Mcarthur Rossetti;  Location: [facility]S;  Service: Orthopedics;  Laterality: Right;   TOTAL HIP ARTHROPLASTY Left 02/08/2010   TRANSURETHRAL RESECTION OF BLADDER TUMOR WITH GYRUS (TURBT-GYRUS) N/A 04/12/2013   Procedure: TRANSURETHRAL RESECTION OF BLADDER TUMOR WITH GYRUS (TURBT-GYRUS);  Surgeon: [name], MD;  Location: [facility];  Service: Urology;  Laterality: N/A;    FHx:    Reviewed / unchanged  SHx:    Reviewed / unchanged   Systems Review:  Constitutional: Denies fever, chills, wt changes, headaches, insomnia, fatigue, night sweats, change in appetite. Eyes: Denies redness, blurred vision, diplopia, discharge, itchy, watery eyes.  ENT: Denies discharge, congestion, post nasal drip, epistaxis, sore throat, earache, hearing loss, dental pain, tinnitus, vertigo, sinus pain, snoring.  CV: Denies chest pain, palpitations, irregular heartbeat, syncope, dyspnea, diaphoresis, orthopnea, PND, claudication or edema. Respiratory: denies cough, dyspnea, DOE, pleurisy, hoarseness, laryngitis, wheezing.  Gastrointestinal: Denies dysphagia, odynophagia, heartburn, reflux, water brash, abdominal pain or cramps, nausea, vomiting, bloating, diarrhea, constipation, hematemesis, melena, hematochezia  or hemorrhoids. Genitourinary: Denies dysuria, frequency, urgency, nocturia, hesitancy, discharge, hematuria or flank pain. Musculoskeletal: Denies arthralgias, myalgias, stiffness, jt. swelling, pain, limping or strain/sprain.  Skin: Denies pruritus, rash, hives, warts, acne, eczema or change in skin lesion(s). Neuro: No weakness, tremor, incoordination, spasms, paresthesia or pain. Psychiatric: Denies confusion,  memory loss or sensory loss. Endo: Denies change in weight, skin or hair change.   Heme/Lymph: No excessive bleeding, bruising or enlarged lymph nodes.  Physical Exam  There were no vitals taken for this visit.  Appears  well nourished, well groomed  and in no distress.  Eyes: PERRLA, EOMs, conjunctiva no swelling or erythema. Sinuses: No frontal/maxillary tenderness ENT/Mouth: EAC's clear, TM's nl w/o erythema, bulging. Nares clear w/o erythema, swelling, exudates. Oropharynx clear without erythema or exudates. Oral hygiene is good. Tongue normal, non obstructing. Hearing intact.  Neck: Supple. Thyroid not palpable. Car 2+/2+ without bruits, nodes or JVD. Chest: Respirations nl with BS clear & equal w/o rales, rhonchi, wheezing or stridor.  Cor: Heart sounds normal w/ regular rate and rhythm without sig. murmurs, gallops, clicks or rubs. Peripheral pulses normal and equal  without edema.  Abdomen: Soft & bowel sounds normal. Non-tender w/o guarding, rebound, hernias, masses or organomegaly.  Lymphatics: Unremarkable.  Musculoskeletal: Full ROM all peripheral extremities, joint stability, 5/5 strength and normal gait.  Skin: Warm, dry without exposed rashes, lesions or ecchymosis apparent.  Neuro: Cranial nerves intact, reflexes equal bilaterally. Sensory-motor testing grossly intact. Tendon reflexes grossly intact.  Pysch: Alert & oriented x 3.  Insight and judgement nl & appropriate. No ideations.  Assessment and Plan:  1. Essential hypertension  - Continue medication, monitor blood pressure at home.  - Continue DASH diet.  Reminder to go to the ER if any CP,  SOB, nausea, dizziness, severe HA, changes vision/speech.   - CBC with Differential/Platelet - COMPLETE METABOLIC PANEL WITH GFR - Magnesium - TSH  2. Hyperlipidemia, mixed  - Continue diet/meds, exercise,& lifestyle modifications.  - Continue monitor periodic cholesterol/liver & renal functions    - Lipid panel - TSH  3. Abnormal glucose  - Continue diet, exercise  - Lifestyle modifications.   - Monitor appropriate labs   - Hemoglobin A1c - Insulin, random  4. Vitamin D deficiency  - Continue supplementation.    - VITAMIN D 25 Hydroxy   5. Paroxysmal atrial fibrillation (HCC)  - TSH  6. Atherosclerosis of native coronary artery without angina  pectoris, unspecified whether native or transplanted heart  - Lipid panel  7. Aortic atherosclerosis ([place]) by Abd CT Scan in 2015  - Lipid panel  8. Medication management  - CBC with Differential/Platelet - COMPLETE METABOLIC PANEL WITH GFR - Magnesium - Lipid panel - TSH - Hemoglobin A1c - Insulin, random - VITAMIN D 25 Hydroxy          Discussed  regular exercise, BP monitoring, weight control to achieve/maintain BMI less than 25 and discussed med and SE's. Recommended labs to assess and monitor clinical status with further disposition pending results of labs.  I discussed the assessment and treatment plan with the patient. The patient was provided an opportunity to ask questions and all were answered. The patient agreed with the plan and demonstrated an understanding of the instructions.  I provided over 30 minutes of exam, counseling, chart review and  complex critical decision making.        The patient was advised to call back or seek an in-person evaluation if the symptoms worsen or if the condition fails to improve as anticipated.   Kirtland Bouchard, MD

## 2022-03-31 NOTE — Patient Instructions (Signed)

## 2022-04-01 ENCOUNTER — Ambulatory Visit: Payer: Medicare Other | Admitting: Internal Medicine

## 2022-04-01 ENCOUNTER — Encounter: Payer: Self-pay | Admitting: Internal Medicine

## 2022-04-01 VITALS — BP 118/68 | HR 57 | Temp 97.9°F | Resp 16 | Ht 69.0 in | Wt 206.4 lb

## 2022-04-01 DIAGNOSIS — I10 Essential (primary) hypertension: Secondary | ICD-10-CM | POA: Diagnosis not present

## 2022-04-01 DIAGNOSIS — R7309 Other abnormal glucose: Secondary | ICD-10-CM

## 2022-04-01 DIAGNOSIS — I700 Atherosclerosis of aorta: Secondary | ICD-10-CM

## 2022-04-01 DIAGNOSIS — Z79899 Other long term (current) drug therapy: Secondary | ICD-10-CM

## 2022-04-01 DIAGNOSIS — I2510 Atherosclerotic heart disease of native coronary artery without angina pectoris: Secondary | ICD-10-CM

## 2022-04-01 DIAGNOSIS — I480 Paroxysmal atrial fibrillation: Secondary | ICD-10-CM

## 2022-04-01 DIAGNOSIS — E782 Mixed hyperlipidemia: Secondary | ICD-10-CM

## 2022-04-01 DIAGNOSIS — E559 Vitamin D deficiency, unspecified: Secondary | ICD-10-CM

## 2022-04-01 DIAGNOSIS — Z136 Encounter for screening for cardiovascular disorders: Secondary | ICD-10-CM

## 2022-04-02 LAB — CBC WITH DIFFERENTIAL/PLATELET
Absolute Monocytes: 431 cells/uL (ref 200–950)
Basophils Absolute: 41 cells/uL (ref 0–200)
Basophils Relative: 0.7 %
Eosinophils Absolute: 218 cells/uL (ref 15–500)
Eosinophils Relative: 3.7 %
HCT: 44.5 % (ref 38.5–50.0)
Hemoglobin: 15.3 g/dL (ref 13.2–17.1)
Lymphs Abs: 1151 cells/uL (ref 850–3900)
MCH: 31.1 pg (ref 27.0–33.0)
MCHC: 34.4 g/dL (ref 32.0–36.0)
MCV: 90.4 fL (ref 80.0–100.0)
MPV: 9.7 fL (ref 7.5–12.5)
Monocytes Relative: 7.3 %
Neutro Abs: 4059 cells/uL (ref 1500–7800)
Neutrophils Relative %: 68.8 %
Platelets: 189 10*3/uL (ref 140–400)
RBC: 4.92 10*6/uL (ref 4.20–5.80)
RDW: 12.4 % (ref 11.0–15.0)
Total Lymphocyte: 19.5 %
WBC: 5.9 10*3/uL (ref 3.8–10.8)

## 2022-04-02 LAB — COMPLETE METABOLIC PANEL WITH GFR
AG Ratio: 2 (calc) (ref 1.0–2.5)
ALT: 20 U/L (ref 9–46)
AST: 19 U/L (ref 10–35)
Albumin: 4.4 g/dL (ref 3.6–5.1)
Alkaline phosphatase (APISO): 53 U/L (ref 35–144)
BUN: 11 mg/dL (ref 7–25)
CO2: 27 mmol/L (ref 20–32)
Calcium: 9.7 mg/dL (ref 8.6–10.3)
Chloride: 109 mmol/L (ref 98–110)
Creat: 0.86 mg/dL (ref 0.70–1.28)
Globulin: 2.2 g/dL (calc) (ref 1.9–3.7)
Glucose, Bld: 90 mg/dL (ref 65–99)
Potassium: 4.2 mmol/L (ref 3.5–5.3)
Sodium: 143 mmol/L (ref 135–146)
Total Bilirubin: 1.5 mg/dL — ABNORMAL HIGH (ref 0.2–1.2)
Total Protein: 6.6 g/dL (ref 6.1–8.1)
eGFR: 90 mL/min/{1.73_m2} (ref >=60)

## 2022-04-02 LAB — HEMOGLOBIN A1C
Hgb A1c MFr Bld: 5.4 % of total Hgb (ref <5.7)
Mean Plasma Glucose: 108 mg/dL
eAG (mmol/L): 6 mmol/L

## 2022-04-02 LAB — LIPID PANEL
Cholesterol: 133 mg/dL (ref <200)
HDL: 56 mg/dL (ref >=40)
LDL Cholesterol (Calc): 63 mg/dL (calc)
Non-HDL Cholesterol (Calc): 77 mg/dL (calc) (ref <130)
Total CHOL/HDL Ratio: 2.4 (calc) (ref <5.0)
Triglycerides: 68 mg/dL (ref <150)

## 2022-04-02 LAB — INSULIN, RANDOM: Insulin: 12.2 u[IU]/mL

## 2022-04-02 LAB — VITAMIN D 25 HYDROXY (VIT D DEFICIENCY, FRACTURES): Vit D, 25-Hydroxy: 97 ng/mL (ref 30–100)

## 2022-04-02 LAB — MAGNESIUM: Magnesium: 2.2 mg/dL (ref 1.5–2.5)

## 2022-04-02 LAB — TSH: TSH: 1 mIU/L (ref 0.40–4.50)

## 2022-04-02 NOTE — Progress Notes (Signed)
<><><><><><><><><><><><><><><><><><><><><><><><><><><><><><><><><> <><><><><><><><><><><><><><><><><><><><><><><><><><><><><><><><><> -   Test results slightly outside the reference range are not unusual. If there is anything important, I will review this with you,  otherwise it is considered normal test values.  If you have further questions,  please do not hesitate to contact me at the office or via My Chart.  <><><><><><><><><><><><><><><><><><><><><><><><><><><><><><><><><> <><><><><><><><><><><><><><><><><><><><><><><><><><><><><><><><><>  -  Total Chol = 133   &   LDL Chol = 63     -    Both    Excellent   - Very low risk for Heart Attack  / Stroke <><><><><><><><><><><><><><><><><><><><><><><><><><><><><><><><><>  -  A1c - Normal - No Diabetes  - Great  ! <><><><><><><><><><><><><><><><><><><><><><><><><><><><><><><><><>  -  All Else - CBC - Kidneys - Electrolytes - Liver - Magnesium & Thyroid    - all  Normal / OK <><><><><><><><><><><><><><><><><><><><><><><><><><><><><><><><><> <><><><><><><><><><><><><><><><><><><><><><><><><><><><><><><><><>

## 2022-04-25 ENCOUNTER — Ambulatory Visit: Payer: Medicare Other | Admitting: Cardiology

## 2022-04-29 ENCOUNTER — Other Ambulatory Visit: Payer: Self-pay | Admitting: Internal Medicine

## 2022-04-29 DIAGNOSIS — E782 Mixed hyperlipidemia: Secondary | ICD-10-CM

## 2022-04-29 DIAGNOSIS — I2510 Atherosclerotic heart disease of native coronary artery without angina pectoris: Secondary | ICD-10-CM

## 2022-04-29 NOTE — Telephone Encounter (Signed)
This is a A-Fib clinic pt 

## 2022-05-02 ENCOUNTER — Encounter: Payer: Self-pay | Admitting: Cardiology

## 2022-05-02 ENCOUNTER — Ambulatory Visit: Payer: Medicare Other | Attending: Cardiology | Admitting: Cardiology

## 2022-05-02 VITALS — BP 128/78 | HR 59 | Ht 69.0 in | Wt 206.0 lb

## 2022-05-02 DIAGNOSIS — D6869 Other thrombophilia: Secondary | ICD-10-CM

## 2022-05-02 DIAGNOSIS — I480 Paroxysmal atrial fibrillation: Secondary | ICD-10-CM

## 2022-05-02 NOTE — Patient Instructions (Signed)
Medication Instructions:  Your physician recommends that you continue on your current medications as directed. Please refer to the Current Medication list given to you today.  *If you need a refill on your cardiac medications before your next appointment, please call your pharmacy*  Follow-Up: At [facility], you and your health needs are our priority.  As part of our continuing mission to provide you with exceptional heart care, we have created designated Provider Care Teams.  These Care Teams include your primary Cardiologist (physician) and Advanced Practice Providers (APPs -  Physician Assistants and Nurse Practitioners) who all work together to provide you with the care you need, when you need it.  Your next appointment:   3 month(s)  The format for your next appointment:   In Person  Provider:   You may see Allegra Lai, MD or one of the following Advanced Practice Providers on your designated Care Team:   Tommye Standard, [place] Legrand Como "Jonni Sanger" Chalmers Cater, [place]    Other Instructions You have been referred to Dr. Tenny Craw  Important Information About [place]

## 2022-05-02 NOTE — Progress Notes (Signed)
Electrophysiology Office Note   Date:  05/02/2022   ID:  Damon [name], DOB 1946-05-10, MRN [id number]  PCP:  [name], MD  Cardiologist:   Primary Electrophysiologist:  Farin Buhman Meredith Leeds, MD    Chief Complaint: AF   History of Present Illness: Damon [name] is a 76 y.o. male who is being seen today for the evaluation of AF at the request of [name], MD. Presenting today for electrophysiology evaluation.  He has a history significant for hypertension, hyperlipidemia, obstructive sleep apnea, peripheral arterial disease, atrial fibrillation.  He is status post ablation with PVI in 2014, PVI, posterior wall isolation in 2020.  He presented to atrial fibrillation clinic 03/11/2022 after going into atrial fibrillation.  He had been cutting down trees at that time.  He had a cardioversion.  On follow-up in A-fib clinic, he remained in sinus rhythm.  Today, he denies symptoms of palpitations, chest pain, shortness of breath, orthopnea, PND, lower extremity edema, claudication, dizziness, presyncope, syncope, bleeding, or neurologic sequela. The patient is tolerating medications without difficulties.    Past Medical History:  Diagnosis Date   Bladder cancer ([place])    Hearing loss of both ears    History of colon polyps    BENIGN   Hyperlipidemia    Hypertension    Mitral regurgitation    OSA on CPAP    PAD (peripheral artery disease) ([facility])    ABI--  RIGHT SIDE NORMAL LEFT SIDE MODERATELY REDUCED W/ DISTAL LEFT SFA STENOSIS//  MILD CALDICATION   PAF (paroxysmal atrial fibrillation) ([place])     a. s/p PVI 2014; b. s/p redo PVI and CTI 01/2015   Vitamin D deficiency    Past Surgical History:  Procedure Laterality Date   ABLATION OF DYSRHYTHMIC FOCUS  01/26/2015   ATRIAL FIBRILLATION ABLATION N/A 07/27/2012   Procedure: ATRIAL FIBRILLATION ABLATION;  Surgeon: [name], MD;  Location: [place] CATH LAB;  Service: Cardiovascular;  Laterality: N/A;   ATRIAL  FIBRILLATION ABLATION N/A 08/25/2018   Procedure: ATRIAL FIBRILLATION ABLATION;  Surgeon: [name], MD;  Location: [place] CV LAB;  Service: Cardiovascular;  Laterality: N/A;   CARDIAC ELECTROPHYSIOLOGY STUDY AND ABLATION  07-27-2012  DR [name]   SUCCESSFUL ABLATION OF A-FIB   CARDIOVERSION  08/06/2012   Procedure: CARDIOVERSION;  Surgeon: [name], MD;  Location: [place];  Service: Cardiovascular;  Laterality: N/A;   CARDIOVERSION N/A 08/25/2014   Procedure: CARDIOVERSION;  Surgeon: Larey Dresser, MD;  Location: [place];  Service: Cardiovascular;  Laterality: N/A;   CARDIOVERSION N/A 11/13/2014   Procedure: CARDIOVERSION;  Surgeon: Sueanne Margarita, MD;  Location: [place] ENDOSCOPY;  Service: Cardiovascular;  Laterality: N/A;   CARDIOVERSION N/A 12/28/2014   Procedure: CARDIOVERSION;  Surgeon: [name], MD;  Location: [place];  Service: Cardiovascular;  Laterality: N/A;   CARDIOVERSION N/A 01/30/2017   Procedure: CARDIOVERSION;  Surgeon: Josue Hector, MD;  Location: [facility] ENDOSCOPY;  Service: Cardiovascular;  Laterality: N/A;   CARDIOVERSION N/A 04/06/2017   Procedure: CARDIOVERSION;  Surgeon: Dorothy Spark, MD;  Location: [facility] ENDOSCOPY;  Service: Cardiovascular;  Laterality: N/A;   CARDIOVERSION N/A 05/25/2018   Procedure: CARDIOVERSION;  Surgeon: Skeet Latch, MD;  Location: [facility] ENDOSCOPY;  Service: Cardiovascular;  Laterality: N/A;   CARDIOVERSION N/A 08/09/2018   Procedure: CARDIOVERSION;  Surgeon: Buford Dresser, MD;  Location: [facility] ENDOSCOPY;  Service: Cardiovascular;  Laterality: N/A;   CARDIOVERSION N/A 07/04/2019   Procedure: CARDIOVERSION;  Surgeon: Dorothy Spark, MD;  Location:  [place] ENDOSCOPY;  Service: Cardiovascular;  Laterality: N/A;   CARDIOVERSION N/A 09/23/2019   Procedure: CARDIOVERSION;  Surgeon: Dorothy Spark, MD;  Location: [place];  Service: Cardiovascular;  Laterality: N/A;   CARDIOVERSION N/A 08/07/2021    Procedure: CARDIOVERSION;  Surgeon: Sueanne Margarita, MD;  Location: [facility] ENDOSCOPY;  Service: Cardiovascular;  Laterality: N/A;   CARDIOVERSION N/A 03/17/2022   Procedure: CARDIOVERSION;  Surgeon: Sueanne Margarita, MD;  Location: [facility] ENDOSCOPY;  Service: Cardiovascular;  Laterality: N/A;   CATARACT EXTRACTION W/ INTRAOCULAR LENS  IMPLANT, BILATERAL     COLONOSCOPY WITH PROPOFOL N/A 02/13/2015   Procedure: COLONOSCOPY WITH PROPOFOL;  Surgeon: Garlan Fair, MD;  Location: [facility] ENDOSCOPY;  Service: Endoscopy;  Laterality: N/A;   CYSTOSCOPY W/ RETROGRADES Bilateral 04/12/2013   Procedure: CYSTOSCOPY WITH RETROGRADE PYELOGRAM;  Surgeon: [name], MD;  Location: [facility];  Service: Urology;  Laterality: Bilateral;   CYSTOSCOPY WITH RETROGRADE PYELOGRAM, URETEROSCOPY AND STENT PLACEMENT  03/06/2021   Procedure: CYSTOSCOPY WITH BILATERAL RETROGRADE PYELOGRAM, LEFT URETEROSCOPY AND STENT PLACEMENT;  Surgeon: [name], MD;  Location: [facility];  Service: Urology;;  1 HR   ELECTROPHYSIOLOGIC STUDY N/A 01/26/2015   Procedure: Atrial Fibrillation Ablation;  Surgeon: [name], MD;  Location: [place] CV LAB;  Service: Cardiovascular;  Laterality: N/A;   HOLMIUM LASER APPLICATION Left 9/62/9528   Procedure: HOLMIUM LASER APPLICATION;  Surgeon: [name], MD;  Location: [facility];  Service: Urology;  Laterality: Left;   JOINT REPLACEMENT     LEFT HEART CATHETERIZATION WITH CORONARY ANGIOGRAM N/A 11/24/2013   Procedure: LEFT HEART CATHETERIZATION WITH CORONARY ANGIOGRAM;  Surgeon: [name], MD;  Location: [facility] CATH LAB;  Service: Cardiovascular;  Laterality: N/A;   PERCUTANEOUS CORONARY ROTOBLATOR INTERVENTION (PCI-R) N/A 11/28/2013   Procedure: PERCUTANEOUS CORONARY ROTOBLATOR INTERVENTION (PCI-R);  Surgeon: [name], MD;  Location: [facility] CATH LAB;  Service: Cardiovascular;  Laterality: N/A;   PERCUTANEOUS CORONARY  STENT INTERVENTION (PCI-S)  11/24/2013   Procedure: PERCUTANEOUS CORONARY STENT INTERVENTION (PCI-S);  Surgeon: [name], MD;  Location: [facility] CATH LAB;  Service: Cardiovascular;;   TEE WITHOUT CARDIOVERSION  07/26/2012   Procedure: TRANSESOPHAGEAL ECHOCARDIOGRAM (TEE);  Surgeon: [name], MD;  Location: [facility] ENDOSCOPY;  Service: Cardiovascular;  Laterality: N/A;   TEE WITHOUT CARDIOVERSION N/A 08/25/2014   Procedure: TRANSESOPHAGEAL ECHOCARDIOGRAM (TEE);  Surgeon: Larey Dresser, MD;  Location: [place];  Service: Cardiovascular;  Laterality: N/A;   TEE WITHOUT CARDIOVERSION  01/26/2015   Procedure: Transesophageal Echocardiogram (Tee);  Surgeon: [name], MD;  Location: [place] CV LAB;  Service: Cardiovascular;;   TEE WITHOUT CARDIOVERSION N/A 07/04/2019   Procedure: TRANSESOPHAGEAL ECHOCARDIOGRAM (TEE);  Surgeon: Dorothy Spark, MD;  Location: [facility] ENDOSCOPY;  Service: Cardiovascular;  Laterality: N/A;   TONSILLECTOMY  age 59   TOTAL HIP ARTHROPLASTY  07/25/2011   Procedure: TOTAL HIP ARTHROPLASTY ANTERIOR APPROACH;  Surgeon: Mcarthur Rossetti;  Location: [facility] ORS;  Service: Orthopedics;  Laterality: Right;   TOTAL HIP ARTHROPLASTY Left 02/08/2010   TRANSURETHRAL RESECTION OF BLADDER TUMOR WITH GYRUS (TURBT-GYRUS) N/A 04/12/2013   Procedure: TRANSURETHRAL RESECTION OF BLADDER TUMOR WITH GYRUS (TURBT-GYRUS);  Surgeon: [name], MD;  Location: [facility];  Service: Urology;  Laterality: N/A;     Current Outpatient Medications  Medication Sig Dispense Refill   bisoprolol-hydrochlorothiazide (ZIAC) 10-6.25 MG tablet Take  1 tablet  every Morning  for BP (Patient taking differently: Take 1 tablet by mouth  daily at 6 PM.) 90 tablet 3   Cholecalciferol (VITAMIN D3) 125 MCG (5000 UT) CAPS Take 10,000 units Daily 30 capsule    diltiazem (CARDIZEM CD) 180 MG 24 hr capsule Take 1 capsule (180 mg total) by mouth daily. 30 capsule 3   ezetimibe  (ZETIA) 10 MG tablet Take  1 tablet  Daily  for Cholesterol                                                                         /                                      TAKE                         BY                               MOUTH 90 tablet 3   Multiple Vitamin (MULTIVITAMIN WITH MINERALS) TABS tablet Take 1 tablet Daily     nitroGLYCERIN (NITROSTAT) 0.4 MG SL tablet DISSOLVE ONE TABLET UNDER THE TONGUE EVERY 5 MINUTES AS NEEDED FOR CHEST PAIN.DO NOT EXCEED A TOTAL OF 3 DOSES IN 15 MINUTES 25 tablet 6   Polyvinyl Alcohol-Povidone (REFRESH OP) Place 1 drop into both eyes 3 (three) times daily as needed (dry/irritated eyes.).     rivaroxaban (XARELTO) 20 MG TABS tablet Take 1 tablet (20 mg total) by mouth daily with supper. 90 tablet 2   rosuvastatin (CRESTOR) 40 MG tablet Take 1/2 tablet Daily for Cholesterol (Patient taking differently: Take 20 mg by mouth at bedtime. Take 1/2 tablet Daily for Cholesterol) 45 tablet 3   No current facility-administered medications for this visit.    Allergies:   Patient has no known allergies.   Social History:  The patient  reports that he quit smoking about 40 years ago. His smoking use included cigarettes. He has a 20.00 pack-year smoking history. He has never used smokeless tobacco. He reports that he does not drink alcohol and does not use drugs.   Family History:  The patient's family history includes Heart attack in his mother; Heart disease in his father and mother; Heart failure in his father.    ROS:  Please see the history of present illness.   Otherwise, review of systems is positive for none.   All other systems are reviewed and negative.    PHYSICAL EXAM: VS:  BP 128/78   Pulse (!) 59   Ht '5\' 9"'$  (1.753 m)   Wt 206 lb (93.4 kg)   SpO2 94%   BMI 30.42 kg/m  , BMI Body mass index is 30.42 kg/m. GEN: Well nourished, well developed, in no acute distress  HEENT: normal  Neck: no JVD, carotid bruits, or masses Cardiac: RRR; no murmurs,  rubs, or gallops,no edema  Respiratory:  clear to auscultation bilaterally, normal work of breathing GI: soft, nontender, nondistended, + BS MS: no deformity or atrophy  Skin: warm and dry Neuro:  Strength and sensation are intact Psych: euthymic mood, full affect  EKG:  EKG is not ordered today. Personal review of the ekg ordered 03/26/22 shows sinus rhythm, rate 63  Recent Labs: 04/01/2022: ALT 20; BUN 11; Creat 0.86; Hemoglobin 15.3; Magnesium 2.2; Platelets 189; Potassium 4.2; Sodium 143; TSH 1.00    Lipid Panel     Component Value Date/Time   CHOL 133 04/01/2022 0000   TRIG 68 04/01/2022 0000   HDL 56 04/01/2022 0000   CHOLHDL 2.4 04/01/2022 0000   VLDL 19 01/15/2017 0932   LDLCALC 63 04/01/2022 0000     Wt Readings from Last 3 Encounters:  05/02/22 206 lb (93.4 kg)  04/01/22 206 lb 6.4 oz (93.6 kg)  03/26/22 202 lb 3.2 oz (91.7 kg)      Other studies Reviewed: Additional studies/ records that were reviewed today include: TEE 07/04/19  Review of the above records today demonstrates:   1. Left ventricular ejection fraction, by visual estimation, is 55 to  60%. The left ventricle has normal function. Normal left ventricular size.  Left ventricular septal wall thickness was normal. There is no left  ventricular hypertrophy.   2. Left ventricular diastolic function could not be evaluated.   3. The left ventricle has no regional wall motion abnormalities.   4. Global right ventricle has normal systolic function.The right  ventricular size is normal. No increase in right ventricular wall  thickness.   5. Left atrial size was normal.   6. No thrombus in the left atrium or left atrial appendage.   7. Right atrial size was normal.   8. The mitral valve is normal in structure. Mild mitral valve  regurgitation. No evidence of mitral stenosis.   9. The tricuspid valve is normal in structure. Tricuspid valve  regurgitation is mild.  10. The aortic valve is normal in  structure. Aortic valve regurgitation is  not visualized. No evidence of aortic valve sclerosis or stenosis.  11. The pulmonic valve was normal in structure. Pulmonic valve  regurgitation is not visualized.  12. The inferior vena cava is normal in size with greater than 50%  respiratory variability, suggesting right atrial pressure of 3 mmHg.  13. No intracardiac source of embolism. This study was followed by a  successful cardioversion.    ASSESSMENT AND PLAN:  1.  Persistent atrial fibrillation: Status post ablation in 2014 and again in 2020.  He is unfortunately required 3 cardioversions this year.  Currently on Xarelto 20 mg daily.  CHA2DS2-VASc of 4.  Frustrated that he has had more episodes of atrial fibrillation.  He would like to pursue ablation as opposed to medication management at this time.  I did discuss with him both endocardial versus epicardial ablation.  At this is his third ablation attempt, I think that it would be beneficial for him to discuss an epicardial ablation with our cardiac surgeons.  If he decides that this is not likely his course of therapy, would be amenable to endocardial ablation.  2.  Secondary hypercoagulable state: Currently on Xarelto for atrial fibrillation as above  3.  Hypertension: Currently well controlled  4.  Obstructive sleep apnea: CPAP compliance encouraged  5.  Coronary artery disease: No current chest pain.    Current medicines are reviewed at length with the patient today.   The patient does not have concerns regarding his medicines.  The following changes were made today:  none  Labs/ tests ordered today include:  Orders Placed This Encounter  Procedures   Ambulatory referral to Cardiothoracic Surgery  Disposition:   FU with Catrinia Racicot 3 months  Signed, Maycie Luera Meredith Leeds, MD  05/02/2022 3:53 PM     [place] [address] [place] [address] [phone] (office) [phone]  (fax)

## 2022-05-12 ENCOUNTER — Encounter: Payer: Self-pay | Admitting: Internal Medicine

## 2022-05-13 ENCOUNTER — Encounter: Payer: Self-pay | Admitting: Cardiology

## 2022-05-29 ENCOUNTER — Institutional Professional Consult (permissible substitution) (INDEPENDENT_AMBULATORY_CARE_PROVIDER_SITE_OTHER): Payer: Medicare Other | Admitting: Cardiothoracic Surgery

## 2022-05-29 VITALS — BP 136/72 | HR 61 | Resp 20 | Ht 69.0 in | Wt 200.0 lb

## 2022-05-29 DIAGNOSIS — I480 Paroxysmal atrial fibrillation: Secondary | ICD-10-CM

## 2022-05-29 NOTE — Progress Notes (Addendum)
[address]       [place], [address]             [phone]                    Jettson E Bassette [place] Medical Record #[id number] Date of Birth: 1946/06/05  Referring: Constance Haw, MD Primary Care: [name], MD Primary Cardiologist: Lauree Chandler, MD  Chief Complaint:    Chief Complaint  Patient presents with   Atrial Fibrillation    Surgical consult for possible Epicardial Ablation/ HX of Cardioversions    History of Present Illness:    MIQUEAS WHILDEN 76 y.o. male with persistent atrial fibrillation referred by Dr. Curt Bears for evaluation for minimally invasive epicardial ablation.  AF Started around 2010. Has had 2 catheter ablations (2014, 2020).  He has recurrent AF that causes significant symptoms requiring 3 cardioversions this year.   He has dyspnea when these episodes occur. He denies chest pain similar to what led him to need stents / rotoblator back with Dr. Angelena Form in 2014-2015. Current symptoms seem directly related to being in or out of AF.   He is a former Chief Financial Officer. Had not optimal results with a different surgical procedure in the past (noncardiac).      Past Medical History:  Diagnosis Date   Bladder cancer ([place])    Hearing loss of both ears    History of colon polyps    BENIGN   Hyperlipidemia    Hypertension    Mitral regurgitation    OSA on CPAP    PAD (peripheral artery disease) ([facility])    ABI--  RIGHT SIDE NORMAL LEFT SIDE MODERATELY REDUCED W/ DISTAL LEFT SFA STENOSIS//  MILD CALDICATION   PAF (paroxysmal atrial fibrillation) ([place])     a. s/p PVI 2014; b. s/p redo PVI and CTI 01/2015   Vitamin D deficiency     Past Surgical History:  Procedure Laterality Date   ABLATION OF DYSRHYTHMIC FOCUS  01/26/2015   ATRIAL FIBRILLATION ABLATION N/A 07/27/2012   Procedure: ATRIAL FIBRILLATION ABLATION;  Surgeon: [name], MD;  Location: [place] CATH LAB;  Service: Cardiovascular;  Laterality: N/A;    ATRIAL FIBRILLATION ABLATION N/A 08/25/2018   Procedure: ATRIAL FIBRILLATION ABLATION;  Surgeon: [name], MD;  Location: [place] CV LAB;  Service: Cardiovascular;  Laterality: N/A;   CARDIAC ELECTROPHYSIOLOGY STUDY AND ABLATION  07-27-2012  DR [name]   SUCCESSFUL ABLATION OF A-FIB   CARDIOVERSION  08/06/2012   Procedure: CARDIOVERSION;  Surgeon: [name], MD;  Location: [place];  Service: Cardiovascular;  Laterality: N/A;   CARDIOVERSION N/A 08/25/2014   Procedure: CARDIOVERSION;  Surgeon: Larey Dresser, MD;  Location: [place];  Service: Cardiovascular;  Laterality: N/A;   CARDIOVERSION N/A 11/13/2014   Procedure: CARDIOVERSION;  Surgeon: Sueanne Margarita, MD;  Location: [place] ENDOSCOPY;  Service: Cardiovascular;  Laterality: N/A;   CARDIOVERSION N/A 12/28/2014   Procedure: CARDIOVERSION;  Surgeon: [name], MD;  Location: [place];  Service: Cardiovascular;  Laterality: N/A;   CARDIOVERSION N/A 01/30/2017   Procedure: CARDIOVERSION;  Surgeon: Josue Hector, MD;  Location: [facility] ENDOSCOPY;  Service: Cardiovascular;  Laterality: N/A;   CARDIOVERSION N/A 04/06/2017   Procedure: CARDIOVERSION;  Surgeon: Dorothy Spark, MD;  Location: [facility] ENDOSCOPY;  Service: Cardiovascular;  Laterality: N/A;   CARDIOVERSION N/A 05/25/2018   Procedure: CARDIOVERSION;  Surgeon: Skeet Latch, MD;  Location: [place];  Service: Cardiovascular;  Laterality: N/A;   CARDIOVERSION N/A 08/09/2018   Procedure: CARDIOVERSION;  Surgeon: Buford Dresser, MD;  Location: [place];  Service: Cardiovascular;  Laterality: N/A;   CARDIOVERSION N/A 07/04/2019   Procedure: CARDIOVERSION;  Surgeon: Dorothy Spark, MD;  Location: [place];  Service: Cardiovascular;  Laterality: N/A;   CARDIOVERSION N/A 09/23/2019   Procedure: CARDIOVERSION;  Surgeon: Dorothy Spark, MD;  Location: [facility] ENDOSCOPY;  Service: Cardiovascular;  Laterality: N/A;   CARDIOVERSION N/A 08/07/2021    Procedure: CARDIOVERSION;  Surgeon: Sueanne Margarita, MD;  Location: [facility] ENDOSCOPY;  Service: Cardiovascular;  Laterality: N/A;   CARDIOVERSION N/A 03/17/2022   Procedure: CARDIOVERSION;  Surgeon: Sueanne Margarita, MD;  Location: [facility] ENDOSCOPY;  Service: Cardiovascular;  Laterality: N/A;   CATARACT EXTRACTION W/ INTRAOCULAR LENS  IMPLANT, BILATERAL     COLONOSCOPY WITH PROPOFOL N/A 02/13/2015   Procedure: COLONOSCOPY WITH PROPOFOL;  Surgeon: Garlan Fair, MD;  Location: [facility] ENDOSCOPY;  Service: Endoscopy;  Laterality: N/A;   CYSTOSCOPY W/ RETROGRADES Bilateral 04/12/2013   Procedure: CYSTOSCOPY WITH RETROGRADE PYELOGRAM;  Surgeon: [name], MD;  Location: [facility];  Service: Urology;  Laterality: Bilateral;   CYSTOSCOPY WITH RETROGRADE PYELOGRAM, URETEROSCOPY AND STENT PLACEMENT  03/06/2021   Procedure: CYSTOSCOPY WITH BILATERAL RETROGRADE PYELOGRAM, LEFT URETEROSCOPY AND STENT PLACEMENT;  Surgeon: [name], MD;  Location: [facility];  Service: Urology;;  1 HR   ELECTROPHYSIOLOGIC STUDY N/A 01/26/2015   Procedure: Atrial Fibrillation Ablation;  Surgeon: [name], MD;  Location: [place] CV LAB;  Service: Cardiovascular;  Laterality: N/A;   HOLMIUM LASER APPLICATION Left 9/56/2130   Procedure: HOLMIUM LASER APPLICATION;  Surgeon: [name], MD;  Location: [facility];  Service: Urology;  Laterality: Left;   JOINT REPLACEMENT     LEFT HEART CATHETERIZATION WITH CORONARY ANGIOGRAM N/A 11/24/2013   Procedure: LEFT HEART CATHETERIZATION WITH CORONARY ANGIOGRAM;  Surgeon: [name], MD;  Location: [facility] CATH LAB;  Service: Cardiovascular;  Laterality: N/A;   PERCUTANEOUS CORONARY ROTOBLATOR INTERVENTION (PCI-R) N/A 11/28/2013   Procedure: PERCUTANEOUS CORONARY ROTOBLATOR INTERVENTION (PCI-R);  Surgeon: [name], MD;  Location: [facility] CATH LAB;  Service: Cardiovascular;  Laterality: N/A;   PERCUTANEOUS CORONARY  STENT INTERVENTION (PCI-S)  11/24/2013   Procedure: PERCUTANEOUS CORONARY STENT INTERVENTION (PCI-S);  Surgeon: [name], MD;  Location: [facility] CATH LAB;  Service: Cardiovascular;;   TEE WITHOUT CARDIOVERSION  07/26/2012   Procedure: TRANSESOPHAGEAL ECHOCARDIOGRAM (TEE);  Surgeon: [name], MD;  Location: [facility] ENDOSCOPY;  Service: Cardiovascular;  Laterality: N/A;   TEE WITHOUT CARDIOVERSION N/A 08/25/2014   Procedure: TRANSESOPHAGEAL ECHOCARDIOGRAM (TEE);  Surgeon: Larey Dresser, MD;  Location: [place];  Service: Cardiovascular;  Laterality: N/A;   TEE WITHOUT CARDIOVERSION  01/26/2015   Procedure: Transesophageal Echocardiogram (Tee);  Surgeon: [name], MD;  Location: [place] CV LAB;  Service: Cardiovascular;;   TEE WITHOUT CARDIOVERSION N/A 07/04/2019   Procedure: TRANSESOPHAGEAL ECHOCARDIOGRAM (TEE);  Surgeon: Dorothy Spark, MD;  Location: [facility] ENDOSCOPY;  Service: Cardiovascular;  Laterality: N/A;   TONSILLECTOMY  age 80   TOTAL HIP ARTHROPLASTY  07/25/2011   Procedure: TOTAL HIP ARTHROPLASTY ANTERIOR APPROACH;  Surgeon: Mcarthur Rossetti;  Location: [facility]S;  Service: Orthopedics;  Laterality: Right;   TOTAL HIP ARTHROPLASTY Left 02/08/2010   TRANSURETHRAL RESECTION OF BLADDER TUMOR WITH GYRUS (TURBT-GYRUS) N/A 04/12/2013   Procedure: TRANSURETHRAL RESECTION OF BLADDER TUMOR WITH GYRUS (TURBT-GYRUS);  Surgeon: [name], MD;  Location: [facility];  Service: Urology;  Laterality: N/A;    Family History  Problem Relation Age of Onset   Heart attack Mother    Heart disease Mother    Heart failure Father    Heart disease Father      Social History   Tobacco Use  Smoking Status Former   Packs/day: 2.00   Years: 10.00   Total pack years: 20.00   Types: Cigarettes   Quit date: 09/20/1981   Years since quitting: 40.7  Smokeless Tobacco Never  Tobacco Comments   Former smoker 01/07/22    Social History   Substance and  Sexual Activity  Alcohol Use No   Comment: pt states he has stopped drinking alcohol     No Known Allergies  Current Outpatient Medications  Medication Sig Dispense Refill   bisoprolol-hydrochlorothiazide (ZIAC) 10-6.25 MG tablet Take  1 tablet  every Morning  for BP (Patient taking differently: Take 1 tablet by mouth daily at 6 PM.) 90 tablet 3   Cholecalciferol (VITAMIN D3) 125 MCG (5000 UT) CAPS Take 10,000 units Daily 30 capsule    diltiazem (CARDIZEM CD) 180 MG 24 hr capsule Take 1 capsule (180 mg total) by mouth daily. 30 capsule 3   ezetimibe (ZETIA) 10 MG tablet Take  1 tablet  Daily  for Cholesterol                                                                         /                                      TAKE                         BY                               MOUTH 90 tablet 3   Multiple Vitamin (MULTIVITAMIN WITH MINERALS) TABS tablet Take 1 tablet Daily     nitroGLYCERIN (NITROSTAT) 0.4 MG SL tablet DISSOLVE ONE TABLET UNDER THE TONGUE EVERY 5 MINUTES AS NEEDED FOR CHEST PAIN.DO NOT EXCEED A TOTAL OF 3 DOSES IN 15 MINUTES 25 tablet 6   Polyvinyl Alcohol-Povidone (REFRESH OP) Place 1 drop into both eyes 3 (three) times daily as needed (dry/irritated eyes.).     rivaroxaban (XARELTO) 20 MG TABS tablet Take 1 tablet (20 mg total) by mouth daily with supper. 90 tablet 2   rosuvastatin (CRESTOR) 40 MG tablet Take 1/2 tablet Daily for Cholesterol (Patient taking differently: Take 20 mg by mouth at bedtime. Take 1/2 tablet Daily for Cholesterol) 45 tablet 3   No current facility-administered medications for this visit.    ROS 14 point ROS reviewed and negative except as per HPI   PHYSICAL EXAMINATION: BP 136/72   Pulse 61   Resp 20 Comment: RA  Ht '5\' 9"'$  (1.753 m)   Wt 200 lb (90.7 kg)   SpO2 95%   BMI 29.53 kg/m   Gen: NAD CV: irreg irreg Neuro: Alert and oriented Resp: Nonlaboured Abd: Soft, ntnd Extr: [employer]  Diagnostic Studies & Laboratory data:     Recent  Radiology Findings:   No results found.     I have independently reviewed the above radiology studies  and reviewed the findings with the patient.   Recent Lab Findings: Lab Results  Component Value Date   WBC 5.9 04/01/2022   HGB 15.3 04/01/2022   HCT 44.5 04/01/2022   PLT 189 04/01/2022   GLUCOSE 90 04/01/2022   CHOL 133 04/01/2022   TRIG 68 04/01/2022   HDL 56 04/01/2022   LDLCALC 63 04/01/2022   ALT 20 04/01/2022   AST 19 04/01/2022   NA 143 04/01/2022   K 4.2 04/01/2022   CL 109 04/01/2022   CREATININE 0.86 04/01/2022   BUN 11 04/01/2022   CO2 27 04/01/2022   TSH 1.00 04/01/2022   INR 1.1 (H) 11/16/2013   HGBA1C 5.4 04/01/2022     Assessment / Plan:   69M with persistent AF referred by Dr. Curt Bears for consideration of Convergent procedure. AF Started around 2010. Has had 2 catheter ablations (2014, 2020).  He has recurrent AF that causes significant symptoms requiring 3 cardioversions this year.    I told him we are getting started with Convergent here and he would be on of the initial patients. I offered him procedure to be later if our experience here if that's what he prefers. It seems as of now he wants to get this done.     Typical workup includes: CTA coronary for patients > 81 yo. - He early has coronary stenting calcification in LM and prox LAD (needed rotoblator in the past), confirmed with CTA readers this would not help in his case.  Echo in the last year  We did discuss that he had stents s~8 years ago and could his current symptoms be more related to ischemia. He finds them to be directly when he goes into AF he has escalating DOE symptoms, not related to exertion. I wouldn't exclude though if he continues to have symptoms while in sinus after this is completed getting stents looked at.   We broadly discussed the rationale for the convergent approach.  Standard recovery is walking around day after procedure, discharge to home postop day 2.  I see  convergent (epicardial ablation) patients postoperatively back in the clinic in 1 week and 1 month. Patients generally have a full recovery by 3 weeks.    Risks/benefits/alternatives discussed at length. All questions were asked an answered. Risks are <<1% mortality, 2% morbidity (bleeding, infection, damage to surrounding structures, myocardial infarction given prior stents) and >97% standard recovery.   Typically I do these procedures on Mondays, last dose of Eliquis is Saturday morning. I restart AC the night of surgery. If they receive an LAA clip, most patients, but not all, come off their AC 2-6 months after surgery at the discretion of their referring cardiologist. Many ultimately have reductions in the degree of antiarrythmic medications as well. Expected outcome is significantly more freedom from Afib (measured by 90% reduction of time in AF).     I spent 40 minutes counseling the patient face to face.   Pierre Bali Edwena Mayorga 05/29/2022 7:57 PM

## 2022-06-11 ENCOUNTER — Ambulatory Visit (HOSPITAL_COMMUNITY)
Admission: RE | Admit: 2022-06-11 | Discharge: 2022-06-11 | Disposition: A | Discharge status: Home / Self Care | Payer: Medicare Other | Source: Ambulatory Visit | Attending: Nurse Practitioner | Admitting: Nurse Practitioner

## 2022-06-11 ENCOUNTER — Encounter (HOSPITAL_COMMUNITY): Payer: Self-pay | Admitting: Nurse Practitioner

## 2022-06-11 VITALS — BP 134/78 | HR 82 | Ht 69.0 in | Wt 206.6 lb

## 2022-06-11 DIAGNOSIS — Z87891 Personal history of nicotine dependence: Secondary | ICD-10-CM | POA: Insufficient documentation

## 2022-06-11 DIAGNOSIS — I2510 Atherosclerotic heart disease of native coronary artery without angina pectoris: Secondary | ICD-10-CM | POA: Diagnosis not present

## 2022-06-11 DIAGNOSIS — Z79899 Other long term (current) drug therapy: Secondary | ICD-10-CM | POA: Diagnosis not present

## 2022-06-11 DIAGNOSIS — I10 Essential (primary) hypertension: Secondary | ICD-10-CM | POA: Insufficient documentation

## 2022-06-11 DIAGNOSIS — Z7901 Long term (current) use of anticoagulants: Secondary | ICD-10-CM | POA: Insufficient documentation

## 2022-06-11 DIAGNOSIS — Z8249 Family history of ischemic heart disease and other diseases of the circulatory system: Secondary | ICD-10-CM | POA: Insufficient documentation

## 2022-06-11 DIAGNOSIS — D6869 Other thrombophilia: Secondary | ICD-10-CM

## 2022-06-11 DIAGNOSIS — I4819 Other persistent atrial fibrillation: Secondary | ICD-10-CM | POA: Diagnosis present

## 2022-06-11 DIAGNOSIS — G4733 Obstructive sleep apnea (adult) (pediatric): Secondary | ICD-10-CM | POA: Insufficient documentation

## 2022-06-11 NOTE — Patient Instructions (Signed)
Cardioversion scheduled for: Thursday, December 7th Come to afib clinic for labs at [place] at the [employer] and go to admitting at 11   - Do not eat or drink anything after midnight the night prior to your procedure.   - Take all your morning medication (except diabetic medications) with a sip of water prior to arrival.   - If you are on weekly OZEMPIC, TRULICITY, MOUNJARO, OR BYDUREON  Hold medication 7 days prior to scheduled procedure/anesthesia.  Restart medication on the normal dosing day after scheduled procedure/anesthesia   - If you are on daily BYETTA, WEGOVY, VICTOZA, ADLYXIN, OR RYBELSUS:   Hold medication 24 hours prior to scheduled procedure/anesthesia.   Restart medication on the following day after scheduled procedure/anesthesia   For those patients who have a scheduled procedure/anesthesia on the same day of the week as their dose, hold the medication on the day of surgery.  They can take their scheduled dose the week before.  **Patients on the above medications scheduled for elective procedures that have not held the medication for the appropriate amount of time are at risk of cancellation or change in the anesthetic plan.   - You will not be able to drive home after your procedure.    - Do NOT miss any doses of your blood thinner - if you should miss a dose please notify our office immediately.   - If you feel as if you go back into normal rhythm prior to scheduled cardioversion, please notify our office immediately.   If your procedure is canceled in the cardioversion suite you will be charged a cancellation fee.
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
## 2022-06-26 ENCOUNTER — Telehealth: Payer: Self-pay

## 2022-06-26 ENCOUNTER — Encounter (HOSPITAL_COMMUNITY)
Admission: RE | Disposition: A | Discharge status: Home / Self Care | Payer: Self-pay | Source: Home / Self Care | Attending: Internal Medicine

## 2022-06-26 ENCOUNTER — Ambulatory Visit (HOSPITAL_COMMUNITY): Payer: Medicare Other | Admitting: Anesthesiology

## 2022-06-26 ENCOUNTER — Ambulatory Visit (HOSPITAL_BASED_OUTPATIENT_CLINIC_OR_DEPARTMENT_OTHER): Payer: Medicare Other | Admitting: Anesthesiology

## 2022-06-26 ENCOUNTER — Ambulatory Visit (HOSPITAL_COMMUNITY)
Admission: RE | Admit: 2022-06-26 | Discharge: 2022-06-26 | Disposition: A | Discharge status: Home / Self Care | Payer: Medicare Other | Source: Ambulatory Visit | Attending: Nurse Practitioner | Admitting: Nurse Practitioner

## 2022-06-26 ENCOUNTER — Ambulatory Visit (HOSPITAL_COMMUNITY)
Admission: RE | Admit: 2022-06-26 | Discharge: 2022-06-26 | Disposition: A | Discharge status: Home / Self Care | Payer: Medicare Other | Attending: Internal Medicine | Admitting: Internal Medicine

## 2022-06-26 DIAGNOSIS — I4891 Unspecified atrial fibrillation: Secondary | ICD-10-CM | POA: Insufficient documentation

## 2022-06-26 DIAGNOSIS — G4730 Sleep apnea, unspecified: Secondary | ICD-10-CM | POA: Diagnosis not present

## 2022-06-26 DIAGNOSIS — Z7901 Long term (current) use of anticoagulants: Secondary | ICD-10-CM | POA: Insufficient documentation

## 2022-06-26 DIAGNOSIS — I491 Atrial premature depolarization: Secondary | ICD-10-CM | POA: Diagnosis not present

## 2022-06-26 DIAGNOSIS — I2510 Atherosclerotic heart disease of native coronary artery without angina pectoris: Secondary | ICD-10-CM | POA: Insufficient documentation

## 2022-06-26 DIAGNOSIS — I10 Essential (primary) hypertension: Secondary | ICD-10-CM | POA: Diagnosis not present

## 2022-06-26 DIAGNOSIS — I4819 Other persistent atrial fibrillation: Secondary | ICD-10-CM

## 2022-06-26 DIAGNOSIS — Z87891 Personal history of nicotine dependence: Secondary | ICD-10-CM | POA: Diagnosis not present

## 2022-06-26 DIAGNOSIS — I480 Paroxysmal atrial fibrillation: Secondary | ICD-10-CM

## 2022-06-26 HISTORY — PX: CARDIOVERSION: SHX1299

## 2022-06-26 LAB — CBC
HCT: 45.2 % (ref 39.0–52.0)
Hemoglobin: 15.3 g/dL (ref 13.0–17.0)
MCH: 30.7 pg (ref 26.0–34.0)
MCHC: 33.8 g/dL (ref 30.0–36.0)
MCV: 90.6 fL (ref 80.0–100.0)
Platelets: 191 10*3/uL (ref 150–400)
RBC: 4.99 MIL/uL (ref 4.22–5.81)
RDW: 12.5 % (ref 11.5–15.5)
WBC: 6.6 10*3/uL (ref 4.0–10.5)
nRBC: 0 % (ref 0.0–0.2)

## 2022-06-26 LAB — BASIC METABOLIC PANEL
Anion gap: 5 (ref 5–15)
BUN: 12 mg/dL (ref 8–23)
CO2: 26 mmol/L (ref 22–32)
Calcium: 9.3 mg/dL (ref 8.9–10.3)
Chloride: 108 mmol/L (ref 98–111)
Creatinine, Ser: 1.04 mg/dL (ref 0.61–1.24)
GFR, Estimated: >60 mL/min (ref >60)
Glucose, Bld: 100 mg/dL — ABNORMAL HIGH (ref 70–99)
Potassium: 4.5 mmol/L (ref 3.5–5.1)
Sodium: 139 mmol/L (ref 135–145)

## 2022-06-26 LAB — POCT I-STAT, CHEM 8
BUN: 11 mg/dL (ref 8–23)
Calcium, Ion: 1.11 mmol/L — ABNORMAL LOW (ref 1.15–1.40)
Chloride: 108 mmol/L (ref 98–111)
Creatinine, Ser: 0.8 mg/dL (ref 0.61–1.24)
Glucose, Bld: 97 mg/dL (ref 70–99)
HCT: 41 % (ref 39.0–52.0)
Hemoglobin: 13.9 g/dL (ref 13.0–17.0)
Potassium: 4.1 mmol/L (ref 3.5–5.1)
Sodium: 141 mmol/L (ref 135–145)
TCO2: 22 mmol/L (ref 22–32)

## 2022-06-26 SURGERY — CARDIOVERSION
Anesthesia: General

## 2022-06-26 MED ORDER — PROPOFOL 10 MG/ML IV BOLUS
INTRAVENOUS | Status: DC | PRN
  Administered 2022-06-26: 80 mg via INTRAVENOUS

## 2022-06-26 MED ORDER — LIDOCAINE 2% (20 MG/ML) 5 ML SYRINGE
INTRAMUSCULAR | Status: DC | PRN
  Administered 2022-06-26: 40 mg via INTRAVENOUS

## 2022-06-26 MED ORDER — SODIUM CHLORIDE 0.9 % IV SOLN: INTRAVENOUS | Status: DC

## 2022-06-26 NOTE — Transfer of Care (Signed)
Immediate Anesthesia Transfer of Care Note  Patient: Damon [name]  Procedure(s) Performed: CARDIOVERSION  Patient Location: Endoscopy Unit  Anesthesia Type:General  Level of Consciousness: awake, drowsy, and patient cooperative  Airway & Oxygen Therapy: Patient Spontanous Breathing  Post-op Assessment: Report given to RN, Post -op Vital signs reviewed and stable, and Patient moving all extremities X 4  Post vital signs: Reviewed and stable  Last Vitals:  Vitals Value Taken Time  BP    Temp    Pulse    Resp    SpO2      Last Pain:  Vitals:   06/26/22 1053  TempSrc: Oral  PainSc:          Complications: No notable events documented.

## 2022-06-26 NOTE — Anesthesia Postprocedure Evaluation (Signed)
Anesthesia Post Note  Patient: Damon [name]  Procedure(s) Performed: CARDIOVERSION     Patient location during evaluation: Endoscopy Anesthesia Type: General Level of consciousness: awake and alert, patient cooperative and oriented Pain management: pain level controlled Vital Signs Assessment: post-procedure vital signs reviewed and stable Respiratory status: spontaneous breathing, nonlabored ventilation and respiratory function stable Cardiovascular status: blood pressure returned to baseline and stable Postop Assessment: no apparent nausea or vomiting and able to ambulate Anesthetic complications: no   No notable events documented.  Last Vitals:  Vitals:   06/26/22 1220 06/26/22 1230  BP: 111/70 102/65  Pulse: (!) 58 (!) 57  Resp: 18 18  Temp:  (!) (P) 36.3 C  SpO2: 95% 93%    Last Pain:  Vitals:   06/26/22 1230  TempSrc: (P) Temporal  PainSc: (P) 0-No pain                 Kimorah Ridolfi,E. Marsheila Alejo

## 2022-06-26 NOTE — Anesthesia Preprocedure Evaluation (Addendum)
Anesthesia Evaluation  Patient identified by MRN, date of birth, ID band Patient awake    Reviewed: Allergy & Precautions, NPO status , Patient's Chart, lab work & pertinent test results  History of Anesthesia Complications Negative for: history of anesthetic complications  Airway Mallampati: II  TM Distance: >3 FB Neck ROM: Full    Dental  (+) Dental Advisory Given   Pulmonary sleep apnea and Continuous Positive Airway Pressure Ventilation , former smoker   breath sounds clear to auscultation       Cardiovascular hypertension, Pt. on medications (-) angina + CAD, + Cardiac Stents and + Peripheral Vascular Disease  + dysrhythmias Atrial Fibrillation  Rhythm:Irregular Rate:Normal  2020 ECHO: EF 55-60%. The LV has normal function. Normal left ventricular size.  Left ventricular septal wall thickness was normal. There is no LVH. normal RVF, mild MR, mild TR.     Neuro/Psych negative neurological ROS     GI/Hepatic negative GI ROS, Neg liver ROS,,,  Endo/Other  negative endocrine ROS    Renal/GU negative Renal ROS Bladder dysfunction (bladder cancer)      Musculoskeletal   Abdominal   Peds  Hematology xarelto   Anesthesia Other Findings   Reproductive/Obstetrics                             Anesthesia Physical Anesthesia Plan  ASA: 3  Anesthesia Plan: General   Post-op Pain Management: Minimal or no pain anticipated   Induction: Intravenous  PONV Risk Score and Plan: 2 and Treatment may vary due to age or medical condition  Airway Management Planned: Natural Airway and Mask  Additional Equipment: None  Intra-op Plan:   Post-operative Plan:   Informed Consent: I have reviewed the patients History and Physical, chart, labs and discussed the procedure including the risks, benefits and alternatives for the proposed anesthesia with the patient or authorized representative who has  indicated his/her understanding and acceptance.     Dental advisory given  Plan Discussed with: CRNA and Surgeon  Anesthesia Plan Comments:         Anesthesia Quick Evaluation

## 2022-06-26 NOTE — Telephone Encounter (Addendum)
Patient with diagnosis of afib on Xarelto for anticoagulation.    Procedure: colonoscopy Date of procedure: 07/31/22  CHA2DS2-VASc Score = 4  This indicates a 4.8% annual risk of stroke. The patient's score is based upon: CHF History: 0 HTN History: 1 Diabetes History: 0 Stroke History: 0 Vascular Disease History: 1 Age Score: 2 Gender Score: 0  CrCl 3m/min Platelet count 191K  Pt cardioverted today and will not be able to stop anticoagulation for 4 weeks after. Also likely having an ablation in March 2024 and will not be able to stop anticoagulation for 3 weeks prior and 3 months after. Colonoscopy is scheduled in ok timeframe. He will be able to hold his Xarelto for 1-2 days prior as long as no major changes noted at future EP appt 07/24/22.  **This guidance is not considered finalized until pre-operative APP has relayed final recommendations.**

## 2022-06-26 NOTE — Interval H&P Note (Signed)
History and Physical Interval Note:  06/26/2022 12:05 PM  Damon [name]  has presented today for surgery, with the diagnosis of AFIB.  The various methods of treatment have been discussed with the patient and family. After consideration of risks, benefits and other options for treatment, the patient has consented to  Procedure(s): CARDIOVERSION (N/A) as a surgical intervention.  The patient's history has been reviewed, patient examined, no change in status, stable for surgery.  I have reviewed the patient's chart and labs.  Questions were answered to the patient's satisfaction.     Pixie Casino

## 2022-06-26 NOTE — Discharge Instructions (Signed)

## 2022-06-26 NOTE — CV Procedure (Signed)
    CARDIOVERSION NOTE  Procedure: Electrical Cardioversion Indications:  Atrial Fibrillation  Procedure Details:  Consent: Risks of procedure as well as the alternatives and risks of each were explained to the (patient/caregiver).  Consent for procedure obtained.  Time Out: Verified patient identification, verified procedure, site/side was marked, verified correct patient position, special equipment/implants available, medications/allergies/relevent history reviewed, required imaging and test results available.  Performed  Patient placed on cardiac monitor, pulse oximetry, supplemental oxygen as necessary.  Sedation given:  propofol per anesthesia Pacer pads placed anterior and posterior chest.  Cardioverted 1 time(s).  Cardioverted at 200J biphasic.  Impression: Findings: Post procedure EKG shows: NSR Complications: None Patient did tolerate procedure well.  Plan: Successful DCCV with a single 200J Biphasic shock to NSR  Time Spent Directly with the Patient:  30 minutes   Damon Casino, MD, [facility] : [facility], [place] Director of the Advanced Lipid Disorders &  Cardiovascular Risk Reduction Clinic Diplomate of the American Board of Clinical Lipidology Attending Cardiologist  Direct Dial: [phone]  Fax: [phone]  Website:  [name] 06/26/2022, 12:19 PM

## 2022-06-26 NOTE — Telephone Encounter (Signed)
   Name: Damon [name]  DOB: 1946/06/14  MRN: [id number]  Primary Cardiologist: Lauree Chandler, MD  Chart reviewed as part of pre-operative protocol coverage. The patient has an upcoming visit scheduled with Dr. Curt Bears on 07/24/2021 at which time clearance can be addressed in case there are any issues that would impact surgical recommendations.  Colonoscopy is not scheduled until 07/31/2022 as below. I added preop FYI to appointment note so that provider is aware to address at time of outpatient visit.  Per office protocol the cardiology provider should forward their finalized clearance decision and recommendations regarding antiplatelet therapy to the requesting party below.    This message will also be routed to pharmacy pool for input on holding Xarelto as requested below so that this information is available to the clearing provider at time of patient's appointment.   I will route this message as FYI to requesting party and remove this message from the preop box as separate preop APP input not needed at this time.   Please call with any questions.  Lenna Sciara, NP  06/26/2022, 3:22 PM

## 2022-06-26 NOTE — Telephone Encounter (Signed)
...     Pre-operative Risk Assessment    Patient Name: Damon [name]  DOB: 12-Apr-1946 MRN: [id number]      Request for Surgical Clearance    Procedure:   colonoscopy  Date of Surgery:  Clearance 07/31/22                                 Surgeon:  dr Alessandra Bevels [facility] Phone number:  [phone] Fax number:  [phone]   Type of Clearance Requested:   - Medical  - Pharmacy:  Hold Rivaroxaban (Xarelto) hold 2 days prior   Type of Anesthesia:   propofol   Additional requests/questions:    Gwenlyn Found   06/26/2022, 2:09 PM

## 2022-06-29 ENCOUNTER — Encounter (HOSPITAL_COMMUNITY): Payer: Self-pay | Admitting: Internal Medicine

## 2022-07-01 NOTE — Progress Notes (Unsigned)
FOLLOW UP  Assessment and Plan:   Paroxysmal atrial fibrillation (HCC) Recently s/p repeat CV and reports doing well  Continue follow up cardio Continue xarelto, diltiazem, lopressor  CAD/Aortic Atherosclerosis(HCC) Control blood pressure, cholesterol, glucose, increase exercise.  Followed by cardiology No recent chest pain/nitroglycerine use  PAD Control blood pressure, cholesterol, glucose, increase exercise.   Hypertension Well controlled with current medications  Monitor blood pressure at home; patient to call if consistently greater than 130/80 Continue DASH diet.   Reminder to go to the ER if any CP, SOB, nausea, dizziness, severe HA, changes vision/speech, left arm numbness and tingling and jaw pain.  Cholesterol Currently at goal of LDL <70; currently on crestor 20 mg daily, working on lifestyle Continue low cholesterol diet and exercise.  Check lipid panel.   Other abnormal glucose Recent A1Cs well controlled Continue diet and exercise.  Perform daily foot/skin check, notify office of any concerning changes.  Will check A1C per strong patient preference due to recent diet adjustment   Overweight - BMI 27 Long discussion about weight loss, diet, and exercise Recommended diet heavy in fruits and veggies and low in animal meats, cheeses, and dairy products, appropriate calorie intake Discussed ideal weight for height and weight goal (180 lb) Patient will work on reducing portion sizes Will follow up in 3 months  Vitamin D Def Above goal at last visit; continue supplementation to maintain goal of 60-100 Check Vit D level  Medication Management  Continue diet and meds as discussed. Further disposition pending results of labs. Discussed med's effects and SE's.   Over 30 minutes of exam, counseling, chart review, and critical decision making was performed.   Future Appointments  Date Time Provider [place]  07/03/2022 11:00 AM Alycia Rossetti, NP  [facility] None  07/24/2022  3:00 PM Constance Haw, MD [facility]  07/31/2022  9:30 AM Enter, Pierre Bali, MD [facility]  09/10/2022  3:00 PM [name], MD [facility] None  12/25/2022 11:30 AM Darrol Jump, NP [facility] None    ----------------------------------------------------------------------------------------------------------------------  HPI 76 y.o. male  presents for 3 month follow up on hypertension, cholesterol, glucose management, obesity and vitamin D deficiency.   Patient has ASCAD  S/p PCA/stenting followed by [facility] in 2015 (Dr Aundra Dubin).   Patient also has hx/o pAfib (on Xarelto) with RFA  x 2 in 2014/2016 with multiple ablations and CVs, most recently 09/23/2019 by Dr. Meda Coffee. He continues on xarelto for CHADsVasc 3 and cardizem 180 mg and lopressor 12.5 mg BID. Also follows for PAD. He has OSA and on CPAP.   He has a diagnosis of insomnia was on xanax but taking rarely.    In Sept 2014 , Patient had TUR of a Bladder cancer by Dr Tresa Moore and follows routinely.   BMI is There is no height or weight on file to calculate BMI., he has been working on diet and exercise, golfing 2-3 days a week and riding bike some, plans to increase. Has been doing oatmeal for breakfast.  Wt Readings from Last 3 Encounters:  06/26/22 200 lb (90.7 kg)  06/11/22 206 lb 9.6 oz (93.7 kg)  05/29/22 200 lb (90.7 kg)   His blood pressure has been controlled at home, today their BP is    He does workout- [place] and golfing several times a week. He denies chest pain, shortness of breath, dizziness.   He is on cholesterol medication (currently taking crestor 20 mg daily) and denies myalgias. His cholesterol  is at goal. The cholesterol last visit was:   Lab Results  Component Value Date   CHOL 133 04/01/2022   HDL 56 04/01/2022   LDLCALC 63 04/01/2022   TRIG 68 04/01/2022   CHOLHDL 2.4 04/01/2022    He has not been working on diet and exercise for  glucose management, and denies increased appetite, nausea, paresthesia of the feet, polydipsia, polyuria, visual disturbances and vomiting. Last A1C in the office was:  Lab Results  Component Value Date   HGBA1C 5.4 04/01/2022   Patient is on Vitamin D supplement and at goal at recent check:   Lab Results  Component Value Date   VD25OH 97 04/01/2022        Current Medications:  Current Outpatient Medications on File Prior to Visit  Medication Sig   bisoprolol-hydrochlorothiazide (ZIAC) 10-6.25 MG tablet Take  1 tablet  every Morning  for BP   Cholecalciferol (VITAMIN D3) 125 MCG (5000 UT) CAPS Take 10,000 units Daily   diltiazem (CARDIZEM CD) 180 MG 24 hr capsule Take 1 capsule (180 mg total) by mouth daily.   ezetimibe (ZETIA) 10 MG tablet Take  1 tablet  Daily  for Cholesterol                                                                         /                                      TAKE                         BY                               MOUTH   Multiple Vitamin (MULTIVITAMIN WITH MINERALS) TABS tablet Take 1 tablet Daily   nitroGLYCERIN (NITROSTAT) 0.4 MG SL tablet DISSOLVE ONE TABLET UNDER THE TONGUE EVERY 5 MINUTES AS NEEDED FOR CHEST PAIN.DO NOT EXCEED A TOTAL OF 3 DOSES IN 15 MINUTES   Polyvinyl Alcohol-Povidone (REFRESH OP) Place 1 drop into both eyes 3 (three) times daily as needed (dry/irritated eyes.).   rivaroxaban (XARELTO) 20 MG TABS tablet Take 1 tablet (20 mg total) by mouth daily with supper.   rosuvastatin (CRESTOR) 40 MG tablet Take 1/2 tablet Daily for Cholesterol   No current facility-administered medications on file prior to visit.     Allergies:  No Known Allergies   Medical History:  Past Medical History:  Diagnosis Date   Bladder cancer ([place])    Hearing loss of both ears    History of colon polyps    BENIGN   Hyperlipidemia    Hypertension    Mitral regurgitation    OSA on CPAP    PAD (peripheral artery disease) (HCC)    ABI--  RIGHT SIDE  NORMAL LEFT SIDE MODERATELY REDUCED W/ DISTAL LEFT SFA STENOSIS//  MILD CALDICATION   PAF (paroxysmal atrial fibrillation) ([place])     a. s/p PVI 2014; b. s/p redo PVI and CTI 01/2015   Vitamin D deficiency  Family history- Reviewed and unchanged Social history- Reviewed and unchanged   Review of Systems:  Review of Systems  Constitutional:  Negative for malaise/fatigue and weight loss.  HENT:  Negative for hearing loss and tinnitus.   Eyes:  Negative for blurred vision and double vision.  Respiratory:  Negative for cough, shortness of breath and wheezing.   Cardiovascular:  Negative for chest pain, palpitations, orthopnea, claudication and leg swelling.  Gastrointestinal:  Negative for abdominal pain, blood in stool, constipation, diarrhea, heartburn, melena, nausea and vomiting.  Genitourinary: Negative.   Musculoskeletal:  Positive for back pain (chronic intermittent lumbar aching, has seen ortho). Negative for joint pain and myalgias.  Skin:  Negative for rash.  Neurological:  Negative for dizziness, tingling, sensory change, weakness and headaches.  Endo/Heme/Allergies:  Negative for polydipsia.  Psychiatric/Behavioral: Negative.    All other systems reviewed and are negative.    Physical Exam: There were no vitals taken for this visit. Wt Readings from Last 3 Encounters:  06/26/22 200 lb (90.7 kg)  06/11/22 206 lb 9.6 oz (93.7 kg)  05/29/22 200 lb (90.7 kg)   General Appearance: Well nourished, in no apparent distress. Eyes: PERRLA, EOMs, conjunctiva no swelling or erythema Sinuses: No Frontal/maxillary tenderness ENT/Mouth: Ext aud canals clear, TMs without erythema, bulging. No erythema, swelling, or exudate on post pharynx.  Tonsils not swollen or erythematous. Hearing normal.  Neck: Supple, thyroid normal.  Respiratory: Respiratory effort normal, BS equal bilaterally without rales, rhonchi, wheezing or stridor.  Cardio: RRR with no MRGs. Brisk peripheral pulses  without edema.  Abdomen: Soft, + BS.  Non tender, no guarding, rebound, hernias, masses. Lymphatics: Non tender without lymphadenopathy.  Musculoskeletal: Full ROM, 5/5 strength, Normal gait Skin: Warm, dry without rashes, lesions, ecchymosis.  Neuro: Cranial nerves intact. No cerebellar symptoms.  Psych: Awake and oriented X 3, normal affect, Insight and Judgment appropriate.    Alycia Rossetti, NP 1:05 PM [facility]

## 2022-07-03 ENCOUNTER — Ambulatory Visit: Payer: Medicare Other | Admitting: Nurse Practitioner

## 2022-07-03 ENCOUNTER — Encounter: Payer: Self-pay | Admitting: Nurse Practitioner

## 2022-07-03 VITALS — BP 108/58 | HR 61 | Temp 97.3°F | Ht 69.0 in | Wt 208.4 lb

## 2022-07-03 DIAGNOSIS — E559 Vitamin D deficiency, unspecified: Secondary | ICD-10-CM

## 2022-07-03 DIAGNOSIS — R7309 Other abnormal glucose: Secondary | ICD-10-CM

## 2022-07-03 DIAGNOSIS — I700 Atherosclerosis of aorta: Secondary | ICD-10-CM | POA: Diagnosis not present

## 2022-07-03 DIAGNOSIS — R0789 Other chest pain: Secondary | ICD-10-CM

## 2022-07-03 DIAGNOSIS — I10 Essential (primary) hypertension: Secondary | ICD-10-CM

## 2022-07-03 DIAGNOSIS — E782 Mixed hyperlipidemia: Secondary | ICD-10-CM

## 2022-07-03 DIAGNOSIS — I480 Paroxysmal atrial fibrillation: Secondary | ICD-10-CM

## 2022-07-03 DIAGNOSIS — K209 Esophagitis, unspecified without bleeding: Secondary | ICD-10-CM

## 2022-07-03 DIAGNOSIS — I2510 Atherosclerotic heart disease of native coronary artery without angina pectoris: Secondary | ICD-10-CM

## 2022-07-03 DIAGNOSIS — I739 Peripheral vascular disease, unspecified: Secondary | ICD-10-CM | POA: Diagnosis not present

## 2022-07-03 DIAGNOSIS — E663 Overweight: Secondary | ICD-10-CM

## 2022-07-03 DIAGNOSIS — Z79899 Other long term (current) drug therapy: Secondary | ICD-10-CM

## 2022-07-03 MED ORDER — ESOMEPRAZOLE MAGNESIUM 40 MG PO CPDR: DELAYED_RELEASE_CAPSULE | ORAL | 1 refills | Status: DC

## 2022-07-03 MED ORDER — SUCRALFATE 1 G PO TABS: ORAL_TABLET | ORAL | 1 refills | Status: DC

## 2022-07-03 NOTE — Patient Instructions (Signed)
Take Nexium once a day x 14 days Carafate twice a day with meals x 14 days If bno change in symptoms call cardiology  Esophagitis  Esophagitis is inflammation of the esophagus. The esophagus is the tube that carries food from the mouth to the stomach. Esophagitis can cause soreness or pain in the esophagus. This condition can make it difficult and painful to swallow. What are the causes? Most causes of esophagitis are not serious. Common causes of this condition include: Gastroesophageal reflux disease (GERD). This is when stomach contents move back up into the esophagus (reflux). Repeated vomiting. An allergic reaction, especially caused by food allergies (eosinophilic esophagitis). Injury to the esophagus by swallowing large pills with or without water, or swallowing certain types of medicines. Swallowing harmful chemicals, such as household cleaning products. Drinking a lot of alcohol. An infection of the esophagus. This most often occurs in people who have a weakened immune system. Radiation or chemotherapy treatment for cancer. Certain diseases such as sarcoidosis, Crohn's disease, and scleroderma. What are the signs or symptoms? Symptoms of this condition include: Difficult or painful swallowing. Pain with swallowing acidic liquids, such as citrus juices. You may also have pain when you burp. Chest pain and difficulty breathing. Nausea and vomiting. Pain in the abdomen. Weight loss. Ulcers in the mouth and white patches in the mouth (candidiasis). Fever. Coughing up blood or vomiting blood. Stool that is black, tarry, or bright red. How is this diagnosed? This condition may be diagnosed based on your medical history and a physical exam. You may also have other tests, including: A test to examine your esophagus and stomach with a small flexible tube with a camera (endoscopy). A test that measures the acidity level in your esophagus. A test that measures how much pressure is on  your esophagus. A barium swallow or modified barium swallow to show the shape, size, and functioning of your esophagus. Allergy tests. How is this treated? Treatment for this condition depends on the cause of your esophagitis. In some cases, steroids or other medicines may be given to help relieve your symptoms or to treat the underlying cause of your condition. You may have to make some lifestyle changes, such as: Avoiding alcohol. Quitting any products that contain nicotine or tobacco. These products include cigarettes, chewing tobacco, and vaping devices, such as e-cigarettes. If you need help quitting, ask your health care provider. Changing your diet. Exercising. Changing your sleep habits and your sleep environment. Follow these instructions at home: Medicines Take over-the-counter and prescription medicines only as told by your health care provider. Do not take aspirin, ibuprofen, or other NSAIDs unless your health care provider told you to do so. If you have trouble taking pills: Use a pill splitter to decrease the size of the pill. This will decrease the chance of the pill getting stuck or injuring your esophagus. Drink water after you take a pill. Eating and drinking  Avoid foods and drinks that seem to make your symptoms worse. Follow a diet as recommended by your health care provider. This may involve avoiding foods and drinks such as: Coffee and tea, with or without caffeine. Drinks that contain alcohol. Energy drinks and sports drinks. Carbonated drinks or sodas. Chocolate and cocoa. Peppermint and mint flavorings. Garlic and onions. Horseradish. Spicy and acidic foods, including peppers, chili powder, curry powder, vinegar, hot sauces, and barbecue sauce. Citrus fruit juices and citrus fruits, such as oranges, lemons, and limes. Tomato-based foods, such as red sauce, chili, salsa, and pizza  with red sauce. Fried and fatty foods, such as donuts, french fries, potato  chips, and high-fat dressings. High-fat meats, such as hot dogs and fatty cuts of red and white meats, such as rib eye steak, sausage, ham, and bacon. High-fat dairy items, such as whole milk, butter, and cream cheese. Lifestyle Eat small, frequent meals instead of large meals. Avoid drinking large amounts of liquid with your meals. Avoid eating meals during the 2-3 hours before bedtime. Avoid lying down right after you eat. Do not exercise right after you eat. Do not use any products that contain nicotine or tobacco. These products include cigarettes, chewing tobacco, and vaping devices, such as e-cigarettes. If you need help quitting, ask your health care provider. General instructions  Pay attention to any changes in your symptoms. Let your health care provider know about them. Wear loose-fitting clothing. Do not wear anything tight around your waist that causes pressure on your abdomen. Raise (elevate) the head of your bed about 6 inches (15 cm). You may need to use a wedge to do this. Try relaxation strategies such as yoga, deep breathing, or meditation to manage stress. If you need help reducing stress, ask your health care provider. If you are overweight, reduce your weight to an amount that is healthy for you. Ask your health care provider for guidance about a safe weight loss goal. Keep all follow-up visits. This is important. Contact a health care provider if: You have new symptoms. You have unexplained weight loss. You have difficulty swallowing, or it hurts to swallow. You have wheezing or a cough that does not go away. Your symptoms do not improve with treatment. You have frequent heartburn for more than two weeks. Get help right away if: You have sudden severe pain in your arms, neck, jaw, teeth, or back. You suddenly feel sweaty, dizzy, or light-headed. You have chest pain or shortness of breath. You vomit and the vomit is green, yellow, or black, or it looks like blood or  coffee grounds. Your stool is red, bloody, or black. You have a fever. You cannot swallow, drink, or eat. These symptoms may represent a serious problem that is an emergency. Do not wait to see if the symptoms will go away. Get medical help right away. Call your local emergency services (911 in the [place] Do not drive yourself to the hospital. Summary Esophagitis is inflammation of the esophagus. Most causes of esophagitis are not serious. Follow your health care provider's instructions about eating and drinking. Contact a health care provider if you have new symptoms, have weight loss, or coughing that does not stop. Get help right away if you have severe pain in the arms, neck, jaw, teeth, or back, or if you have chest pain, shortness of breath, or fever. This information is not intended to replace advice given to you by your health care provider. Make sure you discuss any questions you have with your health care provider. Document Revised: 01/16/2020 Document Reviewed: 01/16/2020 Elsevier Patient Education  [employer].

## 2022-07-04 LAB — COMPLETE METABOLIC PANEL WITH GFR
AG Ratio: 2 (calc) (ref 1.0–2.5)
ALT: 19 U/L (ref 9–46)
AST: 19 U/L (ref 10–35)
Albumin: 4.3 g/dL (ref 3.6–5.1)
Alkaline phosphatase (APISO): 51 U/L (ref 35–144)
BUN: 14 mg/dL (ref 7–25)
CO2: 29 mmol/L (ref 20–32)
Calcium: 9.6 mg/dL (ref 8.6–10.3)
Chloride: 108 mmol/L (ref 98–110)
Creat: 0.86 mg/dL (ref 0.70–1.28)
Globulin: 2.1 g/dL (calc) (ref 1.9–3.7)
Glucose, Bld: 85 mg/dL (ref 65–99)
Potassium: 4 mmol/L (ref 3.5–5.3)
Sodium: 144 mmol/L (ref 135–146)
Total Bilirubin: 2.1 mg/dL — ABNORMAL HIGH (ref 0.2–1.2)
Total Protein: 6.4 g/dL (ref 6.1–8.1)
eGFR: 89 mL/min/{1.73_m2} (ref >=60)

## 2022-07-04 LAB — CBC WITH DIFFERENTIAL/PLATELET
Absolute Monocytes: 470 cells/uL (ref 200–950)
Basophils Absolute: 43 cells/uL (ref 0–200)
Basophils Relative: 0.7 %
Eosinophils Absolute: 153 cells/uL (ref 15–500)
Eosinophils Relative: 2.5 %
HCT: 42.9 % (ref 38.5–50.0)
Hemoglobin: 14.8 g/dL (ref 13.2–17.1)
Lymphs Abs: 1379 cells/uL (ref 850–3900)
MCH: 30.8 pg (ref 27.0–33.0)
MCHC: 34.5 g/dL (ref 32.0–36.0)
MCV: 89.4 fL (ref 80.0–100.0)
MPV: 9.7 fL (ref 7.5–12.5)
Monocytes Relative: 7.7 %
Neutro Abs: 4057 cells/uL (ref 1500–7800)
Neutrophils Relative %: 66.5 %
Platelets: 182 10*3/uL (ref 140–400)
RBC: 4.8 10*6/uL (ref 4.20–5.80)
RDW: 11.9 % (ref 11.0–15.0)
Total Lymphocyte: 22.6 %
WBC: 6.1 10*3/uL (ref 3.8–10.8)

## 2022-07-04 LAB — LIPID PANEL
Cholesterol: 118 mg/dL (ref <200)
HDL: 61 mg/dL (ref >=40)
LDL Cholesterol (Calc): 40 mg/dL (calc)
Non-HDL Cholesterol (Calc): 57 mg/dL (calc) (ref <130)
Total CHOL/HDL Ratio: 1.9 (calc) (ref <5.0)
Triglycerides: 87 mg/dL (ref <150)

## 2022-07-04 LAB — MAGNESIUM: Magnesium: 2.1 mg/dL (ref 1.5–2.5)

## 2022-07-08 ENCOUNTER — Other Ambulatory Visit: Payer: Self-pay | Admitting: Internal Medicine

## 2022-07-08 DIAGNOSIS — I10 Essential (primary) hypertension: Secondary | ICD-10-CM

## 2022-07-08 DIAGNOSIS — I480 Paroxysmal atrial fibrillation: Secondary | ICD-10-CM

## 2022-07-09 ENCOUNTER — Other Ambulatory Visit: Payer: Self-pay | Admitting: Internal Medicine

## 2022-07-24 ENCOUNTER — Encounter: Payer: Self-pay | Admitting: Cardiology

## 2022-07-24 ENCOUNTER — Ambulatory Visit: Payer: Medicare Other | Admitting: Cardiothoracic Surgery

## 2022-07-24 ENCOUNTER — Ambulatory Visit: Payer: Medicare Other | Attending: Cardiology | Admitting: Cardiology

## 2022-07-24 VITALS — BP 114/68 | HR 62 | Ht 69.0 in | Wt 205.0 lb

## 2022-07-24 DIAGNOSIS — I4819 Other persistent atrial fibrillation: Secondary | ICD-10-CM | POA: Diagnosis not present

## 2022-07-24 DIAGNOSIS — I2510 Atherosclerotic heart disease of native coronary artery without angina pectoris: Secondary | ICD-10-CM

## 2022-07-24 DIAGNOSIS — D6869 Other thrombophilia: Secondary | ICD-10-CM

## 2022-07-24 DIAGNOSIS — I10 Essential (primary) hypertension: Secondary | ICD-10-CM | POA: Diagnosis not present

## 2022-07-24 NOTE — Progress Notes (Signed)
Electrophysiology Office Note   Date:  07/24/2022   ID:  Damon [name], DOB [birth year], MRN [id number]  PCP:  [name], MD  Cardiologist:   Primary Electrophysiologist:  Aissa Lisowski Meredith Leeds, MD    Chief Complaint: AF   History of Present Illness: Damon [name] is a 77 y.o. male who is being seen today for the evaluation of AF at the request of [name], MD. Presenting today for electrophysiology evaluation.  He has a history of hypertension, hyperlipidemia, sleep apnea, PAD, atrial fibrillation.  He is post ablation in 2014 with PVI, posterior wall isolation in 2020.  He presented to A-fib clinic 03/11/2022 after going into atrial fibrillation.  Cutting down trees at that time.  He had a cardioversion.  He has remained in sinus rhythm.  He did have evaluation for a possible convergent procedure, though he would like to proceed with endocardial ablation.  Today, denies symptoms of palpitations, chest pain, shortness of breath, orthopnea, PND, lower extremity edema, claudication, dizziness, presyncope, syncope, bleeding, or neurologic sequela. The patient is tolerating medications without difficulties.     Past Medical History:  Diagnosis Date   Bladder cancer ([place])    Hearing loss of both ears    History of colon polyps    BENIGN   Hyperlipidemia    Hypertension    Mitral regurgitation    OSA on CPAP    PAD (peripheral artery disease) ([facility])    ABI--  RIGHT SIDE NORMAL LEFT SIDE MODERATELY REDUCED W/ DISTAL LEFT SFA STENOSIS//  MILD CALDICATION   PAF (paroxysmal atrial fibrillation) ([place])     a. s/p PVI 2014; b. s/p redo PVI and CTI 01/2015   Vitamin D deficiency    Past Surgical History:  Procedure Laterality Date   ABLATION OF DYSRHYTHMIC FOCUS  01/26/2015   ATRIAL FIBRILLATION ABLATION N/A 07/27/2012   Procedure: ATRIAL FIBRILLATION ABLATION;  Surgeon: [name], MD;  Location: [place] CATH LAB;  Service: Cardiovascular;  Laterality: N/A;   ATRIAL  FIBRILLATION ABLATION N/A 08/25/2018   Procedure: ATRIAL FIBRILLATION ABLATION;  Surgeon: [name], MD;  Location: [place] CV LAB;  Service: Cardiovascular;  Laterality: N/A;   CARDIAC ELECTROPHYSIOLOGY STUDY AND ABLATION  07-27-2012  DR [name]   SUCCESSFUL ABLATION OF A-FIB   CARDIOVERSION  08/06/2012   Procedure: CARDIOVERSION;  Surgeon: [name], MD;  Location: [place];  Service: Cardiovascular;  Laterality: N/A;   CARDIOVERSION N/A 08/25/2014   Procedure: CARDIOVERSION;  Surgeon: Larey Dresser, MD;  Location: [place];  Service: Cardiovascular;  Laterality: N/A;   CARDIOVERSION N/A 11/13/2014   Procedure: CARDIOVERSION;  Surgeon: Sueanne Margarita, MD;  Location: [place] ENDOSCOPY;  Service: Cardiovascular;  Laterality: N/A;   CARDIOVERSION N/A 12/28/2014   Procedure: CARDIOVERSION;  Surgeon: [name], MD;  Location: [place];  Service: Cardiovascular;  Laterality: N/A;   CARDIOVERSION N/A 01/30/2017   Procedure: CARDIOVERSION;  Surgeon: Josue Hector, MD;  Location: [facility] ENDOSCOPY;  Service: Cardiovascular;  Laterality: N/A;   CARDIOVERSION N/A 04/06/2017   Procedure: CARDIOVERSION;  Surgeon: Dorothy Spark, MD;  Location: [facility] ENDOSCOPY;  Service: Cardiovascular;  Laterality: N/A;   CARDIOVERSION N/A 05/25/2018   Procedure: CARDIOVERSION;  Surgeon: Skeet Latch, MD;  Location: [facility] ENDOSCOPY;  Service: Cardiovascular;  Laterality: N/A;   CARDIOVERSION N/A 08/09/2018   Procedure: CARDIOVERSION;  Surgeon: Buford Dresser, MD;  Location: [facility] ENDOSCOPY;  Service: Cardiovascular;  Laterality: N/A;   CARDIOVERSION N/A 07/04/2019   Procedure: CARDIOVERSION;  Surgeon: Meda Coffee,  Jamse Belfast, MD;  Location: [facility] ENDOSCOPY;  Service: Cardiovascular;  Laterality: N/A;   CARDIOVERSION N/A 09/23/2019   Procedure: CARDIOVERSION;  Surgeon: Dorothy Spark, MD;  Location: [place];  Service: Cardiovascular;  Laterality: N/A;   CARDIOVERSION N/A 08/07/2021    Procedure: CARDIOVERSION;  Surgeon: Sueanne Margarita, MD;  Location: [facility] ENDOSCOPY;  Service: Cardiovascular;  Laterality: N/A;   CARDIOVERSION N/A 03/17/2022   Procedure: CARDIOVERSION;  Surgeon: Sueanne Margarita, MD;  Location: [facility] ENDOSCOPY;  Service: Cardiovascular;  Laterality: N/A;   CARDIOVERSION N/A 06/26/2022   Procedure: CARDIOVERSION;  Surgeon: Pixie Casino, MD;  Location: [facility] ENDOSCOPY;  Service: Cardiovascular;  Laterality: N/A;   CATARACT EXTRACTION W/ INTRAOCULAR LENS  IMPLANT, BILATERAL     COLONOSCOPY WITH PROPOFOL N/A 02/13/2015   Procedure: COLONOSCOPY WITH PROPOFOL;  Surgeon: Garlan Fair, MD;  Location: [facility] ENDOSCOPY;  Service: Endoscopy;  Laterality: N/A;   CYSTOSCOPY W/ RETROGRADES Bilateral 04/12/2013   Procedure: CYSTOSCOPY WITH RETROGRADE PYELOGRAM;  Surgeon: [name], MD;  Location: [facility];  Service: Urology;  Laterality: Bilateral;   CYSTOSCOPY WITH RETROGRADE PYELOGRAM, URETEROSCOPY AND STENT PLACEMENT  03/06/2021   Procedure: CYSTOSCOPY WITH BILATERAL RETROGRADE PYELOGRAM, LEFT URETEROSCOPY AND STENT PLACEMENT;  Surgeon: [name], MD;  Location: [facility];  Service: Urology;;  1 HR   ELECTROPHYSIOLOGIC STUDY N/A 01/26/2015   Procedure: Atrial Fibrillation Ablation;  Surgeon: [name], MD;  Location: [place] CV LAB;  Service: Cardiovascular;  Laterality: N/A;   HOLMIUM LASER APPLICATION Left 01/09/2978   Procedure: HOLMIUM LASER APPLICATION;  Surgeon: [name], MD;  Location: [facility];  Service: Urology;  Laterality: Left;   JOINT REPLACEMENT     LEFT HEART CATHETERIZATION WITH CORONARY ANGIOGRAM N/A 11/24/2013   Procedure: LEFT HEART CATHETERIZATION WITH CORONARY ANGIOGRAM;  Surgeon: [name], MD;  Location: [facility] CATH LAB;  Service: Cardiovascular;  Laterality: N/A;   PERCUTANEOUS CORONARY ROTOBLATOR INTERVENTION (PCI-R) N/A 11/28/2013   Procedure: PERCUTANEOUS CORONARY  ROTOBLATOR INTERVENTION (PCI-R);  Surgeon: [name], MD;  Location: [facility] CATH LAB;  Service: Cardiovascular;  Laterality: N/A;   PERCUTANEOUS CORONARY STENT INTERVENTION (PCI-S)  11/24/2013   Procedure: PERCUTANEOUS CORONARY STENT INTERVENTION (PCI-S);  Surgeon: [name], MD;  Location: [facility] CATH LAB;  Service: Cardiovascular;;   TEE WITHOUT CARDIOVERSION  07/26/2012   Procedure: TRANSESOPHAGEAL ECHOCARDIOGRAM (TEE);  Surgeon: [name], MD;  Location: [facility] ENDOSCOPY;  Service: Cardiovascular;  Laterality: N/A;   TEE WITHOUT CARDIOVERSION N/A 08/25/2014   Procedure: TRANSESOPHAGEAL ECHOCARDIOGRAM (TEE);  Surgeon: Larey Dresser, MD;  Location: [place];  Service: Cardiovascular;  Laterality: N/A;   TEE WITHOUT CARDIOVERSION  01/26/2015   Procedure: Transesophageal Echocardiogram (Tee);  Surgeon: [name], MD;  Location: [place] CV LAB;  Service: Cardiovascular;;   TEE WITHOUT CARDIOVERSION N/A 07/04/2019   Procedure: TRANSESOPHAGEAL ECHOCARDIOGRAM (TEE);  Surgeon: Dorothy Spark, MD;  Location: [facility] ENDOSCOPY;  Service: Cardiovascular;  Laterality: N/A;   TONSILLECTOMY  age 63   TOTAL HIP ARTHROPLASTY  07/25/2011   Procedure: TOTAL HIP ARTHROPLASTY ANTERIOR APPROACH;  Surgeon: Mcarthur Rossetti;  Location: [facility] ORS;  Service: Orthopedics;  Laterality: Right;   TOTAL HIP ARTHROPLASTY Left 02/08/2010   TRANSURETHRAL RESECTION OF BLADDER TUMOR WITH GYRUS (TURBT-GYRUS) N/A 04/12/2013   Procedure: TRANSURETHRAL RESECTION OF BLADDER TUMOR WITH GYRUS (TURBT-GYRUS);  Surgeon: [name], MD;  Location: [facility];  Service: Urology;  Laterality: N/A;     Current Outpatient Medications  Medication Sig Dispense Refill   bisoprolol-hydrochlorothiazide (ZIAC) 10-6.25 MG tablet Take  1 tablet  every Morning  for BP 90 tablet 3   Cholecalciferol (VITAMIN D3) 125 MCG (5000 UT) CAPS Take 10,000 units Daily 30 capsule    diltiazem (CARDIZEM CD)  180 MG 24 hr capsule TAKE 1 CAPSULE BY MOUTH DAILY 90 capsule 3   ezetimibe (ZETIA) 10 MG tablet Take  1 tablet  Daily  for Cholesterol                                                                         /                                      TAKE                         BY                               MOUTH 90 tablet 3   Multiple Vitamin (MULTIVITAMIN WITH MINERALS) TABS tablet Take 1 tablet Daily     nitroGLYCERIN (NITROSTAT) 0.4 MG SL tablet DISSOLVE ONE TABLET UNDER THE TONGUE EVERY 5 MINUTES AS NEEDED FOR CHEST PAIN.DO NOT EXCEED A TOTAL OF 3 DOSES IN 15 MINUTES 25 tablet 6   Polyvinyl Alcohol-Povidone (REFRESH OP) Place 1 drop into both eyes 3 (three) times daily as needed (dry/irritated eyes.).     rivaroxaban (XARELTO) 20 MG TABS tablet Take 1 tablet (20 mg total) by mouth daily with supper. 90 tablet 2   rosuvastatin (CRESTOR) 40 MG tablet TAKE ONE-HALF TABLET BY MOUTH  DAILY FOR CHOLESTEROL 45 tablet 3   esomeprazole (NEXIUM) 40 MG capsule 1 tab po QD x 14 28 capsule 1   sucralfate (CARAFATE) 1 g tablet 1 tab BID x 14 day 30 tablet 1   No current facility-administered medications for this visit.    Allergies:   Patient has no known allergies.   Social History:  The patient  reports that he quit smoking about 40 years ago. His smoking use included cigarettes. He has a 20.00 pack-year smoking history. He has never used smokeless tobacco. He reports that he does not drink alcohol and does not use drugs.   Family History:  The patient's family history includes Heart attack in his mother; Heart disease in his father and mother; Heart failure in his father.   ROS:  Please see the history of present illness.   Otherwise, review of systems is positive for none.   All other systems are reviewed and negative.   PHYSICAL EXAM: VS:  BP 114/68   Pulse 62   Ht '5\' 9"'$  (1.753 m)   Wt 205 lb (93 kg)   SpO2 91%   BMI 30.27 kg/m  , BMI Body mass index is 30.27 kg/m. GEN: Well nourished, well  developed, in no acute distress  HEENT: normal  Neck: no JVD, carotid bruits, or masses Cardiac: RRR; no murmurs, rubs, or gallops,no edema  Respiratory:  clear to auscultation bilaterally, normal work of  breathing GI: soft, nontender, nondistended, + BS MS: no deformity or atrophy  Skin: warm and dry Neuro:  Strength and sensation are intact Psych: euthymic mood, full affect  EKG:  EKG is not ordered today. Personal review of the ekg ordered 07/03/22 shows sinus rhythm   Recent Labs: 04/01/2022: TSH 1.00 07/03/2022: ALT 19; BUN 14; Creat 0.86; Hemoglobin 14.8; Magnesium 2.1; Platelets 182; Potassium 4.0; Sodium 144    Lipid Panel     Component Value Date/Time   CHOL 118 07/03/2022 0000   TRIG 87 07/03/2022 0000   HDL 61 07/03/2022 0000   CHOLHDL 1.9 07/03/2022 0000   VLDL 19 01/15/2017 0932   LDLCALC 40 07/03/2022 0000     Wt Readings from Last 3 Encounters:  07/24/22 205 lb (93 kg)  07/03/22 208 lb 6.4 oz (94.5 kg)  06/26/22 200 lb (90.7 kg)      Other studies Reviewed: Additional studies/ records that were reviewed today include: TEE 07/04/19  Review of the above records today demonstrates:   1. Left ventricular ejection fraction, by visual estimation, is 55 to  60%. The left ventricle has normal function. Normal left ventricular size.  Left ventricular septal wall thickness was normal. There is no left  ventricular hypertrophy.   2. Left ventricular diastolic function could not be evaluated.   3. The left ventricle has no regional wall motion abnormalities.   4. Global right ventricle has normal systolic function.The right  ventricular size is normal. No increase in right ventricular wall  thickness.   5. Left atrial size was normal.   6. No thrombus in the left atrium or left atrial appendage.   7. Right atrial size was normal.   8. The mitral valve is normal in structure. Mild mitral valve  regurgitation. No evidence of mitral stenosis.   9. The tricuspid  valve is normal in structure. Tricuspid valve  regurgitation is mild.  10. The aortic valve is normal in structure. Aortic valve regurgitation is  not visualized. No evidence of aortic valve sclerosis or stenosis.  11. The pulmonic valve was normal in structure. Pulmonic valve  regurgitation is not visualized.  12. The inferior vena cava is normal in size with greater than 50%  respiratory variability, suggesting right atrial pressure of 3 mmHg.  13. No intracardiac source of embolism. This study was followed by a  successful cardioversion.    ASSESSMENT AND PLAN:  1.  Persistent atrial fibrillation: Status post ablation in 2014 and again in 2020.  He has had more frequent episodes of atrial fibrillation.  Currently on Xarelto 20 mg daily.  CHA2DS2-VASc of 4.  He would like to avoid antiarrhythmic medications.  Due to that, we Jamelle Noy plan for ablation.  Risk and benefits have been discussed.  He understands the risks and is agreed to the procedure.  Risk, benefits, and alternatives to EP study and radiofrequency ablation for afib were also discussed in detail today. These risks include but are not limited to stroke, bleeding, vascular damage, tamponade, perforation, damage to the esophagus, lungs, and other structures, pulmonary vein stenosis, worsening renal function, and death. The patient understands these risk and wishes to proceed.  We Sosaia Pittinger therefore proceed with catheter ablation at the next available time.  Carto, ICE, anesthesia are requested for the procedure.  Tyffani Foglesong also obtain CT PV protocol prior to the procedure to exclude LAA thrombus and further evaluate atrial anatomy.   2.  Secondary hypercoagulable state: Currently on Xarelto for atrial fibrillation as above  3.  Hypertension: Currently well-controlled  4.  Obstructive sleep apnea: CPAP compliance encouraged  5.  Coronary artery disease: No current chest pain   Current medicines are reviewed at length with the patient today.    The patient does not have concerns regarding his medicines.  The following changes were made today:  none  Labs/ tests ordered today include:  No orders of the defined types were placed in this encounter.    Disposition:   FU 3 months  Signed, Jaiyden Laur Meredith Leeds, MD  07/24/2022 2:25 PM     [place] [address] [place] [address] [phone] (office) [phone] (fax)

## 2022-07-27 NOTE — Telephone Encounter (Signed)
   Name: Damon [name]  DOB: 23-Sep-1945  MRN: [id number]   Primary Cardiologist: Lauree Chandler, MD  Chart reviewed as part of pre-operative protocol coverage.   Damon [name] was last seen on 1/4/by Dr. Curt Bears, Patient at low to intermediate risk for a low risk procedure.  No further cardiac testing necessary.   Therefore, based on ACC/AHA guidelines, the patient would be at acceptable risk for the planned procedure without further cardiovascular testing.   Patient is past 3 weeks post cardioversion timeframe for holding anticoagulation.  Therefore he will be able to hold his Xarelto for 1- 2 days prior to procedure.   I will route this recommendation to the requesting party via Epic fax function and remove from pre-op pool. Please call with questions.  Mable Fill, Marissa Nestle, NP 07/27/2022, 11:30 AM

## 2022-07-31 ENCOUNTER — Ambulatory Visit: Payer: Medicare Other | Admitting: Cardiothoracic Surgery

## 2022-07-31 LAB — HM COLONOSCOPY

## 2022-08-07 ENCOUNTER — Other Ambulatory Visit: Payer: Self-pay

## 2022-08-07 DIAGNOSIS — I4819 Other persistent atrial fibrillation: Secondary | ICD-10-CM

## 2022-08-12 ENCOUNTER — Encounter: Payer: Self-pay | Admitting: Internal Medicine

## 2022-08-19 ENCOUNTER — Other Ambulatory Visit (HOSPITAL_COMMUNITY): Payer: Self-pay | Admitting: Nurse Practitioner

## 2022-08-28 ENCOUNTER — Encounter (HOSPITAL_COMMUNITY): Payer: Self-pay | Admitting: *Deleted

## 2022-09-04 ENCOUNTER — Ambulatory Visit: Payer: Medicare Other | Admitting: Cardiothoracic Surgery

## 2022-09-10 ENCOUNTER — Encounter: Payer: Medicare Other | Admitting: Internal Medicine

## 2022-09-18 ENCOUNTER — Ambulatory Visit: Payer: Medicare Other | Attending: Cardiology

## 2022-09-18 DIAGNOSIS — I4819 Other persistent atrial fibrillation: Secondary | ICD-10-CM

## 2022-09-19 LAB — CBC
Hematocrit: 42.5 % (ref 37.5–51.0)
Hemoglobin: 14.5 g/dL (ref 13.0–17.7)
MCH: 30.5 pg (ref 26.6–33.0)
MCHC: 34.1 g/dL (ref 31.5–35.7)
MCV: 90 fL (ref 79–97)
Platelets: 204 10*3/uL (ref 150–450)
RBC: 4.75 x10E6/uL (ref 4.14–5.80)
RDW: 12 % (ref 11.6–15.4)
WBC: 5.7 10*3/uL (ref 3.4–10.8)

## 2022-09-19 LAB — BASIC METABOLIC PANEL
BUN/Creatinine Ratio: 12 (ref 10–24)
BUN: 12 mg/dL (ref 8–27)
CO2: 23 mmol/L (ref 20–29)
Calcium: 9.2 mg/dL (ref 8.6–10.2)
Chloride: 106 mmol/L (ref 96–106)
Creatinine, Ser: 0.99 mg/dL (ref 0.76–1.27)
Glucose: 104 mg/dL — ABNORMAL HIGH (ref 70–99)
Potassium: 4.6 mmol/L (ref 3.5–5.2)
Sodium: 143 mmol/L (ref 134–144)
eGFR: 78 mL/min/{1.73_m2} (ref >59)

## 2022-09-22 ENCOUNTER — Other Ambulatory Visit: Payer: Medicare Other

## 2022-10-01 ENCOUNTER — Telehealth (HOSPITAL_COMMUNITY): Payer: Self-pay | Admitting: *Deleted

## 2022-10-01 NOTE — Telephone Encounter (Signed)
Reaching out to patient to offer assistance regarding upcoming cardiac imaging study; pt verbalizes understanding of appt date/time, parking situation and where to check in, pre-test NPO status and verified current allergies; name and call back number provided for further questions should they arise  Dedric Ethington RN Navigator Cardiac Imaging [facility] [phone] office [phone] cell  Patient to take his daily medications. 

## 2022-10-02 ENCOUNTER — Ambulatory Visit (HOSPITAL_BASED_OUTPATIENT_CLINIC_OR_DEPARTMENT_OTHER)
Admission: RE | Admit: 2022-10-02 | Discharge: 2022-10-02 | Disposition: A | Discharge status: Home / Self Care | Payer: Medicare Other | Source: Ambulatory Visit | Attending: Cardiology | Admitting: Cardiology

## 2022-10-02 DIAGNOSIS — I4819 Other persistent atrial fibrillation: Secondary | ICD-10-CM | POA: Diagnosis present

## 2022-10-02 MED ORDER — IOHEXOL 350 MG/ML SOLN
100.0000 mL | Freq: Once | INTRAVENOUS | Status: AC | PRN
  Administered 2022-10-02: 80 mL via INTRAVENOUS

## 2022-10-06 ENCOUNTER — Encounter: Payer: Self-pay | Admitting: Internal Medicine

## 2022-10-06 NOTE — Progress Notes (Signed)
Annual  Screening/Preventative Visit  & Comprehensive Evaluation & Examination   Future Appointments  Date Time Provider Department  10/07/2022  3:00 PM [name], MD [facility]  11/06/2022 11:30 AM Oliver Barre, PA [facility]  01/09/2023  8:45 AM Constance Haw, MD [facility]  01/13/2023  2:30 PM Darrol Jump, NP [facility]  10/12/2023  3:00 PM [name], MD [facility]              This very nice 77 y.o.   MWM presents for a Screening /Preventative Visit & comprehensive evaluation and management of multiple medical co-morbidities.  Patient has been followed for HTN, HLD, Prediabetes and Vitamin D Deficiency.        HTN predates since  1996. Patient's BP has been controlled at home.  Today's BP is at goal -  .  In 2015, patient had PCA/Stenting  & Rotator ablation.  Patient has long standing hx/o pAfib s/p multiple  ablations ( s/p 8 CV & 3 ablations ). Patient is CHADsVasc 3 and is on Xarelto, but was switched to Eliquis for better insurance coverage last year .   Patient denies any cardiac symptoms as chest pain, palpitations, shortness of breath, dizziness or ankle swelling.        Patient's hyperlipidemia is controlled with diet and Rosuvastatin. Patient denies myalgias or other medication SE's. Last lipids were at goal :  Lab Results  Component Value Date   CHOL 118 07/03/2022   HDL 61 07/03/2022   LDLCALC 40 07/03/2022   TRIG 87 07/03/2022   CHOLHDL 1.9 07/03/2022         Patient has hx/o prediabetes (A1c 5.7% /2011 & 5.9% /2013) and patient denies reactive hypoglycemic symptoms, visual blurring, diabetic polys or paresthesias. Last A1c was at goal :   Lab Results  Component Value Date   HGBA1C 5.4 04/01/2022         Finally, patient has history of Vitamin D Deficiency ("27" /2008) and last vitamin D was at goal :   Lab Results  Component Value Date   VD25OH 97 04/01/2022        B                   esomeprazole (NEXIUM) 40 MG  capsule 1 tab po QD x 14   ezetimibe (ZETIA) 10 MG tablet Take  1 tablet  Daily     Multiple Vitamin Take 1 tablet Daily   nitroGLYCERIN (NITROSTAT) 0.4 MG SL tablet AS NEEDED FOR CHEST PAIN   Polyvinyl Alcohol-Povidone (REFRESH OP) 1 drop, Both Eyes, 3 times daily PRN   rosuvastatin (CRESTOR) 40 MG tablet TAKE ONE-HALF TABLET BY MOUTH  DAILY FOR CHOLESTEROL   sucralfate (CARAFATE) 1 g tablet 1 tab BID x 14 day   Xarelto 20 mg, Oral, Daily with supper    No Known Allergies   Past Medical History:  Diagnosis Date   Bladder cancer ([place])    Hearing loss of both ears    History of colon polyps    BENIGN   Hyperlipidemia    Hypertension    Mitral regurgitation    OSA on CPAP    PAD (peripheral artery disease) ([facility])    ABI--  RIGHT SIDE NORMAL LEFT SIDE MODERATELY REDUCED W/ DISTAL LEFT SFA STENOSIS//  MILD CLAUDICATION   PAF (paroxysmal atrial fibrillation) ([place])     a. s/p PVI 2014; b. s/p redo PVI and CTI 01/2015   Vitamin D deficiency  Health Maintenance  Topic Date Due   Zoster Vaccines- Shingrix (1 of 2) Never done   COVID-19 Vaccine (3 - Pfizer risk series) 10/13/2019   COLONOSCOPY  06/23/2022   TETANUS/TDAP  09/23/2023   Pneumonia Vaccine 14+ Years old  Completed   INFLUENZA VACCINE  Completed   Hepatitis C Screening  Completed   HPV VACCINES  Aged Out     Immunization History  Administered Date(s) Administered   DT (Pediatric) 07/22/2003, 12/28/2013   Influenza Split 04/14/2019   Influenza Whole 05/19/2013   Influenza, High Dose   0/28/2019, 04/14/2019, 05/28/2021   Influenza,inj,Quad   05/09/2014   Influenza  05/14/2015, 05/01/2016, 05/17/2018   PFIZER  SARS-COV-2 Vacc 08/18/2019, 09/15/2019   Pneumococcal  -13 07/11/2014   Pneumococcal  -23 12/04/2009, 06/10/2016   Tdap 09/22/2013     Colon -  04/22/2016 and 06/2019 - Dr Rosalie Gums @[facility]    Last Colon  07/31/2022 -  Dr Rosalie Gums @[facility]  - Recc repeat in 3 years - due Jan 2027    Past Surgical  History:  Procedure Laterality Date   ABLATION OF DYSRHYTHMIC FOCUS  01/26/2015   ATRIAL FIBRILLATION ABLATION N/A 07/27/2012   Procedure: ATRIAL FIBRILLATION ABLATION;  [name], MD   ATRIAL FIBRILLATION ABLATION N/A 08/25/2018   ATRIAL FIBRILLATION ABLATION;  [name], MD   CARDIAC ELECTROPHYSIOLOGY STUDY AND ABLATION  07-27-2012  DR [name]   SUCCESSFUL ABLATION OF A-FIB   CARDIOVERSION  08/06/2012   CARDIOVERSION;   [name], MD   CARDIOVERSION N/A 08/25/2014   CARDIOVERSION;Dalton Claris Gladden, MD   CARDIOVERSION N/A 11/13/2014   PCARDIOVERSION; Sueanne Margarita, MD   CARDIOVERSION N/A 12/28/2014   CARDIOVERSION;  Surgeon: [name], MD   CARDIOVERSION N/A 01/30/2017   CARDIOVERSION;  Josue Hector, MD;   CARDIOVERSION N/A 04/06/2017   CARDIOVERSION; Dorothy Spark, MD   CARDIOVERSION N/A 05/25/2018   CARDIOVERSION;  Surgeon: Skeet Latch, MD;  Location: [facility] ENDOSCOPY;  Service: Cardiovascular;  Laterality: N/A;   CARDIOVERSION N/A 08/09/2018   CARDIOVERSION;  Surgeon: Buford Dresser, MD;  Location: [facility] ENDOSCOPY;     CARDIOVERSION N/A 07/04/2019   CARDIOVERSION; Dorothy Spark, MD   CARDIOVERSION N/A 09/23/2019   CARDIOVERSION; Dorothy Spark, MD   CARDIOVERSION N/A 08/07/2021   Procedure: CARDIOVERSION;  Surgeon: Sueanne Margarita, MD;  Location: [facility] ENDOSCOPY;  Service: Cardiovascular;  Laterality: N/A;   CATARACT EXTRACTION W/ INTRAOCULAR LENS  IMPLANT, BILATERAL     COLONOSCOPY WITH PROPOFOL N/A 02/13/2015   COLONOSCOPY WITH PROPOFOL;  Garlan Fair, MD   CYSTOSCOPY W/ RETROGRADES  04/12/2013   CYSTOSCOPY WITH RETROGRADE PYELOGRAM;   [name], MD   CYSTOSCOPY WITH RETROGRADE PYELOGRAM, URETEROSCOPY AND STENT PLACEMENT  03/06/2021   CYSTOSCOPY WITH BILATERAL RETROGRADE PYELOGRAM, LEFT URETEROSCOPY AND STENT PLACEMENT;  [name], MD   ELECTROPHYSIOLOGIC STUDY N/A 01/26/2015   Atrial Fibrillation Ablation;   [name], MD    HOLMIUM LASER APPLICATION Left [id number]   Procedure: HOLMIUM LASER APPLICATION;  Surgeon: [name], MD;  Location: [facility];  Service: Urology;  Laterality: Left;   JOINT REPLACEMENT     LEFT HEART CATHETERIZATION WITH CORONARY ANGIOGRAM N/A 11/24/2013   LEFT HEART CATHETERIZATION WITH CORONARY ANGIOGRAM;  [name], MD   PERCUTANEOUS CORONARY ROTOBLATOR INTERVENTION (PCI-R) N/A 11/28/2013   PERCUTANEOUS CORONARY ROTOBLATOR INTERVENTION (PCI-R);[name], MD   PERCUTANEOUS CORONARY STENT INTERVENTION (PCI-S)  11/24/2013   PERCUTANEOUS CORONARY STENT INTERVENTION (PCI-S);  Annita Brod  Angelena Form, MD;   TEE WITHOUT CARDIOVERSION  07/26/2012   \TRANSESOPHAGEAL ECHOCARDIOGRAM (TEE);  Surgeon: [name], MD;  Location: [facility] ENDOSCOPY;  Service: Cardiovascular;  Laterality: N/A;   TEE WITHOUT CARDIOVERSION N/A 08/25/2014   TRANSESOPHAGEAL ECHOCARDIOGRAM (TEE);  Larey Dresser, [place] ENDOSCOPY   TEE WITHOUT CARDIOVERSION  01/26/2015   Procedure: Transesophageal Echocardiogram (Tee); [name], MD   TEE WITHOUT CARDIOVERSION N/A 07/04/2019   TRANSESOPHAGEAL ECHOCARDIOGRAM (TEE);  Dorothy Spark, MD   TONSILLECTOMY  age 67   TOTAL HIP ARTHROPLASTY  07/25/2011   Procedure: TOTAL HIP ARTHROPLASTY ANTERIOR APPROACH,Right;  Mcarthur Rossetti   TOTAL HIP ARTHROPLASTY Left 02/08/2010   TRANSURETHRAL RESECTION OF BLADDER TUMOR WITH GYRUS (TURBT-GYRUS) N/A 04/12/2013   TRANSURETHRAL RESECTION OF BLADDER TUMOR WITH GYRUS (TURBT-GYRUS);  [name], MD     Family History  Problem Relation Age of Onset   Heart attack Mother    Heart disease Mother    Heart failure Father    Heart disease Father      Social History   Tobacco Use   Smoking status: Former    Packs/day: 2.00    Years: 10.00    Pack years: 20.00    Types: Cigarettes    Quit date: 09/20/1981    Years since quitting: 39.9   Smokeless tobacco: Never  Vaping  Use   Vaping Use: Never used  Substance Use Topics   Alcohol use: No    Comment: pt states he has stopped drinking alcohol   Drug use: No      ROS Constitutional: Denies fever, chills, weight loss/gain, headaches, insomnia,  night sweats or change in appetite. Does c/o fatigue. Eyes: Denies redness, blurred vision, diplopia, discharge, itchy or watery eyes.  ENT: Denies discharge, congestion, post nasal drip, epistaxis, sore throat, earache, hearing loss, dental pain, Tinnitus, Vertigo, Sinus pain or snoring.  Cardio: Denies chest pain, palpitations, irregular heartbeat, syncope, dyspnea, diaphoresis, orthopnea, PND, claudication or edema Respiratory: denies cough, dyspnea, DOE, pleurisy, hoarseness, laryngitis or wheezing.  Gastrointestinal: Denies dysphagia, heartburn, reflux, water brash, pain, cramps, nausea, vomiting, bloating, diarrhea, constipation, hematemesis, melena, hematochezia, jaundice or hemorrhoids Genitourinary: Denies dysuria, frequency, urgency, nocturia, hesitancy, discharge, hematuria or flank pain Musculoskeletal: Denies arthralgia, myalgia, stiffness, Jt. Swelling, pain, limp or strain/sprain. Denies Falls. Skin: Denies puritis, rash, hives, warts, acne, eczema or change in skin lesion Neuro: No weakness, tremor, incoordination, spasms, paresthesia or pain Psychiatric: Denies confusion, memory loss or sensory loss. Denies Depression. Endocrine: Denies change in weight, skin, hair change, nocturia, and paresthesia, diabetic polys, visual blurring or hyper / hypo glycemic episodes.  Heme/Lymph: No excessive bleeding, bruising or enlarged lymph nodes.   Physical Exam  There were no vitals taken for this visit.  General Appearance: Well nourished and well groomed and in no apparent distress.  Eyes: PERRLA, EOMs, conjunctiva no swelling or erythema, normal fundi and vessels. Sinuses: No frontal/maxillary tenderness ENT/Mouth: EACs patent / TMs  nl. Nares clear  without erythema, swelling, mucoid exudates. Oral hygiene is good. No erythema, swelling, or exudate. Tongue normal, non-obstructing. Tonsils not swollen or erythematous. Hearing normal.  Neck: Supple, thyroid not palpable. No bruits, nodes or JVD. Respiratory: Respiratory effort normal.  BS equal and clear bilateral without rales, rhonci, wheezing or stridor. Cardio: Heart sounds are normal with regular rate and rhythm and no murmurs, rubs or gallops. Peripheral pulses are normal and equal bilaterally without edema. No aortic or femoral bruits. Chest: symmetric with normal excursions and percussion.  Abdomen:  Soft, with Nl bowel sounds. Nontender, no guarding, rebound, hernias, masses, or organomegaly.  Lymphatics: Non tender without lymphadenopathy.  Musculoskeletal: Full ROM all peripheral extremities, joint stability, 5/5 strength, and normal gait. Skin: Warm and dry without rashes, lesions, cyanosis, clubbing or  ecchymosis.  Neuro: Cranial nerves intact, reflexes equal bilaterally. Normal muscle tone, no cerebellar symptoms. Sensation intact.  Pysch: Alert and oriented X 3 with normal affect, insight and judgment appropriate.   Assessment and Plan  1. Annual Preventative/Screening Exam    2. Essential hypertension  - EKG 12-Lead - [place], RETROPERITNL ABD,  LTD - Urinalysis, Routine w reflex microscopic - Microalbumin / creatinine urine ratio - CBC with Differential/Platelet - COMPLETE METABOLIC PANEL WITH GFR - Magnesium - TSH  3. Hyperlipidemia, mixed  - EKG 12-Lead - [place], RETROPERITNL ABD,  LTD - Lipid panel - TSH  4. Abnormal glucose  - EKG 12-Lead - [place], RETROPERITNL ABD,  LTD - Hemoglobin A1c - Insulin, random  5. Vitamin D deficiency  - VITAMIN D 25 Hydroxy   6. Paroxysmal atrial fibrillation (HCC)  - EKG 12-Lead - TSH  7. Atherosclerosis of native coronary artery without angina pectoris  - EKG 12-Lead - [place], RETROPERITNL ABD,  LTD  8. Aortic  atherosclerosis ([place]) by Abd CT Scan in 2015  - EKG 12-Lead - [place], RETROPERITNL ABD,  LTD  9. OSA on CPAP   10. BPH with obstruction/lower urinary tract symptoms  - PSA  11. Screening for colorectal cancer  - POC Hemoccult Bld/Stl (3-Cd Home Screen); Future  12. Prostate cancer screening  - PSA  13. Screening for heart disease  - EKG 12-Lead  14. FHx: heart disease  - EKG 12-Lead - [place], RETROPERITNL ABD,  LTD  15. Former smoker  - EKG 12-Lead - [place], RETROPERITNL ABD,  LTD  16. Screening for AAA (aortic abdominal aneurysm)  - [place], RETROPERITNL ABD,  LTD  17. Medication management  - Magnesium - Lipid panel - TSH - Hemoglobin A1c - Insulin, random - VITAMIN D 25 Hydroxy          Patient was counseled in prudent diet, weight control to achieve/maintain BMI less than 25, BP monitoring, regular exercise and medications as discussed.  Discussed med effects and SE's. Routine screening labs and tests as requested with regular follow-up as recommended. Over 40 minutes of exam, counseling, chart review and high complex critical decision making was performed   Kirtland Bouchard, MD

## 2022-10-06 NOTE — Patient Instructions (Signed)

## 2022-10-07 ENCOUNTER — Ambulatory Visit (INDEPENDENT_AMBULATORY_CARE_PROVIDER_SITE_OTHER): Payer: Medicare Other | Admitting: Internal Medicine

## 2022-10-07 ENCOUNTER — Encounter: Payer: Self-pay | Admitting: Internal Medicine

## 2022-10-07 VITALS — BP 138/88 | HR 57 | Temp 97.9°F | Resp 16 | Ht 69.0 in | Wt 202.0 lb

## 2022-10-07 DIAGNOSIS — E782 Mixed hyperlipidemia: Secondary | ICD-10-CM

## 2022-10-07 DIAGNOSIS — N138 Other obstructive and reflux uropathy: Secondary | ICD-10-CM

## 2022-10-07 DIAGNOSIS — R7309 Other abnormal glucose: Secondary | ICD-10-CM

## 2022-10-07 DIAGNOSIS — I10 Essential (primary) hypertension: Secondary | ICD-10-CM

## 2022-10-07 DIAGNOSIS — Z87891 Personal history of nicotine dependence: Secondary | ICD-10-CM

## 2022-10-07 DIAGNOSIS — I700 Atherosclerosis of aorta: Secondary | ICD-10-CM | POA: Diagnosis not present

## 2022-10-07 DIAGNOSIS — Z0001 Encounter for general adult medical examination with abnormal findings: Secondary | ICD-10-CM

## 2022-10-07 DIAGNOSIS — I2510 Atherosclerotic heart disease of native coronary artery without angina pectoris: Secondary | ICD-10-CM

## 2022-10-07 DIAGNOSIS — Z125 Encounter for screening for malignant neoplasm of prostate: Secondary | ICD-10-CM

## 2022-10-07 DIAGNOSIS — G4733 Obstructive sleep apnea (adult) (pediatric): Secondary | ICD-10-CM

## 2022-10-07 DIAGNOSIS — Z0000 Encounter for general adult medical examination without abnormal findings: Secondary | ICD-10-CM

## 2022-10-07 DIAGNOSIS — I480 Paroxysmal atrial fibrillation: Secondary | ICD-10-CM

## 2022-10-07 DIAGNOSIS — R9389 Abnormal findings on diagnostic imaging of other specified body structures: Secondary | ICD-10-CM

## 2022-10-07 DIAGNOSIS — Z8249 Family history of ischemic heart disease and other diseases of the circulatory system: Secondary | ICD-10-CM

## 2022-10-07 DIAGNOSIS — Z136 Encounter for screening for cardiovascular disorders: Secondary | ICD-10-CM

## 2022-10-07 DIAGNOSIS — Z1211 Encounter for screening for malignant neoplasm of colon: Secondary | ICD-10-CM

## 2022-10-07 DIAGNOSIS — E559 Vitamin D deficiency, unspecified: Secondary | ICD-10-CM

## 2022-10-07 DIAGNOSIS — Z79899 Other long term (current) drug therapy: Secondary | ICD-10-CM

## 2022-10-08 LAB — CBC WITH DIFFERENTIAL/PLATELET
Absolute Monocytes: 571 cells/uL (ref 200–950)
Basophils Absolute: 61 cells/uL (ref 0–200)
Basophils Relative: 0.9 %
Eosinophils Absolute: 170 cells/uL (ref 15–500)
Eosinophils Relative: 2.5 %
HCT: 44 % (ref 38.5–50.0)
Hemoglobin: 15.1 g/dL (ref 13.2–17.1)
Lymphs Abs: 1571 cells/uL (ref 850–3900)
MCH: 30.3 pg (ref 27.0–33.0)
MCHC: 34.3 g/dL (ref 32.0–36.0)
MCV: 88.4 fL (ref 80.0–100.0)
MPV: 10 fL (ref 7.5–12.5)
Monocytes Relative: 8.4 %
Neutro Abs: 4427 cells/uL (ref 1500–7800)
Neutrophils Relative %: 65.1 %
Platelets: 208 10*3/uL (ref 140–400)
RBC: 4.98 10*6/uL (ref 4.20–5.80)
RDW: 12 % (ref 11.0–15.0)
Total Lymphocyte: 23.1 %
WBC: 6.8 10*3/uL (ref 3.8–10.8)

## 2022-10-08 LAB — LIPID PANEL
Cholesterol: 132 mg/dL (ref <200)
HDL: 61 mg/dL (ref >=40)
LDL Cholesterol (Calc): 55 mg/dL (calc)
Non-HDL Cholesterol (Calc): 71 mg/dL (calc) (ref <130)
Total CHOL/HDL Ratio: 2.2 (calc) (ref <5.0)
Triglycerides: 75 mg/dL (ref <150)

## 2022-10-08 LAB — COMPLETE METABOLIC PANEL WITH GFR
AG Ratio: 1.9 (calc) (ref 1.0–2.5)
ALT: 18 U/L (ref 9–46)
AST: 17 U/L (ref 10–35)
Albumin: 4.4 g/dL (ref 3.6–5.1)
Alkaline phosphatase (APISO): 53 U/L (ref 35–144)
BUN: 13 mg/dL (ref 7–25)
CO2: 23 mmol/L (ref 20–32)
Calcium: 9.6 mg/dL (ref 8.6–10.3)
Chloride: 107 mmol/L (ref 98–110)
Creat: 0.91 mg/dL (ref 0.70–1.28)
Globulin: 2.3 g/dL (calc) (ref 1.9–3.7)
Glucose, Bld: 84 mg/dL (ref 65–99)
Potassium: 4.1 mmol/L (ref 3.5–5.3)
Sodium: 141 mmol/L (ref 135–146)
Total Bilirubin: 2.2 mg/dL — ABNORMAL HIGH (ref 0.2–1.2)
Total Protein: 6.7 g/dL (ref 6.1–8.1)
eGFR: 87 mL/min/{1.73_m2} (ref >=60)

## 2022-10-08 LAB — MAGNESIUM: Magnesium: 2.1 mg/dL (ref 1.5–2.5)

## 2022-10-08 LAB — HEMOGLOBIN A1C
Hgb A1c MFr Bld: 5.4 % of total Hgb (ref <5.7)
Mean Plasma Glucose: 108 mg/dL
eAG (mmol/L): 6 mmol/L

## 2022-10-08 LAB — URINALYSIS, ROUTINE W REFLEX MICROSCOPIC
Bilirubin Urine: NEGATIVE
Glucose, UA: NEGATIVE
Hgb urine dipstick: NEGATIVE
Ketones, ur: NEGATIVE
Leukocytes,Ua: NEGATIVE
Nitrite: NEGATIVE
Protein, ur: NEGATIVE
Specific Gravity, Urine: 1.008 (ref 1.001–1.035)
pH: 7 (ref 5.0–8.0)

## 2022-10-08 LAB — TSH: TSH: 0.86 mIU/L (ref 0.40–4.50)

## 2022-10-08 LAB — INSULIN, RANDOM: Insulin: 7.3 u[IU]/mL

## 2022-10-08 LAB — MICROALBUMIN / CREATININE URINE RATIO
Creatinine, Urine: 52 mg/dL (ref 20–320)
Microalb Creat Ratio: 8 mg/g creat (ref <30)
Microalb, Ur: 0.4 mg/dL

## 2022-10-08 LAB — PSA: PSA: 0.56 ng/mL (ref <=4.00)

## 2022-10-08 LAB — VITAMIN D 25 HYDROXY (VIT D DEFICIENCY, FRACTURES): Vit D, 25-Hydroxy: 101 ng/mL — ABNORMAL HIGH (ref 30–100)

## 2022-10-08 NOTE — Progress Notes (Signed)
<><><><><><><><><><><><><><><><><><><><><><><><><><><><><><><><><> <><><><><><><><><><><><><><><><><><><><><><><><><><><><><><><><><> -   Test results slightly outside the reference range are not unusual. If there is anything important, I will review this with you,  otherwise it is considered normal test values.   If you have further questions,  please do not hesitate to contact me at the office or via My Chart.  <><><><><><><><><><><><><><><><><><><><><><><><><><><><><><><><><> <><><><><><><><><><><><><><><><><><><><><><><><><><><><><><><><><>  -  Vitamin D = 101 - Excellent - Please keep dosing Same  <><><><><><><><><><><><><><><><><><><><><><><><><><><><><><><><><>  -  PSA - Very Low - No Prostate Cancer- Great  ! <><><><><><><><><><><><><><><><><><><><><><><><><><><><><><><><><>  -  A1c -Normal - No Diabetes  -  Great !  <><><><><><><><><><><><><><><><><><><><><><><><><><><><><><><><><>  - All Else - CBC - Kidneys - Electrolytes - Liver - Magnesium & Thyroid    - all  Normal / OK <><><><><><><><><><><><><><><><><><><><><><><><><><><><><><><><><> <><><><><><><><><><><><><><><><><><><><><><><><><><><><><><><><><>  -  Keep up the Great Work !   <><><><><><><><><><><><><><><><><><><><><><><><><><><><><><><><><> <><><><><><><><><><><><><><><><><><><><><><><><><><><><><><><><><>

## 2022-10-08 NOTE — Pre-Procedure Instructions (Signed)
Attempted to call patient regarding procedure instructions.  Left voicemail Instructed patient on the following items: Arrival time 0515 Nothing to eat or drink after midnight No meds AM of procedure Responsible person to drive you home and stay with you for 24 hrs  Have you missed any doses of anti-coagulant Xarelto- if you have missed any doses please let the office know right away.  Take dose today.

## 2022-10-09 ENCOUNTER — Encounter (HOSPITAL_COMMUNITY)
Admission: RE | Disposition: A | Discharge status: Home / Self Care | Payer: Self-pay | Source: Home / Self Care | Attending: Cardiology

## 2022-10-09 ENCOUNTER — Ambulatory Visit (HOSPITAL_BASED_OUTPATIENT_CLINIC_OR_DEPARTMENT_OTHER): Payer: Medicare Other | Admitting: Anesthesiology

## 2022-10-09 ENCOUNTER — Encounter (HOSPITAL_COMMUNITY): Payer: Self-pay | Admitting: Cardiology

## 2022-10-09 ENCOUNTER — Ambulatory Visit (HOSPITAL_COMMUNITY)
Admission: RE | Admit: 2022-10-09 | Discharge: 2022-10-09 | Disposition: A | Discharge status: Home / Self Care | Payer: Medicare Other | Attending: Cardiology | Admitting: Cardiology

## 2022-10-09 ENCOUNTER — Ambulatory Visit (HOSPITAL_COMMUNITY): Payer: Medicare Other | Admitting: Anesthesiology

## 2022-10-09 DIAGNOSIS — I10 Essential (primary) hypertension: Secondary | ICD-10-CM | POA: Insufficient documentation

## 2022-10-09 DIAGNOSIS — I4819 Other persistent atrial fibrillation: Secondary | ICD-10-CM | POA: Insufficient documentation

## 2022-10-09 DIAGNOSIS — Z87891 Personal history of nicotine dependence: Secondary | ICD-10-CM | POA: Insufficient documentation

## 2022-10-09 DIAGNOSIS — E785 Hyperlipidemia, unspecified: Secondary | ICD-10-CM | POA: Diagnosis not present

## 2022-10-09 DIAGNOSIS — G4733 Obstructive sleep apnea (adult) (pediatric): Secondary | ICD-10-CM | POA: Diagnosis not present

## 2022-10-09 DIAGNOSIS — I4891 Unspecified atrial fibrillation: Secondary | ICD-10-CM | POA: Diagnosis not present

## 2022-10-09 DIAGNOSIS — I739 Peripheral vascular disease, unspecified: Secondary | ICD-10-CM | POA: Insufficient documentation

## 2022-10-09 HISTORY — PX: ATRIAL FIBRILLATION ABLATION: EP1191

## 2022-10-09 LAB — POCT ACTIVATED CLOTTING TIME: Activated Clotting Time: 325 seconds

## 2022-10-09 SURGERY — ATRIAL FIBRILLATION ABLATION
Anesthesia: General

## 2022-10-09 MED ORDER — FENTANYL CITRATE (PF) 100 MCG/2ML IJ SOLN: 25.0000 ug | INTRAMUSCULAR | Status: DC | PRN

## 2022-10-09 MED ORDER — DOBUTAMINE INFUSION FOR EP/ECHO/NUC (1000 MCG/ML)
INTRAVENOUS | Status: AC
  Filled 2022-10-09: qty 250

## 2022-10-09 MED ORDER — AMISULPRIDE (ANTIEMETIC) 5 MG/2ML IV SOLN: 10.0000 mg | Freq: Once | INTRAVENOUS | Status: DC | PRN

## 2022-10-09 MED ORDER — HEPARIN (PORCINE) IN NACL 1000-0.9 UT/500ML-% IV SOLN
INTRAVENOUS | Status: DC | PRN
  Administered 2022-10-09 (×4): 500 mL

## 2022-10-09 MED ORDER — PROTAMINE SULFATE 10 MG/ML IV SOLN
INTRAVENOUS | Status: DC | PRN
  Administered 2022-10-09: 20 mg via INTRAVENOUS
  Administered 2022-10-09 (×2): 10 mg via INTRAVENOUS

## 2022-10-09 MED ORDER — SODIUM CHLORIDE 0.9% FLUSH: 3.0000 mL | Freq: Two times a day (BID) | INTRAVENOUS | Status: DC

## 2022-10-09 MED ORDER — ONDANSETRON HCL 4 MG/2ML IJ SOLN: 4.0000 mg | Freq: Four times a day (QID) | INTRAMUSCULAR | Status: DC | PRN

## 2022-10-09 MED ORDER — LIDOCAINE 2% (20 MG/ML) 5 ML SYRINGE
INTRAMUSCULAR | Status: DC | PRN
  Administered 2022-10-09: 40 mg via INTRAVENOUS

## 2022-10-09 MED ORDER — SODIUM CHLORIDE 0.9 % IV SOLN: 250.0000 mL | INTRAVENOUS | Status: DC | PRN

## 2022-10-09 MED ORDER — PHENYLEPHRINE HCL-NACL 20-0.9 MG/250ML-% IV SOLN
INTRAVENOUS | Status: DC | PRN
  Administered 2022-10-09: 25 ug/min via INTRAVENOUS

## 2022-10-09 MED ORDER — FENTANYL CITRATE (PF) 250 MCG/5ML IJ SOLN
INTRAMUSCULAR | Status: DC | PRN
  Administered 2022-10-09: 50 ug via INTRAVENOUS

## 2022-10-09 MED ORDER — ROCURONIUM BROMIDE 10 MG/ML (PF) SYRINGE
PREFILLED_SYRINGE | INTRAVENOUS | Status: DC | PRN
  Administered 2022-10-09: 60 mg via INTRAVENOUS

## 2022-10-09 MED ORDER — SODIUM CHLORIDE 0.9 % IV SOLN: INTRAVENOUS | Status: DC

## 2022-10-09 MED ORDER — DEXAMETHASONE SODIUM PHOSPHATE 10 MG/ML IJ SOLN
INTRAMUSCULAR | Status: DC | PRN
  Administered 2022-10-09: 10 mg via INTRAVENOUS

## 2022-10-09 MED ORDER — HEPARIN SODIUM (PORCINE) 1000 UNIT/ML IJ SOLN
INTRAMUSCULAR | Status: DC | PRN
  Administered 2022-10-09: 2000 [IU] via INTRAVENOUS
  Administered 2022-10-09: 15000 [IU] via INTRAVENOUS

## 2022-10-09 MED ORDER — HEPARIN SODIUM (PORCINE) 1000 UNIT/ML IJ SOLN
INTRAMUSCULAR | Status: DC | PRN
  Administered 2022-10-09: 1000 [IU] via INTRAVENOUS

## 2022-10-09 MED ORDER — ACETAMINOPHEN 500 MG PO TABS: 1000.0000 mg | ORAL_TABLET | Freq: Once | ORAL | Status: DC

## 2022-10-09 MED ORDER — PROPOFOL 10 MG/ML IV BOLUS
INTRAVENOUS | Status: DC | PRN
  Administered 2022-10-09: 170 mg via INTRAVENOUS

## 2022-10-09 MED ORDER — HEPARIN SODIUM (PORCINE) 1000 UNIT/ML IJ SOLN
INTRAMUSCULAR | Status: AC
  Filled 2022-10-09: qty 10

## 2022-10-09 MED ORDER — DOBUTAMINE INFUSION FOR EP/ECHO/NUC (1000 MCG/ML)
INTRAVENOUS | Status: DC | PRN
  Administered 2022-10-09: 20 ug/kg/min via INTRAVENOUS

## 2022-10-09 MED ORDER — ACETAMINOPHEN 325 MG PO TABS: 650.0000 mg | ORAL_TABLET | ORAL | Status: DC | PRN

## 2022-10-09 MED ORDER — ONDANSETRON HCL 4 MG/2ML IJ SOLN
INTRAMUSCULAR | Status: DC | PRN
  Administered 2022-10-09: 4 mg via INTRAVENOUS

## 2022-10-09 MED ORDER — SUGAMMADEX SODIUM 200 MG/2ML IV SOLN
INTRAVENOUS | Status: DC | PRN
  Administered 2022-10-09: 200 mg via INTRAVENOUS

## 2022-10-09 MED ORDER — SODIUM CHLORIDE 0.9% FLUSH: 3.0000 mL | INTRAVENOUS | Status: DC | PRN

## 2022-10-09 SURGICAL SUPPLY — 19 items
BAG SNAP BAND KOVER 36X36 (MISCELLANEOUS) IMPLANT
CATH ABLAT QDOT MICRO BI TC DF (CATHETERS) IMPLANT
CATH OCTARAY 2.0 F 3-3-3-3-3 (CATHETERS) IMPLANT
CATH PIGTAIL STEERABLE D1 8.7 (WIRE) IMPLANT
CATH S-M CIRCA TEMP PROBE (CATHETERS) IMPLANT
CATH SOUNDSTAR ECO 8FR (CATHETERS) IMPLANT
CATH WEB BI DIR CSDF CRV REPRO (CATHETERS) IMPLANT
CLOSURE PERCLOSE PROSTYLE (VASCULAR PRODUCTS) IMPLANT
COVER SWIFTLINK CONNECTOR (BAG) ×1 IMPLANT
PACK EP LATEX FREE (CUSTOM PROCEDURE TRAY) ×1
PACK EP LF (CUSTOM PROCEDURE TRAY) ×1 IMPLANT
PAD DEFIB RADIO PHYSIO CONN (PAD) ×1 IMPLANT
PATCH CARTO3 (PAD) IMPLANT
SHEATH CARTO VIZIGO SM CVD (SHEATH) IMPLANT
SHEATH PINNACLE 7F 10CM (SHEATH) IMPLANT
SHEATH PINNACLE 8F 10CM (SHEATH) IMPLANT
SHEATH PINNACLE 9F 10CM (SHEATH) IMPLANT
SHEATH PROBE COVER 6X72 (BAG) IMPLANT
TUBING SMART ABLATE COOLFLOW (TUBING) IMPLANT

## 2022-10-09 NOTE — Anesthesia Procedure Notes (Signed)
Procedure Name: Intubation Date/Time: 10/09/2022 7:43 AM  Performed by: Carolan Clines, CRNAPre-anesthesia Checklist: Patient identified, Emergency Drugs available, Suction available and Patient being monitored Patient Re-evaluated:Patient Re-evaluated prior to induction Oxygen Delivery Method: Circle System Utilized Preoxygenation: Pre-oxygenation with 100% oxygen Induction Type: IV induction Ventilation: Mask ventilation without difficulty Laryngoscope Size: Mac and 4 Grade View: Grade I Tube type: Oral Tube size: 7.5 mm Number of attempts: 1 Airway Equipment and Method: Stylet Placement Confirmation: ETT inserted through vocal cords under direct vision, positive ETCO2 and breath sounds checked- equal and bilateral Secured at: 23 cm Tube secured with: Tape Dental Injury: Teeth and Oropharynx as per pre-operative assessment

## 2022-10-09 NOTE — Anesthesia Preprocedure Evaluation (Signed)
Anesthesia Evaluation  Patient identified by MRN, date of birth, ID band Patient awake    Reviewed: Allergy & Precautions, NPO status , Patient's Chart, lab work & pertinent test results  Airway Mallampati: II  TM Distance: >3 FB Neck ROM: Full    Dental  (+) Dental Advisory Given   Pulmonary sleep apnea and Continuous Positive Airway Pressure Ventilation , former smoker   breath sounds clear to auscultation       Cardiovascular hypertension, Pt. on medications + Peripheral Vascular Disease  + dysrhythmias Atrial Fibrillation  Rhythm:Regular Rate:Normal     Neuro/Psych negative neurological ROS     GI/Hepatic Neg liver ROS,GERD  Medicated,,  Endo/Other  negative endocrine ROS    Renal/GU negative Renal ROS     Musculoskeletal   Abdominal   Peds  Hematology negative hematology ROS (+)   Anesthesia Other Findings   Reproductive/Obstetrics                             Anesthesia Physical Anesthesia Plan  ASA: 2  Anesthesia Plan: General   Post-op Pain Management: Tylenol PO (pre-op)*   Induction: Intravenous  PONV Risk Score and Plan: 2 and Dexamethasone, Ondansetron and Treatment may vary due to age or medical condition  Airway Management Planned: Oral ETT  Additional Equipment:   Intra-op Plan:   Post-operative Plan: Extubation in OR  Informed Consent: I have reviewed the patients History and Physical, chart, labs and discussed the procedure including the risks, benefits and alternatives for the proposed anesthesia with the patient or authorized representative who has indicated his/her understanding and acceptance.     Dental advisory given  Plan Discussed with: CRNA  Anesthesia Plan Comments:        Anesthesia Quick Evaluation

## 2022-10-09 NOTE — Anesthesia Postprocedure Evaluation (Signed)
Anesthesia Post Note  Patient: Damon [name]  Procedure(s) Performed: ATRIAL FIBRILLATION ABLATION     Patient location during evaluation: PACU Anesthesia Type: General Level of consciousness: awake and alert Pain management: pain level controlled Vital Signs Assessment: post-procedure vital signs reviewed and stable Respiratory status: spontaneous breathing, nonlabored ventilation, respiratory function stable and patient connected to nasal cannula oxygen Cardiovascular status: blood pressure returned to baseline and stable Postop Assessment: no apparent nausea or vomiting Anesthetic complications: no  There were no known notable events for this encounter.  Last Vitals:  Vitals:   10/09/22 1130 10/09/22 1200  BP: 113/67 121/65  Pulse: 68 67  Resp: 13 16  Temp:    SpO2: 93% 93%    Last Pain:  Vitals:   10/09/22 0951  TempSrc:   PainSc: 0-No pain                 Tiajuana Amass

## 2022-10-09 NOTE — Progress Notes (Signed)
Pt ambulated to and from bathroom to void with no signs of oozing from bilateral groin sites  

## 2022-10-09 NOTE — H&P (Signed)
Electrophysiology Office Note   Date:  10/09/2022   ID:  Damon, [name] 07/16/1946, MRN [id number]  PCP:  [name], MD  Cardiologist:   Primary Electrophysiologist:  Lamon Rotundo Meredith Leeds, MD    Chief Complaint: AF   History of Present Illness: Damon [name] is a 77 y.o. male who is being seen today for the evaluation of AF at the request of No ref. provider found. Presenting today for electrophysiology evaluation.  He has a history of hypertension, hyperlipidemia, sleep apnea, PAD, atrial fibrillation.  He is post ablation in 2014 with PVI, posterior wall isolation in 2020.  He presented to A-fib clinic 03/11/2022 after going into atrial fibrillation.  Cutting down trees at that time.  He had a cardioversion.  He has remained in sinus rhythm.  He did have evaluation for a possible convergent procedure, though he would like to proceed with endocardial ablation.  Today, denies symptoms of palpitations, chest pain, shortness of breath, orthopnea, PND, lower extremity edema, claudication, dizziness, presyncope, syncope, bleeding, or neurologic sequela. The patient is tolerating medications without difficulties. Plan ablation today.    Past Medical History:  Diagnosis Date   Bladder cancer ([place])    Hearing loss of both ears    History of colon polyps    BENIGN   Hyperlipidemia    Hypertension    Mitral regurgitation    OSA on CPAP    PAD (peripheral artery disease) ([facility])    ABI--  RIGHT SIDE NORMAL LEFT SIDE MODERATELY REDUCED W/ DISTAL LEFT SFA STENOSIS//  MILD CALDICATION   PAF (paroxysmal atrial fibrillation) ([place])     a. s/p PVI 2014; b. s/p redo PVI and CTI 01/2015   Vitamin D deficiency    Past Surgical History:  Procedure Laterality Date   ABLATION OF DYSRHYTHMIC FOCUS  01/26/2015   ATRIAL FIBRILLATION ABLATION N/A 07/27/2012   Procedure: ATRIAL FIBRILLATION ABLATION;  Surgeon: [name], MD;  Location: [facility] CATH LAB;  Service: Cardiovascular;   Laterality: N/A;   ATRIAL FIBRILLATION ABLATION N/A 08/25/2018   Procedure: ATRIAL FIBRILLATION ABLATION;  Surgeon: [name], MD;  Location: [place] CV LAB;  Service: Cardiovascular;  Laterality: N/A;   CARDIAC ELECTROPHYSIOLOGY STUDY AND ABLATION  07-27-2012  DR [name]   SUCCESSFUL ABLATION OF A-FIB   CARDIOVERSION  08/06/2012   Procedure: CARDIOVERSION;  Surgeon: [name], MD;  Location: [place];  Service: Cardiovascular;  Laterality: N/A;   CARDIOVERSION N/A 08/25/2014   Procedure: CARDIOVERSION;  Surgeon: Larey Dresser, MD;  Location: [place];  Service: Cardiovascular;  Laterality: N/A;   CARDIOVERSION N/A 11/13/2014   Procedure: CARDIOVERSION;  Surgeon: Sueanne Margarita, MD;  Location: [facility] ENDOSCOPY;  Service: Cardiovascular;  Laterality: N/A;   CARDIOVERSION N/A 12/28/2014   Procedure: CARDIOVERSION;  Surgeon: [name], MD;  Location: [place];  Service: Cardiovascular;  Laterality: N/A;   CARDIOVERSION N/A 01/30/2017   Procedure: CARDIOVERSION;  Surgeon: Josue Hector, MD;  Location: [facility] ENDOSCOPY;  Service: Cardiovascular;  Laterality: N/A;   CARDIOVERSION N/A 04/06/2017   Procedure: CARDIOVERSION;  Surgeon: Dorothy Spark, MD;  Location: [facility] ENDOSCOPY;  Service: Cardiovascular;  Laterality: N/A;   CARDIOVERSION N/A 05/25/2018   Procedure: CARDIOVERSION;  Surgeon: Skeet Latch, MD;  Location: [facility] ENDOSCOPY;  Service: Cardiovascular;  Laterality: N/A;   CARDIOVERSION N/A 08/09/2018   Procedure: CARDIOVERSION;  Surgeon: Buford Dresser, MD;  Location: [facility] ENDOSCOPY;  Service: Cardiovascular;  Laterality: N/A;   CARDIOVERSION N/A 07/04/2019   Procedure: CARDIOVERSION;  Surgeon: Dorothy Spark, MD;  Location: [facility] ENDOSCOPY;  Service: Cardiovascular;  Laterality: N/A;   CARDIOVERSION N/A 09/23/2019   Procedure: CARDIOVERSION;  Surgeon: Dorothy Spark, MD;  Location: [place];  Service: Cardiovascular;  Laterality: N/A;   CARDIOVERSION  N/A 08/07/2021   Procedure: CARDIOVERSION;  Surgeon: Sueanne Margarita, MD;  Location: [facility] ENDOSCOPY;  Service: Cardiovascular;  Laterality: N/A;   CARDIOVERSION N/A 03/17/2022   Procedure: CARDIOVERSION;  Surgeon: Sueanne Margarita, MD;  Location: [facility] ENDOSCOPY;  Service: Cardiovascular;  Laterality: N/A;   CARDIOVERSION N/A 06/26/2022   Procedure: CARDIOVERSION;  Surgeon: Pixie Casino, MD;  Location: [facility] ENDOSCOPY;  Service: Cardiovascular;  Laterality: N/A;   CATARACT EXTRACTION W/ INTRAOCULAR LENS  IMPLANT, BILATERAL     COLONOSCOPY WITH PROPOFOL N/A 02/13/2015   Procedure: COLONOSCOPY WITH PROPOFOL;  Surgeon: Garlan Fair, MD;  Location: [facility] ENDOSCOPY;  Service: Endoscopy;  Laterality: N/A;   CYSTOSCOPY W/ RETROGRADES Bilateral 04/12/2013   Procedure: CYSTOSCOPY WITH RETROGRADE PYELOGRAM;  Surgeon: [name], MD;  Location: [facility];  Service: Urology;  Laterality: Bilateral;   CYSTOSCOPY WITH RETROGRADE PYELOGRAM, URETEROSCOPY AND STENT PLACEMENT  03/06/2021   Procedure: CYSTOSCOPY WITH BILATERAL RETROGRADE PYELOGRAM, LEFT URETEROSCOPY AND STENT PLACEMENT;  Surgeon: [name], MD;  Location: [facility];  Service: Urology;;  1 HR   ELECTROPHYSIOLOGIC STUDY N/A 01/26/2015   Procedure: Atrial Fibrillation Ablation;  Surgeon: [name], MD;  Location: [place] CV LAB;  Service: Cardiovascular;  Laterality: N/A;   HOLMIUM LASER APPLICATION Left [id number]   Procedure: HOLMIUM LASER APPLICATION;  Surgeon: [name], MD;  Location: [facility];  Service: Urology;  Laterality: Left;   JOINT REPLACEMENT     LEFT HEART CATHETERIZATION WITH CORONARY ANGIOGRAM N/A 11/24/2013   Procedure: LEFT HEART CATHETERIZATION WITH CORONARY ANGIOGRAM;  Surgeon: [name], MD;  Location: [facility] CATH LAB;  Service: Cardiovascular;  Laterality: N/A;   PERCUTANEOUS CORONARY ROTOBLATOR INTERVENTION (PCI-R) N/A 11/28/2013   Procedure:  PERCUTANEOUS CORONARY ROTOBLATOR INTERVENTION (PCI-R);  Surgeon: [name], MD;  Location: [facility] CATH LAB;  Service: Cardiovascular;  Laterality: N/A;   PERCUTANEOUS CORONARY STENT INTERVENTION (PCI-S)  11/24/2013   Procedure: PERCUTANEOUS CORONARY STENT INTERVENTION (PCI-S);  Surgeon: [name], MD;  Location: [facility] CATH LAB;  Service: Cardiovascular;;   TEE WITHOUT CARDIOVERSION  07/26/2012   Procedure: TRANSESOPHAGEAL ECHOCARDIOGRAM (TEE);  Surgeon: [name], MD;  Location: [facility] ENDOSCOPY;  Service: Cardiovascular;  Laterality: N/A;   TEE WITHOUT CARDIOVERSION N/A 08/25/2014   Procedure: TRANSESOPHAGEAL ECHOCARDIOGRAM (TEE);  Surgeon: Larey Dresser, MD;  Location: [place];  Service: Cardiovascular;  Laterality: N/A;   TEE WITHOUT CARDIOVERSION  01/26/2015   Procedure: Transesophageal Echocardiogram (Tee);  Surgeon: [name], MD;  Location: [place] CV LAB;  Service: Cardiovascular;;   TEE WITHOUT CARDIOVERSION N/A 07/04/2019   Procedure: TRANSESOPHAGEAL ECHOCARDIOGRAM (TEE);  Surgeon: Dorothy Spark, MD;  Location: [name] ENDOSCOPY;  Service: Cardiovascular;  Laterality: N/A;   TONSILLECTOMY  age 28   TOTAL HIP ARTHROPLASTY  07/25/2011   Procedure: TOTAL HIP ARTHROPLASTY ANTERIOR APPROACH;  Surgeon: Mcarthur Rossetti;  Location: [facility] ORS;  Service: Orthopedics;  Laterality: Right;   TOTAL HIP ARTHROPLASTY Left 02/08/2010   TRANSURETHRAL RESECTION OF BLADDER TUMOR WITH GYRUS (TURBT-GYRUS) N/A 04/12/2013   Procedure: TRANSURETHRAL RESECTION OF BLADDER TUMOR WITH GYRUS (TURBT-GYRUS);  Surgeon: [name], MD;  Location: [facility];  Service: Urology;  Laterality: N/A;     Current  Facility-Administered Medications  Medication Dose Route Frequency Provider Last Rate Last Admin   0.9 %  sodium chloride infusion   Intravenous Continuous Constance Haw, MD 50 mL/hr at 10/09/22 0600 New Bag at 10/09/22 0600    Allergies:   Patient  has no known allergies.   Social History:  The patient  reports that he quit smoking about 41 years ago. His smoking use included cigarettes. He has a 20.00 pack-year smoking history. He has never used smokeless tobacco. He reports that he does not drink alcohol and does not use drugs.   Family History:  The patient's family history includes Heart attack in his mother; Heart disease in his father and mother; Heart failure in his father.   ROS:  Please see the history of present illness.   Otherwise, review of systems is positive for none.   All other systems are reviewed and negative.   PHYSICAL EXAM: VS:  BP 126/69   Pulse (!) 50   Temp 97.8 F (36.6 C) (Temporal)   Resp 18   Ht 5\' 9"  (1.753 m)   Wt 90.7 kg   SpO2 96%   BMI 29.53 kg/m  , BMI Body mass index is 29.53 kg/m. GEN: Well nourished, well developed, in no acute distress  HEENT: normal  Neck: no JVD, carotid bruits, or masses Cardiac: RRR; no murmurs, rubs, or gallops,no edema  Respiratory:  clear to auscultation bilaterally, normal work of breathing GI: soft, nontender, nondistended, + BS MS: no deformity or atrophy  Skin: warm and dry Neuro:  Strength and sensation are intact Psych: euthymic mood, full affect   Recent Labs: 10/07/2022: ALT 18; BUN 13; Creat 0.91; Hemoglobin 15.1; Magnesium 2.1; Platelets 208; Potassium 4.1; Sodium 141; TSH 0.86    Lipid Panel     Component Value Date/Time   CHOL 132 10/07/2022 1457   TRIG 75 10/07/2022 1457   HDL 61 10/07/2022 1457   CHOLHDL 2.2 10/07/2022 1457   VLDL 19 01/15/2017 0932   LDLCALC 55 10/07/2022 1457     Wt Readings from Last 3 Encounters:  10/09/22 90.7 kg  10/07/22 91.6 kg  07/24/22 93 kg      Other studies Reviewed: Additional studies/ records that were reviewed today include: TEE 07/04/19  Review of the above records today demonstrates:   1. Left ventricular ejection fraction, by visual estimation, is 55 to  60%. The left ventricle has normal  function. Normal left ventricular size.  Left ventricular septal wall thickness was normal. There is no left  ventricular hypertrophy.   2. Left ventricular diastolic function could not be evaluated.   3. The left ventricle has no regional wall motion abnormalities.   4. Global right ventricle has normal systolic function.The right  ventricular size is normal. No increase in right ventricular wall  thickness.   5. Left atrial size was normal.   6. No thrombus in the left atrium or left atrial appendage.   7. Right atrial size was normal.   8. The mitral valve is normal in structure. Mild mitral valve  regurgitation. No evidence of mitral stenosis.   9. The tricuspid valve is normal in structure. Tricuspid valve  regurgitation is mild.  10. The aortic valve is normal in structure. Aortic valve regurgitation is  not visualized. No evidence of aortic valve sclerosis or stenosis.  11. The pulmonic valve was normal in structure. Pulmonic valve  regurgitation is not visualized.  12. The inferior vena cava is normal in size with  greater than 50%  respiratory variability, suggesting right atrial pressure of 3 mmHg.  13. No intracardiac source of embolism. This study was followed by a  successful cardioversion.    ASSESSMENT AND PLAN:  1.  Persistent atrial fibrillation: RYERSON STUKEL has presented today for surgery, with the diagnosis of AF.  The various methods of treatment have been discussed with the patient and family. After consideration of risks, benefits and other options for treatment, the patient has consented to  Procedure(s): Catheter ablation as a surgical intervention .  Risks include but not limited to complete heart block, stroke, esophageal damage, nerve damage, bleeding, vascular damage, tamponade, perforation, MI, and death. The patient's history has been reviewed, patient examined, no change in status, stable for surgery.  I have reviewed the patient's chart and labs.   Questions were answered to the patient's satisfaction.    Damon Wishon Curt Bears, MD 10/09/2022 7:04 AM

## 2022-10-09 NOTE — Progress Notes (Signed)
HOB raised to 30 degrees

## 2022-10-09 NOTE — Discharge Instructions (Signed)
Post procedure care instructions No driving for 4 days. No lifting over 5 lbs for 1 week. No vigorous or sexual activity for 1 week. You may return to work/your usual activities on 10/17/22. Keep procedure site clean & dry. If you notice increased pain, swelling, bleeding or pus, call/return!  You may shower after 24 hours, but no soaking in baths/hot tubs/pools for 1 week.    You have an appointment set up with the [facility].  Multiple studies have shown that being followed by a dedicated atrial fibrillation clinic in addition to the standard care you receive from your other physicians improves health. We believe that enrollment in the atrial fibrillation clinic will allow [place] to better care for you.   The phone number to the [facility] is [phone]. The clinic is staffed Monday through Friday from 8:30am to 5pm.  Directions: The clinic is located in the [facility] the hospital at the MAIN ENTRANCE "A", use [address] to the 6th floor.  Registration desk to the right of elevators on 6th floor  If you have any trouble locating the clinic, please don't hesitate to call [phone].

## 2022-10-09 NOTE — Transfer of Care (Signed)
Immediate Anesthesia Transfer of Care Note  Patient: Damon [name]  Procedure(s) Performed: ATRIAL FIBRILLATION ABLATION  Patient Location: Cath Lab  Anesthesia Type:General  Level of Consciousness: awake, alert , and oriented  Airway & Oxygen Therapy: Patient Spontanous Breathing  Post-op Assessment: Report given to RN and Post -op Vital signs reviewed and stable  Post vital signs: Reviewed and stable  Last Vitals:  Vitals Value Taken Time  BP 126/73 10/09/22 0913  Temp    Pulse 58 10/09/22 0915  Resp 11 10/09/22 0915  SpO2 94 % 10/09/22 0915  Vitals shown include unvalidated device data.  Last Pain:  Vitals:   10/09/22 0559  TempSrc: Temporal  PainSc: 0-No pain         Complications: There were no known notable events for this encounter.

## 2022-10-10 ENCOUNTER — Encounter (HOSPITAL_COMMUNITY): Payer: Self-pay | Admitting: Cardiology

## 2022-10-16 ENCOUNTER — Telehealth (HOSPITAL_COMMUNITY): Payer: Self-pay | Admitting: *Deleted

## 2022-10-16 MED ORDER — DILTIAZEM HCL 30 MG PO TABS: ORAL_TABLET | ORAL | 1 refills | Status: AC

## 2022-10-16 MED ORDER — PANTOPRAZOLE SODIUM 40 MG PO TBEC: 40.0000 mg | DELAYED_RELEASE_TABLET | Freq: Every day | ORAL | 0 refills | Status: DC

## 2022-10-16 NOTE — Telephone Encounter (Signed)
Patient having intermittent AFib post ablation. HRs 90-105. Discussed with Adline Peals PA will call in PRN cardizem to use for elevated rates. Post ablation afib expectations reviewed with patient. He will call if becomes persistent. Pt in agreement.

## 2022-10-20 ENCOUNTER — Telehealth (HOSPITAL_COMMUNITY): Payer: Self-pay

## 2022-10-20 NOTE — Telephone Encounter (Signed)
Patient states he thinks he has been in A-fib since 3/21. He had his ablation performed on 3/21. B/P- 115/74, HR 88. His only symptom is feeling tired. Per Tilda Franco he should only take the Diltiazem 30 mg If his HR is 100 or above and top number B/P needs to be above 100. I have moved his appointment up for next week on 4/9 @ 8:30am with [facility]. Communicated with patient and he verbalized understanding.

## 2022-10-23 NOTE — Progress Notes (Signed)
Synopsis: Referred in April 2024 for abnormal CT chest by [name], MD  Subjective:   PATIENT ID: Damon [name] GENDER: male DOB: [birth year], MRN: [id number]  Chief Complaint  Patient presents with   Consult    Abnormal CT    This is a 77 year old gentleman, past medical history of bladder cancer, hypertension, hyperlipidemia, OSA on CPAP.  Here today for evaluation of a abnormal CT chest with some right infiltrates.  He had some tree-in-bud infiltrates that were seen on imaging.  He has no respiratory symptoms at this time.  These were found incidentally on a coronary CTA.    Past Medical History:  Diagnosis Date   Bladder cancer    Hearing loss of both ears    History of colon polyps    BENIGN   Hyperlipidemia    Hypertension    Mitral regurgitation    OSA on CPAP    PAD (peripheral artery disease)    ABI--  RIGHT SIDE NORMAL LEFT SIDE MODERATELY REDUCED W/ DISTAL LEFT SFA STENOSIS//  MILD CALDICATION   PAF (paroxysmal atrial fibrillation)     a. s/p PVI 2014; b. s/p redo PVI and CTI 01/2015   Vitamin D deficiency      Family History  Problem Relation Age of Onset   Heart attack Mother    Heart disease Mother    Heart failure Father    Heart disease Father      Past Surgical History:  Procedure Laterality Date   ABLATION OF DYSRHYTHMIC FOCUS  01/26/2015   ATRIAL FIBRILLATION ABLATION N/A 07/27/2012   Procedure: ATRIAL FIBRILLATION ABLATION;  Surgeon: Hillis Range, MD;  [facility]: [facility];  Service: Cardiovascular;  Laterality: N/A;   ATRIAL FIBRILLATION ABLATION N/A 08/25/2018   Procedure: ATRIAL FIBRILLATION ABLATION;  Surgeon: Hillis Range, MD;  [facility]: [facility];  Service: Cardiovascular;  Laterality: N/A;   ATRIAL FIBRILLATION ABLATION N/A 10/09/2022   Procedure: ATRIAL FIBRILLATION ABLATION;  Surgeon: Regan Lemming, MD;  [facility]: [facility];  Service: Cardiovascular;  Laterality: N/A;   CARDIAC ELECTROPHYSIOLOGY STUDY  AND ABLATION  07-27-2012  DR Damon [name]   SUCCESSFUL ABLATION OF A-FIB   CARDIOVERSION  08/06/2012   Procedure: CARDIOVERSION;  Surgeon: Pricilla Riffle, MD;  Location: [facility] ENDOSCOPY;  Service: Cardiovascular;  Laterality: N/A;   CARDIOVERSION N/A 08/25/2014   Procedure: CARDIOVERSION;  Surgeon: Laurey Morale, MD;  Location: [facility] ENDOSCOPY;  Service: Cardiovascular;  Laterality: N/A;   CARDIOVERSION N/A 11/13/2014   Procedure: CARDIOVERSION;  Surgeon: Quintella Reichert, MD;  Location: [facility] ENDOS[facility];  Service: Cardiovascular;  Laterality: N/A;   CARDIOVERSION N/A 12/28/2014   Procedure: CARDIOVERSION;  Surgeon: Pricilla Riffle, MD;  Location: [facility] ENDOSCOPY;  Service: Cardiovascular;  Laterality: N/A;   CARDIOVERSION N/A 01/30/2017   Procedure: CARDIOVERSION;  Surgeon: Wendall Stade, MD;  Location: [facility] ENDOSCOPY;  Service: Cardiovascular;  Laterality: N/A;   CARDIOVERSION N/A 04/06/2017   Procedure: CARDIOVERSION;  Surgeon: Lars Masson, MD;  Location: [facility] ENDOSCOPY;  Service: Cardiovascular;  Laterality: N/A;   CARDIOVERSION N/A 05/25/2018   Procedure: CARDIOVERSION;  Surgeon: Chilton Si, MD;  Location: [facility] ENDOSCOPY;  Service: Cardiovascular;  Laterality: N/A;   CARDIOVERSION N/A 08/09/2018   Procedure: CARDIOVERSION;  Surgeon: Jodelle Red, MD;  Location: [facility] ENDOSCOPY;  Service: Cardiovascular;  Laterality: N/A;   CARDIOVERSION N/A 07/04/2019   Procedure: CARDIOVERSION;  Surgeon: Lars Masson, MD;  Location: [facility] ENDOSCOPY;  Service: Cardiovascular;  Laterality: N/A;   CARDIOVERSION  N/A 09/23/2019   Procedure: CARDIOVERSION;  Surgeon: Lars MassonNelson, Damon H, MD;  [facility];  Service: Cardiovascular;  Laterality: N/A;   CARDIOVERSION N/A 08/07/2021   Procedure: CARDIOVERSION;  Surgeon: Quintella Reicherturner, Damon R, MD;  [facility];  Service: Cardiovascular;  Laterality: N/A;   CARDIOVERSION N/A 03/17/2022   Procedure: CARDIOVERSION;  Surgeon: Quintella Reicherturner, Damon R, MD;  [facility];  Service: Cardiovascular;  Laterality: N/A;   CARDIOVERSION N/A 06/26/2022   Procedure: CARDIOVERSION;  Surgeon: Chrystie NoseHilty, Damon C, MD;  [facility];  Service: Cardiovascular;  Laterality: N/A;   CATARACT EXTRACTION W/ INTRAOCULAR LENS  IMPLANT, BILATERAL     COLONOSCOPY WITH PROPOFOL N/A 02/13/2015   Procedure: COLONOSCOPY WITH PROPOFOL;  Surgeon: Damon BumpersMartin K Johnson, MD;  Location: [facility] ENDOSCOPY;  Service: Endoscopy;  Laterality: N/A;   CYSTOSCOPY W/ RETROGRADES Bilateral 04/12/2013   Procedure: CYSTOSCOPY WITH RETROGRADE PYELOGRAM;  Surgeon: Damon Acheheodore Manny, MD;  [facility] [place];  Service: Urology;  Laterality: Bilateral;   CYSTOSCOPY WITH RETROGRADE PYELOGRAM, URETEROSCOPY AND STENT PLACEMENT  03/06/2021   Procedure: CYSTOSCOPY WITH BILATERAL RETROGRADE PYELOGRAM, LEFT URETEROSCOPY AND STENT PLACEMENT;  Surgeon: Damon AcheManny, Theodore, MD;  [facility] [place];  Service: Urology;;  1 HR   ELECTROPHYSIOLOGIC STUDY N/A 01/26/2015   Procedure: Atrial Fibrillation Ablation;  Surgeon: Hillis RangeJames Allred, MD;  [facility];  Service: Cardiovascular;  Laterality: N/A;   HOLMIUM LASER APPLICATION Left 03/06/2021   Procedure: HOLMIUM LASER APPLICATION;  Surgeon: Damon AcheManny, Theodore, MD;  [facility] [place];  Service: Urology;  Laterality: Left;   JOINT REPLACEMENT     LEFT HEART CATHETERIZATION WITH CORONARY ANGIOGRAM N/A 11/24/2013   Procedure: LEFT HEART CATHETERIZATION WITH CORONARY ANGIOGRAM;  Surgeon: Damon Hazelhristopher D McAlhany, MD;  [facility];  Service: Cardiovascular;  Laterality: N/A;   PERCUTANEOUS CORONARY ROTOBLATOR INTERVENTION (PCI-[name]) N/A 11/28/2013   Procedure: PERCUTANEOUS CORONARY ROTOBLATOR INTERVENTION (PCI-[name]);  Surgeon: Damon Hazelhristopher D McAlhany, MD;  [facility];  Service: Cardiovascular;  Laterality: N/A;   PERCUTANEOUS CORONARY STENT INTERVENTION (PCI-S)  11/24/2013   Procedure: PERCUTANEOUS  CORONARY STENT INTERVENTION (PCI-S);  Surgeon: Damon Hazelhristopher D McAlhany, MD;  [facility];  Service: Cardiovascular;;   TEE WITHOUT CARDIOVERSION  07/26/2012   Procedure: TRANSESOPHAGEAL ECHOCARDIOGRAM (TEE);  Surgeon: Pricilla RifflePaula V Ross, MD;  [facility];  Service: Cardiovascular;  Laterality: N/A;   TEE WITHOUT CARDIOVERSION N/A 08/25/2014   Procedure: TRANSESOPHAGEAL ECHOCARDIOGRAM (TEE);  Surgeon: Laurey Moralealton S McLean, MD;  [facility];  Service: Cardiovascular;  Laterality: N/A;   TEE WITHOUT CARDIOVERSION  01/26/2015   Procedure: Transesophageal Echocardiogram (Tee);  Surgeon: Vesta MixerPhilip J Nahser, MD;  [facility];  Service: Cardiovascular;;   TEE WITHOUT CARDIOVERSION N/A 07/04/2019   Procedure: TRANSESOPHAGEAL ECHOCARDIOGRAM (TEE);  Surgeon: Lars MassonNelson, Damon H, MD;  [facility];  Service: Cardiovascular;  Laterality: N/A;   TONSILLECTOMY  age 77   TOTAL HIP ARTHROPLASTY  07/25/2011   Procedure: TOTAL HIP ARTHROPLASTY ANTERIOR APPROACH;  Surgeon: Kathryne Hitchhristopher Y Blackman;  Location: [facility]S;  Service: Orthopedics;  Laterality: Right;   TOTAL HIP ARTHROPLASTY Left 02/08/2010   TRANSURETHRAL RESECTION OF BLADDER TUMOR WITH GYRUS (TURBT-GYRUS) N/A 04/12/2013   Procedure: TRANSURETHRAL RESECTION OF BLADDER TUMOR WITH GYRUS (TURBT-GYRUS);  Surgeon: Damon Acheheodore Manny, MD;  [facility] [place];  Service: Urology;  Laterality: N/A;    Social History   Socioeconomic History   Marital status: Married    Spouse name: Not on file  Number of children: Not on file   Years of education: Not on file   Highest education level: Not on file  Occupational History   Not on file  Tobacco Use   Smoking status: Former    Packs/day: 2.00    Years: 10.00    Additional pack years: 0.00    Total pack years: 20.00    Types: Cigarettes    Quit date: 09/20/1981    Years since quitting: 41.1   Smokeless tobacco: Never   Tobacco comments:    Former  smoker 01/07/22  Vaping Use   Vaping Use: Never used  Substance and Sexual Activity   Alcohol use: No    Comment: pt states he has stopped drinking alcohol   Drug use: No   Sexual activity: Not on file  Other Topics Concern   Not on file  Social History Narrative   Not on file   Social Determinants of Health   Financial Resource Strain: Not on file  Food Insecurity: Not on file  Transportation Needs: Not on file  Physical Activity: Not on file  Stress: Not on file  Social Connections: Not on file  Intimate Partner Violence: Not on file     No Known Allergies   Outpatient Medications Prior to Visit  Medication Sig Dispense Refill   bisoprolol-hydrochlorothiazide (ZIAC) 10-6.25 MG tablet Take  1 tablet  every Morning  for BP (Patient taking differently: Take 0.5 tablets by mouth daily.) 90 tablet 3   Cholecalciferol (VITAMIN D3) 125 MCG (5000 UT) CAPS Take 10,000 units Daily 30 capsule    diltiazem (CARDIZEM CD) 180 MG 24 hr capsule TAKE 1 CAPSULE BY MOUTH DAILY 90 capsule 3   diltiazem (CARDIZEM) 30 MG tablet Take 1 tablet every 4 hours AS NEEDED for AFIB heart rate >100 as long as top BP >100. 30 tablet 1   ezetimibe (ZETIA) 10 MG tablet Take  1 tablet  Daily  for Cholesterol                                                                         /                                      TAKE                         BY                               MOUTH 90 tablet 3   Multiple Vitamin (MULTIVITAMIN WITH MINERALS) TABS tablet Take 1 tablet Daily     nitroGLYCERIN (NITROSTAT) 0.4 MG SL tablet DISSOLVE ONE TABLET UNDER THE TONGUE EVERY 5 MINUTES AS NEEDED FOR CHEST PAIN.DO NOT EXCEED A TOTAL OF 3 DOSES IN 15 MINUTES (Patient taking differently: DISSOLVE ONE TABLET UNDER THE TONGUE EVERY 5 MINUTES AS NEEDED FOR CHEST PAIN.DO NOT EXCEED A TOTAL OF 3 DOSES IN 15 MINUTES (has on hand)) 25 tablet 6   pantoprazole (PROTONIX) 40 MG tablet Take 1 tablet (40 mg total) by mouth  daily. 45 tablet 0    Polyvinyl Alcohol-Povidone (REFRESH OP) Place 1 drop into both eyes 3 (three) times daily as needed (dry/irritated eyes.).     rosuvastatin (CRESTOR) 40 MG tablet TAKE ONE-HALF TABLET BY MOUTH  DAILY FOR CHOLESTEROL 45 tablet 3   XARELTO 20 MG TABS tablet TAKE 1 TABLET BY MOUTH DAILY  WITH SUPPER 90 tablet 3   No facility-administered medications prior to visit.    Review of Systems  Constitutional:  Negative for chills, fever, malaise/fatigue and weight loss.  HENT:  Negative for hearing loss, sore throat and tinnitus.   Eyes:  Negative for blurred vision and double vision.  Respiratory:  Negative for cough, hemoptysis, sputum production, shortness of breath, wheezing and stridor.   Cardiovascular:  Negative for chest pain, palpitations, orthopnea, leg swelling and PND.  Gastrointestinal:  Negative for abdominal pain, constipation, diarrhea, heartburn, nausea and vomiting.  Genitourinary:  Negative for dysuria, hematuria and urgency.  Musculoskeletal:  Negative for joint pain and myalgias.  Skin:  Negative for itching and rash.  Neurological:  Negative for dizziness, tingling, weakness and headaches.  Endo/Heme/Allergies:  Negative for environmental allergies. Does not bruise/bleed easily.  Psychiatric/Behavioral:  Negative for depression. The patient is not nervous/anxious and does not have insomnia.   All other systems reviewed and are negative.    Objective:  Physical Exam Vitals reviewed.  Constitutional:      General: He is not in acute distress.    Appearance: He is well-developed. He is obese.  HENT:     Head: Normocephalic and atraumatic.  Eyes:     General: No scleral icterus.    Conjunctiva/sclera: Conjunctivae normal.     Pupils: Pupils are equal, round, and reactive to light.  Neck:     Vascular: No JVD.     Trachea: No tracheal deviation.  Cardiovascular:     Rate and Rhythm: Normal rate and regular rhythm.     Heart sounds: Normal heart sounds. No murmur  heard. Pulmonary:     Effort: Pulmonary effort is normal. No tachypnea, accessory muscle usage or respiratory distress.     Breath sounds: No stridor. No wheezing, rhonchi or rales.  Abdominal:     General: Bowel sounds are normal. There is no distension.     Palpations: Abdomen is soft.     Tenderness: There is no abdominal tenderness.  Musculoskeletal:        General: No tenderness.     Cervical back: Neck supple.  Lymphadenopathy:     Cervical: No cervical adenopathy.  Skin:    General: Skin is warm and dry.     Capillary Refill: Capillary refill takes less than 2 seconds.     Findings: No rash.  Neurological:     Mental Status: He is alert and oriented to person, place, and time.  Psychiatric:        Behavior: Behavior normal.      Vitals:   10/24/22 0956  BP: 120/76  Pulse: 88  SpO2: 96%  Weight: 204 lb (92.5 kg)  Height: 5\' 9"  (1.753 m)   96% on RA BMI Readings from Last 3 Encounters:  10/24/22 30.13 kg/m  10/09/22 29.53 kg/m  10/07/22 29.83 kg/m   Wt Readings from Last 3 Encounters:  10/24/22 204 lb (92.5 kg)  10/09/22 200 lb (90.7 kg)  10/07/22 202 lb (91.6 kg)     CBC    Component Value Date/Time   WBC 6.8 10/07/2022 1457   RBC 4.98 10/07/2022 1457  HGB 15.1 10/07/2022 1457   HGB 14.5 09/18/2022 1013   HCT 44.0 10/07/2022 1457   HCT 42.5 09/18/2022 1013   PLT 208 10/07/2022 1457   PLT 204 09/18/2022 1013   MCV 88.4 10/07/2022 1457   MCV 90 09/18/2022 1013   MCH 30.3 10/07/2022 1457   MCHC 34.3 10/07/2022 1457   RDW 12.0 10/07/2022 1457   RDW 12.0 09/18/2022 1013   LYMPHSABS 1,571 10/07/2022 1457   MONOABS 0.6 08/02/2018 0955   EOSABS 170 10/07/2022 1457   BASOSABS 61 10/07/2022 1457     Chest Imaging:  Coronary CTA March 2024: Right middle lobe tree-in-bud infiltrates. He also has some peripheral subpleural changes in lung parenchyma. The patient's images have been independently reviewed by me.    Pulmonary Functions Testing  Results:     No data to display          FeNO:   Pathology:   Echocardiogram:   Heart Catheterization:     Assessment & Plan:     ICD-10-CM   1. Lung nodule  R91.1 CT CHEST HIGH RESOLUTION    2. Pulmonary infiltrate  R91.8 CT CHEST HIGH RESOLUTION      Discussion:  This is a 77 year old gentleman, former smoker quit 1983, smoked on and off for a couple years.  At times was a heavier smoker.  He has some right middle lobe changes with tree-in-bud infiltrates and some subpleural peripheral changes on CT which could just represent atelectasis.  Plan: Will have a repeat HRCT chest in 6 months. Patient follow-up with [place] after CT complete   Current Outpatient Medications:    bisoprolol-hydrochlorothiazide (ZIAC) 10-6.25 MG tablet, Take  1 tablet  every Morning  for BP (Patient taking differently: Take 0.5 tablets by mouth daily.), Disp: 90 tablet, Rfl: 3   Cholecalciferol (VITAMIN D3) 125 MCG (5000 UT) CAPS, Take 10,000 units Daily, Disp: 30 capsule, Rfl:    diltiazem (CARDIZEM CD) 180 MG 24 hr capsule, TAKE 1 CAPSULE BY MOUTH DAILY, Disp: 90 capsule, Rfl: 3   diltiazem (CARDIZEM) 30 MG tablet, Take 1 tablet every 4 hours AS NEEDED for AFIB heart rate >100 as long as top BP >100., Disp: 30 tablet, Rfl: 1   ezetimibe (ZETIA) 10 MG tablet, Take  1 tablet  Daily  for Cholesterol                                                                         /                                      TAKE                         BY                               MOUTH, Disp: 90 tablet, Rfl: 3   Multiple Vitamin (MULTIVITAMIN WITH MINERALS) TABS tablet, Take 1 tablet Daily, Disp: , Rfl:    nitroGLYCERIN (NITROSTAT) 0.4 MG SL tablet, DISSOLVE ONE TABLET UNDER THE TONGUE EVERY  5 MINUTES AS NEEDED FOR CHEST PAIN.DO NOT EXCEED A TOTAL OF 3 DOSES IN 15 MINUTES (Patient taking differently: DISSOLVE ONE TABLET UNDER THE TONGUE EVERY 5 MINUTES AS NEEDED FOR CHEST PAIN.DO NOT EXCEED A TOTAL OF 3 DOSES IN 15  MINUTES (has on hand)), Disp: 25 tablet, Rfl: 6   pantoprazole (PROTONIX) 40 MG tablet, Take 1 tablet (40 mg total) by mouth daily., Disp: 45 tablet, Rfl: 0   Polyvinyl Alcohol-Povidone (REFRESH OP), Place 1 drop into both eyes 3 (three) times daily as needed (dry/irritated eyes.)., Disp: , Rfl:    rosuvastatin (CRESTOR) 40 MG tablet, TAKE ONE-HALF TABLET BY MOUTH  DAILY FOR CHOLESTEROL, Disp: 45 tablet, Rfl: 3   XARELTO 20 MG TABS tablet, TAKE 1 TABLET BY MOUTH DAILY  WITH SUPPER, Disp: 90 tablet, Rfl: 3   Josephine IgoBradley L Corneshia Hines, DO [facility] 10/24/2022 10:22 AM

## 2022-10-24 ENCOUNTER — Encounter: Payer: Self-pay | Admitting: Pulmonary Disease

## 2022-10-24 ENCOUNTER — Ambulatory Visit (INDEPENDENT_AMBULATORY_CARE_PROVIDER_SITE_OTHER): Payer: Medicare Other | Admitting: Pulmonary Disease

## 2022-10-24 VITALS — BP 120/76 | HR 88 | Ht 69.0 in | Wt 204.0 lb

## 2022-10-24 DIAGNOSIS — R911 Solitary pulmonary nodule: Secondary | ICD-10-CM | POA: Diagnosis not present

## 2022-10-24 DIAGNOSIS — R918 Other nonspecific abnormal finding of lung field: Secondary | ICD-10-CM | POA: Diagnosis not present

## 2022-10-24 NOTE — Patient Instructions (Signed)
Thank you for visiting Dr. Tonia Brooms at [facility]. Today we recommend the following:  Orders Placed This Encounter  Procedures   CT CHEST HIGH RESOLUTION   Return in about 6 months (around 04/25/2023) for with Kandice Robinsons, NP, or Dr. Tonia Brooms, after CT Chest.    Please do your part to reduce the spread of COVID-19.

## 2022-10-28 ENCOUNTER — Ambulatory Visit (HOSPITAL_COMMUNITY)
Admission: RE | Admit: 2022-10-28 | Discharge: 2022-10-28 | Disposition: A | Discharge status: Home / Self Care | Payer: Medicare Other | Source: Ambulatory Visit | Attending: Physician Assistant | Admitting: Physician Assistant

## 2022-10-28 VITALS — BP 128/86 | HR 89 | Ht 69.0 in | Wt 202.0 lb

## 2022-10-28 DIAGNOSIS — D6869 Other thrombophilia: Secondary | ICD-10-CM | POA: Diagnosis not present

## 2022-10-28 DIAGNOSIS — I2510 Atherosclerotic heart disease of native coronary artery without angina pectoris: Secondary | ICD-10-CM | POA: Insufficient documentation

## 2022-10-28 DIAGNOSIS — G4733 Obstructive sleep apnea (adult) (pediatric): Secondary | ICD-10-CM | POA: Diagnosis not present

## 2022-10-28 DIAGNOSIS — I4892 Unspecified atrial flutter: Secondary | ICD-10-CM | POA: Insufficient documentation

## 2022-10-28 DIAGNOSIS — Z79899 Other long term (current) drug therapy: Secondary | ICD-10-CM | POA: Diagnosis not present

## 2022-10-28 DIAGNOSIS — Z7901 Long term (current) use of anticoagulants: Secondary | ICD-10-CM | POA: Insufficient documentation

## 2022-10-28 DIAGNOSIS — I4819 Other persistent atrial fibrillation: Secondary | ICD-10-CM | POA: Insufficient documentation

## 2022-10-28 DIAGNOSIS — I10 Essential (primary) hypertension: Secondary | ICD-10-CM | POA: Diagnosis not present

## 2022-10-28 NOTE — Patient Instructions (Signed)
Cardioversion scheduled for: Monday, April 22nd   - Arrive at the [facility] and go to admitting at 930am   - Do not eat or drink anything after midnight the night prior to your procedure.   - Take all your morning medication (except diabetic medications) with a sip of water prior to arrival.  - You will not be able to drive home after your procedure.    - Do NOT miss any doses of your blood thinner - if you should miss a dose please notify our office immediately.   - If you feel as if you go back into normal rhythm prior to scheduled cardioversion, please notify our office immediately.   If your procedure is canceled in the cardioversion suite you will be charged a cancellation fee.
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
## 2022-11-06 ENCOUNTER — Ambulatory Visit (HOSPITAL_COMMUNITY)
Admission: RE | Admit: 2022-11-06 | Discharge: 2022-11-06 | Disposition: A | Discharge status: Home / Self Care | Payer: Medicare Other | Source: Ambulatory Visit | Attending: Physician Assistant | Admitting: Physician Assistant

## 2022-11-06 ENCOUNTER — Ambulatory Visit (HOSPITAL_COMMUNITY): Payer: Medicare Other | Admitting: Physician Assistant

## 2022-11-06 DIAGNOSIS — I4819 Other persistent atrial fibrillation: Secondary | ICD-10-CM | POA: Diagnosis present

## 2022-11-06 LAB — BASIC METABOLIC PANEL
Anion gap: 11 (ref 5–15)
BUN: 10 mg/dL (ref 8–23)
CO2: 23 mmol/L (ref 22–32)
Calcium: 8.9 mg/dL (ref 8.9–10.3)
Chloride: 105 mmol/L (ref 98–111)
Creatinine, Ser: 1.03 mg/dL (ref 0.61–1.24)
GFR, Estimated: >60 mL/min (ref >60)
Glucose, Bld: 141 mg/dL — ABNORMAL HIGH (ref 70–99)
Potassium: 4.1 mmol/L (ref 3.5–5.1)
Sodium: 139 mmol/L (ref 135–145)

## 2022-11-06 LAB — CBC
HCT: 45.2 % (ref 39.0–52.0)
Hemoglobin: 15.5 g/dL (ref 13.0–17.0)
MCH: 30.5 pg (ref 26.0–34.0)
MCHC: 34.3 g/dL (ref 30.0–36.0)
MCV: 88.8 fL (ref 80.0–100.0)
Platelets: 187 10*3/uL (ref 150–400)
RBC: 5.09 MIL/uL (ref 4.22–5.81)
RDW: 12 % (ref 11.5–15.5)
WBC: 6 10*3/uL (ref 4.0–10.5)
nRBC: 0 % (ref 0.0–0.2)

## 2022-11-07 NOTE — Progress Notes (Signed)
Msg left, npo after midnight, Sunday, May take bp and blood thinner meds on Monday morning with sips of water, please have transportation for home after procedure, procedure at 1030, arrive at hospital by 0930

## 2022-11-10 ENCOUNTER — Ambulatory Visit (HOSPITAL_COMMUNITY)
Admission: RE | Admit: 2022-11-10 | Discharge: 2022-11-10 | Disposition: A | Discharge status: Home / Self Care | Payer: Medicare Other | Attending: Internal Medicine | Admitting: Internal Medicine

## 2022-11-10 ENCOUNTER — Encounter (HOSPITAL_COMMUNITY)
Admission: RE | Disposition: A | Discharge status: Home / Self Care | Payer: Self-pay | Source: Home / Self Care | Attending: Internal Medicine

## 2022-11-10 ENCOUNTER — Other Ambulatory Visit: Payer: Self-pay

## 2022-11-10 ENCOUNTER — Ambulatory Visit (HOSPITAL_BASED_OUTPATIENT_CLINIC_OR_DEPARTMENT_OTHER): Payer: Medicare Other | Admitting: Certified Registered Nurse Anesthetist

## 2022-11-10 ENCOUNTER — Ambulatory Visit (HOSPITAL_COMMUNITY): Payer: Medicare Other | Admitting: Certified Registered Nurse Anesthetist

## 2022-11-10 ENCOUNTER — Encounter (HOSPITAL_COMMUNITY): Payer: Self-pay | Admitting: Internal Medicine

## 2022-11-10 DIAGNOSIS — G4733 Obstructive sleep apnea (adult) (pediatric): Secondary | ICD-10-CM | POA: Diagnosis not present

## 2022-11-10 DIAGNOSIS — Z955 Presence of coronary angioplasty implant and graft: Secondary | ICD-10-CM | POA: Diagnosis not present

## 2022-11-10 DIAGNOSIS — Z7901 Long term (current) use of anticoagulants: Secondary | ICD-10-CM | POA: Diagnosis not present

## 2022-11-10 DIAGNOSIS — D6869 Other thrombophilia: Secondary | ICD-10-CM | POA: Insufficient documentation

## 2022-11-10 DIAGNOSIS — I4819 Other persistent atrial fibrillation: Secondary | ICD-10-CM | POA: Diagnosis present

## 2022-11-10 DIAGNOSIS — I4891 Unspecified atrial fibrillation: Secondary | ICD-10-CM | POA: Diagnosis not present

## 2022-11-10 DIAGNOSIS — Z87891 Personal history of nicotine dependence: Secondary | ICD-10-CM | POA: Insufficient documentation

## 2022-11-10 DIAGNOSIS — I10 Essential (primary) hypertension: Secondary | ICD-10-CM | POA: Diagnosis not present

## 2022-11-10 DIAGNOSIS — Z79899 Other long term (current) drug therapy: Secondary | ICD-10-CM | POA: Insufficient documentation

## 2022-11-10 DIAGNOSIS — I4892 Unspecified atrial flutter: Secondary | ICD-10-CM | POA: Diagnosis not present

## 2022-11-10 DIAGNOSIS — I2510 Atherosclerotic heart disease of native coronary artery without angina pectoris: Secondary | ICD-10-CM

## 2022-11-10 HISTORY — PX: CARDIOVERSION: SHX1299

## 2022-11-10 SURGERY — CARDIOVERSION
Anesthesia: General

## 2022-11-10 MED ORDER — LIDOCAINE 2% (20 MG/ML) 5 ML SYRINGE
INTRAMUSCULAR | Status: DC | PRN
  Administered 2022-11-10: 80 mg via INTRAVENOUS

## 2022-11-10 MED ORDER — SODIUM CHLORIDE 0.9 % IV SOLN: INTRAVENOUS | Status: DC

## 2022-11-10 MED ORDER — PROPOFOL 10 MG/ML IV BOLUS
INTRAVENOUS | Status: DC | PRN
  Administered 2022-11-10: 70 mg via INTRAVENOUS

## 2022-11-10 SURGICAL SUPPLY — 1 items: ELECT DEFIB PAD ADLT CADENCE (PAD) ×1 IMPLANT

## 2022-11-10 NOTE — Anesthesia Preprocedure Evaluation (Signed)
Anesthesia Evaluation  Patient identified by MRN, date of birth, ID band Patient awake    Reviewed: Allergy & Precautions, NPO status , Patient's Chart, lab work & pertinent test results  History of Anesthesia Complications Negative for: history of anesthetic complications  Airway Mallampati: II  TM Distance: >3 FB Neck ROM: Full    Dental  (+) Dental Advisory Given   Pulmonary sleep apnea and Continuous Positive Airway Pressure Ventilation , former smoker   breath sounds clear to auscultation       Cardiovascular hypertension, Pt. on medications (-) angina + CAD, + Cardiac Stents and + Peripheral Vascular Disease  + dysrhythmias Atrial Fibrillation  Rhythm:Irregular Rate:Normal  2020 ECHO: EF 55-60%. The LV has normal function. Normal left ventricular size.  Left ventricular septal wall thickness was normal. There is no LVH. normal RVF, mild MR, mild TR.     Neuro/Psych negative neurological ROS     GI/Hepatic negative GI ROS, Neg liver ROS,GERD  ,,  Endo/Other  negative endocrine ROS    Renal/GU negative Renal ROS Bladder dysfunction (bladder cancer)      Musculoskeletal   Abdominal   Peds  Hematology xarelto   Anesthesia Other Findings   Reproductive/Obstetrics                              Anesthesia Physical Anesthesia Plan  ASA: 3  Anesthesia Plan: General   Post-op Pain Management: Minimal or no pain anticipated   Induction: Intravenous  PONV Risk Score and Plan: 2 and Treatment may vary due to age or medical condition  Airway Management Planned: Natural Airway and Mask  Additional Equipment: None  Intra-op Plan:   Post-operative Plan:   Informed Consent: I have reviewed the patients History and Physical, chart, labs and discussed the procedure including the risks, benefits and alternatives for the proposed anesthesia with the patient or authorized representative who  has indicated his/her understanding and acceptance.     Dental advisory given  Plan Discussed with: CRNA and Surgeon  Anesthesia Plan Comments:          Anesthesia Quick Evaluation

## 2022-11-10 NOTE — Transfer of Care (Signed)
Immediate Anesthesia Transfer of Care Note  Patient: Damon [name]  Procedure(s) Performed: CARDIOVERSION  Patient Location: Cath Lab  Anesthesia Type:General  Level of Consciousness: drowsy and patient cooperative  Airway & Oxygen Therapy: Patient Spontanous Breathing and Patient connected to nasal cannula oxygen  Post-op Assessment: Report given to RN, Post -op Vital signs reviewed and stable, and Patient moving all extremities X 4  Post vital signs: Reviewed and stable  Last Vitals:  Vitals Value Taken Time  BP 115/69 11/10/22 1045  Temp    Pulse    Resp    SpO2 96 % 11/10/22 1046    Last Pain:  Vitals:   11/10/22 0949  TempSrc: Temporal  PainSc: 0-No pain         Complications: No notable events documented.

## 2022-11-10 NOTE — Discharge Instructions (Signed)

## 2022-11-10 NOTE — Anesthesia Postprocedure Evaluation (Signed)
Anesthesia Post Note  Patient: Damon [name]  Procedure(s) Performed: CARDIOVERSION     Patient location during evaluation: PACU Anesthesia Type: General Level of consciousness: awake and alert Pain management: pain level controlled Vital Signs Assessment: post-procedure vital signs reviewed and stable Respiratory status: spontaneous breathing, nonlabored ventilation, respiratory function stable and patient connected to nasal cannula oxygen Cardiovascular status: blood pressure returned to baseline and stable Postop Assessment: no apparent nausea or vomiting Anesthetic complications: no   No notable events documented.  Last Vitals:  Vitals:   11/10/22 1059 11/10/22 1109  BP: 109/77 111/76  Pulse: (!) 53 (!) 54  Resp: 14 15  Temp:    SpO2: 98% 99%    Last Pain:  Vitals:   11/10/22 1109  TempSrc:   PainSc: 0-No pain                 Nastassja Witkop

## 2022-11-10 NOTE — CV Procedure (Signed)
Procedure: Electrical Cardioversion Indications:  Atrial Fibrillation  Procedure Details:  Consent: Risks of procedure as well as the alternatives and risks of each were explained to the (patient/caregiver).  Consent for procedure obtained.  Time Out: Verified patient identification, verified procedure, site/side was marked, verified correct patient position, special equipment/implants available, medications/allergies/relevent history reviewed, required imaging and test results available. PERFORMED.  Patient placed on cardiac monitor, pulse oximetry, supplemental oxygen as necessary.  Sedation given:  propofol, see anesthesia notes Pacer pads placed anterior and posterior chest.  Cardioverted 1 time(s).  Cardioversion with synchronized biphasic 200J shock.  Evaluation: Findings: Post procedure EKG shows: NSR Complications: None Patient did tolerate procedure well.  Time Spent Directly with the Patient:  15 minutes   [name] 11/10/2022, 10:45 AM

## 2022-11-10 NOTE — Interval H&P Note (Signed)
History and Physical Interval Note:  11/10/2022 10:28 AM  Damon [name]  has presented today for surgery, with the diagnosis of afib.  The various methods of treatment have been discussed with the patient and family. After consideration of risks, benefits and other options for treatment, the patient has consented to  Procedure(s): CARDIOVERSION (N/A) as a surgical intervention.  The patient's history has been reviewed, patient examined, no change in status, stable for surgery.  I have reviewed the patient's chart and labs.  Questions were answered to the patient's satisfaction.     [name]
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
## 2022-11-25 ENCOUNTER — Ambulatory Visit (HOSPITAL_COMMUNITY)
Admission: RE | Admit: 2022-11-25 | Discharge: 2022-11-25 | Disposition: A | Discharge status: Home / Self Care | Payer: Medicare Other | Source: Ambulatory Visit | Attending: Physician Assistant | Admitting: Physician Assistant

## 2022-11-25 VITALS — BP 140/80 | HR 55 | Ht 69.0 in | Wt 206.0 lb

## 2022-11-25 DIAGNOSIS — R001 Bradycardia, unspecified: Secondary | ICD-10-CM | POA: Diagnosis not present

## 2022-11-25 DIAGNOSIS — Z87891 Personal history of nicotine dependence: Secondary | ICD-10-CM | POA: Insufficient documentation

## 2022-11-25 DIAGNOSIS — Z79899 Other long term (current) drug therapy: Secondary | ICD-10-CM | POA: Diagnosis not present

## 2022-11-25 DIAGNOSIS — G4733 Obstructive sleep apnea (adult) (pediatric): Secondary | ICD-10-CM | POA: Diagnosis not present

## 2022-11-25 DIAGNOSIS — D6869 Other thrombophilia: Secondary | ICD-10-CM | POA: Insufficient documentation

## 2022-11-25 DIAGNOSIS — I4892 Unspecified atrial flutter: Secondary | ICD-10-CM | POA: Insufficient documentation

## 2022-11-25 DIAGNOSIS — I4819 Other persistent atrial fibrillation: Secondary | ICD-10-CM | POA: Insufficient documentation

## 2022-11-25 DIAGNOSIS — I2510 Atherosclerotic heart disease of native coronary artery without angina pectoris: Secondary | ICD-10-CM | POA: Insufficient documentation

## 2022-11-25 DIAGNOSIS — Z7901 Long term (current) use of anticoagulants: Secondary | ICD-10-CM | POA: Diagnosis not present

## 2022-11-25 DIAGNOSIS — I10 Essential (primary) hypertension: Secondary | ICD-10-CM | POA: Insufficient documentation

## 2022-12-03 ENCOUNTER — Other Ambulatory Visit: Payer: Self-pay | Admitting: Internal Medicine

## 2022-12-03 DIAGNOSIS — I2510 Atherosclerotic heart disease of native coronary artery without angina pectoris: Secondary | ICD-10-CM

## 2022-12-03 DIAGNOSIS — I10 Essential (primary) hypertension: Secondary | ICD-10-CM

## 2022-12-25 ENCOUNTER — Ambulatory Visit: Payer: Medicare Other | Admitting: Nurse Practitioner

## 2023-01-09 ENCOUNTER — Encounter: Payer: Self-pay | Admitting: Cardiology

## 2023-01-09 ENCOUNTER — Ambulatory Visit: Payer: Medicare Other | Attending: Cardiology | Admitting: Cardiology

## 2023-01-09 VITALS — BP 124/86 | HR 57 | Ht 69.0 in | Wt 208.0 lb

## 2023-01-09 DIAGNOSIS — I2510 Atherosclerotic heart disease of native coronary artery without angina pectoris: Secondary | ICD-10-CM

## 2023-01-09 DIAGNOSIS — I4819 Other persistent atrial fibrillation: Secondary | ICD-10-CM | POA: Diagnosis not present

## 2023-01-09 DIAGNOSIS — D6869 Other thrombophilia: Secondary | ICD-10-CM | POA: Diagnosis not present

## 2023-01-09 DIAGNOSIS — R9389 Abnormal findings on diagnostic imaging of other specified body structures: Secondary | ICD-10-CM

## 2023-01-09 DIAGNOSIS — G4733 Obstructive sleep apnea (adult) (pediatric): Secondary | ICD-10-CM

## 2023-01-09 DIAGNOSIS — R931 Abnormal findings on diagnostic imaging of heart and coronary circulation: Secondary | ICD-10-CM

## 2023-01-09 DIAGNOSIS — I10 Essential (primary) hypertension: Secondary | ICD-10-CM

## 2023-01-09 NOTE — Addendum Note (Signed)
Addended by: Baird Lyons on: 01/09/2023 09:36 AM   Modules accepted: Orders

## 2023-01-09 NOTE — Progress Notes (Signed)
  Electrophysiology Office Note:   Date:  01/09/2023  ID:  Damon [name], DOB [birth year], MRN [id number]  Primary Cardiologist: Verne Carrow, MD Electrophysiologist: Tajah Noguchi Jorja Loa, MD      History of Present Illness:   Damon [name] is a 77 y.o. male with h/o atrial fibrillation seen today for routine electrophysiology followup.  Since last being seen in our clinic the patient reports doing well.  He did require cardioversion after ablation.  Despite that, he has had no further episodes since his cardioversion.  He is feeling well and is able to do all of his daily activities without restriction..  he denies chest pain, palpitations, dyspnea, PND, orthopnea, nausea, vomiting, dizziness, syncope, edema, weight gain, or early satiety.     He has a history of atrial fibrillation, hyperlipidemia, sleep apnea, PAD, coronary artery disease, atrial fibrillation.  He is post ablation with PVI in 2014 and posterior wall isolation in 2020.  He had more frequent episodes of atrial fibrillation and is now post repeat ablation 10/09/2022.  He was found to have conduction in the left upper pulmonary vein which was ablated at that time.      Review of systems complete and found to be negative unless listed in HPI.   Studies Reviewed:    EKG is ordered today. Personal review shows this rhythm with PACs    Risk Assessment/Calculations:    CHA2DS2-VASc Score = 4   This indicates a 4.8% annual risk of stroke. The patient's score is based upon: CHF History: 0 HTN History: 1 Diabetes History: 0 Stroke History: 0 Vascular Disease History: 1 Age Score: 2 Gender Score: 0             Physical Exam:   VS:  BP 124/86   Pulse (!) 57   Ht 5\' 9"  (1.753 m)   Wt 208 lb (94.3 kg)   SpO2 97%   BMI 30.72 kg/m    Wt Readings from Last 3 Encounters:  01/09/23 208 lb (94.3 kg)  11/25/22 206 lb (93.4 kg)  11/10/22 200 lb (90.7 kg)     GEN: Well nourished, well developed in no acute  distress NECK: No JVD; No carotid bruits CARDIAC: Regular rate and rhythm, no murmurs, rubs, gallops RESPIRATORY:  Clear to auscultation without rales, wheezing or rhonchi  ABDOMEN: Soft, non-tender, non-distended EXTREMITIES:  No edema; No deformity   ASSESSMENT AND PLAN:    1.  Persistent atrial fibrillation/flutter: Currently on diltiazem and Xarelto.  He is status post multiple ablations, most recently 10/09/2022.  Did require cardioversion post ablation, but has remained in sinus rhythm since then.  Damon [name] continue with current management.  2.  Secondary hypercoagulable state: Currently on Xarelto for atrial fibrillation  3.  Hypertension: Currently well-controlled  4.  Obstructive sleep apnea: CPAP compliance encouraged  5.  Coronary artery disease: Has had a stent in the past.  Has a severely elevated calcium score.  Patient is concerned about this, though he has minimal symptoms.  Damon [name] plan for cardiac PET.  Follow up with EP APP in 6 months  Signed, Clemon Devaul Jorja Loa, MD

## 2023-01-09 NOTE — Addendum Note (Signed)
Addended by: Okey Regal on: 01/09/2023 09:39 AM   Modules accepted: Orders

## 2023-01-09 NOTE — Patient Instructions (Addendum)
Medication Instructions:  Your physician recommends that you continue on your current medications as directed. Please refer to the Current Medication list given to you today.  *If you need a refill on your cardiac medications before your next appointment, please call your pharmacy*   Lab Work: None ordered If you have labs (blood work) drawn today and your tests are completely normal, you will receive your results only by: MyChart Message (if you have MyChart) OR A paper copy in the mail If you have any lab test that is abnormal or we need to change your treatment, we will call you to review the results.   Testing/Procedures: Your physician has recommended you have a cardiac PET scan.  The office will obtain a precert from your insurance and will then call you to schedule this testing.   Follow-Up: At [facility], you and your health needs are our priority.  As part of our continuing mission to provide you with exceptional heart care, we have created designated Provider Care Teams.  These Care Teams include your primary Cardiologist (physician) and Advanced Practice Providers (APPs -  Physician Assistants and Nurse Practitioners) who all work together to provide you with the care you need, when you need it.  Your next appointment:   6 month(s)  The format for your next appointment:   In Person  Provider:   You will see one of the following Advanced Practice Providers on your designated Care Team:   Francis Dowse, [place] "[address], [place] Roslynn Amble, NP  Thank you for choosing [facility]!!   Dory Horn, RN [phone]  Other Instructions    How to Prepare for Your Cardiac PET/CT Stress Test:  1. Please do not take these medications before your test:   Medications that may interfere with the cardiac pharmacological stress agent (ex. nitrates - including erectile dysfunction medications, isosorbide mononitrate, tamulosin or beta-blockers) the day of  the exam. (Erectile dysfunction medication should be held for at least 72 hrs prior to test) Theophylline containing medications for 12 hours. Dipyridamole 48 hours prior to the test. Your remaining medications may be taken with water.  2. Nothing to eat or drink, except water, 3 hours prior to arrival time.   NO caffeine/decaffeinated products, or chocolate 12 hours prior to arrival.  3. NO perfume, cologne or lotion  4. Total time is 1 to 2 hours; you may want to bring reading material for the waiting time.  5. Please report to Radiology at the [facility] Main Entrance 30 minutes early for your test.  [address] [place], [place] [address]  Diabetic Preparation:  Hold oral medications. You may take NPH and Lantus insulin. Do not take Humalog or Humulin R (Regular Insulin) the day of your test. Check blood sugars prior to leaving the house. If able to eat breakfast prior to 3 hour fasting, you may take all medications, including your insulin, Do not worry if you miss your breakfast dose of insulin - start at your next meal.  IF YOU THINK YOU MAY BE PREGNANT, OR ARE NURSING PLEASE INFORM THE TECHNOLOGIST.  In preparation for your appointment, medication and supplies will be purchased.  Appointment availability is limited, so if you need to cancel or reschedule, please call the Radiology Department at [phone]  24 hours in advance to avoid a cancellation fee of $100.00  What to Expect After you Arrive:  Once you arrive and check in for your appointment, you will be taken  to a preparation room within the Radiology Department.  A technologist or Nurse will obtain your medical history, verify that you are correctly prepped for the exam, and explain the procedure.  Afterwards,  an IV will be started in your arm and electrodes will be placed on your skin for EKG monitoring during the stress portion of the exam. Then you will be escorted to the PET/CT scanner.  There, staff  will get you positioned on the scanner and obtain a blood pressure and EKG.  During the exam, you will continue to be connected to the EKG and blood pressure machines.  A small, safe amount of a radioactive tracer will be injected in your IV to obtain a series of pictures of your heart along with an injection of a stress agent.    After your Exam:  It is recommended that you eat a meal and drink a caffeinated beverage to counter act any effects of the stress agent.  Drink plenty of fluids for the remainder of the day and urinate frequently for the first couple of hours after the exam.  Your doctor will inform you of your test results within 7-10 business days.  For questions about your test or how to prepare for your test, please call: Rockwell Alexandria, Cardiac Imaging Nurse Navigator  Larey Brick, Cardiac Imaging Nurse Navigator Office: [phone]

## 2023-01-13 ENCOUNTER — Ambulatory Visit (INDEPENDENT_AMBULATORY_CARE_PROVIDER_SITE_OTHER): Payer: Medicare Other | Admitting: Nurse Practitioner

## 2023-01-13 ENCOUNTER — Encounter: Payer: Self-pay | Admitting: Nurse Practitioner

## 2023-01-13 VITALS — BP 118/72 | HR 60 | Temp 97.6°F | Resp 18 | Wt 207.2 lb

## 2023-01-13 DIAGNOSIS — Z0001 Encounter for general adult medical examination with abnormal findings: Secondary | ICD-10-CM

## 2023-01-13 DIAGNOSIS — G4700 Insomnia, unspecified: Secondary | ICD-10-CM

## 2023-01-13 DIAGNOSIS — E782 Mixed hyperlipidemia: Secondary | ICD-10-CM

## 2023-01-13 DIAGNOSIS — I480 Paroxysmal atrial fibrillation: Secondary | ICD-10-CM

## 2023-01-13 DIAGNOSIS — Z0000 Encounter for general adult medical examination without abnormal findings: Secondary | ICD-10-CM

## 2023-01-13 DIAGNOSIS — K219 Gastro-esophageal reflux disease without esophagitis: Secondary | ICD-10-CM

## 2023-01-13 DIAGNOSIS — E663 Overweight: Secondary | ICD-10-CM

## 2023-01-13 DIAGNOSIS — G4733 Obstructive sleep apnea (adult) (pediatric): Secondary | ICD-10-CM

## 2023-01-13 DIAGNOSIS — I739 Peripheral vascular disease, unspecified: Secondary | ICD-10-CM | POA: Diagnosis not present

## 2023-01-13 DIAGNOSIS — I2510 Atherosclerotic heart disease of native coronary artery without angina pectoris: Secondary | ICD-10-CM

## 2023-01-13 DIAGNOSIS — R7309 Other abnormal glucose: Secondary | ICD-10-CM

## 2023-01-13 DIAGNOSIS — R6889 Other general symptoms and signs: Secondary | ICD-10-CM

## 2023-01-13 DIAGNOSIS — C679 Malignant neoplasm of bladder, unspecified: Secondary | ICD-10-CM

## 2023-01-13 DIAGNOSIS — I10 Essential (primary) hypertension: Secondary | ICD-10-CM

## 2023-01-13 DIAGNOSIS — E559 Vitamin D deficiency, unspecified: Secondary | ICD-10-CM

## 2023-01-13 DIAGNOSIS — H9193 Unspecified hearing loss, bilateral: Secondary | ICD-10-CM

## 2023-01-13 DIAGNOSIS — Z79899 Other long term (current) drug therapy: Secondary | ICD-10-CM

## 2023-01-13 DIAGNOSIS — Z86010 Personal history of colonic polyps: Secondary | ICD-10-CM

## 2023-01-13 LAB — CBC WITH DIFFERENTIAL/PLATELET
Basophils Absolute: 48 cells/uL (ref 0–200)
Eosinophils Absolute: 231 cells/uL (ref 15–500)
Eosinophils Relative: 3.4 %
Hemoglobin: 15.2 g/dL (ref 13.2–17.1)
Lymphs Abs: 1523 cells/uL (ref 850–3900)
MCH: 29.9 pg (ref 27.0–33.0)
Monocytes Relative: 9.2 %
Platelets: 202 10*3/uL (ref 140–400)
RDW: 12.8 % (ref 11.0–15.0)
WBC: 6.8 10*3/uL (ref 3.8–10.8)

## 2023-01-13 NOTE — Progress Notes (Signed)
MEDICARE ANNUAL WELLNESS VISIT AND OV  Assessment:   Annual Medicare Wellness Visit Due annually  Health maintenance reviewed  - declines covid 19 booster and shingrix at this time, vaccines have triggered a. Fib in the past  Atherosclerosis of aorta (HCC) - Per CT 2015 Control blood pressure, cholesterol, glucose, increase exercise.   Essential hypertension Discussed DASH (Dietary Approaches to Stop Hypertension) DASH diet is lower in sodium than a typical American diet. Cut back on foods that are high in saturated fat, cholesterol, and trans fats. Eat more whole-grain foods, fish, poultry, and nuts Remain active and exercise as tolerated daily.  Monitor BP at home-Call if greater than 130/80.  Check CMP/CBC   Paroxysmal atrial fibrillation (HCC) Rate controlled; numerous CVs and several ablations Continue follow up cardio Continue xarelto  CAD Control blood pressure, cholesterol (LDL <70), glucose, increase exercise.  Followed by cardiology No recent chest pain/nitroglycerine use   Peripheral arterial disease (HCC) No claudication sx, cardiology follows Control blood pressure, cholesterol, glucose, increase exercise.    Hyperlipidemia Discussed lifestyle modifications. Recommended diet heavy in fruits and veggies, omega 3's. Decrease consumption of animal meats, cheeses, and dairy products. Remain active and exercise as tolerated. Continue to monitor. Check lipids/TSH  Other abnormal glucose Education: Reviewed 'ABCs' of diabetes management  Discussed goals to be met and/or maintained include A1C (<7) Blood pressure (<130/80) Cholesterol (LDL <70) Continue Eye Exam yearly  Continue Dental Exam Q6 mo Discussed dietary recommendations Discussed Physical Activity recommendations Check A1C  Vitamin D deficiency Continue supplement for goal of 60-100 Monitor Vitamin D levels   Malignant neoplasm of urinary bladder, unspecified site [facility]) Continue close  monitoring by urology  OSA on CPAP Sleep apnea- continue CPAP, weight loss advised.    Medication management All medications discussed and reviewed in full. All questions and concerns regarding medications addressed.     Mitral regurgitation Mild last ECHO; Continue cardio follow up  Gastroesophageal reflux disease, esophagitis presence not specified Continue Prevacid PRN. No suspected reflux complications (Barret/stricture). Lifestyle modification:  wt loss, avoid meals 2-3h before bedtime. Consider eliminating food triggers:  chocolate, caffeine, EtOH, acid/spicy food.  Obesity - BMI 30 Discussed appropriate BMI Diet modification. Physical activity. Encouraged/praised to build confidence.  Insomnia Discussed good sleep hygiene. Establish bed and wake times. Sleep restriction-only sleep estimated hrs sleep. Bed only for sex and sleep, only sleep when sleepy, out of bed if anxious (stimulus control). Reviewed relaxation techniques, mindful meditations. Expected sleep duration. Addressed worries about not sleeping.   History of colon polyps Colonoscopy UTD 07/2022 Continue to monitor  Bilateral hearing loss High frequency only, doesn't want hearing aids at this time   Orders Placed This Encounter  Procedures   CBC with Differential/Platelet   COMPLETE METABOLIC PANEL WITH GFR   Lipid panel   VITAMIN D 25 Hydroxy (Vit-D Deficiency, Fractures)    Notify office for further evaluation and treatment, questions or concerns if any reported s/s fail to improve.   The patient was advised to call back or seek an in-person evaluation if any symptoms worsen or if the condition fails to improve as anticipated.   Further disposition pending results of labs. Discussed med's effects and SE's.    I discussed the assessment and treatment plan with the patient. The patient was provided an opportunity to ask questions and all were answered. The patient agreed with the plan and  demonstrated an understanding of the instructions.  Discussed med's effects and SE's. Screening labs and tests as requested  with regular follow-up as recommended.  I provided 35 minutes of face-to-face time during this encounter including counseling, chart review, and critical decision making was preformed.  Today's Plan of Care is based on a patient-centered health care approach known as shared decision making - the decisions, tests and treatments allow for patient preferences and values to be balanced with clinical evidence.    Future Appointments  Date Time Provider Department Center  04/16/2023 10:30 AM [name], MD [facility] None  10/12/2023  3:00 PM [name], MD [facility] None  01/14/2024  2:30 PM Adela Glimpse, NP [facility] None     Plan:   During the course of the visit the patient was educated and counseled about appropriate screening and preventive services including:   Pneumococcal vaccine  Influenza vaccine Td vaccine Screening electrocardiogram Bone densitometry screening Colorectal cancer screening Diabetes screening Glaucoma screening Nutrition counseling  Advanced directives: requested  Subjective:   Damon [name]  presents for Medicare Annual Wellness Visit and 3 month follow up for Hypertension, Hyperlipidemia, Pre-Diabetes and Vitamin D Deficiency. He has Essential hypertension; Mitral regurgitation; Hyperlipidemia, mixed; Abnormal glucose; Vitamin D deficiency; CAD- CFX PTCA 11/24/13 ; Bladder cancer (HCC); Gastroesophageal reflux disease; PAD (peripheral artery disease) (HCC); OSA on CPAP; History of colon polyps; Hearing loss of both ears; Overweight (BMI 25.0-29.9); Insomnia; Paroxysmal atrial fibrillation (HCC); Aortic atherosclerosis (HCC) by Abd CT Scan in 2015; Persistent atrial fibrillation (HCC); and Hypercoagulable state due to persistent atrial fibrillation (HCC) on their problem list.  Overall he reports doing well today.     Patient was taking xanax for insomnia but was able to taper off, reports is sleeping better since getting off of the medication.   In Sept 2014 , Patient had TUR of a Bladder cancer by Dr Berneice Heinrich, follows annually for bladder scope without recurrence.   He has OSA and is on a CPAP, 100% compliance, reports restorative sleep -   BMI is Body mass index is 30.6 kg/m., he has been working on diet and exercise; typically golfs once a week, does do yardwork several hours Wt Readings from Last 3 Encounters:  01/13/23 207 lb 3.2 oz (94 kg)  01/09/23 208 lb (94.3 kg)  11/25/22 206 lb (93.4 kg)   Patient has HTN. Today's BP: 118/72.   Patient has ASCAD  S/p PCA/stenting followed by Rotator Ablation in 2015 (Dr Shirlee Latch).   Patient has had persistent recurrent Afib (on Xarelto).  He has had 8 CV and 3 ablations, most recently had after colonoscopy in 09/2021. Last ablation 10/2022. Mild MR per ECHO 09/03/2018.   He has PAD, denies recent symptoms, improved with increased exercise. Aortic atherosclerosis per CT 2015. Patient has had no complaints of any cardiac type chest pain, palpitations, dyspnea/orthopnea/PND, dizziness, claudication, or dependent edema.  Hyperlipidemia is controlled with diet & meds, he is on crestor 20 mg and zetia 10 mg for LDL <70. Patient denies myalgias or other med SE's. Last Lipids were at goal -   Lab Results  Component Value Date   CHOL 132 10/07/2022   HDL 61 10/07/2022   LDLCALC 55 10/07/2022   TRIG 75 10/07/2022   CHOLHDL 2.2 10/07/2022   Also, the patient has history of PreDiabetes with A1c 5.7% in 2011 and 5.9% in 2013, and then 5.6% x 2 in late 2013 now controlled by diet.  He's had no symptoms of reactive hypoglycemia, diabetic polys, paresthesias or visual blurring.   Lab Results  Component Value Date   HGBA1C 5.4 10/07/2022  Last GFR:  Lab Results  Component Value Date   EGFR 87 10/07/2022   Further, the patient also has history of Vitamin D Deficiency  of 27 in 2008 and supplements vitamin D without any suspected side-effects. Lab Results  Component Value Date   VD25OH 101 (H) 10/07/2022      Medication Review: Current Outpatient Medications on File Prior to Visit  Medication Sig   bisoprolol-hydrochlorothiazide (ZIAC) 10-6.25 MG tablet Take 0.5 tablets by mouth daily.   Cholecalciferol (VITAMIN D3) 125 MCG (5000 UT) CAPS Take 10,000 units Daily   diltiazem (CARDIZEM CD) 180 MG 24 hr capsule TAKE 1 CAPSULE BY MOUTH DAILY   diltiazem (CARDIZEM) 30 MG tablet Take 1 tablet every 4 hours AS NEEDED for AFIB heart rate >100 as long as top BP >100.   ezetimibe (ZETIA) 10 MG tablet Take  1 tablet  Daily  for Cholesterol                                                                         /                                      TAKE                         BY                               MOUTH   Multiple Vitamin (MULTIVITAMIN WITH MINERALS) TABS tablet Take 1 tablet Daily   nitroGLYCERIN (NITROSTAT) 0.4 MG SL tablet DISSOLVE ONE TABLET UNDER THE TONGUE EVERY 5 MINUTES AS NEEDED FOR CHEST PAIN.DO NOT EXCEED A TOTAL OF 3 DOSES IN 15 MINUTES   Polyvinyl Alcohol-Povidone (REFRESH OP) Place 1 drop into both eyes 3 (three) times daily as needed (dry/irritated eyes.).   rosuvastatin (CRESTOR) 40 MG tablet TAKE ONE-HALF TABLET BY MOUTH  DAILY FOR CHOLESTEROL   XARELTO 20 MG TABS tablet TAKE 1 TABLET BY MOUTH DAILY  WITH SUPPER   pantoprazole (PROTONIX) 40 MG tablet Take 1 tablet (40 mg total) by mouth daily. (Patient not taking: Reported on 01/09/2023)   No current facility-administered medications on file prior to visit.    Current Problems (verified) Patient Active Problem List   Diagnosis Date Noted   Persistent atrial fibrillation (HCC) 07/30/2021   Hypercoagulable state due to persistent atrial fibrillation (HCC) 07/30/2021   Aortic atherosclerosis (HCC) by Abd CT Scan in 2015 05/16/2020   Paroxysmal atrial fibrillation (HCC)    Insomnia  03/23/2018   Overweight (BMI 25.0-29.9) 08/17/2017   PAD (peripheral artery disease) (HCC)    OSA on CPAP    History of colon polyps    Hearing loss of both ears    Bladder cancer (HCC) 02/16/2015   Gastroesophageal reflux disease 02/16/2015   CAD- CFX PTCA 11/24/13  11/25/2013   Hyperlipidemia, mixed 06/22/2013   Abnormal glucose 06/22/2013   Vitamin D deficiency 06/22/2013   Essential hypertension    Mitral regurgitation     Screening Tests Immunization History  Administered Date(s) Administered  DT (Pediatric) 07/22/2003, 12/28/2013   Influenza Split 04/14/2019   Influenza Whole 05/19/2013   Influenza, High Dose Seasonal PF 04/08/2017, 05/17/2018, 04/14/2019, 05/28/2021, 04/22/2022   Influenza,inj,Quad PF,6+ Mos 05/09/2014   Influenza-Unspecified 05/14/2015, 05/01/2016, 05/17/2018   PFIZER(Purple Top)SARS-COV-2 Vaccination 08/18/2019, 09/15/2019   Pneumococcal Conjugate-13 07/11/2014   Pneumococcal Polysaccharide-23 12/04/2009, 06/10/2016   Tdap 09/22/2013   Health Maintenance  Topic Date Due   Zoster Vaccines- Shingrix (1 of 2) Never done   COVID-19 Vaccine (3 - Pfizer risk series) 10/13/2019   INFLUENZA VACCINE  02/19/2023   DTaP/Tdap/Td (4 - Td or Tdap) 12/29/2023   Medicare Annual Wellness (AWV)  01/13/2024   Colonoscopy  07/31/2025   Pneumonia Vaccine 67+ Years old  Completed   Hepatitis C Screening  Completed   HPV VACCINES  Aged Out   Shingrix: declines  Covid 19: 2/2, 2021, pfizer, declining booster at this time, ? 2nd shot triggered a. Fib.   Last colonoscopy: 07/2022  Names of Other Physician/Practitioners you currently use: 1. [facility] here for primary care 2. [facility], Dr. Nedra Hai, eye doctor, last visit 08/2021 3. ? Name of his dentist, dentist, last visit 2014, several years, no issues, encouraged routine screening and cleaning  Patient Care Team: [name], MD as PCP - General (Internal Medicine) Kathleene Hazel, MD as PCP - Cardiology (Cardiology) Regan Lemming, MD as PCP - Electrophysiology (Cardiology) Mittie Bodo, MD as Referring Physician (Cardiology) Hillis Range, MD (Inactive) as Consulting Physician (Cardiology) Berneice Heinrich Delbert Phenix., MD as Consulting Physician (Urology) Kathryne Hitch, MD as Consulting Physician (Orthopedic Surgery) Era Bumpers, MD as Referring Physician (Urology) Charolett Bumpers, MD as Consulting Physician (Gastroenterology)   Allergies No Known Allergies  SURGICAL HISTORY He  has a past surgical history that includes Total hip arthroplasty (07/25/2011); TEE without cardioversion (07/26/2012); Cardioversion (08/06/2012); Total hip arthroplasty (Left, 02/08/2010); Cataract extraction w/ intraocular lens  implant, bilateral; Cardiac electrophysiology study and ablation (07-27-2012  DR ALLRED); Tonsillectomy (age 71); Cystoscopy w/ retrogrades (Bilateral, 04/12/2013); Transurethral resection of bladder tumor with gyrus (turbt-gyrus) (N/A, 04/12/2013); atrial fibrillation ablation (N/A, 07/27/2012); left heart catheterization with coronary angiogram (N/A, 11/24/2013); percutaneous coronary stent intervention (pci-s) (11/24/2013); percutaneous coronary rotoblator intervention (pci-r) (N/A, 11/28/2013); TEE without cardioversion (N/A, 08/25/2014); Cardioversion (N/A, 08/25/2014); Cardioversion (N/A, 11/13/2014); Cardioversion (N/A, 12/28/2014); Cardiac catheterization (N/A, 01/26/2015); TEE without cardioversion (01/26/2015); Ablation of dysrhythmic focus (01/26/2015); Colonoscopy with propofol (N/A, 02/13/2015); Cardioversion (N/A, 01/30/2017); Cardioversion (N/A, 04/06/2017); Cardioversion (N/A, 05/25/2018); Cardioversion (N/A, 08/09/2018); ATRIAL FIBRILLATION ABLATION (N/A, 08/25/2018); TEE without cardioversion (N/A, 07/04/2019); Cardioversion (N/A, 07/04/2019); Cardioversion (N/A, 09/23/2019); Joint replacement; Cystoscopy with  retrograde pyelogram, ureteroscopy and stent placement (03/06/2021); Holmium laser application (Left, 03/06/2021); Cardioversion (N/A, 08/07/2021); Cardioversion (N/A, 03/17/2022); Cardioversion (N/A, 06/26/2022); ATRIAL FIBRILLATION ABLATION (N/A, 10/09/2022); and Cardioversion (N/A, 11/10/2022). FAMILY HISTORY His family history includes Heart attack in his mother; Heart disease in his father and mother; Heart failure in his father. SOCIAL HISTORY He  reports that he quit smoking about 41 years ago. His smoking use included cigarettes. He has a 20.00 pack-year smoking history. He has never used smokeless tobacco. He reports that he does not drink alcohol and does not use drugs.  MEDICARE WELLNESS OBJECTIVES: Physical activity: Current Exercise Habits: Home exercise routine Cardiac risk factors:   Depression/mood screen:      01/13/2023    3:40 PM  Depression screen PHQ 2/9  Decreased Interest 0  Down, Depressed, Hopeless 0  PHQ - 2 Score 0  ADLs:     01/13/2023    3:40 PM 11/10/2022    9:48 AM  In your present state of health, do you have any difficulty performing the following activities:  Hearing? 1 1  Vision? 0 0  Difficulty concentrating or making decisions? 0 0  Walking or climbing stairs? 0 0  Dressing or bathing? 0 0  Doing errands, shopping? 0      Cognitive Testing  Alert? Yes  Normal Appearance?Yes  Oriented to person? Yes  Place? Yes   Time? Yes  Recall of three objects?  Yes  Can perform simple calculations? Yes  Displays appropriate judgment?Yes  Can read the correct time from a watch face?Yes  EOL planning:      Objective:     Blood pressure 118/72, pulse 60, temperature 97.6 F (36.4 C), resp. rate 18, weight 207 lb 3.2 oz (94 kg), SpO2 98 %.   General Appearance:  Alert  WD/WN, male  in no apparent distress. Eyes: PERRLA, EOMs nl, conjunctiva normal Sinuses: No frontal/maxillary tenderness ENT/Mouth: EACs patent / TMs  nl. Nares clear without erythema,  swelling, mucoid exudates. Oral hygiene is good. No erythema, swelling, or exudate. Tongue normal, non-obstructing. Tonsils not swollen or erythematous. Not notably HOH today with mask on.  Neck: Supple, thyroid normal. No bruits, nodes or JVD. Respiratory: Respiratory effort normal.  BS equal and clear bilateral without rales, rhonci, wheezing or stridor. Cardio: Heart sounds are normal with regular rate and rhythm and no murmurs, rubs or gallops. Peripheral pulses are normal and equal bilaterally without edema. No aortic or femoral bruits. Chest: symmetric with normal excursions and percussion.  Abdomen: Flat, soft, with nl bowel sounds. No guarding, rebound, hernias, masses, or organomegaly. Mild suprapubic tenderness Lymphatics: Non tender without lymphadenopathy.  Musculoskeletal: Full ROM all peripheral extremities, joint stability, 5/5 strength, and normal gait. Patient is able to ambulate well. Gait is not  Antalgic. He has left heel pain, over fat pad area, no anterior heel tenderness.  Neuro: Cranial nerves intact, reflexes equal bilaterally. Normal muscle tone, no cerebellar symptoms. Sensation intact.  Pysch: Alert and oriented X 3 with normal affect, insight and judgment appropriate.    Medicare Attestation I have personally reviewed: The patient's medical and social history Their use of alcohol, tobacco or illicit drugs Their current medications and supplements The patient's functional ability including ADLs,fall risks, home safety risks, cognitive, and hearing and visual impairment Diet and physical activities Evidence for depression or mood disorders  The patient's weight, height, BMI, and visual acuity have been recorded in the chart.  I have made referrals, counseling, and provided education to the patient based on review of the above and I have provided the patient with a written personalized care plan for preventive services.  Over 40 minutes of exam, counseling, chart review  was performed.  Adela Glimpse, NP   01/13/2023

## 2023-01-14 LAB — CBC WITH DIFFERENTIAL/PLATELET
Absolute Monocytes: 626 cells/uL (ref 200–950)
Basophils Relative: 0.7 %
HCT: 45.2 % (ref 38.5–50.0)
MCHC: 33.6 g/dL (ref 32.0–36.0)
MCV: 89 fL (ref 80.0–100.0)
MPV: 9.7 fL (ref 7.5–12.5)
Neutro Abs: 4372 cells/uL (ref 1500–7800)
Neutrophils Relative %: 64.3 %
RBC: 5.08 10*6/uL (ref 4.20–5.80)
Total Lymphocyte: 22.4 %

## 2023-01-14 LAB — COMPLETE METABOLIC PANEL WITH GFR
AG Ratio: 2 (calc) (ref 1.0–2.5)
ALT: 23 U/L (ref 9–46)
AST: 21 U/L (ref 10–35)
Albumin: 4.3 g/dL (ref 3.6–5.1)
Alkaline phosphatase (APISO): 52 U/L (ref 35–144)
BUN: 15 mg/dL (ref 7–25)
CO2: 26 mmol/L (ref 20–32)
Calcium: 9.4 mg/dL (ref 8.6–10.3)
Chloride: 107 mmol/L (ref 98–110)
Creat: 0.82 mg/dL (ref 0.70–1.28)
Globulin: 2.2 g/dL (calc) (ref 1.9–3.7)
Glucose, Bld: 90 mg/dL (ref 65–99)
Potassium: 4 mmol/L (ref 3.5–5.3)
Sodium: 142 mmol/L (ref 135–146)
Total Bilirubin: 2.2 mg/dL — ABNORMAL HIGH (ref 0.2–1.2)
Total Protein: 6.5 g/dL (ref 6.1–8.1)
eGFR: 90 mL/min/{1.73_m2} (ref >=60)

## 2023-01-14 LAB — LIPID PANEL
Cholesterol: 134 mg/dL (ref <200)
HDL: 62 mg/dL (ref >=40)
LDL Cholesterol (Calc): 54 mg/dL (calc)
Non-HDL Cholesterol (Calc): 72 mg/dL (calc) (ref <130)
Total CHOL/HDL Ratio: 2.2 (calc) (ref <5.0)
Triglycerides: 93 mg/dL (ref <150)

## 2023-01-14 LAB — VITAMIN D 25 HYDROXY (VIT D DEFICIENCY, FRACTURES): Vit D, 25-Hydroxy: 89 ng/mL (ref 30–100)

## 2023-03-01 ENCOUNTER — Other Ambulatory Visit: Payer: Self-pay | Admitting: Internal Medicine

## 2023-03-01 DIAGNOSIS — E782 Mixed hyperlipidemia: Secondary | ICD-10-CM

## 2023-03-01 DIAGNOSIS — I2510 Atherosclerotic heart disease of native coronary artery without angina pectoris: Secondary | ICD-10-CM

## 2023-03-03 ENCOUNTER — Telehealth (HOSPITAL_COMMUNITY): Payer: Self-pay | Admitting: Physician Assistant

## 2023-03-03 NOTE — Telephone Encounter (Signed)
Patient called stating he was back in afib, offered him a appointment Thursday 03/05/23, he declined stating he would see if he could be seen sooner somewhere else.

## 2023-03-03 NOTE — Telephone Encounter (Signed)
Patient called and stated he was back in afib, offered him an appointment for Thursday 03/05/23 and he stated that he would see if he could be seen somewhere else sooner.

## 2023-03-04 ENCOUNTER — Telehealth (HOSPITAL_COMMUNITY): Payer: Self-pay | Admitting: *Deleted

## 2023-03-04 ENCOUNTER — Telehealth (HOSPITAL_COMMUNITY): Payer: Self-pay | Admitting: Emergency Medicine

## 2023-03-04 NOTE — Telephone Encounter (Signed)
Attempted to call patient regarding upcoming cardiac PET appointment. Left message on voicemail with name and callback number Sara Wallace RN Navigator Cardiac Imaging [facility] [phone] Office [phone] Cell  

## 2023-03-04 NOTE — Telephone Encounter (Signed)
Patient returning call about his upcoming cardiac imaging study; pt verbalizes understanding of appt date/time, parking situation and where to check in, pre-test NPO status and verified current allergies; name and call back number provided for further questions should they arise  Larey Brick RN Navigator Cardiac Imaging [facility] [phone] office [phone] cell  Patient aware to hold caffeine 12 hours prior to his cardiac PET scan.

## 2023-03-05 ENCOUNTER — Ambulatory Visit
Admission: RE | Admit: 2023-03-05 | Discharge: 2023-03-05 | Disposition: A | Discharge status: Home / Self Care | Payer: Medicare Other | Source: Ambulatory Visit | Attending: Cardiology | Admitting: Cardiology

## 2023-03-05 ENCOUNTER — Ambulatory Visit: Payer: Medicare Other | Admitting: Anesthesiology

## 2023-03-05 ENCOUNTER — Other Ambulatory Visit: Payer: Self-pay

## 2023-03-05 ENCOUNTER — Encounter
Admission: RE | Disposition: A | Discharge status: Home / Self Care | Payer: Self-pay | Source: Home / Self Care | Attending: Internal Medicine

## 2023-03-05 ENCOUNTER — Ambulatory Visit
Admission: RE | Admit: 2023-03-05 | Discharge: 2023-03-05 | Disposition: A | Discharge status: Home / Self Care | Payer: Medicare Other | Attending: Internal Medicine | Admitting: Internal Medicine

## 2023-03-05 ENCOUNTER — Encounter: Payer: Self-pay | Admitting: Internal Medicine

## 2023-03-05 DIAGNOSIS — I480 Paroxysmal atrial fibrillation: Secondary | ICD-10-CM | POA: Insufficient documentation

## 2023-03-05 DIAGNOSIS — R931 Abnormal findings on diagnostic imaging of heart and coronary circulation: Secondary | ICD-10-CM | POA: Diagnosis not present

## 2023-03-05 DIAGNOSIS — G4733 Obstructive sleep apnea (adult) (pediatric): Secondary | ICD-10-CM | POA: Diagnosis not present

## 2023-03-05 DIAGNOSIS — I2510 Atherosclerotic heart disease of native coronary artery without angina pectoris: Secondary | ICD-10-CM

## 2023-03-05 DIAGNOSIS — E785 Hyperlipidemia, unspecified: Secondary | ICD-10-CM | POA: Insufficient documentation

## 2023-03-05 DIAGNOSIS — I340 Nonrheumatic mitral (valve) insufficiency: Secondary | ICD-10-CM | POA: Diagnosis not present

## 2023-03-05 DIAGNOSIS — I10 Essential (primary) hypertension: Secondary | ICD-10-CM | POA: Diagnosis not present

## 2023-03-05 DIAGNOSIS — I4891 Unspecified atrial fibrillation: Secondary | ICD-10-CM | POA: Diagnosis present

## 2023-03-05 DIAGNOSIS — Z87891 Personal history of nicotine dependence: Secondary | ICD-10-CM | POA: Diagnosis not present

## 2023-03-05 HISTORY — PX: CARDIOVERSION: SHX1299

## 2023-03-05 LAB — NM PET CT CARDIAC PERFUSION MULTI W/ABSOLUTE BLOODFLOW
LV dias vol: 106 mL (ref 62–150)
MBFR: 2.93
Nuc Rest EF: 51 %
Nuc Stress EF: 56 %
Peak HR: 76 {beats}/min
Rest HR: 63 {beats}/min
Rest MBF: 0.7 ml/g/min
Rest Nuclear Isotope Dose: 24.3 mCi
Rest perfusion cavity size (mL): 106 mL
SRS: 2
SSS: 2
ST Depression (mm): 0 mm
Stress MBF: 2.05 ml/g/min
Stress Nuclear Isotope Dose: 24.3 mCi
Stress perfusion cavity size (mL): 121 mL
TID: 1.13

## 2023-03-05 SURGERY — CARDIOVERSION
Anesthesia: General

## 2023-03-05 MED ORDER — RUBIDIUM RB82 GENERATOR (RUBYFILL)
25.0000 | PACK | Freq: Once | INTRAVENOUS | Status: AC
  Administered 2023-03-05: 24.27 via INTRAVENOUS

## 2023-03-05 MED ORDER — REGADENOSON 0.4 MG/5ML IV SOLN
0.4000 mg | Freq: Once | INTRAVENOUS | Status: AC
  Administered 2023-03-05: 0.4 mg via INTRAVENOUS
  Filled 2023-03-05: qty 5

## 2023-03-05 MED ORDER — HYDROCORTISONE 1 % EX CREA: 1.0000 | TOPICAL_CREAM | Freq: Three times a day (TID) | CUTANEOUS | Status: DC | PRN

## 2023-03-05 MED ORDER — PROPOFOL 10 MG/ML IV BOLUS
INTRAVENOUS | Status: AC
  Filled 2023-03-05: qty 20

## 2023-03-05 MED ORDER — PROPOFOL 10 MG/ML IV BOLUS
INTRAVENOUS | Status: DC | PRN
Start: 2023-03-05 — End: 2023-03-05
  Administered 2023-03-05: 10 mg via INTRAVENOUS
  Administered 2023-03-05: 70 mg via INTRAVENOUS

## 2023-03-05 MED ORDER — FENTANYL CITRATE (PF) 100 MCG/2ML IJ SOLN: 25.0000 ug | INTRAMUSCULAR | Status: DC | PRN

## 2023-03-05 MED ORDER — LIDOCAINE HCL (PF) 2 % IJ SOLN
INTRAMUSCULAR | Status: DC | PRN
  Administered 2023-03-05: 40 mg

## 2023-03-05 MED ORDER — OXYCODONE HCL 5 MG PO TABS: 5.0000 mg | ORAL_TABLET | Freq: Once | ORAL | Status: DC | PRN

## 2023-03-05 MED ORDER — REGADENOSON 0.4 MG/5ML IV SOLN
INTRAVENOUS | Status: AC
  Filled 2023-03-05: qty 5

## 2023-03-05 MED ORDER — RUBIDIUM RB82 GENERATOR (RUBYFILL)
25.0000 | PACK | Freq: Once | INTRAVENOUS | Status: AC
  Administered 2023-03-05: 24.29 via INTRAVENOUS

## 2023-03-05 MED ORDER — SODIUM CHLORIDE 0.9 % IV SOLN: INTRAVENOUS | Status: DC

## 2023-03-05 MED ORDER — OXYCODONE HCL 5 MG/5ML PO SOLN: 5.0000 mg | Freq: Once | ORAL | Status: DC | PRN

## 2023-03-05 NOTE — Progress Notes (Signed)
Pt is going to PET scan after specials. They requested that IV stay in and leads stay on pt's chest. Will leave them in place and wheel pt to them in wheelchair.

## 2023-03-05 NOTE — Anesthesia Preprocedure Evaluation (Signed)
Anesthesia Evaluation  Patient identified by MRN, date of birth, ID band Patient awake    Reviewed: Allergy & Precautions, NPO status , Patient's Chart, lab work & pertinent test results  History of Anesthesia Complications Negative for: history of anesthetic complications  Airway Mallampati: III  TM Distance: <3 FB Neck ROM: full    Dental  (+) Chipped   Pulmonary neg shortness of breath, sleep apnea , former smoker   Pulmonary exam normal        Cardiovascular hypertension, (-) angina + CAD and + Peripheral Vascular Disease   Rhythm:irregular Rate:Normal     Neuro/Psych negative neurological ROS  negative psych ROS   GI/Hepatic Neg liver ROS,GERD  Controlled,,  Endo/Other  negative endocrine ROS    Renal/GU negative Renal ROS  negative genitourinary   Musculoskeletal   Abdominal   Peds  Hematology negative hematology ROS (+)   Anesthesia Other Findings Past Medical History: No date: Bladder cancer (HCC) No date: Hearing loss of both ears No date: History of colon polyps     Comment:  BENIGN No date: Hyperlipidemia No date: Hypertension No date: Mitral regurgitation No date: OSA on CPAP No date: PAD (peripheral artery disease) (HCC)     Comment:  ABI--  RIGHT SIDE NORMAL LEFT SIDE MODERATELY REDUCED W/              DISTAL LEFT SFA STENOSIS//  MILD CALDICATION  : PAF (paroxysmal atrial fibrillation) (HCC)     Comment:  a. s/p PVI 2014; b. s/p redo PVI and CTI 01/2015 No date: Vitamin D deficiency  Past Surgical History: 01/26/2015: ABLATION OF DYSRHYTHMIC FOCUS 07/27/2012: ATRIAL FIBRILLATION ABLATION; N/A     Comment:  Procedure: ATRIAL FIBRILLATION ABLATION;  Surgeon: Hillis Range, MD;  Location: [facility] CATH [facility];  Service:               Cardiovascular;  Laterality: N/A; 08/25/2018: ATRIAL FIBRILLATION ABLATION; N/A     Comment:  Procedure: ATRIAL FIBRILLATION ABLATION;  Surgeon:                Hillis Range, MD;  Location: [facility] INVASIVE CV LAB;                Service: Cardiovascular;  Laterality: N/A; 10/09/2022: ATRIAL FIBRILLATION ABLATION; N/A     Comment:  Procedure: ATRIAL FIBRILLATION ABLATION;  Surgeon:               Regan Lemming, MD;  Location: [facility] INVASIVE CV LAB;               Service: Cardiovascular;  Laterality: N/A; 07-27-2012  DR Johney Frame: CARDIAC ELECTROPHYSIOLOGY STUDY AND ABLATION     Comment:  SUCCESSFUL ABLATION OF A-FIB 08/06/2012: CARDIOVERSION     Comment:  Procedure: CARDIOVERSION;  Surgeon: Pricilla Riffle, MD;                Location: [facility] ENDOSCOPY;  Service: Cardiovascular;                Laterality: N/A; 08/25/2014: CARDIOVERSION; N/A     Comment:  Procedure: CARDIOVERSION;  Surgeon: Laurey Morale, MD;              Location: [facility] ENDOSCOPY;  Service: Cardiovascular;                Laterality: N/A; 11/13/2014: CARDIOVERSION; N/A     Comment:  Procedure: CARDIOVERSION;  Surgeon: Gloris Manchester  Thornton Dales, MD;               Location: [facility] ENDOSCOPY;  Service: Cardiovascular;                Laterality: N/A; 12/28/2014: CARDIOVERSION; N/A     Comment:  Procedure: CARDIOVERSION;  Surgeon: Pricilla Riffle, MD;                Location: [facility] ENDOSCOPY;  Service: Cardiovascular;                Laterality: N/A; 01/30/2017: CARDIOVERSION; N/A     Comment:  Procedure: CARDIOVERSION;  Surgeon: Wendall Stade, MD;              Location: [facility] ENDOSCOPY;  Service: Cardiovascular;                Laterality: N/A; 04/06/2017: CARDIOVERSION; N/A     Comment:  Procedure: CARDIOVERSION;  Surgeon: Lars Masson,               MD;  Location: [facility] ENDOSCOPY;  Service: Cardiovascular;                Laterality: N/A; 05/25/2018: CARDIOVERSION; N/A     Comment:  Procedure: CARDIOVERSION;  Surgeon: Chilton Si,               MD;  Location: [facility] ENDOSCOPY;  Service: Cardiovascular;                Laterality: N/A; 08/09/2018: CARDIOVERSION; N/A     Comment:  Procedure: CARDIOVERSION;   Surgeon: Jodelle Red, MD;  Location: [facility] ENDOSCOPY;  Service:               Cardiovascular;  Laterality: N/A; 07/04/2019: CARDIOVERSION; N/A     Comment:  Procedure: CARDIOVERSION;  Surgeon: Lars Masson,               MD;  Location: [facility] ENDOSCOPY;  Service: Cardiovascular;                Laterality: N/A; 09/23/2019: CARDIOVERSION; N/A     Comment:  Procedure: CARDIOVERSION;  Surgeon: Lars Masson,               MD;  Location: [facility] ENDOSCOPY;  Service: Cardiovascular;                Laterality: N/A; 08/07/2021: CARDIOVERSION; N/A     Comment:  Procedure: CARDIOVERSION;  Surgeon: Quintella Reichert, MD;              Location: [facility] ENDOSCOPY;  Service: Cardiovascular;                Laterality: N/A; 03/17/2022: CARDIOVERSION; N/A     Comment:  Procedure: CARDIOVERSION;  Surgeon: Quintella Reichert, MD;              Location: [facility] ENDOSCOPY;  Service: Cardiovascular;                Laterality: N/A; 06/26/2022: CARDIOVERSION; N/A     Comment:  Procedure: CARDIOVERSION;  Surgeon: Chrystie Nose,               MD;  Location: [facility] ENDOSCOPY;  Service: Cardiovascular;                Laterality: N/A; 11/10/2022: CARDIOVERSION; N/A     Comment:  Procedure: CARDIOVERSION;  Surgeon: [name], MD;  Location: [facility] INVASIVE CV LAB;  Service: Cardiovascular;                Laterality: N/A; No date: CATARACT EXTRACTION W/ INTRAOCULAR LENS  IMPLANT, BILATERAL 02/13/2015: COLONOSCOPY WITH PROPOFOL; N/A     Comment:  Procedure: COLONOSCOPY WITH PROPOFOL;  Surgeon: Charolett Bumpers, MD;  Location: [facility] ENDOSCOPY;  Service:               Endoscopy;  Laterality: N/A; 04/12/2013: CYSTOSCOPY W/ RETROGRADES; Bilateral     Comment:  Procedure: CYSTOSCOPY WITH RETROGRADE PYELOGRAM;                Surgeon: Sebastian Ache, MD;  Location: [facility];  Service: Urology;  Laterality:               Bilateral; 03/06/2021: CYSTOSCOPY WITH  RETROGRADE PYELOGRAM, URETEROSCOPY AND  STENT PLACEMENT     Comment:  Procedure: CYSTOSCOPY WITH BILATERAL RETROGRADE               PYELOGRAM, LEFT URETEROSCOPY AND STENT PLACEMENT;                Surgeon: Sebastian Ache, MD;  Location: [facility];  Service: Urology;;  1 HR 01/26/2015: ELECTROPHYSIOLOGIC STUDY; N/A     Comment:  Procedure: Atrial Fibrillation Ablation;  Surgeon: Hillis Range, MD;  Location: [facility] INVASIVE CV LAB;  Service:               Cardiovascular;  Laterality: N/A; 03/06/2021: HOLMIUM LASER APPLICATION; Left     Comment:  Procedure: HOLMIUM LASER APPLICATION;  Surgeon: Sebastian Ache, MD;  Location: [facility];                Service: Urology;  Laterality: Left; No date: JOINT REPLACEMENT 11/24/2013: LEFT HEART CATHETERIZATION WITH CORONARY ANGIOGRAM; N/A     Comment:  Procedure: LEFT HEART CATHETERIZATION WITH CORONARY               ANGIOGRAM;  Surgeon: Kathleene Hazel, MD;                Location: [facility] CATH LAB;  Service: Cardiovascular;                Laterality: N/A; 11/28/2013: PERCUTANEOUS CORONARY ROTOBLATOR INTERVENTION (PCI-R); N/A     Comment:  Procedure: PERCUTANEOUS CORONARY ROTOBLATOR INTERVENTION              (PCI-R);  Surgeon: Kathleene Hazel, MD;  Location:              [facility] CATH LAB;  Service: Cardiovascular;  Laterality: N/A; 11/24/2013: PERCUTANEOUS CORONARY STENT INTERVENTION (PCI-S)     Comment:  Procedure: PERCUTANEOUS CORONARY STENT INTERVENTION               (PCI-S);  Surgeon: Kathleene Hazel, MD;  Location:              [facility] CATH LAB;  Service: Cardiovascular;; 07/26/2012: TEE WITHOUT CARDIOVERSION     Comment:  Procedure: TRANSESOPHAGEAL ECHOCARDIOGRAM (TEE);  Surgeon: Pricilla Riffle, MD;  Location: [facility] ENDOSCOPY;                Service: Cardiovascular;  Laterality: N/A; 08/25/2014: TEE WITHOUT CARDIOVERSION; N/A     Comment:  Procedure:  TRANSESOPHAGEAL ECHOCARDIOGRAM (TEE);                Surgeon: Laurey Morale, MD;  Location: [facility] ENDOSCOPY;                Service: Cardiovascular;  Laterality: N/A; 01/26/2015: TEE WITHOUT CARDIOVERSION     Comment:  Procedure: Transesophageal Echocardiogram (Tee);                Surgeon: Vesta Mixer, MD;  Location: [facility] INVASIVE CV               LAB;  Service: Cardiovascular;; 07/04/2019: TEE WITHOUT CARDIOVERSION; N/A     Comment:  Procedure: TRANSESOPHAGEAL ECHOCARDIOGRAM (TEE);                Surgeon: Lars Masson, MD;  Location: [facility] ENDOSCOPY;              Service: Cardiovascular;  Laterality: N/A; age 77: TONSILLECTOMY 07/25/2011: TOTAL HIP ARTHROPLASTY     Comment:  Procedure: TOTAL HIP ARTHROPLASTY ANTERIOR APPROACH;                Surgeon: Kathryne Hitch;  Location: [facility] ORS;                Service: Orthopedics;  Laterality: Right; 02/08/2010: TOTAL HIP ARTHROPLASTY; Left 04/12/2013: TRANSURETHRAL RESECTION OF BLADDER TUMOR WITH GYRUS  (TURBT-GYRUS); N/A     Comment:  Procedure: TRANSURETHRAL RESECTION OF BLADDER TUMOR WITH              GYRUS (TURBT-GYRUS);  Surgeon: Sebastian Ache, MD;                Location: [facility];  Service: Urology;               Laterality: N/A;  BMI    Body Mass Index: 30.42 kg/m      Reproductive/Obstetrics negative OB ROS                             Anesthesia Physical Anesthesia Plan  ASA: 3  Anesthesia Plan: General   Post-op Pain Management:    Induction: Intravenous  PONV Risk Score and Plan: Propofol infusion and TIVA  Airway Management Planned: Natural Airway and Nasal Cannula  Additional Equipment:   Intra-op Plan:   Post-operative Plan:   Informed Consent: I have reviewed the patients History and Physical, chart, labs and discussed the procedure including the risks, benefits and alternatives for the proposed anesthesia with the patient or authorized representative  who has indicated his/her understanding and acceptance.     Dental Advisory Given  Plan Discussed with: Anesthesiologist, CRNA and Surgeon  Anesthesia Plan Comments: (Patient consented for risks of anesthesia including but not limited to:  - adverse reactions to medications - risk of airway placement if required - damage to eyes, teeth, lips or other oral mucosa - nerve damage due to positioning  - sore throat or hoarseness - Damage to heart, brain, nerves, lungs, other parts of body or loss of life  Patient voiced understanding.)       Anesthesia Quick Evaluation

## 2023-03-05 NOTE — Progress Notes (Signed)
Patient presents for a cardiac PET stress test and tolerated procedure without incident. He did report some chest pressure during the test that resolved by test completion. Patient maintained acceptable vital signs throughout the test and was offered caffeine after test.  Patient ambulated out of department with a steady gait.

## 2023-03-05 NOTE — CV Procedure (Signed)
Electrical Cardioversion Procedure Note   Procedure: Electrical Cardioversion Indications:  Atrial Fibrillation  Procedure Details Consent: Risks of procedure as well as the alternatives and risks of each were explained to the (patient/caregiver).  Consent for procedure obtained. Time Out: Verified patient identification, verified procedure, site/side was marked, verified correct patient position, special equipment/implants available, medications/allergies/relevent history reviewed, required imaging and test results available.  Performed  Patient placed on cardiac monitor, pulse oximetry, supplemental oxygen as necessary.  Sedation given:  Proprotol Pacer pads placed anterior and posterior chest.  Cardioverted 1 time(s).  Cardioverted at 120J.  Evaluation Findings: Post procedure EKG shows: NSR Complications: None Patient did tolerate procedure well.   Dorothyann Peng MD 03/05/23

## 2023-03-05 NOTE — Progress Notes (Signed)
Pre-stress Cardiac PET BP and HR.

## 2023-03-05 NOTE — Anesthesia Procedure Notes (Signed)
Date/Time: 03/05/2023 8:09 AM  Performed by: Stormy Fabian, CRNAPre-anesthesia Checklist: Patient identified, Emergency Drugs available, Suction available and Patient being monitored Patient Re-evaluated:Patient Re-evaluated prior to induction Oxygen Delivery Method: Nasal cannula Induction Type: IV induction Dental Injury: Teeth and Oropharynx as per pre-operative assessment  Comments: Nasal cannula with etCO2 monitoring

## 2023-03-05 NOTE — Transfer of Care (Signed)
Immediate Anesthesia Transfer of Care Note  Patient: Damon [name]  Procedure(s) Performed: Procedure(s): CARDIOVERSION (N/A)  Patient Location: PACU and Short Stay  Anesthesia Type:General  Level of Consciousness: awake, alert  and oriented  Airway & Oxygen Therapy: Patient Spontanous Breathing and Patient connected to nasal cannula oxygen  Post-op Assessment: Report given to RN and Post -op Vital signs reviewed and stable  Post vital signs: Reviewed and stable  Last Vitals:  Vitals:   03/05/23 0819 03/05/23 0820  BP:  116/70  Pulse: 60   Resp: (!) 22 16  Temp:    SpO2: 94%     Complications: No apparent anesthesia complications

## 2023-03-06 NOTE — Anesthesia Postprocedure Evaluation (Signed)
Anesthesia Post Note  Patient: Damon [name]  Procedure(s) Performed: CARDIOVERSION  Patient location during evaluation: [facility] Recovery Anesthesia Type: General Level of consciousness: awake and alert Pain management: pain level controlled Vital Signs Assessment: post-procedure vital signs reviewed and stable Respiratory status: spontaneous breathing, nonlabored ventilation, respiratory function stable and patient connected to nasal cannula oxygen Cardiovascular status: blood pressure returned to baseline and stable Postop Assessment: no apparent nausea or vomiting Anesthetic complications: no   No notable events documented.   Last Vitals:  Vitals:   03/05/23 0855 03/05/23 0900  BP: 121/78 119/75  Pulse: (!) 57 (!) 58  Resp: 15 16  Temp:    SpO2: 96% 98%    Last Pain:  Vitals:   03/05/23 0900  TempSrc:   PainSc: 0-No pain                 Cleda Mccreedy Darcee Dekker

## 2023-03-19 ENCOUNTER — Telehealth: Payer: Self-pay | Admitting: Cardiology

## 2023-03-19 ENCOUNTER — Telehealth: Payer: Self-pay | Admitting: *Deleted

## 2023-03-19 NOTE — Telephone Encounter (Signed)
Informed patient of results and verbal understanding expressed.  

## 2023-03-19 NOTE — Telephone Encounter (Signed)
-----   Message from Will [facility] sent at 03/10/2023  1:23 PM EDT ----- Lung findings on CT per primary care, no evidence of ischemia.

## 2023-03-19 NOTE — Telephone Encounter (Signed)
Left message to call back  

## 2023-03-19 NOTE — Telephone Encounter (Signed)
Damon [name] at 03/19/2023 12:28 PM  Status: Signed  patient is returning phone call about results

## 2023-03-19 NOTE — Telephone Encounter (Signed)
patient is returning phone call about results

## 2023-03-19 NOTE — Telephone Encounter (Signed)
Duplicate.  See other telephone note started today

## 2023-04-15 ENCOUNTER — Encounter: Payer: Self-pay | Admitting: Internal Medicine

## 2023-04-15 NOTE — Patient Instructions (Signed)

## 2023-04-15 NOTE — Progress Notes (Signed)
Future Appointments  Date Time Provider Department  04/16/2023                                 6 mo  10:30 AM [name], MD [facility]  04/24/2023  8:45 AM Tonia Brooms, Rachel Bo, DO [facility]  07/14/2023 10:45 AM Regan Lemming, MD [facility]  10/12/2023                                 cpe  3:00 PM [name], MD [facility]  01/14/2024  2:30 PM Adela Glimpse, NP [facility]    History of Present Illness:                                                                                                               This very nice 77 y.o. MWM with  HTN,  ASCAD/pAfib, HLD, Pre-Diabetes and Vitamin D Deficiency presents for 6  month follow up .  In 2015, Abd CT Scan showed Aortic Atherosclerosis.       Patient is treated for HTN (1996)  & BP has been controlled at home. Today's BP is at goal -130/74.  In 2015, patient had PCA/Stenting and rotator Ablation. Patient has hx/o pAfib and multiple ablations & CV's and is on maintenance Xarelto.  Patient has had no complaints of any cardiac type chest pain, palpitations, dyspnea Pollyann Kennedy /PND, dizziness, claudication or dependent edema.        Hyperlipidemia is controlled with diet & meds. Patient denies myalgias or other med SE's. Last Lipids were at goal :  Lab Results  Component Value Date   CHOL 134 01/13/2023   HDL 62 01/13/2023   LDLCALC 54 01/13/2023   TRIG 93 01/13/2023   CHOLHDL 2.2 01/13/2023     Also, the patient has history of PreDiabetes  (A1c 5.7% /2011 & 5.9% /2013) and has had no symptoms of reactive hypoglycemia, diabetic polys, paresthesias or visual blurring.  Last A1c was normal & at goal:  Lab Results  Component Value Date   HGBA1C 5.4 10/07/2022                                                       Further, the patient also has history of Vitamin D Deficiency ("27" /2008) and supplements vitamin D without any suspected side-effects. Last vitamin D was at goal:  Lab Results  Component  Value Date   VD25OH 89 01/13/2023       Current Outpatient Medications on File Prior to Visit  Medication Sig   diltiazem  CD 180 MG  Take 1 capsule daily   bisoprolol-hctz 10-6.25 MG tablet Take 1 tablet Daily    VITAMIN D 5000 u Take  10,000 units Daily   ezetimibe (10 MG tablet TAKE 1 TABLET DAILY   MULTIVITAMIN w/MINERALS Take 1 tablet Daily   NITROSTAT 0.4 MG SL tab AS NEEDED FOR CHEST PAIN   REFRESH Place 1 drop into both eyes daily as needed (dry eyes).   rosuvastatin  40 MG tablet Take 1/2 tablet Daily f   XARELTO 20 MG TABS TAKE 1 TABLET  DAILY     No Known Allergies   PMHx:   Past Medical History:  Diagnosis Date   Bladder cancer (HCC)    Hearing loss of both ears    History of colon polyps    BENIGN   Hyperlipidemia    Hypertension    Mitral regurgitation    OSA on CPAP    PAD (peripheral artery disease) (HCC)    ABI--  RIGHT SIDE NORMAL LEFT SIDE MODERATELY REDUCED W/ DISTAL LEFT SFA STENOSIS//  MILD CALDICATION   PAF (paroxysmal atrial fibrillation) (HCC)     a. s/p PVI 2014; b. s/p redo PVI and CTI 01/2015   Vitamin D deficiency      Immunization History  Administered Date(s) Administered   DT  07/22/2003, 12/28/2013   Influenza Split 04/14/2019   Influenza Whole 05/19/2013   Influenza, High Dose  04/08/2017   Influenza,inj,Quad  05/09/2014   Influenza 05/14/2015, 05/01/2016, 05/17/2018   PFIZER-SARS-COV-2 Vacc 08/18/2019, 09/15/2019   Pneumococcal - 13 07/11/2014   Pneumococcal - 23 12/04/2009, 06/10/2016   Tdap 09/22/2013     Past Surgical History:  Procedure Laterality Date   ABLATION OF DYSRHYTHMIC FOCUS  01/26/2015   ATRIAL FIBRILLATION ABLATION N/A 07/27/2012   Procedure: ATRIAL FIBRILLATION ABLATION;  Surgeon: Hillis Range, MD;  Location: [facility] CATH LAB;  Service: Cardiovascular;  Laterality: N/A;   ATRIAL FIBRILLATION ABLATION N/A 08/25/2018   Procedure: ATRIAL FIBRILLATION ABLATION;  Surgeon: Hillis Range, MD;  Location: [facility] INVASIVE CV  LAB;  Service: Cardiovascular;  Laterality: N/A;   CARDIAC ELECTROPHYSIOLOGY STUDY AND ABLATION  07-27-2012  DR Johney Frame   SUCCESSFUL ABLATION OF A-FIB   CARDIOVERSION  08/06/2012   Procedure: CARDIOVERSION;  Surgeon: Pricilla Riffle, MD;  Location: [facility] ENDOSCOPY;  Service: Cardiovascular;  Laterality: N/A;   CARDIOVERSION N/A 08/25/2014   Procedure: CARDIOVERSION;  Surgeon: Laurey Morale, MD;  Location: [facility] ENDOSCOPY;  Service: Cardiovascular;  Laterality: N/A;   CARDIOVERSION N/A 11/13/2014   Procedure: CARDIOVERSION;  Surgeon: Quintella Reichert, MD;  Location: [facility] ENDOSCOPY;  Service: Cardiovascular;  Laterality: N/A;   CARDIOVERSION N/A 12/28/2014   Procedure: CARDIOVERSION;  Surgeon: Pricilla Riffle, MD;  Location: [facility] ENDOSCOPY;  Service: Cardiovascular;  Laterality: N/A;   CARDIOVERSION N/A 01/30/2017   Procedure: CARDIOVERSION;  Surgeon: Wendall Stade, MD;  Location: [facility] ENDOSCOPY;  Service: Cardiovascular;  Laterality: N/A;   CARDIOVERSION N/A 04/06/2017   Procedure: CARDIOVERSION;  Surgeon: Lars Masson, MD;  Location: [facility] ENDOSCOPY;  Service: Cardiovascular;  Laterality: N/A;   CARDIOVERSION N/A 05/25/2018   Procedure: CARDIOVERSION;  Surgeon: Chilton Si, MD;  Location: [facility] ENDOSCOPY;  Service: Cardiovascular;  Laterality: N/A;   CARDIOVERSION N/A 08/09/2018   Procedure: CARDIOVERSION;  Surgeon: Jodelle Red, MD;  Location: [facility] ENDOSCOPY;  Service: Cardiovascular;  Laterality: N/A;   CARDIOVERSION N/A 07/04/2019   Procedure: CARDIOVERSION;  Surgeon: Lars Masson, MD;  Location: [facility] ENDOSCOPY;  Service: Cardiovascular;  Laterality: N/A;   CARDIOVERSION N/A 09/23/2019   Procedure: CARDIOVERSION;  Surgeon: Lars Masson, MD;  Location: [facility] ENDOSCOPY;  Service: Cardiovascular;  Laterality: N/A;  CATARACT EXTRACTION W/ INTRAOCULAR LENS  IMPLANT, BILATERAL     COLONOSCOPY WITH PROPOFOL N/A 02/13/2015   Procedure: COLONOSCOPY WITH PROPOFOL;  Surgeon: Charolett Bumpers,  MD;  Location: [facility] ENDOSCOPY;  Service: Endoscopy;  Laterality: N/A;   CYSTOSCOPY W/ RETROGRADES Bilateral 04/12/2013   Procedure: CYSTOSCOPY WITH RETROGRADE PYELOGRAM;  Surgeon: Sebastian Ache, MD;  Location: [facility];  Service: Urology;  Laterality: Bilateral;   ELECTROPHYSIOLOGIC STUDY N/A 01/26/2015   Procedure: Atrial Fibrillation Ablation;  Surgeon: Hillis Range, MD;  Location: [facility] INVASIVE CV LAB;  Service: Cardiovascular;  Laterality: N/A;   JOINT REPLACEMENT     LEFT HEART CATHETERIZATION WITH CORONARY ANGIOGRAM N/A 11/24/2013   Procedure: LEFT HEART CATHETERIZATION WITH CORONARY ANGIOGRAM;  Surgeon: Kathleene Hazel, MD;  Location: [facility] CATH LAB;  Service: Cardiovascular;  Laterality: N/A;   PERCUTANEOUS CORONARY ROTOBLATOR INTERVENTION (PCI-R) N/A 11/28/2013   Procedure: PERCUTANEOUS CORONARY ROTOBLATOR INTERVENTION (PCI-R);  Surgeon: Kathleene Hazel, MD;  Location: [facility] CATH LAB;  Service: Cardiovascular;  Laterality: N/A;   PERCUTANEOUS CORONARY STENT INTERVENTION (PCI-S)  11/24/2013   Procedure: PERCUTANEOUS CORONARY STENT INTERVENTION (PCI-S);  Surgeon: Kathleene Hazel, MD;  Location: [facility] CATH LAB;  Service: Cardiovascular;;   TEE WITHOUT CARDIOVERSION  07/26/2012   Procedure: TRANSESOPHAGEAL ECHOCARDIOGRAM (TEE);  Surgeon: Pricilla Riffle, MD;  Location: [facility] ENDOSCOPY;  Service: Cardiovascular;  Laterality: N/A;   TEE WITHOUT CARDIOVERSION N/A 08/25/2014   Procedure: TRANSESOPHAGEAL ECHOCARDIOGRAM (TEE);  Surgeon: Laurey Morale, MD;  Location: [facility] ENDOSCOPY;  Service: Cardiovascular;  Laterality: N/A;   TEE WITHOUT CARDIOVERSION  01/26/2015   Procedure: Transesophageal Echocardiogram (Tee);  Surgeon: Vesta Mixer, MD;  Location: [facility] INVASIVE CV LAB;  Service: Cardiovascular;;   TEE WITHOUT CARDIOVERSION N/A 07/04/2019   Procedure: TRANSESOPHAGEAL ECHOCARDIOGRAM (TEE);  Surgeon: Lars Masson, MD;  Location: [facility] ENDOSCOPY;  Service: Cardiovascular;   Laterality: N/A;   TONSILLECTOMY  age 25   TOTAL HIP ARTHROPLASTY  07/25/2011   Procedure: TOTAL HIP ARTHROPLASTY ANTERIOR APPROACH;  Surgeon: Kathryne Hitch;  Location: [facility] ORS;  Service: Orthopedics;  Laterality: Right;   TOTAL HIP ARTHROPLASTY Left 02/08/2010   TRANSURETHRAL RESECTION OF BLADDER TUMOR WITH GYRUS (TURBT-GYRUS) N/A 04/12/2013   Procedure: TRANSURETHRAL RESECTION OF BLADDER TUMOR WITH GYRUS (TURBT-GYRUS);  Surgeon: Sebastian Ache, MD;  Location: [facility];  Service: Urology;  Laterality: N/A;    FHx:    Reviewed / unchanged  SHx:    Reviewed / unchanged   Systems Review:  Constitutional: Denies fever, chills, wt changes, headaches, insomnia, fatigue, night sweats, change in appetite. Eyes: Denies redness, blurred vision, diplopia, discharge, itchy, watery eyes.  ENT: Denies discharge, congestion, post nasal drip, epistaxis, sore throat, earache, hearing loss, dental pain, tinnitus, vertigo, sinus pain, snoring.  CV: Denies chest pain, palpitations, irregular heartbeat, syncope, dyspnea, diaphoresis, orthopnea, PND, claudication or edema. Respiratory: denies cough, dyspnea, DOE, pleurisy, hoarseness, laryngitis, wheezing.  Gastrointestinal: Denies dysphagia, odynophagia, heartburn, reflux, water brash, abdominal pain or cramps, nausea, vomiting, bloating, diarrhea, constipation, hematemesis, melena, hematochezia  or hemorrhoids. Genitourinary: Denies dysuria, frequency, urgency, nocturia, hesitancy, discharge, hematuria or flank pain. Musculoskeletal: Denies arthralgias, myalgias, stiffness, jt. swelling, pain, limping or strain/sprain.  Skin: Denies pruritus, rash, hives, warts, acne, eczema or change in skin lesion(s). Neuro: No weakness, tremor, incoordination, spasms, paresthesia or pain. Psychiatric: Denies confusion, memory loss or sensory loss. Endo: Denies change in weight, skin or hair change.  Heme/Lymph: No excessive bleeding, bruising or  enlarged lymph nodes.  Physical Exam  BP 130/74   Pulse (!) 51   Temp 97.9 F (36.6 C)   Resp 16   Ht 5\' 9"  (1.753 m)   Wt 209 lb 9.6 oz (95.1 kg)   SpO2 96%   BMI 30.95 kg/m   Appears  over nourished and in no distress.  Eyes: PERRLA, EOMs, conjunctiva no swelling or erythema. Sinuses: No frontal/maxillary tenderness ENT/Mouth: EAC's clear, TM's nl w/o erythema, bulging. Nares clear w/o erythema, swelling, exudates. Oropharynx clear without erythema or exudates. Oral hygiene is good. Tongue normal, non obstructing. Hearing intact.  Neck: Supple. Thyroid not palpable. Car 2+/2+ without bruits, nodes or JVD. Chest: Respirations nl with BS clear & equal w/o rales, rhonchi, wheezing or stridor.  Cor: Heart sounds normal w/ regular rate and rhythm without sig. murmurs, gallops, clicks or rubs. Peripheral pulses normal and equal  without edema.  Abdomen: Soft & bowel sounds normal. Non-tender w/o guarding, rebound, hernias, masses or organomegaly.  Lymphatics: Unremarkable.  Musculoskeletal: Full ROM all peripheral extremities, joint stability, 5/5 strength and normal gait.  Skin: Warm, dry without exposed rashes, lesions or ecchymosis apparent.  Neuro: Cranial nerves intact, reflexes equal bilaterally. Sensory-motor testing grossly intact. Tendon reflexes grossly intact.  Pysch: Alert & oriented x 3.  Insight and judgement nl & appropriate. No ideations.  Assessment and Plan:  1. Essential hypertension  - Continue medication, monitor blood pressure at home.  - Continue DASH diet.  Reminder to go to the ER if any CP,  SOB, nausea, dizziness, severe HA, changes vision/speech.   - CBC with Differential/Platelet - COMPLETE METABOLIC PANEL WITH GFR - Magnesium - TSH  2. Hyperlipidemia, mixed  - Continue diet/meds, exercise,& lifestyle modifications.  - Continue monitor periodic cholesterol/liver & renal functions    - Lipid panel - TSH  3. Abnormal glucose  - Continue  diet, exercise  - Lifestyle modifications.  - Monitor appropriate labs   - Hemoglobin A1c - Insulin, random  4. Vitamin D deficiency  - Continue supplementation.    - VITAMIN D 25 Hydroxy   5. Paroxysmal atrial fibrillation (HCC)  - TSH  6. Atherosclerosis of native coronary artery without angina                                     pectoris, unspecified whether native or transplanted heart  - Lipid panel  7. Aortic atherosclerosis (HCC) by Abd CT Scan in 2015  - Lipid panel  8. Medication management  - CBC with Differential/Platelet - COMPLETE METABOLIC PANEL WITH GFR - Magnesium - Lipid panel - TSH - Hemoglobin A1c - Insulin, random - VITAMIN D 25 Hydroxy          Discussed  regular exercise, BP monitoring, weight control to achieve/maintain BMI less than 25 and discussed med and SE's. Recommended labs to assess and monitor clinical status with further disposition pending results of labs.  I discussed the assessment and treatment plan with the patient. The patient was provided an opportunity to ask questions and all were answered. The patient agreed with the plan and demonstrated an understanding of the instructions.  I provided over 30 minutes of exam, counseling, chart review and  complex critical decision making.        The patient was advised to call back or seek an in-person evaluation if the symptoms worsen or if the condition fails to improve as anticipated.   Marinus Maw, MD.

## 2023-04-16 ENCOUNTER — Ambulatory Visit (INDEPENDENT_AMBULATORY_CARE_PROVIDER_SITE_OTHER): Payer: Medicare Other | Admitting: Internal Medicine

## 2023-04-16 ENCOUNTER — Encounter: Payer: Self-pay | Admitting: Internal Medicine

## 2023-04-16 VITALS — BP 130/74 | HR 51 | Temp 97.9°F | Resp 16 | Ht 69.0 in | Wt 209.6 lb

## 2023-04-16 DIAGNOSIS — I480 Paroxysmal atrial fibrillation: Secondary | ICD-10-CM

## 2023-04-16 DIAGNOSIS — Z23 Encounter for immunization: Secondary | ICD-10-CM

## 2023-04-16 DIAGNOSIS — R7309 Other abnormal glucose: Secondary | ICD-10-CM | POA: Diagnosis not present

## 2023-04-16 DIAGNOSIS — I10 Essential (primary) hypertension: Secondary | ICD-10-CM

## 2023-04-16 DIAGNOSIS — E559 Vitamin D deficiency, unspecified: Secondary | ICD-10-CM

## 2023-04-16 DIAGNOSIS — E782 Mixed hyperlipidemia: Secondary | ICD-10-CM | POA: Diagnosis not present

## 2023-04-16 DIAGNOSIS — I700 Atherosclerosis of aorta: Secondary | ICD-10-CM

## 2023-04-16 DIAGNOSIS — Z79899 Other long term (current) drug therapy: Secondary | ICD-10-CM

## 2023-04-16 LAB — CBC WITH DIFFERENTIAL/PLATELET
Absolute Monocytes: 519 cells/uL (ref 200–950)
Basophils Absolute: 71 cells/uL (ref 0–200)
Basophils Relative: 1.2 %
Eosinophils Absolute: 189 cells/uL (ref 15–500)
Eosinophils Relative: 3.2 %
HCT: 44.6 % (ref 38.5–50.0)
Hemoglobin: 14.9 g/dL (ref 13.2–17.1)
Lymphs Abs: 1280 cells/uL (ref 850–3900)
MCH: 30.5 pg (ref 27.0–33.0)
MCHC: 33.4 g/dL (ref 32.0–36.0)
MCV: 91.2 fL (ref 80.0–100.0)
MPV: 9.8 fL (ref 7.5–12.5)
Monocytes Relative: 8.8 %
Neutro Abs: 3841 cells/uL (ref 1500–7800)
Neutrophils Relative %: 65.1 %
Platelets: 195 10*3/uL (ref 140–400)
RBC: 4.89 10*6/uL (ref 4.20–5.80)
RDW: 12.4 % (ref 11.0–15.0)
Total Lymphocyte: 21.7 %
WBC: 5.9 10*3/uL (ref 3.8–10.8)

## 2023-04-17 LAB — LIPID PANEL
Cholesterol: 131 mg/dL (ref <200)
HDL: 64 mg/dL (ref >=40)
LDL Cholesterol (Calc): 54 mg/dL
Non-HDL Cholesterol (Calc): 67 mg/dL (ref <130)
Total CHOL/HDL Ratio: 2 (calc) (ref <5.0)
Triglycerides: 52 mg/dL (ref <150)

## 2023-04-17 LAB — HEMOGLOBIN A1C
Hgb A1c MFr Bld: 5.8 %{Hb} — ABNORMAL HIGH (ref <5.7)
Mean Plasma Glucose: 120 mg/dL
eAG (mmol/L): 6.6 mmol/L

## 2023-04-17 LAB — COMPLETE METABOLIC PANEL WITH GFR
AG Ratio: 2 (calc) (ref 1.0–2.5)
ALT: 18 U/L (ref 9–46)
AST: 18 U/L (ref 10–35)
Albumin: 4.3 g/dL (ref 3.6–5.1)
Alkaline phosphatase (APISO): 51 U/L (ref 35–144)
BUN: 14 mg/dL (ref 7–25)
CO2: 26 mmol/L (ref 20–32)
Calcium: 9.2 mg/dL (ref 8.6–10.3)
Chloride: 109 mmol/L (ref 98–110)
Creat: 0.91 mg/dL (ref 0.70–1.28)
Globulin: 2.1 g/dL (ref 1.9–3.7)
Glucose, Bld: 90 mg/dL (ref 65–99)
Potassium: 4.1 mmol/L (ref 3.5–5.3)
Sodium: 142 mmol/L (ref 135–146)
Total Bilirubin: 1.8 mg/dL — ABNORMAL HIGH (ref 0.2–1.2)
Total Protein: 6.4 g/dL (ref 6.1–8.1)
eGFR: 87 mL/min/{1.73_m2} (ref >=60)

## 2023-04-17 LAB — INSULIN, RANDOM: Insulin: 11.2 u[IU]/mL

## 2023-04-17 LAB — TSH: TSH: 1.08 m[IU]/L (ref 0.40–4.50)

## 2023-04-17 LAB — MAGNESIUM: Magnesium: 2 mg/dL (ref 1.5–2.5)

## 2023-04-17 LAB — VITAMIN D 25 HYDROXY (VIT D DEFICIENCY, FRACTURES): Vit D, 25-Hydroxy: 90 ng/mL (ref 30–100)

## 2023-04-18 NOTE — Progress Notes (Signed)
<>*<>*<>*<>*<>*<>*<>*<>*<>*<>*<>*<>*<>*<>*<>*<>*<>*<>*<>*<>*<>*<>*<>*<>*<> <>*<>*<>*<>*<>*<>*<>*<>*<>*<>*<>*<>*<>*<>*<>*<>*<>*<>*<>*<>*<>*<>*<>*<>*<>  -  Test results slightly outside the reference range are not unusual. If there is anything important, I will review this with you,  otherwise it is considered normal test values.  If you have further questions,  please do not hesitate to contact me at the office or via My Chart.   <>*<>*<>*<>*<>*<>*<>*<>*<>*<>*<>*<>*<>*<>*<>*<>*<>*<>*<>*<>*<>*<>*<>*<>*<> <>*<>*<>*<>*<>*<>*<>*<>*<>*<>*<>*<>*<>*<>*<>*<>*<>*<>*<>*<>*<>*<>*<>*<>*<>  - A1c is now up to 5.8%  ( 1st time since 2017) and is          elevated in the borderline and early or pre-diabetes range which  has the same   300% increased risk for heart attack, stroke, cancer and                                      alzheimer- type vascular dementia as full blown diabetes.   But the good news is that diet, exercise with                                                weight loss can cure the early diabetes at this point.  <>*<>*<>*<>*<>*<>*<>*<>*<>*<>*<>*<>*<>*<>*<>*<>*<>*<>*<>*<>*<>*<>*<>*<>*<> <>*<>*<>*<>*<>*<>*<>*<>*<>*<>*<>*<>*<>*<>*<>*<>*<>*<>*<>*<>*<>*<>*<>*<>*<>  -   Chol = 131   and  LDL = 54  - Both    Excellent   - Very low risk for Heart Attack  / Stroke  <>*<>*<>*<>*<>*<>*<>*<>*<>*<>*<>*<>*<>*<>*<>*<>*<>*<>*<>*<>*<>*<>*<>*<>*<> <>*<>*<>*<>*<>*<>*<>*<>*<>*<>*<>*<>*<>*<>*<>*<>*<>*<>*<>*<>*<>*<>*<>*<>*<>  -   Vitamin D = 90 - Excellent - Please keep dosage same   <>*<>*<>*<>*<>*<>*<>*<>*<>*<>*<>*<>*<>*<>*<>*<>*<>*<>*<>*<>*<>*<>*<>*<>*<> <>*<>*<>*<>*<>*<>*<>*<>*<>*<>*<>*<>*<>*<>*<>*<>*<>*<>*<>*<>*<>*<>*<>*<>*<>  -   All Else - CBC - Kidneys - Electrolytes - Liver - Magnesium & Thyroid    - all  Normal / OK  <>*<>*<>*<>*<>*<>*<>*<>*<>*<>*<>*<>*<>*<>*<>*<>*<>*<>*<>*<>*<>*<>*<>*<>*<> <>*<>*<>*<>*<>*<>*<>*<>*<>*<>*<>*<>*<>*<>*<>*<>*<>*<>*<>*<>*<>*<>*<>*<>*<>  -   Keep up the  [place] Work  !   <>*<>*<>*<>*<>*<>*<>*<>*<>*<>*<>*<>*<>*<>*<>*<>*<>*<>*<>*<>*<>*<>*<>*<>*<> <>*<>*<>*<>*<>*<>*<>*<>*<>*<>*<>*<>*<>*<>*<>*<>*<>*<>*<>*<>*<>*<>*<>*<>*<>

## 2023-04-19 ENCOUNTER — Encounter: Payer: Self-pay | Admitting: Internal Medicine

## 2023-04-23 ENCOUNTER — Ambulatory Visit
Admission: RE | Admit: 2023-04-23 | Discharge: 2023-04-23 | Disposition: A | Discharge status: Home / Self Care | Payer: Medicare Other | Source: Ambulatory Visit | Attending: Pulmonary Disease | Admitting: Pulmonary Disease

## 2023-04-23 DIAGNOSIS — R918 Other nonspecific abnormal finding of lung field: Secondary | ICD-10-CM

## 2023-04-23 DIAGNOSIS — R911 Solitary pulmonary nodule: Secondary | ICD-10-CM

## 2023-04-24 ENCOUNTER — Ambulatory Visit: Payer: Medicare Other | Admitting: Pulmonary Disease

## 2023-05-11 ENCOUNTER — Encounter: Payer: Self-pay | Admitting: Internal Medicine

## 2023-05-12 NOTE — Progress Notes (Signed)
Has appt with BW in November. If MAI symptoms are present would consider sputum cultures for afb   Thanks,  BLI  Josephine Igo, DO [facility] 05/12/2023 3:34 PM

## 2023-05-25 ENCOUNTER — Other Ambulatory Visit: Payer: Self-pay | Admitting: Nurse Practitioner

## 2023-06-08 ENCOUNTER — Ambulatory Visit (INDEPENDENT_AMBULATORY_CARE_PROVIDER_SITE_OTHER): Payer: Medicare Other | Admitting: Primary Care

## 2023-06-08 ENCOUNTER — Encounter: Payer: Self-pay | Admitting: Primary Care

## 2023-06-08 VITALS — BP 127/76 | HR 57 | Temp 98.1°F | Ht 69.0 in | Wt 204.6 lb

## 2023-06-08 DIAGNOSIS — J479 Bronchiectasis, uncomplicated: Secondary | ICD-10-CM | POA: Diagnosis not present

## 2023-06-08 DIAGNOSIS — R911 Solitary pulmonary nodule: Secondary | ICD-10-CM | POA: Diagnosis not present

## 2023-06-08 NOTE — Patient Instructions (Addendum)
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 Since you are primarily asymptomatic we will continue to monitor your symptoms, notify office if you develop any cough, sputum production, shortness of breath or fevers.    Check sputum culture if cough becomes productive  Follow-up in 6 months for check in

## 2023-06-08 NOTE — Progress Notes (Signed)
@Patient  ID: Damon [name], male    DOB: [birth year], 77 y.o.   MRN: [id number]  Chief Complaint  Patient presents with   Follow-up    Referring provider: [name], MD  HPI: 77 year old male, former smoker quit 1983.  Past medical history significant for hypertension, paroxysmal A-fib, coronary artery disease, OSA on CPAP, GERD, bladder cancer, vitamin D deficiency, insomnia.  Dr. Tonia Brooms.  Previous [facility] pulmonary encounter: 10/24/22 This is a 77 year old gentleman, former smoker quit 1983, smoked on and off for a couple years.  At times was a heavier smoker.  He has some right middle lobe changes with tree-in-bud infiltrates and some subpleural peripheral changes on CT which could just represent atelectasis.   Plan: Will have a repeat HRCT chest in 6 months. Patient follow-up with [place] after CT complete    06/08/2023 Discussed the use of AI scribe software for clinical note transcription with the patient, who gave verbal consent to proceed.  History of Present Illness   Presents today for follow-up.  He was seen by Dr. Tonia Brooms in April for pulmonary nodule. He reports feeling well with no symptoms of shortness of breath or cough. He denies any sputum production or fevers. He has some minor sinus drainage which is not causing him any issues.  CT chest 04/23/2023 showed mild mid and lower lung zone predominant bronchiectasis, peribronchial thickening and peribronchial vascular nodularity suggestive of Mycobacterium AVM complex. A small 6mm nodule to lateral left lower lobe remains unchanged since January 2020 compatible with a benign etiology.   The patient was also found to have gallstones, but they are not causing any discomfort. 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 Plans to discuss with primary care provider.   The patient occasionally experiences a cough, but it is not significant or frequent. The option of a sputum sample to rule out infection  was discussed, but the patient does not believe he coughs up enough mucus to be able to obtain a specimen today.       No Known Allergies  Immunization History  Administered Date(s) Administered   DT (Pediatric) 07/22/2003, 12/28/2013   Influenza Split 04/14/2019   Influenza Whole 05/19/2013   Influenza, High Dose Seasonal PF 04/08/2017, 05/17/2018, 04/14/2019, 05/28/2021, 04/22/2022, 04/16/2023   Influenza,inj,Quad PF,6+ Mos 05/09/2014   Influenza-Unspecified 05/14/2015, 05/01/2016, 05/17/2018   PFIZER(Purple Top)SARS-COV-2 Vaccination 08/18/2019, 09/15/2019   Pneumococcal Conjugate-13 07/11/2014   Pneumococcal Polysaccharide-23 12/04/2009, 06/10/2016   Tdap 09/22/2013    Past Medical History:  Diagnosis Date   Bladder cancer (HCC)    Hearing loss of both ears    History of colon polyps    BENIGN   Hyperlipidemia    Hypertension    Mitral regurgitation    OSA on CPAP    PAD (peripheral artery disease) (HCC)    ABI--  RIGHT SIDE NORMAL LEFT SIDE MODERATELY REDUCED W/ DISTAL LEFT SFA STENOSIS//  MILD CALDICATION   PAF (paroxysmal atrial fibrillation) (HCC)     a. s/p PVI 2014; b. s/p redo PVI and CTI 01/2015   Vitamin D deficiency     Tobacco History: Social History   Tobacco Use  Smoking Status Former   Current packs/day: 0.00   Average packs/day: 2.0 packs/day for 10.0 years (20.0 ttl pk-yrs)   Types: Cigarettes   Start date: 09/21/1971   Quit date: 09/20/1981   Years since quitting: 41.7  Smokeless Tobacco Never  Tobacco Comments   Former smoker 01/07/22   Counseling given:  Not Answered Tobacco comments: Former smoker 01/07/22   Outpatient Medications Prior to Visit  Medication Sig Dispense Refill   bisoprolol-hydrochlorothiazide (ZIAC) 10-6.25 MG tablet Take 0.5 tablets by mouth daily. 45 tablet 3   Cholecalciferol (VITAMIN D3) 125 MCG (5000 UT) CAPS Take 10,000 units Daily 30 capsule    diltiazem (CARDIZEM CD) 180 MG 24 hr capsule TAKE 1 CAPSULE BY MOUTH  DAILY 90 capsule 3   diltiazem (CARDIZEM) 30 MG tablet Take 1 tablet every 4 hours AS NEEDED for AFIB heart rate >100 as long as top BP >100. 30 tablet 1   ezetimibe (ZETIA) 10 MG tablet TAKE 1 TABLET BY MOUTH DAILY FOR CHOLESTEROL 90 tablet 3   Multiple Vitamin (MULTIVITAMIN WITH MINERALS) TABS tablet Take 1 tablet Daily     nitroGLYCERIN (NITROSTAT) 0.4 MG SL tablet DISSOLVE ONE TABLET UNDER THE TONGUE EVERY 5 MINUTES AS NEEDED FOR CHEST PAIN.DO NOT EXCEED A TOTAL OF 3 DOSES IN 15 MINUTES 25 tablet 6   Polyvinyl Alcohol-Povidone (REFRESH OP) Place 1 drop into both eyes 3 (three) times daily as needed (dry/irritated eyes.).     rosuvastatin (CRESTOR) 40 MG tablet TAKE ONE-HALF TABLET BY MOUTH  DAILY FOR CHOLESTEROL 45 tablet 3   XARELTO 20 MG TABS tablet TAKE 1 TABLET BY MOUTH DAILY  WITH SUPPER 90 tablet 3   amiodarone (PACERONE) 100 MG tablet Take 100 mg by mouth daily. (Patient not taking: Reported on 06/08/2023)     No facility-administered medications prior to visit.   Review of Systems  Review of Systems  Constitutional:  Negative for fever.  HENT: Negative.    Respiratory:  Negative for cough and shortness of breath.    Physical Exam  BP 127/76 (BP Location: Left Arm, Patient Position: Sitting, Cuff Size: Normal)   Pulse (!) 57   Temp 98.1 F (36.7 C) (Oral)   Ht 5\' 9"  (1.753 m)   Wt 204 lb 9.6 oz (92.8 kg)   SpO2 93%   BMI 30.21 kg/m  Physical Exam Constitutional:      Appearance: Normal appearance.  HENT:     Head: Normocephalic and atraumatic.     Right Ear: Tympanic membrane normal.     Left Ear: Tympanic membrane normal.     Mouth/Throat:     Mouth: Mucous membranes are moist.     Pharynx: Oropharynx is clear.  Cardiovascular:     Rate and Rhythm: Normal rate and regular rhythm.  Pulmonary:     Effort: Pulmonary effort is normal.     Breath sounds: Normal breath sounds. No wheezing or rhonchi.  Musculoskeletal:        General: Normal range of motion.      Cervical back: Normal range of motion and neck supple.  Skin:    General: Skin is warm and dry.  Neurological:     General: No focal deficit present.     Mental Status: He is alert and oriented to person, place, and time. Mental status is at baseline.  Psychiatric:        Mood and Affect: Mood normal.        Behavior: Behavior normal.        Thought Content: Thought content normal.        Judgment: Judgment normal.      Lab Results:  CBC    Component Value Date/Time   WBC 5.9 04/16/2023 1040   RBC 4.89 04/16/2023 1040   HGB 14.9 04/16/2023 1040   HGB 14.5 09/18/2022 1013  HCT 44.6 04/16/2023 1040   HCT 42.5 09/18/2022 1013   PLT 195 04/16/2023 1040   PLT 204 09/18/2022 1013   MCV 91.2 04/16/2023 1040   MCV 90 09/18/2022 1013   MCH 30.5 04/16/2023 1040   MCHC 33.4 04/16/2023 1040   RDW 12.4 04/16/2023 1040   RDW 12.0 09/18/2022 1013   LYMPHSABS 1,280 04/16/2023 1040   MONOABS 0.6 08/02/2018 0955   EOSABS 189 04/16/2023 1040   BASOSABS 71 04/16/2023 1040    BMET    Component Value Date/Time   NA 142 04/16/2023 1040   NA 143 09/18/2022 1013   K 4.1 04/16/2023 1040   CL 109 04/16/2023 1040   CO2 26 04/16/2023 1040   GLUCOSE 90 04/16/2023 1040   BUN 14 04/16/2023 1040   BUN 12 09/18/2022 1013   CREATININE 0.91 04/16/2023 1040   CALCIUM 9.2 04/16/2023 1040   GFRNONAA >60 11/06/2022 0839   GFRNONAA 87 11/29/2020 1113   GFRAA 101 11/29/2020 1113    BNP No results found for: "BNP"  ProBNP No results found for: "PROBNP"  Imaging: No results found.   Assessment & Plan:   1. Lung nodule  2. Bronchiectasis without complication (HCC) - Respiratory or Resp and Sputum Culture; Future  Lung Nodule Stable 6mm nodule in the left lower lobe since 2020, compatible with a benign nodule. No change in size or characteristics. -No further follow-up necessary for this nodule.  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 However, the patient is asymptomatic with no cough, mucus production, or fever. -Consider sputum sample collection for culture if symptoms develop, he was give specimen cup today  -Continue monitoring symptoms and report any changes.  Gallstones Asymptomatic gallstones noted on CT scan. -Continue current management strategy of observation as per primary care physician's advice.  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 -Discuss with primary care physician during next visit in January.      FU in 6-12 months   Glenford Bayley, NP 06/08/2023

## 2023-07-02 ENCOUNTER — Other Ambulatory Visit: Payer: Self-pay

## 2023-07-02 ENCOUNTER — Other Ambulatory Visit (HOSPITAL_COMMUNITY): Payer: Self-pay | Admitting: *Deleted

## 2023-07-02 MED ORDER — RIVAROXABAN 20 MG PO TABS: 20.0000 mg | ORAL_TABLET | Freq: Every day | ORAL | 1 refills | Status: DC

## 2023-07-02 NOTE — Telephone Encounter (Signed)
Prescription refill request for Xarelto received.  Indication: Afib  Last office visit: 01/09/23 ([place])  Weight: 92.8kg Age: 77 Scr: 0.91 (04/16/23)  CrCl: 87.74ml/min  Appropriate dose. Refill sent.

## 2023-07-12 NOTE — Progress Notes (Unsigned)
  Electrophysiology Office Note:   Date:  07/14/2023  ID:  Damon [name], DOB May 20, 1946, MRN [id number]  Primary Cardiologist: Verne Carrow, MD Primary Heart Failure: None Electrophysiologist: Nasier Thumm Jorja Loa, MD      History of Present Illness:   Damon [name] is a 77 y.o. male with h/o atrial fibrillation seen today for routine electrophysiology followup.   Since last being seen in our clinic the patient reports doing overall well.  He has no chest pain or shortness of breath.  He is able to do all of his daily activities without restriction.  He is happy with his control.  He did require cardioversion in August, but has not had no further episodes of atrial fibrillation since that time.  he denies chest pain, palpitations, dyspnea, PND, orthopnea, nausea, vomiting, dizziness, syncope, edema, weight gain, or early satiety.   Review of systems complete and found to be negative unless listed in HPI.   EP Information / Studies Reviewed:    EKG is ordered today. Personal review as below.  EKG Interpretation Date/Time:  Tuesday July 14 2023 10:39:38 EST Ventricular Rate:  56 PR Interval:  174 QRS Duration:  90 QT Interval:  448 QTC Calculation: 432 R Axis:   34  Text Interpretation: Sinus bradycardia Septal infarct , age undetermined When compared with ECG of 05-Mar-2023 08:20, No significant change since last tracing Confirmed by Damon [name] ([id number]) on 07/14/2023 10:46:38 AM     Risk Assessment/Calculations:    CHA2DS2-VASc Score = 4   This indicates a 4.8% annual risk of stroke. The patient's score is based upon: CHF History: 0 HTN History: 1 Diabetes History: 0 Stroke History: 0 Vascular Disease History: 1 Age Score: 2 Gender Score: 0            Physical Exam:   VS:  BP 120/74 (BP Location: Left Arm, Patient Position: Sitting, Cuff Size: Large)   Pulse (!) 56   Ht 5\' 9"  (1.753 m)   Wt 203 lb 12.8 oz (92.4 kg)   SpO2 (!) 56%   BMI 30.10  kg/m    Wt Readings from Last 3 Encounters:  07/14/23 203 lb 12.8 oz (92.4 kg)  06/08/23 204 lb 9.6 oz (92.8 kg)  04/16/23 209 lb 9.6 oz (95.1 kg)     GEN: Well nourished, well developed in no acute distress NECK: No JVD; No carotid bruits CARDIAC: Regular rate and rhythm, no murmurs, rubs, gallops RESPIRATORY:  Clear to auscultation without rales, wheezing or rhonchi  ABDOMEN: Soft, non-tender, non-distended EXTREMITIES:  No edema; No deformity   ASSESSMENT AND PLAN:    1.  Persistent atrial fibrillation/flutter: Currently on diltiazem.  Post multiple ablations, most recently 10/09/2022.  He is continue to have episodes of atrial fibrillation, though overall feels well.  [name] continue with current management.  If further episodes recur, he would potentially be dofetilide candidate.  2.  Secondary hypercoagulable state: Currently on Xarelto for atrial fibrillation  3.  Hypertension:well controlled  4.  Obstructive sleep apnea: CPAP compliance encouraged  5.  Coronary artery disease: Has a stent in the past.  Elevated calcium score.  Plan per primary cardiology.  Follow up with [facility] in 6 months  Signed, [name], MD

## 2023-07-14 ENCOUNTER — Encounter: Payer: Self-pay | Admitting: Cardiology

## 2023-07-14 ENCOUNTER — Ambulatory Visit: Payer: Medicare Other | Attending: Cardiology | Admitting: Cardiology

## 2023-07-14 VITALS — BP 120/74 | HR 56 | Ht 69.0 in | Wt 203.8 lb

## 2023-07-14 DIAGNOSIS — I2510 Atherosclerotic heart disease of native coronary artery without angina pectoris: Secondary | ICD-10-CM

## 2023-07-14 DIAGNOSIS — I4819 Other persistent atrial fibrillation: Secondary | ICD-10-CM | POA: Diagnosis not present

## 2023-07-14 DIAGNOSIS — D6869 Other thrombophilia: Secondary | ICD-10-CM | POA: Diagnosis not present

## 2023-07-14 DIAGNOSIS — I10 Essential (primary) hypertension: Secondary | ICD-10-CM

## 2023-07-14 DIAGNOSIS — G4733 Obstructive sleep apnea (adult) (pediatric): Secondary | ICD-10-CM | POA: Diagnosis not present

## 2023-07-14 NOTE — Patient Instructions (Signed)
Medication Instructions:  Your physician recommends that you continue on your current medications as directed. Please refer to the Current Medication list given to you today.  *If you need a refill on your cardiac medications before your next appointment, please call your pharmacy*   Lab Work: None ordered   Testing/Procedures: None ordered   Follow-Up: At [facility], you and your health needs are our priority.  As part of our continuing mission to provide you with exceptional heart care, we have created designated Provider Care Teams.  These Care Teams include your primary Cardiologist (physician) and Advanced Practice Providers (APPs -  Physician Assistants and Nurse Practitioners) who all work together to provide you with the care you need, when you need it.  Your next appointment:   6 month(s)  The format for your next appointment:   In Person  Provider:   You will follow up in the Atrial Fibrillation Clinic located at [facility]. Your provider will be: Clint R. Fenton, PA-C or Lake Bells, PA-C    Thank you for choosing [facility]!!   Dory Horn, RN [phone]

## 2023-07-23 ENCOUNTER — Ambulatory Visit: Payer: Medicare Other | Admitting: Nurse Practitioner

## 2023-07-29 ENCOUNTER — Ambulatory Visit (INDEPENDENT_AMBULATORY_CARE_PROVIDER_SITE_OTHER): Payer: Medicare Other | Admitting: Nurse Practitioner

## 2023-07-29 ENCOUNTER — Other Ambulatory Visit: Payer: Self-pay

## 2023-07-29 ENCOUNTER — Encounter: Payer: Self-pay | Admitting: Nurse Practitioner

## 2023-07-29 VITALS — BP 118/70 | HR 67 | Temp 97.8°F | Ht 69.0 in | Wt 205.6 lb

## 2023-07-29 DIAGNOSIS — C679 Malignant neoplasm of bladder, unspecified: Secondary | ICD-10-CM

## 2023-07-29 DIAGNOSIS — Z0000 Encounter for general adult medical examination without abnormal findings: Secondary | ICD-10-CM

## 2023-07-29 DIAGNOSIS — Z1152 Encounter for screening for COVID-19: Secondary | ICD-10-CM

## 2023-07-29 DIAGNOSIS — Z860100 Personal history of colon polyps, unspecified: Secondary | ICD-10-CM

## 2023-07-29 DIAGNOSIS — I480 Paroxysmal atrial fibrillation: Secondary | ICD-10-CM | POA: Diagnosis not present

## 2023-07-29 DIAGNOSIS — I739 Peripheral vascular disease, unspecified: Secondary | ICD-10-CM

## 2023-07-29 DIAGNOSIS — I2510 Atherosclerotic heart disease of native coronary artery without angina pectoris: Secondary | ICD-10-CM | POA: Diagnosis not present

## 2023-07-29 DIAGNOSIS — K219 Gastro-esophageal reflux disease without esophagitis: Secondary | ICD-10-CM

## 2023-07-29 DIAGNOSIS — Z79899 Other long term (current) drug therapy: Secondary | ICD-10-CM

## 2023-07-29 DIAGNOSIS — R7309 Other abnormal glucose: Secondary | ICD-10-CM

## 2023-07-29 DIAGNOSIS — E782 Mixed hyperlipidemia: Secondary | ICD-10-CM

## 2023-07-29 DIAGNOSIS — H903 Sensorineural hearing loss, bilateral: Secondary | ICD-10-CM

## 2023-07-29 DIAGNOSIS — E559 Vitamin D deficiency, unspecified: Secondary | ICD-10-CM

## 2023-07-29 DIAGNOSIS — E66811 Obesity, class 1: Secondary | ICD-10-CM

## 2023-07-29 DIAGNOSIS — I700 Atherosclerosis of aorta: Secondary | ICD-10-CM | POA: Diagnosis not present

## 2023-07-29 DIAGNOSIS — J069 Acute upper respiratory infection, unspecified: Secondary | ICD-10-CM

## 2023-07-29 DIAGNOSIS — F5102 Adjustment insomnia: Secondary | ICD-10-CM

## 2023-07-29 DIAGNOSIS — I10 Essential (primary) hypertension: Secondary | ICD-10-CM

## 2023-07-29 DIAGNOSIS — I340 Nonrheumatic mitral (valve) insufficiency: Secondary | ICD-10-CM

## 2023-07-29 DIAGNOSIS — G4733 Obstructive sleep apnea (adult) (pediatric): Secondary | ICD-10-CM

## 2023-07-29 LAB — POC COVID19 BINAXNOW: SARS Coronavirus 2 Ag: NEGATIVE

## 2023-07-29 MED ORDER — AZITHROMYCIN 250 MG PO TABS: ORAL_TABLET | ORAL | 1 refills | Status: DC

## 2023-07-29 NOTE — Patient Instructions (Signed)
 Healthy Eating, Adult Healthy eating may help you get and keep a healthy body weight, reduce the risk of chronic disease, and live a long and productive life. It is important to follow a healthy eating pattern. Your nutritional and calorie needs should be met mainly by different nutrient-rich foods. What are tips for following this plan? Reading food labels Read labels and choose the following: Reduced or low sodium products. Juices with 100% fruit juice. Foods with low saturated fats (<3 g per serving) and high polyunsaturated and monounsaturated fats. Foods with whole grains, such as whole wheat, cracked wheat, brown rice, and wild rice. Whole grains that are fortified with folic acid . This is recommended for females who are pregnant or who want to become pregnant. Read labels and do not eat or drink the following: Foods or drinks with added sugars. These include foods that contain brown sugar, corn sweetener, corn syrup, dextrose , fructose, glucose, high-fructose corn syrup, honey, invert sugar, lactose, malt syrup, maltose, molasses, raw sugar, sucrose, trehalose, or turbinado sugar. Limit your intake of added sugars to less than 10% of your total daily calories. Do not eat more than the following amounts of added sugar per day: 6 teaspoons (25 g) for females. 9 teaspoons (38 g) for males. Foods that contain processed or refined starches and grains. Refined grain products, such as white flour, degermed cornmeal, white bread, and white rice. Shopping Choose nutrient-rich snacks, such as vegetables, whole fruits, and nuts. Avoid high-calorie and high-sugar snacks, such as potato chips, fruit snacks, and candy. Use oil-based dressings and spreads on foods instead of solid fats such as butter, margarine, sour cream, or cream cheese. Limit pre-made sauces, mixes, and instant products such as flavored rice, instant noodles, and ready-made pasta. Try more plant-protein sources, such as tofu,  tempeh, black beans, edamame, lentils, nuts, and seeds. Explore eating plans such as the Mediterranean diet or vegetarian diet. Try heart-healthy dips made with beans and healthy fats like hummus and guacamole. Vegetables go great with these. Cooking Use oil to saut or stir-fry foods instead of solid fats such as butter, margarine, or lard. Try baking, boiling, grilling, or broiling instead of frying. Remove the fatty part of meats before cooking. Steam vegetables in water  or broth. Meal planning  At meals, imagine dividing your plate into fourths: One-half of your plate is fruits and vegetables. One-fourth of your plate is whole grains. One-fourth of your plate is protein, especially lean meats, poultry, eggs, tofu, beans, or nuts. Include low-fat dairy as part of your daily diet. Lifestyle Choose healthy options in all settings, including home, work, school, restaurants, or stores. Prepare your food safely: Wash your hands after handling raw meats. Where you prepare food, keep surfaces clean by regularly washing with hot, soapy water . Keep raw meats separate from ready-to-eat foods, such as fruits and vegetables. Cook seafood, meat, poultry, and eggs to the recommended temperature. Get a food thermometer. Store foods at safe temperatures. In general: Keep cold foods at 34F (4.4C) or below. Keep hot foods at 134F (60C) or above. Keep your freezer at [facility] (-17.8C) or below. Foods are not safe to eat if they have been between the temperatures of 40-134F (4.4-60C) for more than 2 hours. What foods should I eat? Fruits Aim to eat 1-2 cups of fresh, canned (in natural juice), or frozen fruits each day. One cup of fruit equals 1 small apple, 1 large banana, 8 large strawberries, 1 cup (237 g) canned fruit,  cup (82 g) dried fruit,  or 1 cup (240 mL) 100% juice. Vegetables Aim to eat 2-4 cups of fresh and frozen vegetables each day, including different varieties and colors. One cup  of vegetables equals 1 cup (91 g) broccoli or cauliflower florets, 2 medium carrots, 2 cups (150 g) raw, leafy greens, 1 large tomato, 1 large bell pepper, 1 large sweet potato, or 1 medium white potato. Grains Aim to eat 5-10 ounce-equivalents of whole grains each day. Examples of 1 ounce-equivalent of grains include 1 slice of bread, 1 cup (40 g) ready-to-eat cereal, 3 cups (24 g) popcorn, or  cup (93 g) cooked rice. Meats and other proteins Try to eat 5-7 ounce-equivalents of protein each day. Examples of 1 ounce-equivalent of protein include 1 egg,  oz nuts (12 almonds, 24 pistachios, or 7 walnut halves), 1/4 cup (90 g) cooked beans, 6 tablespoons (90 g) hummus or 1 tablespoon (16 g) peanut butter. A cut of meat or fish that is the size of a deck of cards is about 3-4 ounce-equivalents (85 g). Of the protein you eat each week, try to have at least 8 sounce (227 g) of seafood. This is about 2 servings per week. This includes salmon, trout, herring, sardines, and anchovies. Dairy Aim to eat 3 cup-equivalents of fat-free or low-fat dairy each day. Examples of 1 cup-equivalent of dairy include 1 cup (240 mL) milk, 8 ounces (250 g) yogurt, 1 ounces (44 g) natural cheese, or 1 cup (240 mL) fortified soy milk. Fats and oils Aim for about 5 teaspoons (21 g) of fats and oils per day. Choose monounsaturated fats, such as canola and olive oils, mayonnaise made with olive oil or avocado oil, avocados, peanut butter, and most nuts, or polyunsaturated fats, such as sunflower, corn, and soybean oils, walnuts, pine nuts, sesame seeds, sunflower seeds, and flaxseed. Beverages Aim for 6 eight-ounce glasses of water  per day. Limit coffee to 3-5 eight-ounce cups per day. Limit caffeinated beverages that have added calories, such as soda and energy drinks. If you drink alcohol: Limit how much you have to: 0-1 drink a day if you are male. 0-2 drinks a day if you are male. Know how much alcohol is in your drink.  In the [place] one drink is one 12 oz bottle of beer (355 mL), one 5 oz glass of wine (148 mL), or one 1 oz glass of hard liquor (44 mL). Seasoning and other foods Try not to add too much salt to your food. Try using herbs and spices instead of salt. Try not to add sugar to food. This information is based on [place] nutrition guidelines. To learn more, visit [employer]. Exact amounts may vary. You may need different amounts. This information is not intended to replace advice given to you by your health care provider. Make sure you discuss any questions you have with your health care provider. Document Revised: 04/07/2022 Document Reviewed: 04/07/2022 Elsevier Patient Education  2024 [employer].

## 2023-07-29 NOTE — Progress Notes (Signed)
 MEDICARE ANNUAL WELLNESS VISIT AND OV  Assessment:   Annual Medicare Wellness Visit Due annually  Health maintenance reviewed Declines covid 19 booster and shingrix at this time, vaccines have triggered a. Fib in the past  Atherosclerosis of aorta (HCC) - Per CT 2015/Hyperlipidemia Discussed lifestyle modifications. Recommended diet heavy in fruits and veggies, omega 3's. Decrease consumption of animal meats, cheeses, and dairy products. Remain active and exercise as tolerated. Continue to monitor. Check lipids/TSH  Essential hypertension Discussed DASH (Dietary Approaches to Stop Hypertension) DASH diet is lower in sodium than a typical American diet. Cut back on foods that are high in saturated fat, cholesterol, and trans fats. Eat more whole-grain foods, fish, poultry, and nuts Remain active and exercise as tolerated daily.  Monitor BP at home-Call if greater than 130/80.  Check CMP/CBC  Paroxysmal atrial fibrillation (HCC) Rate controlled; numerous CVs and several ablations Continue follow up cardio Continue xarelto   CAD Control blood pressure, cholesterol (LDL <70), glucose, increase exercise.  Followed by cardiology No recent chest pain/nitroglycerine use  Peripheral arterial disease (HCC) No claudication sx, cardiology follows Control blood pressure, cholesterol, glucose, increase exercise.   Other abnormal glucose Education: Reviewed 'ABCs' of diabetes management  Discussed goals to be met and/or maintained include A1C (<7) Blood pressure (<130/80) Cholesterol (LDL <70) Continue Eye Exam yearly  Continue Dental Exam Q6 mo Discussed dietary recommendations Discussed Physical Activity recommendations Check A1C  Vitamin D  deficiency Continue supplement for goal of 60-100 Monitor Vitamin D  levels  Malignant neoplasm of urinary bladder, unspecified site [facility]) Continue close monitoring by urology  OSA on CPAP Sleep apnea- continue CPAP, weight loss  advised.   Medication management All medications discussed and reviewed in full. All questions and concerns regarding medications addressed.    Mitral regurgitation Mild last ECHO; Continue cardio follow up  Gastroesophageal reflux disease, esophagitis presence not specified Continue Prevacid PRN. No suspected reflux complications (Barret/stricture). Lifestyle modification:  wt loss, avoid meals 2-3h before bedtime. Consider eliminating food triggers:  chocolate, caffeine, EtOH, acid/spicy food.  Obesity - BMI 30 Discussed appropriate BMI Diet modification. Physical activity. Encouraged/praised to build confidence.  Insomnia Discussed good sleep hygiene. Establish bed and wake times. Sleep restriction-only sleep estimated hrs sleep. Bed only for sex and sleep, only sleep when sleepy, out of bed if anxious (stimulus control). Reviewed relaxation techniques, mindful meditations. Expected sleep duration. Addressed worries about not sleeping.   History of colon polyps Colonoscopy UTD 07/2022 Continue to monitor  Bilateral hearing loss High frequency only, doesn't want hearing aids at this time   Upper respiratory infection Covid negative Start Azithromycin  as directed. Stay well hydrated to keep mucus thin and productive. Contact office if s/s fail to improve.  Orders Placed This Encounter  Procedures   CBC with Differential/Platelet   COMPLETE METABOLIC PANEL WITH GFR   Lipid panel   Hemoglobin A1c   POC COVID-19    Previously tested for COVID-19:   No    Resident in a congregate (group) care setting:   No    Employed in healthcare setting:   No   Meds ordered this encounter  Medications   azithromycin  (ZITHROMAX ) 250 MG tablet    Sig: Take 2 tablets on  Day 1,  followed by 1 tablet  daily for 4 more days    for Sinusitis  /Bronchitis    Dispense:  6 each    Refill:  1    Supervising Provider:   TONITA FALLOW [phone]   Notify office for  further evaluation  and treatment, questions or concerns if any reported s/s fail to improve.   The patient was advised to call back or seek an in-person evaluation if any symptoms worsen or if the condition fails to improve as anticipated.   Further disposition pending results of labs. Discussed med's effects and SE's.    I discussed the assessment and treatment plan with the patient. The patient was provided an opportunity to ask questions and all were answered. The patient agreed with the plan and demonstrated an understanding of the instructions.  Discussed med's effects and SE's. Screening labs and tests as requested with regular follow-up as recommended.  I provided 35 minutes of face-to-face time during this encounter including counseling, chart review, and critical decision making was preformed.  Today's Plan of Care is based on a patient-centered health care approach known as shared decision making - the decisions, tests and treatments allow for patient preferences and values to be balanced with clinical evidence.    Future Appointments  Date Time Provider Department Center  10/27/2023 11:00 AM Tonita Fallow, MD [facility] None  01/12/2024  9:00 AM Nellene Bienenstock R, PA [id number] None  07/28/2024  2:00 PM Laurice President, NP [facility] None     Plan:   During the course of the visit the patient was educated and counseled about appropriate screening and preventive services including:   Pneumococcal vaccine  Influenza vaccine Td vaccine Screening electrocardiogram Bone densitometry screening Colorectal cancer screening Diabetes screening Glaucoma screening Nutrition counseling  Advanced directives: requested  Subjective:   Damon [name]  presents for Medicare Annual Wellness Visit and 3 month follow up for Hypertension, Hyperlipidemia, Pre-Diabetes and Vitamin D  Deficiency. He has Essential hypertension; Mitral regurgitation; Hyperlipidemia, mixed; Abnormal glucose; Vitamin D  deficiency; CAD-  CFX PTCA 11/24/13 ; Bladder cancer (HCC); Gastroesophageal reflux disease; PAD (peripheral artery disease) (HCC); OSA on CPAP; History of colon polyps; Hearing loss of both ears; Overweight (BMI 25.0-29.9); Insomnia; Paroxysmal atrial fibrillation (HCC); Aortic atherosclerosis (HCC) by Abd CT Scan in 2015; Persistent atrial fibrillation (HCC); and Hypercoagulable state due to persistent atrial fibrillation (HCC) on their problem list.  Overall he reports doing well today however has had lingering cough with congestion, sore throat, sinus drainage, HA x5 days.  Unresponsive to antihistamines.  Denies fever, chills, N/V.    Patient was taking xanax  for insomnia but was able to taper off, reports is sleeping better since getting off of the medication.   In Sept 2014 , Patient had TUR of a Bladder cancer by Dr Alvaro, follows annually for bladder scope without recurrence.   He has OSA and is on a CPAP, 100% compliance, reports restorative sleep -   BMI is Body mass index is 30.36 kg/m., he has been working on diet and exercise; typically golfs once a week, does do yardwork several hours Wt Readings from Last 3 Encounters:  07/29/23 205 lb 9.6 oz (93.3 kg)  07/14/23 203 lb 12.8 oz (92.4 kg)  06/08/23 204 lb 9.6 oz (92.8 kg)   Patient has HTN. Today's BP: 118/70.   Patient has ASCAD  S/p PCA/stenting followed by Rotator Ablation in 2015 (Dr Rolan).   Patient has had persistent recurrent Afib (on Xarelto ).  He has had 8 CV and 3 ablations, most recently had after colonoscopy in 09/2021. Last ablation 10/2022. Mild MR per ECHO 09/03/2018.   He has PAD, denies recent symptoms, improved with increased exercise. Aortic atherosclerosis per CT 2015. Patient has had no complaints of any cardiac  type chest pain, palpitations, dyspnea/orthopnea/PND, dizziness, claudication, or dependent edema.  Hyperlipidemia is controlled with diet & meds, he is on crestor  20 mg and zetia  10 mg for LDL <70. Patient denies myalgias  or other med SE's. Last Lipids were at goal -   Lab Results  Component Value Date   CHOL 131 04/16/2023   HDL 64 04/16/2023   LDLCALC 54 04/16/2023   TRIG 52 04/16/2023   CHOLHDL 2.0 04/16/2023   Also, the patient has history of PreDiabetes with A1c 5.7% in 2011 and 5.9% in 2013, and then 5.6% x 2 in late 2013 now controlled by diet.  He's had no symptoms of reactive hypoglycemia, diabetic polys, paresthesias or visual blurring.   Lab Results  Component Value Date   HGBA1C 5.8 (H) 04/16/2023    Last GFR:  Lab Results  Component Value Date   EGFR 87 04/16/2023   Further, the patient also has history of Vitamin D  Deficiency of 27 in 2008 and supplements vitamin D  without any suspected side-effects. Lab Results  Component Value Date   VD25OH 90 04/16/2023      Medication Review: Current Outpatient Medications on File Prior to Visit  Medication Sig   bisoprolol -hydrochlorothiazide  (ZIAC ) 10-6.25 MG tablet Take 0.5 tablets by mouth daily.   Cholecalciferol (VITAMIN D3) 125 MCG (5000 UT) CAPS Take 10,000 units Daily   diltiazem  (CARDIZEM  CD) 180 MG 24 hr capsule TAKE 1 CAPSULE BY MOUTH DAILY   diltiazem  (CARDIZEM ) 30 MG tablet Take 1 tablet every 4 hours AS NEEDED for AFIB heart rate >100 as long as top BP >100.   ezetimibe  (ZETIA ) 10 MG tablet TAKE 1 TABLET BY MOUTH DAILY FOR CHOLESTEROL   Multiple Vitamin (MULTIVITAMIN WITH MINERALS) TABS tablet Take 1 tablet Daily   nitroGLYCERIN  (NITROSTAT ) 0.4 MG SL tablet DISSOLVE ONE TABLET UNDER THE TONGUE EVERY 5 MINUTES AS NEEDED FOR CHEST PAIN.DO NOT EXCEED A TOTAL OF 3 DOSES IN 15 MINUTES   Polyvinyl Alcohol-Povidone (REFRESH OP) Place 1 drop into both eyes 3 (three) times daily as needed (dry/irritated eyes.).   rivaroxaban  (XARELTO ) 20 MG TABS tablet Take 1 tablet (20 mg total) by mouth daily with supper.   rosuvastatin  (CRESTOR ) 40 MG tablet TAKE ONE-HALF TABLET BY MOUTH  DAILY FOR CHOLESTEROL   No current facility-administered  medications on file prior to visit.    Current Problems (verified) Patient Active Problem List   Diagnosis Date Noted   Persistent atrial fibrillation (HCC) 07/30/2021   Hypercoagulable state due to persistent atrial fibrillation (HCC) 07/30/2021   Aortic atherosclerosis (HCC) by Abd CT Scan in 2015 05/16/2020   Paroxysmal atrial fibrillation (HCC)    Insomnia 03/23/2018   Overweight (BMI 25.0-29.9) 08/17/2017   PAD (peripheral artery disease) (HCC)    OSA on CPAP    History of colon polyps    Hearing loss of both ears    Bladder cancer (HCC) 02/16/2015   Gastroesophageal reflux disease 02/16/2015   CAD- CFX PTCA 11/24/13  11/25/2013   Hyperlipidemia, mixed 06/22/2013   Abnormal glucose 06/22/2013   Vitamin D  deficiency 06/22/2013   Essential hypertension    Mitral regurgitation     Screening Tests Immunization History  Administered Date(s) Administered   DT (Pediatric) 07/22/2003, 12/28/2013   Influenza Split 04/14/2019   Influenza Whole 05/19/2013   Influenza, High Dose Seasonal PF 04/08/2017, 05/17/2018, 04/14/2019, 05/28/2021, 04/22/2022, 04/16/2023   Influenza,inj,Quad PF,6+ Mos 05/09/2014   Influenza-Unspecified 05/14/2015, 05/01/2016, 05/17/2018   PFIZER(Purple Top)SARS-COV-2 Vaccination 08/18/2019, 09/15/2019  Pneumococcal Conjugate-13 07/11/2014   Pneumococcal Polysaccharide-23 12/04/2009, 06/10/2016   Tdap 09/22/2013   Health Maintenance  Topic Date Due   Zoster Vaccines- Shingrix (1 of 2) Never done   COVID-19 Vaccine (3 - Pfizer risk series) 10/13/2019   DTaP/Tdap/Td (4 - Td or Tdap) 12/29/2023   Medicare Annual Wellness (AWV)  01/13/2024   Colonoscopy  07/31/2025   Pneumonia Vaccine 73+ Years old  Completed   INFLUENZA VACCINE  Completed   Hepatitis C Screening  Completed   HPV VACCINES  Aged Out   Shingrix: declines  Covid 19: 2/2, 2021, pfizer, declining booster at this time, ? 2nd shot triggered a. Fib.   Last colonoscopy: 07/2022  Names of Other  Physician/Practitioners you currently use: 1. [facility] here for primary care 2. [facility], Dr. Jama, eye doctor, last visit 08/2021 3. ? Name of his dentist, dentist, last visit 2014, several years, no issues, encouraged routine screening and cleaning  Patient Care Team: Tonita Fallow, MD as PCP - General (Internal Medicine) Verlin Lonni BIRCH, MD as PCP - Cardiology (Cardiology) Inocencio Soyla Lunger, MD as PCP - Electrophysiology (Cardiology) Fernande Reyes CROME, MD as Referring Physician (Cardiology) Kelsie Agent, MD (Inactive) as Consulting Physician (Cardiology) Alvaro Ricardo KATHEE Raddle., MD as Consulting Physician (Urology) Vernetta Lonni GRADE, MD as Consulting Physician (Orthopedic Surgery) Andra Prentice Dunnings, MD as Referring Physician (Urology) Vicci Lunger POUR, MD as Consulting Physician (Gastroenterology)   Allergies No Known Allergies  SURGICAL HISTORY He  has a past surgical history that includes Total hip arthroplasty (07/25/2011); TEE without cardioversion (07/26/2012); Cardioversion (08/06/2012); Total hip arthroplasty (Left, 02/08/2010); Cataract extraction w/ intraocular lens  implant, bilateral; Cardiac electrophysiology study and ablation (07-27-2012  DR ALLRED); Tonsillectomy (age 65); Cystoscopy w/ retrogrades (Bilateral, 04/12/2013); Transurethral resection of bladder tumor with gyrus (turbt-gyrus) (N/A, 04/12/2013); atrial fibrillation ablation (N/A, 07/27/2012); left heart catheterization with coronary angiogram (N/A, 11/24/2013); percutaneous coronary stent intervention (pci-s) (11/24/2013); percutaneous coronary rotoblator intervention (pci-r) (N/A, 11/28/2013); TEE without cardioversion (N/A, 08/25/2014); Cardioversion (N/A, 08/25/2014); Cardioversion (N/A, 11/13/2014); Cardioversion (N/A, 12/28/2014); Cardiac catheterization (N/A, 01/26/2015); TEE without cardioversion (01/26/2015); Ablation of dysrhythmic focus  (01/26/2015); Colonoscopy with propofol  (N/A, 02/13/2015); Cardioversion (N/A, 01/30/2017); Cardioversion (N/A, 04/06/2017); Cardioversion (N/A, 05/25/2018); Cardioversion (N/A, 08/09/2018); ATRIAL FIBRILLATION ABLATION (N/A, 08/25/2018); TEE without cardioversion (N/A, 07/04/2019); Cardioversion (N/A, 07/04/2019); Cardioversion (N/A, 09/23/2019); Joint replacement; Cystoscopy with retrograde pyelogram, ureteroscopy and stent placement (03/06/2021); Holmium laser application (Left, 03/06/2021); Cardioversion (N/A, 08/07/2021); Cardioversion (N/A, 03/17/2022); Cardioversion (N/A, 06/26/2022); ATRIAL FIBRILLATION ABLATION (N/A, 10/09/2022); Cardioversion (N/A, 11/10/2022); and Cardioversion (N/A, 03/05/2023). FAMILY HISTORY His family history includes Heart attack in his mother; Heart disease in his father and mother; Heart failure in his father. SOCIAL HISTORY He  reports that he quit smoking about 41 years ago. His smoking use included cigarettes. He started smoking about 51 years ago. He has a 20 pack-year smoking history. He has never used smokeless tobacco. He reports that he does not drink alcohol and does not use drugs.  MEDICARE WELLNESS OBJECTIVES: Physical activity:   Cardiac risk factors:   Depression/mood screen:      04/16/2023   12:00 AM  Depression screen PHQ 2/9  Decreased Interest 0  Down, Depressed, Hopeless 0  PHQ - 2 Score 0    ADLs:     04/19/2023    6:49 PM 03/05/2023    7:31 AM  In your present state of health, do you have any difficulty performing the following activities:  Hearing? 0 0  Vision?  0 0  Difficulty concentrating or making decisions? 0 0  Walking or climbing stairs? 0 0  Dressing or bathing? 0 0  Doing errands, shopping? 0      Cognitive Testing  Alert? Yes  Normal Appearance?Yes  Oriented to person? Yes  Place? Yes   Time? Yes  Recall of three objects?  Yes  Can perform simple calculations? Yes  Displays appropriate judgment?Yes  Can read the correct  time from a watch face?Yes  EOL planning:      Objective:     Blood pressure 118/70, pulse 67, temperature 97.8 F (36.6 C), height 5' 9 (1.753 m), weight 205 lb 9.6 oz (93.3 kg), SpO2 99%.   General Appearance:  Alert  WD/WN, male  in no apparent distress. Eyes: PERRLA, EOMs nl, conjunctiva normal Sinuses: No frontal/maxillary tenderness ENT/Mouth: EACs patent / TMs  nl. Nares clear without erythema, swelling, mucoid exudates. Oral hygiene is good. No erythema, swelling, or exudate. Tongue normal, non-obstructing. Tonsils not swollen or erythematous. Not notably HOH today with mask on.  Neck: Supple, thyroid normal. No bruits, nodes or JVD. Respiratory: Respiratory effort normal.  BS equal and clear bilateral without rales, rhonci, wheezing or stridor. Cardio: Heart sounds are normal with regular rate and rhythm and no murmurs, rubs or gallops. Peripheral pulses are normal and equal bilaterally without edema. No aortic or femoral bruits. Chest: symmetric with normal excursions and percussion.  Abdomen: Flat, soft, with nl bowel sounds. No guarding, rebound, hernias, masses, or organomegaly. Mild suprapubic tenderness Lymphatics: Non tender without lymphadenopathy.  Musculoskeletal: Full ROM all peripheral extremities, joint stability, 5/5 strength, and normal gait. Patient is able to ambulate well. Gait is not  Antalgic. He has left heel pain, over fat pad area, no anterior heel tenderness.  Neuro: Cranial nerves intact, reflexes equal bilaterally. Normal muscle tone, no cerebellar symptoms. Sensation intact.  Pysch: Alert and oriented X 3 with normal affect, insight and judgment appropriate.    Medicare Attestation I have personally reviewed: The patient's medical and social history Their use of alcohol, tobacco or illicit drugs Their current medications and supplements The patient's functional ability including ADLs,fall risks, home safety risks, cognitive, and hearing and visual  impairment Diet and physical activities Evidence for depression or mood disorders  The patient's weight, height, BMI, and visual acuity have been recorded in the chart.  I have made referrals, counseling, and provided education to the patient based on review of the above and I have provided the patient with a written personalized care plan for preventive services.  Over 40 minutes of exam, counseling, chart review was performed.  BASCOM NECESSARY, NP   07/29/2023

## 2023-07-30 LAB — COMPLETE METABOLIC PANEL WITH GFR
AG Ratio: 1.8 (calc) (ref 1.0–2.5)
ALT: 19 U/L (ref 9–46)
AST: 21 U/L (ref 10–35)
Albumin: 4.2 g/dL (ref 3.6–5.1)
Alkaline phosphatase (APISO): 57 U/L (ref 35–144)
BUN: 11 mg/dL (ref 7–25)
CO2: 28 mmol/L (ref 20–32)
Calcium: 9.4 mg/dL (ref 8.6–10.3)
Chloride: 104 mmol/L (ref 98–110)
Creat: 0.81 mg/dL (ref 0.70–1.28)
Globulin: 2.3 g/dL (ref 1.9–3.7)
Glucose, Bld: 85 mg/dL (ref 65–99)
Potassium: 4.1 mmol/L (ref 3.5–5.3)
Sodium: 141 mmol/L (ref 135–146)
Total Bilirubin: 1.7 mg/dL — ABNORMAL HIGH (ref 0.2–1.2)
Total Protein: 6.5 g/dL (ref 6.1–8.1)
eGFR: 90 mL/min/{1.73_m2} (ref >=60)

## 2023-07-30 LAB — CBC WITH DIFFERENTIAL/PLATELET
Absolute Lymphocytes: 1792 {cells}/uL (ref 850–3900)
Absolute Monocytes: 763 {cells}/uL (ref 200–950)
Basophils Absolute: 49 {cells}/uL (ref 0–200)
Basophils Relative: 0.7 %
Eosinophils Absolute: 259 {cells}/uL (ref 15–500)
Eosinophils Relative: 3.7 %
HCT: 45 % (ref 38.5–50.0)
Hemoglobin: 15.2 g/dL (ref 13.2–17.1)
MCH: 30.8 pg (ref 27.0–33.0)
MCHC: 33.8 g/dL (ref 32.0–36.0)
MCV: 91.3 fL (ref 80.0–100.0)
MPV: 9.7 fL (ref 7.5–12.5)
Monocytes Relative: 10.9 %
Neutro Abs: 4137 {cells}/uL (ref 1500–7800)
Neutrophils Relative %: 59.1 %
Platelets: 201 10*3/uL (ref 140–400)
RBC: 4.93 10*6/uL (ref 4.20–5.80)
RDW: 12 % (ref 11.0–15.0)
Total Lymphocyte: 25.6 %
WBC: 7 10*3/uL (ref 3.8–10.8)

## 2023-07-30 LAB — LIPID PANEL
Cholesterol: 126 mg/dL (ref <200)
HDL: 55 mg/dL (ref >=40)
LDL Cholesterol (Calc): 55 mg/dL
Non-HDL Cholesterol (Calc): 71 mg/dL (ref <130)
Total CHOL/HDL Ratio: 2.3 (calc) (ref <5.0)
Triglycerides: 80 mg/dL (ref <150)

## 2023-07-30 LAB — HEMOGLOBIN A1C
Hgb A1c MFr Bld: 5.9 %{Hb} — ABNORMAL HIGH (ref <5.7)
Mean Plasma Glucose: 123 mg/dL
eAG (mmol/L): 6.8 mmol/L

## 2023-08-05 ENCOUNTER — Other Ambulatory Visit: Payer: Self-pay

## 2023-08-05 DIAGNOSIS — I480 Paroxysmal atrial fibrillation: Secondary | ICD-10-CM

## 2023-08-05 DIAGNOSIS — I10 Essential (primary) hypertension: Secondary | ICD-10-CM

## 2023-08-05 MED ORDER — DILTIAZEM HCL ER COATED BEADS 180 MG PO CP24: 180.0000 mg | ORAL_CAPSULE | Freq: Every day | ORAL | 3 refills | Status: DC

## 2023-08-06 ENCOUNTER — Ambulatory Visit: Payer: Medicare Other | Admitting: Nurse Practitioner

## 2023-10-07 ENCOUNTER — Other Ambulatory Visit: Payer: Self-pay

## 2023-10-07 DIAGNOSIS — I2510 Atherosclerotic heart disease of native coronary artery without angina pectoris: Secondary | ICD-10-CM

## 2023-10-07 DIAGNOSIS — I10 Essential (primary) hypertension: Secondary | ICD-10-CM

## 2023-10-07 MED ORDER — BISOPROLOL-HYDROCHLOROTHIAZIDE 10-6.25 MG PO TABS: 0.5000 | ORAL_TABLET | Freq: Every day | ORAL | 0 refills | Status: DC

## 2023-10-12 ENCOUNTER — Encounter: Payer: Medicare Other | Admitting: Internal Medicine

## 2023-10-21 ENCOUNTER — Encounter: Payer: Medicare Other | Admitting: Internal Medicine

## 2023-10-27 ENCOUNTER — Encounter: Payer: Medicare Other | Admitting: Internal Medicine

## 2023-11-17 ENCOUNTER — Encounter
Admission: RE | Disposition: A | Discharge status: Home / Self Care | Payer: Self-pay | Source: Home / Self Care | Attending: Internal Medicine

## 2023-11-17 ENCOUNTER — Ambulatory Visit: Admitting: Anesthesiology

## 2023-11-17 ENCOUNTER — Ambulatory Visit
Admission: RE | Admit: 2023-11-17 | Discharge: 2023-11-17 | Disposition: A | Discharge status: Home / Self Care | Attending: Internal Medicine | Admitting: Internal Medicine

## 2023-11-17 ENCOUNTER — Encounter: Payer: Self-pay | Admitting: Internal Medicine

## 2023-11-17 ENCOUNTER — Ambulatory Visit (HOSPITAL_COMMUNITY): Admitting: Physician Assistant

## 2023-11-17 DIAGNOSIS — K219 Gastro-esophageal reflux disease without esophagitis: Secondary | ICD-10-CM | POA: Diagnosis not present

## 2023-11-17 DIAGNOSIS — I480 Paroxysmal atrial fibrillation: Secondary | ICD-10-CM | POA: Diagnosis present

## 2023-11-17 DIAGNOSIS — I10 Essential (primary) hypertension: Secondary | ICD-10-CM | POA: Diagnosis not present

## 2023-11-17 DIAGNOSIS — I739 Peripheral vascular disease, unspecified: Secondary | ICD-10-CM | POA: Diagnosis not present

## 2023-11-17 DIAGNOSIS — G4730 Sleep apnea, unspecified: Secondary | ICD-10-CM | POA: Insufficient documentation

## 2023-11-17 DIAGNOSIS — Z87891 Personal history of nicotine dependence: Secondary | ICD-10-CM | POA: Insufficient documentation

## 2023-11-17 DIAGNOSIS — E785 Hyperlipidemia, unspecified: Secondary | ICD-10-CM | POA: Diagnosis not present

## 2023-11-17 DIAGNOSIS — G4733 Obstructive sleep apnea (adult) (pediatric): Secondary | ICD-10-CM | POA: Diagnosis not present

## 2023-11-17 DIAGNOSIS — I25119 Atherosclerotic heart disease of native coronary artery with unspecified angina pectoris: Secondary | ICD-10-CM | POA: Insufficient documentation

## 2023-11-17 DIAGNOSIS — I4891 Unspecified atrial fibrillation: Secondary | ICD-10-CM | POA: Diagnosis not present

## 2023-11-17 DIAGNOSIS — I4819 Other persistent atrial fibrillation: Secondary | ICD-10-CM

## 2023-11-17 HISTORY — PX: CARDIOVERSION: SHX1299

## 2023-11-17 SURGERY — CARDIOVERSION
Anesthesia: General

## 2023-11-17 MED ORDER — SODIUM CHLORIDE 0.9 % IV SOLN: INTRAVENOUS | Status: DC

## 2023-11-17 MED ORDER — PROPOFOL 1000 MG/100ML IV EMUL
INTRAVENOUS | Status: AC
  Filled 2023-11-17: qty 200

## 2023-11-17 MED ORDER — LIDOCAINE HCL (CARDIAC) PF 100 MG/5ML IV SOSY
PREFILLED_SYRINGE | INTRAVENOUS | Status: DC | PRN
  Administered 2023-11-17: 100 mg via INTRATRACHEAL

## 2023-11-17 MED ORDER — PROPOFOL 10 MG/ML IV BOLUS
INTRAVENOUS | Status: DC | PRN
  Administered 2023-11-17: 80 mg via INTRAVENOUS

## 2023-11-17 NOTE — Anesthesia Preprocedure Evaluation (Signed)
 Anesthesia Evaluation  Patient identified by MRN, date of birth, ID band Patient awake    Reviewed: Allergy & Precautions, NPO status , Patient's Chart, lab work & pertinent test results  History of Anesthesia Complications Negative for: history of anesthetic complications  Airway Mallampati: III  TM Distance: <3 FB Neck ROM: full    Dental  (+) Chipped   Pulmonary neg shortness of breath, sleep apnea , former smoker   Pulmonary exam normal        Cardiovascular Exercise Tolerance: Good hypertension, + angina  + CAD  + dysrhythmias Atrial Fibrillation  Rhythm:irregular Rate:Normal     Neuro/Psych negative neurological ROS  negative psych ROS   GI/Hepatic Neg liver ROS,GERD  Controlled,,  Endo/Other  negative endocrine ROS    Renal/GU negative Renal ROS  negative genitourinary   Musculoskeletal   Abdominal   Peds  Hematology negative hematology ROS (+)   Anesthesia Other Findings Past Medical History: No date: Bladder cancer (HCC) No date: Hearing loss of both ears No date: History of colon polyps     Comment:  BENIGN No date: Hyperlipidemia No date: Hypertension No date: Mitral regurgitation No date: OSA on CPAP No date: PAD (peripheral artery disease) (HCC)     Comment:  ABI--  RIGHT SIDE NORMAL LEFT SIDE MODERATELY REDUCED W/              DISTAL LEFT SFA STENOSIS//  MILD CALDICATION  : PAF (paroxysmal atrial fibrillation) (HCC)     Comment:  a. s/p PVI 2014; b. s/p redo PVI and CTI 01/2015 No date: Vitamin D  deficiency  Past Surgical History: 01/26/2015: ABLATION OF DYSRHYTHMIC FOCUS 07/27/2012: ATRIAL FIBRILLATION ABLATION; N/A     Comment:  Procedure: ATRIAL FIBRILLATION ABLATION;  Surgeon: Jolly Needle, MD;  Location: [facility] CATH [facility];  Service:               Cardiovascular;  Laterality: N/A; 08/25/2018: ATRIAL FIBRILLATION ABLATION; N/A     Comment:  Procedure: ATRIAL FIBRILLATION  ABLATION;  Surgeon:               Jolly Needle, MD;  Location: [facility] INVASIVE CV LAB;                Service: Cardiovascular;  Laterality: N/A; 10/09/2022: ATRIAL FIBRILLATION ABLATION; N/A     Comment:  Procedure: ATRIAL FIBRILLATION ABLATION;  Surgeon:               Lei Pump, MD;  Location: [facility] INVASIVE CV [facility];               Service: Cardiovascular;  Laterality: N/A; 07-27-2012  DR Nunzio Belch: CARDIAC ELECTROPHYSIOLOGY STUDY AND ABLATION     Comment:  SUCCESSFUL ABLATION OF A-FIB 08/06/2012: CARDIOVERSION     Comment:  Procedure: CARDIOVERSION;  Surgeon: Elmyra Haggard, MD;                Location: [facility] ENDOSCOPY;  Service: Cardiovascular;                Laterality: N/A; 08/25/2014: CARDIOVERSION; N/A     Comment:  Procedure: CARDIOVERSION;  Surgeon: Darlis Eisenmenger, MD;              Location: [facility] ENDOSCOPY;  Service: Cardiovascular;                Laterality: N/A; 11/13/2014: CARDIOVERSION; N/A     Comment:  Procedure: CARDIOVERSION;  Surgeon: Jacqueline Matsu, MD;               Location: [facility] ENDOSCOPY;  Service: Cardiovascular;                Laterality: N/A; 12/28/2014: CARDIOVERSION; N/A     Comment:  Procedure: CARDIOVERSION;  Surgeon: Elmyra Haggard, MD;                Location: [facility] ENDOSCOPY;  Service: Cardiovascular;                Laterality: N/A; 01/30/2017: CARDIOVERSION; N/A     Comment:  Procedure: CARDIOVERSION;  Surgeon: Loyde Rule, MD;              Location: [facility] ENDOSCOPY;  Service: Cardiovascular;                Laterality: N/A; 04/06/2017: CARDIOVERSION; N/A     Comment:  Procedure: CARDIOVERSION;  Surgeon: Liza Riggers,               MD;  Location: [facility] ENDOSCOPY;  Service: Cardiovascular;                Laterality: N/A; 05/25/2018: CARDIOVERSION; N/A     Comment:  Procedure: CARDIOVERSION;  Surgeon: Maudine Sos,               MD;  Location: [facility] ENDOSCOPY;  Service: Cardiovascular;                Laterality: N/A; 08/09/2018: CARDIOVERSION; N/A      Comment:  Procedure: CARDIOVERSION;  Surgeon: Sheryle Donning, MD;  Location: [facility] ENDOSCOPY;  Service:               Cardiovascular;  Laterality: N/A; 07/04/2019: CARDIOVERSION; N/A     Comment:  Procedure: CARDIOVERSION;  Surgeon: Liza Riggers,               MD;  Location: [facility] ENDOSCOPY;  Service: Cardiovascular;                Laterality: N/A; 09/23/2019: CARDIOVERSION; N/A     Comment:  Procedure: CARDIOVERSION;  Surgeon: Liza Riggers,               MD;  Location: [facility] ENDOSCOPY;  Service: Cardiovascular;                Laterality: N/A; 08/07/2021: CARDIOVERSION; N/A     Comment:  Procedure: CARDIOVERSION;  Surgeon: Jacqueline Matsu, MD;              Location: [facility] ENDOSCOPY;  Service: Cardiovascular;                Laterality: N/A; 03/17/2022: CARDIOVERSION; N/A     Comment:  Procedure: CARDIOVERSION;  Surgeon: Jacqueline Matsu, MD;              Location: [facility] ENDOSCOPY;  Service: Cardiovascular;                Laterality: N/A; 06/26/2022: CARDIOVERSION; N/A     Comment:  Procedure: CARDIOVERSION;  Surgeon: Hazle Lites,               MD;  Location: [facility] ENDOSCOPY;  Service: Cardiovascular;                Laterality: N/A; 11/10/2022: CARDIOVERSION; N/A     Comment:  Procedure: CARDIOVERSION;  Surgeon: Alois Arnt  E, MD;               Location: [facility] INVASIVE CV LAB;  Service: Cardiovascular;                Laterality: N/A; 03/05/2023: CARDIOVERSION; N/A     Comment:  Procedure: CARDIOVERSION;  Surgeon: Antonette Batters,               MD;  Location: [facility] ORS;  Service: Cardiovascular;                Laterality: N/A; No date: CATARACT EXTRACTION W/ INTRAOCULAR LENS  IMPLANT, BILATERAL 02/13/2015: COLONOSCOPY WITH PROPOFOL ; N/A     Comment:  Procedure: COLONOSCOPY WITH PROPOFOL ;  Surgeon: Garrett Kallman, MD;  Location: [facility] ENDOSCOPY;  Service:               Endoscopy;  Laterality: N/A; 04/12/2013: CYSTOSCOPY W/ RETROGRADES; Bilateral      Comment:  Procedure: CYSTOSCOPY WITH RETROGRADE PYELOGRAM;                Surgeon: Osborn Blaze, MD;  Location: [facility];  Service: Urology;  Laterality:               Bilateral; 03/06/2021: CYSTOSCOPY WITH RETROGRADE PYELOGRAM, URETEROSCOPY AND  STENT PLACEMENT     Comment:  Procedure: CYSTOSCOPY WITH BILATERAL RETROGRADE               PYELOGRAM, LEFT URETEROSCOPY AND STENT PLACEMENT;                Surgeon: Osborn Blaze, MD;  Location: [facility];  Service: Urology;;  1 HR 01/26/2015: ELECTROPHYSIOLOGIC STUDY; N/A     Comment:  Procedure: Atrial Fibrillation Ablation;  Surgeon: Jolly Needle, MD;  Location: [facility] INVASIVE CV LAB;  Service:               Cardiovascular;  Laterality: N/A; 03/06/2021: HOLMIUM LASER APPLICATION; Left     Comment:  Procedure: HOLMIUM LASER APPLICATION;  Surgeon: Osborn Blaze, MD;  Location: [facility];                Service: Urology;  Laterality: Left; No date: JOINT REPLACEMENT 11/24/2013: LEFT HEART CATHETERIZATION WITH CORONARY ANGIOGRAM; N/A     Comment:  Procedure: LEFT HEART CATHETERIZATION WITH CORONARY               ANGIOGRAM;  Surgeon: Odie Benne, MD;                Location: [facility] CATH LAB;  Service: Cardiovascular;                Laterality: N/A; 11/28/2013: PERCUTANEOUS CORONARY ROTOBLATOR INTERVENTION (PCI-R); N/A     Comment:  Procedure: PERCUTANEOUS CORONARY ROTOBLATOR INTERVENTION              (PCI-R);  Surgeon: Odie Benne, MD;  Location:              [facility] CATH LAB;  Service: Cardiovascular;  Laterality: N/A; 11/24/2013: PERCUTANEOUS CORONARY STENT INTERVENTION (PCI-S)  Comment:  Procedure: PERCUTANEOUS CORONARY STENT INTERVENTION               (PCI-S);  Surgeon: Odie Benne, MD;  Location:              [facility] CATH LAB;  Service: Cardiovascular;; 07/26/2012: TEE WITHOUT CARDIOVERSION     Comment:   Procedure: TRANSESOPHAGEAL ECHOCARDIOGRAM (TEE);                Surgeon: Elmyra Haggard, MD;  Location: [facility] ENDOSCOPY;                Service: Cardiovascular;  Laterality: N/A; 08/25/2014: TEE WITHOUT CARDIOVERSION; N/A     Comment:  Procedure: TRANSESOPHAGEAL ECHOCARDIOGRAM (TEE);                Surgeon: Darlis Eisenmenger, MD;  Location: [facility] ENDOSCOPY;                Service: Cardiovascular;  Laterality: N/A; 01/26/2015: TEE WITHOUT CARDIOVERSION     Comment:  Procedure: Transesophageal Echocardiogram (Tee);                Surgeon: Lake Pilgrim, MD;  Location: [facility] INVASIVE CV               LAB;  Service: Cardiovascular;; 07/04/2019: TEE WITHOUT CARDIOVERSION; N/A     Comment:  Procedure: TRANSESOPHAGEAL ECHOCARDIOGRAM (TEE);                Surgeon: Liza Riggers, MD;  Location: [facility] ENDOSCOPY;              Service: Cardiovascular;  Laterality: N/A; age 78: TONSILLECTOMY 07/25/2011: TOTAL HIP ARTHROPLASTY     Comment:  Procedure: TOTAL HIP ARTHROPLASTY ANTERIOR APPROACH;                Surgeon: Arnie Lao;  Location: [facility] ORS;                Service: Orthopedics;  Laterality: Right; 02/08/2010: TOTAL HIP ARTHROPLASTY; Left 04/12/2013: TRANSURETHRAL RESECTION OF BLADDER TUMOR WITH GYRUS  (TURBT-GYRUS); N/A     Comment:  Procedure: TRANSURETHRAL RESECTION OF BLADDER TUMOR WITH              GYRUS (TURBT-GYRUS);  Surgeon: Osborn Blaze, MD;                Location: [facility];  Service: Urology;               Laterality: N/A;  BMI    Body Mass Index: 29.09 kg/m      Reproductive/Obstetrics negative OB ROS                             Anesthesia Physical Anesthesia Plan  ASA: 3  Anesthesia Plan: General   Post-op Pain Management:    Induction: Intravenous  PONV Risk Score and Plan: Propofol  infusion and TIVA  Airway Management Planned: Natural Airway and Nasal Cannula  Additional Equipment:   Intra-op Plan:    Post-operative Plan:   Informed Consent: I have reviewed the patients History and Physical, chart, labs and discussed the procedure including the risks, benefits and alternatives for the proposed anesthesia with the patient or authorized representative who has indicated his/her understanding and acceptance.     Dental Advisory Given  Plan Discussed with: Anesthesiologist, CRNA and Surgeon  Anesthesia Plan Comments: (Patient consented for risks of anesthesia including but not limited  to:  - adverse reactions to medications - risk of airway placement if required - damage to eyes, teeth, lips or other oral mucosa - nerve damage due to positioning  - sore throat or hoarseness - Damage to heart, brain, nerves, lungs, other parts of body or loss of life  Patient voiced understanding and assent.)       Anesthesia Quick Evaluation

## 2023-11-17 NOTE — Anesthesia Procedure Notes (Signed)
 Procedure Name: General with mask airway Date/Time: 11/17/2023 7:37 AM  Performed by: Niki Barter, CRNAPre-anesthesia Checklist: Patient identified, Emergency Drugs available, Suction available and Patient being monitored Patient Re-evaluated:Patient Re-evaluated prior to induction Oxygen Delivery Method: Simple face mask Induction Type: IV induction Placement Confirmation: positive ETCO2 and breath sounds checked- equal and bilateral Dental Injury: Teeth and Oropharynx as per pre-operative assessment

## 2023-11-17 NOTE — CV Procedure (Signed)
 Electrical Cardioversion Procedure Note   Procedure: Electrical Cardioversion Indications:  Atrial Fibrillation  Procedure Details Consent: Risks of procedure as well as the alternatives and risks of each were explained to the (patient/caregiver).  Consent for procedure obtained. Time Out: Verified patient identification, verified procedure, site/side was marked, verified correct patient position, special equipment/implants available, medications/allergies/relevent history reviewed, required imaging and test results available.  Performed  Patient placed on cardiac monitor, pulse oximetry, supplemental oxygen as necessary.  Sedation given: Propofol  as per anesthesia Pacer pads placed anterior and posterior chest.  Cardioverted 1 time(s).  Cardioverted at 200J.  Evaluation Findings: Post procedure EKG shows: NSR Complications: None Patient did tolerate procedure well.   Burney Carter MD 11/17/23 7:50am

## 2023-11-17 NOTE — Transfer of Care (Signed)
 Immediate Anesthesia Transfer of Care Note  Patient: Damon [name]  Procedure(s) Performed: CARDIOVERSION  Patient Location:  [facility]   Anesthesia Type:General  Level of Consciousness: drowsy and patient cooperative  Airway & Oxygen Therapy: Patient Spontanous Breathing and Patient connected to face mask oxygen  Post-op Assessment: Report given to RN and Post -op Vital signs reviewed and stable  Post vital signs: Reviewed and stable  Last Vitals:  Vitals Value Taken Time  BP 103/72 11/17/23 0755  Temp    Pulse 54 11/17/23 0758  Resp 19 11/17/23 0758  SpO2 98 % 11/17/23 0758  Vitals shown include unfiled device data.  Last Pain:  Vitals:   11/17/23 0708  TempSrc: Oral  PainSc: 0-No pain         Complications: No notable events documented.

## 2023-11-17 NOTE — Anesthesia Postprocedure Evaluation (Signed)
 Anesthesia Post Note  Patient: Damon [name]  Procedure(s) Performed: CARDIOVERSION  Patient location during evaluation: [facility] Anesthesia Type: General Level of consciousness: awake and alert Pain management: pain level controlled Vital Signs Assessment: post-procedure vital signs reviewed and stable Respiratory status: spontaneous breathing, nonlabored ventilation, respiratory function stable and patient connected to nasal cannula oxygen Cardiovascular status: blood pressure returned to baseline and stable Postop Assessment: no apparent nausea or vomiting Anesthetic complications: no   No notable events documented.   Last Vitals:  Vitals:   11/17/23 0815 11/17/23 0830  BP: 114/74 116/74  Pulse: (!) 53 (!) 53  Resp: 14 14  Temp:    SpO2: 93% 95%    Last Pain:  Vitals:   11/17/23 0830  TempSrc:   PainSc: 0-No pain                 Portia Brittle Jinnie Onley

## 2023-11-18 ENCOUNTER — Encounter: Payer: Self-pay | Admitting: Internal Medicine

## 2023-11-23 ENCOUNTER — Encounter: Payer: Self-pay | Admitting: Family Medicine

## 2023-11-23 ENCOUNTER — Other Ambulatory Visit: Payer: Self-pay | Admitting: Cardiology

## 2023-11-23 ENCOUNTER — Ambulatory Visit: Payer: Medicare Other | Admitting: Family Medicine

## 2023-11-23 VITALS — BP 137/83 | HR 60 | Ht 69.0 in | Wt 198.1 lb

## 2023-11-23 DIAGNOSIS — I4819 Other persistent atrial fibrillation: Secondary | ICD-10-CM | POA: Diagnosis not present

## 2023-11-23 DIAGNOSIS — Z7689 Persons encountering health services in other specified circumstances: Secondary | ICD-10-CM

## 2023-11-23 DIAGNOSIS — E782 Mixed hyperlipidemia: Secondary | ICD-10-CM

## 2023-11-23 DIAGNOSIS — D6869 Other thrombophilia: Secondary | ICD-10-CM

## 2023-11-23 NOTE — Assessment & Plan Note (Signed)
 Continue Ziac , diltiazem , Xarelto .

## 2023-11-23 NOTE — Assessment & Plan Note (Signed)
 Continue Xarelto

## 2023-11-23 NOTE — Progress Notes (Unsigned)
 New Patient Office Visit  Subjective   Patient ID: LASZLO FORNWALT, male    DOB: 1946-01-26  Age: 78 y.o. MRN: [id number]  CC:  Chief Complaint  Patient presents with   New Patient (Initial Visit)    HPI DARRINGTON HOHLT presents to establish care  Subjective - 78 year old male established patient, previously seen by Dr. 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 - due for 48-month follow-up CT scan, not yet scheduled - Bilateral hip replacements - One cardiac stent - Colonoscopy approximately January 2024, due for repeat in 2 years due to polyps  Medications: Ziac  for hypertension, Diltiazem  180mg  daily (time release) with 30mg  tablets PRN for heart rate >100, Xarelto , Zetia , Crestor  for cholesterol, nitroglycerin  tablets PRN (has not needed to use)  PMH: Atrial fibrillation, CAD with stent placement, peripheral vascular disease, sleep apnea, hyperlipidemia, hypertension, history of bladder cancer, chronically elevated bilirubin (possible Gilbert syndrome) PSH: Bilateral hip replacements, bladder tumor resection, TEE, cardiac stent placement FH: No significant family history mentioned Social Hx: Former smoker, quit in 1983. Minimal alcohol - shares one margarita weekly with wife. Retired from Animal nutritionist. Has son (12), daughter (64), and one grandson (6).  ROS: Denies chest pain requiring nitroglycerin  use. Reports A1C slightly elevated at 5.7-5.9 recently.    Outpatient Encounter Medications as of 11/23/2023  Medication Sig   bisoprolol -hydrochlorothiazide  (ZIAC ) 10-6.25 MG tablet Take 0.5 tablets by mouth daily.   Cholecalciferol (VITAMIN D3) 125 MCG (5000 UT) CAPS Take 10,000 units Daily (Patient taking differently: 5,000 Units. Take 10,000  units Daily)   diltiazem  (CARDIZEM  CD) 180 MG 24 hr capsule Take 1 capsule (180 mg total) by mouth daily.   diltiazem  (CARDIZEM ) 30 MG tablet Take 1 tablet every 4 hours AS NEEDED for AFIB heart rate >100 as long as top BP >100.   ezetimibe  (ZETIA ) 10 MG tablet TAKE 1 TABLET BY MOUTH DAILY FOR CHOLESTEROL   Multiple Vitamin (MULTIVITAMIN WITH MINERALS) TABS tablet Take 1 tablet Daily   nitroGLYCERIN  (NITROSTAT ) 0.4 MG SL tablet DISSOLVE ONE TABLET UNDER THE TONGUE EVERY 5 MINUTES AS NEEDED FOR CHEST PAIN.DO NOT EXCEED A TOTAL OF 3 DOSES IN 15 MINUTES   NON FORMULARY Pt uses a c-pap nightly   Polyvinyl Alcohol-Povidone (REFRESH OP) Place 1 drop into both eyes 3 (three) times daily as needed (dry/irritated eyes.).   rosuvastatin  (CRESTOR ) 40 MG tablet TAKE ONE-HALF TABLET BY MOUTH  DAILY FOR CHOLESTEROL   [DISCONTINUED] rivaroxaban  (XARELTO ) 20 MG TABS tablet Take 1 tablet (20 mg total) by mouth daily with supper.   azithromycin  (ZITHROMAX ) 250 MG tablet Take 2 tablets on  Day 1,  followed by 1 tablet  daily for 4 more days    for Sinusitis  /Bronchitis (Patient not taking: Reported on 11/11/2023)   No facility-administered encounter medications on file as of 11/23/2023.    Past Medical History:  Diagnosis Date   Bladder cancer (HCC)    Hearing loss of both ears    History of colon polyps    BENIGN   Hyperlipidemia    Hypertension    Mitral regurgitation    OSA on CPAP    PAD (peripheral artery disease) (HCC)    ABI--  RIGHT SIDE NORMAL LEFT SIDE MODERATELY REDUCED W/ DISTAL LEFT SFA STENOSIS//  MILD CALDICATION   PAF (paroxysmal atrial fibrillation) (HCC)     a. s/p PVI 2014; b. s/p redo PVI and CTI 01/2015   Vitamin D  deficiency     Past Surgical History:  Procedure Laterality Date   ABLATION OF DYSRHYTHMIC FOCUS  01/26/2015   ATRIAL FIBRILLATION ABLATION N/A 07/27/2012   Procedure: ATRIAL FIBRILLATION ABLATION;  Surgeon: Jolly Needle, MD;  Location: [facility] CATH LAB;  Service:  Cardiovascular;  Laterality: N/A;   ATRIAL FIBRILLATION ABLATION N/A 08/25/2018   Procedure: ATRIAL FIBRILLATION ABLATION;  Surgeon: Jolly Needle, MD;  Location: [facility] INVASIVE CV LAB;  Service: Cardiovascular;  Laterality: N/A;   ATRIAL FIBRILLATION ABLATION N/A 10/09/2022   Procedure: ATRIAL FIBRILLATION ABLATION;  Surgeon: Lei Pump, MD;  Location: [facility] INVASIVE CV LAB;  Service: Cardiovascular;  Laterality: N/A;   CARDIAC ELECTROPHYSIOLOGY STUDY AND ABLATION  07-27-2012  DR Nunzio Belch   SUCCESSFUL ABLATION OF A-FIB   CARDIOVERSION  08/06/2012   Procedure: CARDIOVERSION;  Surgeon: Elmyra Haggard, MD;  Location: [facility] ENDOSCOPY;  Service: Cardiovascular;  Laterality: N/A;   CARDIOVERSION N/A 08/25/2014   Procedure: CARDIOVERSION;  Surgeon: Darlis Eisenmenger, MD;  Location: [facility] ENDOSCOPY;  Service: Cardiovascular;  Laterality: N/A;   CARDIOVERSION N/A 11/13/2014   Procedure: CARDIOVERSION;  Surgeon: Jacqueline Matsu, MD;  Location: [facility] ENDOSCOPY;  Service: Cardiovascular;  Laterality: N/A;   CARDIOVERSION N/A 12/28/2014   Procedure: CARDIOVERSION;  Surgeon: Elmyra Haggard, MD;  Location: [facility] ENDOSCOPY;  Service: Cardiovascular;  Laterality: N/A;   CARDIOVERSION N/A 01/30/2017   Procedure: CARDIOVERSION;  Surgeon: Loyde Rule, MD;  Location: [facility] ENDOSCOPY;  Service: Cardiovascular;  Laterality: N/A;   CARDIOVERSION N/A 04/06/2017   Procedure: CARDIOVERSION;  Surgeon: Liza Riggers, MD;  Location: [facility] ENDOSCOPY;  Service: Cardiovascular;  Laterality: N/A;   CARDIOVERSION N/A 05/25/2018   Procedure: CARDIOVERSION;  Surgeon: Maudine Sos, MD;  Location: [facility] ENDOSCOPY;  Service: Cardiovascular;  Laterality: N/A;   CARDIOVERSION N/A 08/09/2018   Procedure: CARDIOVERSION;  Surgeon: Sheryle Donning, MD;  Location: [facility] ENDOSCOPY;  Service: Cardiovascular;  Laterality: N/A;   CARDIOVERSION N/A 07/04/2019   Procedure: CARDIOVERSION;  Surgeon: Liza Riggers, MD;  Location: [facility] ENDOSCOPY;   Service: Cardiovascular;  Laterality: N/A;   CARDIOVERSION N/A 09/23/2019   Procedure: CARDIOVERSION;  Surgeon: Liza Riggers, MD;  Location: [facility] ENDOSCOPY;  Service: Cardiovascular;  Laterality: N/A;   CARDIOVERSION N/A 08/07/2021   Procedure: CARDIOVERSION;  Surgeon: Jacqueline Matsu, MD;  Location: [facility] ENDOSCOPY;  Service: Cardiovascular;  Laterality: N/A;   CARDIOVERSION N/A 03/17/2022   Procedure: CARDIOVERSION;  Surgeon: Jacqueline Matsu, MD;  Location: [facility] ENDOSCOPY;  Service: Cardiovascular;  Laterality: N/A;   CARDIOVERSION N/A 06/26/2022   Procedure: CARDIOVERSION;  Surgeon: Hazle Lites, MD;  Location: [facility] ENDOSCOPY;  Service: Cardiovascular;  Laterality: N/A;   CARDIOVERSION N/A 11/10/2022   Procedure: CARDIOVERSION;  Surgeon: Bridgette Campus, MD;  Location: [facility] INVASIVE CV LAB;  Service: Cardiovascular;  Laterality: N/A;   CARDIOVERSION N/A 03/05/2023   Procedure: CARDIOVERSION;  Surgeon: Antonette Batters, MD;  Location: [facility] ORS;  Service: Cardiovascular;  Laterality: N/A;   CARDIOVERSION N/A 11/17/2023   Procedure: CARDIOVERSION;  Surgeon: Antonette Batters, MD;  Location: [facility] ORS;  Service: Cardiovascular;  Laterality: N/A;   CATARACT EXTRACTION W/ INTRAOCULAR LENS  IMPLANT, BILATERAL     COLONOSCOPY WITH PROPOFOL  N/A 02/13/2015   Procedure: COLONOSCOPY WITH PROPOFOL ;  Surgeon: Garrett Kallman, MD;  Location: [facility] ENDOSCOPY;  Service: Endoscopy;  Laterality: N/A;  CYSTOSCOPY W/ RETROGRADES Bilateral 04/12/2013   Procedure: CYSTOSCOPY WITH RETROGRADE PYELOGRAM;  Surgeon: Osborn Blaze, MD;  Location: [facility];  Service: Urology;  Laterality: Bilateral;   CYSTOSCOPY WITH RETROGRADE PYELOGRAM, URETEROSCOPY AND STENT PLACEMENT  03/06/2021   Procedure: CYSTOSCOPY WITH BILATERAL RETROGRADE PYELOGRAM, LEFT URETEROSCOPY AND STENT PLACEMENT;  Surgeon: Osborn Blaze, MD;  Location: [facility];  Service: Urology;;  1 HR   ELECTROPHYSIOLOGIC STUDY N/A  01/26/2015   Procedure: Atrial Fibrillation Ablation;  Surgeon: Jolly Needle, MD;  Location: [facility] INVASIVE CV LAB;  Service: Cardiovascular;  Laterality: N/A;   HOLMIUM LASER APPLICATION Left 03/06/2021   Procedure: HOLMIUM LASER APPLICATION;  Surgeon: Osborn Blaze, MD;  Location: [facility];  Service: Urology;  Laterality: Left;   JOINT REPLACEMENT     LEFT HEART CATHETERIZATION WITH CORONARY ANGIOGRAM N/A 11/24/2013   Procedure: LEFT HEART CATHETERIZATION WITH CORONARY ANGIOGRAM;  Surgeon: Odie Benne, MD;  Location: [facility] CATH LAB;  Service: Cardiovascular;  Laterality: N/A;   PERCUTANEOUS CORONARY ROTOBLATOR INTERVENTION (PCI-R) N/A 11/28/2013   Procedure: PERCUTANEOUS CORONARY ROTOBLATOR INTERVENTION (PCI-R);  Surgeon: Odie Benne, MD;  Location: [facility] CATH LAB;  Service: Cardiovascular;  Laterality: N/A;   PERCUTANEOUS CORONARY STENT INTERVENTION (PCI-S)  11/24/2013   Procedure: PERCUTANEOUS CORONARY STENT INTERVENTION (PCI-S);  Surgeon: Odie Benne, MD;  Location: [facility] CATH LAB;  Service: Cardiovascular;;   TEE WITHOUT CARDIOVERSION  07/26/2012   Procedure: TRANSESOPHAGEAL ECHOCARDIOGRAM (TEE);  Surgeon: Elmyra Haggard, MD;  Location: [facility] ENDOSCOPY;  Service: Cardiovascular;  Laterality: N/A;   TEE WITHOUT CARDIOVERSION N/A 08/25/2014   Procedure: TRANSESOPHAGEAL ECHOCARDIOGRAM (TEE);  Surgeon: Darlis Eisenmenger, MD;  Location: [facility] ENDOSCOPY;  Service: Cardiovascular;  Laterality: N/A;   TEE WITHOUT CARDIOVERSION  01/26/2015   Procedure: Transesophageal Echocardiogram (Tee);  Surgeon: Lake Pilgrim, MD;  Location: [facility] INVASIVE CV LAB;  Service: Cardiovascular;;   TEE WITHOUT CARDIOVERSION N/A 07/04/2019   Procedure: TRANSESOPHAGEAL ECHOCARDIOGRAM (TEE);  Surgeon: Liza Riggers, MD;  Location: [facility] ENDOSCOPY;  Service: Cardiovascular;  Laterality: N/A;   TONSILLECTOMY  age 56   TOTAL HIP ARTHROPLASTY  07/25/2011   Procedure: TOTAL HIP ARTHROPLASTY  ANTERIOR APPROACH;  Surgeon: Arnie Lao;  Location: [facility] ORS;  Service: Orthopedics;  Laterality: Right;   TOTAL HIP ARTHROPLASTY Left 02/08/2010   TRANSURETHRAL RESECTION OF BLADDER TUMOR WITH GYRUS (TURBT-GYRUS) N/A 04/12/2013   Procedure: TRANSURETHRAL RESECTION OF BLADDER TUMOR WITH GYRUS (TURBT-GYRUS);  Surgeon: Osborn Blaze, MD;  Location: [facility];  Service: Urology;  Laterality: N/A;    Family History  Problem Relation Age of Onset   Heart attack Mother    Heart disease Mother    Heart failure Father    Heart disease Father     Social History   Socioeconomic History   Marital status: Married    Spouse name: Not on file   Number of children: Not on file   Years of education: Not on file   Highest education level: Bachelor's degree (e.g., BA, AB, BS)  Occupational History   Not on file  Tobacco Use   Smoking status: Former    Current packs/day: 0.00    Average packs/day: 2.0 packs/day for 10.0 years (20.0 ttl pk-yrs)    Types: Cigarettes    Start date: 09/21/1971    Quit date: 09/20/1981    Years since quitting: 42.2   Smokeless tobacco: Never   Tobacco comments:    Former smoker 01/07/22  Vaping Use  Vaping status: Never Used  Substance and Sexual Activity   Alcohol use: No    Comment: pt states he has stopped drinking alcohol   Drug use: No   Sexual activity: Not on file  Other Topics Concern   Not on file  Social History Narrative   Not on file   Social Drivers of Health   Financial Resource Strain: Low Risk  (11/19/2023)   Overall Financial Resource Strain (CARDIA)    Difficulty of Paying Living Expenses: Not hard at all  Food Insecurity: No Food Insecurity (11/19/2023)   Hunger Vital Sign    Worried About Running Out of Food in the Last Year: Never true    Ran Out of Food in the Last Year: Never true  Transportation Needs: No Transportation Needs (11/19/2023)   PRAPARE - Administrator, Civil Service (Medical): No     Lack of Transportation (Non-Medical): No  Physical Activity: Sufficiently Active (11/19/2023)   Exercise Vital Sign    Days of Exercise per Week: 3 days    Minutes of Exercise per Session: 50 min  Stress: Stress Concern Present (11/19/2023)   [employer] of Occupational Health - Occupational Stress Questionnaire    Feeling of Stress : To some extent  Social Connections: Socially Isolated (11/19/2023)   Social Connection and Isolation Panel [NHANES]    Frequency of Communication with Friends and Family: Once a week    Frequency of Social Gatherings with Friends and Family: Once a week    Attends Religious Services: Never    Database administrator or Organizations: No    Attends Engineer, structural: Not on file    Marital Status: Married  Catering manager Violence: Not on file    ROS     Objective   BP 137/83   Pulse 60   Ht 5\' 9"  (1.753 m)   Wt 198 lb 1.9 oz (89.9 kg)   SpO2 94%   BMI 29.26 kg/m   Physical Exam General: Alert, oriented HEENT: PERRLA, EOMI, moist mucosa CV: Regular PULMONARY: LUNGS CLEAR BILATERALLY MSK: Strength equal bilaterally, normal gait Extremities: No pedal edema Psych: Pleasant affect      Assessment & Plan:   Encounter to establish care  Persistent atrial fibrillation (HCC) Assessment & Plan: Continue Ziac , diltiazem , Xarelto .   Hyperlipidemia, mixed Assessment & Plan: Continue Zetia  and Crestor .   Hypercoagulable state due to persistent atrial fibrillation [facility]) Assessment & Plan: Continue Xarelto .     Return in about 3 months (around 02/23/2024) for physical.   Laneta Pintos, MD

## 2023-11-23 NOTE — Assessment & Plan Note (Signed)
Continue Zetia and Crestor

## 2023-11-23 NOTE — Patient Instructions (Addendum)
 It was nice to see you today,  We addressed the following topics today: -No changes to your medications or new labs today.  Prior to your next visit we can get additional labs and discussed the results - Please make sure you schedule that follow-up with the pulmonologist on any imaging that is recommended.  Have a great day,  Etha Henle, MD

## 2023-11-24 NOTE — Telephone Encounter (Signed)
 Prescription refill request for Xarelto  received.  Indication:afib Last office visit:12/24 Weight:89.9  kg Age:78 Scr:0.81 1/25 CrCl:95.57  ml/min  Prescription refilled

## 2023-12-01 ENCOUNTER — Ambulatory Visit (INDEPENDENT_AMBULATORY_CARE_PROVIDER_SITE_OTHER): Admitting: Acute Care

## 2023-12-01 ENCOUNTER — Encounter: Payer: Self-pay | Admitting: Acute Care

## 2023-12-01 VITALS — BP 123/69 | HR 55 | Ht 64.0 in | Wt 199.4 lb

## 2023-12-01 DIAGNOSIS — J479 Bronchiectasis, uncomplicated: Secondary | ICD-10-CM

## 2023-12-01 DIAGNOSIS — I728 Aneurysm of other specified arteries: Secondary | ICD-10-CM | POA: Diagnosis not present

## 2023-12-01 DIAGNOSIS — R911 Solitary pulmonary nodule: Secondary | ICD-10-CM

## 2023-12-01 DIAGNOSIS — Z87891 Personal history of nicotine dependence: Secondary | ICD-10-CM

## 2023-12-01 NOTE — Patient Instructions (Addendum)
 It is good to see you today. I have ordered a CT scan . You will get a call to get this scheduled.  You will follow up with me 1-2 weeks after the scan has been done to review results, If you cough up sputum, we will give you collection cups so we can send specimens for culture.  We will give you instructions on how to collect them.  Please consider a non sedating antihistamine for your post nasal drip. Just make sure there is no decongestant as a part of it . ( Zyrtec, Claritin and Allegra are good to use, not the Zyrtec D, Allegra D or Claritin D. ) Call if you need us  sooner.  Please contact office for sooner follow up if symptoms do not improve or worsen or seek emergency care

## 2023-12-01 NOTE — Progress Notes (Signed)
 History of Present Illness Damon [name] is a 78 y.o. male former smoker quit 1983. Past medical history significant for hypertension, paroxysmal A-fib, coronary artery disease, OSA on CPAP, GERD, bladder cancer, vitamin D  deficiency, insomnia. Dr. Thelda Finney.    12/01/2023 Pt. Presents for follow up. Pt. Has been followed by Dr. 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 He states he has been doing well. No respiratory issues , no night sweats. He did not have the CT Chest follow up that was discussed in his 06/07/2024 appointment . Last scan was 04/2024, so I have ordered the repeat imaging.This will be to follow his pulmonary nodule, as well as his suspected MAI  and bronchiectasis. We will also send him home with sputum cups for cultures in the event he does produce some sputum. He is in agreement with this plan. 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 If it has increased in size we will refer to vascular surgery.  Test Results: HRCT ordered     Latest Ref Rng & Units 07/29/2023    3:02 PM 04/16/2023   10:40 AM 01/13/2023    3:41 PM  CBC  WBC 3.8 - 10.8 Thousand/uL 7.0  5.9  6.8   Hemoglobin 13.2 - 17.1 g/dL 29.5  28.4  13.2   Hematocrit 38.5 - 50.0 % 45.0  44.6  45.2   Platelets 140 - 400 Thousand/uL 201  195  202        Latest Ref Rng & Units 07/29/2023    3:02 PM 04/16/2023   10:40 AM 01/13/2023    3:41 PM  BMP  Glucose 65 - 99 mg/dL 85  90  90   BUN 7 - 25 mg/dL 11  14  15    Creatinine 0.70 - 1.28 mg/dL 4.40  1.02  7.25   BUN/Creat Ratio 6 - 22 (calc) SEE NOTE:  SEE NOTE:  SEE NOTE:   Sodium 135 - 146 mmol/L 141  142  142   Potassium 3.5 - 5.3 mmol/L 4.1  4.1  4.0   Chloride 98 - 110 mmol/L 104  109  107   CO2 20 - 32 mmol/L 28  26  26    Calcium  8.6 - 10.3 mg/dL 9.4  9.2  9.4     BNP No results found for: "BNP"  ProBNP No results found for: "PROBNP"  PFT No results found for: "FEV1PRE", "FEV1POST", "FVCPRE",  "FVCPOST", "TLC", "DLCOUNC", "PREFEV1FVCRT", "PSTFEV1FVCRT"  No results found.   Past medical hx Past Medical History:  Diagnosis Date   Bladder cancer (HCC)    Hearing loss of both ears    History of colon polyps    BENIGN   Hyperlipidemia    Hypertension    Mitral regurgitation    OSA on CPAP    PAD (peripheral artery disease) (HCC)    ABI--  RIGHT SIDE NORMAL LEFT SIDE MODERATELY REDUCED W/ DISTAL LEFT SFA STENOSIS//  MILD CALDICATION   PAF (paroxysmal atrial fibrillation) (HCC)     a. s/p PVI 2014; b. s/p redo PVI and CTI 01/2015   Vitamin D  deficiency      Social History   Tobacco Use   Smoking status: Former    Current packs/day: 0.00    Average packs/day: 2.0 packs/day for 10.0 years (20.0 ttl pk-yrs)    Types: Cigarettes    Start date: 09/21/1971    Quit date: 09/20/1981    Years since  quitting: 42.2   Smokeless tobacco: Never   Tobacco comments:    Former smoker 01/07/22  Vaping Use   Vaping status: Never Used  Substance Use Topics   Alcohol use: No    Comment: pt states he has stopped drinking alcohol   Drug use: No    Mr.Gorey reports that he quit smoking about 42 years ago. His smoking use included cigarettes. He started smoking about 52 years ago. He has a 20 pack-year smoking history. He has never used smokeless tobacco. He reports that he does not drink alcohol and does not use drugs.  Tobacco Cessation: Counseling given: Not Answered Tobacco comments: Former smoker 01/07/22 Former smoker with a 20 pack year smoking history, Quit 1983  Past surgical hx, Family hx, Social hx all reviewed.  Current Outpatient Medications on File Prior to Visit  Medication Sig   bisoprolol -hydrochlorothiazide  (ZIAC ) 10-6.25 MG tablet Take 0.5 tablets by mouth daily.   Cholecalciferol (VITAMIN D3) 125 MCG (5000 UT) CAPS Take 10,000 units Daily (Patient taking differently: 5,000 Units. Take 10,000 units Daily)   diltiazem  (CARDIZEM  CD) 180 MG 24 hr capsule Take 1 capsule  (180 mg total) by mouth daily.   diltiazem  (CARDIZEM ) 30 MG tablet Take 1 tablet every 4 hours AS NEEDED for AFIB heart rate >100 as long as top BP >100.   ezetimibe  (ZETIA ) 10 MG tablet TAKE 1 TABLET BY MOUTH DAILY FOR CHOLESTEROL   Multiple Vitamin (MULTIVITAMIN WITH MINERALS) TABS tablet Take 1 tablet Daily   nitroGLYCERIN  (NITROSTAT ) 0.4 MG SL tablet DISSOLVE ONE TABLET UNDER THE TONGUE EVERY 5 MINUTES AS NEEDED FOR CHEST PAIN.DO NOT EXCEED A TOTAL OF 3 DOSES IN 15 MINUTES   NON FORMULARY Pt uses a c-pap nightly   Polyvinyl Alcohol-Povidone (REFRESH OP) Place 1 drop into both eyes 3 (three) times daily as needed (dry/irritated eyes.).   rosuvastatin  (CRESTOR ) 40 MG tablet TAKE ONE-HALF TABLET BY MOUTH  DAILY FOR CHOLESTEROL   XARELTO  20 MG TABS tablet TAKE 1 TABLET BY MOUTH DAILY  WITH SUPPER   No current facility-administered medications on file prior to visit.     No Known Allergies  Review Of Systems:  Constitutional:   No  weight loss, night sweats,  Fevers, chills, fatigue, or  lassitude.  HEENT:   No headaches,  Difficulty swallowing,  Tooth/dental problems, or  Sore throat,                No sneezing, itching, ear ache, nasal congestion, + post nasal drip,   CV:  No chest pain,  Orthopnea, PND, swelling in lower extremities, anasarca, dizziness, palpitations, syncope.   GI  No heartburn, indigestion, abdominal pain, nausea, vomiting, diarrhea, change in bowel habits, loss of appetite, bloody stools.   Resp: No shortness of breath with exertion or at rest.  No excess mucus, no productive cough,  No non-productive cough,  No coughing up of blood.  No change in color of mucus.  No wheezing.  No chest wall deformity  Skin: no rash or lesions.  GU: no dysuria, change in color of urine, no urgency or frequency.  No flank pain, no hematuria   MS:  No joint pain or swelling.  No decreased range of motion.  No back pain.  Psych:  No change in mood or affect. No depression or  anxiety.  No memory loss.   Vital Signs BP 123/69 (BP Location: Right Arm, Patient Position: Sitting, Cuff Size: Large)   Pulse (!) 55   Ht  5\' 4"  (1.626 m)   Wt 199 lb 6.4 oz (90.4 kg)   SpO2 94%   BMI 34.23 kg/m    Physical Exam:  General- No distress,  A&Ox3, pleasant ENT: No sinus tenderness, TM clear, pale nasal mucosa, no oral exudate,no post nasal drip, no LAN Cardiac: S1, S2, regular rate and rhythm, no murmur Chest: No wheeze/ rales/ dullness; no accessory muscle use, no nasal flaring, no sternal retractions, slightly diminished per bases Abd.: Soft Non-tender, ND, BS +, Body mass index is 34.23 kg/m.  Ext: No clubbing cyanosis, edema, no obvious deformities Neuro:  normal strength, MAE x 4, A&O x 3 Skin: No rashes, warm and dry, no obvious skin lesions  Psych: normal mood and behavior   Assessment/Plan Lung Nodule Bronchiectasis with possible MAI Asymptomatic Plan Needs Follow up HRCT.>> Ordered scheduled for 12/04/2023 Follow up after scheduled for 12/23/2023 Sputum cups set home with patient for possible cultures   Splenic Aneurysm Plan If increased in size on new imaging will need vascular surgery consult     I spent 25 minutes dedicated to the care of this patient on the date of this encounter to include pre-visit review of records, face-to-face time with the patient discussing conditions above, post visit ordering of testing, clinical documentation with the electronic health record, making appropriate referrals as documented, and communicating necessary information to the patient's healthcare team.        Raejean Bullock, NP 12/01/2023  11:29 AM

## 2023-12-04 ENCOUNTER — Ambulatory Visit
Admission: RE | Admit: 2023-12-04 | Discharge: 2023-12-04 | Disposition: A | Discharge status: Home / Self Care | Source: Ambulatory Visit | Attending: Acute Care | Admitting: Acute Care

## 2023-12-04 DIAGNOSIS — J479 Bronchiectasis, uncomplicated: Secondary | ICD-10-CM

## 2023-12-04 DIAGNOSIS — R911 Solitary pulmonary nodule: Secondary | ICD-10-CM

## 2023-12-08 ENCOUNTER — Other Ambulatory Visit: Payer: Self-pay | Admitting: Family

## 2023-12-08 DIAGNOSIS — I2510 Atherosclerotic heart disease of native coronary artery without angina pectoris: Secondary | ICD-10-CM

## 2023-12-08 DIAGNOSIS — I10 Essential (primary) hypertension: Secondary | ICD-10-CM

## 2023-12-15 ENCOUNTER — Telehealth: Payer: Self-pay | Admitting: Family Medicine

## 2023-12-15 DIAGNOSIS — I10 Essential (primary) hypertension: Secondary | ICD-10-CM

## 2023-12-15 DIAGNOSIS — E782 Mixed hyperlipidemia: Secondary | ICD-10-CM

## 2023-12-15 DIAGNOSIS — I2510 Atherosclerotic heart disease of native coronary artery without angina pectoris: Secondary | ICD-10-CM

## 2023-12-15 NOTE — Telephone Encounter (Signed)
 Copied from CRM [id number]. Topic: Clinical - Medication Refill >> Dec 15, 2023  1:19 PM [place] B wrote: Medication: ezetimibe  (ZETIA ) 10 MG tablet/ bisoprolol -hydrochlorothiazide  (ZIAC ) 10-6.25 MG tablet  Has the patient contacted their pharmacy? yes (Agent: If yes, when and what did the pharmacy advise?)contact pcp  This is the patient's preferred pharmacy:   [facility] [place] [place] - [address] [place] [address] Phone: [phone] Fax: [phone]  Is this the correct pharmacy for this prescription? yes    Has the prescription been filled recently? no  Is the patient out of the medication? no  Has the patient been seen for an appointment in the last year OR does the patient have an upcoming appointment? yes  Can we respond through MyChart? yes  Agent: Please be advised that Rx refills may take up to 3 business days. We ask that you follow-up with your pharmacy.

## 2023-12-16 MED ORDER — BISOPROLOL-HYDROCHLOROTHIAZIDE 10-6.25 MG PO TABS: 0.5000 | ORAL_TABLET | Freq: Every day | ORAL | 0 refills | Status: DC

## 2023-12-16 MED ORDER — EZETIMIBE 10 MG PO TABS
ORAL_TABLET | ORAL | 3 refills | Status: AC
Start: 2023-12-16 — End: ?

## 2023-12-23 ENCOUNTER — Ambulatory Visit (INDEPENDENT_AMBULATORY_CARE_PROVIDER_SITE_OTHER): Admitting: Acute Care

## 2023-12-23 ENCOUNTER — Encounter: Payer: Self-pay | Admitting: Acute Care

## 2023-12-23 VITALS — BP 137/76 | HR 54 | Ht 69.0 in | Wt 194.4 lb

## 2023-12-23 DIAGNOSIS — R911 Solitary pulmonary nodule: Secondary | ICD-10-CM | POA: Diagnosis not present

## 2023-12-23 DIAGNOSIS — J849 Interstitial pulmonary disease, unspecified: Secondary | ICD-10-CM | POA: Diagnosis not present

## 2023-12-23 DIAGNOSIS — I728 Aneurysm of other specified arteries: Secondary | ICD-10-CM

## 2023-12-23 DIAGNOSIS — J479 Bronchiectasis, uncomplicated: Secondary | ICD-10-CM | POA: Diagnosis not present

## 2023-12-23 DIAGNOSIS — Z87891 Personal history of nicotine dependence: Secondary | ICD-10-CM

## 2023-12-23 DIAGNOSIS — I719 Aortic aneurysm of unspecified site, without rupture: Secondary | ICD-10-CM

## 2023-12-23 NOTE — Progress Notes (Signed)
 History of Present Illness Damon [name] is a 78 y.o. male  former smoker quit 1983. Past medical history significant for hypertension, paroxysmal A-fib, coronary artery disease, OSA on CPAP, GERD, bladder cancer, vitamin D  deficiency, insomnia. Previously followed by  Dr. Thelda Finney. He will now be followed by Dr. Bertrum Brodie.   12/23/2023 Pt. Presents for follow up after CT Chest. Pt. Has been followed by Dr. 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 He states he has been doing well. No respiratory issues , no night sweats. He did not have the CT Chest follow up that was discussed in his 06/07/2024 appointment with Dr. Baldwin Levee. Last scan was 04/2024, so we  ordered the repeat  HRCT for  May 2025 . He is here to review the results of the scan.    Scan again shows  mild bronchiectasis findings which are concerning for MAC. Pt.is totally asymptomatic. We have discussed starting Flutter valve and Mucinex for mucus clearance. He is in agreement with starting this. We have tried in the past to get sputum for culture. He has been unable to produce any samples for evaluation.   This years can also showed scattered basilar subpleural reticular densities. Per radiology, it is difficult to exclude superimposed ILD such as UIP. I do hear some fine crackles in the bases of his lungs. I will have him see Dr. Bertrum Brodie. We will send him home today with the ILD Questionnaire.   There is a new LLL 4 mm lung nodule. We will do a 12 month follow up Ct Chest to follow for stability. He denies any hemoptysis or unexplained weight loss.   He has a 4.0 cm ascending aortic aneurysm. We will continue to follow this on his annual scans. He is also followed by cardiology for this finding. 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   He has aortic atherosclerosis and coronary artery calcifications. He is followed by cardiology and  is on statin therapy. There was also notation of  Enlarged right and left pulmonary arteries, indicative of pulmonary arterial hypertension.Pt. had a recent echo per his cardiologist.He has an appointment today. I have sent him with a copy of the CT report for his cardiologist to review. He does not endorse any shortness of breath.   Test Results: HRCT Chest 12/04/2023 Cardiovascular: Atherosclerotic calcification of the aorta and coronary arteries. Ascending aorta measures 4.0 cm (coronal image 102), stable. Enlarged right and left pulmonary arteries. Heart is at the upper limits of normal in size to mildly enlarged. No pericardial effusion.   Mediastinum/Nodes: No pathologically enlarged mediastinal or axillary lymph nodes. Hilar regions are difficult to definitively evaluate without IV contrast. Esophagus is grossly unremarkable.   Lungs/Pleura: Mild basilar predominant subpleural reticulation and ground-glass, similar to 04/23/2023. Peripheral peribronchovascular nodularity in the mid and lower lung zones, also seen on prior. Mild cylindrical bronchiectasis. Questionable trace bronchiolectasis. No honeycombing. New 4 mm lateral left lower lobe nodule (4/107). Additional left lower lobe nodules measure up to 6 mm (4/119), unchanged. No pleural fluid. Airway is unremarkable. No air trapping.   Upper Abdomen: Tiny gallstones. 10 mm peripherally calcified splenic artery aneurysm. Visualized portions of the liver, gallbladder, adrenal glands, kidneys, spleen, pancreas, stomach and bowel are otherwise grossly unremarkable. No upper abdominal adenopathy.   Musculoskeletal: Degenerative changes in the spine.   IMPRESSION: 1. 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 2. Scattered basilar subpleural reticular densities. Difficult to exclude superimposed interstitial lung disease such as  usual interstitial pneumonitis. Findings are indeterminate for UIP per consensus guidelines:  3. New 4 mm left lower lobe nodule. Recommend follow-up CT chest without contrast in 1 year. 4. 4.0 cm ascending aortic aneurysm, stable. Recommend annual imaging followup by CTA or MRA.  5. Cholelithiasis. 6. 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 7. Aortic atherosclerosis (ICD10-I70.0). Coronary artery calcification. 8. Enlarged right and left pulmonary arteries, indicative of pulmonary arterial hypertension.     Latest Ref Rng & Units 07/29/2023    3:02 PM 04/16/2023   10:40 AM 01/13/2023    3:41 PM  CBC  WBC 3.8 - 10.8 Thousand/uL 7.0  5.9  6.8   Hemoglobin 13.2 - 17.1 g/dL 16.1  09.6  04.5   Hematocrit 38.5 - 50.0 % 45.0  44.6  45.2   Platelets 140 - 400 Thousand/uL 201  195  202        Latest Ref Rng & Units 07/29/2023    3:02 PM 04/16/2023   10:40 AM 01/13/2023    3:41 PM  BMP  Glucose 65 - 99 mg/dL 85  90  90   BUN 7 - 25 mg/dL 11  14  15    Creatinine 0.70 - 1.28 mg/dL 4.09  8.11  9.14   BUN/Creat Ratio 6 - 22 (calc) SEE NOTE:  SEE NOTE:  SEE NOTE:   Sodium 135 - 146 mmol/L 141  142  142   Potassium 3.5 - 5.3 mmol/L 4.1  4.1  4.0   Chloride 98 - 110 mmol/L 104  109  107   CO2 20 - 32 mmol/L 28  26  26    Calcium  8.6 - 10.3 mg/dL 9.4  9.2  9.4     BNP No results found for: "BNP"  ProBNP No results found for: "PROBNP"  PFT No results found for: "FEV1PRE", "FEV1POST", "FVCPRE", "FVCPOST", "TLC", "DLCOUNC", "PREFEV1FVCRT", "PSTFEV1FVCRT"  CT CHEST HIGH RESOLUTION Result Date: 12/04/2023 CLINICAL DATA:  Lung nodule, bronchiectasis. EXAM: CT CHEST WITHOUT CONTRAST TECHNIQUE: Multidetector CT imaging of the chest was performed following the standard protocol without intravenous contrast. High resolution imaging of the lungs, as well as inspiratory and expiratory imaging, was performed. RADIATION DOSE REDUCTION: This exam was performed according to the departmental dose-optimization  program which includes automated exposure control, adjustment of the mA and/or kV according to patient size and/or use of iterative reconstruction technique. COMPARISON:  04/23/2023. FINDINGS: Cardiovascular: Atherosclerotic calcification of the aorta and coronary arteries. Ascending aorta measures 4.0 cm (coronal image 102), stable. Enlarged right and left pulmonary arteries. Heart is at the upper limits of normal in size to mildly enlarged. No pericardial effusion. Mediastinum/Nodes: No pathologically enlarged mediastinal or axillary lymph nodes. Hilar regions are difficult to definitively evaluate without IV contrast. Esophagus is grossly unremarkable. Lungs/Pleura: Mild basilar predominant subpleural reticulation and ground-glass, similar to 04/23/2023. Peripheral peribronchovascular nodularity in the mid and lower lung zones, also seen on prior. Mild cylindrical bronchiectasis. Questionable trace bronchiolectasis. No honeycombing. New 4 mm lateral left lower lobe nodule (4/107). Additional left lower lobe nodules measure up to 6 mm (4/119), unchanged. No pleural fluid. Airway is unremarkable. No air trapping. Upper Abdomen: Tiny gallstones. 10 mm peripherally calcified splenic artery aneurysm. Visualized portions of the liver, gallbladder, adrenal glands, kidneys, spleen, pancreas, stomach and bowel are otherwise grossly unremarkable. No upper abdominal adenopathy. Musculoskeletal: Degenerative changes in the spine.  IMPRESSION: 1. Mild cylindrical bronchiectasis with peribronchovascular nodularity, findings which may be due to chronic mycobacterium avium complex. 2. Scattered basilar subpleural reticular densities. Difficult to exclude superimposed interstitial lung disease such as usual interstitial pneumonitis. Findings are indeterminate for UIP per consensus guidelines: Diagnosis of Idiopathic Pulmonary Fibrosis: An Official ATS/ERS/JRS/ALAT Clinical Practice Guideline. Am Annie Barton Crit Care Med Vol 198, Iss  5, [phone], Mar 21 2017. 3. New 4 mm left lower lobe nodule. Recommend follow-up CT chest without contrast in 1 year. 4. 4.0 cm ascending aortic aneurysm, stable. Recommend annual imaging followup by CTA or MRA. This recommendation follows 2010 ACCF/AHA/AATS/ACR/ASA/SCA/SCAI/SIR/STS/SVM Guidelines for the Diagnosis and Management of Patients with Thoracic Aortic Disease. Circulation. 2010; 121: V409-W119. Aortic aneurysm NOS (ICD10-I71.9). 5. Cholelithiasis. 6. 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 7. Aortic atherosclerosis (ICD10-I70.0). Coronary artery calcification. 8. Enlarged right and left pulmonary arteries, indicative of pulmonary arterial hypertension. Electronically Signed   By: Shearon Denis M.D.   On: 12/04/2023 14:38     Past medical hx Past Medical History:  Diagnosis Date   Bladder cancer (HCC)    Hearing loss of both ears    History of colon polyps    BENIGN   Hyperlipidemia    Hypertension    Mitral regurgitation    OSA on CPAP    PAD (peripheral artery disease) (HCC)    ABI--  RIGHT SIDE NORMAL LEFT SIDE MODERATELY REDUCED W/ DISTAL LEFT SFA STENOSIS//  MILD CALDICATION   PAF (paroxysmal atrial fibrillation) (HCC)     a. s/p PVI 2014; b. s/p redo PVI and CTI 01/2015   Vitamin D  deficiency      Social History   Tobacco Use   Smoking status: Former    Current packs/day: 0.00    Average packs/day: 2.0 packs/day for 10.0 years (20.0 ttl pk-yrs)    Types: Cigarettes    Start date: 09/21/1971    Quit date: 09/20/1981    Years since quitting: 42.2   Smokeless tobacco: Never   Tobacco comments:    Former smoker 01/07/22  Vaping Use   Vaping status: Never Used  Substance Use Topics   Alcohol use: No    Comment: pt states he has stopped drinking alcohol   Drug use: No    Mr.Pagett reports that he quit smoking about 42 years ago. His smoking use included cigarettes. He started smoking about 52 years ago. He has a 20 pack-year smoking history. He has never used smokeless  tobacco. He reports that he does not drink alcohol and does not use drugs.  Tobacco Cessation: Counseling given: Not Answered Tobacco comments: Former smoker 01/07/22 Former smoker   Past surgical hx, Family hx, Social hx all reviewed.  Current Outpatient Medications on File Prior to Visit  Medication Sig   bisoprolol -hydrochlorothiazide  (ZIAC ) 10-6.25 MG tablet Take 0.5 tablets by mouth daily.   Cholecalciferol (VITAMIN D3) 125 MCG (5000 UT) CAPS Take 10,000 units Daily (Patient taking differently: 5,000 Units. Take 10,000 units Daily)   diltiazem  (CARDIZEM  CD) 180 MG 24 hr capsule Take 1 capsule (180 mg total) by mouth daily.   diltiazem  (CARDIZEM ) 30 MG tablet Take 1 tablet every 4 hours AS NEEDED for AFIB heart rate >100 as long as top BP >100.   ezetimibe  (ZETIA ) 10 MG tablet TAKE 1 TABLET BY MOUTH DAILY FOR CHOLESTEROL   Multiple Vitamin (MULTIVITAMIN WITH MINERALS) TABS tablet Take 1 tablet Daily   nitroGLYCERIN  (NITROSTAT ) 0.4 MG SL tablet DISSOLVE ONE TABLET UNDER THE TONGUE EVERY 5  MINUTES AS NEEDED FOR CHEST PAIN.DO NOT EXCEED A TOTAL OF 3 DOSES IN 15 MINUTES   NON FORMULARY Pt uses a c-pap nightly   Polyvinyl Alcohol-Povidone (REFRESH OP) Place 1 drop into both eyes 3 (three) times daily as needed (dry/irritated eyes.).   rosuvastatin  (CRESTOR ) 40 MG tablet TAKE ONE-HALF TABLET BY MOUTH  DAILY FOR CHOLESTEROL   XARELTO  20 MG TABS tablet TAKE 1 TABLET BY MOUTH DAILY  WITH SUPPER   No current facility-administered medications on file prior to visit.     No Known Allergies  Review Of Systems:  Constitutional:   No  weight loss, night sweats,  Fevers, chills, fatigue, or  lassitude.  HEENT:   No headaches,  Difficulty swallowing,  Tooth/dental problems, or  Sore throat,                No sneezing, itching, ear ache, nasal congestion, post nasal drip, + HOH  CV:  No chest pain,  Orthopnea, PND, swelling in lower extremities, anasarca, dizziness, palpitations, syncope.   GI   No heartburn, indigestion, abdominal pain, nausea, vomiting, diarrhea, change in bowel habits, loss of appetite, bloody stools.   Resp: No shortness of breath with exertion or at rest.  No excess mucus, no productive cough,  No non-productive cough,  No coughing up of blood.  No change in color of mucus.  No wheezing.  No chest wall deformity  Skin: no rash or lesions.  GU: no dysuria, change in color of urine, no urgency or frequency.  No flank pain, no hematuria   MS:  No joint pain or swelling.  No decreased range of motion.  No back pain.  Psych:  No change in mood or affect. No depression or anxiety.  No memory loss.   Vital Signs BP 137/76 (BP Location: Left Arm, Patient Position: Sitting, Cuff Size: Large)   Pulse (!) 54   Ht 5\' 9"  (1.753 m)   Wt 194 lb 6.4 oz (88.2 kg)   SpO2 93%   BMI 28.71 kg/m    Physical Exam:  General- No distress,  A&Ox3, pleasant , HOH ENT: No sinus tenderness, TM clear, pale nasal mucosa, no oral exudate,no post nasal drip, no LAN Cardiac: S1, S2, regular rate and rhythm, no murmur Chest: No wheeze/ rales/ dullness; no accessory muscle use, no nasal flaring, no sternal retractions, few crackles per  bases Abd.: Soft Non-tender, ND, BS +, Body mass index is 28.71 kg/m.  Ext: No clubbing cyanosis, edema, no obvious deformities Neuro:  normal strength, MAE x 4, A&O x 3, appropriate Skin: No rashes, warm and dry, no obvious skin  lesions  Psych: normal mood and behavior   Assessment/Plan  Pulmonary Nodule, 4 mm and new Former smoker Plan CT Chest in 12 months to ensure stability   Mild bronchiectasis Mild bronchiectasis with airway enlargement and mucus accumulation, currently asymptomatic. Asymptomatic for MAI infection. - Order flutter valve for airway clearance. - Start Mucinex 1200 mg daily with a full glass of water to thin secretions. - Collect sputum specimens if able.  Pulmonary fibrosis/ ILD Pulmonary fibrosis with reticular  densities on CT scan, suggesting possible interstitial lung disease. Currently asymptomatic. - Refer to Dr. Bertrum Brodie for evaluation of interstitial lung disease. - Complete ILD questionnaire - Order one-year follow-up CT scan to monitor pulmonary nodule.  Enlarged pulmonary artery Enlarged pulmonary artery on CT scan, asymptomatic. Echocardiogram results to be reviewed with cardiologist. - Provide copy of CT scan to cardiologist for review.  Aortic  Aneurysm 4.0 cm/ splenic artery Aneurysm stable at 10 mm - Follow on annual imaging - Aortic Aneurysm followed by cardiology - Refer to thoracic surgery / vascular surgery with any progression  I spent 45 minutes dedicated to the care of this patient on the date of this encounter to include pre-visit review of records, face-to-face time with the patient discussing conditions above, post visit ordering of testing, clinical documentation with the electronic health record, making appropriate referrals as documented, and communicating necessary information to the patient's healthcare team.    Raejean Bullock, NP 12/23/2023  10:17 AM

## 2023-12-23 NOTE — Patient Instructions (Addendum)
 It is good to see you today. We will order a flutter valve through a DME, as we are out of them. Use 2-3 times a day, do 6-8 blows each time.  Start Mucinex 1200 mg daily with a full glass of water. This should help thin secretions and help you mobilize them Collect sputum specimens if you are able.  Bring to office within 4 hours, ok to put in fridge as long as you get them here in that 4 hours. I will refer you to Dr. Bertrum Brodie to be seen for the finding of ILD on your scan. You will get a call to schedule an appointment with him.  You will need a 1 year follow up CT scan to monitor the 4 mm pulmonary nodule.  You will get a call to schedule this closer to the time I will give you a copy of the CT Chest to review with your cardiologist.  There was notation of enlarged right and left pulmonary arteries, indicative of pulmonary arterial hypertension. I would like for him to evaluate this from your recent echo.  Bring sputum cultures in if you are able to produce any. Follow up with Dr. Bertrum Brodie at his first availability, and in 12 months with me for the lung nodule.  Please contact office for sooner follow up if symptoms do not improve or worsen or seek emergency care

## 2023-12-25 ENCOUNTER — Other Ambulatory Visit: Payer: Self-pay

## 2023-12-25 DIAGNOSIS — J479 Bronchiectasis, uncomplicated: Secondary | ICD-10-CM

## 2023-12-26 LAB — RESPIRATORY CULTURE OR RESPIRATORY AND SPUTUM CULTURE: MICRO NUMBER:: 16549024

## 2023-12-30 ENCOUNTER — Ambulatory Visit: Payer: Self-pay | Admitting: Family Medicine

## 2023-12-30 ENCOUNTER — Ambulatory Visit: Payer: Self-pay | Admitting: Primary Care

## 2023-12-30 NOTE — Progress Notes (Signed)
 Sputum culture was contaminated, would need to repeat if able

## 2023-12-30 NOTE — Telephone Encounter (Signed)
 Spoke with patient regarding prior message . Advised patient that his sputum culture was contaminated, would need to repeat if able . Patient stated he has received a flutter valve in the mail and is unsure on how to work it. Advised patient he can bring it into the office and we can help him with it. Patient stated if he is able to cough something up he will bring it into the office . Patient's voice was understanding.Nothing else further needed .

## 2023-12-30 NOTE — Telephone Encounter (Signed)
 This RN spoke with patient and patient states he is having trouble getting another sputum sample. Patient states if he is able to get another sample, he will bring it in.   Copied from CRM [id number]. Topic: General - Other >> Dec 30, 2023  2:12 PM [place] S wrote: Reason for CRM: patient is calling back, he states he doesn't have anything left to use for the repeat test

## 2024-01-01 NOTE — Telephone Encounter (Signed)
 Damon [name] called me back. I informed Damon [name] of Damon [name]'s note and Damon [name] verbalized understanding. I informed Damon [name] that if he felt like he was able to complete the Sputum, I would place new cups up front. Damon [name] stated he could try and complete the test. I told Damon [name] that this would be in the front office ready for pick up on Monday. NFN

## 2024-01-12 ENCOUNTER — Ambulatory Visit (HOSPITAL_COMMUNITY)
Admission: RE | Admit: 2024-01-12 | Discharge: 2024-01-12 | Disposition: A | Discharge status: Home / Self Care | Payer: Medicare Other | Source: Ambulatory Visit | Attending: Physician Assistant | Admitting: Physician Assistant

## 2024-01-12 ENCOUNTER — Encounter (HOSPITAL_COMMUNITY): Payer: Self-pay | Admitting: Physician Assistant

## 2024-01-12 VITALS — BP 126/74 | HR 59 | Ht 69.0 in | Wt 196.6 lb

## 2024-01-12 DIAGNOSIS — I4819 Other persistent atrial fibrillation: Secondary | ICD-10-CM | POA: Diagnosis not present

## 2024-01-12 DIAGNOSIS — D6869 Other thrombophilia: Secondary | ICD-10-CM | POA: Diagnosis not present
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
## 2024-01-14 ENCOUNTER — Ambulatory Visit: Payer: Medicare Other | Admitting: Nurse Practitioner

## 2024-01-21 ENCOUNTER — Ambulatory Visit: Payer: Medicare Other | Admitting: Nurse Practitioner

## 2024-02-11 ENCOUNTER — Other Ambulatory Visit

## 2024-02-11 DIAGNOSIS — J479 Bronchiectasis, uncomplicated: Secondary | ICD-10-CM

## 2024-02-15 LAB — AFB CULTURE WITH SMEAR (NOT AT ARMC)

## 2024-02-24 ENCOUNTER — Other Ambulatory Visit: Payer: Self-pay | Admitting: *Deleted

## 2024-02-24 DIAGNOSIS — E782 Mixed hyperlipidemia: Secondary | ICD-10-CM

## 2024-02-24 DIAGNOSIS — Z125 Encounter for screening for malignant neoplasm of prostate: Secondary | ICD-10-CM

## 2024-02-24 DIAGNOSIS — R7309 Other abnormal glucose: Secondary | ICD-10-CM

## 2024-02-24 DIAGNOSIS — I10 Essential (primary) hypertension: Secondary | ICD-10-CM

## 2024-02-25 ENCOUNTER — Other Ambulatory Visit

## 2024-02-25 DIAGNOSIS — E782 Mixed hyperlipidemia: Secondary | ICD-10-CM

## 2024-02-25 DIAGNOSIS — Z125 Encounter for screening for malignant neoplasm of prostate: Secondary | ICD-10-CM

## 2024-02-25 DIAGNOSIS — I10 Essential (primary) hypertension: Secondary | ICD-10-CM

## 2024-02-25 DIAGNOSIS — R7309 Other abnormal glucose: Secondary | ICD-10-CM

## 2024-02-26 ENCOUNTER — Ambulatory Visit: Payer: Self-pay | Admitting: Family Medicine

## 2024-02-26 LAB — CBC WITH DIFFERENTIAL/PLATELET
Basophils Absolute: 0 x10E3/uL (ref 0.0–0.2)
Basos: 1 %
EOS (ABSOLUTE): 0.3 x10E3/uL (ref 0.0–0.4)
Eos: 4 %
Hematocrit: 46.3 % (ref 37.5–51.0)
Hemoglobin: 15.3 g/dL (ref 13.0–17.7)
Immature Grans (Abs): 0 x10E3/uL (ref 0.0–0.1)
Immature Granulocytes: 0 %
Lymphocytes Absolute: 1.5 x10E3/uL (ref 0.7–3.1)
Lymphs: 24 %
MCH: 31.2 pg (ref 26.6–33.0)
MCHC: 33 g/dL (ref 31.5–35.7)
MCV: 95 fL (ref 79–97)
Monocytes Absolute: 0.5 x10E3/uL (ref 0.1–0.9)
Monocytes: 9 %
Neutrophils Absolute: 3.9 x10E3/uL (ref 1.4–7.0)
Neutrophils: 61 %
Platelets: 190 x10E3/uL (ref 150–450)
RBC: 4.9 x10E6/uL (ref 4.14–5.80)
RDW: 12.5 % (ref 11.6–15.4)
WBC: 6.2 x10E3/uL (ref 3.4–10.8)

## 2024-02-26 LAB — COMPREHENSIVE METABOLIC PANEL WITH GFR
ALT: 34 IU/L (ref 0–44)
AST: 28 IU/L (ref 0–40)
Albumin: 4.3 g/dL (ref 3.8–4.8)
Alkaline Phosphatase: 65 IU/L (ref 44–121)
BUN/Creatinine Ratio: 17 (ref 10–24)
BUN: 16 mg/dL (ref 8–27)
Bilirubin Total: 1.7 mg/dL — ABNORMAL HIGH (ref 0.0–1.2)
CO2: 20 mmol/L (ref 20–29)
Calcium: 9.4 mg/dL (ref 8.6–10.2)
Chloride: 104 mmol/L (ref 96–106)
Creatinine, Ser: 0.94 mg/dL (ref 0.76–1.27)
Globulin, Total: 2.1 g/dL (ref 1.5–4.5)
Glucose: 87 mg/dL (ref 70–99)
Potassium: 3.8 mmol/L (ref 3.5–5.2)
Sodium: 141 mmol/L (ref 134–144)
Total Protein: 6.4 g/dL (ref 6.0–8.5)
eGFR: 83 mL/min/1.73 (ref >59)

## 2024-02-26 LAB — LIPID PANEL
Chol/HDL Ratio: 2.3 ratio (ref 0.0–5.0)
Cholesterol, Total: 133 mg/dL (ref 100–199)
HDL: 58 mg/dL (ref >39)
LDL Chol Calc (NIH): 58 mg/dL (ref 0–99)
Triglycerides: 90 mg/dL (ref 0–149)
VLDL Cholesterol Cal: 17 mg/dL (ref 5–40)

## 2024-02-26 LAB — PSA: Prostate Specific Ag, Serum: 0.8 ng/mL (ref 0.0–4.0)

## 2024-02-26 LAB — HEMOGLOBIN A1C
Est. average glucose Bld gHb Est-mCnc: 108 mg/dL
Hgb A1c MFr Bld: 5.4 % (ref 4.8–5.6)

## 2024-03-02 ENCOUNTER — Other Ambulatory Visit: Payer: Self-pay | Admitting: Family Medicine

## 2024-03-02 DIAGNOSIS — I10 Essential (primary) hypertension: Secondary | ICD-10-CM

## 2024-03-02 DIAGNOSIS — I2510 Atherosclerotic heart disease of native coronary artery without angina pectoris: Secondary | ICD-10-CM

## 2024-03-03 ENCOUNTER — Ambulatory Visit (INDEPENDENT_AMBULATORY_CARE_PROVIDER_SITE_OTHER): Admitting: Family Medicine

## 2024-03-03 ENCOUNTER — Encounter: Payer: Self-pay | Admitting: Family Medicine

## 2024-03-03 VITALS — BP 122/72 | HR 53 | Ht 69.0 in | Wt 198.0 lb

## 2024-03-03 DIAGNOSIS — R911 Solitary pulmonary nodule: Secondary | ICD-10-CM | POA: Diagnosis not present

## 2024-03-03 DIAGNOSIS — J8410 Pulmonary fibrosis, unspecified: Secondary | ICD-10-CM | POA: Diagnosis not present

## 2024-03-03 DIAGNOSIS — Z0000 Encounter for general adult medical examination without abnormal findings: Secondary | ICD-10-CM

## 2024-03-03 DIAGNOSIS — K219 Gastro-esophageal reflux disease without esophagitis: Secondary | ICD-10-CM

## 2024-03-03 DIAGNOSIS — I7121 Aneurysm of the ascending aorta, without rupture: Secondary | ICD-10-CM | POA: Diagnosis not present

## 2024-03-03 DIAGNOSIS — I4819 Other persistent atrial fibrillation: Secondary | ICD-10-CM

## 2024-03-03 DIAGNOSIS — R17 Unspecified jaundice: Secondary | ICD-10-CM | POA: Insufficient documentation

## 2024-03-03 DIAGNOSIS — I288 Other diseases of pulmonary vessels: Secondary | ICD-10-CM | POA: Insufficient documentation

## 2024-03-03 NOTE — Assessment & Plan Note (Signed)
-     Counseled on trying OTC Pepcid  as needed before meals that may trigger symptoms, or Tums after symptoms start. Advised on the risks of long-term daily use of PPIs.

## 2024-03-03 NOTE — Assessment & Plan Note (Addendum)
 Also seen on recent CT scan.  F/u w/ cardiology

## 2024-03-03 NOTE — Progress Notes (Signed)
 Annual physical  Subjective   Patient ID: Damon [name], male    DOB: [birth year]  Age: 78 y.o. MRN: [id number]  Chief Complaint  Patient presents with   Annual Exam   HPI Damon [name] is a 78 y.o. old male here  for annual exam.   Subjective -    A recent CT scan showed congestion suggestive of a possible infection. Was asked to provide a sputum sample but has been unsuccessful. The first sample was inadequate, and the second was contaminated as it was not refrigerated. Follow-up with the ordering  pulmonologist is scheduled in two weeks. Reports no significant cough, which makes expectoration difficult. -   A new finding on the same CT scan was an ascending aortic aneurysm, measuring 4 mm. This was reviewed with cardiology, and the plan is to monitor with a repeat scan in one year. -   Reports intermittent chest discomfort, described as similar to indigestion. It does not seem to be related to exertion and is sometimes relieved by ice cream. It may be related to food intake or medications. -   Discussed weight loss. Has lost approximately 10 pounds this year by reducing carbohydrate intake but had a recent setback. -   Discussed A-fib. Last cardioversion was in April. Continues to have intermittent episodes, but the AFib clinic is not concerned if they occur less than once a year. Reports the rate is typically in the 80s but the rhythm is irregular.  Medications Takes diltiazem  180 mg daily. Has a PRN medication for high heart rate but has not needed to use it.  PMH, PSH, FH, Social Hx PMH: Atrial fibrillation, status post cardioversion (last in April 2024). History of shingles (x2). Ascending aortic aneurysm (4 mm).  Last colonoscopy January 2024, next due in January 2027. Social Hx: Denies smoking. Eats ice cream. Tries to limit carbohydrate intake. Rides a bicycle for exercise.  ROS Constitutional: Reports leg fatigue with exercise. Denies shortness of breath with  exertion. Cardiovascular: Reports intermittent chest pain, feels like indigestion. Denies palpitations requiring PRN medication. Respiratory: Denies significant cough. Gastrointestinal: Reports intermittent indigestion-like chest pain.   The 10-year ASCVD risk score (Arnett DK, et al., 2019) is: 27.7%  Health Maintenance Due  Topic Date Due   Zoster Vaccines- Shingrix (1 of 2) Never done   COVID-19 Vaccine (3 - Pfizer risk series) 10/13/2019   DTaP/Tdap/Td (4 - Td or Tdap) 12/29/2023   INFLUENZA VACCINE  02/19/2024      Objective:     BP 122/72   Pulse (!) 53   Ht 5' 9 (1.753 m)   Wt 198 lb (89.8 kg)   SpO2 95%   BMI 29.24 kg/m    Physical Exam Gen: alert, oriented Heent: perrla, eomi Cv: rrr Pulm: lctab Gi: soft, nontender Msk: strength equal b/l Skin: sun damaged tanned skin.  Psych: pleasant affect.    No results found for any visits on 03/03/24.      Assessment & Plan:   Physical exam, annual  Pulmonary nodule Assessment & Plan: F/u ct scan at 12 months.  Follows with pulmonology   Pulmonary fibrosis [facility]) Assessment & Plan: Asymptomatic.  No dyspnea.  No cough.  O2 sat normal.  Follows with pulm.     Aneurysm of ascending aorta without rupture [facility]) Assessment & Plan: 4cm on ct scan 12/04/23.  No intervention at this time.    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  F/u w/ cardiology  Persistent atrial fibrillation (HCC) Assessment & Plan: Continue current mgmt per cardiology   Gastroesophageal reflux disease without esophagitis Assessment & Plan:     -   Counseled on trying OTC Pepcid  as needed before meals that may trigger symptoms, or Tums after symptoms start. Advised on the risks of long-term daily use of PPIs.   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  Hepatic workup in the past has been negative.        Return in about 6  months (around 09/03/2024) for hld.    Toribio MARLA Slain, MD

## 2024-03-03 NOTE — Assessment & Plan Note (Signed)
 Continue current mgmt per cardiology

## 2024-03-03 NOTE — Assessment & Plan Note (Signed)
 Asymptomatic.  No dyspnea.  No cough.  O2 sat normal.  Follows with pulm.

## 2024-03-03 NOTE — Assessment & Plan Note (Signed)
 F/u ct scan at 12 months.  Follows with pulmonology

## 2024-03-03 NOTE — Assessment & Plan Note (Signed)
 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  Hepatic workup in the past has been negative.

## 2024-03-03 NOTE — Patient Instructions (Addendum)
 It was nice to see you today,  We addressed the following topics today: -You will need to get your tetanus shot at a pharmacy - You will get your flu shot here when we have them in - Overall your labs look good.  We will follow-up in 6 months.  He is good to go get your appointment scheduled so yeah just I have  Have a great day,  Rolan Slain, MD

## 2024-03-03 NOTE — Assessment & Plan Note (Signed)
 4cm on ct scan 12/04/23.  No intervention at this time.

## 2024-03-17 ENCOUNTER — Encounter: Payer: Self-pay | Admitting: Internal Medicine

## 2024-03-17 ENCOUNTER — Ambulatory Visit (INDEPENDENT_AMBULATORY_CARE_PROVIDER_SITE_OTHER): Admitting: Internal Medicine

## 2024-03-17 VITALS — BP 112/68 | HR 64 | Ht 69.0 in | Wt 200.0 lb

## 2024-03-17 DIAGNOSIS — I288 Other diseases of pulmonary vessels: Secondary | ICD-10-CM | POA: Diagnosis not present

## 2024-03-17 DIAGNOSIS — R918 Other nonspecific abnormal finding of lung field: Secondary | ICD-10-CM

## 2024-03-17 DIAGNOSIS — Z87891 Personal history of nicotine dependence: Secondary | ICD-10-CM

## 2024-03-17 DIAGNOSIS — R911 Solitary pulmonary nodule: Secondary | ICD-10-CM | POA: Diagnosis not present

## 2024-03-17 DIAGNOSIS — R0989 Other specified symptoms and signs involving the circulatory and respiratory systems: Secondary | ICD-10-CM

## 2024-03-17 DIAGNOSIS — J8410 Pulmonary fibrosis, unspecified: Secondary | ICD-10-CM

## 2024-03-17 DIAGNOSIS — J479 Bronchiectasis, uncomplicated: Secondary | ICD-10-CM

## 2024-03-17 LAB — CBC WITH DIFFERENTIAL/PLATELET
Basophils Absolute: 0 K/uL (ref 0.0–0.1)
Basophils Relative: 0.7 % (ref 0.0–3.0)
Eosinophils Absolute: 0.2 K/uL (ref 0.0–0.7)
Eosinophils Relative: 2.7 % (ref 0.0–5.0)
HCT: 43.6 % (ref 39.0–52.0)
Hemoglobin: 15 g/dL (ref 13.0–17.0)
Lymphocytes Relative: 17.8 % (ref 12.0–46.0)
Lymphs Abs: 1.3 K/uL (ref 0.7–4.0)
MCHC: 34.4 g/dL (ref 30.0–36.0)
MCV: 89.6 fl (ref 78.0–100.0)
Monocytes Absolute: 0.6 K/uL (ref 0.1–1.0)
Monocytes Relative: 8 % (ref 3.0–12.0)
Neutro Abs: 5.2 K/uL (ref 1.4–7.7)
Neutrophils Relative %: 70.8 % (ref 43.0–77.0)
Platelets: 192 K/uL (ref 150.0–400.0)
RBC: 4.87 Mil/uL (ref 4.22–5.81)
RDW: 13.1 % (ref 11.5–15.5)
WBC: 7.4 K/uL (ref 4.0–10.5)

## 2024-03-17 NOTE — Patient Instructions (Addendum)
 ICD-10-CM   1. Left lower lobe pulmonary nodule  R91.1     2. Stopped smoking with greater than 20 pack year history  Z87.891     3. Enlarged pulmonary artery (HCC)  I28.8     4. Interstitial lung abnormality (ILA)  R91.8     5. Bibasilar crackles  R09.89     6. Bronchiectasis without complication (HCC)  J47.9     7. Chronic sinus complaints  R09.89      Left lower lobe pulmonary nodule Stopped smoking with greater than 20 pack year history  Plan  - get  HRCT in 1 year June 2025  Enlarged pulmonary artery (HCC)  Need to rule out pulmonary hyepretnsion; last echo 2020  Plan  - get echo non urgent  Interstitial lung abnormality (ILA) Bibasilar crackles Bronchiectasis without complication (HCC)  - stable x 1  year; mayb present 5 years ago  - there is risk it can evolve into ILD the disease othewise called pulmonary fibrosis but not known right now  Plan  - do blood work [employer]  - do blood work IgG, IgM, igA, igE  - do cbc with diff  - do ANA, RF, CCP  - requires monitoring   -do full PFT in 6 months - take ILD question packet with you and bring back next visit - will try to discuss I our case conference  Chronic sinus complaints  Plan  - monitor -per PCP  [facility] follow up Return in about 6 months (around 09/17/2024) for after Spiro and DLCO, with Dr Geronimo, Face to Face Visit, 30 min visit.

## 2024-03-17 NOTE — Addendum Note (Signed)
 Addended by: Lakin Rhine M on: 03/17/2024 03:03 PM   Modules accepted: Orders

## 2024-03-17 NOTE — Progress Notes (Signed)
 12/23/2023  History of Present Illness Damon [name] is a 78 y.o. male  former smoker quit 1983. Past medical history significant for hypertension, paroxysmal A-fib, coronary artery disease, OSA on CPAP, GERD, bladder cancer, vitamin D  deficiency, insomnia. Previously followed by  Dr. Brenna. He will now be followed by Dr. Geronimo. Pt. Presents for follow up after CT Chest. Pt. Has been followed by Dr. Brenna for a pulmonary nodule as well as suspected  MAI. He states he has been doing well. No respiratory issues , no night sweats. He did not have the CT Chest follow up that was discussed in his 06/07/2024 appointment with Dr. Shelah. Last scan was 04/2024, so we  ordered the repeat  HRCT for  May 2025 . He is here to review the results of the scan.    Scan again shows  mild bronchiectasis findings which are concerning for MAC. Pt.is totally asymptomatic. We have discussed starting Flutter valve and Mucinex for mucus clearance. He is in agreement with starting this. We have tried in the past to get sputum for culture. He has been unable to produce any samples for evaluation.   This years can also showed scattered basilar subpleural reticular densities. Per radiology, it is difficult to exclude superimposed ILD such as UIP. I do hear some fine crackles in the bases of his lungs. I will have him see Dr. Geronimo. We will send him home today with the ILD Questionnaire.   There is a new LLL 4 mm lung nodule. We will do a 12 month follow up Ct Chest to follow for stability. He denies any hemoptysis or unexplained weight loss.   He has a 4.0 cm ascending aortic aneurysm. We will continue to follow this on his annual scans. He is also followed by cardiology for this finding. 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   He has aortic atherosclerosis and coronary artery calcifications. He is followed by  cardiology and is on statin therapy. There was also notation of  Enlarged right and left pulmonary arteries, indicative of pulmonary arterial hypertension.Pt. had a recent echo per his cardiologist.He has an appointment today. I have sent him with a copy of the CT report for his cardiologist to review. He does not endorse any shortness of breath  OV 03/17/2024  Subjective:  Patient ID: Damon [name], male , DOB: 1946/03/20 , age 66 y.o. , MRN: [id number] , ADDRESS: [address] [place] [id number] PCP Chandra Toribio POUR, MD Patient Care Team: Chandra Toribio POUR, MD as PCP - General (Family Medicine) Verlin Lonni BIRCH, MD as PCP - Cardiology (Cardiology) Inocencio Soyla Lunger, MD as PCP - Electrophysiology (Cardiology) Fernande Reyes CROME, MD as Referring Physician (Cardiology) Kelsie Agent, MD (Inactive) as Consulting Physician (Cardiology) Alvaro Ricardo KATHEE Raddle., MD as Consulting Physician (Urology) Vernetta Lonni GRADE, MD as Consulting Physician (Orthopedic Surgery) Andra Prentice Dunnings, MD as Referring Physician (Urology) Vicci Lunger POUR, MD as Consulting Physician (Gastroenterology)  This Provider for this visit: Treatment Team:  Attending Provider: Geronimo Amel, MD    03/17/2024 -   Chief Complaint  Patient presents with   Medical Management of Chronic Issues    Bronchiectasis. He is currently doing well with his breathing. He has very minimal cough that is non prod.      HPI JACADEN FORBUSH 78 y.o. -transfer of care from Dr. Adine  Icard is left the practice to Dr. Geronimo because of interstitial lung disease concern along with bronchiectasis reported on the CT scan  Is a former 20 pack smoker but who quit several decades ago.  He used to work for [employer] but he is now retired.  He is quite active he.  He bikes a road bike 2-3 times a week each x 7 miles.  He also plays golf on the cart.  For none of this he gets short of breath.  He only has a cough  when he has sinus issues or sinus congestion.  He does suffer from chronic sinus congestion.  He says that he had a cardiac CT that then showed some nodules that then resulted in a high-res CT because of other findings in the lung.  He says there was concern of MAI infection [also confirmed external record review] he gave 2 sputum specimens but these were of poor quality.  Mostly he is asymptomatic and he does not have any sputum production.  The CT scan personally visualized.  Radiology is concerned that might be ILD evolving.  There is also lower lobe bronchiectasis.  There is no change in 1 year.  I personally visualized the CT scan and I agree there is ILA [interstitial lung abnormality] intermedius unchanged 1 year.  Her baby was male present 5 years ago.  He does have crackles on the exam.  No connective tissue disease symptoms.  His sit/stand test is normal.  CT scan reports enlarged pulmonary artery but his last echocardiogram was 5 years ago.  The most recent CT chest showed a small 4 mm lung nodule.   SYMPTOM SCALE - ILD 03/17/2024  Current weight   O2 use ra  Shortness of Breath 0 -> 5 scale with 5 being worst (score 6 If unable to do)  At rest 0  Simple tasks - showers, clothes change, eating, shaving 0  Household (dishes, doing bed, laundry) 0  Shopping 0  Walking level at own pace 0  Walking up Stairs 1  Total (30-36) Dyspnea Score 1  How bad is your cough? 0  How bad is your fatigue 1  How bad is appetiee 0  How bad is nausea 0  How bad is vomiting?  00  How bad is diarrhea? 0  How bad is anxiety? 0  How bad is depression 0  Any chronic pain - if so where and how bad 0       SIT STAND TEST - goal 15 times   03/17/2024    O2 used ra   PRobe - finter or forehead finger   Number sit and stand completed - goal 15 15   Time taken to complete 50 sec   Resting Pulse Ox/HR/Dyspnea  96% and 64/min and dyspnea of 0/10    Peak measures 95% and 80/min and dyspnea of  0/10   Final Pulse Ox/HR 95% and 68/min and dyspnea of 0/10   Desaturated </= 88% no   Desaturated <= 3% points no   Got Tachycardic >/= 90/min no   Miscellaneous comments x       CT Chest data from date: may 2025  - personally visualized and independently interpreted : ues - my findings are: ILA - no change x 1 year. Maybe present  5 years ago Narrative & Impression  CLINICAL DATA:  Lung nodule, bronchiectasis.   EXAM: CT CHEST WITHOUT CONTRAST   TECHNIQUE: Multidetector CT imaging of the chest was performed following the  standard protocol without intravenous contrast. High resolution imaging of the lungs, as well as inspiratory and expiratory imaging, was performed.   RADIATION DOSE REDUCTION: This exam was performed according to the departmental dose-optimization program which includes automated exposure control, adjustment of the mA and/or kV according to patient size and/or use of iterative reconstruction technique.   COMPARISON:  04/23/2023.   FINDINGS: Cardiovascular: Atherosclerotic calcification of the aorta and coronary arteries. Ascending aorta measures 4.0 cm (coronal image 102), stable. Enlarged right and left pulmonary arteries. Heart is at the upper limits of normal in size to mildly enlarged. No pericardial effusion.   Mediastinum/Nodes: No pathologically enlarged mediastinal or axillary lymph nodes. Hilar regions are difficult to definitively evaluate without IV contrast. Esophagus is grossly unremarkable.   Lungs/Pleura: Mild basilar predominant subpleural reticulation and ground-glass, similar to 04/23/2023. Peripheral peribronchovascular nodularity in the mid and lower lung zones, also seen on prior. Mild cylindrical bronchiectasis. Questionable trace bronchiolectasis. No honeycombing. New 4 mm lateral left lower lobe nodule (4/107). Additional left lower lobe nodules measure up to 6 mm (4/119), unchanged. No pleural fluid. Airway is unremarkable.  No air trapping.   Upper Abdomen: Tiny gallstones. 10 mm peripherally calcified splenic artery aneurysm. Visualized portions of the liver, gallbladder, adrenal glands, kidneys, spleen, pancreas, stomach and bowel are otherwise grossly unremarkable. No upper abdominal adenopathy.   Musculoskeletal: Degenerative changes in the spine.   IMPRESSION: 1. Mild cylindrical bronchiectasis with peribronchovascular nodularity, findings which may be due to chronic mycobacterium avium complex. 2. Scattered basilar subpleural reticular densities. Difficult to exclude superimposed interstitial lung disease such as usual interstitial pneumonitis. Findings are indeterminate for UIP per consensus guidelines: Diagnosis of Idiopathic Pulmonary Fibrosis: An Official ATS/ERS/JRS/ALAT Clinical Practice Guideline. Am JINNY Honey Crit Care Med Vol 198, Iss 5, [phone], Mar 21 2017. 3. New 4 mm left lower lobe nodule. Recommend follow-up CT chest without contrast in 1 year. 4. 4.0 cm ascending aortic aneurysm, stable. Recommend annual imaging followup by CTA or MRA. This recommendation follows 2010 ACCF/AHA/AATS/ACR/ASA/SCA/SCAI/SIR/STS/SVM Guidelines for the Diagnosis and Management of Patients with Thoracic Aortic Disease. Circulation. 2010; 121: Z733-z630. Aortic aneurysm NOS (ICD10-I71.9). 5. Cholelithiasis. 6. 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 7. Aortic atherosclerosis (ICD10-I70.0). Coronary artery calcification. 8. Enlarged right and left pulmonary arteries, indicative of pulmonary arterial hypertension.     Electronically Signed   By: Newell Eke M.D.   On: 12/04/2023 14:38      ECHO 2020  IMPRESSIONS     1. Left ventricular ejection fraction, by visual estimation, is 55 to  60%. The left ventricle has normal function. Normal left ventricular size.  Left ventricular septal wall thickness was normal. There is no left  ventricular hypertrophy.   2. Left ventricular diastolic function  could not be evaluated.   3. The left ventricle has no regional wall motion abnormalities.   4. Global right ventricle has normal systolic function.The right  ventricular size is normal. No increase in right ventricular wall  thickness.   5. Left atrial size was normal.   6. No thrombus in the left atrium or left atrial appendage.   7. Right atrial size was normal.   8. The mitral valve is normal in structure. Mild mitral valve  regurgitation. No evidence of mitral stenosis.   9. The tricuspid valve is normal in structure. Tricuspid valve  regurgitation is mild.  10. The aortic valve is normal in structure. Aortic valve regurgitation is  not visualized. No evidence of aortic valve sclerosis or stenosis.  11. The pulmonic valve was normal in structure. Pulmonic valve  regurgitation is not visualized.  12. The inferior vena cava is normal in size with greater than 50%  respiratory variability, suggesting right atrial pressure of 3 mmHg.  13. No intracardiac source of embolism. This study was followed by a  successful cardioversion.     Latest Reference Range & Units 02/25/24 08:19  EOS (ABSOLUTE) 0.0 - 0.4 x10E3/uL 0.3     LAB RESULTS last 96 hours No results found.       has a past medical history of Bladder cancer (HCC), Hearing loss of both ears, History of colon polyps, Hyperlipidemia, Hypertension, Mitral regurgitation, OSA on CPAP, PAD (peripheral artery disease) (HCC), PAF (paroxysmal atrial fibrillation) (HCC) ( ), and Vitamin D  deficiency.   reports that he quit smoking about 42 years ago. His smoking use included cigarettes. He started smoking about 52 years ago. He has a 20 pack-year smoking history. He has never used smokeless tobacco.  Past Surgical History:  Procedure Laterality Date   ABLATION OF DYSRHYTHMIC FOCUS  01/26/2015   ATRIAL FIBRILLATION ABLATION N/A 07/27/2012   Procedure: ATRIAL FIBRILLATION ABLATION;  Surgeon: Lynwood Rakers, MD;  Location: [facility] CATH  LAB;  Service: Cardiovascular;  Laterality: N/A;   ATRIAL FIBRILLATION ABLATION N/A 08/25/2018   Procedure: ATRIAL FIBRILLATION ABLATION;  Surgeon: Rakers Lynwood, MD;  Location: [facility] INVASIVE CV LAB;  Service: Cardiovascular;  Laterality: N/A;   ATRIAL FIBRILLATION ABLATION N/A 10/09/2022   Procedure: ATRIAL FIBRILLATION ABLATION;  Surgeon: Inocencio Soyla Lunger, MD;  Location: [facility] INVASIVE CV LAB;  Service: Cardiovascular;  Laterality: N/A;   CARDIAC ELECTROPHYSIOLOGY STUDY AND ABLATION  07-27-2012  DR RAKERS   SUCCESSFUL ABLATION OF A-FIB   CARDIOVERSION  08/06/2012   Procedure: CARDIOVERSION;  Surgeon: Vina LULLA Gull, MD;  Location: [facility] ENDOSCOPY;  Service: Cardiovascular;  Laterality: N/A;   CARDIOVERSION N/A 08/25/2014   Procedure: CARDIOVERSION;  Surgeon: Ezra GORMAN Shuck, MD;  Location: [facility] ENDOSCOPY;  Service: Cardiovascular;  Laterality: N/A;   CARDIOVERSION N/A 11/13/2014   Procedure: CARDIOVERSION;  Surgeon: Wilbert JONELLE Bihari, MD;  Location: [facility] ENDOSCOPY;  Service: Cardiovascular;  Laterality: N/A;   CARDIOVERSION N/A 12/28/2014   Procedure: CARDIOVERSION;  Surgeon: Vina Gull LULLA, MD;  Location: [facility] ENDOSCOPY;  Service: Cardiovascular;  Laterality: N/A;   CARDIOVERSION N/A 01/30/2017   Procedure: CARDIOVERSION;  Surgeon: Delford Maude BROCKS, MD;  Location: [facility] ENDOSCOPY;  Service: Cardiovascular;  Laterality: N/A;   CARDIOVERSION N/A 04/06/2017   Procedure: CARDIOVERSION;  Surgeon: Maranda Leim DEL, MD;  Location: [facility] ENDOSCOPY;  Service: Cardiovascular;  Laterality: N/A;   CARDIOVERSION N/A 05/25/2018   Procedure: CARDIOVERSION;  Surgeon: Raford Riggs, MD;  Location: [facility] ENDOSCOPY;  Service: Cardiovascular;  Laterality: N/A;   CARDIOVERSION N/A 08/09/2018   Procedure: CARDIOVERSION;  Surgeon: Lonni Slain, MD;  Location: [facility] ENDOSCOPY;  Service: Cardiovascular;  Laterality: N/A;   CARDIOVERSION N/A 07/04/2019   Procedure: CARDIOVERSION;  Surgeon: Maranda Leim DEL, MD;  Location: [facility]  ENDOSCOPY;  Service: Cardiovascular;  Laterality: N/A;   CARDIOVERSION N/A 09/23/2019   Procedure: CARDIOVERSION;  Surgeon: Maranda Leim DEL, MD;  Location: [facility] ENDOSCOPY;  Service: Cardiovascular;  Laterality: N/A;   CARDIOVERSION N/A 08/07/2021   Procedure: CARDIOVERSION;  Surgeon: Bihari Wilbert JONELLE, MD;  Location: [facility] ENDOSCOPY;  Service: Cardiovascular;  Laterality: N/A;   CARDIOVERSION N/A 03/17/2022   Procedure: CARDIOVERSION;  Surgeon: Bihari Wilbert JONELLE, MD;  Location: [facility] ENDOSCOPY;  Service: Cardiovascular;  Laterality: N/A;   CARDIOVERSION N/A 06/26/2022  Procedure: CARDIOVERSION;  Surgeon: Mona Vinie BROCKS, MD;  Location: [facility] ENDOSCOPY;  Service: Cardiovascular;  Laterality: N/A;   CARDIOVERSION N/A 11/10/2022   Procedure: CARDIOVERSION;  Surgeon: Alvan Ronal BRAVO, MD;  Location: [facility] INVASIVE CV LAB;  Service: Cardiovascular;  Laterality: N/A;   CARDIOVERSION N/A 03/05/2023   Procedure: CARDIOVERSION;  Surgeon: Florencio Cara BIRCH, MD;  Location: [facility] ORS;  Service: Cardiovascular;  Laterality: N/A;   CARDIOVERSION N/A 11/17/2023   Procedure: CARDIOVERSION;  Surgeon: Florencio Cara BIRCH, MD;  Location: [facility] ORS;  Service: Cardiovascular;  Laterality: N/A;   CATARACT EXTRACTION W/ INTRAOCULAR LENS  IMPLANT, BILATERAL     COLONOSCOPY WITH PROPOFOL  N/A 02/13/2015   Procedure: COLONOSCOPY WITH PROPOFOL ;  Surgeon: Gladis MARLA Louder, MD;  Location: [facility] ENDOSCOPY;  Service: Endoscopy;  Laterality: N/A;   CYSTOSCOPY W/ RETROGRADES Bilateral 04/12/2013   Procedure: CYSTOSCOPY WITH RETROGRADE PYELOGRAM;  Surgeon: Ricardo Likens, MD;  Location: [facility];  Service: Urology;  Laterality: Bilateral;   CYSTOSCOPY WITH RETROGRADE PYELOGRAM, URETEROSCOPY AND STENT PLACEMENT  03/06/2021   Procedure: CYSTOSCOPY WITH BILATERAL RETROGRADE PYELOGRAM, LEFT URETEROSCOPY AND STENT PLACEMENT;  Surgeon: Likens Ricardo, MD;  Location: [facility];  Service: Urology;;  1 HR   ELECTROPHYSIOLOGIC  STUDY N/A 01/26/2015   Procedure: Atrial Fibrillation Ablation;  Surgeon: Lynwood Rakers, MD;  Location: [facility] INVASIVE CV LAB;  Service: Cardiovascular;  Laterality: N/A;   HOLMIUM LASER APPLICATION Left 03/06/2021   Procedure: HOLMIUM LASER APPLICATION;  Surgeon: Likens Ricardo, MD;  Location: [facility];  Service: Urology;  Laterality: Left;   JOINT REPLACEMENT     LEFT HEART CATHETERIZATION WITH CORONARY ANGIOGRAM N/A 11/24/2013   Procedure: LEFT HEART CATHETERIZATION WITH CORONARY ANGIOGRAM;  Surgeon: Lonni BIRCH Cash, MD;  Location: [facility] CATH LAB;  Service: Cardiovascular;  Laterality: N/A;   PERCUTANEOUS CORONARY ROTOBLATOR INTERVENTION (PCI-R) N/A 11/28/2013   Procedure: PERCUTANEOUS CORONARY ROTOBLATOR INTERVENTION (PCI-R);  Surgeon: Lonni BIRCH Cash, MD;  Location: [facility] CATH LAB;  Service: Cardiovascular;  Laterality: N/A;   PERCUTANEOUS CORONARY STENT INTERVENTION (PCI-S)  11/24/2013   Procedure: PERCUTANEOUS CORONARY STENT INTERVENTION (PCI-S);  Surgeon: Lonni BIRCH Cash, MD;  Location: [facility] CATH LAB;  Service: Cardiovascular;;   TEE WITHOUT CARDIOVERSION  07/26/2012   Procedure: TRANSESOPHAGEAL ECHOCARDIOGRAM (TEE);  Surgeon: Vina LULLA Gull, MD;  Location: [facility] ENDOSCOPY;  Service: Cardiovascular;  Laterality: N/A;   TEE WITHOUT CARDIOVERSION N/A 08/25/2014   Procedure: TRANSESOPHAGEAL ECHOCARDIOGRAM (TEE);  Surgeon: Ezra GORMAN Shuck, MD;  Location: [facility] ENDOSCOPY;  Service: Cardiovascular;  Laterality: N/A;   TEE WITHOUT CARDIOVERSION  01/26/2015   Procedure: Transesophageal Echocardiogram (Tee);  Surgeon: Aleene JINNY Passe, MD;  Location: [facility] INVASIVE CV LAB;  Service: Cardiovascular;;   TEE WITHOUT CARDIOVERSION N/A 07/04/2019   Procedure: TRANSESOPHAGEAL ECHOCARDIOGRAM (TEE);  Surgeon: Maranda Leim DEL, MD;  Location: [facility] ENDOSCOPY;  Service: Cardiovascular;  Laterality: N/A;   TONSILLECTOMY  age 25   TOTAL HIP ARTHROPLASTY  07/25/2011   Procedure: TOTAL HIP  ARTHROPLASTY ANTERIOR APPROACH;  Surgeon: Lonni CINDERELLA Poli;  Location: [facility] ORS;  Service: Orthopedics;  Laterality: Right;   TOTAL HIP ARTHROPLASTY Left 02/08/2010   TRANSURETHRAL RESECTION OF BLADDER TUMOR WITH GYRUS (TURBT-GYRUS) N/A 04/12/2013   Procedure: TRANSURETHRAL RESECTION OF BLADDER TUMOR WITH GYRUS (TURBT-GYRUS);  Surgeon: Ricardo Likens, MD;  Location: [facility];  Service: Urology;  Laterality: N/A;    No Known Allergies  Immunization History  Administered Date(s) Administered   DT (Pediatric) 07/22/2003, 12/28/2013   INFLUENZA, HIGH DOSE SEASONAL  PF 04/08/2017, 05/17/2018, 04/14/2019, 05/28/2021, 04/22/2022, 04/16/2023   Influenza Split 04/14/2019   Influenza Whole 05/19/2013   Influenza,inj,Quad PF,6+ Mos 05/09/2014   Influenza-Unspecified 05/14/2015, 05/01/2016, 05/17/2018   PFIZER(Purple Top)SARS-COV-2 Vaccination 08/18/2019, 09/15/2019   Pneumococcal Conjugate-13 07/11/2014   Pneumococcal Polysaccharide-23 12/04/2009, 06/10/2016   Tdap 09/22/2013    Family History  Problem Relation Age of Onset   Heart attack Mother    Heart disease Mother    Heart failure Father    Heart disease Father      Current Outpatient Medications:    bisoprolol -hydrochlorothiazide  (ZIAC ) 10-6.25 MG tablet, TAKE ONE-HALF TABLET BY MOUTH  DAILY, Disp: 45 tablet, Rfl: 3   Cholecalciferol (VITAMIN D3) 125 MCG (5000 UT) CAPS, Take 10,000 units Daily, Disp: 30 capsule, Rfl:    diltiazem  (CARDIZEM  CD) 180 MG 24 hr capsule, Take 1 capsule (180 mg total) by mouth daily., Disp: 90 capsule, Rfl: 3   diltiazem  (CARDIZEM ) 30 MG tablet, Take 1 tablet every 4 hours AS NEEDED for AFIB heart rate >100 as long as top BP >100., Disp: 30 tablet, Rfl: 1   ezetimibe  (ZETIA ) 10 MG tablet, TAKE 1 TABLET BY MOUTH DAILY FOR CHOLESTEROL, Disp: 90 tablet, Rfl: 3   Multiple Vitamin (MULTIVITAMIN WITH MINERALS) TABS tablet, Take 1 tablet Daily, Disp: , Rfl:    nitroGLYCERIN  (NITROSTAT ) 0.4 MG  SL tablet, DISSOLVE ONE TABLET UNDER THE TONGUE EVERY 5 MINUTES AS NEEDED FOR CHEST PAIN.DO NOT EXCEED A TOTAL OF 3 DOSES IN 15 MINUTES, Disp: 25 tablet, Rfl: 6   NON FORMULARY, Pt uses a c-pap nightly, Disp: , Rfl:    Polyvinyl Alcohol-Povidone (REFRESH OP), Place 1 drop into both eyes 3 (three) times daily as needed (dry/irritated eyes.)., Disp: , Rfl:    rosuvastatin  (CRESTOR ) 40 MG tablet, TAKE ONE-HALF TABLET BY MOUTH  DAILY FOR CHOLESTEROL, Disp: 45 tablet, Rfl: 3   XARELTO  20 MG TABS tablet, TAKE 1 TABLET BY MOUTH DAILY  WITH SUPPER, Disp: 90 tablet, Rfl: 3      Objective:   Vitals:   03/17/24 1346  BP: 112/68  Pulse: 64  SpO2: 96%  Weight: 200 lb (90.7 kg)  Height: 5' 9 (1.753 m)    Estimated body mass index is 29.53 kg/m as calculated from the following:   Height as of this encounter: 5' 9 (1.753 m).   Weight as of this encounter: 200 lb (90.7 kg).  @WEIGHTCHANGE @  [employer]   03/17/24 1346  Weight: 200 lb (90.7 kg)     Physical Exam   General: No distress. Looks well O2 at rest: no Cane present: no Sitting in wheel chair: no Frail: no Obese: no Neuro: Alert and Oriented x 3. GCS 15. Speech normal Psych: Pleasant Resp:  Barrel Chest - no.  Wheeze - n, Crackles - YES, No overt respiratory distress CVS: Normal heart sounds. Murmurs - no Ext: Stigmata of Connective Tissue Disease - no HEENT: Normal upper airway. PEERL +. No post nasal drip        Assessment/     Assessment & Plan Left lower lobe pulmonary nodule  Stopped smoking with greater than 20 pack year history  Enlarged pulmonary artery (HCC)  Interstitial lung abnormality (ILA)  Bibasilar crackles  Bronchiectasis without complication (HCC)  Chronic sinus complaints    PLAN Patient Instructions     ICD-10-CM   1. Left lower lobe pulmonary nodule  R91.1     2. Stopped smoking with greater than 20 pack year history  Z87.891  3. Enlarged pulmonary artery (HCC)  I28.8      4. Interstitial lung abnormality (ILA)  R91.8     5. Bibasilar crackles  R09.89     6. Bronchiectasis without complication (HCC)  J47.9     7. Chronic sinus complaints  R09.89      Left lower lobe pulmonary nodule Stopped smoking with greater than 20 pack year history  Plan  - get  HRCT in 1 year June 2025  Enlarged pulmonary artery (HCC)  Need to rule out pulmonary hyepretnsion; last echo 2020  Plan  - get echo non urgent  Interstitial lung abnormality (ILA) Bibasilar crackles Bronchiectasis without complication (HCC)  - stable x 1  year; mayb present 5 years ago  - there is risk it can evolve into ILD the disease othewise called pulmonary fibrosis but not known right now  Plan  - do blood work [employer] gold  - do blood work IgG, IgM, igA, igE  - do cbc with diff  - do ANA, RF, CCP  - requires monitoring   -do full PFT in 6 months - take ILD question packet with you and bring back next visit - will try to discuss I our case conference  Chronic sinus complaints  Plan  - monitor -per PCP  [facility] follow up Return in about 6 months (around 09/17/2024) for after Spiro and DLCO, with Dr Geronimo, Face to Face Visit, 30 min visit.     FOLLOWUP    Return in about 6 months (around 09/17/2024) for after Spiro and DLCO, with Dr Geronimo, Face to Face Visit, 30 min visit.  ( Level 05 visit E&M 2024: Estb >= 40 min n  visit type: on-site physical face to visit  in total care time and counseling or/and coordination of care by this undersigned MD - Dr Dorethia Geronimo. This includes one or more of the following on this same day 03/17/2024: pre-charting, chart review, note writing, documentation discussion of test results, diagnostic or treatment recommendations, prognosis, risks and benefits of management options, instructions, education, compliance or risk-factor reduction. It excludes time spent by the CMA or office staff in the care of the patient. Actual time 40  min)   SIGNATURE    Dr. Dorethia Geronimo, M.D., F.C.C.P,  Pulmonary and Critical Care Medicine Staff Physician, [facility] Director - Interstitial Lung Disease  Program  Pulmonary Fibrosis [facility] at [facility] [place], [place], [address]  Pager: [phone], If no answer or between  15:00h - 7:00h: call [phone] Telephone: [phone]  2:38 PM 03/17/2024

## 2024-03-19 LAB — IGG, IGA, IGM
IgG (Immunoglobin G), Serum: 912 mg/dL (ref 600–1540)
IgM, Serum: 67 mg/dL (ref 50–300)
Immunoglobulin A: 144 mg/dL (ref 70–320)

## 2024-03-19 LAB — RHEUMATOID FACTOR: Rheumatoid fact SerPl-aCnc: <10 [IU]/mL (ref <14)

## 2024-03-19 LAB — CYCLIC CITRUL PEPTIDE ANTIBODY, IGG: Cyclic Citrullin Peptide Ab: <16 U

## 2024-03-19 LAB — IGE: IgE (Immunoglobulin E), Serum: 32 kU/L (ref <=114)

## 2024-03-19 LAB — ANA: Anti Nuclear Antibody (ANA): NEGATIVE

## 2024-03-21 LAB — QUANTIFERON-TB GOLD PLUS
Mitogen-NIL: 5.94 [IU]/mL
NIL: 0.02 [IU]/mL
QuantiFERON-TB Gold Plus: NEGATIVE
TB1-NIL: 0 [IU]/mL
TB2-NIL: 0.04 [IU]/mL

## 2024-03-24 ENCOUNTER — Ambulatory Visit (HOSPITAL_COMMUNITY)
Admission: RE | Admit: 2024-03-24 | Discharge: 2024-03-24 | Disposition: A | Discharge status: Home / Self Care | Source: Ambulatory Visit | Attending: Cardiology | Admitting: Cardiology

## 2024-03-24 DIAGNOSIS — Z8249 Family history of ischemic heart disease and other diseases of the circulatory system: Secondary | ICD-10-CM | POA: Diagnosis not present

## 2024-03-24 DIAGNOSIS — Z87891 Personal history of nicotine dependence: Secondary | ICD-10-CM | POA: Diagnosis not present

## 2024-03-24 DIAGNOSIS — I2584 Coronary atherosclerosis due to calcified coronary lesion: Secondary | ICD-10-CM | POA: Insufficient documentation

## 2024-03-24 DIAGNOSIS — R918 Other nonspecific abnormal finding of lung field: Secondary | ICD-10-CM | POA: Insufficient documentation

## 2024-03-24 DIAGNOSIS — Z8551 Personal history of malignant neoplasm of bladder: Secondary | ICD-10-CM | POA: Diagnosis not present

## 2024-03-24 DIAGNOSIS — G4730 Sleep apnea, unspecified: Secondary | ICD-10-CM | POA: Insufficient documentation

## 2024-03-24 DIAGNOSIS — I739 Peripheral vascular disease, unspecified: Secondary | ICD-10-CM | POA: Diagnosis not present

## 2024-03-24 DIAGNOSIS — I77810 Thoracic aortic ectasia: Secondary | ICD-10-CM | POA: Diagnosis not present

## 2024-03-24 DIAGNOSIS — I272 Pulmonary hypertension, unspecified: Secondary | ICD-10-CM

## 2024-03-24 DIAGNOSIS — J84112 Idiopathic pulmonary fibrosis: Secondary | ICD-10-CM | POA: Insufficient documentation

## 2024-03-24 DIAGNOSIS — E785 Hyperlipidemia, unspecified: Secondary | ICD-10-CM | POA: Diagnosis not present

## 2024-03-24 DIAGNOSIS — I4891 Unspecified atrial fibrillation: Secondary | ICD-10-CM | POA: Insufficient documentation

## 2024-03-24 DIAGNOSIS — I288 Other diseases of pulmonary vessels: Secondary | ICD-10-CM | POA: Diagnosis present

## 2024-03-24 DIAGNOSIS — I10 Essential (primary) hypertension: Secondary | ICD-10-CM | POA: Diagnosis not present

## 2024-03-24 DIAGNOSIS — I2510 Atherosclerotic heart disease of native coronary artery without angina pectoris: Secondary | ICD-10-CM | POA: Diagnosis not present

## 2024-03-24 LAB — ECHOCARDIOGRAM COMPLETE
Area-P 1/2: 4.1 cm2
MV M vel: 3.81 m/s
MV Peak grad: 58.1 mmHg
Radius: 0.4 cm
S' Lateral: 3.1 cm

## 2024-03-26 ENCOUNTER — Ambulatory Visit: Payer: Self-pay | Admitting: Internal Medicine

## 2024-04-11 ENCOUNTER — Other Ambulatory Visit: Payer: Self-pay | Admitting: Nurse Practitioner

## 2024-04-27 ENCOUNTER — Other Ambulatory Visit: Payer: Self-pay | Admitting: Nurse Practitioner

## 2024-05-01 NOTE — Progress Notes (Signed)
 Echo is normal but for . Left atrial size was mild to moderately dilated.  - which is asscoiated with atrial fibrillation. Pls discuss with cariology

## 2024-05-31 ENCOUNTER — Ambulatory Visit (HOSPITAL_COMMUNITY)
Admission: RE | Admit: 2024-05-31 | Discharge: 2024-05-31 | Disposition: A | Discharge status: Home / Self Care | Source: Ambulatory Visit | Attending: Physician Assistant | Admitting: Physician Assistant

## 2024-05-31 VITALS — BP 122/76 | HR 77 | Ht 69.0 in | Wt 202.6 lb

## 2024-05-31 DIAGNOSIS — I4891 Unspecified atrial fibrillation: Secondary | ICD-10-CM

## 2024-05-31 DIAGNOSIS — D6869 Other thrombophilia: Secondary | ICD-10-CM | POA: Diagnosis not present

## 2024-05-31 DIAGNOSIS — I4819 Other persistent atrial fibrillation: Secondary | ICD-10-CM | POA: Diagnosis not present
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
## 2024-05-31 NOTE — Patient Instructions (Signed)
 Cardioversion scheduled for: Thursday, November 13th   - Arrive at the [facility] ([address])  and check in with ADMITTING at 9:00 AM   - Do not eat or drink anything after midnight the night prior to your procedure.   - Take all your morning medication (except diabetic medications) with a sip of water prior to arrival.  - Do NOT miss any doses of your blood thinner - if you should miss a dose or take a dose more than 4 hours late -- please notify our office immediately.  - You will not be able to drive home after your procedure. Please ensure you have a responsible adult to drive you home. You will need someone with you for 24 hours post procedure.     - Expect to be in the procedural area approximately 2 hours.   - If you feel as if you go back into normal rhythm prior to scheduled cardioversion, please notify our office immediately.   If your procedure is canceled in the cardioversion suite you will be charged a cancellation fee.

## 2024-06-01 ENCOUNTER — Ambulatory Visit (HOSPITAL_COMMUNITY): Payer: Self-pay | Admitting: Physician Assistant

## 2024-06-01 LAB — CBC
Hematocrit: 40.3 % (ref 37.5–51.0)
Hemoglobin: 13.2 g/dL (ref 13.0–17.7)
MCH: 28.3 pg (ref 26.6–33.0)
MCHC: 32.8 g/dL (ref 31.5–35.7)
MCV: 86 fL (ref 79–97)
Platelets: 345 x10E3/uL (ref 150–450)
RBC: 4.67 x10E6/uL (ref 4.14–5.80)
RDW: 14.7 % (ref 11.6–15.4)
WBC: 6.3 x10E3/uL (ref 3.4–10.8)

## 2024-06-01 LAB — BASIC METABOLIC PANEL WITH GFR
BUN/Creatinine Ratio: 23 (ref 10–24)
BUN: 22 mg/dL (ref 8–27)
CO2: 28 mmol/L (ref 20–29)
Calcium: 9.7 mg/dL (ref 8.6–10.2)
Chloride: 105 mmol/L (ref 96–106)
Creatinine, Ser: 0.96 mg/dL (ref 0.76–1.27)
Glucose: 89 mg/dL (ref 70–99)
Potassium: 4.2 mmol/L (ref 3.5–5.2)
Sodium: 142 mmol/L (ref 134–144)
eGFR: 80 mL/min/1.73 (ref >59)

## 2024-06-01 NOTE — Progress Notes (Signed)
 Spoke to patient and instructed them to come at 0900  and to be NPO after 0000.     Confirmed that patient will have a ride home and someone to stay with them for 24 hours after the procedure.   Medications reviewed.  Confirmed blood thinner.  Confirmed no breaks in taking blood thinner for 3+ weeks prior to procedure.

## 2024-06-02 ENCOUNTER — Ambulatory Visit (HOSPITAL_COMMUNITY)

## 2024-06-02 ENCOUNTER — Encounter (HOSPITAL_COMMUNITY)
Admission: RE | Disposition: A | Discharge status: Home / Self Care | Payer: Self-pay | Source: Home / Self Care | Attending: Cardiology

## 2024-06-02 ENCOUNTER — Ambulatory Visit (HOSPITAL_COMMUNITY)
Admission: RE | Admit: 2024-06-02 | Discharge: 2024-06-02 | Disposition: A | Discharge status: Home / Self Care | Attending: Cardiology | Admitting: Cardiology

## 2024-06-02 ENCOUNTER — Other Ambulatory Visit: Payer: Self-pay

## 2024-06-02 ENCOUNTER — Encounter (HOSPITAL_COMMUNITY): Payer: Self-pay | Admitting: Cardiology

## 2024-06-02 DIAGNOSIS — Z006 Encounter for examination for normal comparison and control in clinical research program: Secondary | ICD-10-CM

## 2024-06-02 DIAGNOSIS — G4733 Obstructive sleep apnea (adult) (pediatric): Secondary | ICD-10-CM | POA: Diagnosis not present

## 2024-06-02 DIAGNOSIS — Z7901 Long term (current) use of anticoagulants: Secondary | ICD-10-CM | POA: Insufficient documentation

## 2024-06-02 DIAGNOSIS — I4819 Other persistent atrial fibrillation: Secondary | ICD-10-CM | POA: Insufficient documentation

## 2024-06-02 DIAGNOSIS — Z87891 Personal history of nicotine dependence: Secondary | ICD-10-CM | POA: Diagnosis not present

## 2024-06-02 DIAGNOSIS — D6869 Other thrombophilia: Secondary | ICD-10-CM | POA: Diagnosis not present

## 2024-06-02 DIAGNOSIS — K219 Gastro-esophageal reflux disease without esophagitis: Secondary | ICD-10-CM | POA: Diagnosis not present

## 2024-06-02 DIAGNOSIS — I10 Essential (primary) hypertension: Secondary | ICD-10-CM | POA: Insufficient documentation

## 2024-06-02 DIAGNOSIS — I25119 Atherosclerotic heart disease of native coronary artery with unspecified angina pectoris: Secondary | ICD-10-CM

## 2024-06-02 DIAGNOSIS — Z79899 Other long term (current) drug therapy: Secondary | ICD-10-CM | POA: Diagnosis not present

## 2024-06-02 DIAGNOSIS — I4891 Unspecified atrial fibrillation: Secondary | ICD-10-CM

## 2024-06-02 DIAGNOSIS — I2510 Atherosclerotic heart disease of native coronary artery without angina pectoris: Secondary | ICD-10-CM | POA: Insufficient documentation

## 2024-06-02 HISTORY — PX: CARDIOVERSION: EP1203

## 2024-06-02 SURGERY — CARDIOVERSION (CATH LAB)
Anesthesia: General

## 2024-06-02 MED ORDER — LIDOCAINE 2% (20 MG/ML) 5 ML SYRINGE
INTRAMUSCULAR | Status: DC | PRN
  Administered 2024-06-02: 100 mg via INTRAVENOUS

## 2024-06-02 MED ORDER — PROPOFOL 10 MG/ML IV BOLUS
INTRAVENOUS | Status: DC | PRN
  Administered 2024-06-02: 50 mg via INTRAVENOUS

## 2024-06-02 MED ORDER — SODIUM CHLORIDE 0.9 % IV SOLN: INTRAVENOUS | Status: DC

## 2024-06-02 SURGICAL SUPPLY — 1 items: PAD DEFIB RADIO PHYSIO CONN (PAD) ×1 IMPLANT

## 2024-06-02 NOTE — Anesthesia Postprocedure Evaluation (Signed)
 Anesthesia Post Note  Patient: Damon [name]  Procedure(s) Performed: CARDIOVERSION     Patient location during evaluation: PACU Anesthesia Type: General Level of consciousness: awake and alert Pain management: pain level controlled Vital Signs Assessment: post-procedure vital signs reviewed and stable Respiratory status: spontaneous breathing, nonlabored ventilation, respiratory function stable and patient connected to nasal cannula oxygen Cardiovascular status: blood pressure returned to baseline and stable Postop Assessment: no apparent nausea or vomiting Anesthetic complications: no   There were no known notable events for this encounter.  Last Vitals:  Vitals:   06/02/24 1041 06/02/24 1051  BP: 104/69 113/65  Pulse: (!) 53 (!) 54  Resp: 12 11  Temp:    SpO2: 95% 95%    Last Pain:  Vitals:   06/02/24 1051  TempSrc:   PainSc: 0-No pain                 Thom JONELLE Peoples

## 2024-06-02 NOTE — Discharge Instructions (Signed)

## 2024-06-02 NOTE — Research (Signed)
 [facility] Cardioversion Informed Consent   Subject Name: Damon [name]  Subject met inclusion and exclusion criteria.  The informed consent form, study requirements and expectations were reviewed with the subject and questions and concerns were addressed prior to the signing of the consent form.  The subject verbalized understanding of the trial requirements.  The subject agreed to participate in the [facility] Cardioversion trial and signed the informed consent at 0820 on 13/Nov/2025.  The informed consent was obtained prior to performance of any protocol-specific procedures for the subject.  A copy of the signed informed consent was given to the subject and a copy was placed in the subject's medical record.   Rosaline BIRCH Luciana Cammarata

## 2024-06-02 NOTE — CV Procedure (Addendum)
 Procedure:   DCCV  Indication:  Symptomatic atrial fibrillation  Procedure Note:  The patient signed informed consent.  They have had had therapeutic anticoagulation with Xarelto  greater than 3 weeks.  Anesthesia was administered by Dr. Treen and Violeta Saenz, CRNA.  Adequate airway was maintained throughout and vital followed per protocol.  They were cardioverted x 1 with 200J of biphasic synchronized energy.  They converted to NSR with rate 50s.  There were no apparent complications.  The patient had normal neuro status and respiratory status post procedure with vitals stable as recorded elsewhere.    Follow up:  They will continue on current medical therapy and follow up with cardiology as scheduled.  Lonni Nanas, MD 06/02/2024 10:29 AM

## 2024-06-02 NOTE — Interval H&P Note (Signed)
 History and Physical Interval Note:  06/02/2024 10:14 AM  Damon [name]  has presented today for surgery, with the diagnosis of AFIB.  The various methods of treatment have been discussed with the patient and family. After consideration of risks, benefits and other options for treatment, the patient has consented to  Procedure(s): CARDIOVERSION (N/A) as a surgical intervention.  The patient's history has been reviewed, patient examined, no change in status, stable for surgery.  I have reviewed the patient's chart and labs.  Questions were answered to the patient's satisfaction.     Lonni LITTIE Nanas

## 2024-06-02 NOTE — Anesthesia Preprocedure Evaluation (Signed)
 Anesthesia Evaluation  Patient identified by MRN, date of birth, ID band Patient awake    Reviewed: Allergy & Precautions, NPO status , Patient's Chart, lab work & pertinent test results  History of Anesthesia Complications Negative for: history of anesthetic complications  Airway Mallampati: III  TM Distance: <3 FB Neck ROM: full    Dental no notable dental hx.    Pulmonary neg shortness of breath, sleep apnea , former smoker   Pulmonary exam normal        Cardiovascular Exercise Tolerance: Good hypertension, + angina  + CAD and + Peripheral Vascular Disease  + dysrhythmias Atrial Fibrillation  Rhythm:irregular Rate:Normal     Neuro/Psych neg Seizures negative neurological ROS  negative psych ROS   GI/Hepatic Neg liver ROS,GERD  Controlled,,  Endo/Other  negative endocrine ROS    Renal/GU negative Renal ROS  negative genitourinary   Musculoskeletal   Abdominal   Peds  Hematology negative hematology ROS (+)   Anesthesia Other Findings   Reproductive/Obstetrics negative OB ROS                              Anesthesia Physical Anesthesia Plan  ASA: 3  Anesthesia Plan: General   Post-op Pain Management:    Induction: Intravenous  PONV Risk Score and Plan: 2 and Propofol  infusion and TIVA  Airway Management Planned: Natural Airway and Nasal Cannula  Additional Equipment:   Intra-op Plan:   Post-operative Plan:   Informed Consent: I have reviewed the patients History and Physical, chart, labs and discussed the procedure including the risks, benefits and alternatives for the proposed anesthesia with the patient or authorized representative who has indicated his/her understanding and acceptance.     Dental Advisory Given  Plan Discussed with: CRNA  Anesthesia Plan Comments:         Anesthesia Quick Evaluation

## 2024-06-02 NOTE — Transfer of Care (Signed)
 Immediate Anesthesia Transfer of Care Note  Patient: Damon [name]  Procedure(s) Performed: CARDIOVERSION  Patient Location: Cath Lab  Anesthesia Type:General  Level of Consciousness: awake, alert , and oriented  Airway & Oxygen Therapy: Patient Spontanous Breathing and Patient connected to nasal cannula oxygen  Post-op Assessment: Report given to RN and Post -op Vital signs reviewed and stable  Post vital signs: Reviewed and stable  Last Vitals:  Vitals Value Taken Time  BP 120/72 1029  Temp 36 1029  Pulse 63 1029  Resp 16 1029  SpO2 92 1029    Last Pain:  Vitals:   06/02/24 0952  TempSrc:   PainSc: 0-No pain         Complications: No notable events documented.

## 2024-06-23 ENCOUNTER — Ambulatory Visit (HOSPITAL_COMMUNITY)
Admission: RE | Admit: 2024-06-23 | Discharge: 2024-06-23 | Disposition: A | Discharge status: Home / Self Care | Source: Ambulatory Visit | Attending: Physician Assistant | Admitting: Physician Assistant

## 2024-06-23 VITALS — BP 122/68 | HR 76 | Ht 69.0 in | Wt 202.6 lb

## 2024-06-23 DIAGNOSIS — I10 Essential (primary) hypertension: Secondary | ICD-10-CM

## 2024-06-23 DIAGNOSIS — I2510 Atherosclerotic heart disease of native coronary artery without angina pectoris: Secondary | ICD-10-CM

## 2024-06-23 DIAGNOSIS — I4819 Other persistent atrial fibrillation: Secondary | ICD-10-CM | POA: Diagnosis not present

## 2024-06-23 DIAGNOSIS — I4891 Unspecified atrial fibrillation: Secondary | ICD-10-CM

## 2024-06-23 DIAGNOSIS — D6869 Other thrombophilia: Secondary | ICD-10-CM

## 2024-06-23 MED ORDER — BISOPROLOL-HYDROCHLOROTHIAZIDE 10-6.25 MG PO TABS: 0.5000 | ORAL_TABLET | Freq: Every day | ORAL | 0 refills | Status: AC
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
## 2024-06-27 ENCOUNTER — Other Ambulatory Visit: Payer: Self-pay | Admitting: Cardiology

## 2024-06-27 DIAGNOSIS — I10 Essential (primary) hypertension: Secondary | ICD-10-CM

## 2024-06-27 DIAGNOSIS — I480 Paroxysmal atrial fibrillation: Secondary | ICD-10-CM

## 2024-06-29 ENCOUNTER — Telehealth: Payer: Self-pay | Admitting: Cardiology

## 2024-06-29 NOTE — Telephone Encounter (Signed)
° °  Pre-operative Risk Assessment    Patient Name: Damon [name]  DOB: October 29, 1945 MRN: [id number]   Date of last office visit: 07/13/2024 Date of next office visit: 07/06/2024   Request for Surgical Clearance    Procedure:  Incision of a cyst   Date of Surgery:  Clearance 07/01/24                                Surgeon:  Dr. Ann Socks Group or Practice Name:  [facility] surgery  Phone number:  [phone] Fax number:  [phone]   Type of Clearance Requested:   - Medical  - Pharmacy:  Hold Rivaroxaban  (Xarelto ) 1 day prior   Type of Anesthesia:  Local    Additional requests/questions:    Bonney Bernarda JONETTA Melvenia   06/29/2024, 3:05 PM

## 2024-06-29 NOTE — Telephone Encounter (Signed)
 Patient currently followed with the atrial fibrillation clinic and Dr. Inocencio for atrial fibrillation. He follows with [facility] as his general cardiologist. Recommendations regarding medical clearance should be addressed by [facility], unless patient plans to transfer his general cardiology care to [facility].   [name], PA-C 06/29/2024 3:57 PM

## 2024-06-30 NOTE — Telephone Encounter (Signed)
 I will fax notes per preop APP Rollo Louder, [facility]:  Patient currently followed with the atrial fibrillation clinic and Dr. Inocencio for atrial fibrillation. He follows with [facility] as his general cardiologist. Recommendations regarding medical clearance should be addressed by [facility] Cardiology, unless patient plans to transfer his general cardiology care to [facility].    [name], PA-C 06/29/2024 3:57 PM

## 2024-07-01 ENCOUNTER — Other Ambulatory Visit: Payer: Self-pay | Admitting: General Surgery

## 2024-07-05 LAB — DERMATOLOGY PATHOLOGY

## 2024-07-05 NOTE — Progress Notes (Unsigned)
 Electrophysiology Office Note:   Date:  07/06/2024  ID:  Damon [name], DOB 01-May-1946, MRN [id number]  Primary Cardiologist: Lonni Cash, MD Primary Heart Failure: None Electrophysiologist: Ninoshka Wainwright Gladis Norton, MD      History of Present Illness:   Damon [name] is a 78 y.o. male with h/o atrial fibrillation, hypertension, mitral regurgitation, sleep apnea, peripheral arterial disease seen today for routine electrophysiology followup.   He went into atrial fibrillation recently.  He is post cardioversion 06/02/2024.  On presentation to the A-fib clinic after cardioversion, he was in sinus rhythm.   Discussed the use of AI scribe software for clinical note transcription with the patient, who gave verbal consent to proceed.  History of Present Illness Damon [name] is a 78 year old male with atrial fibrillation who presents for follow-up after recent cardioversion.  He has a history of atrial fibrillation and underwent cardioversion approximately one month ago. Since the procedure, he has felt generally well, although he experienced an episode of irregular heartbeats about a week ago. During this episode, his heart rate did not enter atrial fibrillation, but he noted some irregular beats. He attributes this to running out of his blood pressure medication, which he has since resumed.  He is concerned about the possibility of future atrial fibrillation episodes and the need for potential interventions. He mentions discussions with healthcare providers about dofetilide options that require hospitalization for initiation, which he is hesitant to pursue due to concerns about long-term effects.    He is undergoing evaluations by pulmonary specialists, who have noted findings on both his heart and lungs. An ultrasound of his heart was performed, and he saw the results but did not fully understand them. He is concerned about his aorta, which was measured at 39 millimeters in  diameter.   he denies chest pain, palpitations, dyspnea, PND, orthopnea, nausea, vomiting, dizziness, syncope, edema, weight gain, or early satiety.   Review of systems complete and found to be negative unless listed in HPI.   EP Information / Studies Reviewed:    EKG is not ordered today. EKG from 06/23/2024 reviewed which showed sinus rhythm       Risk Assessment/Calculations:    CHA2DS2-VASc Score = 4   This indicates a 4.8% annual risk of stroke. The patient's score is based upon: CHF History: 0 HTN History: 1 Diabetes History: 0 Stroke History: 0 Vascular Disease History: 1 Age Score: 2 Gender Score: 0            Physical Exam:   VS:  BP 130/72 (BP Location: Right Arm, Patient Position: Sitting, Cuff Size: Normal)   Pulse (!) 56   Ht 5' 9 (1.753 m)   Wt 201 lb (91.2 kg)   SpO2 95%   BMI 29.68 kg/m    Wt Readings from Last 3 Encounters:  07/06/24 201 lb (91.2 kg)  06/23/24 202 lb 9.6 oz (91.9 kg)  06/02/24 200 lb (90.7 kg)     GEN: Well nourished, well developed in no acute distress NECK: No JVD; No carotid bruits CARDIAC: Regular rate and rhythm, no murmurs, rubs, gallops RESPIRATORY:  Clear to auscultation without rales, wheezing or rhonchi  ABDOMEN: Soft, non-tender, non-distended EXTREMITIES:  No edema; No deformity   ASSESSMENT AND PLAN:    1.  Persistent atrial fibrillation/flutter: Post ablation 2014, 2016, 2020, 10/09/2022.  On diltiazem  and bisoprolol .  He did have recurrence of his atrial fibrillation but is in sinus rhythm today after cardioversion.  He is feeling  well.  For now, he does not want any change in his therapy.  He has had a discussion of dofetilide, but does not wish to proceed with this at this point.  2.  Secondary hypercoagulable state: On Xarelto   3.  Hypertension: Well-controlled  4.  Obstructive sleep apnea: Continue CPAP  5.  Coronary artery disease: No current angina.  Plan per primary cardiology.  Follow up with EP  Team in 6 months  Signed, Orla Estrin Gladis Norton, MD

## 2024-07-06 ENCOUNTER — Encounter: Payer: Self-pay | Admitting: Cardiology

## 2024-07-06 ENCOUNTER — Ambulatory Visit: Admitting: Cardiology

## 2024-07-06 VITALS — BP 130/72 | HR 56 | Ht 69.0 in | Wt 201.0 lb

## 2024-07-06 DIAGNOSIS — D6869 Other thrombophilia: Secondary | ICD-10-CM

## 2024-07-06 DIAGNOSIS — I10 Essential (primary) hypertension: Secondary | ICD-10-CM | POA: Diagnosis not present

## 2024-07-06 DIAGNOSIS — I2510 Atherosclerotic heart disease of native coronary artery without angina pectoris: Secondary | ICD-10-CM

## 2024-07-06 DIAGNOSIS — I4819 Other persistent atrial fibrillation: Secondary | ICD-10-CM

## 2024-07-06 DIAGNOSIS — G4733 Obstructive sleep apnea (adult) (pediatric): Secondary | ICD-10-CM | POA: Diagnosis not present

## 2024-07-06 DIAGNOSIS — I483 Typical atrial flutter: Secondary | ICD-10-CM | POA: Diagnosis not present

## 2024-07-06 NOTE — Patient Instructions (Signed)
 Medication Instructions:  Your physician recommends that you continue on your current medications as directed. Please refer to the Current Medication list given to you today.  *If you need a refill on your cardiac medications before your next appointment, please call your pharmacy*  Lab Work: None ordered  Testing/Procedures: None ordered  Follow-Up: At [facility], you and your health needs are our priority.  As part of our continuing mission to provide you with exceptional heart care, our providers are all part of one team.  This team includes your primary Cardiologist (physician) and Advanced Practice Providers or APPs (Physician Assistants and Nurse Practitioners) who all work together to provide you with the care you need, when you need it.  Your next appointment:   6 month(s)  Provider:   You will follow up in the Atrial Fibrillation Clinic located at [facility]. Your provider will be: Clint R. Fenton, PA-C or Minnie Amber, PA-C     Thank you for choosing [employer]!!   Reece Cane, RN [phone]

## 2024-07-07 NOTE — Telephone Encounter (Signed)
 Patient with diagnosis of afib on Xarelto  for anticoagulation.    Procedure:  Incision of a cyst  Date of procedure: 07/01/24   CHA2DS2-VASc Score = 4   This indicates a 4.8% annual risk of stroke. The patient's score is based upon: CHF History: 0 HTN History: 1 Diabetes History: 0 Stroke History: 0 Vascular Disease History: 1 Age Score: 2 Gender Score: 0      CrCl 70 ml/min Platelet count 345  Patient has not had an Afib/aflutter ablation in the last 3 months, DCCV within the last 4 weeks or a watchman implanted in the last 45 days   Per office protocol, patient can hold Xarelto  for 1 day prior to procedure.    **This guidance is not considered finalized until pre-operative APP has relayed final recommendations.**

## 2024-07-07 NOTE — Telephone Encounter (Addendum)
 It appears patient underwent procedure on 12/12.   Will remove from pre-op pool.   Barnie HERO. Lesette Frary, DNP, NP-C  07/07/2024, 1:52 PM [facility] [address] Office [phone] Fax [phone]

## 2024-07-22 ENCOUNTER — Ambulatory Visit: Payer: Medicare Other | Admitting: Nurse Practitioner

## 2024-07-28 ENCOUNTER — Ambulatory Visit: Payer: Medicare Other | Admitting: Nurse Practitioner

## 2024-07-30 ENCOUNTER — Other Ambulatory Visit: Payer: Self-pay | Admitting: Cardiology

## 2024-07-30 DIAGNOSIS — I480 Paroxysmal atrial fibrillation: Secondary | ICD-10-CM

## 2024-07-30 DIAGNOSIS — I10 Essential (primary) hypertension: Secondary | ICD-10-CM

## 2024-08-17 NOTE — Progress Notes (Unsigned)
 "  @Patient  ID: Damon [name], male    DOB: 1946-01-26, 79 y.o.   MRN: [id number]  No chief complaint on file.   Referring provider: Geronimo Amel, MD  HPI:   03/17/2024 -   Chief Complaint  Patient presents with   Medical Management of Chronic Issues    Bronchiectasis. He is currently doing well with his breathing. He has very minimal cough that is non prod.      HPI IWAO Damon [name] 79 y.o. -transfer of care from Dr. Adine [name] is left the practice to Dr. Geronimo because of interstitial lung disease concern along with bronchiectasis reported on the CT scan  Is a former 20 pack smoker but who quit several decades ago.  He used to work for [employer] but he is now retired.  He is quite active he.  He bikes a road bike 2-3 times a week each x 7 miles.  He also plays golf on the cart.  For none of this he gets short of breath.  He only has a cough when he has sinus issues or sinus congestion.  He does suffer from chronic sinus congestion.  He says that he had a cardiac CT that then showed some nodules that then resulted in a high-res CT because of other findings in the lung.  He says there was concern of MAI infection [also confirmed external record review] he gave 2 sputum specimens but these were of poor quality.  Mostly he is asymptomatic and he does not have any sputum production.  The CT scan personally visualized.  Radiology is concerned that might be ILD evolving.  There is also lower lobe bronchiectasis.  There is no change in 1 year.  I personally visualized the CT scan and I agree there is ILA [interstitial lung abnormality] intermedius unchanged 1 year.  Her baby was male present 5 years ago.  He does have crackles on the exam.  No connective tissue disease symptoms.  His sit/stand test is normal.  CT scan reports enlarged pulmonary artery but his last echocardiogram was 5 years ago.  The most recent CT chest showed a small 4 mm lung nodule.    08/18/2024- Interim hx   Discussed the use of AI scribe software for clinical note transcription with the patient, who gave verbal consent to proceed.  History of Present Illness    OSA on CPAP Pulmonary fibrosis and bronchiectasis without complication, stable x 1 year. Maybe present 5 years ago. Risk for progression but not right now.   Left lower lobe pulmonary nodule, stopped smoking 20 years ago.  Enlarged pulmonary artery, need to rule out pulm HTN. Last echo 2020 Ordered for updated echocardiogram, serology and PFTs. Given ILD packet Due for HRCT in June 2026   Crackles on exam   SYMPTOM SCALE - ILD 03/17/2024  Current weight   O2 use ra  Shortness of Breath 0 -> 5 scale with 5 being worst (score 6 If unable to do)  At rest 0  Simple tasks - showers, clothes change, eating, shaving 0  Household (dishes, doing bed, laundry) 0  Shopping 0  Walking level at own pace 0  Walking up Stairs 1  Total (30-36) Dyspnea Score 1  How bad is your cough? 0  How bad is your fatigue 1  How bad is appetiee 0  How bad is nausea 0  How bad is vomiting?  00  How bad is diarrhea? 0  How bad is anxiety? 0  How  bad is depression 0  Any chronic pain - if so where and how bad 0     SIT STAND TEST - goal 15 times   03/17/2024    O2 used ra   PRobe - finter or forehead finger   Number sit and stand completed - goal 15 15   Time taken to complete 50 sec   Resting Pulse Ox/HR/Dyspnea  96% and 64/min and dyspnea of 0/10    Peak measures 95% and 80/min and dyspnea of 0/10   Final Pulse Ox/HR 95% and 68/min and dyspnea of 0/10   Desaturated </= 88% no   Desaturated <= 3% points no   Got Tachycardic >/= 90/min no   Miscellaneous comments x      Allergies[1]  Immunization History  Administered Date(s) Administered   DT (Pediatric) 07/22/2003, 12/28/2013   INFLUENZA, HIGH DOSE SEASONAL PF 04/08/2017, 05/17/2018, 04/14/2019, 05/28/2021, 04/22/2022, 04/16/2023   Influenza Split 04/14/2019   Influenza Whole  05/19/2013   Influenza,inj,Quad PF,6+ Mos 05/09/2014   Influenza-Unspecified 05/14/2015, 05/01/2016, 05/17/2018   PFIZER(Purple Top)SARS-COV-2 Vaccination 08/18/2019, 09/15/2019   Pneumococcal Conjugate-13 07/11/2014   Pneumococcal Polysaccharide-23 12/04/2009, 06/10/2016   Tdap 09/22/2013    Past Medical History:  Diagnosis Date   Bladder cancer (HCC)    Hearing loss of both ears    History of colon polyps    BENIGN   Hyperlipidemia    Hypertension    Mitral regurgitation    OSA on CPAP    PAD (peripheral artery disease)    ABI--  RIGHT SIDE NORMAL LEFT SIDE MODERATELY REDUCED W/ DISTAL LEFT SFA STENOSIS//  MILD CALDICATION   PAF (paroxysmal atrial fibrillation) (HCC)     a. s/p PVI 2014; b. s/p redo PVI and CTI 01/2015   Vitamin D  deficiency     Tobacco History: Tobacco Use History[2] Counseling given: Not Answered Tobacco comments: Former smoker 01/07/22   Outpatient Medications Prior to Visit  Medication Sig Dispense Refill   bisoprolol -hydrochlorothiazide  (ZIAC ) 10-6.25 MG tablet Take 0.5 tablets by mouth daily. 30 tablet 0   Cholecalciferol (VITAMIN D3) 125 MCG (5000 UT) CAPS Take 10,000 units Daily 30 capsule    diltiazem  (CARDIZEM ) 30 MG tablet Take 1 tablet every 4 hours AS NEEDED for AFIB heart rate >100 as long as top BP >100. 30 tablet 1   diltiazem  (CARTIA  XT) 180 MG 24 hr capsule Take 1 capsule (180 mg total) by mouth daily. 90 capsule 3   ezetimibe  (ZETIA ) 10 MG tablet TAKE 1 TABLET BY MOUTH DAILY FOR CHOLESTEROL 90 tablet 3   hydrocortisone  (ANUSOL -HC) 25 MG suppository Place 1 suppository rectally as needed.     Multiple Vitamin (MULTIVITAMIN WITH MINERALS) TABS tablet Take 1 tablet Daily     nitroGLYCERIN  (NITROSTAT ) 0.4 MG SL tablet DISSOLVE ONE TABLET UNDER THE TONGUE EVERY 5 MINUTES AS NEEDED FOR CHEST PAIN.DO NOT EXCEED A TOTAL OF 3 DOSES IN 15 MINUTES 25 tablet 6   NON FORMULARY Pt uses a c-pap nightly     Polyvinyl Alcohol-Povidone (REFRESH OP) Place  1 drop into both eyes 3 (three) times daily as needed (dry/irritated eyes.).     rosuvastatin  (CRESTOR ) 40 MG tablet TAKE ONE-HALF TABLET BY MOUTH  DAILY FOR CHOLESTEROL 45 tablet 3   XARELTO  20 MG TABS tablet TAKE 1 TABLET BY MOUTH DAILY  WITH SUPPER 90 tablet 3   No facility-administered medications prior to visit.      Review of Systems  Review of Systems   Physical Exam  There were no vitals taken for this visit. Physical Exam  ***  Lab Results:  CBC    Component Value Date/Time   WBC 6.3 05/31/2024 1622   WBC 7.4 03/17/2024 1444   RBC 4.67 05/31/2024 1622   RBC 4.87 03/17/2024 1444   HGB 13.2 05/31/2024 1622   HCT 40.3 05/31/2024 1622   PLT 345 05/31/2024 1622   MCV 86 05/31/2024 1622   MCH 28.3 05/31/2024 1622   MCH 30.8 07/29/2023 1502   MCHC 32.8 05/31/2024 1622   MCHC 34.4 03/17/2024 1444   RDW 14.7 05/31/2024 1622   LYMPHSABS 1.3 03/17/2024 1444   LYMPHSABS 1.5 02/25/2024 0819   MONOABS 0.6 03/17/2024 1444   EOSABS 0.2 03/17/2024 1444   EOSABS 0.3 02/25/2024 0819   BASOSABS 0.0 03/17/2024 1444   BASOSABS 0.0 02/25/2024 0819    BMET    Component Value Date/Time   NA 142 05/31/2024 1622   K 4.2 05/31/2024 1622   CL 105 05/31/2024 1622   CO2 28 05/31/2024 1622   GLUCOSE 89 05/31/2024 1622   GLUCOSE 85 07/29/2023 1502   BUN 22 05/31/2024 1622   CREATININE 0.96 05/31/2024 1622   CREATININE 0.81 07/29/2023 1502   CALCIUM  9.7 05/31/2024 1622   GFRNONAA >60 11/06/2022 0839   GFRNONAA 87 11/29/2020 1113   GFRAA 101 11/29/2020 1113    BNP No results found for: BNP  ProBNP No results found for: PROBNP  Imaging: No results found.   Assessment & Plan:   No problem-specific Assessment & Plan notes found for this encounter.   1. Pulmonary nodule (Primary)   Assessment and Plan Assessment & Plan       I personally spent a total of *** minutes in the care of the patient today including {Time Based  Coding:[id number]}.   Almarie LELON Ferrari, NP 08/17/2024    [1] No Known Allergies [2]  Social History Tobacco Use  Smoking Status Former   Current packs/day: 0.00   Average packs/day: 2.0 packs/day for 10.0 years (20.0 ttl pk-yrs)   Types: Cigarettes   Start date: 09/21/1971   Quit date: 09/20/1981   Years since quitting: 42.9  Smokeless Tobacco Never  Tobacco Comments   Former smoker 01/07/22   "

## 2024-08-18 ENCOUNTER — Encounter: Payer: Self-pay | Admitting: Primary Care

## 2024-08-18 ENCOUNTER — Ambulatory Visit

## 2024-08-18 ENCOUNTER — Telehealth: Payer: Self-pay | Admitting: Primary Care

## 2024-08-18 ENCOUNTER — Ambulatory Visit: Admitting: Primary Care

## 2024-08-18 VITALS — BP 138/62 | HR 87 | Temp 97.7°F | Ht 69.0 in | Wt 206.0 lb

## 2024-08-18 DIAGNOSIS — J8410 Pulmonary fibrosis, unspecified: Secondary | ICD-10-CM | POA: Diagnosis not present

## 2024-08-18 DIAGNOSIS — I288 Other diseases of pulmonary vessels: Secondary | ICD-10-CM

## 2024-08-18 DIAGNOSIS — R918 Other nonspecific abnormal finding of lung field: Secondary | ICD-10-CM

## 2024-08-18 DIAGNOSIS — Z87891 Personal history of nicotine dependence: Secondary | ICD-10-CM | POA: Diagnosis not present

## 2024-08-18 DIAGNOSIS — J479 Bronchiectasis, uncomplicated: Secondary | ICD-10-CM

## 2024-08-18 DIAGNOSIS — R911 Solitary pulmonary nodule: Secondary | ICD-10-CM | POA: Diagnosis not present

## 2024-08-18 DIAGNOSIS — R0989 Other specified symptoms and signs involving the circulatory and respiratory systems: Secondary | ICD-10-CM

## 2024-08-18 LAB — PULMONARY FUNCTION TEST
DL/VA % pred: 103 %
DL/VA: 4.08 ml/min/mmHg/L
DLCO unc % pred: 106 %
DLCO unc: 24.64 ml/min/mmHg
FEF 25-75 Post: 2.91 L/s
FEF 25-75 Pre: 2.38 L/s
FEF2575-%Change-Post: 22 %
FEF2575-%Pred-Post: 156 %
FEF2575-%Pred-Pre: 127 %
FEV1-%Change-Post: 5 %
FEV1-%Pred-Post: 114 %
FEV1-%Pred-Pre: 109 %
FEV1-Post: 3.09 L
FEV1-Pre: 2.94 L
FEV1FVC-%Change-Post: 1 %
FEV1FVC-%Pred-Pre: 105 %
FEV6-%Change-Post: 3 %
FEV6-%Pred-Post: 113 %
FEV6-%Pred-Pre: 109 %
FEV6-Post: 4 L
FEV6-Pre: 3.87 L
FEV6FVC-%Change-Post: 0 %
FEV6FVC-%Pred-Post: 106 %
FEV6FVC-%Pred-Pre: 106 %
FVC-%Change-Post: 3 %
FVC-%Pred-Post: 106 %
FVC-%Pred-Pre: 102 %
FVC-Post: 4.04 L
FVC-Pre: 3.88 L
Post FEV1/FVC ratio: 77 %
Post FEV6/FVC ratio: 99 %
Pre FEV1/FVC ratio: 76 %
Pre FEV6/FVC Ratio: 100 %
RV % pred: 107 %
RV: 2.74 L
TLC % pred: 106 %
TLC: 7.12 L

## 2024-08-18 NOTE — Progress Notes (Signed)
 Full pft performed today

## 2024-08-18 NOTE — Telephone Encounter (Signed)
 Can we mail ILD packet

## 2024-08-18 NOTE — Patient Instructions (Addendum)
" °  VISIT SUMMARY: THEOPHILUS WALZ, a 79 year old male with a history of pulmonary fibrosis and bronchiectasis, visited for a follow-up on lung scarring and nodules. His condition has been stable for the past year, and he remains active without experiencing shortness of breath. Recent tests, including a cardiac echocardiogram and lung function tests, showed normal results. He has no respiratory symptoms and continues to manage his condition well.  YOUR PLAN: -PULMONARY FIBROSIS AND BRONCHIECTASIS: Pulmonary fibrosis is a condition where lung tissue becomes scarred and stiff, while bronchiectasis involves the widening and damage of the airways. Your condition has been stable with no progression, and your lung function is normal. We will continue to monitor your condition with a repeat CT scan in June 2026 and annual lung function tests. Please maintain your physical activity and watch for any new respiratory symptoms.  -SOLITARY PULMONARY NODULE, LEFT LOWER LOBE: A solitary pulmonary nodule is a small, round growth in the lung. The nodule in your left lower lobe was found incidentally and shows no signs of malignancy. We will continue to monitor it with a repeat CT scan in June 2026. Please report any new respiratory symptoms such as cough, fever, or changes in breathing.  INSTRUCTIONS: Please follow up with a repeat CT scan in June 2026 and annual lung function tests. Maintain your physical activity and monitor for any new respiratory symptoms. Report any new symptoms such as cough, fever, or changes in breathing.  Follow-up 6 months with Dr. Geronimo to review CT results   Contains text generated by [employer].   "

## 2024-08-18 NOTE — Patient Instructions (Signed)
 Full pft performed today

## 2024-08-19 NOTE — Telephone Encounter (Signed)
 Completed. NFN

## 2024-08-22 ENCOUNTER — Ambulatory Visit (HOSPITAL_COMMUNITY)
Admission: RE | Admit: 2024-08-22 | Discharge: 2024-08-22 | Disposition: A | Source: Ambulatory Visit | Attending: Internal Medicine | Admitting: Internal Medicine

## 2024-08-22 VITALS — BP 138/80 | HR 83 | Wt 208.0 lb

## 2024-08-22 DIAGNOSIS — I4819 Other persistent atrial fibrillation: Secondary | ICD-10-CM

## 2024-08-22 DIAGNOSIS — I4891 Unspecified atrial fibrillation: Secondary | ICD-10-CM | POA: Insufficient documentation

## 2024-08-22 DIAGNOSIS — I480 Paroxysmal atrial fibrillation: Secondary | ICD-10-CM | POA: Diagnosis not present

## 2024-08-22 DIAGNOSIS — D6869 Other thrombophilia: Secondary | ICD-10-CM

## 2024-08-22 LAB — CBC
Hematocrit: 46.3 % (ref 37.5–51.0)
Hemoglobin: 15.6 g/dL (ref 13.0–17.7)
MCH: 30.6 pg (ref 26.6–33.0)
MCHC: 33.7 g/dL (ref 31.5–35.7)
MCV: 91 fL (ref 79–97)
Platelets: 229 10*3/uL (ref 150–450)
RBC: 5.1 x10E6/uL (ref 4.14–5.80)
RDW: 14.3 % (ref 11.6–15.4)
WBC: 8.8 10*3/uL (ref 3.4–10.8)

## 2024-08-22 LAB — BASIC METABOLIC PANEL WITH GFR
BUN/Creatinine Ratio: 21 (ref 10–24)
BUN: 22 mg/dL (ref 8–27)
CO2: 25 mmol/L (ref 20–29)
Calcium: 9.6 mg/dL (ref 8.6–10.2)
Chloride: 104 mmol/L (ref 96–106)
Creatinine, Ser: 1.03 mg/dL (ref 0.76–1.27)
Glucose: 92 mg/dL (ref 70–99)
Potassium: 4.2 mmol/L (ref 3.5–5.2)
Sodium: 141 mmol/L (ref 134–144)
eGFR: 74 mL/min/{1.73_m2}

## 2024-08-23 ENCOUNTER — Ambulatory Visit (HOSPITAL_COMMUNITY)
Admission: RE | Admit: 2024-08-23 | Discharge: 2024-08-23 | Disposition: A | Attending: Internal Medicine | Admitting: Internal Medicine

## 2024-08-23 ENCOUNTER — Other Ambulatory Visit: Payer: Self-pay

## 2024-08-23 ENCOUNTER — Ambulatory Visit (HOSPITAL_COMMUNITY): Admitting: Anesthesiology

## 2024-08-23 ENCOUNTER — Ambulatory Visit (HOSPITAL_COMMUNITY): Payer: Self-pay | Admitting: Internal Medicine

## 2024-08-23 ENCOUNTER — Encounter (HOSPITAL_COMMUNITY): Admission: RE | Disposition: A | Payer: Self-pay | Attending: Internal Medicine

## 2024-08-23 DIAGNOSIS — I10 Essential (primary) hypertension: Secondary | ICD-10-CM | POA: Insufficient documentation

## 2024-08-23 DIAGNOSIS — Z7901 Long term (current) use of anticoagulants: Secondary | ICD-10-CM | POA: Insufficient documentation

## 2024-08-23 DIAGNOSIS — G4733 Obstructive sleep apnea (adult) (pediatric): Secondary | ICD-10-CM | POA: Insufficient documentation

## 2024-08-23 DIAGNOSIS — I4819 Other persistent atrial fibrillation: Secondary | ICD-10-CM | POA: Insufficient documentation

## 2024-08-23 DIAGNOSIS — I2510 Atherosclerotic heart disease of native coronary artery without angina pectoris: Secondary | ICD-10-CM | POA: Insufficient documentation

## 2024-08-23 DIAGNOSIS — D6869 Other thrombophilia: Secondary | ICD-10-CM | POA: Insufficient documentation

## 2024-08-23 DIAGNOSIS — I739 Peripheral vascular disease, unspecified: Secondary | ICD-10-CM | POA: Insufficient documentation

## 2024-08-23 DIAGNOSIS — I4892 Unspecified atrial flutter: Secondary | ICD-10-CM | POA: Insufficient documentation

## 2024-08-23 DIAGNOSIS — Z79899 Other long term (current) drug therapy: Secondary | ICD-10-CM | POA: Insufficient documentation

## 2024-08-23 MED ORDER — PROPOFOL 10 MG/ML IV BOLUS
INTRAVENOUS | Status: DC | PRN
  Administered 2024-08-23: 40 mg via INTRAVENOUS

## 2024-08-23 MED ORDER — SODIUM CHLORIDE 0.9 % IV SOLN: INTRAVENOUS | Status: DC

## 2024-08-23 NOTE — Discharge Instructions (Signed)
 Electrical Cardioversion Electrical cardioversion is the delivery of a jolt of electricity to restore a normal rhythm to the heart. A rhythm that is too fast or is not regular keeps the heart from pumping well. In this procedure, sticky patches or metal paddles are placed on the chest to deliver electricity to the heart from a device. This procedure may be done in an emergency if: There is low or no blood pressure as a result of the heart rhythm. Normal rhythm must be restored as fast as possible to protect the brain and heart from further damage. It may save a life. This may also be a scheduled procedure for irregular or fast heart rhythms that are not immediately life-threatening.  What can I expect after the procedure? Your blood pressure, heart rate, breathing rate, and blood oxygen level will be monitored until you leave the hospital or clinic. Your heart rhythm will be watched to make sure it does not change. You may have some redness on the skin where the shocks were given. Over the counter cortizone cream may be helpful.  Follow these instructions at home: Do not drive for 24 hours if you were given a sedative during your procedure. Take over-the-counter and prescription medicines only as told by your health care provider. Ask your health care provider how to check your pulse. Check it often. Rest for 48 hours after the procedure or as told by your health care provider. Avoid or limit your caffeine use as told by your health care provider. Keep all follow-up visits as told by your health care provider. This is important. Contact a health care provider if: You feel like your heart is beating too quickly or your pulse is not regular. You have a serious muscle cramp that does not go away. Get help right away if: You have discomfort in your chest. You are dizzy or you feel faint. You have trouble breathing or you are short of breath. Your speech is slurred. You have trouble moving an  arm or leg on one side of your body. Your fingers or toes turn cold or blue. Summary Electrical cardioversion is the delivery of a jolt of electricity to restore a normal rhythm to the heart. This procedure may be done right away in an emergency or may be a scheduled procedure if the condition is not an emergency. Generally, this is a safe procedure. After the procedure, check your pulse often as told by your health care provider. This information is not intended to replace advice given to you by your health care provider. Make sure you discuss any questions you have with your health care provider. Document Revised: 02/07/2019 Document Reviewed: 02/07/2019 Elsevier Patient EducatiElectrical Cardioversion Electrical cardioversion is the delivery of a jolt of electricity to restore a normal rhythm to the heart. A rhythm that is too fast or is not regular (arrhythmia) keeps the heart from pumping blood well. There is also another type of cardioversion called a chemical (pharmacologic) cardioversion. This is when your health care provider gives you one or more medicines to bring back your regular heart rhythm. Electrical cardioversion is done as a scheduled procedure for arrhythmiasthat are not life-threatening. Electrical cardioversion may also be done in an emergency for sudden life-threatening arrhythmias. Tell a health care provider about: Any allergies you have. All medicines you are taking, including vitamins, herbs, eye drops, creams, and over-the-counter medicines. Any problems you or family members have had with sedatives or anesthesia. Any bleeding problems you have. Any surgeries you  have had, including a pacemaker, defibrillator, or other implanted device. Any medical conditions you have. Whether you are pregnant or may be pregnant. What are the risks? Your provider will talk with you about risks. These include: Allergic reactions to medicines. Irritation to the skin on your chest or  back where the sticky pads (electrodes) or paddles were put during electrical cardioversion. A blood clot that breaks free and travels to other parts of your body, such as your brain. Return of a worse abnormal heart rhythm that will need to be treated with medicines, a pacemaker, or an implantable cardioverter defibrillator (ICD). What happens before the procedure? Medicines Your provider may give you: Blood-thinning medicines (anticoagulants) so your blood does not clot as easily. If your provider gives you this medicine, you may need to take it for 4 weeks before the procedure. Medicines to help stabilize your heart rate and rhythm. Ask your provider about: Changing or stopping your regular medicines. These include any diabetes medicines or blood thinners you take. Taking medicines such as aspirin and ibuprofen. These medicines can thin your blood. Do not take them unless your provider tells you to. Taking over-the-counter medicines, vitamins, herbs, and supplements. General instructions Follow instructions from your provider about what you may eat and drink. Do not put any lotions, powders, or ointments on your chest and back for 24 hours before the procedure. They can cause problems with the electrodes or paddles used to deliver electricity to your heart. Do not wear jewelry as this can interfere with delivering electricity to your heart. If you will be going home right after the procedure, plan to have a responsible adult: Take you home from the hospital or clinic. You will not be allowed to drive. Care for you for the time you are told. Tests You may have an exam or testing. This may include: Blood labs. A transesophageal echocardiogram (TEE). What happens during the procedure?     An IV will be inserted into one of your veins. You will be given a sedative. This helps you relax. Electrodes or metal paddles will be placed on your chest. They may be placed in one of these ways: One  placed on your right chest, the other on the left ribs. One placed on your chest and the other on your back. An electrical shock will be delivered. The shock briefly stops (resets) your heart rhythm. Your provider will check to see if your heart rhythm is now normal. Some people need only one shock. Some need more to restore a normal heart rhythm. The procedure may vary among providers and hospitals. What happens after the procedure? Your blood pressure, heart rate, breathing rate, and blood oxygen level will be monitored until you leave the hospital or clinic. Your heart rhythm will be watched to make sure it does not change. This information is not intended to replace advice given to you by your health care provider. Make sure you discuss any questions you have with your health care provider. Document Revised: 02/27/2022 Document Reviewed: 02/27/2022 Elsevier Patient Education  2024 Else[employer]on  2020 [employer].

## 2024-08-23 NOTE — Anesthesia Preprocedure Evaluation (Signed)
 "                                  Anesthesia Evaluation  Patient identified by MRN, date of birth, ID band Patient awake    Reviewed: Allergy & Precautions, NPO status , Patient's Chart, lab work & pertinent test results  History of Anesthesia Complications Negative for: history of anesthetic complications  Airway Mallampati: I  TM Distance: >3 FB Neck ROM: Full    Dental  (+) Teeth Intact, Dental Advisory Given   Pulmonary neg shortness of breath, sleep apnea and Continuous Positive Airway Pressure Ventilation , neg COPD, neg recent URI, former smoker   breath sounds clear to auscultation       Cardiovascular hypertension, Pt. on medications and Pt. on home beta blockers (-) angina + CAD and + Peripheral Vascular Disease  + dysrhythmias Atrial Fibrillation  Rhythm:Irregular  1. Left ventricular ejection fraction, by estimation, is 60 to 65%. Left  ventricular ejection fraction by 3D volume is 67 %. The left ventricle has  normal function. The left ventricle has no regional wall motion  abnormalities. Left ventricular diastolic   parameters are indeterminate. The average left ventricular global  longitudinal strain is -24.7 %. The global longitudinal strain is normal.   2. Right ventricular systolic function is normal. The right ventricular  size is normal. There is normal pulmonary artery systolic pressure. The  estimated right ventricular systolic pressure is 29.4 mmHg.   3. Left atrial size was mild to moderately dilated.   4. The mitral valve is normal in structure. No evidence of mitral valve  regurgitation. No evidence of mitral stenosis.   5. The aortic valve is normal in structure. Aortic valve regurgitation is  not visualized. No aortic stenosis is present.   6. Aortic dilatation noted. There is mild dilatation of the ascending  aorta, measuring 39 mm.   7. The inferior vena cava is normal in size with greater than 50%  respiratory variability,  suggesting right atrial pressure of 3 mmHg.     Neuro/Psych negative neurological ROS  negative psych ROS   GI/Hepatic Neg liver ROS,GERD  ,,  Endo/Other  negative endocrine ROS    Renal/GU negative Renal ROS     Musculoskeletal negative musculoskeletal ROS (+)    Abdominal   Peds  Hematology  (+) Blood dyscrasia Lab Results      Component                Value               Date                      WBC                      8.8                 08/22/2024                HGB                      15.6                08/22/2024                HCT  46.3                08/22/2024                MCV                      91                  08/22/2024                PLT                      229                 08/22/2024            xarelto    Anesthesia Other Findings   Reproductive/Obstetrics                              Anesthesia Physical Anesthesia Plan  ASA: 2  Anesthesia Plan: General   Post-op Pain Management: Minimal or no pain anticipated   Induction: Intravenous  PONV Risk Score and Plan: 2 and Treatment may vary due to age or medical condition  Airway Management Planned: Nasal Cannula, Natural Airway and Simple Face Mask  Additional Equipment: None  Intra-op Plan:   Post-operative Plan:   Informed Consent: I have reviewed the patients History and Physical, chart, labs and discussed the procedure including the risks, benefits and alternatives for the proposed anesthesia with the patient or authorized representative who has indicated his/her understanding and acceptance.     Dental advisory given  Plan Discussed with: CRNA  Anesthesia Plan Comments:          Anesthesia Quick Evaluation  "

## 2024-08-23 NOTE — Transfer of Care (Signed)
 Immediate Anesthesia Transfer of Care Note  Patient: Damon [name]  Procedure(s) Performed: CARDIOVERSION  Patient Location: PACU and Cath Lab  Anesthesia Type:General  Level of Consciousness: awake and drowsy  Airway & Oxygen Therapy: Patient Spontanous Breathing and Patient connected to nasal cannula oxygen  Post-op Assessment: Report given to RN and Post -op Vital signs reviewed and stable  Post vital signs: Reviewed and stable  Last Vitals:  Vitals Value Taken Time  BP    Temp    Pulse    Resp    SpO2      Last Pain:  Vitals:   08/23/24 1010  TempSrc: Temporal         Complications: No notable events documented.

## 2024-08-23 NOTE — CV Procedure (Signed)
 Post Cardioversion Procedure Note  Procedure: Electrical Cardioversion Indications:  Atrial Fibrillation  Procedure Details:  Consent: Risks of procedure as well as the alternatives and risks of each were explained to the (patient/caregiver).  Consent for procedure obtained.  Time Out: Verified patient identification, verified procedure, site/side was marked, verified correct patient position, special equipment/implants available, medications/allergies/relevent history reviewed, required imaging and test results available.  Performed  Patient placed on cardiac monitor, pulse oximetry, supplemental oxygen as necessary.  Sedation given by anesthesia team.  Pacer pads placed anterior and posterior chest.  Cardioverted 1 time(s).  Cardioversion with synchronized biphasic 200J shock.  Evaluation: Findings: Post procedure EKG shows: NSR Complications: None Patient did tolerate procedure well.  Time Spent Directly with the Patient:  28 minutes   Damon [name] Calender 08/23/2024, 11:19 AM

## 2024-08-24 ENCOUNTER — Encounter (HOSPITAL_COMMUNITY): Payer: Self-pay | Admitting: Internal Medicine

## 2024-08-30 ENCOUNTER — Other Ambulatory Visit

## 2024-09-02 ENCOUNTER — Ambulatory Visit: Admitting: Family Medicine

## 2024-09-06 ENCOUNTER — Ambulatory Visit (HOSPITAL_COMMUNITY): Admitting: Physician Assistant

## 2024-09-06 ENCOUNTER — Ambulatory Visit: Admitting: Family Medicine

## 2025-01-05 ENCOUNTER — Ambulatory Visit (HOSPITAL_COMMUNITY): Admitting: Physician Assistant
